# Patient Record
Sex: Female | Born: 1935 | Race: Black or African American | Hispanic: No | Smoking: Never smoker
Health system: Southern US, Community
[De-identification: ages and names within clinical notes are randomized; demographics above are authoritative.]

## PROBLEM LIST (undated history)

## (undated) DIAGNOSIS — F329 Major depressive disorder, single episode, unspecified: Secondary | ICD-10-CM

## (undated) DIAGNOSIS — M109 Gout, unspecified: Secondary | ICD-10-CM

## (undated) DIAGNOSIS — G8929 Other chronic pain: Secondary | ICD-10-CM

## (undated) DIAGNOSIS — I482 Chronic atrial fibrillation, unspecified: Secondary | ICD-10-CM

## (undated) DIAGNOSIS — K219 Gastro-esophageal reflux disease without esophagitis: Secondary | ICD-10-CM

## (undated) DIAGNOSIS — D649 Anemia, unspecified: Secondary | ICD-10-CM

## (undated) DIAGNOSIS — J449 Chronic obstructive pulmonary disease, unspecified: Secondary | ICD-10-CM

## (undated) DIAGNOSIS — I4891 Unspecified atrial fibrillation: Secondary | ICD-10-CM

## (undated) DIAGNOSIS — G9341 Metabolic encephalopathy: Secondary | ICD-10-CM

## (undated) DIAGNOSIS — N390 Urinary tract infection, site not specified: Secondary | ICD-10-CM

## (undated) DIAGNOSIS — N183 Chronic kidney disease, stage 3 unspecified: Secondary | ICD-10-CM

## (undated) DIAGNOSIS — T4145XA Adverse effect of unspecified anesthetic, initial encounter: Secondary | ICD-10-CM

## (undated) DIAGNOSIS — I10 Essential (primary) hypertension: Secondary | ICD-10-CM

## (undated) DIAGNOSIS — M545 Low back pain, unspecified: Secondary | ICD-10-CM

## (undated) DIAGNOSIS — K449 Diaphragmatic hernia without obstruction or gangrene: Secondary | ICD-10-CM

## (undated) DIAGNOSIS — M1990 Unspecified osteoarthritis, unspecified site: Secondary | ICD-10-CM

## (undated) DIAGNOSIS — F419 Anxiety disorder, unspecified: Secondary | ICD-10-CM

## (undated) DIAGNOSIS — G4733 Obstructive sleep apnea (adult) (pediatric): Secondary | ICD-10-CM

## (undated) DIAGNOSIS — W19XXXA Unspecified fall, initial encounter: Secondary | ICD-10-CM

## (undated) DIAGNOSIS — Z9989 Dependence on other enabling machines and devices: Secondary | ICD-10-CM

## (undated) DIAGNOSIS — S82209A Unspecified fracture of shaft of unspecified tibia, initial encounter for closed fracture: Secondary | ICD-10-CM

## (undated) DIAGNOSIS — E119 Type 2 diabetes mellitus without complications: Secondary | ICD-10-CM

## (undated) DIAGNOSIS — J90 Pleural effusion, not elsewhere classified: Secondary | ICD-10-CM

## (undated) DIAGNOSIS — I5032 Chronic diastolic (congestive) heart failure: Secondary | ICD-10-CM

## (undated) DIAGNOSIS — I509 Heart failure, unspecified: Secondary | ICD-10-CM

## (undated) DIAGNOSIS — N19 Unspecified kidney failure: Secondary | ICD-10-CM

## (undated) DIAGNOSIS — F32A Depression, unspecified: Secondary | ICD-10-CM

## (undated) DIAGNOSIS — Z9289 Personal history of other medical treatment: Secondary | ICD-10-CM

## (undated) DIAGNOSIS — J9690 Respiratory failure, unspecified, unspecified whether with hypoxia or hypercapnia: Secondary | ICD-10-CM

## (undated) DIAGNOSIS — T8859XA Other complications of anesthesia, initial encounter: Secondary | ICD-10-CM

## (undated) HISTORY — DX: Essential (primary) hypertension: I10

## (undated) HISTORY — DX: Chronic kidney disease, stage 3 (moderate): N18.3

## (undated) HISTORY — DX: Dependence on other enabling machines and devices: Z99.89

## (undated) HISTORY — PX: JOINT REPLACEMENT: SHX530

## (undated) HISTORY — DX: Unspecified atrial fibrillation: I48.91

## (undated) HISTORY — DX: Diaphragmatic hernia without obstruction or gangrene: K44.9

## (undated) HISTORY — PX: BUNIONECTOMY: SHX129

## (undated) HISTORY — PX: CHOLECYSTECTOMY: SHX55

## (undated) HISTORY — DX: Unspecified kidney failure: N19

## (undated) HISTORY — DX: Chronic kidney disease, stage 3 unspecified: N18.30

## (undated) HISTORY — DX: Obstructive sleep apnea (adult) (pediatric): G47.33

## (undated) HISTORY — DX: Gastro-esophageal reflux disease without esophagitis: K21.9

---

## 1999-11-05 HISTORY — PX: TOTAL KNEE ARTHROPLASTY: SHX125

## 2000-11-04 HISTORY — PX: SHOULDER OPEN ROTATOR CUFF REPAIR: SHX2407

## 2000-11-04 HISTORY — PX: SHOULDER ARTHROSCOPY W/ ROTATOR CUFF REPAIR: SHX2400

## 2002-11-04 HISTORY — PX: HAMMER TOE SURGERY: SHX385

## 2007-10-01 ENCOUNTER — Ambulatory Visit: Payer: Self-pay | Admitting: Cardiology

## 2007-10-09 ENCOUNTER — Ambulatory Visit: Payer: Self-pay | Admitting: Cardiology

## 2007-10-14 ENCOUNTER — Ambulatory Visit: Payer: Self-pay | Admitting: Cardiology

## 2007-10-22 ENCOUNTER — Ambulatory Visit: Payer: Self-pay | Admitting: Cardiology

## 2007-11-10 ENCOUNTER — Ambulatory Visit: Payer: Self-pay | Admitting: Cardiology

## 2007-11-17 ENCOUNTER — Encounter: Payer: Self-pay | Admitting: Pulmonary Disease

## 2007-12-03 ENCOUNTER — Ambulatory Visit: Payer: Self-pay | Admitting: Cardiology

## 2007-12-24 ENCOUNTER — Ambulatory Visit: Payer: Self-pay | Admitting: Cardiology

## 2008-01-21 ENCOUNTER — Ambulatory Visit: Payer: Self-pay | Admitting: Cardiology

## 2008-02-24 ENCOUNTER — Ambulatory Visit: Payer: Self-pay | Admitting: Cardiology

## 2008-04-05 ENCOUNTER — Ambulatory Visit: Payer: Self-pay | Admitting: Cardiology

## 2008-04-12 ENCOUNTER — Ambulatory Visit: Payer: Self-pay | Admitting: Cardiology

## 2008-04-26 ENCOUNTER — Ambulatory Visit: Payer: Self-pay | Admitting: Cardiology

## 2008-06-10 ENCOUNTER — Ambulatory Visit: Payer: Self-pay | Admitting: Cardiology

## 2008-06-28 ENCOUNTER — Ambulatory Visit: Payer: Self-pay | Admitting: Cardiology

## 2008-07-19 ENCOUNTER — Ambulatory Visit: Payer: Self-pay | Admitting: Cardiology

## 2008-07-20 ENCOUNTER — Ambulatory Visit: Payer: Self-pay | Admitting: Cardiology

## 2008-07-27 ENCOUNTER — Ambulatory Visit: Payer: Self-pay | Admitting: Cardiology

## 2008-08-11 ENCOUNTER — Ambulatory Visit: Payer: Self-pay | Admitting: Cardiology

## 2008-08-19 ENCOUNTER — Ambulatory Visit: Payer: Self-pay | Admitting: Pulmonary Disease

## 2008-08-19 DIAGNOSIS — E119 Type 2 diabetes mellitus without complications: Secondary | ICD-10-CM

## 2008-08-19 DIAGNOSIS — Z8679 Personal history of other diseases of the circulatory system: Secondary | ICD-10-CM

## 2008-08-19 DIAGNOSIS — K219 Gastro-esophageal reflux disease without esophagitis: Secondary | ICD-10-CM

## 2008-08-19 DIAGNOSIS — I10 Essential (primary) hypertension: Secondary | ICD-10-CM | POA: Insufficient documentation

## 2008-08-19 DIAGNOSIS — F329 Major depressive disorder, single episode, unspecified: Secondary | ICD-10-CM

## 2008-08-19 DIAGNOSIS — J309 Allergic rhinitis, unspecified: Secondary | ICD-10-CM | POA: Insufficient documentation

## 2008-08-31 DIAGNOSIS — G4700 Insomnia, unspecified: Secondary | ICD-10-CM

## 2008-08-31 DIAGNOSIS — G4733 Obstructive sleep apnea (adult) (pediatric): Secondary | ICD-10-CM | POA: Insufficient documentation

## 2008-09-09 ENCOUNTER — Ambulatory Visit: Payer: Self-pay | Admitting: Cardiology

## 2008-09-20 ENCOUNTER — Ambulatory Visit: Payer: Self-pay | Admitting: Pulmonary Disease

## 2008-09-23 ENCOUNTER — Ambulatory Visit: Payer: Self-pay | Admitting: Cardiology

## 2008-09-28 ENCOUNTER — Ambulatory Visit: Payer: Self-pay | Admitting: Cardiology

## 2008-10-13 ENCOUNTER — Telehealth (INDEPENDENT_AMBULATORY_CARE_PROVIDER_SITE_OTHER): Payer: Self-pay | Admitting: *Deleted

## 2008-10-18 ENCOUNTER — Ambulatory Visit: Payer: Self-pay | Admitting: Cardiology

## 2008-11-09 ENCOUNTER — Telehealth: Payer: Self-pay | Admitting: Pulmonary Disease

## 2008-11-15 ENCOUNTER — Ambulatory Visit: Payer: Self-pay | Admitting: Cardiology

## 2008-11-25 ENCOUNTER — Ambulatory Visit: Payer: Self-pay | Admitting: Cardiology

## 2008-12-14 ENCOUNTER — Ambulatory Visit: Payer: Self-pay | Admitting: Cardiology

## 2008-12-30 ENCOUNTER — Ambulatory Visit: Payer: Self-pay | Admitting: Cardiology

## 2009-01-20 ENCOUNTER — Ambulatory Visit: Payer: Self-pay | Admitting: Cardiology

## 2009-02-17 ENCOUNTER — Encounter: Payer: Self-pay | Admitting: Cardiology

## 2009-02-24 ENCOUNTER — Ambulatory Visit: Payer: Self-pay | Admitting: Cardiology

## 2009-03-20 ENCOUNTER — Encounter: Payer: Self-pay | Admitting: Cardiology

## 2009-05-04 ENCOUNTER — Emergency Department (HOSPITAL_COMMUNITY): Admission: EM | Admit: 2009-05-04 | Discharge: 2009-05-04 | Payer: Self-pay | Admitting: Emergency Medicine

## 2009-06-19 ENCOUNTER — Encounter: Payer: Self-pay | Admitting: *Deleted

## 2009-07-27 ENCOUNTER — Encounter (INDEPENDENT_AMBULATORY_CARE_PROVIDER_SITE_OTHER): Payer: Self-pay | Admitting: Cardiology

## 2011-03-19 NOTE — Assessment & Plan Note (Signed)
[facility]                          [facility] OFFICE NOTE   NAME:Gina Ashley, Gina Ashley                        MRN:          [id number]  DATE:10/22/2007                            DOB:          10-01-1927    REFERRING PHYSICIAN:  Lia Hopping   HISTORY OF PRESENT ILLNESS:  The patient is a 77 year old female  recently admitted with viral gastroenteritis.  She developed atrial  fibrillation, was seen by Dr. Jens Som.  She has a history of paroxysmal  atrial fibrillation and was previously on sotalol.  The patient was  started on Coumadin during this hospitalization.  She was also given  Cardizem but I discontinued this due to her first degree AV block and  sinus bradycardia and did not want to combine this with a beta-blocker.  She is now, however, on amlodipine in the meanwhile for blood pressure  control.  She is doing quite well.  She has no chest pain, shortness of  breath, orthopnea and PND; she has no other complaints.  She is here  also to have her INR checked.   MEDICATIONS:  1. Sotalol 120 mg p.o. b.i.d.  2. Nexium 40 mg p.o. daily.  3. Lasix 40 mg p.o. daily.  4. Glucotrol 10 mg p.o. b.i.d.  5. Glucophage 500 mg p.o. b.i.d.  6. KCL 20 mg p.o. daily.  7. Zestril 20 mg p.o. daily.  8. Actonel 35 mg p.o. daily.  9. Detrol LA 4 mg p.o. daily.  10.Aspirin 81 mg p.o. daily.  11.Vicodin 5 mg p.o. b.i.d.  12.Coumadin as directed.  13.Lortab b.i.d.  14.Amlodipine 5 mg p.o. b.i.d.  15.INR is 2.   PHYSICAL EXAMINATION:  VITAL SIGNS:  Blood pressure is 138/61, heart  rate 65, weight is 220 pounds.  NECK EXAM:  Normal carotid upstroke, no carotid bruit.  LUNGS:  Clear.  HEART:  Regular rate and rhythm, normal S1 and S2.  ABDOMEN:  Soft.  EXTREMITY EXAM:  No cyanosis, clubbing or edema.  NEURO:  The patient alert, oriented, grossly nonfocal.   PROBLEMS:  1. Paroxysmal atrial fibrillation, normal sinus rhythm.  2. Chronic sotalol therapy.  3. Coumadin anticoagulation.  4. History of first degree atrioventricular block and sinus      bradycardia in combination with beta-blocker therapy, this has now      resolved, although there is still a prolonged PR.  5. Normal left ventricular function.  6. CHADS score of 3.   PLAN:  1. The patient can follow up with [place] in 6 months.  2. The patient will have an INR done today and if in therapeutic range      she can be extended to q.monthly.  3. No further ischemia workup is currently needed.     Learta Codding, MD,FACC  Electronically Signed    GED/MedQ  DD: 10/22/2007  DT: 10/23/2007  Job #: [id number]   cc:   Lia Hopping

## 2011-03-19 NOTE — Assessment & Plan Note (Signed)
[facility]                          [facility] OFFICE NOTE   NAME:Gina Ashley, KEYARI                        MRN:          [id number]  DATE:08/11/2008                            DOB:          06-12-1927    REFERRING PHYSICIAN:  Lia Hopping   REFERRING PHYSICIAN:  __________   HISTORY OF PRESENT ILLNESS:  The patient is a 77 year old female with a  history of paroxysmal atrial fibrillation on sotalol therapy.  She has  remained in normal sinus rhythm.  The patient also has severe sleep  apnea, for which she uses CPAP mask.  Unfortunately, the patient has  difficulty with the CPAP mask, and is unable to sleep no more than 2 to  at most 3 hours at night.  She suffered the rest of the night and is  very restless.  She also has extremely dry mouth and feels she cannot  tolerate the mask.  I have asked her what happens when she takes off the  mask, and she states that she done, she becomes extremely restless with  a sensation of thrashing around as well as vivid dreams and has, scared  feelings during the nighttime.  From a cardiovascular perspective,  however, the patient has remained stable.  Her blood pressure  unfortunately remains uncontrolled.   MEDICATIONS:  1. Sotalol 120 mg p.o. b.i.d.  2. Nexium 40 mg p.o. daily.  3. Lasix 40 mg 1 tablet in the morning and half in the evening.  4. Glucotrol 10 mg 1 tablet twice a day.  5. Glucophage XR 5 mg 2 tablets in the morning, 2 in the evening.  6. K-Dur 20 mEq 1 tablet twice a day.  7. Calcium with vitamin D.  8. Vitron-C.  9. Lisinopril 20 mg a day.  10.Amlodipine 5 mg twice a day.  11.Actonel.  12.Hydrocodone and warfarin as directed.   PHYSICAL EXAMINATION:  VITAL SIGNS:  Blood pressure is 174/83, heart  rate 69, weight is 224 pounds.  NECK:  Normal carotid upstroke, no carotid bruits.  LUNGS:  Clear breath sounds bilaterally.  HEART:  Regular rate and rhythm with a normal S1 and S2.  No  murmur,  rubs, or gallops.  ABDOMEN:  Soft, nontender.  No rebound or guarding.  Good bowel sounds.  EXTREMITIES:  No cyanosis, clubbing, or edema.  NEUROLOGIC:  The patient is alert, oriented grossly nonfocal.   PROBLEM LIST:  1. Paroxysmal atrial fibrillation.  Normal sinus rhythm on sotalol      therapy.  2. Chronic Coumadin therapy.  3. History of sinus bradycardia.  4. Normal left ventricular function.  5. CHADS score of 3.  6. Obstructive sleep apnea, poorly tolerated.  7. Hypertension, poorly controlled.   PLAN:  1. We will increase the patient's Lasix to 40 mg p.o. b.i.d.  She also      has some lower extremity edema.  2. Additionally, I increased the patient's lisinopril to 40 mg p.o. q.      daily.  She will also follow up blood pressure monitoring next      week. We will  check BMET together with an RN visit for a blood      pressure check.  3. I talked with Dr. Graciela Husbands about this patient.  I will refer her for      CPAP, but desensitization due to her difficulty that she is      encountering.     Learta Codding, MD,FACC  Electronically Signed    GED/MedQ  DD: 08/11/2008  DT: 08/11/2008  Job #: [id number]   cc:   Lia Hopping

## 2011-05-20 ENCOUNTER — Encounter (INDEPENDENT_AMBULATORY_CARE_PROVIDER_SITE_OTHER): Payer: Self-pay

## 2011-05-21 DIAGNOSIS — R0602 Shortness of breath: Secondary | ICD-10-CM

## 2011-05-21 DIAGNOSIS — I498 Other specified cardiac arrhythmias: Secondary | ICD-10-CM

## 2011-05-22 DIAGNOSIS — R072 Precordial pain: Secondary | ICD-10-CM

## 2011-05-22 DIAGNOSIS — I4891 Unspecified atrial fibrillation: Secondary | ICD-10-CM

## 2011-06-03 ENCOUNTER — Ambulatory Visit (INDEPENDENT_AMBULATORY_CARE_PROVIDER_SITE_OTHER): Payer: Medicare Other | Admitting: Internal Medicine

## 2011-06-03 ENCOUNTER — Encounter (INDEPENDENT_AMBULATORY_CARE_PROVIDER_SITE_OTHER): Payer: Self-pay | Admitting: Internal Medicine

## 2011-06-03 ENCOUNTER — Telehealth (INDEPENDENT_AMBULATORY_CARE_PROVIDER_SITE_OTHER): Payer: Self-pay | Admitting: *Deleted

## 2011-06-03 ENCOUNTER — Encounter (INDEPENDENT_AMBULATORY_CARE_PROVIDER_SITE_OTHER): Payer: Self-pay | Admitting: *Deleted

## 2011-06-03 VITALS — BP 142/58 | HR 64 | Temp 98.0°F | Ht 63.0 in | Wt 208.2 lb

## 2011-06-03 DIAGNOSIS — K635 Polyp of colon: Secondary | ICD-10-CM

## 2011-06-03 DIAGNOSIS — D649 Anemia, unspecified: Secondary | ICD-10-CM

## 2011-06-03 DIAGNOSIS — R195 Other fecal abnormalities: Secondary | ICD-10-CM

## 2011-06-03 DIAGNOSIS — Z800 Family history of malignant neoplasm of digestive organs: Secondary | ICD-10-CM

## 2011-06-03 DIAGNOSIS — D126 Benign neoplasm of colon, unspecified: Secondary | ICD-10-CM

## 2011-06-03 NOTE — Patient Instructions (Signed)
All questions were answered. The risks and benefits such as perforation, bleeding, and infection were reviewed with the patient and is agreeable.

## 2011-06-03 NOTE — Telephone Encounter (Signed)
Pt needs movi prep escribed  TCS sch'd 07/11/11 @ 3:00 (2:00), coumadin 5 days, iron 10 days  Pt given split dose movi prep

## 2011-06-03 NOTE — Progress Notes (Signed)
Subjective:     Patient ID: Gina Ashley, female   DOB: 12-09-1926, 77 y.o.   MRN: [id number]  HPI  Gina Ashley is an 77 yr old female referred to our office by Dr. Ernestine Conrad for heme positive stools.  Appetite is good. No weight loss. No dysphagia. She c/o left lower abdominal pain which she has had for over 20 yrs.  No rectal bleeding or melena. Stools are dark and normal size.  She is presently taking Vitron C 1 tab BID for anemia.  I could not list under medications.  She has a BM one to two a week  Gina Ashley was recently in the hospital at [facility] 05/22/11 for weakness, fatigue with associated bradycardia.  Admission Hemoglobin 10.7, hematocrit 32.8, MCV 93.5 PT/INR 26.3 and 2.4 on coumadin.Lexican Carioloyte study was negative for inducible ischemia. 2D echo showed an EF of 60-65%, normal LV function but with evidence of of significant pulmonary hypertension with an RV systolic pressure estimated at 65-71mm.   08/21/2007 Dr. Linna Darner Colonoscopy for surveillance, personal hx of colonic polyps. No diverticulosis. No colonic or rectal polyps. Retroflexed in the rectum revealed no abnormalities.  She tells me that she had a colonoscopy in 2010 by Dr. Linna Darner but I am unable to locate that report. I have called [facility] and talked with Diane and she says  It does not exist.  Review of Systems see hpi     Objective:   Physical Exam    Alert and oriented. Skin warm and dry. Oral mucosa is moist.  Upper dentures. Natural lower in fair condition. Sclera anicteric, conjunctivae is pink. Thyroid not enlarged. No cervical lymphadenopathy. Lungs clear. Heart regular rate and rhythm.  Abdomen is soft. Bowel sounds are positive. No hepatomegaly. No abdominal masses felt. No tenderness.Stool brown and guaiac positive.  1+ edema to lower extremities with compression hose in place.. Patient is alert and oriented.                                 Assessment:    Heme positive stools, anemia, family hx of colon cancer in a 1st  degree relative.  (brothers x 2).  Colnic neoplasm, polyp, AVM, hemorrhoid needs to be ruled out.      Plan:     Will schedule a colonoscopy with Dr. Karilyn Cota.    The risks and benefits such as perforation, bleeding, and infection were reviewed with the patient and is agreeable.

## 2011-06-04 MED ORDER — PEG-KCL-NACL-NASULF-NA ASC-C 100 G PO SOLR: 1.0000 | Freq: Once | ORAL | Status: DC

## 2011-06-05 ENCOUNTER — Encounter: Payer: Self-pay | Admitting: Cardiovascular Disease

## 2011-06-14 ENCOUNTER — Encounter: Payer: Self-pay | Admitting: Cardiovascular Disease

## 2011-06-14 ENCOUNTER — Ambulatory Visit (INDEPENDENT_AMBULATORY_CARE_PROVIDER_SITE_OTHER): Payer: Medicare Other | Admitting: Cardiovascular Disease

## 2011-06-14 VITALS — BP 146/83 | HR 58 | Resp 18 | Ht 65.0 in | Wt 212.8 lb

## 2011-06-14 DIAGNOSIS — I480 Paroxysmal atrial fibrillation: Secondary | ICD-10-CM | POA: Insufficient documentation

## 2011-06-14 DIAGNOSIS — I10 Essential (primary) hypertension: Secondary | ICD-10-CM | POA: Insufficient documentation

## 2011-06-14 DIAGNOSIS — I272 Pulmonary hypertension, unspecified: Secondary | ICD-10-CM | POA: Insufficient documentation

## 2011-06-14 DIAGNOSIS — I4891 Unspecified atrial fibrillation: Secondary | ICD-10-CM

## 2011-06-14 NOTE — Assessment & Plan Note (Signed)
Her echocardiogram showed evidence of moderate to severe pulmonary hypertension. I suspect that this is likely due to prolonged history of diastolic dysfunction as well as systemic hypertension. She does not seem to be significantly symptomatic. Given her age and minimal symptoms, I will not per pursue further evaluation of this.

## 2011-06-14 NOTE — Patient Instructions (Signed)
Continue all current medications. Your physician wants you to follow up in: 6 months.  You will receive a reminder letter in the mail one-two months in advance.  If you don't receive a letter, please call our office to schedule the follow up appointment   

## 2011-06-14 NOTE — Progress Notes (Signed)
HPI  This is an 77 year old female who is here today for a followup visit. She has prolonged history of atrial fibrillation as well as hypertension. She has been treated with sotalol as well as long-term anticoagulation with warfarin. She has been maintaining mostly in sinus rhythm. She was hospitalized recently at [facility] due to fatigue, dizziness and dyspnea. She was found to have significant bradycardia with heart rate occasionally going down to the 30s. She was taking sotalol 120 mg twice daily. She had gradual decline in her renal function and it was felt that the sotalol dose needed to be adjusted due to her renal failure. Thus, the dose was decreased to 40 mg twice daily. Her bradycardia resolved. She had no recurrent atrial fibrillation. She had an echocardiogram done which showed normal LV systolic function with evidence of moderate to severe pulmonary hypertension without significant valvular abnormalities. She did undergo a pharmacologic nuclear stress test which showed no evidence of ischemia.  Allergies  Allergen Reactions  . Ferrous Sulfate     REACTION: nausea  . Morphine   . Morphine Sulfate     REACTION: unspecified: change in personality  . Penicillins     REACTION: rash  . Percocet (Oxycodone-Acetaminophen)      Current Outpatient Prescriptions on File Prior to Visit  Medication Sig Dispense Refill  . amLODipine (NORVASC) 10 MG tablet Take 10 mg by mouth daily.        . cholestyramine (QUESTRAN) 4 GM/DOSE powder Take by mouth 3 (three) times daily with meals.        . cloNIDine (CATAPRES) 0.2 MG tablet Take 0.2 mg by mouth 2 (two) times daily.        . clotrimazole-betamethasone (LOTRISONE) cream Apply topically 2 (two) times daily.        . Cyanocobalamin (VITAMIN B 12 PO) Take by mouth.        . fish oil-omega-3 fatty acids 1000 MG capsule Take 2,400 mg by mouth daily.        . furosemide (LASIX) 80 MG tablet Take 40 mg by mouth daily.       Marland Kitchen gabapentin  (NEURONTIN) 300 MG capsule Take 300 mg by mouth 2 (two) times daily.       Marland Kitchen glipiZIDE (GLUCOTROL XL) 10 MG 24 hr tablet Take 10 mg by mouth daily.        Marland Kitchen HYDROcodone-acetaminophen (LORTAB) 10-500 MG per tablet Take 1 tablet by mouth every 6 (six) hours as needed.        Marland Kitchen lisinopril (PRINIVIL,ZESTRIL) 40 MG tablet Take 40 mg by mouth daily.        . meclizine (ANTIVERT) 50 MG tablet Take 25 mg by mouth as needed.        Marland Kitchen omeprazole (PRILOSEC) 40 MG capsule Take 40 mg by mouth daily.        . peg 3350 powder (MOVIPREP) 100 G SOLR Take 1 kit (100 g total) by mouth once.  1 kit  0  . potassium chloride (K-DUR) 10 MEQ tablet Take 10 mEq by mouth 2 (two) times daily.        . sotalol (BETAPACE) 120 MG tablet Take 40 mg by mouth 2 (two) times daily.       . traZODone (DESYREL) 50 MG tablet Take 50 mg by mouth at bedtime as needed.       . warfarin (COUMADIN) 5 MG tablet Take 5 mg by mouth daily.  Past Medical History  Diagnosis Date  . Diabetes mellitus     x 15 yrs  . GERD (gastroesophageal reflux disease)   . OSA on CPAP   . Back pain, chronic   . Pain in shoulder   . Pain in ankle   . Hiatal hernia   . Atrial fibrillation     since 1996  . HTN (hypertension)     all her life  . Renal failure      Past Surgical History  Procedure Date  . Bunionectomy     bilateral  . Cholecystectomy   . Rotator cuff repair     2002  . Total knee arthroplasty     bilateral 2001  . Hammer toe surgery     2004     History reviewed. No pertinent family history.   History   Social History  . Marital Status: Widowed    Spouse Name: N/A    Number of Children: N/A  . Years of Education: N/A   Occupational History  . Not on file.   Social History Main Topics  . Smoking status: Never Smoker   . Smokeless tobacco: Not on file  . Alcohol Use: No  . Drug Use: No  . Sexually Active: Not on file   Other Topics Concern  . Not on file   Social History Narrative  . No  narrative on file       PHYSICAL EXAM   BP 146/83  Pulse 58  Resp 18  Ht 5\' 5"  (1.651 m)  Wt 212 lb 12.8 oz (96.525 kg)  BMI 35.41 kg/m2  SpO2 90%  Constitutional: She is oriented to person, place, and time. She appears well-developed and well-nourished. No distress.  HENT: No nasal discharge.  Head: Normocephalic and atraumatic.  Eyes: Pupils are equal, round, and reactive to light. Right eye exhibits no discharge. Left eye exhibits no discharge.  Neck: Normal range of motion. Neck supple. No JVD present. No thyromegaly present.  Cardiovascular: Normal rate, regular rhythm, normal heart sounds and intact distal pulses. Exam reveals no gallop and no friction rub.  No murmur heard.  Pulmonary/Chest: Effort normal and breath sounds normal. No stridor. No respiratory distress. She has no wheezes. She has no rales. She exhibits no tenderness.  Abdominal: Soft. Bowel sounds are normal. She exhibits no distension. There is no tenderness. There is no rebound and no guarding.  Musculoskeletal: Normal range of motion. She exhibits +1 edema and no tenderness.  Neurological: She is alert and oriented to person, place, and time. Coordination normal.  Skin: Skin is warm and dry. No rash noted. She is not diaphoretic. No erythema. No pallor.  Psychiatric: She has a normal mood and affect. Her behavior is normal. Judgment and thought content normal.      ASSESSMENT AND PLAN

## 2011-06-14 NOTE — Assessment & Plan Note (Addendum)
The patient has persistent atrial fibrillation but has maintained mostly in sinus rhythm with sotalol. The bradycardia almost resolved after decreasing the dose to 40 mg twice daily. I reviewed her heart rate and blood pressure readings at home. The lowest heart rate was 47 beats per minute. Most of the time it's ranging from 55-70.  Continue current dose of sotalol as well as long-term anticoagulation with a goal INR between 2 and 3. This is being managed by Dr. Loney Hering.

## 2011-06-14 NOTE — Assessment & Plan Note (Signed)
Her blood pressure is reasonably controlled. Continue current medications. 

## 2011-07-10 MED ORDER — SODIUM CHLORIDE 0.45 % IV SOLN
Freq: Once | INTRAVENOUS | Status: AC
  Administered 2011-07-11: 14:00:00 via INTRAVENOUS

## 2011-07-11 ENCOUNTER — Encounter (HOSPITAL_COMMUNITY)
Admission: RE | Disposition: A | Discharge status: Home / Self Care | Payer: Self-pay | Source: Ambulatory Visit | Attending: Internal Medicine

## 2011-07-11 ENCOUNTER — Ambulatory Visit (HOSPITAL_COMMUNITY)
Admission: RE | Admit: 2011-07-11 | Discharge: 2011-07-11 | Disposition: A | Discharge status: Home / Self Care | Payer: Medicare Other | Source: Ambulatory Visit | Attending: Internal Medicine | Admitting: Internal Medicine

## 2011-07-11 ENCOUNTER — Encounter (HOSPITAL_COMMUNITY): Payer: Self-pay | Admitting: *Deleted

## 2011-07-11 ENCOUNTER — Other Ambulatory Visit (INDEPENDENT_AMBULATORY_CARE_PROVIDER_SITE_OTHER): Payer: Self-pay | Admitting: Internal Medicine

## 2011-07-11 DIAGNOSIS — Z01812 Encounter for preprocedural laboratory examination: Secondary | ICD-10-CM | POA: Insufficient documentation

## 2011-07-11 DIAGNOSIS — D126 Benign neoplasm of colon, unspecified: Secondary | ICD-10-CM | POA: Insufficient documentation

## 2011-07-11 DIAGNOSIS — Z800 Family history of malignant neoplasm of digestive organs: Secondary | ICD-10-CM | POA: Insufficient documentation

## 2011-07-11 DIAGNOSIS — Z79899 Other long term (current) drug therapy: Secondary | ICD-10-CM | POA: Insufficient documentation

## 2011-07-11 DIAGNOSIS — K921 Melena: Secondary | ICD-10-CM

## 2011-07-11 DIAGNOSIS — Z860100 Personal history of colon polyps, unspecified: Secondary | ICD-10-CM | POA: Insufficient documentation

## 2011-07-11 DIAGNOSIS — I4891 Unspecified atrial fibrillation: Secondary | ICD-10-CM | POA: Insufficient documentation

## 2011-07-11 DIAGNOSIS — K5730 Diverticulosis of large intestine without perforation or abscess without bleeding: Secondary | ICD-10-CM | POA: Insufficient documentation

## 2011-07-11 DIAGNOSIS — G4733 Obstructive sleep apnea (adult) (pediatric): Secondary | ICD-10-CM | POA: Insufficient documentation

## 2011-07-11 DIAGNOSIS — E119 Type 2 diabetes mellitus without complications: Secondary | ICD-10-CM | POA: Insufficient documentation

## 2011-07-11 HISTORY — DX: Unspecified fall, initial encounter: W19.XXXA

## 2011-07-11 HISTORY — PX: COLONOSCOPY: SHX5424

## 2011-07-11 SURGERY — COLONOSCOPY
Anesthesia: Moderate Sedation

## 2011-07-11 MED ORDER — MEPERIDINE HCL 50 MG/ML IJ SOLN
INTRAMUSCULAR | Status: DC | PRN
  Administered 2011-07-11: 25 mg via INTRAVENOUS

## 2011-07-11 MED ORDER — MIDAZOLAM HCL 5 MG/5ML IJ SOLN
INTRAMUSCULAR | Status: AC
  Filled 2011-07-11: qty 10

## 2011-07-11 MED ORDER — MEPERIDINE HCL 50 MG/ML IJ SOLN
INTRAMUSCULAR | Status: AC
  Filled 2011-07-11: qty 1

## 2011-07-11 MED ORDER — MIDAZOLAM HCL 5 MG/5ML IJ SOLN
INTRAMUSCULAR | Status: DC | PRN
  Administered 2011-07-11: 2 mg via INTRAVENOUS
  Administered 2011-07-11: 1 mg via INTRAVENOUS

## 2011-07-11 MED ORDER — STERILE WATER FOR IRRIGATION IR SOLN
Status: DC | PRN
  Administered 2011-07-11: 15:00:00

## 2011-07-11 NOTE — Op Note (Signed)
COLONOSCOPY PROCEDURE REPORT  PATIENT:  Gina Ashley  MR#:  [id number] Birthdate:  [birth year], 77 y.o., female Endoscopist:  Dr. Malissa Hippo, MD Referred By:  Dr. Ernestine Conrad, MD Procedure Date: 07/11/2011  Procedure:   Colonoscopy with snare polypectomy.  Indications: Heme positive stools. History of colonic polyps and family history of colon carcinoma.  Informed Consent: Procedure and risks were reviewed with the patient. Her questions were answered. Informed consent was obtained. Medications:  Demerol 25 mg IV Versed 3 mg IV  Description of procedure:  After a digital rectal exam was performed, that colonoscope was advanced from the anus through the rectum and colon to the area of the cecum, ileocecal valve and appendiceal orifice. The cecum was deeply intubated. These structures were well-seen and photographed for the record. From the level of the cecum and ileocecal valve, the scope was slowly and cautiously withdrawn. The mucosal surfaces were carefully surveyed utilizing scope tip to flexion to facilitate fold flattening as needed. The scope was pulled down into the rectum where a thorough exam including retroflexion was performed.  Findings:   Prep excellent. Few small diverticula at sigmoid colon and one at ascending colon. 8 mm polyp snared from proximal transverse colon. 2 polyps snared from mid transverse colon located close to each other measuring approximately 7 and 5 mm. Both of these polyps were submitted in one container.  Therapeutic/Diagnostic Maneuvers Performed:  See above  Complications:  On  Cecal Withdrawal Time:  14 minutes  Impression:  Examination performed to cecum. 8 mm polyp snared from proximal transverse colon 2 small polyps snared from mid transverse colon and submitted in one container. Few small diverticula at sigmoid and one at ascending colon.  Recommendations:  Resume warfarin at usual dose starting on 07/12/2011. Resume other medications  as before. No aspirin for one week. I will be contacting patient with results of biopsy.   Glade Strausser U  07/11/2011 3:20 PM  CC: Dr. Ernestine Conrad, MD

## 2011-07-11 NOTE — H&P (Signed)
Gina Ashley is an 77 y.o. female.   Chief Complaint: Patient is here for colonoscopy HPI: Patient is a 74-year-old African female with history of colonic polyps with last exam was 4 years ago was noted to have heme-positive stools recently and therefore undergoing this exam. She denies rectal bleeding change in her bowel habits or abdominal pain. She has good appetite but she's trying to lose weight. She may have lost 5 pounds this year. History is positive for a colon carcinoma in a brother at age 69 and he lived to be 51.  Past Medical History  Diagnosis Date  . Diabetes mellitus     x 15 yrs  . GERD (gastroesophageal reflux disease)   . Back pain, chronic   . Pain in shoulder   . Pain in ankle   . Hiatal hernia   . HTN (hypertension)     all her life  . CKD (chronic kidney disease), stage III     GFR: 38  . Atrial fibrillation     since 1996  . Sleep apnea   . Fall 3 weeks ago    was [facility]  . OSA on CPAP     Past Surgical History  Procedure Date  . Bunionectomy     bilateral  . Cholecystectomy   . Rotator cuff repair     2002  . Total knee arthroplasty     bilateral 2001  . Hammer toe surgery     2004    Family History  Problem Relation Age of Onset  . Colon cancer Brother    Social History:  reports that she has never smoked. She does not have any smokeless tobacco history on file. She reports that she does not drink alcohol or use illicit drugs.  Allergies:  Allergies  Allergen Reactions  . Morphine   . Morphine Sulfate     REACTION: unspecified: change in personality  . Penicillins     REACTION: rash  . Percocet (Oxycodone-Acetaminophen)     Does not relieve the pain    Medications Prior to Admission  Medication Dose Route Frequency Provider Last Rate Last Dose  . 0.45 % sodium chloride infusion   Intravenous Once Malissa Hippo, MD 20 mL/hr at 07/11/11 1413    . meperidine (DEMEROL) 50 MG/ML injection           . midazolam  (VERSED) 5 MG/5ML injection            Medications Prior to Admission  Medication Sig Dispense Refill  . amLODipine (NORVASC) 10 MG tablet Take 10 mg by mouth daily.        . cloNIDine (CATAPRES) 0.2 MG tablet Take 0.2 mg by mouth 2 (two) times daily.        . furosemide (LASIX) 80 MG tablet Take 40 mg by mouth daily.       Marland Kitchen gabapentin (NEURONTIN) 300 MG capsule Take 300 mg by mouth 2 (two) times daily.       Marland Kitchen glipiZIDE (GLUCOTROL XL) 10 MG 24 hr tablet Take 10 mg by mouth daily.        Marland Kitchen HYDROcodone-acetaminophen (LORTAB) 10-500 MG per tablet Take 1 tablet by mouth every 6 (six) hours as needed.        Marland Kitchen lisinopril (PRINIVIL,ZESTRIL) 40 MG tablet Take 40 mg by mouth daily.        Marland Kitchen omeprazole (PRILOSEC) 40 MG capsule Take 40 mg by mouth daily.        Marland Kitchen  peg 3350 powder (MOVIPREP) 100 G SOLR Take 1 kit (100 g total) by mouth once.  1 kit  0  . potassium chloride (K-DUR) 10 MEQ tablet Take 10 mEq by mouth 2 (two) times daily.        . sotalol (BETAPACE) 120 MG tablet Take 40 mg by mouth 2 (two) times daily.       . traZODone (DESYREL) 50 MG tablet Take 50 mg by mouth at bedtime as needed.       . cholestyramine (QUESTRAN) 4 GM/DOSE powder Take by mouth 3 (three) times daily with meals.        . clotrimazole-betamethasone (LOTRISONE) cream Apply topically 2 (two) times daily.        . Cyanocobalamin (VITAMIN B 12 PO) Take by mouth.        . warfarin (COUMADIN) 5 MG tablet Take 5 mg by mouth daily.          Results for orders placed during the hospital encounter of 07/11/11 (from the past 48 hour(s))  GLUCOSE, CAPILLARY     Status: Abnormal   Collection Time   07/11/11  2:14 PM      Component Value Range Comment   Glucose-Capillary 227 (*) 70 - 99 (mg/dL)    No results found.  Review of Systems  Constitutional: Negative for weight loss.  Gastrointestinal: Negative for abdominal pain, diarrhea, constipation, blood in stool and melena.    Blood pressure 174/92, pulse 84, temperature  98.4 F (36.9 C), temperature source Oral, resp. rate 20, height 5\' 3"  (1.6 m), weight 208 lb (94.348 kg), SpO2 95.00%. Physical Exam  Constitutional: She appears well-nourished.  HENT:  Mouth/Throat: Oropharynx is clear and moist.  Eyes: Conjunctivae are normal. No scleral icterus.  Neck: No thyromegaly present.  Cardiovascular: Normal rate and normal heart sounds.   Murmur: faint systolic ejection murmur best heard at LLSB.       irregular rhythm  Respiratory: Effort normal and breath sounds normal.  GI: Soft. She exhibits no distension and no mass. There is no tenderness.  Musculoskeletal: She exhibits no edema.  Lymphadenopathy:    She has no cervical adenopathy.  Neurological: She is alert.  Skin: Skin is warm and dry.     Assessment/Plan Heme positive stools. History of colonic polyps and family history of colon carcinoma  Gina Ashley U 07/11/2011, 2:45 PM

## 2011-07-17 ENCOUNTER — Encounter (HOSPITAL_COMMUNITY): Payer: Self-pay | Admitting: Internal Medicine

## 2011-07-17 ENCOUNTER — Encounter (INDEPENDENT_AMBULATORY_CARE_PROVIDER_SITE_OTHER): Payer: Self-pay | Admitting: *Deleted

## 2011-08-20 ENCOUNTER — Ambulatory Visit: Payer: Medicare Other | Admitting: Cardiology

## 2011-12-11 DIAGNOSIS — I5033 Acute on chronic diastolic (congestive) heart failure: Secondary | ICD-10-CM

## 2011-12-11 DIAGNOSIS — R0602 Shortness of breath: Secondary | ICD-10-CM

## 2011-12-12 DIAGNOSIS — I4891 Unspecified atrial fibrillation: Secondary | ICD-10-CM

## 2011-12-13 ENCOUNTER — Ambulatory Visit: Payer: Medicare Other | Admitting: Cardiovascular Disease

## 2012-01-03 ENCOUNTER — Ambulatory Visit: Payer: Medicare Other | Admitting: Cardiovascular Disease

## 2012-05-22 DIAGNOSIS — I509 Heart failure, unspecified: Secondary | ICD-10-CM

## 2012-05-22 DIAGNOSIS — I5031 Acute diastolic (congestive) heart failure: Secondary | ICD-10-CM

## 2012-06-14 ENCOUNTER — Emergency Department (HOSPITAL_COMMUNITY): Payer: Medicare Other

## 2012-06-14 ENCOUNTER — Other Ambulatory Visit: Payer: Self-pay

## 2012-06-14 ENCOUNTER — Encounter (HOSPITAL_COMMUNITY): Payer: Self-pay | Admitting: *Deleted

## 2012-06-14 ENCOUNTER — Inpatient Hospital Stay (HOSPITAL_COMMUNITY)
Admission: EM | Admit: 2012-06-14 | Discharge: 2012-06-16 | DRG: 641 | Disposition: A | Discharge status: Home / Self Care | Payer: Medicare Other | Attending: Internal Medicine | Admitting: Internal Medicine

## 2012-06-14 DIAGNOSIS — N289 Disorder of kidney and ureter, unspecified: Secondary | ICD-10-CM

## 2012-06-14 DIAGNOSIS — G4733 Obstructive sleep apnea (adult) (pediatric): Secondary | ICD-10-CM

## 2012-06-14 DIAGNOSIS — R739 Hyperglycemia, unspecified: Secondary | ICD-10-CM

## 2012-06-14 DIAGNOSIS — K219 Gastro-esophageal reflux disease without esophagitis: Secondary | ICD-10-CM | POA: Diagnosis present

## 2012-06-14 DIAGNOSIS — R5383 Other fatigue: Secondary | ICD-10-CM

## 2012-06-14 DIAGNOSIS — E119 Type 2 diabetes mellitus without complications: Secondary | ICD-10-CM | POA: Diagnosis present

## 2012-06-14 DIAGNOSIS — E871 Hypo-osmolality and hyponatremia: Principal | ICD-10-CM

## 2012-06-14 DIAGNOSIS — E860 Dehydration: Secondary | ICD-10-CM

## 2012-06-14 DIAGNOSIS — J9610 Chronic respiratory failure, unspecified whether with hypoxia or hypercapnia: Secondary | ICD-10-CM | POA: Diagnosis present

## 2012-06-14 DIAGNOSIS — Z79899 Other long term (current) drug therapy: Secondary | ICD-10-CM

## 2012-06-14 DIAGNOSIS — I129 Hypertensive chronic kidney disease with stage 1 through stage 4 chronic kidney disease, or unspecified chronic kidney disease: Secondary | ICD-10-CM | POA: Diagnosis present

## 2012-06-14 DIAGNOSIS — J449 Chronic obstructive pulmonary disease, unspecified: Secondary | ICD-10-CM | POA: Diagnosis present

## 2012-06-14 DIAGNOSIS — E861 Hypovolemia: Secondary | ICD-10-CM

## 2012-06-14 DIAGNOSIS — N183 Chronic kidney disease, stage 3 unspecified: Secondary | ICD-10-CM | POA: Diagnosis present

## 2012-06-14 DIAGNOSIS — I10 Essential (primary) hypertension: Secondary | ICD-10-CM

## 2012-06-14 DIAGNOSIS — Z86718 Personal history of other venous thrombosis and embolism: Secondary | ICD-10-CM

## 2012-06-14 DIAGNOSIS — J4489 Other specified chronic obstructive pulmonary disease: Secondary | ICD-10-CM | POA: Diagnosis present

## 2012-06-14 DIAGNOSIS — J9611 Chronic respiratory failure with hypoxia: Secondary | ICD-10-CM

## 2012-06-14 DIAGNOSIS — I4891 Unspecified atrial fibrillation: Secondary | ICD-10-CM

## 2012-06-14 DIAGNOSIS — Z7901 Long term (current) use of anticoagulants: Secondary | ICD-10-CM

## 2012-06-14 DIAGNOSIS — I2789 Other specified pulmonary heart diseases: Secondary | ICD-10-CM | POA: Diagnosis present

## 2012-06-14 DIAGNOSIS — Z6839 Body mass index (BMI) 39.0-39.9, adult: Secondary | ICD-10-CM

## 2012-06-14 HISTORY — DX: Major depressive disorder, single episode, unspecified: F32.9

## 2012-06-14 HISTORY — DX: Depression, unspecified: F32.A

## 2012-06-14 LAB — PROTIME-INR: INR: 2.88 — ABNORMAL HIGH (ref 0.00–1.49)

## 2012-06-14 LAB — BASIC METABOLIC PANEL
GFR calc Af Amer: 26 mL/min — ABNORMAL LOW (ref >90)
GFR calc non Af Amer: 22 mL/min — ABNORMAL LOW (ref >90)
Glucose, Bld: 209 mg/dL — ABNORMAL HIGH (ref 70–99)
Potassium: 5.2 mEq/L — ABNORMAL HIGH (ref 3.5–5.1)
Sodium: 128 mEq/L — ABNORMAL LOW (ref 135–145)

## 2012-06-14 LAB — URINE MICROSCOPIC-ADD ON

## 2012-06-14 LAB — CARDIAC PANEL(CRET KIN+CKTOT+MB+TROPI)
CK, MB: 1.3 ng/mL (ref 0.3–4.0)
Troponin I: <0.3 ng/mL (ref <0.30)

## 2012-06-14 LAB — URINALYSIS, ROUTINE W REFLEX MICROSCOPIC
Ketones, ur: NEGATIVE mg/dL
Nitrite: NEGATIVE
Specific Gravity, Urine: 1.01 (ref 1.005–1.030)
pH: 7 (ref 5.0–8.0)

## 2012-06-14 LAB — CBC WITH DIFFERENTIAL/PLATELET
Hemoglobin: 10.4 g/dL — ABNORMAL LOW (ref 12.0–15.0)
Lymphocytes Relative: 13 % (ref 12–46)
Lymphs Abs: 1 10*3/uL (ref 0.7–4.0)
Monocytes Relative: 6 % (ref 3–12)
Neutro Abs: 6.1 10*3/uL (ref 1.7–7.7)
Neutrophils Relative %: 78 % — ABNORMAL HIGH (ref 43–77)
RBC: 4.02 MIL/uL (ref 3.87–5.11)

## 2012-06-14 LAB — GLUCOSE, CAPILLARY: Glucose-Capillary: 167 mg/dL — ABNORMAL HIGH (ref 70–99)

## 2012-06-14 LAB — SODIUM, URINE, RANDOM: Sodium, Ur: 44 mEq/L

## 2012-06-14 MED ORDER — TRAZODONE HCL 100 MG PO TABS
100.0000 mg | ORAL_TABLET | Freq: Every evening | ORAL | Status: DC | PRN
  Administered 2012-06-14: 100 mg via ORAL
  Filled 2012-06-14 (×2): qty 1

## 2012-06-14 MED ORDER — ONDANSETRON HCL 4 MG/2ML IJ SOLN: 4.0000 mg | Freq: Four times a day (QID) | INTRAMUSCULAR | Status: DC | PRN

## 2012-06-14 MED ORDER — INSULIN ASPART 100 UNIT/ML ~~LOC~~ SOLN: 0.0000 [IU] | SUBCUTANEOUS | Status: DC

## 2012-06-14 MED ORDER — ASPIRIN EC 81 MG PO TBEC
81.0000 mg | DELAYED_RELEASE_TABLET | Freq: Every day | ORAL | Status: DC
  Administered 2012-06-14 – 2012-06-16 (×3): 81 mg via ORAL
  Filled 2012-06-14 (×3): qty 1

## 2012-06-14 MED ORDER — ALUM & MAG HYDROXIDE-SIMETH 200-200-20 MG/5ML PO SUSP: 30.0000 mL | Freq: Four times a day (QID) | ORAL | Status: DC | PRN

## 2012-06-14 MED ORDER — AMLODIPINE BESYLATE 10 MG PO TABS
10.0000 mg | ORAL_TABLET | Freq: Every day | ORAL | Status: DC
  Administered 2012-06-15 – 2012-06-16 (×2): 10 mg via ORAL
  Filled 2012-06-14 (×2): qty 1

## 2012-06-14 MED ORDER — HYDROCODONE-ACETAMINOPHEN 5-325 MG PO TABS
1.0000 | ORAL_TABLET | ORAL | Status: DC | PRN
  Administered 2012-06-14 – 2012-06-16 (×7): 2 via ORAL
  Filled 2012-06-14 (×7): qty 2

## 2012-06-14 MED ORDER — CLOTRIMAZOLE 1 % EX CREA
TOPICAL_CREAM | Freq: Two times a day (BID) | CUTANEOUS | Status: DC
  Administered 2012-06-14 – 2012-06-16 (×4): via TOPICAL
  Filled 2012-06-14: qty 15

## 2012-06-14 MED ORDER — GABAPENTIN 300 MG PO CAPS
300.0000 mg | ORAL_CAPSULE | Freq: Two times a day (BID) | ORAL | Status: DC
  Administered 2012-06-14 – 2012-06-16 (×4): 300 mg via ORAL
  Filled 2012-06-14 (×5): qty 1

## 2012-06-14 MED ORDER — ONDANSETRON HCL 4 MG PO TABS: 4.0000 mg | ORAL_TABLET | Freq: Four times a day (QID) | ORAL | Status: DC | PRN

## 2012-06-14 MED ORDER — BIOTENE DRY MOUTH MT LIQD
15.0000 mL | Freq: Two times a day (BID) | OROMUCOSAL | Status: DC
  Administered 2012-06-14 – 2012-06-16 (×4): 15 mL via OROMUCOSAL

## 2012-06-14 MED ORDER — MORPHINE SULFATE 2 MG/ML IJ SOLN
2.0000 mg | INTRAMUSCULAR | Status: DC | PRN
  Filled 2012-06-14: qty 1

## 2012-06-14 MED ORDER — ACETAMINOPHEN 650 MG RE SUPP: 650.0000 mg | Freq: Four times a day (QID) | RECTAL | Status: DC | PRN

## 2012-06-14 MED ORDER — POLYSACCHARIDE IRON COMPLEX 150 MG PO CAPS
150.0000 mg | ORAL_CAPSULE | Freq: Every day | ORAL | Status: DC
  Administered 2012-06-15 – 2012-06-16 (×2): 150 mg via ORAL
  Filled 2012-06-14 (×2): qty 1

## 2012-06-14 MED ORDER — VITAMIN B-12 1000 MCG PO TABS
1000.0000 ug | ORAL_TABLET | Freq: Two times a day (BID) | ORAL | Status: DC
  Administered 2012-06-14 – 2012-06-16 (×4): 1000 ug via ORAL
  Filled 2012-06-14 (×5): qty 1

## 2012-06-14 MED ORDER — INSULIN ASPART 100 UNIT/ML ~~LOC~~ SOLN
0.0000 [IU] | Freq: Three times a day (TID) | SUBCUTANEOUS | Status: DC
  Administered 2012-06-15: 1 [IU] via SUBCUTANEOUS
  Administered 2012-06-15: 2 [IU] via SUBCUTANEOUS
  Administered 2012-06-15: 1 [IU] via SUBCUTANEOUS
  Administered 2012-06-16 (×2): 2 [IU] via SUBCUTANEOUS

## 2012-06-14 MED ORDER — ISOSORBIDE DINITRATE 20 MG PO TABS
20.0000 mg | ORAL_TABLET | Freq: Three times a day (TID) | ORAL | Status: DC
  Administered 2012-06-14 – 2012-06-16 (×6): 20 mg via ORAL
  Filled 2012-06-14 (×7): qty 1

## 2012-06-14 MED ORDER — WARFARIN - PHARMACIST DOSING INPATIENT
Freq: Every day | Status: DC
  Administered 2012-06-16: 18:00:00

## 2012-06-14 MED ORDER — SODIUM CHLORIDE 0.9 % IV SOLN
INTRAVENOUS | Status: DC
  Administered 2012-06-14 – 2012-06-15 (×2): via INTRAVENOUS

## 2012-06-14 MED ORDER — SENNOSIDES-DOCUSATE SODIUM 8.6-50 MG PO TABS
1.0000 | ORAL_TABLET | Freq: Every evening | ORAL | Status: DC | PRN
  Administered 2012-06-14: 1 via ORAL
  Filled 2012-06-14: qty 1

## 2012-06-14 MED ORDER — VITAMIN D (ERGOCALCIFEROL) 1.25 MG (50000 UNIT) PO CAPS
50000.0000 [IU] | ORAL_CAPSULE | ORAL | Status: DC
  Administered 2012-06-15: 50000 [IU] via ORAL
  Filled 2012-06-14: qty 1

## 2012-06-14 MED ORDER — PANTOPRAZOLE SODIUM 40 MG PO TBEC
40.0000 mg | DELAYED_RELEASE_TABLET | Freq: Every day | ORAL | Status: DC
  Administered 2012-06-15 – 2012-06-16 (×2): 40 mg via ORAL
  Filled 2012-06-14 (×2): qty 1

## 2012-06-14 MED ORDER — POTASSIUM CHLORIDE ER 10 MEQ PO TBCR
10.0000 meq | EXTENDED_RELEASE_TABLET | Freq: Two times a day (BID) | ORAL | Status: DC
  Administered 2012-06-15 – 2012-06-16 (×3): 10 meq via ORAL
  Filled 2012-06-14 (×4): qty 1

## 2012-06-14 MED ORDER — CHOLESTYRAMINE LIGHT 4 G PO PACK
4.0000 g | PACK | Freq: Three times a day (TID) | ORAL | Status: DC
  Administered 2012-06-14 – 2012-06-16 (×6): 4 g via ORAL
  Filled 2012-06-14 (×7): qty 1

## 2012-06-14 MED ORDER — SODIUM CHLORIDE 0.9 % IV SOLN: INTRAVENOUS | Status: DC

## 2012-06-14 MED ORDER — ACETAMINOPHEN 325 MG PO TABS: 650.0000 mg | ORAL_TABLET | Freq: Four times a day (QID) | ORAL | Status: DC | PRN

## 2012-06-14 MED ORDER — WARFARIN SODIUM 4 MG PO TABS
4.0000 mg | ORAL_TABLET | Freq: Once | ORAL | Status: AC
  Administered 2012-06-14: 4 mg via ORAL
  Filled 2012-06-14: qty 1

## 2012-06-14 MED ORDER — HYDRALAZINE HCL 50 MG PO TABS
50.0000 mg | ORAL_TABLET | Freq: Three times a day (TID) | ORAL | Status: DC
  Administered 2012-06-14 – 2012-06-16 (×6): 50 mg via ORAL
  Filled 2012-06-14 (×7): qty 1

## 2012-06-14 MED ORDER — OLOPATADINE HCL 0.1 % OP SOLN
1.0000 [drp] | Freq: Every day | OPHTHALMIC | Status: DC
  Administered 2012-06-15 – 2012-06-16 (×2): 1 [drp] via OPHTHALMIC
  Filled 2012-06-14: qty 5

## 2012-06-14 MED ORDER — SOTALOL HCL 80 MG PO TABS
40.0000 mg | ORAL_TABLET | Freq: Two times a day (BID) | ORAL | Status: DC
  Administered 2012-06-14 – 2012-06-16 (×4): 40 mg via ORAL
  Filled 2012-06-14 (×5): qty 0.5

## 2012-06-14 NOTE — H&P (Signed)
Hospital Admission Note Date: 06/14/2012  Patient name: Gina Ashley Medical record number: [id number] Date of birth: Dec 23, 1926 Age: 78 y.o. Gender: female PCP: Ernestine Conrad, MD  Medical Service:  Attending physician:     1st Contact: Denton Ar, MD  Pager: [phone] 2nd Contact: Elyse Jarvis, MD  Pager: [phone] After 5 pm or weekends: 1st Contact:      Pager: [phone] 2nd Contact:      Pager: [phone]  Chief Complaint: fatigue and shortness of breath  History of Present Illness: Gina Ashley is an 78 yo female with a history of diabetes, Afib on coumadin, Pulmonary HTN, GERD, HTN, CKD stage 3, and OSA on CPAP, who presents with 1 week of weakness, shortness of breath, and chest pain. She states that 2 weeks ago she was admitted to [facility] hospital with "sun stroke" where she was told she had "fluid build up". Over the past week, she has had increased weakness and fatigue and unable to do her normal activities without getting short of breath. She also says she feels "funny" in her chest under her left breast, and she "doesn't feel right". Not able to describe the pain very well. She thinks it might be worse when we stands up. She had an isolated episode of nausea 4 days ago but she denies any vomiting. She says her appetite has been good recently, no recent illnesses. She has noticed that her belly size has been increasing in the past new months. Recently switched from lasix to torsemide by her PCP 2 weeks ago. Daughter states that she was started on a very low sodium diet 2-3 weeks ago.  Patient reports having 3 previous heart catheterizations that were normal, no stents, no interventions.  ROS + for cough (chronic), chronic constipation.  No dysuria, no fever or chills. No pillow orthopnea. No diarrhea.   Meds: Current Outpatient Rx  Name Route Sig Dispense Refill  . AMLODIPINE BESYLATE 10 MG PO TABS Oral Take 10 mg by mouth daily.      . CHOLESTYRAMINE LIGHT 4 G PO PACK Oral Take  4 g by mouth 3 (three) times daily as needed. For bowels    . CLOTRIMAZOLE-BETAMETHASONE 1-0.05 % EX CREA Topical Apply 1 application topically 2 (two) times daily as needed. For rash    . ERGOCALCIFEROL 50000 UNITS PO CAPS Oral Take 50,000 Units by mouth 2 (two) times a week. On Sunday and Thursday    . GABAPENTIN 300 MG PO CAPS Oral Take 300 mg by mouth 2 (two) times daily.     Marland Kitchen HYDRALAZINE HCL 50 MG PO TABS Oral Take 50 mg by mouth 3 (three) times daily.    Marland Kitchen HYDROCODONE-ACETAMINOPHEN 10-500 MG PO TABS Oral Take 1 tablet by mouth every 4 (four) hours as needed. For pain    . POLYSACCHARIDE IRON COMPLEX 150 MG PO CAPS Oral Take 150 mg by mouth daily.    Marland Kitchen IRON-VITAMIN C 65-125 MG PO TABS Oral Take 1 tablet by mouth 2 (two) times daily.    . ISOSORBIDE DINITRATE 20 MG PO TABS Oral Take 20 mg by mouth 3 (three) times daily.    Marland Kitchen LISINOPRIL 40 MG PO TABS Oral Take 10-20 mg by mouth 2 (two) times daily. Take 1 tablet (20mg ) in the morning and one-half tablet (10mg ) in the evening    . MECLIZINE HCL 50 MG PO TABS Oral Take 12.5-25 mg by mouth 3 (three) times daily as needed. For dizziness    . OLOPATADINE HCL 0.2 %  OP SOLN Ophthalmic Apply 1 drop to eye daily.    Marland Kitchen OMEPRAZOLE 40 MG PO CPDR Oral Take 40 mg by mouth daily.      Marland Kitchen POTASSIUM CHLORIDE ER 10 MEQ PO TBCR Oral Take 10 mEq by mouth 2 (two) times daily.      Marland Kitchen SITAGLIPTIN PHOSPHATE 50 MG PO TABS Oral Take 50 mg by mouth daily.    Marland Kitchen SOTALOL HCL 120 MG PO TABS Oral Take 40 mg by mouth 2 (two) times daily.     . TORSEMIDE 20 MG PO TABS Oral Take 20 mg by mouth daily.    . TRAZODONE HCL 50 MG PO TABS Oral Take 100 mg by mouth at bedtime as needed. For sleep    . VITAMIN B-12 1000 MCG PO TABS Oral Take 1,000 mcg by mouth 2 (two) times daily.    . WARFARIN SODIUM 5 MG PO TABS Oral Take 5-10 mg by mouth daily. Take 5mg  (1 tablet) on Sunday, Tuesday, and Thursday. On Monday, Wednesday, Friday, and Saturday, take 7.5mg (1 1/2 tablets).       Allergies: Allergies as of 06/14/2012 - Review Complete 06/14/2012  Allergen Reaction Noted  . Morphine sulfate Other (See Comments) 02/24/2008  . Percocet (oxycodone-acetaminophen) Other (See Comments) 05/20/2011  . Penicillins Rash 02/24/2008   Past Medical History  Diagnosis Date  . Diabetes mellitus     x 15 yrs  . GERD (gastroesophageal reflux disease)   . Back pain, chronic   . Pain in shoulder   . Pain in ankle   . Hiatal hernia   . HTN (hypertension)     all her life  . CKD (chronic kidney disease), stage III     GFR: 38  . Atrial fibrillation     since 1996  . Sleep apnea   . Fall 3 weeks ago    was evaluated at [place]  . OSA on CPAP    Past Surgical History  Procedure Date  . Bunionectomy     bilateral  . Cholecystectomy   . Rotator cuff repair     2002  . Total knee arthroplasty     bilateral 2001  . Hammer toe surgery     20 04  . Colonoscopy 07/11/2011    Procedure: COLONOSCOPY;  Surgeon: Malissa Hippo, MD;  Location: [facility];  Service: Endoscopy;  Laterality: N/A;  3:00   Family History  Problem Relation Age of Onset  . Colon cancer Brother    History   Social History  . Marital Status: Widowed    Spouse Name: N/A    Number of Children: N/A  . Years of Education: N/A   Occupational History  . Not on file.   Social History Main Topics  . Smoking status: Never Smoker   . Smokeless tobacco: Not on file  . Alcohol Use: No  . Drug Use: No  . Sexually Active: Not on file   Other Topics Concern  . Not on file   Social History Narrative  . No narrative on file    Review of Systems: Pertinent items are noted in HPI.  Physical Exam: Blood pressure 153/70, pulse 68, temperature 98.9 F (37.2 C), temperature source Oral, resp. rate 17, SpO2 95.00%. Physical Exam Blood pressure 175/77, pulse 75, temperature 97.8 F (36.6 C), temperature source Oral, resp. rate 18, height 5\' 3"  (1.6 m), weight 219 lb 12.8 oz (99.7 kg), SpO2  95.00%. General:  No acute distress, alert and oriented x  3, well-appearing  HEENT:  PERRL, EOMI, no lymphadenopathy, moist mucous membranes Cardiovascular:  Irregular rhythm, no murmurs, rubs or gallops Respiratory:  Clear to auscultation bilaterally, no wheezes, rales, or rhonchi Abdomen:  Soft, nondistended, nontender, normoactive bowel sounds Extremities:  Warm and well-perfused, no clubbing, cyanosis, trace edema.  Skin: Warm, dry, no rashes Neuro: Not anxious appearing, no depressed mood, normal affect   Lab results: Basic Metabolic Panel:  Basename 06/14/12 1530  NA 128*  K 5.2*  CL 88*  CO2 32  GLUCOSE 209*  BUN 49*  CREATININE 1.96*  CALCIUM 10.0  MG --  PHOS --   CBC:  Basename 06/14/12 1530  WBC 7.8  NEUTROABS 6.1  HGB 10.4*  HCT 32.5*  MCV 80.8  PLT 176     Basename 06/14/12 1743  LABPROT 30.6*  INR 2.88*    Imaging results:  Dg Chest 2 View  06/14/2012  *RADIOLOGY REPORT*  Clinical Data: Chest pain, shortness of breath  CHEST - 2 VIEW  Comparison: 05/21/2012  Findings: Cardiomegaly with pulmonary vascular congestion and possible mild interstitial edema, although improved from the prior study.  No definite pleural effusion.  No pneumothorax.  Degenerative changes of the visualized thoracolumbar spine.  Stable calcification overlying the left neck.  IMPRESSION: Cardiomegaly with pulmonary vascular congestion.  Possible mild interstitial edema, although improved from the prior study.  Original Report Authenticated By: Charline Bills, M.D.    Other results: EKG: atrial fibrillation, there are no previous tracings available for comparison, low voltage.    Assessment & Plan by Problem:  78 year old woman with past medical history significant for atrial fibrillation, hypertension, CKD stage III comes to the ER with the complaints of generalized weakness, lethargy and shortness of breath for about a week. She reports that she was recently admitted to  [facility] for heat stroke and has not been feeling well since her discharge.   #. Generalized weakness and lethargy: Patient reports feeling weak , tired with no energy for last 1 week ." Not feeling myself" . She also reports feeling funny in her chest- clearly denies any chest pain or pressure. This is likely Related to electrolyte disturbance ( hyponatremia) versus deconditioning. Other possibility includes hypothyroidism.  - Admit to telemetry  - Check TSH, cycle CE, lipase - Hydration - Morphine and Zofran PRN - PT/OT consult in AM  #. Hyponatremia: Etiology unclear but this could be related to mild dehydration in the setting of elevated BUN. Patient appears mild hypovolemic to euvolemic on exam. She has sodium of 128 on presentation.  - Check orthostatics - Check plasma osmolarity , urine osmolarity  and urine sodium. - Recheck BMET in AM.  - Hydrate with IV fluids, hold lasix  #. Atrial fibrillation: rate controlled. Her HR is in 80-90's.  Her last 2D echo around August 2012 showed normal LV systolic function with evidence of moderate to severe pulmonary hypertension without significant valvular abnormalities. She did undergo a pharmacologic nuclear stress test which showed no evidence of ischemia - Continue sotalol and Coumadin.  # CKD stage III: Baseline Cr is not known. She presented with Cr of 1.96. - Continue to monitor.  # HTN: BP stable. Continue home meds- hydralazine and amlodipine. Hold Lisinopril for now.  # Type 2 DM: Check AIC  Continue SSI  # OSA: CPAP at night  # DVT: Coumadin per pahrmacy   Signed: Denton Ar 06/14/2012, 7:08 PM

## 2012-06-14 NOTE — ED Notes (Signed)
Pt is here with sob and funny feeling in chest.  Pt reports weakness.  Pt appears pale.

## 2012-06-14 NOTE — ED Provider Notes (Addendum)
History     CSN: [id number]  Arrival date & time 06/14/12  1516   First MD Initiated Contact with Patient 06/14/12 1639      Chief Complaint  Patient presents with  . Chest Pain  . Shortness of Breath    (Consider location/radiation/quality/duration/timing/severity/associated sxs/prior treatment) Patient is a 78 y.o. female presenting with shortness of breath. The history is provided by the patient and a relative.  Shortness of Breath  Associated symptoms include chest pain, cough and shortness of breath. Pertinent negatives include no fever.   she has a history of diabetes, hypertension, congestive heart failure, and atrial fibrillation.  She presents to emergency department complaining of generalized weakness and no energy.  For the past 3 days.  She also has had intermittent, shortness of breath, and a "funny feeling" in her chest.  She does not have any sensation in her chest.  At this time.  She denies nausea, vomiting, fevers, diaphoresis, leg pain or swelling.  She does not have orthopnea.  She denies dysuria, or hematuria.  Recently, she was changed from Lasix to torsemide.  While lying in bed.  Right now.  She is asymptomatic  Past Medical History  Diagnosis Date  . Diabetes mellitus     x 15 yrs  . GERD (gastroesophageal reflux disease)   . Back pain, chronic   . Pain in shoulder   . Pain in ankle   . Hiatal hernia   . HTN (hypertension)     all her life  . CKD (chronic kidney disease), stage III     GFR: 38  . Atrial fibrillation     since 1996  . Sleep apnea   . Fall 3 weeks ago    was evaluated at [facility]  . OSA on CPAP     Past Surgical History  Procedure Date  . Bunionectomy     bilateral  . Cholecystectomy   . Rotator cuff repair     2002  . Total knee arthroplasty     bilateral 2001  . Hammer toe surgery     2004  . Colonoscopy 07/11/2011    Procedure: COLONOSCOPY;  Surgeon: Malissa Hippo, MD;  Location: [facility];  Service: Endoscopy;   Laterality: N/A;  3:00    Family History  Problem Relation Age of Onset  . Colon cancer Brother     History  Substance Use Topics  . Smoking status: Never Smoker   . Smokeless tobacco: Not on file  . Alcohol Use: No    OB History    Grav Para Term Preterm Abortions TAB SAB Ect Mult Living                  Review of Systems  Constitutional: Negative for fever, chills and diaphoresis.  HENT: Negative for congestion.   Respiratory: Positive for cough and shortness of breath. Negative for chest tightness.        Morning cough, without sputum  Cardiovascular: Positive for chest pain. Negative for palpitations and leg swelling.       Chest pain, described as a funny feeling  Gastrointestinal: Negative for nausea, vomiting, abdominal pain and diarrhea.  Genitourinary: Negative for dysuria, frequency and hematuria.  Musculoskeletal: Negative for back pain.  Neurological: Positive for weakness. Negative for headaches.  Psychiatric/Behavioral: Negative for confusion.  All other systems reviewed and are negative.    Allergies  Morphine; Morphine sulfate; Penicillins; and Percocet  Home Medications   Current Outpatient Rx  Name Route Sig Dispense Refill  . AMLODIPINE BESYLATE 10 MG PO TABS Oral Take 10 mg by mouth daily.      . CHOLESTYRAMINE 4 GM/DOSE PO POWD Oral Take by mouth 3 (three) times daily with meals.      Marland Kitchen CLONIDINE HCL 0.2 MG PO TABS Oral Take 0.2 mg by mouth 2 (two) times daily.      Marland Kitchen CLOTRIMAZOLE-BETAMETHASONE 1-0.05 % EX CREA Topical Apply topically 2 (two) times daily.      Marland Kitchen VITAMIN B 12 PO Oral Take by mouth.      . OMEGA-3 FATTY ACIDS 1000 MG PO CAPS Oral Take 2,400 mg by mouth daily.      . FUROSEMIDE 80 MG PO TABS Oral Take 40 mg by mouth daily.     Marland Kitchen GABAPENTIN 300 MG PO CAPS Oral Take 300 mg by mouth 2 (two) times daily.     Marland Kitchen GLIPIZIDE ER 10 MG PO TB24 Oral Take 10 mg by mouth daily.      Marland Kitchen HYDROCODONE-ACETAMINOPHEN 10-500 MG PO TABS Oral Take 1  tablet by mouth every 6 (six) hours as needed.      Marland Kitchen LISINOPRIL 40 MG PO TABS Oral Take 40 mg by mouth daily.      . MECLIZINE HCL 50 MG PO TABS Oral Take 25 mg by mouth as needed.      Marland Kitchen OMEPRAZOLE 40 MG PO CPDR Oral Take 40 mg by mouth daily.      Marland Kitchen PEG-KCL-NACL-NASULF-NA ASC-C 100 G PO SOLR Oral Take 1 kit (100 g total) by mouth once. 1 kit 0  . POTASSIUM CHLORIDE ER 10 MEQ PO TBCR Oral Take 10 mEq by mouth 2 (two) times daily.      Marland Kitchen SOTALOL HCL 120 MG PO TABS Oral Take 40 mg by mouth 2 (two) times daily.     . TRAZODONE HCL 50 MG PO TABS Oral Take 50 mg by mouth at bedtime as needed.       There were no vitals taken for this visit.  Physical Exam  Nursing note and vitals reviewed. Constitutional: She is oriented to person, place, and time. No distress.       Morbidly obese  HENT:  Head: Normocephalic and atraumatic.  Eyes: Conjunctivae are normal.  Neck: Normal range of motion. Neck supple.  Cardiovascular: Normal rate and intact distal pulses.   Murmur heard.      Irregular heartbeat  Pulmonary/Chest: Effort normal and breath sounds normal. No respiratory distress. She has no wheezes. She has no rales.  Abdominal: Soft. She exhibits no distension. There is no tenderness. There is no guarding.  Musculoskeletal: Normal range of motion. She exhibits edema. She exhibits no tenderness.       1+ nonpitting edema in bilateral lower extremities  Neurological: She is alert and oriented to person, place, and time.  Skin: Skin is warm and dry.  Psychiatric: She has a normal mood and affect. Thought content normal.    ED Course  Procedures (including critical care time) weakness, and intermittent, shortness of breath.  An 78 year old, female, with known congestive heart failure, and atrial fibrillation.  Labs Reviewed  BASIC METABOLIC PANEL - Abnormal; Notable for the following:    Sodium 128 (*)     Potassium 5.2 (*)     Chloride 88 (*)     Glucose, Bld 209 (*)     BUN 49 (*)       Creatinine, Ser 1.96 (*)  GFR calc non Af Amer 22 (*)     GFR calc Af Amer 26 (*)     All other components within normal limits  CBC WITH DIFFERENTIAL - Abnormal; Notable for the following:    Hemoglobin 10.4 (*)     HCT 32.5 (*)     MCH 25.9 (*)     Neutrophils Relative 78 (*)     All other components within normal limits   Dg Chest 2 View  06/14/2012  *RADIOLOGY REPORT*  Clinical Data: Chest pain, shortness of breath  CHEST - 2 VIEW  Comparison: 05/21/2012  Findings: Cardiomegaly with pulmonary vascular congestion and possible mild interstitial edema, although improved from the prior study.  No definite pleural effusion.  No pneumothorax.  Degenerative changes of the visualized thoracolumbar spine.  Stable calcification overlying the left neck.  IMPRESSION: Cardiomegaly with pulmonary vascular congestion.  Possible mild interstitial edema, although improved from the prior study.  Original Report Authenticated By: Charline Bills, M.D.     No diagnosis found.  ECG. Atrial fibrillation. Left axis deviation. Normal intervals. Low voltage. Nonspecific T waves   6:07 PM Spoke with OPC md. She will admit  MDM  Weakness, and shortness of breath Hyponatremia Hyperglycemia Dehydration Renal insufficiency        Cheri Guppy, MD 06/14/12 1749  Cheri Guppy, MD 06/14/12 1807

## 2012-06-14 NOTE — Progress Notes (Signed)
ANTICOAGULATION CONSULT NOTE - Initial Consult  Pharmacy Consult for coumadin Indication: atrial fibrillation  Allergies  Allergen Reactions  . Morphine Sulfate Other (See Comments)    REACTION: change in personality  . Percocet (Oxycodone-Acetaminophen) Other (See Comments)    Does not relieve the pain  . Penicillins Rash    Patient Measurements: Height: 5\' 3"  (160 cm) Weight: 219 lb 12.8 oz (99.7 kg) (scale C) IBW/kg (Calculated) : 52.4   Vital Signs: Temp: 98.9 F (37.2 C) (08/11 1811) Temp src: Oral (08/11 1811) BP: 153/70 mmHg (08/11 1811) Pulse Rate: 68  (08/11 1811)  Labs:  Basename 06/14/12 1743 06/14/12 1530  HGB -- 10.4*  HCT -- 32.5*  PLT -- 176  APTT -- --  LABPROT 30.6* --  INR 2.88* --  HEPARINUNFRC -- --  CREATININE -- 1.96*  CKTOTAL -- --  CKMB -- --  TROPONINI -- --    Estimated Creatinine Clearance: 24.5 ml/min (by C-G formula based on Cr of 1.96).   Medical History: Past Medical History  Diagnosis Date  . Diabetes mellitus     x 15 yrs  . GERD (gastroesophageal reflux disease)   . Back pain, chronic   . Pain in shoulder   . Pain in ankle   . Hiatal hernia   . HTN (hypertension)     all her life  . CKD (chronic kidney disease), stage III     GFR: 38  . Atrial fibrillation     since 1996  . Sleep apnea   . Fall 3 weeks ago    was evaluated at [facility]  . OSA on CPAP     Medications:  Prescriptions prior to admission  Medication Sig Dispense Refill  . amLODipine (NORVASC) 10 MG tablet Take 10 mg by mouth daily.        . cholestyramine light (PREVALITE) 4 G packet Take 4 g by mouth 3 (three) times daily as needed. For bowels      . clotrimazole-betamethasone (LOTRISONE) cream Apply 1 application topically 2 (two) times daily as needed. For rash      . ergocalciferol (VITAMIN D2) 50000 UNITS capsule Take 50,000 Units by mouth 2 (two) times a week. On Sunday and Thursday      . gabapentin (NEURONTIN) 300 MG capsule Take 300 mg  by mouth 2 (two) times daily.       . hydrALAZINE (APRESOLINE) 50 MG tablet Take 50 mg by mouth 3 (three) times daily.      Marland Kitchen HYDROcodone-acetaminophen (LORTAB) 10-500 MG per tablet Take 1 tablet by mouth every 4 (four) hours as needed. For pain      . iron polysaccharides (NIFEREX) 150 MG capsule Take 150 mg by mouth daily.      . Iron-Vitamin C (VITRON-C) 65-125 MG TABS Take 1 tablet by mouth 2 (two) times daily.      . isosorbide dinitrate (ISORDIL) 20 MG tablet Take 20 mg by mouth 3 (three) times daily.      Marland Kitchen lisinopril (PRINIVIL,ZESTRIL) 40 MG tablet Take 10-20 mg by mouth 2 (two) times daily. Take 1 tablet (20mg ) in the morning and one-half tablet (10mg ) in the evening      . meclizine (ANTIVERT) 50 MG tablet Take 12.5-25 mg by mouth 3 (three) times daily as needed. For dizziness      . Olopatadine HCl (PATADAY) 0.2 % SOLN Apply 1 drop to eye daily.      Marland Kitchen omeprazole (PRILOSEC) 40 MG capsule Take 40 mg by  mouth daily.        . potassium chloride (K-DUR) 10 MEQ tablet Take 10 mEq by mouth 2 (two) times daily.        . sitaGLIPtin (JANUVIA) 50 MG tablet Take 50 mg by mouth daily.      . sotalol (BETAPACE) 120 MG tablet Take 40 mg by mouth 2 (two) times daily.       Marland Kitchen torsemide (DEMADEX) 20 MG tablet Take 20 mg by mouth daily.      . traZODone (DESYREL) 50 MG tablet Take 100 mg by mouth at bedtime as needed. For sleep      . vitamin B-12 (CYANOCOBALAMIN) 1000 MCG tablet Take 1,000 mcg by mouth 2 (two) times daily.      Marland Kitchen warfarin (COUMADIN) 5 MG tablet Take 5-10 mg by mouth daily. Take 5mg  (1 tablet) on Sunday, Tuesday, and Thursday. On Monday, Wednesday, Friday, and Saturday, take 7.5mg  (1 1/2 tablets).        Assessment: 85 YOF on chronic coumadin for afib, who presented with SOB and general weakness. Pharmacy is consulted to resume coumadin. INR (2.88) therapeutic on admission. Hgb 10.4, plts 176. Noted coumadin home dose: 5mg  on Sunday, Tuesday and Tursday, 7.5mg  on Monday, Wednesday  Friday and Saturday. Last dose on 8/10.   Goal of Therapy:  INR 2-3 Monitor platelets by anticoagulation protocol: Yes   Plan:  - Coumadin 4 mg po x 1 - F/u daily INR  Bayard Hugger, PharmD, BCPS  Clinical Pharmacist  Pager: [phone]  06/14/2012,7:42 PM

## 2012-06-15 DIAGNOSIS — I4891 Unspecified atrial fibrillation: Secondary | ICD-10-CM

## 2012-06-15 DIAGNOSIS — R5383 Other fatigue: Secondary | ICD-10-CM

## 2012-06-15 DIAGNOSIS — I10 Essential (primary) hypertension: Secondary | ICD-10-CM

## 2012-06-15 DIAGNOSIS — G4733 Obstructive sleep apnea (adult) (pediatric): Secondary | ICD-10-CM

## 2012-06-15 DIAGNOSIS — E861 Hypovolemia: Secondary | ICD-10-CM | POA: Diagnosis present

## 2012-06-15 LAB — BLOOD GAS, ARTERIAL
Acid-Base Excess: 6.1 mmol/L — ABNORMAL HIGH (ref 0.0–2.0)
Drawn by: 275531
O2 Content: 2 L/min
O2 Saturation: 90.5 %
Patient temperature: 98.6
pO2, Arterial: 64.6 mmHg — ABNORMAL LOW (ref 80.0–100.0)

## 2012-06-15 LAB — TSH: TSH: 0.326 u[IU]/mL — ABNORMAL LOW (ref 0.350–4.500)

## 2012-06-15 LAB — GLUCOSE, CAPILLARY
Glucose-Capillary: 174 mg/dL — ABNORMAL HIGH (ref 70–99)
Glucose-Capillary: 156 mg/dL — ABNORMAL HIGH (ref 70–99)

## 2012-06-15 LAB — COMPREHENSIVE METABOLIC PANEL
AST: 15 U/L (ref 0–37)
Albumin: 3.6 g/dL (ref 3.5–5.2)
Alkaline Phosphatase: 67 U/L (ref 39–117)
CO2: 33 mEq/L — ABNORMAL HIGH (ref 19–32)
Calcium: 9.5 mg/dL (ref 8.4–10.5)
Glucose, Bld: 127 mg/dL — ABNORMAL HIGH (ref 70–99)
Sodium: 138 mEq/L (ref 135–145)
Total Protein: 6.8 g/dL (ref 6.0–8.3)

## 2012-06-15 LAB — CBC
HCT: 27.8 % — ABNORMAL LOW (ref 36.0–46.0)
MCH: 25.8 pg — ABNORMAL LOW (ref 26.0–34.0)
MCV: 80.6 fL (ref 78.0–100.0)
RBC: 3.45 MIL/uL — ABNORMAL LOW (ref 3.87–5.11)
WBC: 5.1 10*3/uL (ref 4.0–10.5)

## 2012-06-15 LAB — OSMOLALITY: Osmolality: 298 mOsm/kg (ref 275–300)

## 2012-06-15 LAB — PROTIME-INR: INR: 3.21 — ABNORMAL HIGH (ref 0.00–1.49)

## 2012-06-15 LAB — T4, FREE: Free T4: 1.63 ng/dL (ref 0.80–1.80)

## 2012-06-15 MED ORDER — IPRATROPIUM-ALBUTEROL 18-103 MCG/ACT IN AERO
1.0000 | INHALATION_SPRAY | RESPIRATORY_TRACT | Status: DC | PRN
  Filled 2012-06-15: qty 14.7

## 2012-06-15 NOTE — Progress Notes (Signed)
ANTICOAGULATION CONSULT NOTE - Follow Up Consult  Pharmacy Consult for Coumadin Indication: atrial fibrillation  Allergies  Allergen Reactions  . Morphine Sulfate Other (See Comments)    REACTION: change in personality  . Percocet (Oxycodone-Acetaminophen) Other (See Comments)    Does not relieve the pain  . Penicillins Rash   Vital Signs: Temp: 97.3 F (36.3 C) (08/12 0458) Temp src: Oral (08/12 0458) BP: 132/49 mmHg (08/12 0458) Pulse Rate: 57  (08/12 0458)  Labs:  Basename 06/15/12 0625 06/14/12 1948 06/14/12 1743 06/14/12 1530  HGB 8.9* -- -- 10.4*  HCT 27.8* -- -- 32.5*  PLT 124* -- -- 176  APTT -- -- -- --  LABPROT 33.3* -- 30.6* --  INR 3.21* -- 2.88* --  HEPARINUNFRC -- -- -- --  CREATININE 1.72* -- -- 1.96*  CKTOTAL -- 48 -- --  CKMB -- 1.3 -- --  TROPONINI -- <0.30 -- --    Estimated Creatinine Clearance: 27.7 ml/min (by C-G formula based on Cr of 1.72).  Assessment: 85yof continues on coumadin with a supratherapeutic INR despite receiving 1mg  less than her daily home dose last evening. Hgb/Hct/Plts have also dropped. No bleeding noted.  Goal of Therapy:  INR 2-3 Monitor platelets by anticoagulation protocol: Yes   Plan:  1) No coumadin tonight 2) Follow up INR in AM  Fredrik Rigger 06/15/2012,8:32 AM

## 2012-06-15 NOTE — H&P (Signed)
INTERNAL MEDICINE TEACHING SERVICE Attending Admission Note  Date: 06/15/2012  Patient name: Gina Ashley  Medical record number: [id number]  Date of birth: 12/21/1926    I have seen and evaluated Gina Ashley and discussed their care with the Residency Team.  85 yr. Old female with pmhx significant for pulmonary HTN, atrial fib on coumadin therapy, CKD 3, HTN, GERD, OSA with night CPAP, NIDDM type 2, presented with fatigue. She states over the past week she has also had some increased SOB and "wheezing". She was recently admitted to [facility] with what sounds like heat stroke and possibly fluid overload. Her daughter recently switched her to a low sodium diet and she has had her diuretic changed to torsemide from furosemide. She admits to a "funny" sensation under her left breast which she cannot describe very well. She states these sensations have been occuring over past few days. She denies any CP overnight. She reports having a hx of cardiac catherizations without interventions or significant findings. She has ruled out for ACS. She was found to have evidence of hypovolemia on admission that has corrected with IVF. She feels better today. She states she uses home O2 at times "when I need it".   Physical Exam: Blood pressure 130/55, pulse 57, temperature 97.3 F (36.3 C), temperature source Oral, resp. rate 20, height 5\' 3"  (1.6 m), weight 217 lb 9.6 oz (98.703 kg), SpO2 97.00%.  General: Vital signs reviewed and noted. Well-developed, well-nourished, in no acute distress; alert, appropriate and cooperative throughout examination.  Head: Normocephalic, atraumatic.  Eyes: PERRL, EOMI, No signs of anemia or jaundince.  Nose: Mucous membranes moist, not inflammed, nonerythematous.  Throat: Oropharynx nonerythematous, no exudate appreciated.   Neck: No deformities, masses, or tenderness noted.Supple, No carotid Bruits, no JVD.  Lungs:  Normal respiratory effort. Clear to auscultation  BL without crackles or wheezes.  Heart: RRR. S1 and S2 normal without gallop, murmur, or rubs.  Abdomen:  BS normoactive. Soft, Nondistended, non-tender.  No masses or organomegaly.  Extremities: trace edema.  Neurologic: A&O X3, CN II - XII are grossly intact. Motor strength is 5/5 in the all 4 extremities, Sensations intact to light touch, Cerebellar signs negative.  Skin: No visible rashes, scars.    Lab results: Results for orders placed during the hospital encounter of 06/14/12 (from the past 24 hour(s))  BASIC METABOLIC PANEL     Status: Abnormal   Collection Time   06/14/12  3:30 PM      Component Value Range   Sodium 128 (*) 135 - 145 mEq/L   Potassium 5.2 (*) 3.5 - 5.1 mEq/L   Chloride 88 (*) 96 - 112 mEq/L   CO2 32  19 - 32 mEq/L   Glucose, Bld 209 (*) 70 - 99 mg/dL   BUN 49 (*) 6 - 23 mg/dL   Creatinine, Ser 4.09 (*) 0.50 - 1.10 mg/dL   Calcium 81.1  8.4 - 91.4 mg/dL   GFR calc non Af Amer 22 (*) >90 mL/min   GFR calc Af Amer 26 (*) >90 mL/min  CBC WITH DIFFERENTIAL     Status: Abnormal   Collection Time   06/14/12  3:30 PM      Component Value Range   WBC 7.8  4.0 - 10.5 K/uL   RBC 4.02  3.87 - 5.11 MIL/uL   Hemoglobin 10.4 (*) 12.0 - 15.0 g/dL   HCT 78.2 (*) 95.6 - 21.3 %   MCV 80.8  78.0 - 100.0 fL  MCH 25.9 (*) 26.0 - 34.0 pg   MCHC 32.0  30.0 - 36.0 g/dL   RDW 16.1  09.6 - 04.5 %   Platelets 176  150 - 400 K/uL   Neutrophils Relative 78 (*) 43 - 77 %   Neutro Abs 6.1  1.7 - 7.7 K/uL   Lymphocytes Relative 13  12 - 46 %   Lymphs Abs 1.0  0.7 - 4.0 K/uL   Monocytes Relative 6  3 - 12 %   Monocytes Absolute 0.5  0.1 - 1.0 K/uL   Eosinophils Relative 2  0 - 5 %   Eosinophils Absolute 0.2  0.0 - 0.7 K/uL   Basophils Relative 0  0 - 1 %   Basophils Absolute 0.0  0.0 - 0.1 K/uL  PROTIME-INR     Status: Abnormal   Collection Time   06/14/12  5:43 PM      Component Value Range   Prothrombin Time 30.6 (*) 11.6 - 15.2 seconds   INR 2.88 (*) 0.00 - 1.49    URINALYSIS, ROUTINE W REFLEX MICROSCOPIC     Status: Abnormal   Collection Time   06/14/12  6:56 PM      Component Value Range   Color, Urine YELLOW  YELLOW   APPearance CLOUDY (*) CLEAR   Specific Gravity, Urine 1.010  1.005 - 1.030   pH 7.0  5.0 - 8.0   Glucose, UA NEGATIVE  NEGATIVE mg/dL   Hgb urine dipstick NEGATIVE  NEGATIVE   Bilirubin Urine NEGATIVE  NEGATIVE   Ketones, ur NEGATIVE  NEGATIVE mg/dL   Protein, ur NEGATIVE  NEGATIVE mg/dL   Urobilinogen, UA 0.2  0.0 - 1.0 mg/dL   Nitrite NEGATIVE  NEGATIVE   Leukocytes, UA MODERATE (*) NEGATIVE  URINE MICROSCOPIC-ADD ON     Status: Abnormal   Collection Time   06/14/12  6:56 PM      Component Value Range   Squamous Epithelial / LPF MANY (*) RARE   WBC, UA 7-10  <3 WBC/hpf   RBC / HPF 0-2  <3 RBC/hpf   Bacteria, UA RARE  RARE  CARDIAC PANEL(CRET KIN+CKTOT+MB+TROPI)     Status: Normal   Collection Time   06/14/12  7:48 PM      Component Value Range   Total CK 48  7 - 177 U/L   CK, MB 1.3  0.3 - 4.0 ng/mL   Troponin I <0.30  <0.30 ng/mL   Relative Index RELATIVE INDEX IS INVALID  0.0 - 2.5  HEMOGLOBIN A1C     Status: Abnormal   Collection Time   06/14/12  7:48 PM      Component Value Range   Hemoglobin A1C 6.7 (*) <5.7 %   Mean Plasma Glucose 146 (*) <117 mg/dL  LIPASE, BLOOD     Status: Normal   Collection Time   06/14/12  8:18 PM      Component Value Range   Lipase 39  11 - 59 U/L  TSH     Status: Abnormal   Collection Time   06/14/12  8:18 PM      Component Value Range   TSH 0.326 (*) 0.350 - 4.500 uIU/mL  OSMOLALITY     Status: Normal   Collection Time   06/14/12  8:18 PM      Component Value Range   Osmolality 298  275 - 300 mOsm/kg  GLUCOSE, CAPILLARY     Status: Abnormal   Collection Time   06/14/12  9:37  PM      Component Value Range   Glucose-Capillary 167 (*) 70 - 99 mg/dL   Comment 1 Notify RN    OSMOLALITY, URINE     Status: Abnormal   Collection Time   06/14/12 10:59 PM      Component Value Range    Osmolality, Ur 218 (*) 390 - 1090 mOsm/kg  SODIUM, URINE, RANDOM     Status: Normal   Collection Time   06/14/12 10:59 PM      Component Value Range   Sodium, Ur 44    COMPREHENSIVE METABOLIC PANEL     Status: Abnormal   Collection Time   06/15/12  6:25 AM      Component Value Range   Sodium 138  135 - 145 mEq/L   Potassium 4.3  3.5 - 5.1 mEq/L   Chloride 99  96 - 112 mEq/L   CO2 33 (*) 19 - 32 mEq/L   Glucose, Bld 127 (*) 70 - 99 mg/dL   BUN 46 (*) 6 - 23 mg/dL   Creatinine, Ser 4.78 (*) 0.50 - 1.10 mg/dL   Calcium 9.5  8.4 - 29.5 mg/dL   Total Protein 6.8  6.0 - 8.3 g/dL   Albumin 3.6  3.5 - 5.2 g/dL   AST 15  0 - 37 U/L   ALT 15  0 - 35 U/L   Alkaline Phosphatase 67  39 - 117 U/L   Total Bilirubin 0.4  0.3 - 1.2 mg/dL   GFR calc non Af Amer 26 (*) >90 mL/min   GFR calc Af Amer 30 (*) >90 mL/min  CBC     Status: Abnormal   Collection Time   06/15/12  6:25 AM      Component Value Range   WBC 5.1  4.0 - 10.5 K/uL   RBC 3.45 (*) 3.87 - 5.11 MIL/uL   Hemoglobin 8.9 (*) 12.0 - 15.0 g/dL   HCT 62.1 (*) 30.8 - 65.7 %   MCV 80.6  78.0 - 100.0 fL   MCH 25.8 (*) 26.0 - 34.0 pg   MCHC 32.0  30.0 - 36.0 g/dL   RDW 84.6  96.2 - 95.2 %   Platelets 124 (*) 150 - 400 K/uL  PROTIME-INR     Status: Abnormal   Collection Time   06/15/12  6:25 AM      Component Value Range   Prothrombin Time 33.3 (*) 11.6 - 15.2 seconds   INR 3.21 (*) 0.00 - 1.49  GLUCOSE, CAPILLARY     Status: Abnormal   Collection Time   06/15/12  6:35 AM      Component Value Range   Glucose-Capillary 124 (*) 70 - 99 mg/dL   Comment 1 Notify RN    GLUCOSE, CAPILLARY     Status: Abnormal   Collection Time   06/15/12 11:12 AM      Component Value Range   Glucose-Capillary 174 (*) 70 - 99 mg/dL   Comment 1 Notify RN    CARDIAC PANEL(CRET KIN+CKTOT+MB+TROPI)     Status: Normal   Collection Time   06/15/12 11:56 AM      Component Value Range   Total CK 42  7 - 177 U/L   CK, MB 1.2  0.3 - 4.0 ng/mL   Troponin I  <0.30  <0.30 ng/mL   Relative Index RELATIVE INDEX IS INVALID  0.0 - 2.5    Imaging results:  Dg Chest 2 View  06/14/2012  *RADIOLOGY REPORT*  Clinical  Data: Chest pain, shortness of breath  CHEST - 2 VIEW  Comparison: 05/21/2012  Findings: Cardiomegaly with pulmonary vascular congestion and possible mild interstitial edema, although improved from the prior study.  No definite pleural effusion.  No pneumothorax.  Degenerative changes of the visualized thoracolumbar spine.  Stable calcification overlying the left neck.  IMPRESSION: Cardiomegaly with pulmonary vascular congestion.  Possible mild interstitial edema, although improved from the prior study.  Original Report Authenticated By: Charline Bills, M.D.     Assessment and Plan: I agree with the formulated Assessment and Plan with the following changes:  85 yr. Old female with pmhx significant for pulmonary HTN, atrial fib on coumadin therapy, CKD 3, HTN, GERD, OSA with night CPAP, NIDDM type 2, presented with fatigue and CP. 1) Fatigue: This is likely a result of hypovolemia. She has restricted her sodium intake and was recently given torsemide, which replaced furosemide. She feels better today. 2) Atypical CP: Ruled out for ACS. It would he helpful to review records from her recent hospitalization. She reports hx of cardiac caths. 3) O2 dependence: Underlying reason? She may have COPD. Records again may be useful. Ambulate to determine O2 rec. ABG may be helpful. 4) CKD 3: baseline Cr? Records would be helpful. 5) Afib: pharmacy to dose coumadin. Rate controlled. 6) Pulm HTN: apparent hx. Echo shows underestimated pulm pressures. She has some AoV mild stenosis. This may be the reason for her diuretic therapy. Records would be useful. -rest per resident physician note. Jonah Blue, [place] 8/12/20133:05 PM

## 2012-06-15 NOTE — Progress Notes (Signed)
Subjective:  Gina Ashley is complaining of fatigue again this morning. She has been walking around the room some and she felt weak. THe nurse reports that the patient desatted to high 70's while sitting so she put some O2 by [place] on her. DUring this time the patient was asymptomatic, not short of breath, not c/o headache or chest pain.   Patient denies chest pain, just weakness and fatigue. SHe denies a history of COPD but states that she uses oxygen at home when she feels short of breath. She admits she has some dyspnea on exertion and is unable to walk a full flight of stairs and when she feels like that she uses oxygen. The diagnosis that necessitates her home O2 is unclear.  Objective: Vital signs in last 24 hours: Filed Vitals:   06/15/12 0220 06/15/12 0221 06/15/12 0222 06/15/12 0458  BP: 101/42 121/55 131/65 132/49  Pulse: 59 67 80 57  Temp: 98.7 F (37.1 C)   97.3 F (36.3 C)  TempSrc: Oral   Oral  Resp: 20     Height:      Weight:    217 lb 9.6 oz (98.703 kg)  SpO2: 95%   97%   Weight change:   Intake/Output Summary (Last 24 hours) at 06/15/12 1610 Last data filed at 06/15/12 9604  Gross per 24 hour  Intake 866.67 ml  Output   1925 ml  Net -1058.33 ml   Physical Exam  General: No acute distress, alert and oriented x 3, well-appearing  HEENT: PERRL, EOMI, no lymphadenopathy, moist mucous membranes  Cardiovascular: Irregular rhythm, no murmurs, rubs or gallops  Respiratory: Clear to auscultation bilaterally, slightly decreased breath sounds, no wheezes, rales, or rhonchi  Abdomen: Soft, obese, nondistended, nontender, normoactive bowel sounds  Extremities: Warm and well-perfused, no clubbing, cyanosis, trace edema.  Skin: Warm, dry, no rashes  Neuro: Not anxious appearing, no depressed mood, normal affect   Lab Results: Basic Metabolic Panel:  Lab 06/14/12 5409  NA 128*  K 5.2*  CL 88*  CO2 32  GLUCOSE 209*  BUN 49*  CREATININE 1.96*  CALCIUM 10.0  MG --    PHOS --    Lab 06/14/12 2018  LIPASE 39  AMYLASE --   CBC:  Lab 06/15/12 0625 06/14/12 1530  WBC 5.1 7.8  NEUTROABS -- 6.1  HGB 8.9* 10.4*  HCT 27.8* 32.5*  MCV 80.6 80.8  PLT 124* 176   Cardiac Enzymes:  Lab 06/14/12 1948  CKTOTAL 48  CKMB 1.3  CKMBINDEX --  TROPONINI <0.30   CBG:  Lab 06/15/12 0635 06/14/12 2137  GLUCAP 124* 167*   Hemoglobin A1C:  Lab 06/14/12 1948  HGBA1C 6.7*   Thyroid Function Tests:  Lab 06/14/12 2018  TSH 0.326*  T4TOTAL --  FREET4 --  T3FREE --  THYROIDAB --   Coagulation:  Lab 06/15/12 0625 06/14/12 1743  LABPROT 33.3* 30.6*  INR 3.21* 2.88*   Urinalysis:  Lab 06/14/12 1856  COLORURINE YELLOW  LABSPEC 1.010  PHURINE 7.0  GLUCOSEU NEGATIVE  HGBUR NEGATIVE  BILIRUBINUR NEGATIVE  KETONESUR NEGATIVE  PROTEINUR NEGATIVE  UROBILINOGEN 0.2  NITRITE NEGATIVE  LEUKOCYTESUR MODERATE*    Studies/Results: Dg Chest 2 View  06/14/2012  *RADIOLOGY REPORT*  Clinical Data: Chest pain, shortness of breath  CHEST - 2 VIEW  Comparison: 05/21/2012  Findings: Cardiomegaly with pulmonary vascular congestion and possible mild interstitial edema, although improved from the prior study.  No definite pleural effusion.  No pneumothorax.  Degenerative changes of  the visualized thoracolumbar spine.  Stable calcification overlying the left neck.  IMPRESSION: Cardiomegaly with pulmonary vascular congestion.  Possible mild interstitial edema, although improved from the prior study.  Original Report Authenticated By: Charline Bills, M.D.   Medications:  Scheduled Meds:   . amLODipine  10 mg Oral Daily  . antiseptic oral rinse  15 mL Mouth Rinse BID  . aspirin EC  81 mg Oral Daily  . cholestyramine light  4 g Oral TID  . clotrimazole   Topical BID  . gabapentin  300 mg Oral BID  . hydrALAZINE  50 mg Oral TID  . insulin aspart  0-9 Units Subcutaneous TID WC  . iron polysaccharides  150 mg Oral Daily  . isosorbide dinitrate  20 mg Oral TID   . olopatadine  1 drop Both Eyes Daily  . pantoprazole  40 mg Oral Q1200  . potassium chloride  10 mEq Oral BID  . sotalol  40 mg Oral BID  . vitamin B-12  1,000 mcg Oral BID  . Vitamin D (Ergocalciferol)  50,000 Units Oral 2 times weekly  . warfarin  4 mg Oral ONCE-1800  . Warfarin - Pharmacist Dosing Inpatient   Does not apply q1800  . DISCONTD: insulin aspart  0-9 Units Subcutaneous Q4H   Continuous Infusions:   . sodium chloride 100 mL/hr at 06/15/12 0623  . DISCONTD: sodium chloride     PRN Meds:.acetaminophen, acetaminophen, alum & mag hydroxide-simeth, HYDROcodone-acetaminophen, ondansetron (ZOFRAN) IV, ondansetron, senna-docusate, traZODone, DISCONTD:  morphine injection    Assessment/Plan:  78 year old woman with past medical history significant for atrial fibrillation, hypertension, CKD stage III comes to the ER with the complaints of generalized weakness, lethargy and shortness of breath for about a week. She reports that she was recently admitted to [facility] for heat stroke and has not been feeling well since her discharge.   # Generalized weakness and lethargy: Unclear etiology. Patient reports feeling weak , tired with no energy for last 1 week ." Not feeling myself" . She also reports feeling funny in her chest- clearly denies any chest pain or pressure. This is likely related to dehydration and electrolyte disturbances with her home dose of diuretic (also consider deconditioning vs anemia). Cycled cardiac enzymes negative. TSH slightly low.   *suspect patient came in volume contracted, taking higher than required diuretic dose, especially given recent switch to more potent torsemide and low salt diet - PT/OT consult   #Oxygen requirement: Patient uses home O2 PRN, patient not aware of underlying diagnosis -ABG 7.324/63/64/31 -patient retaining CO2 -?underlying COPD, will consider starting combivent -will ambulate with pulse ox to evaluate for home O2  need  #. Hyponatremia: Etiology unclear but this could be related to mild dehydration in the setting of elevated BUN. Patient appears mild hypovolemic to euvolemic on exam. She has sodium of 128 on presentation.  - Na normalized overnight with NS rehydration  #. Atrial fibrillation: rate controlled. Her HR is in 80-90's. Her last 2D echo around August 2012 showed normal LV systolic function with evidence of moderate to severe pulmonary hypertension without significant valvular abnormalities. She did undergo a pharmacologic nuclear stress test which showed no evidence of ischemia  - Continue sotalol and Coumadin.   # CKD stage III: Baseline Cr is not known. She presented with Cr of 1.96. At recent hospitalization, Cr was 2.74. - Continue to monitor.   # HTN: BP stable.  Continue home meds- hydralazine and amlodipine. Hold Lisinopril for now.   #  Type 2 DM: Check AIC  Continue SSI   # OSA: CPAP at night   # DVT: Coumadin per pahrmacy    LOS: 1 day   [place] Ar 06/15/2012, 7:22 AM

## 2012-06-15 NOTE — Progress Notes (Signed)
Pt's Blood gas result as follows: PH 7.324, C02 63.1,P02 64.6, Bicarb 31.9, acid-Base excess 6.1.  Called in By [place] in RT.  Dr. Elenora Gamma made aware.  Amanda Pea, Charity fundraiser.

## 2012-06-15 NOTE — Evaluation (Signed)
Physical Therapy Evaluation Patient Details Name: Makenize Messman MRN: [id number] DOB: 09-04-1927 Today's Date: 06/15/2012 Time: 4098-1191 PT Time Calculation (min): 13 min  PT Assessment / Plan / Recommendation Clinical Impression  Pt admitted for generalized weakness and afib.  Pt reports feeling much better since admisson.  Pt would benefit from acute PT services in order to improve independence with transfers, ambulation, and stairs to prepare for safe d/c to 2nd floor apt with daughter.    PT Assessment  Patient needs continued PT services    Follow Up Recommendations  No PT follow up    Barriers to Discharge        Equipment Recommendations  None recommended by PT    Recommendations for Other Services     Frequency Min 3X/week    Precautions / Restrictions Precautions Precautions: None   Pertinent Vitals/Pain No pain, ambulated on 3L oxygen, 2.5 L [place] in room, denies SOB with activity      Mobility  Bed Mobility Bed Mobility: Supine to Sit;Sit to Supine Supine to Sit: 6: Modified independent (Device/Increase time);HOB elevated;With rails Sit to Supine: 6: Modified independent (Device/Increase time);HOB elevated;With rail Transfers Transfers: Stand to Sit;Sit to Stand Sit to Stand: 4: Min guard;From bed Stand to Sit: 4: Min guard;To bed Details for Transfer Assistance: slow to rise Ambulation/Gait Ambulation/Gait Assistance: 4: Min guard Ambulation Distance (Feet): 160 Feet Assistive device: Straight cane Ambulation/Gait Assistance Details: pt denies SOB, also reports she normally doesn't use oxygen when ambulating, ambulated on 3L (2.5L [place] in room) Gait Pattern: Step-through pattern;Decreased stride length Gait velocity: decreased General Gait Details: steady gait, no LOB or unsteadiness observed    Exercises     PT Diagnosis: Difficulty walking  PT Problem List: Decreased activity tolerance;Decreased mobility PT Treatment Interventions: DME instruction;Gait  training;Stair training;Functional mobility training;Therapeutic activities;Therapeutic exercise;Patient/family education   PT Goals Acute Rehab PT Goals PT Goal Formulation: With patient Time For Goal Achievement: 06/22/12 Potential to Achieve Goals: Good Pt will go Sit to Stand: with modified independence PT Goal: Sit to Stand - Progress: Goal set today Pt will go Stand to Sit: with modified independence PT Goal: Stand to Sit - Progress: Goal set today Pt will Ambulate: >150 feet;with modified independence;with least restrictive assistive device;Other (comment) (no oxygen, SaO2 >92%) PT Goal: Ambulate - Progress: Goal set today Pt will Go Up / Down Stairs: Flight;with supervision;with least restrictive assistive device;with rail(s) PT Goal: Up/Down Stairs - Progress: Goal set today  Visit Information  Last PT Received On: 06/15/12 Assistance Needed: +1    Subjective Data  Subjective: I don't always use the cane.   Prior Functioning  Home Living Lives With: Daughter Type of Home: Apartment Home Access: Stairs to enter Entrance Stairs-Number of Steps: 10 Entrance Stairs-Rails: Right Home Layout: One level Home Adaptive Equipment: Straight cane Prior Function Level of Independence: Independent with assistive device(s) Comments: Pt reports occasional use of SPC.    Cognition  Overall Cognitive Status: Appears within functional limits for tasks assessed/performed Arousal/Alertness: Awake/alert Orientation Level: Appears intact for tasks assessed Behavior During Session: [facility] for tasks performed    Extremity/Trunk Assessment Right Upper Extremity Assessment RUE ROM/Strength/Tone: [facility] for tasks assessed Left Upper Extremity Assessment LUE ROM/Strength/Tone: [facility] for tasks assessed Right Lower Extremity Assessment RLE ROM/Strength/Tone: [facility] for tasks assessed Left Lower Extremity Assessment LLE ROM/Strength/Tone: WFL for tasks assessed   Balance    End of Session PT - End  of Session Equipment Utilized During Treatment: Oxygen Activity Tolerance: Patient tolerated treatment  well Patient left: in bed;with call bell/phone within reach;with family/visitor present  GP Functional Assessment Tool Used: clinical judgement Functional Limitation: Mobility: Walking and moving around Mobility: Walking and Moving Around Current Status ([id number]): At least 1 percent but less than 20 percent impaired, limited or restricted Mobility: Walking and Moving Around Goal Status [id number]): 0 percent impaired, limited or restricted   Cattleya Dobratz,KATHrine E 06/15/2012, 2:08 PM Pager: [phone]

## 2012-06-15 NOTE — Progress Notes (Signed)
Attempted to ambulate pt at this time, pt refused.  Stated "PT just ambulate me about 40 minutes and don't feel like walking again."  Dr. Elenora Gamma notified and instructed to ambulate again in 2 hr. Amanda Pea, Charity fundraiser.

## 2012-06-15 NOTE — Progress Notes (Signed)
Second attempt to ambulate patient, patient eating dinner. Will attempt again later.

## 2012-06-16 DIAGNOSIS — J9610 Chronic respiratory failure, unspecified whether with hypoxia or hypercapnia: Secondary | ICD-10-CM

## 2012-06-16 DIAGNOSIS — E871 Hypo-osmolality and hyponatremia: Principal | ICD-10-CM

## 2012-06-16 DIAGNOSIS — J9611 Chronic respiratory failure with hypoxia: Secondary | ICD-10-CM | POA: Diagnosis present

## 2012-06-16 DIAGNOSIS — J449 Chronic obstructive pulmonary disease, unspecified: Secondary | ICD-10-CM

## 2012-06-16 LAB — GLUCOSE, CAPILLARY: Glucose-Capillary: 154 mg/dL — ABNORMAL HIGH (ref 70–99)

## 2012-06-16 LAB — BASIC METABOLIC PANEL
CO2: 31 mEq/L (ref 19–32)
Chloride: 100 mEq/L (ref 96–112)
Sodium: 138 mEq/L (ref 135–145)

## 2012-06-16 MED ORDER — FUROSEMIDE 20 MG PO TABS: 20.0000 mg | ORAL_TABLET | ORAL | Status: DC

## 2012-06-16 MED ORDER — WARFARIN SODIUM 2.5 MG PO TABS
2.5000 mg | ORAL_TABLET | Freq: Once | ORAL | Status: AC
  Administered 2012-06-16: 2.5 mg via ORAL
  Filled 2012-06-16: qty 1

## 2012-06-16 MED ORDER — IPRATROPIUM-ALBUTEROL 18-103 MCG/ACT IN AERO
2.0000 | INHALATION_SPRAY | RESPIRATORY_TRACT | Status: DC
  Administered 2012-06-16 (×4): 2 via RESPIRATORY_TRACT

## 2012-06-16 MED ORDER — IPRATROPIUM-ALBUTEROL 18-103 MCG/ACT IN AERO: 2.0000 | INHALATION_SPRAY | Freq: Four times a day (QID) | RESPIRATORY_TRACT | Status: DC

## 2012-06-16 MED ORDER — ASPIRIN 81 MG PO TBEC: 81.0000 mg | DELAYED_RELEASE_TABLET | Freq: Every day | ORAL | Status: DC

## 2012-06-16 NOTE — Progress Notes (Signed)
Instructed by Dr. Elenora Gamma EKG not need.  Amanda Pea, Charity fundraiser.

## 2012-06-16 NOTE — Progress Notes (Signed)
06/15/12 2326 RT was called because patient has orders for cpap. Patient wears cpap at home but did not bring with her upon admission. RT initially tired to get patient to wear cpap but patient refused. Spoke with patient and encouraged her to wear cpap and patient agreed. At 0040 patient had her cpap off and said that she did not want to put cpap back on. Patient said that her cpap at home went over her nose only. I offered to call RT again to see if she would like to try different cpap  but patient refused. She insisted that she didn't want to wear it. Patient currently has on 2 L of Oxygen nasal canula.

## 2012-06-16 NOTE — Progress Notes (Signed)
Subjective:  Gina Ashley reports feeling better this morning. Slept well last night and feels ready to go home. Patient denies chest pain, feeling short of breath.  Reports that she has been intermittently short of breath at home for the past 2 years since moving into her new house, which is in [place] old renovated factory. She also states that dust from the ceiling often covers her tables and countertops at home and she feels this may be related.  Objective: Vital signs in last 24 hours: Filed Vitals:   06/16/12 1113 06/16/12 1114 06/16/12 1222 06/16/12 1431  BP: 120/58 120/58  124/52  Pulse:    70  Temp:    98 F (36.7 C)  TempSrc:    Oral  Resp:    18  Height:      Weight:      SpO2:   94% 97%   Weight change: 1 lb 8.7 oz (0.7 kg)  Intake/Output Summary (Last 24 hours) at 06/16/12 1457 Last data filed at 06/16/12 1258  Gross per 24 hour  Intake    980 ml  Output    850 ml  Net    130 ml   Physical Exam  General: No acute distress, alert and oriented x 3, well-appearing  HEENT: PERRL, EOMI, no lymphadenopathy, moist mucous membranes  Cardiovascular: Irregular rhythm, no murmurs, rubs or gallops  Respiratory: Clear to auscultation bilaterally, decreased breath sounds, no wheezes, rales, or rhonchi  Abdomen: Soft, obese, nondistended, nontender, normoactive bowel sounds  Extremities: Warm and well-perfused, no clubbing, cyanosis, trace edema.  Skin: Warm, dry, no rashes  Neuro: Not anxious appearing, no depressed mood, normal affect   Lab Results: Basic Metabolic Panel:  Lab 06/16/12 1610 06/15/12 0625  NA 138 138  K 4.6 4.3  CL 100 99  CO2 31 33*  GLUCOSE 129* 127*  BUN 37* 46*  CREATININE 1.56* 1.72*  CALCIUM 9.6 9.5  MG -- --  PHOS -- --    Lab 06/14/12 2018  LIPASE 39  AMYLASE --   CBC:  Lab 06/15/12 0625 06/14/12 1530  WBC 5.1 7.8  NEUTROABS -- 6.1  HGB 8.9* 10.4*  HCT 27.8* 32.5*  MCV 80.6 80.8  PLT 124* 176   Cardiac Enzymes:  Lab 06/15/12  1156 06/14/12 1948  CKTOTAL 42 48  CKMB 1.2 1.3  CKMBINDEX -- --  TROPONINI <0.30 <0.30   CBG:  Lab 06/16/12 1130 06/16/12 0625 06/15/12 2224 06/15/12 1611 06/15/12 1112 06/15/12 0635  GLUCAP 154* 117* 156* 139* 174* 124*   Hemoglobin A1C:  Lab 06/14/12 1948  HGBA1C 6.7*   Thyroid Function Tests:  Lab 06/15/12 1156 06/14/12 2018  TSH -- 0.326*  T4TOTAL -- --  FREET4 1.63 --  T3FREE -- --  THYROIDAB -- --   Coagulation:  Lab 06/16/12 0545 06/15/12 0625 06/14/12 1743  LABPROT 31.2* 33.3* 30.6*  INR 2.95* 3.21* 2.88*   Urinalysis:  Lab 06/14/12 1856  COLORURINE YELLOW  LABSPEC 1.010  PHURINE 7.0  GLUCOSEU NEGATIVE  HGBUR NEGATIVE  BILIRUBINUR NEGATIVE  KETONESUR NEGATIVE  PROTEINUR NEGATIVE  UROBILINOGEN 0.2  NITRITE NEGATIVE  LEUKOCYTESUR MODERATE*    Studies/Results: Dg Chest 2 View  06/14/2012  *RADIOLOGY REPORT*  Clinical Data: Chest pain, shortness of breath  CHEST - 2 VIEW  Comparison: 05/21/2012  Findings: Cardiomegaly with pulmonary vascular congestion and possible mild interstitial edema, although improved from the prior study.  No definite pleural effusion.  No pneumothorax.  Degenerative changes of the visualized thoracolumbar spine.  Stable calcification overlying the left neck.  IMPRESSION: Cardiomegaly with pulmonary vascular congestion.  Possible mild interstitial edema, although improved from the prior study.  Original Report Authenticated By: Charline Bills, M.D.   Medications:  Scheduled Meds:    . albuterol-ipratropium  2 puff Inhalation Q4H  . amLODipine  10 mg Oral Daily  . antiseptic oral rinse  15 mL Mouth Rinse BID  . aspirin EC  81 mg Oral Daily  . cholestyramine light  4 g Oral TID  . clotrimazole   Topical BID  . gabapentin  300 mg Oral BID  . hydrALAZINE  50 mg Oral TID  . insulin aspart  0-9 Units Subcutaneous TID WC  . iron polysaccharides  150 mg Oral Daily  . isosorbide dinitrate  20 mg Oral TID  . olopatadine  1 drop  Both Eyes Daily  . pantoprazole  40 mg Oral Q1200  . potassium chloride  10 mEq Oral BID  . sotalol  40 mg Oral BID  . vitamin B-12  1,000 mcg Oral BID  . Vitamin D (Ergocalciferol)  50,000 Units Oral 2 times weekly  . warfarin  2.5 mg Oral ONCE-1800  . Warfarin - Pharmacist Dosing Inpatient   Does not apply q1800   Continuous Infusions:  PRN Meds:.acetaminophen, acetaminophen, albuterol-ipratropium, alum & mag hydroxide-simeth, HYDROcodone-acetaminophen, ondansetron (ZOFRAN) IV, ondansetron, senna-docusate, traZODone    Assessment/Plan:  78 year old woman with past medical history significant for atrial fibrillation, hypertension, CKD stage III comes to the ER with the complaints of generalized weakness, lethargy and shortness of breath for about a week. She reports that she was recently admitted [facility] for heat stroke and has not been feeling well since her discharge.   # Generalized weakness and lethargy: Likely related to dehydration and electrolyte disturbances with her home dose of diuretic being too high (also consider deconditioning vs anemia). Cycled cardiac enzymes negative.   *suspect patient came in volume contracted, taking higher than required diuretic dose, especially given recent switch to more potent torsemide and low salt diet - will d/c with small dose of lasix (20mg  every other day) and have her titrate dose with PCP with f/u weights and BMP  #Oxygen requirement: Patient uses home O2 PRN, patient not aware of underlying diagnosis -ABG 7.324/63/64/31 -patient retaining CO2, decreased breath sounds, suspect underlying COPD, will start combivent -pt's O2 sats dropped to 71% on RA after ambulating, will go home with 2L O2 by [place] all the time, not just PRN -can d/c with combivent and PCP follow up  #. Hyponatremia: Resolved. Etiology unclear but this could be related to mild dehydration/low salt diet. She has sodium of 128 on presentation and appeared  hypovolemic - Na normalized  #. Atrial fibrillation: rate controlled. Her HR is 60s-80s. Her last 2D echo August 2012 showed normal LV systolic function with evidence of moderate to severe pulmonary hypertension without significant valvular abnormalities. She did undergo a pharmacologic nuclear stress test which showed no evidence of ischemia  - Continue sotalol and Coumadin -patient has history of 1st degree AV block and has been intermittently bradycardic. No chest pain, no dizziness. -f/u with PCP  # CKD stage III: Baseline Cr is not known. She presented with Cr of 1.96. At recent hospitalization, Cr was 2.74. - Continue to monitor.   # HTN: BP stable.  Continue home meds- hydralazine and amlodipine. Hold Lisinopril for now.   # Type 2 DM:  Continue SSI, home meds  # OSA: CPAP at night   #  DVT: Coumadin per pahrmacy  Dispo -patient is stable, ready for dc to home with daughter to help -f/u appointment with PCP scheduled    LOS: 2 days   Denton Ar 06/16/2012, 2:57 PM

## 2012-06-16 NOTE — Progress Notes (Signed)
ANTICOAGULATION CONSULT NOTE - Follow Up Consult  Pharmacy Consult for Coumadin Indication: atrial fibrillation  Allergies  Allergen Reactions  . Morphine Sulfate Other (See Comments)    REACTION: change in personality  . Percocet (Oxycodone-Acetaminophen) Other (See Comments)    Does not relieve the pain  . Penicillins Rash   Vital Signs: Temp: 98 F (36.7 C) (08/13 0651) Temp src: Oral (08/13 0651) BP: 121/48 mmHg (08/13 0651) Pulse Rate: 57  (08/13 0651)  Labs:  Gina Ashley 06/16/12 0815 06/16/12 0545 06/15/12 1156 06/15/12 0625 06/14/12 1948 06/14/12 1743 06/14/12 1530  HGB -- -- -- 8.9* -- -- 10.4*  HCT -- -- -- 27.8* -- -- 32.5*  PLT -- -- -- 124* -- -- 176  APTT -- -- -- -- -- -- --  LABPROT -- 31.2* -- 33.3* -- 30.6* --  INR -- 2.95* -- 3.21* -- 2.88* --  HEPARINUNFRC -- -- -- -- -- -- --  CREATININE 1.56* -- -- 1.72* -- -- 1.96*  CKTOTAL -- -- 42 -- 48 -- --  CKMB -- -- 1.2 -- 1.3 -- --  TROPONINI -- -- <0.30 -- <0.30 -- --    Estimated Creatinine Clearance: 30.9 ml/min (by C-G formula based on Cr of 1.56).  Assessment: 85yof continues on coumadin with an INR that is back within therapeutic range after dose held 8/12. Can resume at a lower dose today. No CBC. No bleeding noted.  Goal of Therapy:  INR 2-3  Plan:  1) Coumadin 2.5mg  x 1 2) Follow up INR in AM  Fredrik Rigger 06/16/2012,9:47 AM

## 2012-06-16 NOTE — Progress Notes (Signed)
Pt placed on auto cpap on full face mask. Max 15.0 and min 5.0. Pt tolerating well at this time

## 2012-06-16 NOTE — Progress Notes (Signed)
Pt 02 sat 84%-92% on RA sitting and 84% with ambulation.  Dr. Elenora Gamma informed.  Amanda Pea, Charity fundraiser.

## 2012-06-16 NOTE — Discharge Summary (Signed)
Internal Medicine Teaching [facility] Discharge Note  Name: Gina Ashley MRN: [id number] DOB: [birth year] 78 y.o.  Date of Admission: 06/14/2012  4:30 PM Date of Discharge: 06/23/2012 Attending Physician: Dr. Kem Kays  Discharge Diagnosis: Principal Problem:  *Fatigue Active Problems:  Hypovolemia dehydration  COPD (chronic obstructive pulmonary disease)  Chronic hypoxemic respiratory failure   Discharge Medications: Medication List  As of 06/23/2012  1:33 PM   STOP taking these medications         torsemide 20 MG tablet         TAKE these medications         albuterol-ipratropium 18-103 MCG/ACT inhaler   Commonly known as: COMBIVENT   Inhale 2 puffs into the lungs 4 (four) times daily.      amLODipine 10 MG tablet   Commonly known as: NORVASC   Take 10 mg by mouth daily.      aspirin 81 MG EC tablet   Take 1 tablet (81 mg total) by mouth daily.      cholestyramine light 4 G packet   Commonly known as: PREVALITE   Take 4 g by mouth 3 (three) times daily as needed. For bowels      clotrimazole-betamethasone cream   Commonly known as: LOTRISONE   Apply 1 application topically 2 (two) times daily as needed. For rash      ergocalciferol 50000 UNITS capsule   Commonly known as: VITAMIN D2   Take 50,000 Units by mouth 2 (two) times a week. On Sunday and Thursday      furosemide 20 MG tablet   Commonly known as: LASIX   Take 1 tablet (20 mg total) by mouth every other day.      gabapentin 300 MG capsule   Commonly known as: NEURONTIN   Take 300 mg by mouth 2 (two) times daily.      hydrALAZINE 50 MG tablet   Commonly known as: APRESOLINE   Take 50 mg by mouth 3 (three) times daily.      HYDROcodone-acetaminophen 10-500 MG per tablet   Commonly known as: LORTAB   Take 1 tablet by mouth every 4 (four) hours as needed. For pain      iron polysaccharides 150 MG capsule   Commonly known as: NIFEREX   Take 150 mg by mouth daily.      isosorbide dinitrate 20 MG  tablet   Commonly known as: ISORDIL   Take 20 mg by mouth 3 (three) times daily.      lisinopril 40 MG tablet   Commonly known as: PRINIVIL,ZESTRIL   Take 10-20 mg by mouth 2 (two) times daily. Take 1 tablet (20mg ) in the morning and one-half tablet (10mg ) in the evening      meclizine 50 MG tablet   Commonly known as: ANTIVERT   Take 12.5-25 mg by mouth 3 (three) times daily as needed. For dizziness      omeprazole 40 MG capsule   Commonly known as: PRILOSEC   Take 40 mg by mouth daily.      PATADAY 0.2 % Soln   Generic drug: Olopatadine HCl   Apply 1 drop to eye daily.      potassium chloride 10 MEQ tablet   Commonly known as: K-DUR   Take 10 mEq by mouth 2 (two) times daily.      sitaGLIPtin 50 MG tablet   Commonly known as: JANUVIA   Take 50 mg by mouth daily.      sotalol 120 MG tablet  Commonly known as: BETAPACE   Take 40 mg by mouth 2 (two) times daily.      traZODone 50 MG tablet   Commonly known as: DESYREL   Take 100 mg by mouth at bedtime as needed. For sleep      vitamin B-12 1000 MCG tablet   Commonly known as: CYANOCOBALAMIN   Take 1,000 mcg by mouth 2 (two) times daily.      VITRON-C 65-125 MG Tabs   Generic drug: Iron-Vitamin C   Take 1 tablet by mouth 2 (two) times daily.      warfarin 5 MG tablet   Commonly known as: COUMADIN   Take 5-10 mg by mouth daily. Take 5mg  (1 tablet) on Sunday, Tuesday, and Thursday. On Monday, Wednesday, Friday, and Saturday, take 7.5mg  (1 1/2 tablets).            Disposition and follow-up:   GinaZuley Schlotzhauer was discharged from [facility] in Stable condition.  At the hospital follow up visit please address the following issues  - INR check -PFTs for formal COPD diagnosis - CBC to evaluate and work up for anemia - BMP  - titrate Lasix dose as needed, follow weights and Cr - hyperthyroid work up if clinically indicated (TSH was .34 here) -Possible antigen exposure at home ? Worsening lung  disease  Follow-up Appointments: Follow-up Information    Follow up with Ernestine Conrad, MD. (Monday Aug 19 at 11 am for INR check and hospital follow up)    Contact information:   [facility] [address] [place] [address] [phone]           Procedures Performed:  Dg Chest 2 View  06/14/2012  *RADIOLOGY REPORT*  Clinical Data: Chest pain, shortness of breath  CHEST - 2 VIEW  Comparison: 05/21/2012  Findings: Cardiomegaly with pulmonary vascular congestion and possible mild interstitial edema, although improved from the prior study.  No definite pleural effusion.  No pneumothorax.  Degenerative changes of the visualized thoracolumbar spine.  Stable calcification overlying the left neck.  IMPRESSION: Cardiomegaly with pulmonary vascular congestion.  Possible mild interstitial edema, although improved from the prior study.  Original Report Authenticated By: Charline Bills, M.D.     Admission HPI: Gina Ashley is an 78 yo female with a history of diabetes, Afib on coumadin, Pulmonary HTN, GERD, HTN, CKD stage 3, and OSA on CPAP, who presents with 1 week of weakness, shortness of breath, and chest pain. She states that 2 weeks ago she was admitted to [facility] hospital with "sun stroke" where she was told she had "fluid build up". Over the past week, she has had increased weakness and fatigue and unable to do her normal activities without getting short of breath. She also says she feels "funny" in her chest under her left breast, and she "doesn't feel right". Not able to describe the pain very well. She thinks it might be worse when we stands up. She had an isolated episode of nausea 4 days ago but she denies any vomiting. She says her appetite has been good recently, no recent illnesses. She has noticed that her belly size has been increasing in the past new months. Recently switched from lasix to torsemide by her PCP 2 weeks ago. Daughter states that she was  started on a very low sodium diet 2-3 weeks ago.  Patient reports having 3 previous heart catheterizations that were normal, no stents, no interventions.  ROS + for cough (chronic),  chronic constipation.  No dysuria, no fever or chills. No pillow orthopnea. No diarrhea.   Hospital Course by problem list: Principal Problem:  *Fatigue Active Problems:  Hypovolemia dehydration  COPD (chronic obstructive pulmonary disease)  Chronic hypoxemic respiratory failure   Hypovolemia/dehydration: Patient presented with generalized weakness and lethargy. Likely related to dehydration and electrolyte disturbances with her home dose of diuretic being too high (also consider deconditioning vs anemia) as well as her recent extreme low salt diet. Cycled cardiac enzymes were negative. EKG showed sinus rhythm with first degree AV block, PVCs. Patient appeared quite dry. Patient was discharged with instructions to take 20 mg Lasix every other day and followup closely with her PCP for her proper titration of this medication. On the day of discharge, patient was feeling much better and was able to ambulate down the hall with oxygen delivery by nasal cannula without difficulty.  COPD: Patient uses home O2 as needed when she gets short of breath on exertion, though she is not aware of an underlying diagnosis. Patient had severely decreased breath sounds on exam and she often required oxygen to maintain oxygen saturation above 92%. ABG results were 7.324/63/64/31 suggesting some chronic CO2 retention. We suspect this patient has underlying COPD. Patient's oxygen saturation dropped to 70s during ambulation. She was sent home with 2 L of oxygen by nasal cannula at all times. This can be reassessed at her PCP , at which time he recommended formal PFTs. Patient was also sent home with Combivent inhaler.  Hyponatremia: Patient presented with a sodium of 128. This may have contributed to her symptoms of weakness and fatigue.  Etiology unclear but this could be related to mild dehydration/low salt diet.  This resolved, and on the day of discharge her sodium was 138.   Atrial fibrillation: rate controlled. Patient presented in normal sinus rhythm with first degree AV block on EKG. She was intermittently bradycardic during this hospitalization. She was asymptomatic, no chest pain, no dizziness. On the day of discharge, her HR was 60s-80s. Her last 2D echo August 2012 showed normal LV systolic function with evidence of moderate to severe pulmonary hypertension without significant valvular abnormalities. She did undergo a pharmacologic nuclear stress test which showed no evidence of ischemia. She was continued on sotalol and Coumadin and should followup with her PCP for this issue.   Discharge Vitals:  BP 136/71  Pulse 70  Temp 98 F (36.7 C) (Oral)  Resp 18  Ht 5\' 3"  (1.6 m)  Wt 221 lb 5.5 oz (100.4 kg)  BMI 39.21 kg/m2  SpO2 95%  Signed: Denton Ar 06/23/2012, 1:33 PM   Time Spent on Discharge: 30 minutes

## 2012-06-16 NOTE — Progress Notes (Signed)
INTERNAL MEDICINE TEACHING SERVICE Attending Note  Date: 06/16/2012  Patient name: Gina Ashley  Medical record number: [id number]  Date of birth: 09/04/1927    This patient has been seen and discussed with the house staff. Please see their note for complete details. I concur with their findings with the following additions/corrections: Patient ambulated this afternoon with O2 sats of 84% on ambulation. She has chronic hypoxic/hypercarbic respiratory failure likely due to COPD. She feels better today. Her fatigue is improved. Offers no other complaints.  A/P: Patient is a 78 y.o., female with a PMHX significant for pulmonary HTN, atrial fib on coumadin therapy, CKD 3, HTN, GERD, OSA with night CPAP, NIDDM type 2, presented with fatigue and CP. 1) Fatigue: This is likely a result of hypovolemia. She has restricted her sodium intake and was recently given torsemide, which replaced furosemide. She feels better today. I would lower her dose to lasix 20 mg PO every other day and follow up with PCP. 2) Atypical CP: Ruled out for ACS. She has mild AS and likely pulm HTN. This is likely secondary to pulmonary process. No recurrence. No relation with exertion.  3)COPD & Chronic hypoxic/hypercarbic respiratory failure: Reviewed blood gas and O2 sat with ambulation. I would send her home on 2L/min O2 with exertion and continue with night CPAP with O2 at 2L. I would add combivent to her regimen. 4) CKD 3: Likely at baseline. Follow up with PCP. 5) Afib: pharmacy to dose coumadin. Rate controlled.  CM for home O2 setup. She can be discharged home, medically stable. -rest per resident physician note.    Jonah Blue, DO  06/16/2012, 4:33 PM

## 2012-06-16 NOTE — Progress Notes (Signed)
All d/c orders explained and given to pt and her daughter.  Verbalized understanding.  Amanda Pea, Charity fundraiser.

## 2012-06-16 NOTE — Progress Notes (Signed)
Physical Therapy Treatment Patient Details Name: Gina Ashley MRN: [id number] DOB: 05-23-1927 Today's Date: 06/16/2012 Time: 2536-6440 PT Time Calculation (min): 20 min  PT Assessment / Plan / Recommendation Comments on Treatment Session  Pt progressing with PT goals & mobility.  Trialed stair training today & activity without supplemental 02.  02 sats dropped to 71% RA after ambulating.  Pt on 3L 02 before & after session    Follow Up Recommendations  No PT follow up    Barriers to Discharge        Equipment Recommendations  None recommended by PT    Recommendations for Other Services    Frequency Min 3X/week   Plan Discharge plan remains appropriate    Precautions / Restrictions Precautions Precautions: None Restrictions Weight Bearing Restrictions: No       Mobility  Bed Mobility Bed Mobility: Supine to Sit;Sitting - Scoot to Edge of Bed Supine to Sit: 6: Modified independent (Device/Increase time) Sitting - Scoot to Edge of Bed: 6: Modified independent (Device/Increase time) Transfers Transfers: Sit to Stand;Stand to Sit Sit to Stand: 5: Supervision;With upper extremity assist;With armrests;From bed;From chair/3-in-1 Stand to Sit: 5: Supervision;With upper extremity assist;With armrests;To chair/3-in-1 Details for Transfer Assistance: cues for safe hand placement & ensure safe body positioning before sitting.   Ambulation/Gait Ambulation/Gait Assistance: 4: Min guard Ambulation Distance (Feet): 200 Feet Assistive device: Straight cane;Rolling walker Ambulation/Gait Assistance Details: Initiated ambulation with SPC but then pt requesting use of RW for increased stability.  Denies SOB but required 2-3 standing rest breaks throughout.  Trialed ambulation without supplemental 02- sats dropped to 71% RA when returned back to room.   Gait Pattern: Step-through pattern;Decreased stride length (decreased step height. ) Gait velocity: decreased General Gait Details: Pt  requesting use of RW.  She states she has RW at home.  Stairs: Yes Stairs Assistance: 4: Min assist Stairs Assistance Details (indicate cue type and reason): assist for balance & support.  Cues for safe technique.  Appeared to be slightly effortful & increased fatigue noted.   Stair Management Technique: One rail Left;Forwards;Other (comment) (HHA) Number of Stairs:  Firefighter) Wheelchair Mobility Wheelchair Mobility: No      PT Goals Acute Rehab PT Goals Time For Goal Achievement: 06/22/12 Potential to Achieve Goals: Good PT Goal: Sit to Stand - Progress: Progressing toward goal PT Goal: Stand to Sit - Progress: Progressing toward goal PT Goal: Ambulate - Progress: Progressing toward goal PT Goal: Up/Down Stairs - Progress: Progressing toward goal  Visit Information  Last PT Received On: 06/16/12 Assistance Needed: +1    Subjective Data      Cognition  Overall Cognitive Status: Appears within functional limits for tasks assessed/performed Arousal/Alertness: Awake/alert Orientation Level: Appears intact for tasks assessed Behavior During Session: [facility] for tasks performed    Balance     End of Session PT - End of Session Equipment Utilized During Treatment: Gait belt Activity Tolerance: Patient tolerated treatment well Patient left: in chair;with call bell/phone within reach Nurse Communication: Mobility status     Verdell Face, [place] [phone] 06/16/2012

## 2012-06-23 NOTE — Discharge Summary (Signed)
INTERNAL MEDICINE TEACHING SERVICE Attending Note  Date: 06/23/2012  Patient name: Gina Ashley  Medical record number: [id number]  Date of birth: Jul 21, 1927    This patient has been seen and discussed with the house staff. Please see their note for complete details. I concur with their findings and plan. See my note on date of discharge.   Jonah Blue, DO  06/23/2012, 2:24 PM

## 2012-06-28 DIAGNOSIS — I509 Heart failure, unspecified: Secondary | ICD-10-CM

## 2012-07-02 DIAGNOSIS — I509 Heart failure, unspecified: Secondary | ICD-10-CM

## 2012-07-20 DIAGNOSIS — I4891 Unspecified atrial fibrillation: Secondary | ICD-10-CM

## 2012-07-26 DIAGNOSIS — I509 Heart failure, unspecified: Secondary | ICD-10-CM

## 2012-07-27 DIAGNOSIS — I5033 Acute on chronic diastolic (congestive) heart failure: Secondary | ICD-10-CM

## 2012-08-04 DIAGNOSIS — Z9289 Personal history of other medical treatment: Secondary | ICD-10-CM

## 2012-08-04 DIAGNOSIS — W19XXXA Unspecified fall, initial encounter: Secondary | ICD-10-CM

## 2012-08-04 HISTORY — DX: Unspecified fall, initial encounter: W19.XXXA

## 2012-08-04 HISTORY — DX: Personal history of other medical treatment: Z92.89

## 2012-08-04 HISTORY — PX: TIBIA FRACTURE SURGERY: SHX806

## 2012-08-04 HISTORY — PX: FEMUR FRACTURE SURGERY: SHX633

## 2012-08-23 ENCOUNTER — Emergency Department (HOSPITAL_COMMUNITY): Payer: Medicare Other

## 2012-08-23 ENCOUNTER — Inpatient Hospital Stay (HOSPITAL_COMMUNITY): Payer: Medicare Other

## 2012-08-23 ENCOUNTER — Inpatient Hospital Stay (HOSPITAL_COMMUNITY)
Admission: EM | Admit: 2012-08-23 | Discharge: 2012-08-25 | DRG: 069 | Disposition: A | Discharge status: Skilled Nursing Facility | Payer: Medicare Other | Attending: Internal Medicine | Admitting: Internal Medicine

## 2012-08-23 ENCOUNTER — Encounter (HOSPITAL_COMMUNITY): Payer: Self-pay | Admitting: Emergency Medicine

## 2012-08-23 DIAGNOSIS — I4891 Unspecified atrial fibrillation: Secondary | ICD-10-CM | POA: Diagnosis present

## 2012-08-23 DIAGNOSIS — N189 Chronic kidney disease, unspecified: Secondary | ICD-10-CM

## 2012-08-23 DIAGNOSIS — R471 Dysarthria and anarthria: Secondary | ICD-10-CM | POA: Diagnosis present

## 2012-08-23 DIAGNOSIS — E871 Hypo-osmolality and hyponatremia: Secondary | ICD-10-CM

## 2012-08-23 DIAGNOSIS — R5383 Other fatigue: Secondary | ICD-10-CM

## 2012-08-23 DIAGNOSIS — N183 Chronic kidney disease, stage 3 unspecified: Secondary | ICD-10-CM | POA: Diagnosis present

## 2012-08-23 DIAGNOSIS — I272 Pulmonary hypertension, unspecified: Secondary | ICD-10-CM

## 2012-08-23 DIAGNOSIS — E861 Hypovolemia: Secondary | ICD-10-CM

## 2012-08-23 DIAGNOSIS — I480 Paroxysmal atrial fibrillation: Secondary | ICD-10-CM | POA: Diagnosis present

## 2012-08-23 DIAGNOSIS — G459 Transient cerebral ischemic attack, unspecified: Secondary | ICD-10-CM

## 2012-08-23 DIAGNOSIS — J9610 Chronic respiratory failure, unspecified whether with hypoxia or hypercapnia: Secondary | ICD-10-CM | POA: Diagnosis present

## 2012-08-23 DIAGNOSIS — J4489 Other specified chronic obstructive pulmonary disease: Secondary | ICD-10-CM | POA: Diagnosis present

## 2012-08-23 DIAGNOSIS — J309 Allergic rhinitis, unspecified: Secondary | ICD-10-CM

## 2012-08-23 DIAGNOSIS — D638 Anemia in other chronic diseases classified elsewhere: Secondary | ICD-10-CM | POA: Diagnosis present

## 2012-08-23 DIAGNOSIS — F3289 Other specified depressive episodes: Secondary | ICD-10-CM | POA: Diagnosis present

## 2012-08-23 DIAGNOSIS — J9611 Chronic respiratory failure with hypoxia: Secondary | ICD-10-CM

## 2012-08-23 DIAGNOSIS — N039 Chronic nephritic syndrome with unspecified morphologic changes: Secondary | ICD-10-CM | POA: Diagnosis present

## 2012-08-23 DIAGNOSIS — I129 Hypertensive chronic kidney disease with stage 1 through stage 4 chronic kidney disease, or unspecified chronic kidney disease: Secondary | ICD-10-CM | POA: Diagnosis present

## 2012-08-23 DIAGNOSIS — E119 Type 2 diabetes mellitus without complications: Secondary | ICD-10-CM | POA: Diagnosis present

## 2012-08-23 DIAGNOSIS — G4700 Insomnia, unspecified: Secondary | ICD-10-CM

## 2012-08-23 DIAGNOSIS — E875 Hyperkalemia: Secondary | ICD-10-CM | POA: Diagnosis present

## 2012-08-23 DIAGNOSIS — I10 Essential (primary) hypertension: Secondary | ICD-10-CM | POA: Diagnosis present

## 2012-08-23 DIAGNOSIS — J449 Chronic obstructive pulmonary disease, unspecified: Secondary | ICD-10-CM | POA: Diagnosis present

## 2012-08-23 DIAGNOSIS — D631 Anemia in chronic kidney disease: Secondary | ICD-10-CM | POA: Diagnosis present

## 2012-08-23 DIAGNOSIS — Z8679 Personal history of other diseases of the circulatory system: Secondary | ICD-10-CM

## 2012-08-23 DIAGNOSIS — G4733 Obstructive sleep apnea (adult) (pediatric): Secondary | ICD-10-CM | POA: Diagnosis present

## 2012-08-23 DIAGNOSIS — N179 Acute kidney failure, unspecified: Secondary | ICD-10-CM | POA: Diagnosis present

## 2012-08-23 DIAGNOSIS — K219 Gastro-esophageal reflux disease without esophagitis: Secondary | ICD-10-CM | POA: Diagnosis present

## 2012-08-23 DIAGNOSIS — F329 Major depressive disorder, single episode, unspecified: Secondary | ICD-10-CM | POA: Diagnosis present

## 2012-08-23 LAB — COMPREHENSIVE METABOLIC PANEL
ALT: 19 U/L (ref 0–35)
AST: 21 U/L (ref 0–37)
Albumin: 3.8 g/dL (ref 3.5–5.2)
Alkaline Phosphatase: 136 U/L — ABNORMAL HIGH (ref 39–117)
BUN: 62 mg/dL — ABNORMAL HIGH (ref 6–23)
CO2: 33 mEq/L — ABNORMAL HIGH (ref 19–32)
Calcium: 9.5 mg/dL (ref 8.4–10.5)
Chloride: 87 mEq/L — ABNORMAL LOW (ref 96–112)
Creatinine, Ser: 2.3 mg/dL — ABNORMAL HIGH (ref 0.50–1.10)
GFR calc Af Amer: 21 mL/min — ABNORMAL LOW (ref >90)
GFR calc non Af Amer: 19 mL/min — ABNORMAL LOW (ref >90)
Glucose, Bld: 164 mg/dL — ABNORMAL HIGH (ref 70–99)
Potassium: 5.3 mEq/L — ABNORMAL HIGH (ref 3.5–5.1)
Sodium: 127 mEq/L — ABNORMAL LOW (ref 135–145)
Total Bilirubin: 0.4 mg/dL (ref 0.3–1.2)
Total Protein: 7.6 g/dL (ref 6.0–8.3)

## 2012-08-23 LAB — URINE MICROSCOPIC-ADD ON

## 2012-08-23 LAB — RAPID URINE DRUG SCREEN, HOSP PERFORMED
Amphetamines: NOT DETECTED
Barbiturates: NOT DETECTED
Tetrahydrocannabinol: NOT DETECTED

## 2012-08-23 LAB — DIFFERENTIAL
Basophils Absolute: 0 10*3/uL (ref 0.0–0.1)
Basophils Relative: 0 % (ref 0–1)
Eosinophils Absolute: 0.2 10*3/uL (ref 0.0–0.7)
Eosinophils Relative: 3 % (ref 0–5)
Lymphocytes Relative: 12 % (ref 12–46)
Lymphs Abs: 0.9 10*3/uL (ref 0.7–4.0)
Monocytes Absolute: 0.5 10*3/uL (ref 0.1–1.0)
Monocytes Relative: 7 % (ref 3–12)
Neutro Abs: 5.7 10*3/uL (ref 1.7–7.7)
Neutrophils Relative %: 79 % — ABNORMAL HIGH (ref 43–77)

## 2012-08-23 LAB — URINALYSIS, ROUTINE W REFLEX MICROSCOPIC
Bilirubin Urine: NEGATIVE
Glucose, UA: NEGATIVE mg/dL
Hgb urine dipstick: NEGATIVE
Ketones, ur: NEGATIVE mg/dL
Nitrite: NEGATIVE
Protein, ur: NEGATIVE mg/dL
Specific Gravity, Urine: 1.012 (ref 1.005–1.030)
Urobilinogen, UA: 0.2 mg/dL (ref 0.0–1.0)
pH: 5 (ref 5.0–8.0)

## 2012-08-23 LAB — CBC
HCT: 31.7 % — ABNORMAL LOW (ref 36.0–46.0)
Hemoglobin: 9.8 g/dL — ABNORMAL LOW (ref 12.0–15.0)
MCH: 25.8 pg — ABNORMAL LOW (ref 26.0–34.0)
MCHC: 30.9 g/dL (ref 30.0–36.0)
MCV: 83.4 fL (ref 78.0–100.0)
Platelets: 220 10*3/uL (ref 150–400)
RBC: 3.8 MIL/uL — ABNORMAL LOW (ref 3.87–5.11)
RDW: 16.7 % — ABNORMAL HIGH (ref 11.5–15.5)
WBC: 7.2 10*3/uL (ref 4.0–10.5)

## 2012-08-23 LAB — BASIC METABOLIC PANEL
Chloride: 90 mEq/L — ABNORMAL LOW (ref 96–112)
GFR calc Af Amer: 23 mL/min — ABNORMAL LOW (ref >90)
GFR calc non Af Amer: 20 mL/min — ABNORMAL LOW (ref >90)
Potassium: 5.3 mEq/L — ABNORMAL HIGH (ref 3.5–5.1)
Sodium: 130 mEq/L — ABNORMAL LOW (ref 135–145)

## 2012-08-23 LAB — APTT: aPTT: 39 seconds — ABNORMAL HIGH (ref 24–37)

## 2012-08-23 LAB — PROTIME-INR
INR: 1.98 — ABNORMAL HIGH (ref 0.00–1.49)
Prothrombin Time: 21.7 seconds — ABNORMAL HIGH (ref 11.6–15.2)

## 2012-08-23 LAB — GLUCOSE, CAPILLARY: Glucose-Capillary: 170 mg/dL — ABNORMAL HIGH (ref 70–99)

## 2012-08-23 LAB — POCT I-STAT TROPONIN I: Troponin i, poc: 0.02 ng/mL (ref 0.00–0.08)

## 2012-08-23 LAB — TROPONIN I: Troponin I: <0.3 ng/mL (ref <0.30)

## 2012-08-23 MED ORDER — MIRTAZAPINE 30 MG PO TABS
30.0000 mg | ORAL_TABLET | Freq: Every day | ORAL | Status: DC
  Administered 2012-08-24: 30 mg via ORAL
  Filled 2012-08-23 (×3): qty 1

## 2012-08-23 MED ORDER — HYDROCODONE-ACETAMINOPHEN 5-325 MG PO TABS
2.0000 | ORAL_TABLET | Freq: Four times a day (QID) | ORAL | Status: DC | PRN
Start: 2012-08-23 — End: 2012-08-25
  Administered 2012-08-23 – 2012-08-24 (×3): 2 via ORAL
  Filled 2012-08-23 (×3): qty 2

## 2012-08-23 MED ORDER — ISOSORBIDE DINITRATE 20 MG PO TABS
20.0000 mg | ORAL_TABLET | Freq: Three times a day (TID) | ORAL | Status: DC
  Administered 2012-08-23 – 2012-08-25 (×6): 20 mg via ORAL
  Filled 2012-08-23 (×8): qty 1

## 2012-08-23 MED ORDER — ENOXAPARIN SODIUM 30 MG/0.3ML ~~LOC~~ SOLN: 30.0000 mg | SUBCUTANEOUS | Status: DC

## 2012-08-23 MED ORDER — POLYSACCHARIDE IRON COMPLEX 150 MG PO CAPS
150.0000 mg | ORAL_CAPSULE | Freq: Every day | ORAL | Status: DC
  Administered 2012-08-24 – 2012-08-25 (×2): 150 mg via ORAL
  Filled 2012-08-23 (×2): qty 1

## 2012-08-23 MED ORDER — WARFARIN - PHYSICIAN DOSING INPATIENT: Freq: Every day | Status: DC

## 2012-08-23 MED ORDER — DOCUSATE SODIUM 100 MG PO CAPS
100.0000 mg | ORAL_CAPSULE | Freq: Two times a day (BID) | ORAL | Status: DC | PRN
  Filled 2012-08-23: qty 1

## 2012-08-23 MED ORDER — WARFARIN SODIUM 5 MG PO TABS
5.0000 mg | ORAL_TABLET | Freq: Every day | ORAL | Status: DC
  Administered 2012-08-23 – 2012-08-24 (×2): 5 mg via ORAL
  Filled 2012-08-23 (×4): qty 1

## 2012-08-23 MED ORDER — SODIUM CHLORIDE 0.9 % IV SOLN
INTRAVENOUS | Status: AC
  Administered 2012-08-23: 16:00:00 via INTRAVENOUS

## 2012-08-23 MED ORDER — POLYETHYLENE GLYCOL 3350 17 G PO PACK
17.0000 g | PACK | Freq: Every day | ORAL | Status: DC
  Administered 2012-08-24 – 2012-08-25 (×2): 17 g via ORAL
  Filled 2012-08-23 (×2): qty 1

## 2012-08-23 MED ORDER — BIOTENE DRY MOUTH MT LIQD
15.0000 mL | Freq: Two times a day (BID) | OROMUCOSAL | Status: DC
  Administered 2012-08-23 – 2012-08-25 (×4): 15 mL via OROMUCOSAL

## 2012-08-23 MED ORDER — INSULIN GLARGINE 100 UNIT/ML ~~LOC~~ SOLN
5.0000 [IU] | Freq: Every day | SUBCUTANEOUS | Status: DC
  Administered 2012-08-23 – 2012-08-24 (×2): 5 [IU] via SUBCUTANEOUS

## 2012-08-23 MED ORDER — DILTIAZEM HCL 60 MG PO TABS
60.0000 mg | ORAL_TABLET | Freq: Four times a day (QID) | ORAL | Status: DC
  Administered 2012-08-23 – 2012-08-25 (×8): 60 mg via ORAL
  Filled 2012-08-23 (×10): qty 1

## 2012-08-23 MED ORDER — HYDRALAZINE HCL 50 MG PO TABS
50.0000 mg | ORAL_TABLET | Freq: Three times a day (TID) | ORAL | Status: DC
  Administered 2012-08-23 – 2012-08-25 (×6): 50 mg via ORAL
  Filled 2012-08-23 (×8): qty 1

## 2012-08-23 MED ORDER — INFLUENZA VIRUS VACC SPLIT PF IM SUSP
0.5000 mL | INTRAMUSCULAR | Status: AC
  Administered 2012-08-24: 0.5 mL via INTRAMUSCULAR
  Filled 2012-08-23: qty 0.5

## 2012-08-23 MED ORDER — ALLOPURINOL 150 MG HALF TABLET
150.0000 mg | ORAL_TABLET | Freq: Every day | ORAL | Status: DC
  Administered 2012-08-24 – 2012-08-25 (×2): 150 mg via ORAL
  Filled 2012-08-23 (×3): qty 1

## 2012-08-23 MED ORDER — GABAPENTIN 300 MG PO CAPS
300.0000 mg | ORAL_CAPSULE | Freq: Three times a day (TID) | ORAL | Status: DC
  Administered 2012-08-23 – 2012-08-25 (×6): 300 mg via ORAL
  Filled 2012-08-23 (×8): qty 1

## 2012-08-23 MED ORDER — CLONAZEPAM 0.5 MG PO TABS
0.5000 mg | ORAL_TABLET | Freq: Once | ORAL | Status: AC
  Administered 2012-08-23: 0.5 mg via ORAL
  Filled 2012-08-23: qty 1

## 2012-08-23 MED ORDER — INSULIN ASPART 100 UNIT/ML ~~LOC~~ SOLN
5.0000 [IU] | Freq: Three times a day (TID) | SUBCUTANEOUS | Status: DC
  Administered 2012-08-23 – 2012-08-25 (×6): 5 [IU] via SUBCUTANEOUS

## 2012-08-23 MED ORDER — TORSEMIDE 5 MG PO TABS
5.0000 mg | ORAL_TABLET | Freq: Every day | ORAL | Status: DC
  Administered 2012-08-24 – 2012-08-25 (×2): 5 mg via ORAL
  Filled 2012-08-23 (×2): qty 1

## 2012-08-23 MED ORDER — SODIUM CHLORIDE 0.9 % IV SOLN
Freq: Once | INTRAVENOUS | Status: AC
  Administered 2012-08-23: 250 mL via INTRAVENOUS

## 2012-08-23 MED ORDER — PANTOPRAZOLE SODIUM 40 MG PO TBEC
40.0000 mg | DELAYED_RELEASE_TABLET | Freq: Every day | ORAL | Status: DC
  Administered 2012-08-23 – 2012-08-25 (×3): 40 mg via ORAL
  Filled 2012-08-23 (×4): qty 1

## 2012-08-23 MED ORDER — SOTALOL HCL 80 MG PO TABS
80.0000 mg | ORAL_TABLET | Freq: Two times a day (BID) | ORAL | Status: DC
  Administered 2012-08-23 – 2012-08-25 (×4): 80 mg via ORAL
  Filled 2012-08-23 (×5): qty 1

## 2012-08-23 NOTE — ED Notes (Signed)
Per EMS, facility reported pt started having tremors and jerky movements. Pt denies pain but reports "wheezing". Pt reports tremors/jerking for about 3 days.

## 2012-08-23 NOTE — H&P (Signed)
[facility] History and Physical  Gina Ashley ZOX:[id number] DOB: Jun 16, 1927 DOA: 08/23/2012  Referring physician: ED physician PCP: Ernestine Conrad, MD   Chief Complaint: dysarthria  HPI:  PT is 78 yo female with medical problems outlined below who presents to [facility] ED with main concern of progressive tremors in all extremities, intermittent and with no specific aggravating or alleviating factors. Pt reports similar episodes in the past and reports history of TIA. She denies fevers, chills, abdominal or urinary concerns, no specific focal neurologic weakness, no headaches or visual changes.  Assessment and Plan:  Principal Problem:  *Dysarthria - unclear etiology at this time but now completely resolved - will proceed with stroke rule out protocol - pt is refusing MRI but will go ahead and perform vascula carotid doppler and 2 D ECHO - will also proceed with risk stratification, check fasting lipid panel and HgA1C - PT/OT/SLP evaluation  Active Problems:  Acute on chronic renal failure - likely of pre renal etiology and secondary to dehydration and poor oral intake - provide gentle hydration - BMP in AM   DM - will check A1C - continue home medication regimen, insulin   DEPRESSION - stable   HYPERTENSION - stable - will continue to monitor vitals per floor protocol   GERD - continue Protonix   Atrial fibrillation - irregular rate and rhythm on exam, continue diltiazem  - continue coumadin per pharmacy   Anemia due to chronic illness - Hg and Hct appear to be at pt's baseline - CBC in AM   Hyponatremia - likely of pre renal etiology - IVF - BMP in AM   Hyperkalemia - repeat BMP now   COPD (chronic obstructive pulmonary disease) - stable - continue to monitor vitals per floor protocol - oxygen as needed  Code Status: Full Family Communication: Pt at bedside Disposition Plan: PT evaluation    Review of Systems:  Constitutional: Negative for fever,  chills and malaise/fatigue. Negative for diaphoresis.  HENT: Negative for hearing loss, ear pain, nosebleeds, congestion, sore throat, neck pain, tinnitus and ear discharge.   Eyes: Negative for blurred vision, double vision, photophobia, pain, discharge and redness.  Respiratory: Negative for cough, hemoptysis, sputum production, shortness of breath, wheezing and stridor.   Cardiovascular: Negative for chest pain, palpitations, orthopnea, claudication and leg swelling.  Gastrointestinal: Negative for nausea, vomiting and abdominal pain. Negative for heartburn, constipation, blood in stool and melena.  Genitourinary: Negative for dysuria, urgency, frequency, hematuria and flank pain.  Musculoskeletal: Negative for myalgias, back pain, joint pain and falls.  Skin: Negative for itching and rash.  Neurological: Negative for dizziness and weakness. Negative for tingling, tremors, sensory change, speech change, focal weakness, loss of consciousness and headaches.  Endo/Heme/Allergies: Negative for environmental allergies and polydipsia. Does not bruise/bleed easily.  Psychiatric/Behavioral: Negative for suicidal ideas. The patient is not nervous/anxious.      Past Medical History  Diagnosis Date  . Diabetes mellitus     x 15 yrs  . GERD (gastroesophageal reflux disease)   . Back pain, chronic   . Pain in shoulder   . Pain in ankle   . Hiatal hernia   . HTN (hypertension)     all her life  . CKD (chronic kidney disease), stage III     GFR: 38  . Atrial fibrillation     since 1996  . Sleep apnea   . Fall 3 weeks ago    was [facility] at [facility]  . OSA on CPAP   .  Depression     Past Surgical History  Procedure Date  . Bunionectomy     bilateral  . Cholecystectomy   . Rotator cuff repair     2002  . Total knee arthroplasty     bilateral 2001  . Hammer toe surgery     2004  . Colonoscopy 07/11/2011    Procedure: COLONOSCOPY;  Surgeon: Malissa Hippo, MD;  Location: [facility];  Service: Endoscopy;  Laterality: N/A;  3:00    Social History:  reports that she has never smoked. She has never used smokeless tobacco. She reports that she does not drink alcohol or use illicit drugs.  Allergies  Allergen Reactions  . Morphine Sulfate Other (See Comments)    REACTION: change in personality  . Percocet (Oxycodone-Acetaminophen) Other (See Comments)    Does not relieve the pain  . Tuna (Fish Allergy)   . Penicillins Rash    Family History  Problem Relation Age of Onset  . Colon cancer Brother     Prior to Admission medications   Medication Sig Start Date End Date Taking? Authorizing Provider  allopurinol (ZYLOPRIM) 300 MG tablet Take 150 mg by mouth daily.   Yes Historical Provider, MD  diltiazem (CARDIZEM) 60 MG tablet Take 60 mg by mouth 4 (four) times daily.   Yes Historical Provider, MD  docusate sodium (COLACE) 100 MG capsule Take 100 mg by mouth 2 (two) times daily as needed. constipation   Yes Historical Provider, MD  gabapentin (NEURONTIN) 300 MG capsule Take 300 mg by mouth 3 (three) times daily.    Yes Historical Provider, MD  hydrALAZINE (APRESOLINE) 50 MG tablet Take 50 mg by mouth 3 (three) times daily.   Yes Historical Provider, MD  HYDROcodone-acetaminophen (NORCO/VICODIN) 5-325 MG per tablet Take 2 tablets by mouth every 6 (six) hours as needed. pain   Yes Historical Provider, MD  insulin aspart (NOVOLOG) 100 UNIT/ML injection Inject 5 Units into the skin 3 (three) times daily before meals. cbg>150   Yes Historical Provider, MD  insulin glargine (LANTUS) 100 UNIT/ML injection Inject 5 Units into the skin at bedtime.   Yes Historical Provider, MD  iron polysaccharides (NIFEREX) 150 MG capsule Take 150 mg by mouth daily.   Yes Historical Provider, MD  isosorbide dinitrate (ISORDIL) 20 MG tablet Take 20 mg by mouth 3 (three) times daily.   Yes Historical Provider, MD  mirtazapine (REMERON) 30 MG tablet Take 30 mg by mouth at bedtime.   Yes  Historical Provider, MD  pantoprazole (PROTONIX) 40 MG tablet Take 40 mg by mouth daily.   Yes Historical Provider, MD  polyethylene glycol (MIRALAX / GLYCOLAX) packet Take 17 g by mouth daily.   Yes Historical Provider, MD  sotalol (BETAPACE) 80 MG tablet Take 80 mg by mouth 2 (two) times daily.   Yes Historical Provider, MD  torsemide (DEMADEX) 5 MG tablet Take 5 mg by mouth daily.   Yes Historical Provider, MD  warfarin (COUMADIN) 5 MG tablet Take 5 mg by mouth daily.    Yes Historical Provider, MD    Physical Exam: Filed Vitals:   08/23/12 1138 08/23/12 1148  BP: 130/82   Pulse: 96   Temp: 97.8 F (36.6 C)   TempSrc: Oral   Resp: 22   Weight:  111.585 kg (246 lb)    Physical Exam  Constitutional: Appears well-developed and well-nourished. No distress.  HENT: Normocephalic. External right and left ear normal. Oropharynx is clear and moist.  Eyes: Conjunctivae  and EOM are normal. PERRLA, no scleral icterus.  Neck: Normal ROM. Neck supple. No JVD. No tracheal deviation. No thyromegaly.  CVS: IRRR, no murmurs, no gallops, no carotid bruit.  Pulmonary: Effort and breath sounds normal, no stridor, rhonchi, wheezes, rales.  Abdominal: Soft. BS +,  no distension, tenderness, rebound or guarding.  Musculoskeletal: Normal range of motion. No edema and no tenderness.  Lymphadenopathy: No lymphadenopathy noted, cervical, inguinal. Neuro: Alert. Normal reflexes, muscle tone coordination. No cranial nerve deficit. Skin: Skin is warm and dry. No rash noted. Not diaphoretic. No erythema. No pallor.  Psychiatric: Normal mood and affect. Behavior, judgment, thought content normal.   Labs on Admission:  Basic Metabolic Panel:  Lab 08/23/12 1914  NA 127*  K 5.3*  CL 87*  CO2 33*  GLUCOSE 164*  BUN 62*  CREATININE 2.30*  CALCIUM 9.5  MG --  PHOS --   Liver Function Tests:  Lab 08/23/12 1216  AST 21  ALT 19  ALKPHOS 136*  BILITOT 0.4  PROT 7.6  ALBUMIN 3.8   No results found  for this basename: LIPASE:5,AMYLASE:5 in the last 168 hours No results found for this basename: AMMONIA:5 in the last 168 hours CBC:  Lab 08/23/12 1216  WBC 7.2  NEUTROABS 5.7  HGB 9.8*  HCT 31.7*  MCV 83.4  PLT 220   Cardiac Enzymes:  Lab 08/23/12 1217  CKTOTAL --  CKMB --  CKMBINDEX --  TROPONINI <0.30   BNP: No components found with this basename: POCBNP:5 CBG:  Lab 08/23/12 1202  GLUCAP 170*    Radiological Exams on Admission: Ct Head Wo Contrast  08/23/2012  *RADIOLOGY REPORT*  Clinical Data: Tremors and shaking  CT HEAD WITHOUT CONTRAST  Technique:  Contiguous axial images were obtained from the base of the skull through the vertex without contrast.  Comparison: 06/27/2012  Findings: Stable for mild age white matter hypodensity. There is prominence of the sulci and ventricles consistent with brain atrophy.  There is no evidence for acute brain infarct, hemorrhage or mass.  The paranasal sinuses and mastoid air cells are clear.  The skull appears intact.  IMPRESSION:  1.  No acute intracranial abnormalities.   Original Report Authenticated By: Rosealee Albee, M.D.     EKG: Normal sinus rhythm, no ST/T wave changes  Debbora Presto, MD  [facility] Pager [phone]  If 7PM-7AM, please contact night-coverage [email or web address] Password [email or web address] 08/23/2012, 2:23 PM

## 2012-08-23 NOTE — Progress Notes (Signed)
VASCULAR LAB PRELIMINARY  PRELIMINARY  PRELIMINARY  PRELIMINARY  Carotid Dopplers completed.    Preliminary report:  There is no significant right ICA stenosis.  There is left 60-79% ICA stenosis, low end of scale.  Gina Ashley, 08/23/2012, 4:56 PM

## 2012-08-23 NOTE — Progress Notes (Signed)
PHARMACIST - PHYSICIAN COMMUNICATION CONCERNING: Pharmacy Care Issues Regarding Warfarin Labs  RECOMMENDATION (Action Taken): A baseline and daily protime for three days has been ordered to meet the [facility] Patient safety goal and comply with the current [facility] Pharmacy & Therapeutics Committee policy.   The Pharmacy will defer all warfarin dose order changes and follow up of lab results to the prescriber unless an additional order to initiate a "pharmacy Coumadin consult" is placed.  DESCRIPTION:  While hospitalized, to be in compliance with The [employer] Commission National Patient Safety Goals, all patients on warfarin must have a baseline and/or current protime prior to the administration of warfarin. Pharmacy has received your order for warfarin without these required laboratory assessments.   Geoffry Paradise, PharmD, BCPS Pager: [phone] 2:40 PM Pharmacy #: [phone]

## 2012-08-23 NOTE — ED Notes (Addendum)
Patient transported to CT 

## 2012-08-23 NOTE — ED Provider Notes (Signed)
History    85yF with intermittently jerking movements. Has been having for past several days. Says arms and leg just "twitch" seemingly randomly. Not painful, but disconcerting. No acute numbness or tingling. Pt says she has been having intermittent dysarthria as well. Three episodes in past week. Lasts around 15-20 minutes then resolves. No associated symptoms with this. No HA. No confusion per family at bedside. No acute visual changes. Denies hx of CVA. On warfarin for hx of afib.  CSN: [id number]  Arrival date & time 08/23/12  1136   First MD Initiated Contact with Patient 08/23/12 1142      Chief Complaint  Patient presents with  . Tremors    (Consider location/radiation/quality/duration/timing/severity/associated sxs/prior treatment) HPI  Past Medical History  Diagnosis Date  . Diabetes mellitus     x 15 yrs  . GERD (gastroesophageal reflux disease)   . Back pain, chronic   . Pain in shoulder   . Pain in ankle   . Hiatal hernia   . HTN (hypertension)     all her life  . CKD (chronic kidney disease), stage III     GFR: 38  . Atrial fibrillation     since 1996  . Sleep apnea   . Fall 3 weeks ago    was evaluated at [facility]  . OSA on CPAP   . Depression     Past Surgical History  Procedure Date  . Bunionectomy     bilateral  . Cholecystectomy   . Rotator cuff repair     2002  . Total knee arthroplasty     bilateral 2001  . Hammer toe surgery     2004  . Colonoscopy 07/11/2011    Procedure: COLONOSCOPY;  Surgeon: Malissa Hippo, MD;  Location: [facility];  Service: Endoscopy;  Laterality: N/A;  3:00    Family History  Problem Relation Age of Onset  . Colon cancer Brother     History  Substance Use Topics  . Smoking status: Never Smoker   . Smokeless tobacco: Never Used  . Alcohol Use: No    OB History    Grav Para Term Preterm Abortions TAB SAB Ect Mult Living                  Review of Systems   Review of symptoms negative unless  otherwise noted in HPI.   Allergies  Morphine sulfate; Percocet; Tuna; and Penicillins  Home Medications   Current Outpatient Rx  Name Route Sig Dispense Refill  . IPRATROPIUM-ALBUTEROL 18-103 MCG/ACT IN AERO Inhalation Inhale 2 puffs into the lungs 4 (four) times daily. 14.7 g 1  . AMLODIPINE BESYLATE 10 MG PO TABS Oral Take 10 mg by mouth daily.      . ASPIRIN 81 MG PO TBEC Oral Take 1 tablet (81 mg total) by mouth daily. 90 tablet 1  . CHOLESTYRAMINE LIGHT 4 G PO PACK Oral Take 4 g by mouth 3 (three) times daily as needed. For bowels    . CLOTRIMAZOLE-BETAMETHASONE 1-0.05 % EX CREA Topical Apply 1 application topically 2 (two) times daily as needed. For rash    . ERGOCALCIFEROL 50000 UNITS PO CAPS Oral Take 50,000 Units by mouth 2 (two) times a week. On Sunday and Thursday    . FUROSEMIDE 20 MG PO TABS Oral Take 1 tablet (20 mg total) by mouth every other day. 30 tablet 0  . GABAPENTIN 300 MG PO CAPS Oral Take 300 mg by mouth 2 (two)  times daily.     Marland Kitchen HYDRALAZINE HCL 50 MG PO TABS Oral Take 50 mg by mouth 3 (three) times daily.    Marland Kitchen HYDROCODONE-ACETAMINOPHEN 10-500 MG PO TABS Oral Take 1 tablet by mouth every 4 (four) hours as needed. For pain    . POLYSACCHARIDE IRON COMPLEX 150 MG PO CAPS Oral Take 150 mg by mouth daily.    Marland Kitchen IRON-VITAMIN C 65-125 MG PO TABS Oral Take 1 tablet by mouth 2 (two) times daily.    . ISOSORBIDE DINITRATE 20 MG PO TABS Oral Take 20 mg by mouth 3 (three) times daily.    Marland Kitchen LISINOPRIL 40 MG PO TABS Oral Take 10-20 mg by mouth 2 (two) times daily. Take 1 tablet (20mg ) in the morning and one-half tablet (10mg ) in the evening    . MECLIZINE HCL 50 MG PO TABS Oral Take 12.5-25 mg by mouth 3 (three) times daily as needed. For dizziness    . OLOPATADINE HCL 0.2 % OP SOLN Ophthalmic Apply 1 drop to eye daily.    Marland Kitchen OMEPRAZOLE 40 MG PO CPDR Oral Take 40 mg by mouth daily.      Marland Kitchen POTASSIUM CHLORIDE ER 10 MEQ PO TBCR Oral Take 10 mEq by mouth 2 (two) times daily.      Marland Kitchen  SITAGLIPTIN PHOSPHATE 50 MG PO TABS Oral Take 50 mg by mouth daily.    Marland Kitchen SOTALOL HCL 120 MG PO TABS Oral Take 40 mg by mouth 2 (two) times daily.     . TRAZODONE HCL 50 MG PO TABS Oral Take 100 mg by mouth at bedtime as needed. For sleep    . VITAMIN B-12 1000 MCG PO TABS Oral Take 1,000 mcg by mouth 2 (two) times daily.    . WARFARIN SODIUM 5 MG PO TABS Oral Take 5-10 mg by mouth daily. Take 5mg  (1 tablet) on Sunday, Tuesday, and Thursday. On Monday, Wednesday, Friday, and Saturday, take 7.5mg  (1 1/2 tablets).      BP 130/82  Pulse 96  Temp 97.8 F (36.6 C) (Oral)  Resp 22  Physical Exam  Nursing note and vitals reviewed. Constitutional: She appears well-developed and well-nourished. No distress.       Laying in bed. No acute distress. Obese.  HENT:  Head: Normocephalic and atraumatic.  Eyes: Conjunctivae normal are normal. Right eye exhibits no discharge. Left eye exhibits no discharge.  Neck: Neck supple.  Cardiovascular: Regular rhythm and normal heart sounds.  Exam reveals no gallop and no friction rub.   No murmur heard.      Irregularly irregular rhythm. Not tachycardic. No murmurs appreciated.  Pulmonary/Chest: Effort normal and breath sounds normal. No respiratory distress.       Breath sounds decreased bilaterally.  Abdominal: Soft. She exhibits no distension. There is no tenderness.  Musculoskeletal: She exhibits no edema and no tenderness.  Neurological: She is alert.       Intermittent myoclonic jerking most noticeable in the extremities. Also noticed in the face as well. Strength is 5 out of 5 bilateral uppe/lower extremities. Cranial nerves II through XII are intact. Visual fields are intact to confrontation. Speech is clear and content is appropriate. Patient is able to correctly identify objects around the room. Good finger to nose testing bilaterally. No pronator drift.  Skin: Skin is warm and dry.  Psychiatric: She has a normal mood and affect. Her behavior is  normal. Thought content normal.    ED Course  Procedures (including critical care time)  Labs Reviewed  GLUCOSE, CAPILLARY - Abnormal; Notable for the following:    Glucose-Capillary 170 (*)     All other components within normal limits  PROTIME-INR - Abnormal; Notable for the following:    Prothrombin Time 21.7 (*)     INR 1.98 (*)     All other components within normal limits  APTT - Abnormal; Notable for the following:    aPTT 39 (*)     All other components within normal limits  CBC - Abnormal; Notable for the following:    RBC 3.80 (*)     Hemoglobin 9.8 (*)     HCT 31.7 (*)     MCH 25.8 (*)     RDW 16.7 (*)     All other components within normal limits  DIFFERENTIAL - Abnormal; Notable for the following:    Neutrophils Relative 79 (*)     All other components within normal limits  COMPREHENSIVE METABOLIC PANEL - Abnormal; Notable for the following:    Sodium 127 (*)     Potassium 5.3 (*)     Chloride 87 (*)     CO2 33 (*)     Glucose, Bld 164 (*)     BUN 62 (*)     Creatinine, Ser 2.30 (*)     Alkaline Phosphatase 136 (*)     GFR calc non Af Amer 19 (*)     GFR calc Af Amer 21 (*)     All other components within normal limits  URINALYSIS, ROUTINE W REFLEX MICROSCOPIC - Abnormal; Notable for the following:    Leukocytes, UA TRACE (*)     All other components within normal limits  URINE MICROSCOPIC-ADD ON - Abnormal; Notable for the following:    Bacteria, UA FEW (*)     All other components within normal limits  HEMOGLOBIN A1C - Abnormal; Notable for the following:    Hemoglobin A1C 6.8 (*)     Mean Plasma Glucose 148 (*)     All other components within normal limits  URINE RAPID DRUG SCREEN (HOSP PERFORMED) - Abnormal; Notable for the following:    Opiates POSITIVE (*)     All other components within normal limits  BASIC METABOLIC PANEL - Abnormal; Notable for the following:    Sodium 130 (*)     Potassium 5.3 (*)     Chloride 90 (*)     CO2 34 (*)      Glucose, Bld 185 (*)     BUN 60 (*)     Creatinine, Ser 2.18 (*)     GFR calc non Af Amer 20 (*)     GFR calc Af Amer 23 (*)     All other components within normal limits  CBC - Abnormal; Notable for the following:    RBC 3.51 (*)     Hemoglobin 9.2 (*)     HCT 29.5 (*)     RDW 16.8 (*)     All other components within normal limits  BASIC METABOLIC PANEL - Abnormal; Notable for the following:    Sodium 130 (*)     Potassium 5.3 (*)     Chloride 91 (*)     CO2 34 (*)     Glucose, Bld 170 (*)     BUN 61 (*)     Creatinine, Ser 2.18 (*)     GFR calc non Af Amer 20 (*)     GFR calc Af Amer 23 (*)  All other components within normal limits  PROTIME-INR - Abnormal; Notable for the following:    Prothrombin Time 22.1 (*)     INR 2.03 (*)     All other components within normal limits  GLUCOSE, CAPILLARY - Abnormal; Notable for the following:    Glucose-Capillary 162 (*)     All other components within normal limits  GLUCOSE, CAPILLARY - Abnormal; Notable for the following:    Glucose-Capillary 200 (*)     All other components within normal limits  GLUCOSE, CAPILLARY - Abnormal; Notable for the following:    Glucose-Capillary 177 (*)     All other components within normal limits  GLUCOSE, CAPILLARY - Abnormal; Notable for the following:    Glucose-Capillary 163 (*)     All other components within normal limits  BASIC METABOLIC PANEL - Abnormal; Notable for the following:    Sodium 133 (*)     Chloride 92 (*)     CO2 37 (*)     Glucose, Bld 174 (*)     BUN 59 (*)     Creatinine, Ser 2.04 (*)     GFR calc non Af Amer 21 (*)     GFR calc Af Amer 25 (*)     All other components within normal limits  PROTIME-INR - Abnormal; Notable for the following:    Prothrombin Time 23.1 (*)     INR 2.15 (*)     All other components within normal limits  CBC - Abnormal; Notable for the following:    RBC 3.78 (*)     Hemoglobin 9.8 (*)     HCT 32.2 (*)     MCH 25.9 (*)     RDW 16.8  (*)     All other components within normal limits  BASIC METABOLIC PANEL - Abnormal; Notable for the following:    Chloride 93 (*)     CO2 37 (*)     Glucose, Bld 158 (*)     BUN 50 (*)     Creatinine, Ser 1.79 (*)     GFR calc non Af Amer 25 (*)     GFR calc Af Amer 29 (*)     All other components within normal limits  GLUCOSE, CAPILLARY - Abnormal; Notable for the following:    Glucose-Capillary 151 (*)     All other components within normal limits  GLUCOSE, CAPILLARY - Abnormal; Notable for the following:    Glucose-Capillary 208 (*)     All other components within normal limits  GLUCOSE, CAPILLARY - Abnormal; Notable for the following:    Glucose-Capillary 146 (*)     All other components within normal limits  GLUCOSE, CAPILLARY - Abnormal; Notable for the following:    Glucose-Capillary 148 (*)     All other components within normal limits  TROPONIN I  POCT I-STAT TROPONIN I  LIPID PANEL  MRSA PCR SCREENING  LAB REPORT - SCANNED   Dg Chest 1 View  08/27/2012  *RADIOLOGY REPORT*  Clinical Data: Post fall, history of atrial fibrillation  CHEST - 1 VIEW  Comparison: 07/26/2012; 07/25/2012; 07/18/2012  Findings:  Grossly unchanged enlarged cardiac silhouette and mediastinal contours.  Interval development of a moderate-sized right-sided pleural effusion.  Pulmonary vasculature remains indistinct with cephalization of flow.  No definite left-sided pleural effusion. No definite pneumothorax.  Calcification overlying the left lower neck may be within the left lobe of the thyroid.  No definite acute osseous abnormalities.  IMPRESSION: 1.  Interval development  of a moderate-sized right-sided pleural effusion with persistent findings of mild pulmonary edema.  Further evaluation with a PA and lateral chest radiograph may be obtained as clinically indicated. 2.  Possible calcification within the left lobe of the thyroid. Further evaluation with thyroid ultrasound may be performed in the non  emergent setting as clinically indicated   Original Report Authenticated By: Waynard Reeds, M.D.    Dg Femur Right  08/27/2012  *RADIOLOGY REPORT*  Clinical Data: Post fall, now with severe pain and tenderness involving the distal aspect of the femur  RIGHT FEMUR - 2 VIEW  Comparison: None.  Findings:  There is an oblique, minimally displaced, slightly impacted fracture of the distal femoral metaphysis.  There is no definitive extension of the fracture into the right total knee replacement hardware.  No definite evidence of hardware failure or loosening. No definite suprapatellar joint effusion, though evaluation degraded due to technique.  No radiopaque foreign body.  IMPRESSION: Oblique, minimally displaced, slightly impacted fracture of the distal femoral metaphysis without definite extension to involve the femoral component of the right total knee replacement.   Original Report Authenticated By: Waynard Reeds, M.D.    Dg Tibia/fibula Left  08/27/2012  *RADIOLOGY REPORT*  Clinical Data: Post fall, now with leg pain  LEFT TIBIA AND FIBULA - 2 VIEW  Comparison: None.  Findings: There is an oblique, displaced fracture of the proximal tibial metaphysis with apparent extension to involve the tibial component of the left total knee hardware, which appears minimally angulated, apex ventral.  This is the adjacent soft tissue swelling.  No radiopaque foreign body.  Vascular calcifications.  IMPRESSION: Oblique, displaced fracture of the proximal tibial metaphysis with extension to involve the tibial component of the left total knee replacement hardware.   Original Report Authenticated By: Waynard Reeds, M.D.    EKG:  Rhythm: atrial fibrillation Vent. rate 100 BPM PR interval 111 ms QRS duration 92 ms QT/QTc 392/506 ms Axis: normal ST segments: NS ST changes   1. TIA (transient ischemic attack)   2. Hyponatremia   3. Acute kidney injury   4. Atrial fibrillation   5. Acute on chronic renal  failure   6. Allergic rhinitis, cause unspecified   7. DM   8. DEPRESSION   9. HYPERTENSION   10. GERD   11. COPD (chronic obstructive pulmonary disease)   12. Anemia due to chronic illness   13. Dysarthria   14. Hyperkalemia   15. PERSISTENT DISORDER INITIATING/MAINTAINING SLEEP   16. OBSTRUCTIVE SLEEP APNEA   17. ALLERGIC RHINITIS   18. ATRIAL FIBRILLATION, HX OF   19. HTN (hypertension)   20. Pulmonary hypertension   21. Fatigue   22. Hypovolemia dehydration   23. Chronic hypoxemic respiratory failure       MDM  85yF presenting with intermittent myoclonic jerking.  I'm more concerned of intermittent dysarthria she is having. Currently resolved on my exam, but concerning for possible TIA. CT head did not show any acute abnormalities. Pt with multiple risk factors for stroke. Admit for further eval.        Raeford Razor, MD 08/28/12 1051

## 2012-08-23 NOTE — ED Notes (Signed)
UVO:[id number]<YQ> Expected date:08/23/12<BR> Expected time:11:35 AM<BR> Means of arrival:Ambulance<BR> Comments:<BR> Shaking from nursing home

## 2012-08-24 LAB — BASIC METABOLIC PANEL
CO2: 34 mEq/L — ABNORMAL HIGH (ref 19–32)
CO2: 37 mEq/L — ABNORMAL HIGH (ref 19–32)
Chloride: 91 mEq/L — ABNORMAL LOW (ref 96–112)
GFR calc non Af Amer: 21 mL/min — ABNORMAL LOW (ref >90)
Glucose, Bld: 174 mg/dL — ABNORMAL HIGH (ref 70–99)
Potassium: 5.3 mEq/L — ABNORMAL HIGH (ref 3.5–5.1)
Potassium: 4.8 mEq/L (ref 3.5–5.1)
Sodium: 130 mEq/L — ABNORMAL LOW (ref 135–145)
Sodium: 133 mEq/L — ABNORMAL LOW (ref 135–145)

## 2012-08-24 LAB — GLUCOSE, CAPILLARY: Glucose-Capillary: 151 mg/dL — ABNORMAL HIGH (ref 70–99)

## 2012-08-24 LAB — CBC
Platelets: 190 10*3/uL (ref 150–400)
RBC: 3.51 MIL/uL — ABNORMAL LOW (ref 3.87–5.11)
WBC: 6.9 10*3/uL (ref 4.0–10.5)

## 2012-08-24 LAB — PROTIME-INR
INR: 2.03 — ABNORMAL HIGH (ref 0.00–1.49)
Prothrombin Time: 22.1 seconds — ABNORMAL HIGH (ref 11.6–15.2)

## 2012-08-24 LAB — LIPID PANEL
Cholesterol: 96 mg/dL (ref 0–200)
HDL: 43 mg/dL (ref >39)
Total CHOL/HDL Ratio: 2.2 RATIO
Triglycerides: 74 mg/dL (ref <150)

## 2012-08-24 MED ORDER — SODIUM POLYSTYRENE SULFONATE 15 GM/60ML PO SUSP
30.0000 g | Freq: Once | ORAL | Status: AC
  Administered 2012-08-24: 30 g via ORAL
  Filled 2012-08-24: qty 120

## 2012-08-24 NOTE — Progress Notes (Signed)
Clinical Social Work Department BRIEF PSYCHOSOCIAL ASSESSMENT 08/24/2012  Patient:  Gina Ashley, Gina Ashley     Account Number:  [id number]     Admit date:  08/23/2012  Clinical Social Worker:  Orpah Greek  Date/Time:  08/24/2012 03:17 PM  Referred by:  Physician  Date Referred:  08/24/2012 Referred for  Other - See comment   Other Referral:   Admitted from: [facility]   Interview type:  Family Other interview type:    PSYCHOSOCIAL DATA Living Status:  FACILITY Admitted from facility:  [facility] Level of care:  Skilled Nursing Facility Primary support name:  Gina Ashley (daughter) h#: [phone] c#: [phone] Primary support relationship to patient:  CHILD, ADULT Degree of support available:   good    CURRENT CONCERNS Current Concerns  Post-Acute Placement   Other Concerns:    SOCIAL WORK ASSESSMENT / PLAN CSW spoke with patient & daughter, Gina Ashley at bedside re: discharge planning. Patient had been at [facility] for short-term rehab, where she plans to return at discharge.   Assessment/plan status:  Information/Referral to [facility] Other assessment/ plan:   Information/referral to community resources:   CSW completed FL2 and faxed information to [facility], confirmed with Jasmine December that patient is ok to return at discharge.    PATIENT'S/FAMILY'S RESPONSE TO PLAN OF CARE: Patient & daughter states they have been pleased with [facility] and plan to return there at discharge to complete her rehab stay.        Unice Bailey, LCSW [facility] Clinical Social Worker cell #: [phone]

## 2012-08-24 NOTE — Progress Notes (Signed)
   CARE MANAGEMENT NOTE 08/24/2012  Patient:  [facility]   Account Number:  [id number]  Date Initiated:  08/24/2012  Documentation initiated by:  Jiles Crocker  Subjective/Objective Assessment:   ADMITTED WITH RULE OUT STROKE     Action/Plan:   PCP: Ernestine Conrad, MD  PATIENT RESIDES IN A NURSING FACILITY; SOC WORKER REFERRAL PLACED   Anticipated DC Date:  08/31/2012   Anticipated DC Plan:  SKILLED NURSING FACILITY  In-house referral  Clinical Social Worker      DC Planning Services  CM consult          Status of service:  In process, will continue to follow Medicare Important Message given?  NA - LOS <3 / Initial given by admissions (If response is "NO", the following Medicare IM given date fields will be blank)  Per UR Regulation:  Reviewed for med. necessity/level of care/duration of stay  Comments:  08/24/2012- B Maily Debarge RN, BSN, MHA

## 2012-08-24 NOTE — Progress Notes (Signed)
Patient ID: Gina Ashley, female   DOB: [birth year], 78 y.o.   MRN: [id number]  [facility] PROGRESS NOTE  Gina Ashley JXB:[id number] DOB: 1927-05-17 DOA: 08/23/2012 PCP: Ernestine Conrad, MD  Brief narrative: PT is 78 yo female with medical problems outlined below who presents to [facility] ED with main concern of progressive tremors in all extremities, intermittent and with no specific aggravating or alleviating factors. Pt reports similar episodes in the past and reports history of TIA. She denies fevers, chills, abdominal or urinary concerns, no specific focal neurologic weakness, no headaches or visual changes.   Assessment and Plan:  Principal Problem:  *Dysarthria  - unclear etiology at this time but now completely resolved  - preliminary carotid dopplers indicated left ICA stenosis of 60%, no stenosis on the right side - 2D ECHO final results are pending - pt is refusing MRI but will go ahead and perform vascula carotid doppler and 2 D ECHO  - PT/OT/SLP evaluation  Active Problems:  Acute on chronic renal failure  - likely of pre renal etiology and secondary to dehydration and poor oral intake - creatinine is slightly trending down  - provide gentle hydration  - BMP in AM  DM  - A1C pending - continue home medication regimen, insulin  DEPRESSION  - stable  HYPERTENSION  - stable  - will continue to monitor vitals per floor protocol  GERD  - continue Protonix  Atrial fibrillation  - irregular rate and rhythm on exam, continue diltiazem  - continue coumadin per pharmacy  Anemia due to chronic illness  - Hg and Hct appear to be at pt's baseline  - CBC in AM  Hyponatremia  - likely of pre renal etiology  - IVF  - BMP in AM  Hyperkalemia  - give one dose of Kayexalate  - repeat BMP at noon - BMP in AM COPD (chronic obstructive pulmonary disease)  - stable  - continue to monitor vitals per floor protocol  - oxygen as needed    Consultants:  None  Procedures/Studies: Ct Head Wo Contrast 08/23/2012    IMPRESSION:   1.  No acute intracranial abnormalities.  Antibiotics:  None  Code Status: Full Family Communication: Pt at bedside Disposition Plan: PT evaluation   HPI/Subjective: No events overnight.   Objective: Filed Vitals:   08/24/12 0500 08/24/12 0537 08/24/12 1030 08/24/12 1417  BP:  131/86 138/88 128/81  Pulse:  103 106 110  Temp:  97.7 F (36.5 C) 97.3 F (36.3 C) 98 F (36.7 C)  TempSrc:  Oral Oral Oral  Resp:  22 20 20   Height:      Weight: 109.8 kg (242 lb 1 oz)     SpO2:  99% 98% 99%    Intake/Output Summary (Last 24 hours) at 08/24/12 1546 Last data filed at 08/24/12 1300  Gross per 24 hour  Intake    480 ml  Output    800 ml  Net   -320 ml    Exam:   General:  Pt is somnolent this AM, opens eyes slightly when called by name not able to follows commands appropriately, not in acute distress  Cardiovascular: Regular rhythm, tachycardic, S1/S2, no murmurs, no rubs, no gallops  Respiratory: Clear to auscultation bilaterally, decreased breath sounds at bases  Abdomen: Soft, non tender, non distended, bowel sounds present, no guarding  Extremities: No edema, pulses DP and PT palpable bilaterally  Neuro: Grossly nonfocal but rather somnolent, will have to repeat neuro exam once pt more  alert, she is moving all 4 extremities against gravity   Data Reviewed: Basic Metabolic Panel:  Lab 08/24/12 1610 08/23/12 1504 08/23/12 1216  NA 130* 130* 127*  K 5.3* 5.3* 5.3*  CL 91* 90* 87*  CO2 34* 34* 33*  GLUCOSE 170* 185* 164*  BUN 61* 60* 62*  CREATININE 2.18* 2.18* 2.30*  CALCIUM 9.1 9.2 9.5  MG -- -- --  PHOS -- -- --   Liver Function Tests:  Lab 08/23/12 1216  AST 21  ALT 19  ALKPHOS 136*  BILITOT 0.4  PROT 7.6  ALBUMIN 3.8   CBC:  Lab 08/24/12 0415 08/23/12 1216  WBC 6.9 7.2  NEUTROABS -- 5.7  HGB 9.2* 9.8*  HCT 29.5* 31.7*  MCV 84.0 83.4  PLT  190 220   Cardiac Enzymes:  Lab 08/23/12 1217  CKTOTAL --  CKMB --  CKMBINDEX --  TROPONINI <0.30   CBG:  Lab 08/24/12 1156 08/24/12 0743 08/23/12 2113 08/23/12 1707 08/23/12 1202  GLUCAP 163* 177* 200* 162* 170*    Recent Results (from the past 240 hour(s))  MRSA PCR SCREENING     Status: Normal   Collection Time   08/23/12  6:01 PM      Component Value Range Status Comment   MRSA by PCR NEGATIVE  NEGATIVE Final      Scheduled Meds:   . allopurinol  150 mg Oral Daily  . diltiazem  60 mg Oral QID  . gabapentin  300 mg Oral TID  . hydrALAZINE  50 mg Oral TID  . insulin aspart  5 Units Subcutaneous TID AC  . insulin glargine  5 Units Subcutaneous QHS  . iron polysaccharides  150 mg Oral Daily  . isosorbide dinitrate  20 mg Oral TID  . mirtazapine  30 mg Oral QHS  . pantoprazole  40 mg Oral Daily  . polyethylene glycol  17 g Oral Daily  . sodium polystyrene  30 g Oral Once  . sotalol  80 mg Oral BID  . torsemide  5 mg Oral Daily  . warfarin  5 mg Oral q1800   Continuous Infusions:    Debbora Presto, MD  [facility] Pager [phone]  If 7PM-7AM, please contact night-coverage [email or web address] Password [email or web address] 08/24/2012, 3:46 PM   LOS: 1 day

## 2012-08-24 NOTE — Progress Notes (Signed)
INITIAL ADULT NUTRITION ASSESSMENT Date: 08/24/2012   Time: 2:50 PM Reason for Assessment: Nutrition Risk  ASSESSMENT: Female 78 y.o.  Dx: Dysarthria, acute on chronic renal failure  Hx:  Past Medical History  Diagnosis Date  . Diabetes mellitus     x 15 yrs  . GERD (gastroesophageal reflux disease)   . Back pain, chronic   . Pain in shoulder   . Pain in ankle   . Hiatal hernia   . HTN (hypertension)     all her life  . CKD (chronic kidney disease), stage III     GFR: 38  . Atrial fibrillation     since 1996  . Sleep apnea   . Fall 3 weeks ago    was evaluated at [facility]  . OSA on CPAP   . Depression    Past Surgical History  Procedure Date  . Bunionectomy     bilateral  . Cholecystectomy   . Rotator cuff repair     2002  . Total knee arthroplasty     bilateral 2001  . Hammer toe surgery     2004  . Colonoscopy 07/11/2011    Procedure: COLONOSCOPY;  Surgeon: Malissa Hippo, MD;  Location: [facility];  Service: Endoscopy;  Laterality: N/A;  3:00    Related Meds:     . sodium chloride   Intravenous STAT  . allopurinol  150 mg Oral Daily  . antiseptic oral rinse  15 mL Mouth Rinse BID  . diltiazem  60 mg Oral QID  . gabapentin  300 mg Oral TID  . hydrALAZINE  50 mg Oral TID  . influenza  inactive virus vaccine  0.5 mL Intramuscular Tomorrow-1000  . insulin aspart  5 Units Subcutaneous TID AC  . insulin glargine  5 Units Subcutaneous QHS  . iron polysaccharides  150 mg Oral Daily  . isosorbide dinitrate  20 mg Oral TID  . mirtazapine  30 mg Oral QHS  . pantoprazole  40 mg Oral Daily  . polyethylene glycol  17 g Oral Daily  . sodium polystyrene  30 g Oral Once  . sotalol  80 mg Oral BID  . torsemide  5 mg Oral Daily  . warfarin  5 mg Oral q1800  . Warfarin - Physician Dosing Inpatient   Does not apply q1800     Ht: 5\' 3"  (160 cm)  Wt: 242 lb 1 oz (109.8 kg)  Ideal Wt: 52.4 kg  % Ideal Wt: 210  Usual Wt: 217# per daughter 2 months ago Wt  Readings from Last 10 Encounters:  08/24/12 242 lb 1 oz (109.8 kg)  06/16/12 221 lb 5.5 oz (100.4 kg)  07/11/11 208 lb (94.348 kg)  07/11/11 208 lb (94.348 kg)  06/14/11 212 lb 12.8 oz (96.525 kg)  06/03/11 208 lb 3.2 oz (94.439 kg)  04/17/11 215 lb (97.523 kg)  09/20/08 225 lb (102.059 kg)  08/19/08 226 lb 6.1 oz (102.686 kg)   % Usual Wt: 115  Body mass index is 42.88 kg/(m^2).-extreme obesisty  Labs:  CMP     Component Value Date/Time   NA 130* 08/24/2012 0415   K 5.3* 08/24/2012 0415   CL 91* 08/24/2012 0415   CO2 34* 08/24/2012 0415   GLUCOSE 170* 08/24/2012 0415   BUN 61* 08/24/2012 0415   CREATININE 2.18* 08/24/2012 0415   CALCIUM 9.1 08/24/2012 0415   PROT 7.6 08/23/2012 1216   ALBUMIN 3.8 08/23/2012 1216   AST 21 08/23/2012 1216  ALT 19 08/23/2012 1216   ALKPHOS 136* 08/23/2012 1216   BILITOT 0.4 08/23/2012 1216   GFRNONAA 20* 08/24/2012 0415   GFRAA 23* 08/24/2012 0415    Lab Results  Component Value Date   HGBA1C 6.8* 08/23/2012   HGBA1C 6.7* 06/14/2012   Lab Results  Component Value Date   LDLCALC 38 08/24/2012   CREATININE 2.18* 08/24/2012   I/O last 3 completed shifts: In: -  Out: 500 [Urine:500] Total I/O In: 480 [P.O.:480] Out: 300 [Urine:300]   Diet Order: Carb Control  Supplements/Tube Feeding:  IVF:    Estimated Nutritional Needs:   Kcal: 1700-1800 Protein: 80-90 gm Fluid: 1.7L  Food/Nutrition Related Hx: Pt reports decreased appetite and intake for the past 3 months.  Had been at [facility] and [facility] and did not always like the food.  Also, disliked low sodium diet.  Weight is currently up probably secondary to fluid.    NUTRITION DIAGNOSIS: -Inadequate oral intake (NI-2.1).  Status: Ongoing  RELATED TO: decreased appetite  AS EVIDENCE BY: pt report  MONITORING/EVALUATION(Goals): Intake, weight, labs Intake of >75 % meals  EDUCATION NEEDS: -No education needs identified at this  time  INTERVENTION: Encourage po Awaits NHP  Dietitian #:[id number]  DOCUMENTATION CODES Per approved criteria  -Obesity extreme    Jobe, Anastasia Fiedler 08/24/2012, 2:50 PM

## 2012-08-25 LAB — CBC
HCT: 32.2 % — ABNORMAL LOW (ref 36.0–46.0)
Platelets: 187 10*3/uL (ref 150–400)
RDW: 16.8 % — ABNORMAL HIGH (ref 11.5–15.5)
WBC: 6.3 10*3/uL (ref 4.0–10.5)

## 2012-08-25 LAB — BASIC METABOLIC PANEL
BUN: 50 mg/dL — ABNORMAL HIGH (ref 6–23)
Chloride: 93 mEq/L — ABNORMAL LOW (ref 96–112)
GFR calc Af Amer: 29 mL/min — ABNORMAL LOW (ref >90)
Potassium: 4.8 mEq/L (ref 3.5–5.1)
Sodium: 135 mEq/L (ref 135–145)

## 2012-08-25 LAB — GLUCOSE, CAPILLARY
Glucose-Capillary: 146 mg/dL — ABNORMAL HIGH (ref 70–99)
Glucose-Capillary: 148 mg/dL — ABNORMAL HIGH (ref 70–99)

## 2012-08-25 LAB — PROTIME-INR: INR: 2.15 — ABNORMAL HIGH (ref 0.00–1.49)

## 2012-08-25 NOTE — Progress Notes (Signed)
Clinical Social Worker received notification from RN that per MD pt stable to return to [facility] today. Clinical Social Worker facilitated pt discharge needs including contacting facility, faxing pt discharge information via [facility], discussing with pt and pt daughter, [name], providing contact number to RN to call report, and arranging ambulance transportation for pt back to [facility]. No further social work needs identified at this time. Clinical Social Worker signing off.   Jacklynn Lewis, MSW, LCSWA (coverage for [employer]) Clinical Social Work [phone]

## 2012-08-25 NOTE — Discharge Summary (Signed)
Physician Discharge Summary  Gina Ashley ZOX:[id number] DOB: Mar 21, 1927 DOA: 08/23/2012  PCP: Ernestine Conrad, MD  Admit date: 08/23/2012 Discharge date: 08/25/2012  Recommendations for Outpatient Follow-up:  1. Pt will need to follow up with PCP in 2-3 weeks post discharge 2. Please obtain BMP to evaluate electrolytes and kidney function 3. Please also check CBC to evaluate Hg and Hct levels 4. Pt will be discharged back to SNF [facility]   Discharge Diagnoses: Dysarthria secondary to TIA Principal Problem:  *Dysarthria Active Problems:  Acute on chronic renal failure  DM  DEPRESSION  HYPERTENSION  GERD  Atrial fibrillation  Anemia due to chronic illness  Hyponatremia  Hyperkalemia  COPD (chronic obstructive pulmonary disease)  Discharge Condition: Stable  Diet recommendation: Heart healthy diet discussed in details   Brief narrative:  PT is 78 yo female with medical problems outlined below who presents to [facility] ED with main concern of progressive tremors in all extremities, intermittent and with no specific aggravating or alleviating factors. Pt reports similar episodes in the past and reports history of TIA. She denies fevers, chills, abdominal or urinary concerns, no specific focal neurologic weakness, no headaches or visual changes.   Assessment and Plan:  Principal Problem:  *Dysarthria  - unclear etiology at this time but now completely resolved  - preliminary carotid dopplers indicated left ICA stenosis of 60%, no stenosis on the right side, per neurology recommendation no acute intervention indicated  - 2D ECHO final results indicate normal systolic function 55% but unable to adequately assess diastolic function  - pt is refusing MRI  - PT/OT/SLP evaluation recommended continued therapy at SNF Active Problems:  Acute on chronic renal failure  - likely of pre renal etiology and secondary to dehydration and poor oral intake  - creatinine is trending down  DM  -  A1C stable - continue home medication regimen, insulin  DEPRESSION  - stable  HYPERTENSION  - stable  GERD  - continue Protonix  Atrial fibrillation  - irregular rate and rhythm on exam, continue diltiazem  - continue coumadin per pharmacy  Anemia due to chronic illness  - Hg and Hct appear to be at pt's baseline  Hyponatremia  - likely of pre renal etiology and this AM within normal limits Hyperkalemia  - gave one dose of Kayexalate  - within normal limits this AM COPD (chronic obstructive pulmonary disease)  - stable  - continued to monitor vitals per floor protocol  - oxygen as needed provided - pt has maintained oxygen saturations > 95%  Consultants:  None  Procedures/Studies:   Ct Head Wo Contrast  08/23/2012  IMPRESSION:  1. No acute intracranial abnormalities.   Antibiotics:  None  Code Status: Full  Family Communication: Pt at bedside  Disposition Plan: Back to SNF [facility]   Discharge Exam: Filed Vitals:   08/25/12 1035  BP: 154/99  Pulse: 113  Temp: 97.7 F (36.5 C)  Resp: 20   Filed Vitals:   08/24/12 2131 08/25/12 0500 08/25/12 0546 08/25/12 1035  BP: 139/84  133/86 154/99  Pulse: 90  109 113  Temp: 97.4 F (36.3 C)  98.2 F (36.8 C) 97.7 F (36.5 C)  TempSrc: Oral  Oral Oral  Resp: 22  20 20   Height:      Weight:  109.4 kg (241 lb 2.9 oz)    SpO2: 97%  92% 100%    General: Pt is alert, follows commands appropriately, not in acute distress Cardiovascular: Regular rhythm, tachycardic, S1/S2 +,  no murmurs, no rubs, no gallops Respiratory: Clear to auscultation bilaterally, no wheezing, no crackles, no rhonchi Abdominal: Soft, non tender, non distended, bowel sounds +, no guarding Extremities: no edema, no cyanosis, pulses palpable bilaterally DP and PT Neuro: Grossly nonfocal  Discharge Instructions  Discharge Orders    Future Orders Please Complete By Expires   Diet - low sodium heart healthy      Increase activity slowly           Medication List     As of 08/25/2012 11:35 AM    TAKE these medications         allopurinol 300 MG tablet   Commonly known as: ZYLOPRIM   Take 150 mg by mouth daily.      diltiazem 60 MG tablet   Commonly known as: CARDIZEM   Take 60 mg by mouth 4 (four) times daily.      docusate sodium 100 MG capsule   Commonly known as: COLACE   Take 100 mg by mouth 2 (two) times daily as needed. constipation      gabapentin 300 MG capsule   Commonly known as: NEURONTIN   Take 300 mg by mouth 3 (three) times daily.      hydrALAZINE 50 MG tablet   Commonly known as: APRESOLINE   Take 50 mg by mouth 3 (three) times daily.      HYDROcodone-acetaminophen 5-325 MG per tablet   Commonly known as: NORCO/VICODIN   Take 2 tablets by mouth every 6 (six) hours as needed. pain      insulin aspart 100 UNIT/ML injection   Commonly known as: novoLOG   Inject 5 Units into the skin 3 (three) times daily before meals. cbg>150      insulin glargine 100 UNIT/ML injection   Commonly known as: LANTUS   Inject 5 Units into the skin at bedtime.      iron polysaccharides 150 MG capsule   Commonly known as: NIFEREX   Take 150 mg by mouth daily.      isosorbide dinitrate 20 MG tablet   Commonly known as: ISORDIL   Take 20 mg by mouth 3 (three) times daily.      mirtazapine 30 MG tablet   Commonly known as: REMERON   Take 30 mg by mouth at bedtime.      pantoprazole 40 MG tablet   Commonly known as: PROTONIX   Take 40 mg by mouth daily.      polyethylene glycol packet   Commonly known as: MIRALAX / GLYCOLAX   Take 17 g by mouth daily.      sotalol 80 MG tablet   Commonly known as: BETAPACE   Take 80 mg by mouth 2 (two) times daily.      torsemide 5 MG tablet   Commonly known as: DEMADEX   Take 5 mg by mouth daily.      warfarin 5 MG tablet   Commonly known as: COUMADIN   Take 5 mg by mouth daily.           Follow-up Information    Follow up with Ernestine Conrad, MD. In 2 weeks.    Contact information:   [facility] [address] [place] [address] [phone]           The results of significant diagnostics from this hospitalization (including imaging, microbiology, ancillary and laboratory) are listed below for reference.     Microbiology: Recent Results (from the past 240 hour(s))  MRSA  PCR SCREENING     Status: Normal   Collection Time   08/23/12  6:01 PM      Component Value Range Status Comment   MRSA by PCR NEGATIVE  NEGATIVE Final      Labs: Basic Metabolic Panel:  Lab 08/25/12 1610 08/24/12 1612 08/24/12 0415 08/23/12 1504 08/23/12 1216  NA 135 133* 130* 130* 127*  K 4.8 4.8 5.3* 5.3* 5.3*  CL 93* 92* 91* 90* 87*  CO2 37* 37* 34* 34* 33*  GLUCOSE 158* 174* 170* 185* 164*  BUN 50* 59* 61* 60* 62*  CREATININE 1.79* 2.04* 2.18* 2.18* 2.30*  CALCIUM 9.2 9.2 9.1 9.2 9.5  MG -- -- -- -- --  PHOS -- -- -- -- --   Liver Function Tests:  Lab 08/23/12 1216  AST 21  ALT 19  ALKPHOS 136*  BILITOT 0.4  PROT 7.6  ALBUMIN 3.8   CBC:  Lab 08/25/12 0455 08/24/12 0415 08/23/12 1216  WBC 6.3 6.9 7.2  NEUTROABS -- -- 5.7  HGB 9.8* 9.2* 9.8*  HCT 32.2* 29.5* 31.7*  MCV 85.2 84.0 83.4  PLT 187 190 220   Cardiac Enzymes:  Lab 08/23/12 1217  CKTOTAL --  CKMB --  CKMBINDEX --  TROPONINI <0.30   CBG:  Lab 08/25/12 0757 08/24/12 2134 08/24/12 1658 08/24/12 1156 08/24/12 0743  GLUCAP 146* 208* 151* 163* 177*     SIGNED: Time coordinating discharge: Over 30 minutes  MAGICK-Jarrah Babich, MD  [facility] 08/25/2012, 11:35 AM Pager [phone]  If 7PM-7AM, please contact night-coverage [email or web address] Password [email or web address]

## 2012-08-25 NOTE — Evaluation (Signed)
Occupational Therapy Evaluation Patient Details Name: Gina Ashley MRN: [id number] DOB: [birth year] Today's Date: 08/25/2012 Time: 0865-7846 OT Time Calculation (min): 46 min  OT Assessment / Plan / Recommendation Clinical Impression  Pt is 78 yo female with medical problems outlined below who presents to [facility] ED with main concern of progressive tremors in all extremities. She displays decreased strength and will benefit from skilled OT services to improve safety and independence with ADL for return to SNF for rehab.     OT Assessment  Patient needs continued OT Services    Follow Up Recommendations  Skilled nursing facility    Barriers to Discharge      Equipment Recommendations  None recommended by PT;None recommended by OT    Recommendations for Other Services    Frequency  Min 2X/week    Precautions / Restrictions Precautions Precautions: Fall        ADL  Eating/Feeding: Simulated;Supervision/safety;Other (comment) (to drink from cup--not fully awake initially) Where Assessed - Eating/Feeding: Bed level Grooming: Performed;Wash/dry face;Set up Where Assessed - Grooming: Supine, head of bed up Upper Body Bathing: Simulated;Chest;Right arm;Left arm;Abdomen;Moderate assistance;Other (comment) (pt weak in UEs.) Where Assessed - Upper Body Bathing: Unsupported sitting Lower Body Bathing: +2 Total assistance;Other (comment) (can reach tops of legs; has to have UE support in standing) Lower Body Bathing: Patient Percentage: 30% Where Assessed - Lower Body Bathing: Supported sit to stand;Other (comment) (used Corene Cornea to stand from EOB) Upper Body Dressing: Simulated;Moderate assistance Where Assessed - Upper Body Dressing: Unsupported sitting Lower Body Dressing: Simulated;+2 Total assistance Lower Body Dressing: Patient Percentage: 10% Where Assessed - Lower Body Dressing: Supported sit to stand Toilet Transfer: +2 Total assistance;Other (comment) (holding to Stedy bar to  help pull up.) Toilet Transfer: Patient Percentage: 60% Toilet Transfer Method: Sit to stand;Other (comment) (used Corene Cornea from EOB to chair) Toileting - Architect and Hygiene: Simulated;+2 Total assistance Toileting - Architect and Hygiene: Patient Percentage: 0% Where Assessed - Engineer, mining and Hygiene: Sit to stand from 3-in-1 or toilet ADL Comments: Per nursing tech pt's knees buckled over the weekend when trying to stand her from EOB. used Corene Cornea for transfer to chair today. Pt very difficult to arouse initially even with verbal, tactile stimuli including cold washcloth. Pt finally able to wake up and participate. Used bedpan as pt needign to urgently urinate and then up to chair with Stedy.     OT Diagnosis: Generalized weakness  OT Problem List: Decreased strength;Decreased activity tolerance;Decreased knowledge of use of DME or AE OT Treatment Interventions: Self-care/ADL training;Therapeutic activities;DME and/or AE instruction;Patient/family education   OT Goals Acute Rehab OT Goals OT Goal Formulation: With patient Time For Goal Achievement: 09/08/12 Potential to Achieve Goals: Good ADL Goals Pt Will Perform Grooming: with supervision;Unsupported;Sitting, edge of bed;Sitting, chair ADL Goal: Grooming - Progress: Goal set today Pt Will Perform Upper Body Bathing: with supervision;Sitting, chair;Sitting, edge of bed;Unsupported ADL Goal: Upper Body Bathing - Progress: Goal set today Pt Will Perform Lower Body Bathing: with 2+ total assist;Other (comment);Sit to stand from chair;Sit to stand from bed (pt 60%) ADL Goal: Lower Body Bathing - Progress: Goal set today Pt Will Transfer to Toilet: with 2+ total assist;with DME;Stand pivot transfer;3-in-1;Other (comment) (pt 50%) ADL Goal: Toilet Transfer - Progress: Goal set today Additional ADL Goal #1: pt will transfer from supine to EOB with supervision in prep for ADL. ADL Goal:  Additional Goal #1 - Progress: Goal set today  Visit Information  Last OT  Received On: 08/25/12 Assistance Needed: +2 PT/OT Co-Evaluation/Treatment: Yes    Subjective Data  Subjective: pt grunted a few times as therapy trying to wake her up. difficult to arouse. pt did state she needed to urinate once awake Patient Stated Goal: need to use bedpan and agreeable to therapy   Prior Functioning     Home Living Lives With: Other (Comment) (receiving therapy at [facility]) Available Help at Discharge: Skilled Nursing Facility Prior Function Level of Independence: Needs assistance (was at SNF, uncertain level of function) Needs Assistance: Bathing;Dressing;Toileting;Gait;Transfers;Meal Prep;Light Housekeeping Meal Prep: Total Light Housekeeping: Total Comments: Difficult to determine how much assist pt needed with ADL while at SNF for rehab but pt does state she did receive PT/OT and was working on sit to stand and walking but not able to state how far she could walk with assist.          Vision/Perception     Cognition  Overall Cognitive Status: No family/caregiver present to determine baseline cognitive functioning Arousal/Alertness: Lethargic Orientation Level: Disoriented to;Place;Time;Situation Behavior During Session: Flat affect Cognition - Other Comments: Pt sound asleep on arrival. Difficult to arouse with verbal and tacticle stimuli including cool washcloth. Pt started to wake up and requested bedpan. Did better as session progressed.    Extremity/Trunk Assessment Right Upper Extremity Assessment RUE ROM/Strength/Tone: Deficits RUE ROM/Strength/Tone Deficits: note edema in hands R >L. Encouraged digit and wrist ROM for pt to perform. Pt only able to achieve about 90 degrees AROM at shoulder due to weakness. AAROM WFL. elbow 3 to 3+/5 and light grasp.  Left Upper Extremity Assessment LUE ROM/Strength/Tone: Deficits LUE ROM/Strength/Tone Deficits: Shoulder flexion  limited to 30 degrees AROm but AAROM WFL. elbow grossly 3 to 3+/5 and note edema in wrist and hand. Light grasp. Right Lower Extremity Assessment RLE ROM/Strength/Tone: Deficits RLE ROM/Strength/Tone Deficits: strength at bes 3+ with jerking. potential to buckle Left Lower Extremity Assessment LLE ROM/Strength/Tone: Deficits LLE ROM/Strength/Tone Deficits: similar to RLE, jerking of musckles Trunk Assessment Trunk Assessment: Normal     Mobility Bed Mobility Bed Mobility: Rolling Right;Rolling Left;Right Sidelying to Sit Rolling Right: 5: Supervision;With rail Right Sidelying to Sit: 4: Min assist;HOB flat;With rails Details for Bed Mobility Assistance: pt was able to move her LLE to facilitate rolling. Transfers Transfers: Sit to Stand Sit to Stand: 1: +2 Total assist;From bed (from STEDy) Sit to Stand: Patient Percentage: 60% Stand to Sit: To chair/3-in-1;1: +2 Total assist Stand to Sit: Patient Percentage: 60% Transfer via Lift Equipment: Stedy Details for Transfer Assistance: Pt. noted with jerking of legs and arms while utilizing stedy to clean pt's bottom. feel potential for pt's legs tp buckle.     Shoulder Instructions     Exercise     Balance Static Sitting Balance Static Sitting - Balance Support: Bilateral upper extremity supported;Feet supported Static Sitting - Level of Assistance: 5: Stand by assistance Static Sitting - Comment/# of Minutes: x5 minutes.   End of Session OT - End of Session Activity Tolerance: Other (comment) (initially limited by arousal level.) Patient left: in chair;with call bell/phone within reach;with chair alarm set Nurse Communication: Mobility status  GO     Lennox Laity [phone] 08/25/2012, 11:55 AM

## 2012-08-25 NOTE — Evaluation (Signed)
Physical Therapy Evaluation Patient Details Name: Gina Ashley MRN: [id number] DOB: Sep 18, 1927 Today's Date: 08/25/2012 Time: 4098-1191 PT Time Calculation (min): 46 min  PT Assessment / Plan / Recommendation Clinical Impression  Pt. was admitted form SNF rehab with jerking tremors of extremeties. Pt has been in rehab  for indetrminate time. Pt. was very lethargic but gradually aroused and was able to participate. Pt. will benefit from PT while on acute. Recommend return to SNF.    PT Assessment  Patient needs continued PT services    Follow Up Recommendations  Post acute inpatient    Does the patient have the potential to tolerate intense rehabilitation   No, Recommend SNF  Barriers to Discharge        Equipment Recommendations  None recommended by PT    Recommendations for Other Services     Frequency Min 3X/week    Precautions / Restrictions Precautions Precautions: Fall   Pertinent Vitals/Pain sats 94 % 2 l HR 111      Mobility  Bed Mobility Bed Mobility: Rolling Right;Rolling Left;Right Sidelying to Sit Rolling Right: 5: Supervision;With rail Right Sidelying to Sit: 4: Min assist;HOB flat;With rails Details for Bed Mobility Assistance: pt was able to move her LLE to facilitate rolling. Transfers Transfers: Sit to Stand;Stand to Sit Sit to Stand: 1: +2 Total assist;From bed (from STEDy) Sit to Stand: Patient Percentage: 60% Stand to Sit: To chair/3-in-1;1: +2 Total assist Stand to Sit: Patient Percentage: 60% Transfer via Lift Equipment: Stedy Details for Transfer Assistance: Pt. noted with jerking of legs and arms while utilizing stedy to clean pt's bottom. feel potential for pt's legs tp buckle.    Shoulder Instructions     Exercises     PT Diagnosis: Generalized weakness  PT Problem List: Decreased strength;Decreased activity tolerance;Decreased mobility;Decreased safety awareness;Decreased knowledge of precautions PT Treatment Interventions: Gait  training;Functional mobility training;Therapeutic activities;Therapeutic exercise;Patient/family education   PT Goals Acute Rehab PT Goals PT Goal Formulation: With patient Time For Goal Achievement: 09/08/12 Potential to Achieve Goals: Good Pt will go Supine/Side to Sit: with supervision PT Goal: Supine/Side to Sit - Progress: Goal set today Pt will go Sit to Supine/Side: with supervision PT Goal: Sit to Supine/Side - Progress: Goal set today Pt will go Sit to Stand: with min assist PT Goal: Sit to Stand - Progress: Goal set today Pt will go Stand to Sit: with min assist PT Goal: Stand to Sit - Progress: Goal set today Pt will Transfer Bed to Chair/Chair to Bed: with min assist PT Transfer Goal: Bed to Chair/Chair to Bed - Progress: Goal set today Pt will Ambulate: 16 - 50 feet;with +2 total assist;with rolling walker PT Goal: Ambulate - Progress: Goal set today  Visit Information  Last PT Received On: 08/25/12 Assistance Needed: +2    Subjective Data  Subjective: I am hungry Patient Stated Goal: Pt agreed to return to rehab   Prior Functioning  Home Living Lives With: Other (Comment) (receiving therapy at [facility]) Available Help at Discharge: Skilled Nursing Facility Prior Function Level of Independence: Needs assistance (was at SNF, uncertain level of function) Needs Assistance: Bathing;Dressing;Toileting;Gait;Transfers;Meal Prep;Light Housekeeping Meal Prep: Total Light Housekeeping: Total Comments: Difficult to determine how much assist pt needed with ADL while at SNF for rehab but pt does state she did receive PT/OT and was working on sit to stand and walking but not able to state how far she could walk with assist.     Cognition  Overall Cognitive Status: No  family/caregiver present to determine baseline cognitive functioning Arousal/Alertness: Lethargic Orientation Level: Disoriented to;Place;Time;Situation Behavior During Session: Flat affect Cognition -  Other Comments: Pt sound asleep on arrival. Difficult to arouse with verbal and tacticle stimuli including cool washcloth. Pt started to wake up and requested bedpan. Did better as session progressed.    Extremity/Trunk Assessment Right Upper Extremity Assessment RUE ROM/Strength/Tone: Deficits RUE ROM/Strength/Tone Deficits: note edema in hands R >L. Encouraged digit and wrist ROM for pt to perform. Pt only able to achieve about 90 degrees AROM at shoulder due to weakness. AAROM WFL. elbow 3 to 3+/5 and light grasp.  Left Upper Extremity Assessment LUE ROM/Strength/Tone: Deficits LUE ROM/Strength/Tone Deficits: Shoulder flexion limited to 30 degrees AROm but AAROM WFL. elbow grossly 3 to 3+/5 and note edema in wrist and hand. Light grasp. Right Lower Extremity Assessment RLE ROM/Strength/Tone: Deficits RLE ROM/Strength/Tone Deficits: strength at bes 3+ with jerking. potential to buckle Left Lower Extremity Assessment LLE ROM/Strength/Tone: Deficits LLE ROM/Strength/Tone Deficits: similar to RLE, jerking of musckles Trunk Assessment Trunk Assessment: Normal   Balance Static Sitting Balance Static Sitting - Balance Support: Bilateral upper extremity supported;Feet supported Static Sitting - Level of Assistance: 5: Stand by assistance Static Sitting - Comment/# of Minutes: x5 minutes.  End of Session PT - End of Session Activity Tolerance: Patient tolerated treatment well Patient left: in chair;with call bell/phone within reach;with chair alarm set Nurse Communication: Mobility status;Need for lift equipment (stedy or maxisky)  GP     Rada Hay 08/25/2012, 11:54 AM  [phone]

## 2012-08-27 ENCOUNTER — Inpatient Hospital Stay (HOSPITAL_COMMUNITY)
Admission: EM | Admit: 2012-08-27 | Discharge: 2012-09-15 | DRG: 480 | Disposition: A | Discharge status: Skilled Nursing Facility | Payer: Medicare Other | Attending: Internal Medicine | Admitting: Internal Medicine

## 2012-08-27 ENCOUNTER — Emergency Department (HOSPITAL_COMMUNITY): Payer: Medicare Other

## 2012-08-27 ENCOUNTER — Encounter (HOSPITAL_COMMUNITY): Payer: Self-pay | Admitting: *Deleted

## 2012-08-27 DIAGNOSIS — A498 Other bacterial infections of unspecified site: Secondary | ICD-10-CM | POA: Diagnosis present

## 2012-08-27 DIAGNOSIS — J9611 Chronic respiratory failure with hypoxia: Secondary | ICD-10-CM

## 2012-08-27 DIAGNOSIS — S8292XA Unspecified fracture of left lower leg, initial encounter for closed fracture: Secondary | ICD-10-CM

## 2012-08-27 DIAGNOSIS — Z66 Do not resuscitate: Secondary | ICD-10-CM | POA: Diagnosis present

## 2012-08-27 DIAGNOSIS — W010XXA Fall on same level from slipping, tripping and stumbling without subsequent striking against object, initial encounter: Secondary | ICD-10-CM | POA: Diagnosis present

## 2012-08-27 DIAGNOSIS — N189 Chronic kidney disease, unspecified: Secondary | ICD-10-CM

## 2012-08-27 DIAGNOSIS — I129 Hypertensive chronic kidney disease with stage 1 through stage 4 chronic kidney disease, or unspecified chronic kidney disease: Secondary | ICD-10-CM | POA: Diagnosis present

## 2012-08-27 DIAGNOSIS — G4733 Obstructive sleep apnea (adult) (pediatric): Secondary | ICD-10-CM | POA: Diagnosis present

## 2012-08-27 DIAGNOSIS — E662 Morbid (severe) obesity with alveolar hypoventilation: Secondary | ICD-10-CM | POA: Diagnosis present

## 2012-08-27 DIAGNOSIS — K449 Diaphragmatic hernia without obstruction or gangrene: Secondary | ICD-10-CM | POA: Diagnosis present

## 2012-08-27 DIAGNOSIS — E873 Alkalosis: Secondary | ICD-10-CM | POA: Diagnosis not present

## 2012-08-27 DIAGNOSIS — S72401A Unspecified fracture of lower end of right femur, initial encounter for closed fracture: Secondary | ICD-10-CM

## 2012-08-27 DIAGNOSIS — F329 Major depressive disorder, single episode, unspecified: Secondary | ICD-10-CM | POA: Diagnosis present

## 2012-08-27 DIAGNOSIS — Z8679 Personal history of other diseases of the circulatory system: Secondary | ICD-10-CM

## 2012-08-27 DIAGNOSIS — J9620 Acute and chronic respiratory failure, unspecified whether with hypoxia or hypercapnia: Secondary | ICD-10-CM | POA: Diagnosis present

## 2012-08-27 DIAGNOSIS — N184 Chronic kidney disease, stage 4 (severe): Secondary | ICD-10-CM | POA: Diagnosis present

## 2012-08-27 DIAGNOSIS — E119 Type 2 diabetes mellitus without complications: Secondary | ICD-10-CM | POA: Diagnosis present

## 2012-08-27 DIAGNOSIS — R5383 Other fatigue: Secondary | ICD-10-CM

## 2012-08-27 DIAGNOSIS — N390 Urinary tract infection, site not specified: Secondary | ICD-10-CM | POA: Diagnosis not present

## 2012-08-27 DIAGNOSIS — E876 Hypokalemia: Secondary | ICD-10-CM | POA: Diagnosis not present

## 2012-08-27 DIAGNOSIS — Z7901 Long term (current) use of anticoagulants: Secondary | ICD-10-CM

## 2012-08-27 DIAGNOSIS — N179 Acute kidney failure, unspecified: Secondary | ICD-10-CM | POA: Diagnosis not present

## 2012-08-27 DIAGNOSIS — Z96659 Presence of unspecified artificial knee joint: Secondary | ICD-10-CM

## 2012-08-27 DIAGNOSIS — D638 Anemia in other chronic diseases classified elsewhere: Secondary | ICD-10-CM

## 2012-08-27 DIAGNOSIS — Y9210 Unspecified residential institution as the place of occurrence of the external cause: Secondary | ICD-10-CM | POA: Diagnosis present

## 2012-08-27 DIAGNOSIS — J4489 Other specified chronic obstructive pulmonary disease: Secondary | ICD-10-CM | POA: Diagnosis present

## 2012-08-27 DIAGNOSIS — Z6841 Body Mass Index (BMI) 40.0 and over, adult: Secondary | ICD-10-CM

## 2012-08-27 DIAGNOSIS — E46 Unspecified protein-calorie malnutrition: Secondary | ICD-10-CM | POA: Diagnosis present

## 2012-08-27 DIAGNOSIS — B961 Klebsiella pneumoniae [K. pneumoniae] as the cause of diseases classified elsewhere: Secondary | ICD-10-CM | POA: Diagnosis present

## 2012-08-27 DIAGNOSIS — E861 Hypovolemia: Secondary | ICD-10-CM

## 2012-08-27 DIAGNOSIS — R52 Pain, unspecified: Secondary | ICD-10-CM | POA: Diagnosis present

## 2012-08-27 DIAGNOSIS — S82102A Unspecified fracture of upper end of left tibia, initial encounter for closed fracture: Secondary | ICD-10-CM

## 2012-08-27 DIAGNOSIS — R471 Dysarthria and anarthria: Secondary | ICD-10-CM

## 2012-08-27 DIAGNOSIS — D631 Anemia in chronic kidney disease: Secondary | ICD-10-CM | POA: Diagnosis present

## 2012-08-27 DIAGNOSIS — K219 Gastro-esophageal reflux disease without esophagitis: Secondary | ICD-10-CM | POA: Diagnosis present

## 2012-08-27 DIAGNOSIS — J309 Allergic rhinitis, unspecified: Secondary | ICD-10-CM

## 2012-08-27 DIAGNOSIS — R791 Abnormal coagulation profile: Secondary | ICD-10-CM | POA: Diagnosis present

## 2012-08-27 DIAGNOSIS — R5381 Other malaise: Secondary | ICD-10-CM | POA: Diagnosis present

## 2012-08-27 DIAGNOSIS — I4891 Unspecified atrial fibrillation: Secondary | ICD-10-CM

## 2012-08-27 DIAGNOSIS — S72409A Unspecified fracture of lower end of unspecified femur, initial encounter for closed fracture: Secondary | ICD-10-CM

## 2012-08-27 DIAGNOSIS — IMO0002 Reserved for concepts with insufficient information to code with codable children: Secondary | ICD-10-CM

## 2012-08-27 DIAGNOSIS — I10 Essential (primary) hypertension: Secondary | ICD-10-CM | POA: Diagnosis present

## 2012-08-27 DIAGNOSIS — S8291XA Unspecified fracture of right lower leg, initial encounter for closed fracture: Secondary | ICD-10-CM | POA: Diagnosis present

## 2012-08-27 DIAGNOSIS — S72453A Displaced supracondylar fracture without intracondylar extension of lower end of unspecified femur, initial encounter for closed fracture: Principal | ICD-10-CM | POA: Diagnosis present

## 2012-08-27 DIAGNOSIS — G4700 Insomnia, unspecified: Secondary | ICD-10-CM

## 2012-08-27 DIAGNOSIS — G9349 Other encephalopathy: Secondary | ICD-10-CM | POA: Diagnosis present

## 2012-08-27 DIAGNOSIS — J90 Pleural effusion, not elsewhere classified: Secondary | ICD-10-CM | POA: Diagnosis present

## 2012-08-27 DIAGNOSIS — R0689 Other abnormalities of breathing: Secondary | ICD-10-CM | POA: Diagnosis present

## 2012-08-27 DIAGNOSIS — I480 Paroxysmal atrial fibrillation: Secondary | ICD-10-CM | POA: Diagnosis present

## 2012-08-27 DIAGNOSIS — N039 Chronic nephritic syndrome with unspecified morphologic changes: Secondary | ICD-10-CM | POA: Diagnosis present

## 2012-08-27 DIAGNOSIS — J449 Chronic obstructive pulmonary disease, unspecified: Secondary | ICD-10-CM

## 2012-08-27 DIAGNOSIS — E871 Hypo-osmolality and hyponatremia: Secondary | ICD-10-CM | POA: Diagnosis present

## 2012-08-27 DIAGNOSIS — I272 Pulmonary hypertension, unspecified: Secondary | ICD-10-CM

## 2012-08-27 DIAGNOSIS — I5032 Chronic diastolic (congestive) heart failure: Secondary | ICD-10-CM | POA: Diagnosis present

## 2012-08-27 DIAGNOSIS — J9600 Acute respiratory failure, unspecified whether with hypoxia or hypercapnia: Secondary | ICD-10-CM | POA: Diagnosis present

## 2012-08-27 DIAGNOSIS — T45515A Adverse effect of anticoagulants, initial encounter: Secondary | ICD-10-CM | POA: Diagnosis present

## 2012-08-27 DIAGNOSIS — S82109A Unspecified fracture of upper end of unspecified tibia, initial encounter for closed fracture: Secondary | ICD-10-CM | POA: Diagnosis present

## 2012-08-27 DIAGNOSIS — E875 Hyperkalemia: Secondary | ICD-10-CM | POA: Diagnosis present

## 2012-08-27 DIAGNOSIS — F3289 Other specified depressive episodes: Secondary | ICD-10-CM | POA: Diagnosis present

## 2012-08-27 DIAGNOSIS — J9819 Other pulmonary collapse: Secondary | ICD-10-CM | POA: Diagnosis not present

## 2012-08-27 HISTORY — DX: Chronic diastolic (congestive) heart failure: I50.32

## 2012-08-27 LAB — URINALYSIS, MICROSCOPIC ONLY
Bilirubin Urine: NEGATIVE
Leukocytes, UA: NEGATIVE
Nitrite: NEGATIVE
Specific Gravity, Urine: 1.015 (ref 1.005–1.030)
Urobilinogen, UA: 0.2 mg/dL (ref 0.0–1.0)
pH: 5 (ref 5.0–8.0)

## 2012-08-27 LAB — BASIC METABOLIC PANEL
BUN: 46 mg/dL — ABNORMAL HIGH (ref 6–23)
CO2: 37 mEq/L — ABNORMAL HIGH (ref 19–32)
Calcium: 9 mg/dL (ref 8.4–10.5)
Creatinine, Ser: 1.72 mg/dL — ABNORMAL HIGH (ref 0.50–1.10)
GFR calc non Af Amer: 26 mL/min — ABNORMAL LOW (ref >90)
Glucose, Bld: 214 mg/dL — ABNORMAL HIGH (ref 70–99)

## 2012-08-27 LAB — GLUCOSE, CAPILLARY: Glucose-Capillary: 193 mg/dL — ABNORMAL HIGH (ref 70–99)

## 2012-08-27 LAB — CBC WITH DIFFERENTIAL/PLATELET
Eosinophils Absolute: 0.2 10*3/uL (ref 0.0–0.7)
Eosinophils Relative: 3 % (ref 0–5)
HCT: 25.5 % — ABNORMAL LOW (ref 36.0–46.0)
Lymphocytes Relative: 10 % — ABNORMAL LOW (ref 12–46)
Lymphs Abs: 0.8 10*3/uL (ref 0.7–4.0)
MCH: 26.3 pg (ref 26.0–34.0)
MCV: 85 fL (ref 78.0–100.0)
Monocytes Absolute: 0.6 10*3/uL (ref 0.1–1.0)
Monocytes Relative: 8 % (ref 3–12)
Platelets: 154 10*3/uL (ref 150–400)
RBC: 3 MIL/uL — ABNORMAL LOW (ref 3.87–5.11)
WBC: 7.9 10*3/uL (ref 4.0–10.5)

## 2012-08-27 LAB — MAGNESIUM: Magnesium: 2.3 mg/dL (ref 1.5–2.5)

## 2012-08-27 LAB — PHOSPHORUS: Phosphorus: 4.7 mg/dL — ABNORMAL HIGH (ref 2.3–4.6)

## 2012-08-27 MED ORDER — SODIUM CHLORIDE 0.9 % IJ SOLN: 3.0000 mL | INTRAMUSCULAR | Status: DC | PRN

## 2012-08-27 MED ORDER — ONDANSETRON HCL 4 MG/2ML IJ SOLN
4.0000 mg | Freq: Four times a day (QID) | INTRAMUSCULAR | Status: DC | PRN
  Administered 2012-09-03 – 2012-09-08 (×4): 4 mg via INTRAVENOUS
  Filled 2012-08-27 (×4): qty 2

## 2012-08-27 MED ORDER — SODIUM CHLORIDE 0.9 % IJ SOLN: 3.0000 mL | Freq: Two times a day (BID) | INTRAMUSCULAR | Status: DC

## 2012-08-27 MED ORDER — ALBUTEROL SULFATE (5 MG/ML) 0.5% IN NEBU: 2.5000 mg | INHALATION_SOLUTION | RESPIRATORY_TRACT | Status: DC | PRN

## 2012-08-27 MED ORDER — ONDANSETRON HCL 4 MG PO TABS
4.0000 mg | ORAL_TABLET | Freq: Four times a day (QID) | ORAL | Status: DC | PRN
  Administered 2012-09-04: 4 mg via ORAL
  Filled 2012-08-27: qty 1

## 2012-08-27 MED ORDER — ONDANSETRON HCL 4 MG/2ML IJ SOLN
4.0000 mg | Freq: Once | INTRAMUSCULAR | Status: AC
  Administered 2012-08-27: 4 mg via INTRAVENOUS
  Filled 2012-08-27: qty 2

## 2012-08-27 MED ORDER — DOCUSATE SODIUM 100 MG PO CAPS
100.0000 mg | ORAL_CAPSULE | Freq: Two times a day (BID) | ORAL | Status: DC | PRN
  Administered 2012-08-31 – 2012-09-01 (×2): 100 mg via ORAL
  Filled 2012-08-27: qty 1

## 2012-08-27 MED ORDER — NALOXONE HCL 0.4 MG/ML IJ SOLN
0.4000 mg | Freq: Once | INTRAMUSCULAR | Status: AC
  Administered 2012-08-27: 0.4 mg via INTRAVENOUS
  Filled 2012-08-27: qty 1

## 2012-08-27 MED ORDER — SOTALOL HCL 80 MG PO TABS
80.0000 mg | ORAL_TABLET | Freq: Two times a day (BID) | ORAL | Status: DC
  Administered 2012-08-27 – 2012-09-03 (×13): 80 mg via ORAL
  Filled 2012-08-27 (×17): qty 1

## 2012-08-27 MED ORDER — HYDROMORPHONE HCL PF 1 MG/ML IJ SOLN
0.5000 mg | Freq: Once | INTRAMUSCULAR | Status: AC
  Administered 2012-08-27: 0.5 mg via INTRAVENOUS
  Filled 2012-08-27: qty 1

## 2012-08-27 MED ORDER — HYDROCODONE-ACETAMINOPHEN 5-325 MG PO TABS
2.0000 | ORAL_TABLET | Freq: Four times a day (QID) | ORAL | Status: DC | PRN
  Administered 2012-08-27 – 2012-08-28 (×2): 2 via ORAL
  Filled 2012-08-27 (×2): qty 2

## 2012-08-27 MED ORDER — HYDROMORPHONE HCL PF 1 MG/ML IJ SOLN: 0.5000 mg | INTRAMUSCULAR | Status: DC | PRN

## 2012-08-27 MED ORDER — DILTIAZEM HCL 60 MG PO TABS
60.0000 mg | ORAL_TABLET | Freq: Four times a day (QID) | ORAL | Status: DC
  Administered 2012-08-27 – 2012-08-28 (×4): 60 mg via ORAL
  Filled 2012-08-27 (×5): qty 1

## 2012-08-27 MED ORDER — SODIUM CHLORIDE 0.9 % IV SOLN: 250.0000 mL | INTRAVENOUS | Status: DC | PRN

## 2012-08-27 MED ORDER — GABAPENTIN 300 MG PO CAPS
300.0000 mg | ORAL_CAPSULE | Freq: Three times a day (TID) | ORAL | Status: DC
  Administered 2012-08-28 – 2012-08-29 (×4): 300 mg via ORAL
  Filled 2012-08-27 (×7): qty 1

## 2012-08-27 MED ORDER — INSULIN ASPART 100 UNIT/ML ~~LOC~~ SOLN
0.0000 [IU] | Freq: Three times a day (TID) | SUBCUTANEOUS | Status: DC
  Administered 2012-08-28: 2 [IU] via SUBCUTANEOUS
  Administered 2012-08-28 (×2): 3 [IU] via SUBCUTANEOUS
  Administered 2012-08-29 (×2): 5 [IU] via SUBCUTANEOUS
  Administered 2012-08-29: 3 [IU] via SUBCUTANEOUS
  Administered 2012-08-30 (×2): 5 [IU] via SUBCUTANEOUS
  Administered 2012-08-30 – 2012-08-31 (×2): 3 [IU] via SUBCUTANEOUS
  Administered 2012-08-31 – 2012-09-02 (×7): 5 [IU] via SUBCUTANEOUS
  Administered 2012-09-02: 2 [IU] via SUBCUTANEOUS
  Administered 2012-09-03: 5 [IU] via SUBCUTANEOUS
  Administered 2012-09-03: 8 [IU] via SUBCUTANEOUS
  Administered 2012-09-03: 3 [IU] via SUBCUTANEOUS
  Administered 2012-09-04: 5 [IU] via SUBCUTANEOUS
  Administered 2012-09-04: 3 [IU] via SUBCUTANEOUS

## 2012-08-27 MED ORDER — PANTOPRAZOLE SODIUM 40 MG IV SOLR
40.0000 mg | Freq: Every day | INTRAVENOUS | Status: DC
  Administered 2012-08-27 – 2012-08-29 (×3): 40 mg via INTRAVENOUS
  Filled 2012-08-27 (×4): qty 40

## 2012-08-27 MED ORDER — INSULIN GLARGINE 100 UNIT/ML ~~LOC~~ SOLN
5.0000 [IU] | Freq: Every day | SUBCUTANEOUS | Status: DC
  Administered 2012-08-27 – 2012-08-29 (×3): 5 [IU] via SUBCUTANEOUS

## 2012-08-27 MED ORDER — ISOSORBIDE DINITRATE 20 MG PO TABS
20.0000 mg | ORAL_TABLET | Freq: Three times a day (TID) | ORAL | Status: DC
  Administered 2012-08-28 – 2012-09-08 (×32): 20 mg via ORAL
  Filled 2012-08-27 (×39): qty 1

## 2012-08-27 MED ORDER — TORSEMIDE 5 MG PO TABS
5.0000 mg | ORAL_TABLET | Freq: Every day | ORAL | Status: DC
  Filled 2012-08-27: qty 1

## 2012-08-27 NOTE — Progress Notes (Signed)
[facility] ED CM discussed case with ED SW EPIC imaging, labs reviewed Noted 1. Interval development of a moderate-sized right-sided pleural effusion with persistent findings of mild pulmonary edema. Further evaluation with a PA and lateral chest radiograph may be obtained as clinically indicated. 2. Possible calcification within the left lobe of the thyroid. Further evaluation with thyroid ultrasound may be performed in the non emergent setting as clinically indicated--Oblique, minimally displaced, slightly impacted fracture of the distal femoral metaphysis without definite extension to involve the femoral component of the right total knee replacement--Oblique, displaced fracture of the proximal tibial metaphysis with extension to involve the tibial component of the left total knee replacement hardware

## 2012-08-27 NOTE — ED Notes (Signed)
YNW:[id number]<OZ> Expected date:08/27/12<BR> Expected time: 2:48 PM<BR> Means of arrival:Ambulance<BR> Comments:<BR> Elderly, R femur fx and L tib fx/FALL

## 2012-08-27 NOTE — ED Provider Notes (Signed)
History     CSN: [id number]  Arrival date & time 08/27/12  1455   First MD Initiated Contact with Patient 08/27/12 1515      Chief Complaint  Patient presents with  . Fall  . Leg Injury    (Consider location/radiation/quality/duration/timing/severity/associated sxs/prior treatment) HPI.... level V caveat for urgent need for intervention.   Patient is in [facility] for her severe deconditioning.  She apparently tried to stand up last night near her bed and fell.  She now has pain in her distal right femur and proximal left tibia.  No head or neck trauma. Status post bilateral total knee replacements by Dr. Despina Hick  Past Medical History  Diagnosis Date  . Diabetes mellitus     x 15 yrs  . GERD (gastroesophageal reflux disease)   . Back pain, chronic   . Pain in shoulder   . Pain in ankle   . Hiatal hernia   . HTN (hypertension)     all her life  . CKD (chronic kidney disease), stage III     GFR: 38  . Atrial fibrillation     since 1996  . Sleep apnea   . Fall 3 weeks ago    was evaluated at [facility]  . OSA on CPAP   . Depression   . PONV (postoperative nausea and vomiting)     difficult to wake up    Past Surgical History  Procedure Date  . Bunionectomy     bilateral  . Cholecystectomy   . Rotator cuff repair     2002  . Total knee arthroplasty     bilateral 2001  . Hammer toe surgery     2004  . Colonoscopy 07/11/2011    Procedure: COLONOSCOPY;  Surgeon: Malissa Hippo, MD;  Location: [facility];  Service: Endoscopy;  Laterality: N/A;  3:00    Family History  Problem Relation Age of Onset  . Colon cancer Brother     History  Substance Use Topics  . Smoking status: Never Smoker   . Smokeless tobacco: Never Used  . Alcohol Use: No    OB History    Grav Para Term Preterm Abortions TAB SAB Ect Mult Living                  Review of Systems  All other systems reviewed and are negative.    Allergies  Lasix; Morphine sulfate; Percocet;  Remeron; Tuna; and Penicillins  Home Medications   Current Outpatient Rx  Name Route Sig Dispense Refill  . ALLOPURINOL 300 MG PO TABS Oral Take 150 mg by mouth daily.    Marland Kitchen DILTIAZEM HCL 60 MG PO TABS Oral Take 60 mg by mouth 4 (four) times daily.    Marland Kitchen DOCUSATE SODIUM 100 MG PO CAPS Oral Take 100 mg by mouth 2 (two) times daily as needed. constipation    . GABAPENTIN 300 MG PO CAPS Oral Take 300 mg by mouth 3 (three) times daily.     Marland Kitchen HYDRALAZINE HCL 50 MG PO TABS Oral Take 50 mg by mouth 3 (three) times daily.    Marland Kitchen HYDROCODONE-ACETAMINOPHEN 5-325 MG PO TABS Oral Take 2 tablets by mouth every 6 (six) hours as needed. pain    . INSULIN ASPART 100 UNIT/ML [place] SOLN Subcutaneous Inject 5 Units into the skin 3 (three) times daily before meals. cbg>150    . INSULIN GLARGINE 100 UNIT/ML [place] SOLN Subcutaneous Inject 5 Units into the skin at bedtime.    Marland Kitchen  POLYSACCHARIDE IRON COMPLEX 150 MG PO CAPS Oral Take 150 mg by mouth daily.    . ISOSORBIDE DINITRATE 20 MG PO TABS Oral Take 20 mg by mouth 3 (three) times daily.    Marland Kitchen PANTOPRAZOLE SODIUM 40 MG PO TBEC Oral Take 40 mg by mouth daily.    Marland Kitchen POLYETHYLENE GLYCOL 3350 PO PACK Oral Take 17 g by mouth daily.    Marland Kitchen SOTALOL HCL 80 MG PO TABS Oral Take 80 mg by mouth 2 (two) times daily.    . TORSEMIDE 5 MG PO TABS Oral Take 5 mg by mouth daily.    . WARFARIN SODIUM 2.5 MG PO TABS Oral Take 2.5 mg by mouth daily. Take with 3 mg    . WARFARIN SODIUM 3 MG PO TABS Oral Take 3 mg by mouth daily. Take with 2.5 mg      BP 149/96  Pulse 118  Temp 98 F (36.7 C) (Oral)  Resp 16  SpO2 95%  Physical Exam  Nursing note and vitals reviewed. Constitutional: She is oriented to person, place, and time.       Obese,  speaks slowly  HENT:  Head: Normocephalic and atraumatic.  Eyes: Conjunctivae normal and EOM are normal. Pupils are equal, round, and reactive to light.  Neck: Normal range of motion. Neck supple.  Cardiovascular: Normal rate, regular rhythm and  normal heart sounds.   Pulmonary/Chest: Effort normal and breath sounds normal.  Abdominal: Soft. Bowel sounds are normal.  Musculoskeletal:       Bilateral lower extremities:  Tender distal right femur and proximal left tibia.   Pain with range of motion  Neurological: She is alert and oriented to person, place, and time.  Skin: Skin is warm and dry.  Psychiatric: She has a normal mood and affect.    ED Course  Procedures (including critical care time)  Labs Reviewed  CBC WITH DIFFERENTIAL - Abnormal; Notable for the following:    RBC 3.00 (*)     Hemoglobin 7.9 (*)     HCT 25.5 (*)     RDW 16.6 (*)     Neutrophils Relative 79 (*)     Lymphocytes Relative 10 (*)     All other components within normal limits  BASIC METABOLIC PANEL - Abnormal; Notable for the following:    Sodium 133 (*)     Chloride 93 (*)     CO2 37 (*)     Glucose, Bld 214 (*)     BUN 46 (*)     Creatinine, Ser 1.72 (*)     GFR calc non Af Amer 26 (*)     GFR calc Af Amer 30 (*)     All other components within normal limits  PROTIME-INR - Abnormal; Notable for the following:    Prothrombin Time 29.0 (*)     INR 2.92 (*)     All other components within normal limits  URINALYSIS, MICROSCOPIC ONLY  URINE CULTURE   Dg Chest 1 View  08/27/2012  *RADIOLOGY REPORT*  Clinical Data: Post fall, history of atrial fibrillation  CHEST - 1 VIEW  Comparison: 07/26/2012; 07/25/2012; 07/18/2012  Findings:  Grossly unchanged enlarged cardiac silhouette and mediastinal contours.  Interval development of a moderate-sized right-sided pleural effusion.  Pulmonary vasculature remains indistinct with cephalization of flow.  No definite left-sided pleural effusion. No definite pneumothorax.  Calcification overlying the left lower neck may be within the left lobe of the thyroid.  No definite acute osseous abnormalities.  IMPRESSION: 1.  Interval development of a moderate-sized right-sided pleural effusion with persistent findings of  mild pulmonary edema.  Further evaluation with a PA and lateral chest radiograph may be obtained as clinically indicated. 2.  Possible calcification within the left lobe of the thyroid. Further evaluation with thyroid ultrasound may be performed in the non emergent setting as clinically indicated   Original Report Authenticated By: Waynard Reeds, M.D.    Dg Femur Right  08/27/2012  *RADIOLOGY REPORT*  Clinical Data: Post fall, now with severe pain and tenderness involving the distal aspect of the femur  RIGHT FEMUR - 2 VIEW  Comparison: None.  Findings:  There is an oblique, minimally displaced, slightly impacted fracture of the distal femoral metaphysis.  There is no definitive extension of the fracture into the right total knee replacement hardware.  No definite evidence of hardware failure or loosening. No definite suprapatellar joint effusion, though evaluation degraded due to technique.  No radiopaque foreign body.  IMPRESSION: Oblique, minimally displaced, slightly impacted fracture of the distal femoral metaphysis without definite extension to involve the femoral component of the right total knee replacement.   Original Report Authenticated By: Waynard Reeds, M.D.    Dg Tibia/fibula Left  08/27/2012  *RADIOLOGY REPORT*  Clinical Data: Post fall, now with leg pain  LEFT TIBIA AND FIBULA - 2 VIEW  Comparison: None.  Findings: There is an oblique, displaced fracture of the proximal tibial metaphysis with apparent extension to involve the tibial component of the left total knee hardware, which appears minimally angulated, apex ventral.  This is the adjacent soft tissue swelling.  No radiopaque foreign body.  Vascular calcifications.  IMPRESSION: Oblique, displaced fracture of the proximal tibial metaphysis with extension to involve the tibial component of the left total knee replacement hardware.   Original Report Authenticated By: Waynard Reeds, M.D.      No diagnosis found.    MDM    Patient is status post bilateral total knee replacements.  Screening x-rays show fracture of the distal right femur proximal left tibia. Discussed with Dr. Victorino Dike from [facility] orthopedics. Admit to Dr. Cena Benton hospitalist        Donnetta Hutching, MD 08/27/12 [phone]

## 2012-08-27 NOTE — H&P (Addendum)
[facility] History and Physical  Gina Ashley XBJ:[id number] DOB: 1926/12/17 DOA: 08/27/2012  Referring physician: Dr. Adriana Simas PCP: Ernestine Conrad, MD  Specialists: Orthopaedic: Dr. Victorino Dike  Chief Complaint: Fall with subsequent pain in lower extremities  HPI: Gina Ashley is a 78 y.o. female  Pt reportedly from [facility] and had an unwitnessed fall around 400 am.  Reportedly she had recently been at [facility] due to recent deconditioning.  After the fall patient subsequently developed lower extremity discomfort and was subsequently taken to [facility] ED for further evaluation.  History is obtained from daughters in the room and ED records as patient was given dilaudid for BL LE discomfort and subsequently "is sleepy" per family.  In the room initially ortho technician was at bedside placing braces and patient had discomfort with manipulation of lower extremities while placing gauze.  Prior to arrival to ED patient was given Fentanyl 250 mcg intranasally, while in the ED received dilaudid 0.5 mg IV x 1 and zofran.  ED x rays were obtained of lower extremities which showed:   Oblique,displaced fracture of the proximal tibial metaphysis with  extension to involve the tibial component of the left total knee  replacement hardware.  Oblique, minimally displaced, slightly impacted fracture of the  distal femoral metaphysis without definite extension to involve the  femoral component of the right total knee replacement.  ED physician subsequently consulted Orthopaedic surgeon Dr. Victorino Dike.  Chest x ray 1 view showed: Interval development of a moderate-sized right-sided pleural  effusion with persistent findings of mild pulmonary edema   Review of Systems: Unable to obtain due to somnolence from recent Dilaudid administration (as daughters report patient got sleepy after dilaudid and she was more conversant). Blood pressures recorded as 149/96 at 1516 with Sinus tachycardia in 110 in monitor on  my exam.  Past Medical History  Diagnosis Date  . Diabetes mellitus     x 15 yrs  . GERD (gastroesophageal reflux disease)   . Back pain, chronic   . Pain in shoulder   . Pain in ankle   . Hiatal hernia   . HTN (hypertension)     all her life  . CKD (chronic kidney disease), stage III     GFR: 38  . Atrial fibrillation     since 1996  . Sleep apnea   . Fall 3 weeks ago    was evaluated at [facility]  . OSA on CPAP   . Depression   . PONV (postoperative nausea and vomiting)     difficult to wake up   Past Surgical History  Procedure Date  . Bunionectomy     bilateral  . Cholecystectomy   . Rotator cuff repair     2002  . Total knee arthroplasty     bilateral 2001  . Hammer toe surgery     2004  . Colonoscopy 07/11/2011    Procedure: COLONOSCOPY;  Surgeon: Malissa Hippo, MD;  [facility];  Service: Endoscopy;  Laterality: N/A;  3:00   Social History:  reports that she has never smoked. She has never used smokeless tobacco. She reports that she does not drink alcohol or use illicit drugs. [place] [facility] Can patient participate in ADLs? Not at this juncture  Allergies  Allergen Reactions  . Lasix (Furosemide) Other (See Comments)    Kidney failure   . Morphine Sulfate Other (See Comments)    REACTION: change in personality  . Percocet (Oxycodone-Acetaminophen) Other (See Comments)  Does not relieve the pain  . Remeron (Mirtazapine) Other (See Comments)    Altered mental status, lethargy  . Tuna (Fish Allergy)   . Penicillins Rash    Family History  Problem Relation Age of Onset  . Colon cancer Brother   Diabetes, HTN  Prior to Admission medications   Medication Sig Start Date End Date Taking? Authorizing Provider  allopurinol (ZYLOPRIM) 300 MG tablet Take 150 mg by mouth daily.   Yes Historical Provider, MD  diltiazem (CARDIZEM) 60 MG tablet Take 60 mg by mouth 4 (four) times daily.   Yes Historical Provider, MD  docusate sodium (COLACE) 100  MG capsule Take 100 mg by mouth 2 (two) times daily as needed. constipation   Yes Historical Provider, MD  gabapentin (NEURONTIN) 300 MG capsule Take 300 mg by mouth 3 (three) times daily.    Yes Historical Provider, MD  hydrALAZINE (APRESOLINE) 50 MG tablet Take 50 mg by mouth 3 (three) times daily.   Yes Historical Provider, MD  HYDROcodone-acetaminophen (NORCO/VICODIN) 5-325 MG per tablet Take 2 tablets by mouth every 6 (six) hours as needed. pain   Yes Historical Provider, MD  insulin aspart (NOVOLOG) 100 UNIT/ML injection Inject 5 Units into the skin 3 (three) times daily before meals. cbg>150   Yes Historical Provider, MD  insulin glargine (LANTUS) 100 UNIT/ML injection Inject 5 Units into the skin at bedtime.   Yes Historical Provider, MD  iron polysaccharides (NIFEREX) 150 MG capsule Take 150 mg by mouth daily.   Yes Historical Provider, MD  isosorbide dinitrate (ISORDIL) 20 MG tablet Take 20 mg by mouth 3 (three) times daily.   Yes Historical Provider, MD  pantoprazole (PROTONIX) 40 MG tablet Take 40 mg by mouth daily.   Yes Historical Provider, MD  polyethylene glycol (MIRALAX / GLYCOLAX) packet Take 17 g by mouth daily.   Yes Historical Provider, MD  sotalol (BETAPACE) 80 MG tablet Take 80 mg by mouth 2 (two) times daily.   Yes Historical Provider, MD  torsemide (DEMADEX) 5 MG tablet Take 5 mg by mouth daily.   Yes Historical Provider, MD  warfarin (COUMADIN) 2.5 MG tablet Take 2.5 mg by mouth daily. Take with 3 mg   Yes Historical Provider, MD  warfarin (COUMADIN) 3 MG tablet Take 3 mg by mouth daily. Take with 2.5 mg   Yes Historical Provider, MD   Physical Exam: Filed Vitals:   08/27/12 1516  BP: 149/96  Pulse: 118  Temp: 98 F (36.7 C)  TempSrc: Oral  Resp: 16  SpO2: 95%     General:  Pt in NAD, somnolent but arrousable Oriented x 1 to person (not place, time, or president)  Eyes: EOMI, non icteric  ENT: normal exterior appearance, no masses on visual  inspection  Neck: supple, no goiter on visual inspection  Cardiovascular: N S1 and S2, no murmurs, LE edema, + distal pulses  Respiratory: Decreased breath sounds at lung bases, no wheezes  Abdomen: soft, NT, ND  Skin:  Warm and dry  Musculoskeletal: No cyanosis or clubbing  Psychiatric: unable to properly assess due to somnolence  Neurologic: somnolent but arrousable, unable to properly assess due to somnolence, moves extremities  Labs on Admission:  Basic Metabolic Panel:  Lab 08/27/12 1610 08/25/12 0455 08/24/12 1612 08/24/12 0415 08/23/12 1504  NA 133* 135 133* 130* 130*  K 5.1 4.8 4.8 5.3* 5.3*  CL 93* 93* 92* 91* 90*  CO2 37* 37* 37* 34* 34*  GLUCOSE 214* 158* 174* 170*  185*  BUN 46* 50* 59* 61* 60*  CREATININE 1.72* 1.79* 2.04* 2.18* 2.18*  CALCIUM 9.0 9.2 9.2 9.1 9.2  MG -- -- -- -- --  PHOS -- -- -- -- --   Liver Function Tests:  Lab 08/23/12 1216  AST 21  ALT 19  ALKPHOS 136*  BILITOT 0.4  PROT 7.6  ALBUMIN 3.8   No results found for this basename: LIPASE:5,AMYLASE:5 in the last 168 hours No results found for this basename: AMMONIA:5 in the last 168 hours CBC:  Lab 08/27/12 1650 08/25/12 0455 08/24/12 0415 08/23/12 1216  WBC 7.9 6.3 6.9 7.2  NEUTROABS 6.2 -- -- 5.7  HGB 7.9* 9.8* 9.2* 9.8*  HCT 25.5* 32.2* 29.5* 31.7*  MCV 85.0 85.2 84.0 83.4  PLT 154 187 190 220   Cardiac Enzymes:  Lab 08/23/12 1217  CKTOTAL --  CKMB --  CKMBINDEX --  TROPONINI <0.30    BNP (last 3 results) No results found for this basename: PROBNP:3 in the last 8760 hours CBG:  Lab 08/25/12 1134 08/25/12 0757 08/24/12 2134 08/24/12 1658 08/24/12 1156  GLUCAP 148* 146* 208* 151* 163*    Radiological Exams on Admission: Dg Chest 1 View  08/27/2012  *RADIOLOGY REPORT*  Clinical Data: Post fall, history of atrial fibrillation  CHEST - 1 VIEW  Comparison: 07/26/2012; 07/25/2012; 07/18/2012  Findings:  Grossly unchanged enlarged cardiac silhouette and mediastinal  contours.  Interval development of a moderate-sized right-sided pleural effusion.  Pulmonary vasculature remains indistinct with cephalization of flow.  No definite left-sided pleural effusion. No definite pneumothorax.  Calcification overlying the left lower neck may be within the left lobe of the thyroid.  No definite acute osseous abnormalities.  IMPRESSION: 1.  Interval development of a moderate-sized right-sided pleural effusion with persistent findings of mild pulmonary edema.  Further evaluation with a PA and lateral chest radiograph may be obtained as clinically indicated. 2.  Possible calcification within the left lobe of the thyroid. Further evaluation with thyroid ultrasound may be performed in the non emergent setting as clinically indicated   Original Report Authenticated By: Waynard Reeds, M.D.    Dg Femur Right  08/27/2012  *RADIOLOGY REPORT*  Clinical Data: Post fall, now with severe pain and tenderness involving the distal aspect of the femur  RIGHT FEMUR - 2 VIEW  Comparison: None.  Findings:  There is an oblique, minimally displaced, slightly impacted fracture of the distal femoral metaphysis.  There is no definitive extension of the fracture into the right total knee replacement hardware.  No definite evidence of hardware failure or loosening. No definite suprapatellar joint effusion, though evaluation degraded due to technique.  No radiopaque foreign body.  IMPRESSION: Oblique, minimally displaced, slightly impacted fracture of the distal femoral metaphysis without definite extension to involve the femoral component of the right total knee replacement.   Original Report Authenticated By: Waynard Reeds, M.D.    Dg Tibia/fibula Left  08/27/2012  *RADIOLOGY REPORT*  Clinical Data: Post fall, now with leg pain  LEFT TIBIA AND FIBULA - 2 VIEW  Comparison: None.  Findings: There is an oblique, displaced fracture of the proximal tibial metaphysis with apparent extension to involve the  tibial component of the left total knee hardware, which appears minimally angulated, apex ventral.  This is the adjacent soft tissue swelling.  No radiopaque foreign body.  Vascular calcifications.  IMPRESSION: Oblique, displaced fracture of the proximal tibial metaphysis with extension to involve the tibial component of the left total  knee replacement hardware.   Original Report Authenticated By: Waynard Reeds, M.D.     EKG: Independently reviewed. Narrow complex tachycardia at rate of 102, No ST elevation or depressions  Assessment/Plan Active Problems:  Multiple fractures of both lower extremities Atrial fibrillation Hyponatremia COPD HTN GERD OSA DM   1. Multiple fractures of both lower extremities -Orthopaedic surgeons consulted by ED physician.  On board and will manage - Given patient's age comorbidities and heart history (although no MI reportedly within the last 5 years) patient cleared medically as intermediate-high risk. Family understands that for procedure patient will need to be full code and agree to place her as full code.  Also they understand that patient is at higher risk of complications related to surgical procedure given her age and multiple comorbidities and they would like to proceed and further discuss surgical treatment options and other possible complications with surgeon. - Inpatient status given current scenario and treatment options - npo except sips with meds - neurovascular checks  2. Atrial fibrillation - While on monitor patient had what appeared to be p waves on monitor patient has history of PAF per discussion with daughters. - Although EKG did not show any p waves.  At this juncture will hold Coumadin therapy.  Would appreciate from surgical standpoint if orthopaedic surgeon would indicate his preference on when to start coumadin therapy again. (up todate medication information suggests 12-24 hours after procedure, but nonetheless will defer to surgeon  based on his expertise and preference.) - Continue home regimen Cardizem and sotalol - telemetry monitoring - heart rate has ranged from 102-118 on monitor.  3. Hyponatremia - Based on x ray findings and physical exam, suspect that this is 2ary to hypervolemic hyponatremia from diastolic HF (daughter reports history of diastolic heart failure although in our records echo on 08/24/12 was reported as not being technically sufficient to allow evaluation of LV diastolic function and prior echo reports diastolic filling pattern consistent with pseudonomalization. - Will avoid at this juncture normal saline and saline lock patient at this point given edema and suspected history of CHF - Continue torsemide administration at this juncture - monitor creatinine levels  4. COPD - no wheezes on exam - X ray reports: Interval development of a moderate-sized right-sided pleural effusion with persistent findings of mild pulmonary edema. As such will hold off on IVF hydration and saline lock.   -Place on albuterol prn  - supplemental O2 to keep SO2 between 92-96 %  5. HTN - At this point will hold hydralazine - Continue diltiazem - hold Isordil tonight and start tomorrow if blood pressures elevated. Holding parameters placed pending blood pressures  6. DM - SSI while patient in house - continue lantus - NPO at this juncture given fractures. - Once patient able to eat would recommend diabetic diet - CBG's  7. GERD - protonix IV at this juncture.  8. OSA - Consult respiratory therapy for help with placing settings for CPAP per home settings - Cpap at night.  Addendum:  CKD - last values 2.0 on last admission and recently patient's creatinine 1.7.  Thus seems to be at baseline - Continue to monitor creatinine - Stage 3 based on GFR although patient without proteinuria in U/A   Code Status: Full at this juncture per discussion with daughters Family Communication: Spoke with both daughters  present Disposition Plan: Pending hospital course and disposition from surgeon's standpoint Time spent: > 60 minutes  Gina Ashley [facility] Pager [phone]  If 7PM-7AM, please contact night-coverage [email or web address] Password [email or web address] 08/27/2012, 7:08 PM

## 2012-08-27 NOTE — ED Notes (Signed)
Attempted to wake pt up to administer Cardizem ,however pt is very lethargic and only responds to sternal rub with opening her eyes. Dr Adriana Simas notified and order for Narcan received.

## 2012-08-27 NOTE — ED Notes (Signed)
Pt here from [facility], per daughter pt fell at 0400 this am, it was un witnessed fall, pt sts her pain is tolerable if she doesn't move. Per daughter pt was at the [facility] due to mobility issues. Pt was given 250 mcg Fentanyl intranasaly en route.

## 2012-08-27 NOTE — ED Notes (Addendum)
Per EMS pt in from [facility]. Pt fell at 0400am today. Had xrays completed at facility and has transverse fx of left tibia and oblique fx of right distal femur. EMS gave of fentanyl given Intranasally.

## 2012-08-27 NOTE — Consult Note (Signed)
Reason for Consult:  bilat knee pain Referring Physician: Dr. Gildardo Pounds Knobloch is an 78 y.o. female.  HPI: 78 y/o female with PMH significant for diabetes, afib (on coumadin) and renal failure.  She fell early this morning at [facility].  She c/o pain in both knees that is severe and sharp.  Pain is worse with movement and better with rest.  She has recently been admitted at [facility] for renal failure and was then transferred to [facility].  Only recently discharged to [facility].  She is accompanied by her daughter.   Past Medical History  Diagnosis Date  . Diabetes mellitus     x 15 yrs  . GERD (gastroesophageal reflux disease)   . Back pain, chronic   . Pain in shoulder   . Pain in ankle   . Hiatal hernia   . HTN (hypertension)     all her life  . CKD (chronic kidney disease), stage III     GFR: 38  . Atrial fibrillation     since 1996  . Sleep apnea   . Fall 3 weeks ago    was evaluated at [facility]  . OSA on CPAP   . Depression   . PONV (postoperative nausea and vomiting)     difficult to wake up    Past Surgical History  Procedure Date  . Bunionectomy     bilateral  . Cholecystectomy   . Rotator cuff repair     2002  . Total knee arthroplasty     bilateral 2001  . Hammer toe surgery     2004  . Colonoscopy 07/11/2011    Procedure: COLONOSCOPY;  Surgeon: Malissa Hippo, MD;  Location[facility];  Service: Endoscopy;  Laterality: N/A;  3:00    Family History  Problem Relation Age of Onset  . Colon cancer Brother     Social History:  reports that she has never smoked. She has never used smokeless tobacco. She reports that she does not drink alcohol or use illicit drugs.  Allergies:  Allergies  Allergen Reactions  . Lasix (Furosemide) Other (See Comments)    Kidney failure   . Morphine Sulfate Other (See Comments)    REACTION: change in personality  . Percocet (Oxycodone-Acetaminophen) Other (See Comments)    Does not relieve the pain  .  Remeron (Mirtazapine) Other (See Comments)    Altered mental status, lethargy  . Tuna (Fish Allergy)   . Penicillins Rash    Medications: I have reviewed the patient's current medications.  Results for orders placed during the hospital encounter of 08/27/12 (from the past 48 hour(s))  URINALYSIS, MICROSCOPIC ONLY     Status: Normal   Collection Time   08/27/12  4:04 PM      Component Value Range Comment   Color, Urine YELLOW  YELLOW    APPearance CLEAR  CLEAR    Specific Gravity, Urine 1.015  1.005 - 1.030    pH 5.0  5.0 - 8.0    Glucose, UA NEGATIVE  NEGATIVE mg/dL    Hgb urine dipstick NEGATIVE  NEGATIVE    Bilirubin Urine NEGATIVE  NEGATIVE    Ketones, ur NEGATIVE  NEGATIVE mg/dL    Protein, ur NEGATIVE  NEGATIVE mg/dL    Urobilinogen, UA 0.2  0.0 - 1.0 mg/dL    Nitrite NEGATIVE  NEGATIVE    Leukocytes, UA NEGATIVE  NEGATIVE    Squamous Epithelial / LPF RARE  RARE    Urine-Other  FEW YEAST     CBC WITH DIFFERENTIAL     Status: Abnormal   Collection Time   08/27/12  4:50 PM      Component Value Range Comment   WBC 7.9  4.0 - 10.5 K/uL    RBC 3.00 (*) 3.87 - 5.11 MIL/uL    Hemoglobin 7.9 (*) 12.0 - 15.0 g/dL    HCT 40.9 (*) 81.1 - 46.0 %    MCV 85.0  78.0 - 100.0 fL    MCH 26.3  26.0 - 34.0 pg    MCHC 31.0  30.0 - 36.0 g/dL    RDW 91.4 (*) 78.2 - 15.5 %    Platelets 154  150 - 400 K/uL    Neutrophils Relative 79 (*) 43 - 77 %    Neutro Abs 6.2  1.7 - 7.7 K/uL    Lymphocytes Relative 10 (*) 12 - 46 %    Lymphs Abs 0.8  0.7 - 4.0 K/uL    Monocytes Relative 8  3 - 12 %    Monocytes Absolute 0.6  0.1 - 1.0 K/uL    Eosinophils Relative 3  0 - 5 %    Eosinophils Absolute 0.2  0.0 - 0.7 K/uL    Basophils Relative 0  0 - 1 %    Basophils Absolute 0.0  0.0 - 0.1 K/uL   BASIC METABOLIC PANEL     Status: Abnormal   Collection Time   08/27/12  4:50 PM      Component Value Range Comment   Sodium 133 (*) 135 - 145 mEq/L    Potassium 5.1  3.5 - 5.1 mEq/L    Chloride 93 (*) 96  - 112 mEq/L    CO2 37 (*) 19 - 32 mEq/L    Glucose, Bld 214 (*) 70 - 99 mg/dL    BUN 46 (*) 6 - 23 mg/dL    Creatinine, Ser 9.56 (*) 0.50 - 1.10 mg/dL    Calcium 9.0  8.4 - 21.3 mg/dL    GFR calc non Af Amer 26 (*) >90 mL/min    GFR calc Af Amer 30 (*) >90 mL/min   PROTIME-INR     Status: Abnormal   Collection Time   08/27/12  4:50 PM      Component Value Range Comment   Prothrombin Time 29.0 (*) 11.6 - 15.2 seconds    INR 2.92 (*) 0.00 - 1.49     Dg Chest 1 View  08/27/2012  *RADIOLOGY REPORT*  Clinical Data: Post fall, history of atrial fibrillation  CHEST - 1 VIEW  Comparison: 07/26/2012; 07/25/2012; 07/18/2012  Findings:  Grossly unchanged enlarged cardiac silhouette and mediastinal contours.  Interval development of a moderate-sized right-sided pleural effusion.  Pulmonary vasculature remains indistinct with cephalization of flow.  No definite left-sided pleural effusion. No definite pneumothorax.  Calcification overlying the left lower neck may be within the left lobe of the thyroid.  No definite acute osseous abnormalities.  IMPRESSION: 1.  Interval development of a moderate-sized right-sided pleural effusion with persistent findings of mild pulmonary edema.  Further evaluation with a PA and lateral chest radiograph may be obtained as clinically indicated. 2.  Possible calcification within the left lobe of the thyroid. Further evaluation with thyroid ultrasound may be performed in the non emergent setting as clinically indicated   Original Report Authenticated By: Waynard Reeds, M.D.    Dg Femur Right  08/27/2012  *RADIOLOGY REPORT*  Clinical Data: Post fall, now with severe pain  and tenderness involving the distal aspect of the femur  RIGHT FEMUR - 2 VIEW  Comparison: None.  Findings:  There is an oblique, minimally displaced, slightly impacted fracture of the distal femoral metaphysis.  There is no definitive extension of the fracture into the right total knee replacement hardware.   No definite evidence of hardware failure or loosening. No definite suprapatellar joint effusion, though evaluation degraded due to technique.  No radiopaque foreign body.  IMPRESSION: Oblique, minimally displaced, slightly impacted fracture of the distal femoral metaphysis without definite extension to involve the femoral component of the right total knee replacement.   Original Report Authenticated By: Waynard Reeds, M.D.    Dg Tibia/fibula Left  08/27/2012  *RADIOLOGY REPORT*  Clinical Data: Post fall, now with leg pain  LEFT TIBIA AND FIBULA - 2 VIEW  Comparison: None.  Findings: There is an oblique, displaced fracture of the proximal tibial metaphysis with apparent extension to involve the tibial component of the left total knee hardware, which appears minimally angulated, apex ventral.  This is the adjacent soft tissue swelling.  No radiopaque foreign body.  Vascular calcifications.  IMPRESSION: Oblique, displaced fracture of the proximal tibial metaphysis with extension to involve the tibial component of the left total knee replacement hardware.   Original Report Authenticated By: Waynard Reeds, M.D.     ROS  No recent f/c/n/v/wt loss. Blood pressure 126/75, pulse 117, temperature 97.5 F (36.4 C), temperature source Axillary, resp. rate 11, SpO2 95.00%. Physical Exam wn wd woman in nad.  A and O x 4.  Mood and affect normal.  EOMI.  Respirations unlabored.  B LEs with healthy skin.  No lymphadenopathy.  Active pf and df of ankles.  Swelling at both knees.  2+ dp and pt pulses.  Feels LT at both ankles.  Assessment/Plan: R periprosthetic supracondylar femur fracture and left periprosthetic proximal tibia fracture.  These are severe injuries in what appears to be osteoporotic bone in a patient with multiple medical comorbidities.  They represent a high risk of complications regardless of the treatment approach.  For tonight we'll immobilize the LEs in knee immobilizers and bulky Jones  dressings.  I'll talk with Dr. Despina Hick or Dr. Charlann Boxer about the patient to arrange further care.  I recommend holding her coumadin in anticipation of surgical treatment likely next week.  Continue NWB on B LEs.  Foot pumps for DVT prophylaxis.  The patient's daughter understands the plan and agrees.  Toni Arthurs 08/27/2012, 7:58 PM

## 2012-08-27 NOTE — Progress Notes (Signed)
Clinical Social Work Department BRIEF PSYCHOSOCIAL ASSESSMENT 08/27/2012  Patient:  [facility]     Account Number:  [id number]     Admit date:  08/27/2012  Clinical Social Worker:  Doree Albee  Date/Time:  08/27/2012 05:05 PM  Referred by:  RN  Date Referred:  08/27/2012 Referred for  SNF Placement   Other Referral:   Interview type:  Family Other interview type:   patient unable to engage, as pt sleeping heavily from pain medication    PSYCHOSOCIAL DATA Living Status:  FACILITY Admitted from facility:  [facility] Level of care:  Skilled Nursing Facility Primary support name:  Quentin Angst Primary support relationship to patient:  CHILD, ADULT Degree of support available:   strong    CURRENT CONCERNS Current Concerns  Post-Acute Placement   Other Concerns:    SOCIAL WORK ASSESSMENT / PLAN CSW met with pt and patient daughter at bedside to complete pt psychosocial assessment and assist with pt dc plans when medically stable.    Pt currently sleeping and unable to arouse due to pain medication. Pt daughter at bedside confirmed that patient was a resident at [facility] for short term rehab. Pt daughter shared that pt would plan to return to [facility] when medically stable.    CSW will complete  fl2 and will place in pt shadow chart.   Assessment/plan status:  Psychosocial Support/Ongoing Assessment of Needs Other assessment/ plan:   and discharge planning   Information/referral to community resources:   none identified at this time    PATIENT'S/FAMILY'S RESPONSE TO PLAN OF CARE: Pt daughter thanked csw for concern and support. CSW will follow up with patient at later time to follow up with pt further csw needs.      Catha Gosselin, Theresia Majors  [phone] 08/27/2012 20:09

## 2012-08-27 NOTE — ED Notes (Signed)
Narcan given pt is more alert now.

## 2012-08-28 ENCOUNTER — Inpatient Hospital Stay (HOSPITAL_COMMUNITY): Payer: Medicare Other

## 2012-08-28 DIAGNOSIS — J90 Pleural effusion, not elsewhere classified: Secondary | ICD-10-CM

## 2012-08-28 DIAGNOSIS — R0689 Other abnormalities of breathing: Secondary | ICD-10-CM | POA: Diagnosis present

## 2012-08-28 DIAGNOSIS — J9600 Acute respiratory failure, unspecified whether with hypoxia or hypercapnia: Secondary | ICD-10-CM | POA: Diagnosis present

## 2012-08-28 LAB — BLOOD GAS, ARTERIAL
Acid-Base Excess: 7.1 mmol/L — ABNORMAL HIGH (ref 0.0–2.0)
Acid-Base Excess: 9.7 mmol/L — ABNORMAL HIGH (ref 0.0–2.0)
Bicarbonate: 34.8 mEq/L — ABNORMAL HIGH (ref 20.0–24.0)
Bicarbonate: 35.4 mEq/L — ABNORMAL HIGH (ref 20.0–24.0)
Drawn by: 129801
Expiratory PAP: 5
Inspiratory PAP: 20
O2 Saturation: 95.3 %
O2 Saturation: 99.8 %
pCO2 arterial: 60.5 mmHg (ref 35.0–45.0)
pO2, Arterial: 77.1 mmHg — ABNORMAL LOW (ref 80.0–100.0)
pO2, Arterial: 141 mmHg — ABNORMAL HIGH (ref 80.0–100.0)

## 2012-08-28 LAB — BASIC METABOLIC PANEL
BUN: 51 mg/dL — ABNORMAL HIGH (ref 6–23)
Calcium: 9.1 mg/dL (ref 8.4–10.5)
Calcium: 9.1 mg/dL (ref 8.4–10.5)
GFR calc Af Amer: 25 mL/min — ABNORMAL LOW (ref >90)
GFR calc Af Amer: 23 mL/min — ABNORMAL LOW (ref >90)
GFR calc non Af Amer: 22 mL/min — ABNORMAL LOW (ref >90)
GFR calc non Af Amer: 19 mL/min — ABNORMAL LOW (ref >90)
Glucose, Bld: 178 mg/dL — ABNORMAL HIGH (ref 70–99)
Glucose, Bld: 209 mg/dL — ABNORMAL HIGH (ref 70–99)
Potassium: 5.5 mEq/L — ABNORMAL HIGH (ref 3.5–5.1)
Sodium: 132 mEq/L — ABNORMAL LOW (ref 135–145)
Sodium: 135 mEq/L (ref 135–145)

## 2012-08-28 LAB — CBC
MCH: 26 pg (ref 26.0–34.0)
MCHC: 30.2 g/dL (ref 30.0–36.0)
Platelets: 121 10*3/uL — ABNORMAL LOW (ref 150–400)
RBC: 2.88 MIL/uL — ABNORMAL LOW (ref 3.87–5.11)

## 2012-08-28 LAB — TSH: TSH: 1.492 u[IU]/mL (ref 0.350–4.500)

## 2012-08-28 LAB — PROTIME-INR: INR: 3.23 — ABNORMAL HIGH (ref 0.00–1.49)

## 2012-08-28 LAB — URINE CULTURE: Culture: NO GROWTH

## 2012-08-28 LAB — HEMOGLOBIN A1C: Mean Plasma Glucose: 146 mg/dL — ABNORMAL HIGH (ref <117)

## 2012-08-28 LAB — GLUCOSE, CAPILLARY: Glucose-Capillary: 182 mg/dL — ABNORMAL HIGH (ref 70–99)

## 2012-08-28 LAB — PREPARE RBC (CROSSMATCH)

## 2012-08-28 LAB — ABO/RH: ABO/RH(D): A POS

## 2012-08-28 MED ORDER — FUROSEMIDE 10 MG/ML IJ SOLN
60.0000 mg | Freq: Two times a day (BID) | INTRAMUSCULAR | Status: DC
  Filled 2012-08-28 (×3): qty 6

## 2012-08-28 MED ORDER — FENTANYL CITRATE 0.05 MG/ML IJ SOLN
INTRAMUSCULAR | Status: AC
  Filled 2012-08-28: qty 2

## 2012-08-28 MED ORDER — HYDROCODONE-ACETAMINOPHEN 5-325 MG PO TABS
1.0000 | ORAL_TABLET | Freq: Four times a day (QID) | ORAL | Status: DC | PRN
  Administered 2012-08-28 – 2012-08-29 (×3): 1 via ORAL
  Filled 2012-08-28 (×3): qty 1

## 2012-08-28 MED ORDER — LEVALBUTEROL HCL 0.63 MG/3ML IN NEBU
0.6300 mg | INHALATION_SOLUTION | Freq: Four times a day (QID) | RESPIRATORY_TRACT | Status: DC | PRN
  Administered 2012-09-07: 0.63 mg via RESPIRATORY_TRACT
  Filled 2012-08-28 (×2): qty 3

## 2012-08-28 MED ORDER — CHLORHEXIDINE GLUCONATE 0.12 % MT SOLN
15.0000 mL | Freq: Two times a day (BID) | OROMUCOSAL | Status: DC
  Administered 2012-08-28 – 2012-09-14 (×33): 15 mL via OROMUCOSAL
  Filled 2012-08-28 (×38): qty 15

## 2012-08-28 MED ORDER — SODIUM POLYSTYRENE SULFONATE 15 GM/60ML PO SUSP
30.0000 g | Freq: Once | ORAL | Status: AC
  Administered 2012-08-28: 30 g via ORAL
  Filled 2012-08-28: qty 120

## 2012-08-28 MED ORDER — ENSURE COMPLETE PO LIQD: 237.0000 mL | Freq: Two times a day (BID) | ORAL | Status: DC | PRN

## 2012-08-28 MED ORDER — SODIUM CHLORIDE 0.9 % IJ SOLN
10.0000 mL | Freq: Two times a day (BID) | INTRAMUSCULAR | Status: DC
  Administered 2012-08-28 – 2012-08-29 (×3): 10 mL
  Administered 2012-08-30: 20 mL
  Administered 2012-08-30 – 2012-09-05 (×12): 10 mL
  Administered 2012-09-05: 20 mL
  Administered 2012-09-06 – 2012-09-07 (×3): 10 mL
  Administered 2012-09-08: 20 mL
  Administered 2012-09-08 – 2012-09-15 (×6): 10 mL

## 2012-08-28 MED ORDER — LEVALBUTEROL HCL 0.63 MG/3ML IN NEBU
0.6300 mg | INHALATION_SOLUTION | Freq: Four times a day (QID) | RESPIRATORY_TRACT | Status: DC
  Administered 2012-08-28 – 2012-08-30 (×10): 0.63 mg via RESPIRATORY_TRACT
  Filled 2012-08-28 (×14): qty 3

## 2012-08-28 MED ORDER — FUROSEMIDE 10 MG/ML IJ SOLN
40.0000 mg | Freq: Once | INTRAMUSCULAR | Status: AC
  Filled 2012-08-28: qty 4

## 2012-08-28 MED ORDER — TORSEMIDE 20 MG/2ML IV SOLN
30.0000 mg | Freq: Two times a day (BID) | INTRAVENOUS | Status: DC
  Filled 2012-08-28: qty 3

## 2012-08-28 MED ORDER — BIOTENE DRY MOUTH MT LIQD
15.0000 mL | Freq: Two times a day (BID) | OROMUCOSAL | Status: DC
Start: 2012-08-28 — End: 2012-09-10
  Administered 2012-08-28 – 2012-09-09 (×25): 15 mL via OROMUCOSAL

## 2012-08-28 MED ORDER — SODIUM CHLORIDE 0.9 % IJ SOLN
10.0000 mL | INTRAMUSCULAR | Status: DC | PRN
  Administered 2012-09-11 – 2012-09-15 (×2): 10 mL

## 2012-08-28 MED ORDER — PRO-STAT SUGAR FREE PO LIQD
30.0000 mL | Freq: Three times a day (TID) | ORAL | Status: DC
  Administered 2012-08-29 – 2012-09-12 (×31): 30 mL via ORAL
  Filled 2012-08-28 (×47): qty 30

## 2012-08-28 MED ORDER — TORSEMIDE 20 MG PO TABS
30.0000 mg | ORAL_TABLET | Freq: Two times a day (BID) | ORAL | Status: DC
  Administered 2012-08-28: 30 mg via ORAL
  Filled 2012-08-28 (×3): qty 1

## 2012-08-28 MED ORDER — FENTANYL CITRATE 0.05 MG/ML IJ SOLN
75.0000 ug | INTRAMUSCULAR | Status: DC | PRN
  Administered 2012-08-28: 25 ug via INTRAVENOUS
  Administered 2012-08-28 – 2012-08-29 (×3): 75 ug via INTRAVENOUS
  Filled 2012-08-28 (×3): qty 2

## 2012-08-28 MED ORDER — FENTANYL CITRATE 0.05 MG/ML IJ SOLN
25.0000 ug | INTRAMUSCULAR | Status: DC | PRN
  Administered 2012-08-28: 25 ug via INTRAVENOUS

## 2012-08-28 MED ORDER — FENTANYL CITRATE 0.05 MG/ML IJ SOLN
50.0000 ug | INTRAMUSCULAR | Status: DC | PRN
  Administered 2012-08-28 (×3): 50 ug via INTRAVENOUS
  Filled 2012-08-28 (×2): qty 2

## 2012-08-28 NOTE — Progress Notes (Addendum)
Placed pt on nasal cpap for rest.  Home settings not known.  Pt given pain meds and unable to interview at this time.  Attempted autotitration mode with minimum pressure of 5cm h2o -20cm per protocol with 4l o2 ble[place], but pt did not tolerate well, sats low 80s.  Pt remains on autotitration mode, however minimum epap gradually increased to 15cm h2o with 6l o2 bleedin to get adequate sats per md order.  Pt tolerating well at this time.  HR 103, sats 93%. RN aware.

## 2012-08-28 NOTE — Progress Notes (Signed)
Pt has order for lasix between blood transfusions. Family did not want pt to have lasix, family reported that pt has a hx of bad kidney reaction to lasix. Family said pt physician said pt can only have Torsemide. NP triad floor coverage notified,new order received.

## 2012-08-28 NOTE — Progress Notes (Signed)
INITIAL ADULT NUTRITION ASSESSMENT Date: 08/28/2012   Time: 12:02 PM Reason for Assessment: Nutrition risk  ASSESSMENT: Female 78 y.o.  Dx: Multiple fractures of both lower extremities   Hx:  Past Medical History  Diagnosis Date  . Diabetes mellitus     x 15 yrs  . GERD (gastroesophageal reflux disease)   . Back pain, chronic   . Pain in shoulder   . Pain in ankle   . Hiatal hernia   . HTN (hypertension)     all her life  . CKD (chronic kidney disease), stage III     GFR: 38  . Atrial fibrillation     since 1996  . Sleep apnea   . Fall 3 weeks ago    was evaluated at [facility]  . OSA on CPAP   . Depression   . PONV (postoperative nausea and vomiting)     difficult to wake up   Past Surgical History  Procedure Date  . Bunionectomy     bilateral  . Cholecystectomy   . Rotator cuff repair     2002  . Total knee arthroplasty     bilateral 2001  . Hammer toe surgery     2004  . Colonoscopy 07/11/2011    Procedure: COLONOSCOPY;  Surgeon: Malissa Hippo, MD;  Location: [facility]ITE;  Service: Endoscopy;  Laterality: N/A;  3:00    Related Meds:     . diltiazem  60 mg Oral QID  . fentaNYL      . furosemide  40 mg Intravenous Once  . furosemide  60 mg Intravenous Q12H  . gabapentin  300 mg Oral TID  .  HYDROmorphone (DILAUDID) injection  0.5 mg Intravenous Once  . insulin aspart  0-15 Units Subcutaneous TID WC  . insulin glargine  5 Units Subcutaneous QHS  . isosorbide dinitrate  20 mg Oral TID  . levalbuterol  0.63 mg Nebulization Q6H  . naloxone (NARCAN) injection  0.4 mg Intravenous Once  . ondansetron (ZOFRAN) IV  4 mg Intravenous Once  . pantoprazole (PROTONIX) IV  40 mg Intravenous QHS  . sodium chloride  3 mL Intravenous Q12H  . sodium chloride  3 mL Intravenous Q12H  . sodium polystyrene  30 g Oral Once  . sotalol  80 mg Oral BID  . DISCONTD: torsemide  5 mg Oral Daily     Ht: 5\' 3"  (160 cm)  Wt: 253 lb 1.4 oz (114.8 kg)  Ideal Wt: 52.4 kg   % Ideal Wt: 219  Usual Wt:  Wt Readings from Last 10 Encounters:  08/28/12 253 lb 1.4 oz (114.8 kg)  08/25/12 241 lb 2.9 oz (109.4 kg)  06/16/12 221 lb 5.5 oz (100.4 kg)  07/11/11 208 lb (94.348 kg)  07/11/11 208 lb (94.348 kg)  06/14/11 212 lb 12.8 oz (96.525 kg)  06/03/11 208 lb 3.2 oz (94.439 kg)  04/17/11 215 lb (97.523 kg)  09/20/08 225 lb (102.059 kg)  08/19/08 226 lb 6.1 oz (102.686 kg)   % Usual Wt: 122  Body mass index is 44.83 kg/(m^2).- extreme obesity  Labs:  CMP     Component Value Date/Time   NA 132* 08/28/2012 0545   K 5.5* 08/28/2012 0545   CL 92* 08/28/2012 0545   CO2 35* 08/28/2012 0545   GLUCOSE 178* 08/28/2012 0545   BUN 50* 08/28/2012 0545   CREATININE 2.02* 08/28/2012 0545   CALCIUM 9.1 08/28/2012 0545   PROT 7.6 08/23/2012 1216   ALBUMIN 3.8  08/23/2012 1216   AST 21 08/23/2012 1216   ALT 19 08/23/2012 1216   ALKPHOS 136* 08/23/2012 1216   BILITOT 0.4 08/23/2012 1216   GFRNONAA 22* 08/28/2012 0545   GFRAA 25* 08/28/2012 0545    I/O last 3 completed shifts: In: 160 [I.V.:160] Out: 400 [Urine:400] Total I/O In: 10 [I.V.:10] Out: 125 [Urine:125]   Diet Order: Carb Control  Supplements/Tube Feeding:  none IVF:    Estimated Nutritional Needs:   Kcal: 1700-1800 kcal Protein: 100-110 gm protein Fluid: 1.7-1.8L  Food/Nutrition Related Hx: Pt with recent 27# weight loss secondary to fluid.  Discharged from [facility] to [facility] 2 days ago.  Readmitted s/p leg fx secondary to fall.  Now on bi-pap and unable to remove secondary to respiratory status.  Decreased intake for the past 3 months.  Was at [facility] and did not like the food there or at [facility].  Pt daughter reported she eats well when she like the food and with good intake last admit here.  Dislikes low sodium diets.  Needs now increased with fractures.  NUTRITION DIAGNOSIS: -Inadequate oral intake (NI-2.1).  Status: Ongoing  RELATED TO: decreased appetite, inability  to eat  AS EVIDENCE BY: bipap, report  MONITORING/EVALUATION(Goals): Pt status, intake, labs, weight Goal:  Improved respiratory status to allow pt to eat.  EDUCATION NEEDS: -No education needs identified at this time  INTERVENTION: Ensure Complete prn when pt not able to eat meals Prostat 30 ml tid (100 kcal, 15 gm protein each)  Dietitian #:[phone]  DOCUMENTATION CODES Per approved criteria  -Obesity Unspecified    Jeoffrey Massed 08/28/2012, 12:02 PM

## 2012-08-28 NOTE — Progress Notes (Signed)
Peripherally Inserted Central Catheter/Midline Placement  The IV Nurse has discussed with the patient and/or persons authorized to consent for the patient, the purpose of this procedure and the potential benefits and risks involved with this procedure.  The benefits include less needle sticks, lab draws from the catheter and patient may be discharged home with the catheter.  Risks include, but not limited to, infection, bleeding, blood clot (thrombus formation), and puncture of an artery; nerve damage and irregular heat beat.  Alternatives to this procedure were also discussed.  PICC/Midline Placement Documentation    Explained to daughter Gelene Mink .    Franne Grip Renee 08/28/2012, 2:31 PM

## 2012-08-28 NOTE — Progress Notes (Addendum)
PCP: Ernestine Conrad, MD  Brief HPI: Pt reportedly from [facility] place and had an unwitnessed fall around 4:00 am on day of admission. After the fall patient subsequently developed lower extremity discomfort and was subsequently taken to [facility] ED for further evaluation. History was obtained from daughters in the room and ED records as patient was given dilaudid for BL LE discomfort and subsequently was "sleepy" per family. In the room initially ortho technician was at bedside placing braces and patient had discomfort with manipulation of lower extremities while placing gauze.  Past medical history:  Past Medical History  Diagnosis Date  . Diabetes mellitus     x 15 yrs  . GERD (gastroesophageal reflux disease)   . Back pain, chronic   . Pain in shoulder   . Pain in ankle   . Hiatal hernia   . HTN (hypertension)     all her life  . CKD (chronic kidney disease), stage III     GFR: 38  . Atrial fibrillation     since 1996  . Sleep apnea   . Fall 3 weeks ago    was evaluated at [facility]  . OSA on CPAP   . Depression   . PONV (postoperative nausea and vomiting)     difficult to wake up    Consultants: Ortho (Dr. Victorino Dike)  Procedures: None yet  Subjective: Patient somnolent but arousable. Denies any pain or discomfort. Denies shortness of breath or chest pain.  Objective: Vital signs in last 24 hours: Temp:  [97.5 F (36.4 C)-98.1 F (36.7 C)] 98.1 F (36.7 C) (10/25 0429) Pulse Rate:  [112-118] 112  (10/25 0429) Resp:  [11-18] 18  (10/25 0429) BP: (119-149)/(72-96) 119/72 mmHg (10/25 0429) SpO2:  [92 %-95 %] 94 % (10/25 0429) Weight:  [113 kg (249 lb 1.9 oz)] 113 kg (249 lb 1.9 oz) (10/24 2215) Weight change:  Last BM Date: 08/24/12  Intake/Output from previous day: 10/24 0701 - 10/25 0700 In: 160 [I.V.:160] Out: 400 [Urine:400] Intake/Output this shift:    General appearance: cooperative, appears stated age, no distress, moderately obese and somnolent Head:  Normocephalic, without obvious abnormality, atraumatic Eyes: conjunctivae/corneas clear. PERRL, EOM's intact.  Resp: decreased air entry at the bases. No definite crackles or wheezing Cardio: regular rate and rhythm, S1, S2 normal, no murmur, click, rub or gallop GI: soft, non-tender; bowel sounds normal; no masses,  no organomegaly Extremities: covered with braces. Edema is noted b/l LE. no erythema Pulses: good radial pulses, but poor pedal pulses Neurologic: Somnolent but arousable. Was able to tell me she was in a hospital. Knew the month. No focal deficits. Some tremors noted.  Lab Results:  Basename 08/28/12 0545 08/27/12 1650  WBC 8.6 7.9  HGB 7.5* 7.9*  HCT 24.8* 25.5*  PLT 121* 154   BMET  Basename 08/28/12 0545 08/27/12 1650  NA 132* 133*  K 5.5* 5.1  CL 92* 93*  CO2 35* 37*  GLUCOSE 178* 214*  BUN 50* 46*  CREATININE 2.02* 1.72*  CALCIUM 9.1 9.0  ALT -- --    Studies/Results: Dg Chest 1 View  08/27/2012  *RADIOLOGY REPORT*  Clinical Data: Post fall, history of atrial fibrillation  CHEST - 1 VIEW  Comparison: 07/26/2012; 07/25/2012; 07/18/2012  Findings:  Grossly unchanged enlarged cardiac silhouette and mediastinal contours.  Interval development of a moderate-sized right-sided pleural effusion.  Pulmonary vasculature remains indistinct with cephalization of flow.  No definite left-sided pleural effusion. No definite pneumothorax.  Calcification overlying the  left lower neck may be within the left lobe of the thyroid.  No definite acute osseous abnormalities.  IMPRESSION: 1.  Interval development of a moderate-sized right-sided pleural effusion with persistent findings of mild pulmonary edema.  Further evaluation with a PA and lateral chest radiograph may be obtained as clinically indicated. 2.  Possible calcification within the left lobe of the thyroid. Further evaluation with thyroid ultrasound may be performed in the non emergent setting as clinically indicated    Original Report Authenticated By: Waynard Reeds, M.D.    Dg Femur Right  08/27/2012  *RADIOLOGY REPORT*  Clinical Data: Post fall, now with severe pain and tenderness involving the distal aspect of the femur  RIGHT FEMUR - 2 VIEW  Comparison: None.  Findings:  There is an oblique, minimally displaced, slightly impacted fracture of the distal femoral metaphysis.  There is no definitive extension of the fracture into the right total knee replacement hardware.  No definite evidence of hardware failure or loosening. No definite suprapatellar joint effusion, though evaluation degraded due to technique.  No radiopaque foreign body.  IMPRESSION: Oblique, minimally displaced, slightly impacted fracture of the distal femoral metaphysis without definite extension to involve the femoral component of the right total knee replacement.   Original Report Authenticated By: Waynard Reeds, M.D.    Dg Tibia/fibula Left  08/27/2012  *RADIOLOGY REPORT*  Clinical Data: Post fall, now with leg pain  LEFT TIBIA AND FIBULA - 2 VIEW  Comparison: None.  Findings: There is an oblique, displaced fracture of the proximal tibial metaphysis with apparent extension to involve the tibial component of the left total knee hardware, which appears minimally angulated, apex ventral.  This is the adjacent soft tissue swelling.  No radiopaque foreign body.  Vascular calcifications.  IMPRESSION: Oblique, displaced fracture of the proximal tibial metaphysis with extension to involve the tibial component of the left total knee replacement hardware.   Original Report Authenticated By: Waynard Reeds, M.D.     Medications:  Scheduled:   . diltiazem  60 mg Oral QID  . gabapentin  300 mg Oral TID  .  HYDROmorphone (DILAUDID) injection  0.5 mg Intravenous Once  . insulin aspart  0-15 Units Subcutaneous TID WC  . insulin glargine  5 Units Subcutaneous QHS  . isosorbide dinitrate  20 mg Oral TID  . naloxone (NARCAN) injection  0.4 mg  Intravenous Once  . ondansetron (ZOFRAN) IV  4 mg Intravenous Once  . pantoprazole (PROTONIX) IV  40 mg Intravenous QHS  . sodium chloride  3 mL Intravenous Q12H  . sodium chloride  3 mL Intravenous Q12H  . sodium polystyrene  30 g Oral Once  . sotalol  80 mg Oral BID  . torsemide  5 mg Oral Daily   Continuous:  ZOX:WRUEAV chloride, docusate sodium, HYDROcodone-acetaminophen, HYDROmorphone (DILAUDID) injection, levalbuterol, ondansetron (ZOFRAN) IV, ondansetron, sodium chloride, DISCONTD: albuterol  Assessment/Plan:  Principal Problem:  *Multiple fractures of both lower extremities Active Problems:  DM  OBSTRUCTIVE SLEEP APNEA  GERD  Atrial fibrillation  HTN (hypertension)  COPD (chronic obstructive pulmonary disease)  Hyponatremia  CKD (chronic kidney disease)   Multiple fractures of both lower extremities Seen by Ortho. They will discuss and if surgery is planned, it will be sometime next week. Pain control. Bedrest. Foley.  Somnolence Possibly from OSA and narcotics. She was not compliant with CPAP. Will get ABG to rule out CO2 narcosis due to tremors. Be cautious with narcotics.  Pleural Effusion/Fluid  Overload Had normal systolic EF on recent ECHO. Has normal WBC. Infectious process is less likely. Will start IV diuretics. There was reported 'allergy' to Lasix in Oct as causing 'kidney failure'. This is not an allergy. Will monitor renal function closely while on diuretics.  History of Atrial fibrillation  Rate is slightly high this morning. Continue Diltiazem and monitor closely. May need to adjust medications. Holding warfarin in anticipation of surgery. Due to recent TIA will like her back on anti-coagulation as soon as possible.  Hyponatremia  Based on x ray findings and physical exam, suspect that this is secondary to hypervolemic hyponatremia from diastolic HF. Should improve with diuresis.  Anemia Likely secondary to chronic disease. May need to be transfused as  anemia could be making her CHF worse.  History of COPD and OSA Stable. CPAP QHS. Check ABG to rule out CO2 narcosis.  History of HTN  Continue to monitor BP. Holding hydralazine. Continue other meds.   DM 2 SSI while patient in house. Continue lantus. Since surgery is not anticipated till next week, will initiate diet.   GERD  Protonix IV  CKD Stage 3 with Hyperkalemia Baseline creatinine between 1.7-1.9. Give kayexalate for elevated K. Repeat BMet later today.   Code Status Full Code  Family Communication No family at bedside  Disposition Plan Will likely need to go back to SNF when ready.   DVT Prophylaxis Therapeutic INR   LOS: 1 day   [facility]  [facility] Pager [phone] 08/28/2012, 8:04 AM   ABG: 7.26/79.8/77.1/95  The above presentation is likely due to combination of COPD/Narcotics/OSA. She will benefit from Bipap. She will be transferred to stepdown unit for closer monitoring and bipap.  Jaedynn Bohlken 9:11 AM

## 2012-08-28 NOTE — Progress Notes (Signed)
Updated [facility] and they indicate patient is ok to return there at d/c- her daughter is a Engineer, civil (consulting) there. Patient moved to ICU unit- will give report to CSW covering that unit and CSW will continue to follow for d/c planning and further needs as identified. Reece Levy, MSW, Theresia Majors [phone]

## 2012-08-28 NOTE — Progress Notes (Signed)
Pt awake, removes CPAP, states "I don't want it anymore".  Replaced O2 at 3L/min [place], Sats maintaining at 93-94%.  Continuous pulse ox in place.  Will monitor.

## 2012-08-28 NOTE — Progress Notes (Signed)
Dr. Rito Ehrlich notified of ABG CO2 60.5. MD ok with removing Bipap for eating and taking PO medications.

## 2012-08-28 NOTE — Progress Notes (Signed)
Chart reviewed and documented in [place].  Next review due [id number]

## 2012-08-28 NOTE — Progress Notes (Signed)
Subjective: Pt resting comfortably.  No c/o.  Objective: Vital signs in last 24 hours: Temp:  [97.5 F (36.4 C)-98.1 F (36.7 C)] 98.1 F (36.7 C) (10/25 0429) Pulse Rate:  [112-118] 112  (10/25 0429) Resp:  [11-18] 18  (10/25 0429) BP: (119-149)/(72-96) 119/72 mmHg (10/25 0429) SpO2:  [92 %-95 %] 94 % (10/25 0429) Weight:  [113 kg (249 lb 1.9 oz)] 113 kg (249 lb 1.9 oz) (10/24 2215)  Intake/Output from previous day: 10/24 0701 - 10/25 0700 In: 160 [I.V.:160] Out: 400 [Urine:400] Intake/Output this shift:     Basename 08/28/12 0545 08/27/12 1650  HGB 7.5* 7.9*    Basename 08/28/12 0545 08/27/12 1650  WBC 8.6 7.9  RBC 2.88* 3.00*  HCT 24.8* 25.5*  PLT 121* 154    Basename 08/28/12 0545 08/27/12 1650  NA 132* 133*  K 5.5* 5.1  CL 92* 93*  CO2 35* 37*  BUN 50* 46*  CREATININE 2.02* 1.72*  GLUCOSE 178* 214*  CALCIUM 9.1 9.0    Basename 08/28/12 0545 08/27/12 1650  LABPT -- --  INR 3.23* 2.92*    wn wd woman in nad.  B LEs splinted.  Skin heatlhy and intact.  2+ dp and pt pulses.  Active PF and D F at toes and ankles  Assessment/Plan: R distal femur periprosthetic fx and L proximal tibia periprosthetic fracture - I'll talk with Dr. Charlann Boxer this morning about treatment plans.  O/w continue diet and DVT prophylaxis.   Gina Ashley 08/28/2012, 7:38 AM

## 2012-08-29 DIAGNOSIS — J9600 Acute respiratory failure, unspecified whether with hypoxia or hypercapnia: Secondary | ICD-10-CM

## 2012-08-29 LAB — COMPREHENSIVE METABOLIC PANEL
ALT: 9 U/L (ref 0–35)
AST: 13 U/L (ref 0–37)
Albumin: 3.1 g/dL — ABNORMAL LOW (ref 3.5–5.2)
Alkaline Phosphatase: 91 U/L (ref 39–117)
Calcium: 8.5 mg/dL (ref 8.4–10.5)
GFR calc Af Amer: 26 mL/min — ABNORMAL LOW (ref >90)
Potassium: 4.4 mEq/L (ref 3.5–5.1)
Sodium: 136 mEq/L (ref 135–145)
Total Protein: 6 g/dL (ref 6.0–8.3)

## 2012-08-29 LAB — PROTIME-INR: Prothrombin Time: 30.8 seconds — ABNORMAL HIGH (ref 11.6–15.2)

## 2012-08-29 LAB — TYPE AND SCREEN
ABO/RH(D): A POS
Antibody Screen: NEGATIVE
Unit division: 0
Unit division: 0

## 2012-08-29 LAB — CBC
Hemoglobin: 8.4 g/dL — ABNORMAL LOW (ref 12.0–15.0)
MCH: 26.8 pg (ref 26.0–34.0)
MCHC: 31.1 g/dL (ref 30.0–36.0)
Platelets: 100 10*3/uL — ABNORMAL LOW (ref 150–400)

## 2012-08-29 LAB — GLUCOSE, CAPILLARY

## 2012-08-29 MED ORDER — DILTIAZEM HCL 30 MG PO TABS
30.0000 mg | ORAL_TABLET | Freq: Once | ORAL | Status: AC
  Administered 2012-08-29: 30 mg via ORAL
  Filled 2012-08-29: qty 1

## 2012-08-29 MED ORDER — HYDROCODONE-ACETAMINOPHEN 5-325 MG PO TABS
1.0000 | ORAL_TABLET | ORAL | Status: DC | PRN
  Administered 2012-08-29 (×5): 1 via ORAL
  Administered 2012-08-30 (×2): 2 via ORAL
  Administered 2012-08-30: 1 via ORAL
  Administered 2012-08-30: 2 via ORAL
  Administered 2012-08-30: 1 via ORAL
  Administered 2012-08-31: 2 via ORAL
  Filled 2012-08-29 (×2): qty 1
  Filled 2012-08-29: qty 2
  Filled 2012-08-29 (×2): qty 1
  Filled 2012-08-29 (×2): qty 2
  Filled 2012-08-29: qty 1
  Filled 2012-08-29: qty 2
  Filled 2012-08-29 (×2): qty 1

## 2012-08-29 MED ORDER — GABAPENTIN 300 MG PO CAPS
300.0000 mg | ORAL_CAPSULE | Freq: Two times a day (BID) | ORAL | Status: DC
Start: 2012-08-29 — End: 2012-09-01
  Administered 2012-08-29 – 2012-08-31 (×5): 300 mg via ORAL
  Filled 2012-08-29 (×7): qty 1

## 2012-08-29 MED ORDER — HYDROCODONE-ACETAMINOPHEN 5-325 MG PO TABS: 2.0000 | ORAL_TABLET | ORAL | Status: DC | PRN

## 2012-08-29 MED ORDER — FENTANYL CITRATE 0.05 MG/ML IJ SOLN
25.0000 ug | INTRAMUSCULAR | Status: DC | PRN
  Administered 2012-08-29: 25 ug via INTRAVENOUS
  Administered 2012-08-30: 75 ug via INTRAVENOUS
  Filled 2012-08-29 (×2): qty 2

## 2012-08-29 MED ORDER — METOPROLOL TARTRATE 1 MG/ML IV SOLN
2.5000 mg | Freq: Two times a day (BID) | INTRAVENOUS | Status: DC | PRN
  Administered 2012-08-29 – 2012-09-09 (×3): 2.5 mg via INTRAVENOUS
  Filled 2012-08-29 (×3): qty 5

## 2012-08-29 MED ORDER — DILTIAZEM HCL 90 MG PO TABS
90.0000 mg | ORAL_TABLET | Freq: Four times a day (QID) | ORAL | Status: DC
  Administered 2012-08-29 – 2012-09-08 (×37): 90 mg via ORAL
  Filled 2012-08-29 (×48): qty 1

## 2012-08-29 MED ORDER — BUMETANIDE 0.25 MG/ML IJ SOLN
1.0000 mg | Freq: Two times a day (BID) | INTRAMUSCULAR | Status: DC
  Administered 2012-08-29 – 2012-08-30 (×3): 1 mg via INTRAVENOUS
  Filled 2012-08-29 (×4): qty 4

## 2012-08-29 MED ORDER — FLUTICASONE PROPIONATE HFA 110 MCG/ACT IN AERO
1.0000 | INHALATION_SPRAY | Freq: Two times a day (BID) | RESPIRATORY_TRACT | Status: DC
  Administered 2012-08-29 – 2012-09-15 (×28): 1 via RESPIRATORY_TRACT
  Filled 2012-08-29: qty 12

## 2012-08-29 NOTE — Progress Notes (Signed)
Hospitalist progress note. Chief complaint. Tachycardia. History of present illness. This 78 year old female in hospital with fractures of the lower remedy his. He was noted to have pleural effusions/fluid overload with IV diuresis initiated. Has a history of atrial fib and the patient has been not tachycardic in the 100-120 range for the past 24 hours. Coumadin on hold pending surgery. The patient was noted by staff to have a sustained heart rate in the 140s. A 12-lead EKG was obtained at the bedside and this suggested supraventricular tachycardia. I reviewed the EKG with Dr. Joneen Roach and felt this likely represented tachycardia without any indication of T wave. This does not appear to be atrial fib currently. EKG is available in the wall chart for review. The patient is currently maintained on BiPAP so review of symptoms is difficult. She denies any central chest pain. Vital signs. Temperature 97.9, pulse 146, respirations 20, blood pressure 118/81. O2 sats 100%. General appearance. This is a frail-appearing obese elderly female who is alert and follows commands. Cardiac. Regular and tachycardic. Apical rate now about 118. She does have some mild peripheral edema. Lungs. Decreased in all fields probably due to her habitus. There is no evidence of distress and O2 sats are stable. Abdomen. Soft with positive bowel sounds. No pain. Impression/plan Problem #1. Tachycardia. An EKG regular with a rate of 146. No evidence of T wave. Per my discussion with Dr. Joneen Roach we will increase the Cardizem to 90 mg 4 times daily. I also left a when necessary Metroprolol order 2.5 mg IV every 12 hours as needed for sustained heart rate greater than 120. I will follow up later as needed.

## 2012-08-29 NOTE — Progress Notes (Signed)
Pt placed on BIPAP, pt desaturated after RN administered 75 mcg fentanyl IV for pain. Pt responsive but lethargic. Pt on 40% FIO2, sat 96%. Will continue to monitor.

## 2012-08-29 NOTE — Progress Notes (Signed)
PCP: Ernestine Conrad, MD  Brief HPI: Pt reportedly from [facility] place and had an unwitnessed fall around 4:00 am on day of admission. After the fall patient subsequently developed lower extremity discomfort and was subsequently taken to [facility] ED for further evaluation. History was obtained from daughters in the room and ED records as patient was given dilaudid for BL LE discomfort and subsequently was "sleepy" per family. In the room initially ortho technician was at bedside placing braces and patient had discomfort with manipulation of lower extremities while placing gauze.  Past medical history:  Past Medical History  Diagnosis Date  . Diabetes mellitus     x 15 yrs  . GERD (gastroesophageal reflux disease)   . Back pain, chronic   . Pain in shoulder   . Pain in ankle   . Hiatal hernia   . HTN (hypertension)     all her life  . CKD (chronic kidney disease), stage III     GFR: 38  . Atrial fibrillation     since 1996  . Sleep apnea   . Fall 3 weeks ago    was evaluated at [facility]  . OSA on CPAP   . Depression   . PONV (postoperative nausea and vomiting)     difficult to wake up    Consultants: Ortho (Dr. Victorino Dike)  Procedures: None yet  Subjective: Patient more awake today. Complains of pain in the lower extremities. No shortness of breath. No Chest pain.   Objective: Vital signs in last 24 hours: Temp:  [97.2 F (36.2 C)-99.2 F (37.3 C)] 97.9 F (36.6 C) (10/26 0800) Pulse Rate:  [78-146] 78  (10/26 0800) Resp:  [12-21] 16  (10/26 0800) BP: (105-132)/(57-91) 122/71 mmHg (10/26 0800) SpO2:  [93 %-100 %] 97 % (10/26 0823) FiO2 (%):  [30 %-100 %] 30 % (10/26 0411) Weight:  [113 kg (249 lb 1.9 oz)-114.8 kg (253 lb 1.4 oz)] 113 kg (249 lb 1.9 oz) (10/26 0400) Weight change: 1.8 kg (3 lb 15.5 oz) Last BM Date: 08/24/12  Intake/Output from previous day: 10/25 0701 - 10/26 0700 In: 905 [P.O.:240; I.V.:30; Blood:625; IV Piggyback:10] Out: 850 [Urine:850] Intake/Output  this shift: Total I/O In: 20 [Other:20] Out: 125 [Urine:125]  General appearance: cooperative, appears stated age, no distress, moderately obese and somnolent Resp: decreased air entry at the bases. No definite crackles or wheezing Cardio: regular rate and rhythm, S1, S2 normal, no murmur, click, rub or gallop GI: soft, non-tender; bowel sounds normal; no masses,  no organomegaly Extremities: covered with braces. Edema is noted b/l LE. no erythema Pulses: good radial pulses, but poor pedal pulses Neurologic: More alert today. No focal deficits. Some tremors noted.  Lab Results:  [facility] 08/29/12 0440 08/28/12 0545  WBC 8.2 8.6  HGB 8.4* 7.5*  HCT 27.0* 24.8*  PLT 100* 121*   BMET  Basename 08/29/12 0440 08/28/12 1135  NA 136 135  K 4.4 5.6*  CL 94* 94*  CO2 37* 36*  GLUCOSE 178* 209*  BUN 49* 51*  CREATININE 1.94* 2.21*  CALCIUM 8.5 9.1  ALT 9 --    Studies/Results: Dg Chest 1 View  08/27/2012  *RADIOLOGY REPORT*  Clinical Data: Post fall, history of atrial fibrillation  CHEST - 1 VIEW  Comparison: 07/26/2012; 07/25/2012; 07/18/2012  Findings:  Grossly unchanged enlarged cardiac silhouette and mediastinal contours.  Interval development of a moderate-sized right-sided pleural effusion.  Pulmonary vasculature remains indistinct with cephalization of flow.  No definite left-sided pleural effusion.  No definite pneumothorax.  Calcification overlying the left lower neck may be within the left lobe of the thyroid.  No definite acute osseous abnormalities.  IMPRESSION: 1.  Interval development of a moderate-sized right-sided pleural effusion with persistent findings of mild pulmonary edema.  Further evaluation with a PA and lateral chest radiograph may be obtained as clinically indicated. 2.  Possible calcification within the left lobe of the thyroid. Further evaluation with thyroid ultrasound may be performed in the non emergent setting as clinically indicated   Original Report  Authenticated By: Waynard Reeds, M.D.    Dg Femur Right  08/27/2012  *RADIOLOGY REPORT*  Clinical Data: Post fall, now with severe pain and tenderness involving the distal aspect of the femur  RIGHT FEMUR - 2 VIEW  Comparison: None.  Findings:  There is an oblique, minimally displaced, slightly impacted fracture of the distal femoral metaphysis.  There is no definitive extension of the fracture into the right total knee replacement hardware.  No definite evidence of hardware failure or loosening. No definite suprapatellar joint effusion, though evaluation degraded due to technique.  No radiopaque foreign body.  IMPRESSION: Oblique, minimally displaced, slightly impacted fracture of the distal femoral metaphysis without definite extension to involve the femoral component of the right total knee replacement.   Original Report Authenticated By: Waynard Reeds, M.D.    Dg Tibia/fibula Left  08/27/2012  *RADIOLOGY REPORT*  Clinical Data: Post fall, now with leg pain  LEFT TIBIA AND FIBULA - 2 VIEW  Comparison: None.  Findings: There is an oblique, displaced fracture of the proximal tibial metaphysis with apparent extension to involve the tibial component of the left total knee hardware, which appears minimally angulated, apex ventral.  This is the adjacent soft tissue swelling.  No radiopaque foreign body.  Vascular calcifications.  IMPRESSION: Oblique, displaced fracture of the proximal tibial metaphysis with extension to involve the tibial component of the left total knee replacement hardware.   Original Report Authenticated By: Waynard Reeds, M.D.    Dg Chest Port 1 View  08/28/2012  *RADIOLOGY REPORT*  Clinical Data: Confirm the line placement.  Status post PICC placement.  PORTABLE CHEST - 1 VIEW  Comparison: 08/27/2012  Findings: The patient is slightly rotated to the left.  Right upper extremity PICC projects over the distal superior vena cava.  Its tip is approximately 3 cm inferior to the  carina.  Cardiomegaly, pulmonary vascular congestion, and pulmonary edema persist.  There are bilateral pleural effusions, right greater than left.  No evidence of pneumothorax.  Coarse, chunky calcification in the left neck is unchanged compared to cervical spine radiographs of 2002.  Question if this could be a calcification within the left thyroid lobe.  IMPRESSION:  1. The distal tip of the right upper extremity PICC projects over the distal superior vena cava, in satisfactory position. 2.  Congestive heart failure pattern with bilateral pleural effusions, right greater than left.   Original Report Authenticated By: Britta Mccreedy, M.D.     Medications:  Scheduled:    . antiseptic oral rinse  15 mL Mouth Rinse q12n4p  . chlorhexidine  15 mL Mouth Rinse BID  . diltiazem  30 mg Oral Once  . diltiazem  90 mg Oral QID  . feeding supplement  30 mL Oral TID WC  . fentaNYL      . furosemide  40 mg Intravenous Once  . gabapentin  300 mg Oral TID  . insulin aspart  0-15  Units Subcutaneous TID WC  . insulin glargine  5 Units Subcutaneous QHS  . isosorbide dinitrate  20 mg Oral TID  . levalbuterol  0.63 mg Nebulization Q6H  . pantoprazole (PROTONIX) IV  40 mg Intravenous QHS  . sodium chloride  10-40 mL Intracatheter Q12H  . sodium chloride  3 mL Intravenous Q12H  . sodium chloride  3 mL Intravenous Q12H  . sodium polystyrene  30 g Oral Once  . sodium polystyrene  30 g Oral Once  . sotalol  80 mg Oral BID  . torsemide  30 mg Oral BID  . DISCONTD: diltiazem  60 mg Oral QID  . DISCONTD: furosemide  60 mg Intravenous Q12H  . DISCONTD: torsemide  30 mg Intravenous Q12H   Continuous:  QMV:HQIONGEX sodium, feeding supplement, fentaNYL, HYDROcodone-acetaminophen, levalbuterol, metoprolol, ondansetron (ZOFRAN) IV, ondansetron, sodium chloride, sodium chloride, DISCONTD: sodium chloride, DISCONTD: fentaNYL, DISCONTD: fentaNYL, DISCONTD:  HYDROmorphone (DILAUDID)  injection  Assessment/Plan:  Principal Problem:  *Multiple fractures of both lower extremities Active Problems:  DM  OBSTRUCTIVE SLEEP APNEA  GERD  Atrial fibrillation  HTN (hypertension)  COPD (chronic obstructive pulmonary disease)  Hyponatremia  CKD (chronic kidney disease)  Pleural effusion  Hypercapnemia  Acute respiratory failure   Multiple fractures of both lower extremities Seen by Ortho. They will discuss and if surgery is planned, it will be sometime next week. Pain control is an issue. Patient apparently becomes somnolent with Fentanyl. Try to avoid as much as possible. She had the same issue with Dilaudid and she is allergic to Morphine. Will increase dose of oral agents. Continue Bedrest. Foley. May require prolonged ventilatory support if patient undergoes surgery.   Acute respiratory failure Possibly from OSA, hypercapnea and narcotics. Has improved with Bipap. ABG showed improvement as well. Will leave of Bipap today if possible. CPAP qhs.  Pleural Effusion/Fluid Overload Had normal systolic EF on recent ECHO. Has normal WBC. Infectious process is less likely. Family did not want patient to get Lasix. No IV torsemide available. Will try IV Bumex. Will monitor renal function closely while on diuretics. Will repeat CXR in 1-2 days.  History of Atrial fibrillation  Was tachycardic overnight but did not appear to be afib. Cardizem dose was increased. Metoprolol as needed. Holding warfarin in anticipation of surgery. Due to recent TIA will like her back on anti-coagulation as soon as possible.  Hyponatremia  Based on x ray findings and physical exam, suspect that this is secondary to hypervolemic hyponatremia from diastolic HF. Has improved.   Anemia Likely secondary to chronic disease. Was transfused. Monitor.  History of COPD and OSA Stable. See above. Start inhaled steroids. May need to involve pulmonology if surgery is planned.  History of HTN  Continue to  monitor BP. Holding hydralazine. Continue other meds.   DM 2 SSI while patient in house. Continue lantus. Since surgery is not anticipated till next week, will initiate diet.   GERD  Protonix IV  CKD Stage 3 with Hyperkalemia Baseline creatinine between 1.7-1.9. Potassium improved. Creatinine is better as well. Continue to monitor.   Code Status Full Code  Family Communication No family at bedside  Disposition Plan Will likely need to go back to SNF when ready.   DVT Prophylaxis Therapeutic INR. Repeat INR today.   LOS: 2 days   [facility]  [facility] Pager [phone] 08/29/2012, 8:34 AM

## 2012-08-29 NOTE — Progress Notes (Addendum)
Pt took off her BIPAP mask saying " If it's time for me to die I am ready". Pt placed on 100% non rebreather mask. Will continue to monitor. O2 sats 100%.

## 2012-08-30 ENCOUNTER — Inpatient Hospital Stay (HOSPITAL_COMMUNITY): Payer: Medicare Other

## 2012-08-30 LAB — PHOSPHORUS: Phosphorus: 5.2 mg/dL — ABNORMAL HIGH (ref 2.3–4.6)

## 2012-08-30 LAB — CBC
Platelets: 116 10*3/uL — ABNORMAL LOW (ref 150–400)
RBC: 3.04 MIL/uL — ABNORMAL LOW (ref 3.87–5.11)
WBC: 8.9 10*3/uL (ref 4.0–10.5)

## 2012-08-30 LAB — PROTIME-INR
INR: 2.35 — ABNORMAL HIGH (ref 0.00–1.49)
Prothrombin Time: 24.7 seconds — ABNORMAL HIGH (ref 11.6–15.2)

## 2012-08-30 LAB — GLUCOSE, CAPILLARY
Glucose-Capillary: 249 mg/dL — ABNORMAL HIGH (ref 70–99)
Glucose-Capillary: 187 mg/dL — ABNORMAL HIGH (ref 70–99)
Glucose-Capillary: 232 mg/dL — ABNORMAL HIGH (ref 70–99)

## 2012-08-30 LAB — BASIC METABOLIC PANEL
CO2: 37 mEq/L — ABNORMAL HIGH (ref 19–32)
Chloride: 93 mEq/L — ABNORMAL LOW (ref 96–112)
Potassium: 4.4 mEq/L (ref 3.5–5.1)
Sodium: 136 mEq/L (ref 135–145)

## 2012-08-30 MED ORDER — PANTOPRAZOLE SODIUM 40 MG PO TBEC
40.0000 mg | DELAYED_RELEASE_TABLET | Freq: Every day | ORAL | Status: DC
  Administered 2012-08-30 – 2012-09-08 (×10): 40 mg via ORAL
  Filled 2012-08-30 (×12): qty 1

## 2012-08-30 MED ORDER — METOLAZONE 5 MG PO TABS
5.0000 mg | ORAL_TABLET | Freq: Every day | ORAL | Status: DC
  Administered 2012-08-30 – 2012-09-03 (×5): 5 mg via ORAL
  Filled 2012-08-30 (×5): qty 1

## 2012-08-30 MED ORDER — FUROSEMIDE 10 MG/ML IJ SOLN
120.0000 mg | Freq: Three times a day (TID) | INTRAVENOUS | Status: DC
  Administered 2012-08-30 – 2012-08-31 (×2): 120 mg via INTRAVENOUS
  Filled 2012-08-30 (×3): qty 12

## 2012-08-30 MED ORDER — INSULIN GLARGINE 100 UNIT/ML ~~LOC~~ SOLN
8.0000 [IU] | Freq: Every day | SUBCUTANEOUS | Status: DC
  Administered 2012-08-30 – 2012-08-31 (×2): 8 [IU] via SUBCUTANEOUS

## 2012-08-30 NOTE — Progress Notes (Signed)
The patient is receiving Protonix by the intravenous route. Based on criteria approved by the Pharmacy and Therapeutics Committee and the Medical Executive Committee, the medication is being converted to the equivalent oral dose form.   These criteria include:  -No Active GI bleeding  -Able to tolerate diet of full liquids (or better) or tube feeding  -Able to tolerate other medications by the oral or enteral route   If you have any questions about this conversion, please contact the Pharmacy Department ([phone] Thank you.   Loralee Pacas, PharmD, BCPS 08/30/2012 10:47 AM

## 2012-08-30 NOTE — Progress Notes (Signed)
Patient ID: Gina Ashley, female   DOB: 12-27-1926, 78 y.o.   MRN: [id number]  [name] presents with a very challenging problem involving both of her extremities.  Complicated due to the procedures necessary to correct but also related to her poor conditioning.  Right distal femur periprosthetic femur fracture Left proximal tibial periprosthetic fracture.  INR 2.38  Obviously pain in both extremities Obese female with significant adipose tissue involving her extremities  She will need ORIF of her right distal femur I'm thinking that she will need to have her left lower extremity placed in external fixator to allow ease of hygiene during this significant recovery period with potential significant co-morbid implications  Once medical stable and INR below 2, hopefully by Tuesday this week, I will be able to get to the OR to fix above problems.  Will follow up Monday to check labs and general condition of the patient.

## 2012-08-30 NOTE — Progress Notes (Signed)
PCP: Ernestine Conrad, MD  Brief HPI: Pt reportedly from [facility] place and had an unwitnessed fall around 4:00 am on day of admission. After the fall patient subsequently developed lower extremity discomfort and was subsequently taken to [facility] ED for further evaluation. History was obtained from daughters in the room and ED records as patient was given dilaudid for BL LE discomfort and subsequently was "sleepy" per family. In the room initially ortho technician was at bedside placing braces and patient had discomfort with manipulation of lower extremities while placing gauze.  Past medical history:  Past Medical History  Diagnosis Date  . Diabetes mellitus     x 15 yrs  . GERD (gastroesophageal reflux disease)   . Back pain, chronic   . Pain in shoulder   . Pain in ankle   . Hiatal hernia   . HTN (hypertension)     all her life  . CKD (chronic kidney disease), stage III     GFR: 38  . Atrial fibrillation     since 1996  . Sleep apnea   . Fall 3 weeks ago    was evaluated at [facility]  . OSA on CPAP   . Depression   . PONV (postoperative nausea and vomiting)     difficult to wake up    Consultants: Ortho (Dr. Victorino Dike)  Procedures: None yet  Subjective: Patient complaining of pain in the legs. Otherwise feels fine. Currently on Bipap. Denies shortness of breath. No Chest pain.   Objective: Vital signs in last 24 hours: Temp:  [97.6 F (36.4 C)-99.4 F (37.4 C)] 97.6 F (36.4 C) (10/27 0400) Pulse Rate:  [56-127] 82  (10/27 0600) Resp:  [12-17] 12  (10/27 0600) BP: (89-128)/(48-71) 109/55 mmHg (10/27 0600) SpO2:  [94 %-100 %] 98 % (10/27 0600) FiO2 (%):  [40 %] 40 % (10/27 0600) Weight change:  Last BM Date: 08/24/12  Intake/Output from previous day: 10/26 0701 - 10/27 0700 In: 965 [P.O.:515; I.V.:10] Out: 665 [Urine:665] Intake/Output this shift:    General appearance: cooperative, appears stated age, no distress, moderately obese Resp: decreased air entry at the  bases. No definite crackles or wheezing Cardio: regular rate and rhythm, S1, S2 normal, no murmur, click, rub or gallop GI: soft, non-tender; bowel sounds normal; no masses,  no organomegaly Extremities: covered with braces. Edema is noted b/l LE. no erythema Pulses: good radial pulses, but poor pedal pulses Neurologic: More alert today. No focal deficits. Some tremors noted.  Lab Results:  Basename 08/30/12 0510 08/29/12 0440  WBC 8.9 8.2  HGB 8.2* 8.4*  HCT 26.5* 27.0*  PLT 116* 100*   BMET  Basename 08/30/12 0510 08/29/12 0440  NA 136 136  K 4.4 4.4  CL 93* 94*  CO2 37* 37*  GLUCOSE 217* 178*  BUN 61* 49*  CREATININE 2.43* 1.94*  CALCIUM 8.5 8.5  ALT -- 9    Studies/Results: Dg Chest Port 1 View  08/28/2012  *RADIOLOGY REPORT*  Clinical Data: Confirm the line placement.  Status post PICC placement.  PORTABLE CHEST - 1 VIEW  Comparison: 08/27/2012  Findings: The patient is slightly rotated to the left.  Right upper extremity PICC projects over the distal superior vena cava.  Its tip is approximately 3 cm inferior to the carina.  Cardiomegaly, pulmonary vascular congestion, and pulmonary edema persist.  There are bilateral pleural effusions, right greater than left.  No evidence of pneumothorax.  Coarse, chunky calcification in the left neck is unchanged compared  to cervical spine radiographs of 2002.  Question if this could be a calcification within the left thyroid lobe.  IMPRESSION:  1. The distal tip of the right upper extremity PICC projects over the distal superior vena cava, in satisfactory position. 2.  Congestive heart failure pattern with bilateral pleural effusions, right greater than left.   Original Report Authenticated By: Britta Mccreedy, M.D.     Medications:  Scheduled:    . antiseptic oral rinse  15 mL Mouth Rinse q12n4p  . bumetanide  1 mg Intravenous Q12H  . chlorhexidine  15 mL Mouth Rinse BID  . diltiazem  90 mg Oral QID  . feeding supplement  30 mL Oral  TID WC  . fluticasone  1 puff Inhalation BID  . gabapentin  300 mg Oral BID  . insulin aspart  0-15 Units Subcutaneous TID WC  . insulin glargine  5 Units Subcutaneous QHS  . isosorbide dinitrate  20 mg Oral TID  . levalbuterol  0.63 mg Nebulization Q6H  . pantoprazole (PROTONIX) IV  40 mg Intravenous QHS  . sodium chloride  10-40 mL Intracatheter Q12H  . sotalol  80 mg Oral BID  . DISCONTD: gabapentin  300 mg Oral TID  . DISCONTD: sodium chloride  3 mL Intravenous Q12H  . DISCONTD: sodium chloride  3 mL Intravenous Q12H  . DISCONTD: torsemide  30 mg Oral BID   Continuous:  ZOX:[id number] sodium, feeding supplement, fentaNYL, HYDROcodone-acetaminophen, levalbuterol, metoprolol, ondansetron (ZOFRAN) IV, ondansetron, sodium chloride, DISCONTD: fentaNYL, DISCONTD: HYDROcodone-acetaminophen, DISCONTD: HYDROcodone-acetaminophen, DISCONTD: sodium chloride  Assessment/Plan:  Principal Problem:  *Multiple fractures of both lower extremities Active Problems:  DM  OBSTRUCTIVE SLEEP APNEA  GERD  Atrial fibrillation  HTN (hypertension)  COPD (chronic obstructive pulmonary disease)  Hyponatremia  CKD (chronic kidney disease)  Pleural effusion  Hypercapnemia  Acute respiratory failure   Multiple fractures of both lower extremities Seen by Ortho. They will discuss and if surgery is planned, it will be sometime this week. Pain control is an issue. Patient apparently becomes somnolent with Fentanyl. Try to avoid as much as possible. She had the same issue with Dilaudid and she is allergic to Morphine. Have increased dose of oral agents. Continue Bedrest. Foley. May require prolonged ventilatory support if patient undergoes surgery.   Acute respiratory failure Possibly from OSA, hypercapnea and narcotics. Has improved with Bipap. ABG showed improvement as well. Bipap QHS and PRn during daytime.   Pleural Effusion/Fluid Overload Had normal systolic EF on recent ECHO. Has normal WBC. Infectious  process is less likely. Family did not want patient to get Lasix. No IV torsemide available. Currently on IV Bumex. Will monitor renal function closely while on diuretics. Will repeat CXR today.  Acute on CKD Stage 3 Creatinine getting worse with inadequate urine output. Will involve Nephrology. Get Renal US. Repeat CXR. Baseline creatinine between 1.7-1.9. Potassium improved.    History of COPD and OSA Stable. See above. Start inhaled steroids. Will involve pulmonology once surgical plan is outlined.  History of HTN  Continue to monitor BP. BP running low at times but she remains asymptomatic. Monitor for now without changes. Holding hydralazine. Continue other meds. Could hold Imdur as well if BP continues to be low.  History of Atrial fibrillation  Was tachycardic 1025 but did not appear to be afib. HR now stable. Cardizem dose was increased. Metoprolol as needed. Holding warfarin in anticipation of surgery. Due to recent TIA will like her back on anti-coagulation as soon as possible.  Hyponatremia  Based on x ray findings and physical exam, suspect that this is secondary to hypervolemic hyponatremia from diastolic HF. Has improved.   Anemia Likely secondary to chronic disease. Was transfused. Monitor. May need to transfuse prior to surgery.  DM 2 SSI while patient in house. Increase dose of lantus.   GERD  Protonix IV  Code Status Full Code  Family Communication Discussed with daughter at bedside  Disposition Plan Will likely need to go back to SNF when ready.   DVT Prophylaxis Therapeutic INR.    LOS: 3 days   [facility]  [facility] Pager [phone] 08/30/2012, 7:27 AM

## 2012-08-30 NOTE — Consult Note (Signed)
Gina Ashley 08/30/2012 Gina Ashley D Requesting Physician:  Dr. Rito Ehrlich  Reason for Consult:  Acute on CRF HPI: The patient is a 78 y.o. year-old with long hx of DM2, longer hx of HTN, CKD IV with baseline creat 1.5-1.9 who was admitted after a fall with bilat LE fractures. On the R there is a distal femur periprosthetic fx, and on the L there is a proximal tibial periprosthetic fracture. Ortho has seen and is considering pt for surgery. She has other complications including acute on CRF, marked volume overload with CHF by xray, and atrial fibrillation.   Patient has no specific complaints. Daughter is in the room.   ROS  no cp, sob, cough  no n/v/d, no abd pain  no rash or itching  +leg swelling  no sore throat  no fevers  no focal weakness  has seen a kidney doctor once or twice only in the past in [place] (Dr. Fausto Ashley)  Past Medical History:  Past Medical History  Diagnosis Date  . Diabetes mellitus     x 15 yrs  . GERD (gastroesophageal reflux disease)   . Back pain, chronic   . Pain in shoulder   . Pain in ankle   . Hiatal hernia   . HTN (hypertension)     all her life  . CKD (chronic kidney disease), stage III     GFR: 38  . Atrial fibrillation     since 1996  . Sleep apnea   . Fall 3 weeks ago    was [facility] at [facility]  . OSA on CPAP   . Depression   . PONV (postoperative nausea and vomiting)     difficult to wake up    Past Surgical History:  Past Surgical History  Procedure Date  . Bunionectomy     bilateral  . Cholecystectomy   . Rotator cuff repair     2002  . Total knee arthroplasty     bilateral 2001  . Hammer toe surgery     2004  . Colonoscopy 07/11/2011    Procedure: COLONOSCOPY;  Surgeon: Malissa Hippo, MD;  Location: [facility];  Service: Endoscopy;  Laterality: N/A;  3:00    Family History:  Family History  Problem Relation Age of Onset  . Colon cancer Brother    Social History:  reports that she has never smoked. She  has never used smokeless tobacco. She reports that she does not drink alcohol or use illicit drugs.  Allergies:  Allergies  Allergen Reactions  . Morphine Sulfate Other (See Comments)    REACTION: change in personality  . Percocet (Oxycodone-Acetaminophen) Other (See Comments)    Does not relieve the pain  . Remeron (Mirtazapine) Other (See Comments)    Altered mental status, lethargy  . Tuna (Fish Allergy)   . Penicillins Rash    Home medications: Prior to Admission medications   Medication Sig Start Date End Date Taking? Authorizing Provider  allopurinol (ZYLOPRIM) 300 MG tablet Take 150 mg by mouth daily.   Yes Historical Provider, MD  diltiazem (CARDIZEM) 60 MG tablet Take 60 mg by mouth 4 (four) times daily.   Yes Historical Provider, MD  docusate sodium (COLACE) 100 MG capsule Take 100 mg by mouth 2 (two) times daily as needed. constipation   Yes Historical Provider, MD  gabapentin (NEURONTIN) 300 MG capsule Take 300 mg by mouth 3 (three) times daily.    Yes Historical Provider, MD  hydrALAZINE (APRESOLINE) 50 MG tablet Take 50  mg by mouth 3 (three) times daily.   Yes Historical Provider, MD  HYDROcodone-acetaminophen (NORCO/VICODIN) 5-325 MG per tablet Take 2 tablets by mouth every 6 (six) hours as needed. pain   Yes Historical Provider, MD  insulin aspart (NOVOLOG) 100 UNIT/ML injection Inject 5 Units into the skin 3 (three) times daily before meals. cbg>150   Yes Historical Provider, MD  insulin glargine (LANTUS) 100 UNIT/ML injection Inject 5 Units into the skin at bedtime.   Yes Historical Provider, MD  iron polysaccharides (NIFEREX) 150 MG capsule Take 150 mg by mouth daily.   Yes Historical Provider, MD  isosorbide dinitrate (ISORDIL) 20 MG tablet Take 20 mg by mouth 3 (three) times daily.   Yes Historical Provider, MD  pantoprazole (PROTONIX) 40 MG tablet Take 40 mg by mouth daily.   Yes Historical Provider, MD  polyethylene glycol (MIRALAX / GLYCOLAX) packet Take 17 g by  mouth daily.   Yes Historical Provider, MD  sotalol (BETAPACE) 80 MG tablet Take 80 mg by mouth 2 (two) times daily.   Yes Historical Provider, MD  torsemide (DEMADEX) 5 MG tablet Take 5 mg by mouth daily.   Yes Historical Provider, MD  warfarin (COUMADIN) 2.5 MG tablet Take 2.5 mg by mouth daily. Take with 3 mg   Yes Historical Provider, MD  warfarin (COUMADIN) 3 MG tablet Take 3 mg by mouth daily. Take with 2.5 mg   Yes Historical Provider, MD    Inpatient medications:    . antiseptic oral rinse  15 mL Mouth Rinse q12n4p  . bumetanide  1 mg Intravenous Q12H  . chlorhexidine  15 mL Mouth Rinse BID  . diltiazem  90 mg Oral QID  . feeding supplement  30 mL Oral TID WC  . fluticasone  1 puff Inhalation BID  . gabapentin  300 mg Oral BID  . insulin aspart  0-15 Units Subcutaneous TID WC  . insulin glargine  8 Units Subcutaneous QHS  . isosorbide dinitrate  20 mg Oral TID  . levalbuterol  0.63 mg Nebulization Q6H  . pantoprazole  40 mg Oral Daily  . sodium chloride  10-40 mL Intracatheter Q12H  . sotalol  80 mg Oral BID  . DISCONTD: insulin glargine  5 Units Subcutaneous QHS  . DISCONTD: pantoprazole (PROTONIX) IV  40 mg Intravenous QHS    Labs: Basic Metabolic Panel:  Lab 08/30/12 9147 08/29/12 0440 08/28/12 1135 08/28/12 0545 08/27/12 2110 08/27/12 1650 08/25/12 0455 08/24/12 1612  NA 136 136 135 132* -- 133* 135 133*  K 4.4 4.4 5.6* 5.5* -- 5.1 4.8 4.8  CL 93* 94* 94* 92* -- 93* 93* 92*  CO2 37* 37* 36* 35* -- 37* 37* 37*  GLUCOSE 217* 178* 209* 178* -- 214* 158* 174*  BUN 61* 49* 51* 50* -- 46* 50* 59*  CREATININE 2.43* 1.94* 2.21* 2.02* -- 1.72* 1.79* 2.04*  ALB -- -- -- -- -- -- -- --  CALCIUM 8.5 8.5 9.1 9.1 -- 9.0 9.2 9.2  PHOS 5.2* -- -- -- 4.7* -- -- --   Liver Function Tests:  Lab 08/29/12 0440  AST 13  ALT 9  ALKPHOS 91  BILITOT 0.7  PROT 6.0  ALBUMIN 3.1*   No results found for this basename: LIPASE:3,AMYLASE:3 in the last 168 hours No results found for  this basename: AMMONIA:3 in the last 168 hours CBC:  Lab 08/30/12 0510 08/29/12 0440 08/28/12 0545 08/27/12 1650  WBC 8.9 8.2 8.6 7.9  NEUTROABS -- -- -- 6.2  HGB 8.2* 8.4* 7.5* 7.9*  HCT 26.5* 27.0* 24.8* 25.5*  MCV 87.2 86.0 86.1 85.0  PLT 116* 100* 121* 154   PT/INR: @labrcntip (inr:5) Cardiac Enzymes: No results found for this basename: CKTOTAL:5,CKMB:5,CKMBINDEX:5,TROPONINI:5 in the last 168 hours CBG:  Lab 08/30/12 1548 08/30/12 1118 08/30/12 0747 08/29/12 2132 08/29/12 1122  GLUCAP 210* 258* 198* 187* 241*    Iron Studies: No results found for this basename: IRON:30,TIBC:30,TRANSFERRIN:30,FERRITIN:30 in the last 168 hours  Xrays/Other Studies: US Renal Port  08/30/2012  *RADIOLOGY REPORT*  Clinical Data: Acute renal failure  RENAL/URINARY TRACT ULTRASOUND COMPLETE  Comparison: CT 05/22/2012  Findings:  Right Kidney = 10.3 cm.  No hydronephrosis or mass.  A small simple cyst in upper pole.  Left kidney = Not well visualized. Bladder:  Foley catheter placed in the  IMPRESSION:  Normal right kidney.  Left kidney is poorly assessed.   Original Report Authenticated By: Genevive Bi, M.D.    Dg Chest Port 1 View  08/30/2012  *RADIOLOGY REPORT*  Clinical Data: Fall pleural effusions  PORTABLE CHEST - 1 VIEW  Comparison: Chest radiograph 08/28/2012  Findings: Stable enlarged heart silhouette.  There is a right PICC line in place.  There is generalized central venous congestion and low lung volumes with small pleural effusions. Effusions are improved.  No pneumothorax.  IMPRESSION:  1.  Low lung volumes and pleural effusions with central venous congestion suggest congestive heart failure.  2.  Effusions are slightly improved.   Original Report Authenticated By: Genevive Bi, M.D.     Physical Exam:  Blood pressure 117/74, pulse 115, temperature 98.3 F (36.8 C), temperature source Oral, resp. rate 11, height 5\' 3"  (1.6 m), weight 113 kg (249 lb 1.9 oz), SpO2 96.00%.  Gen:  elderly obese AAF, lying flat, no distress. Somnolent, arouses easily Skin: no rash, cyanosis HEENT:  EOMI, sclera anicteric, throat clear Neck: + JVD, no bruits or LAN Chest: bibasilar faint rales Heart: irregular, no rub or gallop, no murmur Abdomen: soft, obese, nontender, liver down 5 cm Ext: 2+ pitting edema bilat thighs and feet, bilat LE braces in place not removed, mild UE edema and facial edema also Neuro: groggy, Ox3, nonfocal motor exam, +asterixis  UA- neg ECHO- EF 60% CXR 10/27- bilat effusion, vasc congestion c/w CHF [place]- R kidney 10cm, no hydro. L kidney poorly assessed.   Impression: 1. Acute on CRF- creat 2.4. Suspect hemodynamic cause from decompensated HF. Check urine lytes. No dye, ACEI or NSAID's recently. UA normal.  2. Volume excess- CHF/edema/anasarca, not diuresing on current regimen. Agree that lasix prob didn't cause kidney failure. Explained to family, they are agreeable to using lasix.  Will change to high-dose IV lasix.  3. CKD IV- baseline creat 1.5-1.9 in Aug 2013 (eGFR 22-30) 4. Atrial fib- need to keep BP over 100 for renal perfusion. Hold diltiazem order written if bp less than 105  5. Hx of diast HF 6. S/P fall with bilat LE fractures; supposed to go to OR this week 7. HTN- many many yrs 8. DM2- 15 years 9. OSA- cpap at night 10. Obesity  Rec- Change to IV lasix at high doses and diurese. Add zaroxlyn. Avoid nephrotoxins. Keep BP over 100. Pt is not a candidate for dialysis due to advanced age and comorbidities.  I explained this to patient and daughter. Recommend continue supportive care, attempt to diurese.    Vinson Moselle  MD [employer] [phone] pgr    [phone] cell 08/30/2012, 7:27 PM

## 2012-08-31 ENCOUNTER — Encounter (HOSPITAL_COMMUNITY): Payer: Self-pay | Admitting: Cardiology

## 2012-08-31 DIAGNOSIS — I5032 Chronic diastolic (congestive) heart failure: Secondary | ICD-10-CM

## 2012-08-31 DIAGNOSIS — D638 Anemia in other chronic diseases classified elsewhere: Secondary | ICD-10-CM

## 2012-08-31 DIAGNOSIS — G4733 Obstructive sleep apnea (adult) (pediatric): Secondary | ICD-10-CM

## 2012-08-31 DIAGNOSIS — I10 Essential (primary) hypertension: Secondary | ICD-10-CM

## 2012-08-31 DIAGNOSIS — J9620 Acute and chronic respiratory failure, unspecified whether with hypoxia or hypercapnia: Secondary | ICD-10-CM

## 2012-08-31 HISTORY — DX: Chronic diastolic (congestive) heart failure: I50.32

## 2012-08-31 LAB — CBC
HCT: 27.7 % — ABNORMAL LOW (ref 36.0–46.0)
MCV: 87.4 fL (ref 78.0–100.0)
Platelets: 141 10*3/uL — ABNORMAL LOW (ref 150–400)
RBC: 3.17 MIL/uL — ABNORMAL LOW (ref 3.87–5.11)
WBC: 9.4 10*3/uL (ref 4.0–10.5)

## 2012-08-31 LAB — RENAL FUNCTION PANEL
CO2: 34 mEq/L — ABNORMAL HIGH (ref 19–32)
Calcium: 8.6 mg/dL (ref 8.4–10.5)
Chloride: 93 mEq/L — ABNORMAL LOW (ref 96–112)
GFR calc Af Amer: 18 mL/min — ABNORMAL LOW (ref >90)
Glucose, Bld: 240 mg/dL — ABNORMAL HIGH (ref 70–99)
Sodium: 135 mEq/L (ref 135–145)

## 2012-08-31 LAB — BLOOD GAS, ARTERIAL
Acid-Base Excess: 7 mmol/L — ABNORMAL HIGH (ref 0.0–2.0)
Bicarbonate: 34.2 mEq/L — ABNORMAL HIGH (ref 20.0–24.0)
TCO2: 32.8 mmol/L (ref 0–100)
pCO2 arterial: 69.6 mmHg (ref 35.0–45.0)
pH, Arterial: 7.312 — ABNORMAL LOW (ref 7.350–7.450)
pO2, Arterial: 89.7 mmHg (ref 80.0–100.0)

## 2012-08-31 LAB — GLUCOSE, CAPILLARY: Glucose-Capillary: 199 mg/dL — ABNORMAL HIGH (ref 70–99)

## 2012-08-31 LAB — PROTIME-INR: INR: 1.88 — ABNORMAL HIGH (ref 0.00–1.49)

## 2012-08-31 LAB — PROCALCITONIN: Procalcitonin: <0.1 ng/mL

## 2012-08-31 MED ORDER — FUROSEMIDE 10 MG/ML IJ SOLN
160.0000 mg | Freq: Three times a day (TID) | INTRAVENOUS | Status: DC
  Administered 2012-08-31 – 2012-09-01 (×3): 160 mg via INTRAVENOUS
  Filled 2012-08-31 (×4): qty 16

## 2012-08-31 MED ORDER — LEVALBUTEROL HCL 0.63 MG/3ML IN NEBU
0.6300 mg | INHALATION_SOLUTION | Freq: Two times a day (BID) | RESPIRATORY_TRACT | Status: DC
  Administered 2012-08-31 – 2012-09-13 (×27): 0.63 mg via RESPIRATORY_TRACT
  Filled 2012-08-31 (×30): qty 3

## 2012-08-31 MED ORDER — HYDROCODONE-ACETAMINOPHEN 5-325 MG PO TABS
1.0000 | ORAL_TABLET | ORAL | Status: DC | PRN
  Administered 2012-08-31 – 2012-09-02 (×11): 1 via ORAL
  Filled 2012-08-31 (×11): qty 1

## 2012-08-31 MED ORDER — POLYETHYLENE GLYCOL 3350 17 G PO PACK
17.0000 g | PACK | Freq: Every day | ORAL | Status: DC
  Administered 2012-08-31 – 2012-09-01 (×2): 17 g via ORAL
  Filled 2012-08-31 (×2): qty 1

## 2012-08-31 NOTE — Progress Notes (Signed)
PCP: Ernestine Conrad, MD  Brief HPI: Pt reportedly from [facility] place and had an unwitnessed fall around 4:00 am on day of admission. After the fall patient subsequently developed lower extremity discomfort and was subsequently taken to [facility] ED for further evaluation. History was obtained from daughters in the room and ED records as patient was given dilaudid for BL LE discomfort and subsequently was "sleepy" per family. In the room initially ortho technician was at bedside placing braces and patient had discomfort with manipulation of lower extremities while placing gauze.  Past medical history:  Past Medical History  Diagnosis Date  . Diabetes mellitus     x 15 yrs  . GERD (gastroesophageal reflux disease)   . Back pain, chronic   . Pain in shoulder   . Pain in ankle   . Hiatal hernia   . HTN (hypertension)     all her life  . CKD (chronic kidney disease), stage III     GFR: 38  . Atrial fibrillation     since 1996  . Sleep apnea   . Fall 3 weeks ago    was evaluated at [facility]  . OSA on CPAP   . Depression   . PONV (postoperative nausea and vomiting)     difficult to wake up    Consultants: Ortho (Dr. Charlann Boxer), Pulm, [facility] (Cards)  Procedures: None yet  Subjective: Patient somnolent and mildly confused this morning. Continues to have pain in Legs. Did not wear CPAP/Bipap at night.    Objective: Vital signs in last 24 hours: Temp:  [97.4 F (36.3 C)-98.4 F (36.9 C)] 97.4 F (36.3 C) (10/28 0800) Pulse Rate:  [48-115] 59  (10/28 0600) Resp:  [8-20] 14  (10/28 0600) BP: (75-127)/(53-78) 109/56 mmHg (10/28 0600) SpO2:  [95 %-99 %] 96 % (10/28 0600) Weight:  [116.2 kg (256 lb 2.8 oz)] 116.2 kg (256 lb 2.8 oz) (10/28 0500) Weight change:  Last BM Date: 08/24/12 (per chart and pt)  Intake/Output from previous day: 10/27 0701 - 10/28 0700 In: 460 [I.V.:20; IV Piggyback:100] Out: 710 [Urine:710] Intake/Output this shift: Total I/O In: -  Out: 10  [Urine:10]  General appearance: Somnolent today. Arousable but confused. appears stated age, no distress, moderately obese Resp: decreased air entry at the bases. No definite crackles or wheezing Cardio: regular rate and rhythm, S1, S2 normal, no murmur, click, rub or gallop GI: soft, non-tender; bowel sounds normal; no masses,  no organomegaly Extremities: covered with braces. Edema is noted b/l LE. no erythema Pulses: good radial pulses, but poor pedal pulses Neurologic: No focal deficits. Some tremors noted. Somnolent.  Lab Results:  [facility] 08/31/12 0415 08/30/12 0510  WBC 9.4 8.9  HGB 8.5* 8.2*  HCT 27.7* 26.5*  PLT 141* 116*   BMET  Basename 08/31/12 0415 08/30/12 0510 08/29/12 0440  NA 135 136 --  K 4.6 4.4 --  CL 93* 93* --  CO2 34* 37* --  GLUCOSE 240* 217* --  BUN 74* 61* --  CREATININE 2.69* 2.43* --  CALCIUM 8.6 8.5 --  ALT -- -- 9    Studies/Results: US Renal Port  08/30/2012  *RADIOLOGY REPORT*  Clinical Data: Acute renal failure  RENAL/URINARY TRACT ULTRASOUND COMPLETE  Comparison: CT 05/22/2012  Findings:  Right Kidney = 10.3 cm.  No hydronephrosis or mass.  A small simple cyst in upper pole.  Left kidney = Not well visualized. Bladder:  Foley catheter placed in the  IMPRESSION:  Normal right kidney.  Left kidney is poorly assessed.   Original Report Authenticated By: Genevive Bi, M.D.    Dg Chest Port 1 View  08/30/2012  *RADIOLOGY REPORT*  Clinical Data: Fall pleural effusions  PORTABLE CHEST - 1 VIEW  Comparison: Chest radiograph 08/28/2012  Findings: Stable enlarged heart silhouette.  There is a right PICC line in place.  There is generalized central venous congestion and low lung volumes with small pleural effusions. Effusions are improved.  No pneumothorax.  IMPRESSION:  1.  Low lung volumes and pleural effusions with central venous congestion suggest congestive heart failure.  2.  Effusions are slightly improved.   Original Report Authenticated By:  Genevive Bi, M.D.     Medications:  Scheduled:    . antiseptic oral rinse  15 mL Mouth Rinse q12n4p  . chlorhexidine  15 mL Mouth Rinse BID  . diltiazem  90 mg Oral QID  . feeding supplement  30 mL Oral TID WC  . fluticasone  1 puff Inhalation BID  . furosemide  120 mg Intravenous Q8H  . gabapentin  300 mg Oral BID  . insulin aspart  0-15 Units Subcutaneous TID WC  . insulin glargine  8 Units Subcutaneous QHS  . isosorbide dinitrate  20 mg Oral TID  . levalbuterol  0.63 mg Nebulization BID  . metolazone  5 mg Oral Daily  . pantoprazole  40 mg Oral Daily  . sodium chloride  10-40 mL Intracatheter Q12H  . sotalol  80 mg Oral BID  . DISCONTD: bumetanide  1 mg Intravenous Q12H  . DISCONTD: levalbuterol  0.63 mg Nebulization Q6H  . DISCONTD: pantoprazole (PROTONIX) IV  40 mg Intravenous QHS   Continuous:  ZOX:[id number] sodium, feeding supplement, fentaNYL, HYDROcodone-acetaminophen, levalbuterol, metoprolol, ondansetron (ZOFRAN) IV, ondansetron, sodium chloride  Assessment/Plan:  Principal Problem:  *Multiple fractures of both lower extremities Active Problems:  DM  OBSTRUCTIVE SLEEP APNEA  GERD  Atrial fibrillation  HTN (hypertension)  COPD (chronic obstructive pulmonary disease)  Hyponatremia  CKD (chronic kidney disease)  Pleural effusion  Hypercapnemia  Acute respiratory failure   Multiple fractures of both lower extremities Seen by Ortho. They will discuss and if surgery is planned, it will be sometime this week. Pain control is an issue. Patient apparently becomes somnolent with Fentanyl. Try to avoid as much as possible. She had the same issue with Dilaudid and she is allergic to Morphine. On increased dose of oral agents. Continue Bedrest. Foley. May require prolonged ventilatory support if patient undergoes surgery. Will involve cardiology as well.   Acute respiratory failure Possibly from OSA, hypercapnea and narcotics. Has improved with Bipap. ABG showed  improvement as well. Bipap QHS and PRn during daytime.   Pleural Effusion/Fluid Overload Had normal systolic EF on recent ECHO. Has normal WBC. Infectious process is less likely. On high dose Lasix.   Acute on CKD Stage 3 Creatinine getting worse with inadequate urine output. Appreciate Nephrology input. Renal US report reviewed. Repeat CXR continues to show fluid overload. Baseline creatinine between 1.7-1.9. Potassium improved.    History of COPD and OSA Stable. See above. Started inhaled steroids. Will involve pulmonology. Did not wear her CPAP last night. Will need Bipap today during daytime. Have discussed with RT.  History of HTN  Continue to monitor BP. BP running low at times but she remains asymptomatic. Monitor for now without changes. Holding hydralazine. Continue other meds. Could hold Imdur as well if BP continues to be low. Cardiology to weigh in as well.  History of Atrial  fibrillation  Was tachycardic 1025 but did not appear to be afib. HR now stable. Cardizem dose was increased. Metoprolol as needed. Holding warfarin in anticipation of surgery. Due to recent TIA will like her back on anti-coagulation as soon as possible. Cardiology called.  Hyponatremia  Based on x ray findings and physical exam, suspect that this is secondary to hypervolemic hyponatremia from diastolic HF. Has improved.   Anemia Likely secondary to chronic disease. Was transfused. Monitor. May need more transfusions prior to surgery.  DM 2 SSI while patient in house. Increased dose of lantus 10/27. Will be cautious due to renal failure with worsening creatinine.  GERD  Protonix IV  Code Status Full Code  Family Communication Discussed with daughter at bedside  Disposition Plan Will likely need to go back to SNF when ready.   DVT Prophylaxis Had Therapeutic INR. INR now 1.8. If surgery is planned for 10/29 will resume anti-coagulation as soon as possible post operatively. If surgery is delayed  she will need Enoxaparin at DVT proph dose.   LOS: 4 days   [facility]  [facility] Pager [phone] 08/31/2012, 8:19 AM

## 2012-08-31 NOTE — Progress Notes (Signed)
Paged Dr Delano Metz, during return call informed him that pt has had only 40 cc out of urine since 2200. And no urine in the past two hours. Informed that a bladder scan was completed and foley was flushed with no results. MD wants RN to administer the next dose of lasix as scheduled. Will continue to monitor. Pt is arousable and able to follow simple commands.

## 2012-08-31 NOTE — Consult Note (Addendum)
Reason for Consult:surgical clearance   Referring Physician: [facility]   Gina Ashley is an 78 y.o. female.   Cardiologist: Dr. Royann Shivers  Chief Complaint:  Leg pain after a fall, admitted 08/27/12  HPI: Gina Ashley is a 78 y.o. Female,  from [facility], with an unwitnessed fall around 400 am on 08/27/12. Reportedly she had recently been at [facility] place due to recent deconditioning. After the fall patient subsequently developed lower extremity discomfort and was subsequently taken to [facility] ED for further evaluation. Pt does not know what happened.  History was obtained from daughters in the room and ED records as patient was given dilaudid for BL LE discomfort and subsequently was sleepy per family. In the room initially ortho technician was at bedside placing braces and patient had discomfort with manipulation of lower extremities while placing gauze.  Ortho consult was obtained with Dr. Victorino Dike, found R periprosthetic supracondylar femur fracture and left periprosthetic proximal tibia fracture. Dr. Victorino Dike felt "These are severe injuries in what appears to be osteoporotic bone in a patient with multiple medical comorbidities. They represent a high risk of complications regardless of the treatment approach."   Current plan is for surgical correction in the AM.  Additionally Nephrology consult was obtained for acute on chronic RF.  She was marked vol. Overload by CXR with CHF.  She was also in A. Fib, and anticoagulation with coumadin.    CCM consulted today secondary to to acute hypercarbic resp. Failure, superimposed on underlying OSA and CRF.  We have been consulted for pre-operative management.   Cardiac history includes diastolic HF with acute episode In April of this year. She has hx of both rt. And lt. Heart failure.  She has paroxysmal atrial fibrillation on sotalol and Coumadin, right heart failure probably related to obesity, severe systemic hypertension, diabetes and dyslipidemia.  She has also been diagnosed with COPD and was on continuous oxygen therapy at home -- was noted to be hypoxic & hypercapneic on ABG. This diagnosis came, despite her being a non-smoker of > 60 years. On her August visit with Dr. Royann Shivers she was in sinus rhythm with generalized low QRS voltage due to obesity. 1st first-degree AV block with PR interval of 240 ms the QT interval was normal at about 440 ms.   Rt. Heart cath was recommended if she had further heart failure.  Historically she has a narrow window between hypovolemia and pulmonary edema.   Dr. Royann Shivers seemed to agree with Dr. Ulyses Jarred (PCCM) assessment of obesity hypoventilation syndrome.  Current Cr. 2.69 was 2.21 on admit.  H/H 8.4/27 today and INR of 1.88  She is on 160 mg lasix every 8 hours.  Past Medical History  Diagnosis Date  . Diabetes mellitus     x 15 yrs  . GERD (gastroesophageal reflux disease)   . Back pain, chronic   . Pain in shoulder   . Pain in ankle   . Hiatal hernia   . HTN (hypertension)     all her life  . CKD (chronic kidney disease), stage III     GFR: 38  . Atrial fibrillation     since 1996  . Sleep apnea   . Fall 3 weeks ago    was evaluated at [facility]  . OSA on CPAP   . Depression   . PONV (postoperative nausea and vomiting)     difficult to wake up  . Chronic diastolic heart failure 08/31/2012    Past Surgical History  Procedure Date  . Bunionectomy     bilateral  . Cholecystectomy   . Rotator cuff repair     2002  . Total knee arthroplasty     bilateral 2001  . Hammer toe surgery     2004  . Colonoscopy 07/11/2011    Procedure: COLONOSCOPY;  Surgeon: Malissa Hippo, MD;  Location: [facility]ITE;  Service: Endoscopy;  Laterality: N/A;  3:00    Family History  Problem Relation Age of Onset  . Colon cancer Brother    Social History:  reports that she has never smoked. She has never used smokeless tobacco. She reports that she does not drink alcohol or use illicit  drugs.  Allergies:  Allergies  Allergen Reactions  . Morphine Sulfate Other (See Comments)    REACTION: change in personality  . Percocet (Oxycodone-Acetaminophen) Other (See Comments)    Does not relieve the pain  . Remeron (Mirtazapine) Other (See Comments)    Altered mental status, lethargy  . Tuna (Fish Allergy)   . Penicillins Rash    Medications Prior to Admission  Medication Sig Dispense Refill  . allopurinol (ZYLOPRIM) 300 MG tablet Take 150 mg by mouth daily.      Marland Kitchen diltiazem (CARDIZEM) 60 MG tablet Take 60 mg by mouth 4 (four) times daily.      Marland Kitchen docusate sodium (COLACE) 100 MG capsule Take 100 mg by mouth 2 (two) times daily as needed. constipation      . gabapentin (NEURONTIN) 300 MG capsule Take 300 mg by mouth 3 (three) times daily.       . hydrALAZINE (APRESOLINE) 50 MG tablet Take 50 mg by mouth 3 (three) times daily.      Marland Kitchen HYDROcodone-acetaminophen (NORCO/VICODIN) 5-325 MG per tablet Take 2 tablets by mouth every 6 (six) hours as needed. pain      . insulin aspart (NOVOLOG) 100 UNIT/ML injection Inject 5 Units into the skin 3 (three) times daily before meals. cbg>150      . insulin glargine (LANTUS) 100 UNIT/ML injection Inject 5 Units into the skin at bedtime.      . iron polysaccharides (NIFEREX) 150 MG capsule Take 150 mg by mouth daily.      . isosorbide dinitrate (ISORDIL) 20 MG tablet Take 20 mg by mouth 3 (three) times daily.      . pantoprazole (PROTONIX) 40 MG tablet Take 40 mg by mouth daily.      . polyethylene glycol (MIRALAX / GLYCOLAX) packet Take 17 g by mouth daily.      . sotalol (BETAPACE) 80 MG tablet Take 80 mg by mouth 2 (two) times daily.      Marland Kitchen torsemide (DEMADEX) 5 MG tablet Take 5 mg by mouth daily.      Marland Kitchen warfarin (COUMADIN) 2.5 MG tablet Take 2.5 mg by mouth daily. Take with 3 mg      . warfarin (COUMADIN) 3 MG tablet Take 3 mg by mouth daily. Take with 2.5 mg        Results for orders placed during the hospital encounter of 08/27/12  (from the past 48 hour(s))  GLUCOSE, CAPILLARY     Status: Abnormal   Collection Time   08/29/12  4:57 PM      Component Value Range Comment   Glucose-Capillary 249 (*) 70 - 99 mg/dL    Comment 1 Documented in Chart      Comment 2 Notify RN     GLUCOSE, CAPILLARY     Status:  Abnormal   Collection Time   08/29/12  9:32 PM      Component Value Range Comment   Glucose-Capillary 187 (*) 70 - 99 mg/dL   PROTIME-INR     Status: Abnormal   Collection Time   08/30/12  5:10 AM      Component Value Range Comment   Prothrombin Time 24.7 (*) 11.6 - 15.2 seconds    INR 2.35 (*) 0.00 - 1.49   CBC     Status: Abnormal   Collection Time   08/30/12  5:10 AM      Component Value Range Comment   WBC 8.9  4.0 - 10.5 K/uL    RBC 3.04 (*) 3.87 - 5.11 MIL/uL    Hemoglobin 8.2 (*) 12.0 - 15.0 g/dL    HCT 16.1 (*) 09.6 - 46.0 %    MCV 87.2  78.0 - 100.0 fL    MCH 27.0  26.0 - 34.0 pg    MCHC 30.9  30.0 - 36.0 g/dL    RDW 04.5 (*) 40.9 - 15.5 %    Platelets 116 (*) 150 - 400 K/uL CONSISTENT WITH PREVIOUS RESULT  BASIC METABOLIC PANEL     Status: Abnormal   Collection Time   08/30/12  5:10 AM      Component Value Range Comment   Sodium 136  135 - 145 mEq/L    Potassium 4.4  3.5 - 5.1 mEq/L    Chloride 93 (*) 96 - 112 mEq/L    CO2 37 (*) 19 - 32 mEq/L    Glucose, Bld 217 (*) 70 - 99 mg/dL    BUN 61 (*) 6 - 23 mg/dL    Creatinine, Ser 8.11 (*) 0.50 - 1.10 mg/dL    Calcium 8.5  8.4 - 91.4 mg/dL    GFR calc non Af Amer 17 (*) >90 mL/min    GFR calc Af Amer 20 (*) >90 mL/min   PHOSPHORUS     Status: Abnormal   Collection Time   08/30/12  5:10 AM      Component Value Range Comment   Phosphorus 5.2 (*) 2.3 - 4.6 mg/dL   GLUCOSE, CAPILLARY     Status: Abnormal   Collection Time   08/30/12  7:47 AM      Component Value Range Comment   Glucose-Capillary 198 (*) 70 - 99 mg/dL    Comment 1 Documented in Chart      Comment 2 Notify RN     GLUCOSE, CAPILLARY     Status: Abnormal   Collection Time    08/30/12 11:18 AM      Component Value Range Comment   Glucose-Capillary 258 (*) 70 - 99 mg/dL   GLUCOSE, CAPILLARY     Status: Abnormal   Collection Time   08/30/12  3:48 PM      Component Value Range Comment   Glucose-Capillary 210 (*) 70 - 99 mg/dL    Comment 1 Documented in Chart      Comment 2 Notify RN     GLUCOSE, CAPILLARY     Status: Abnormal   Collection Time   08/30/12  9:40 PM      Component Value Range Comment   Glucose-Capillary 232 (*) 70 - 99 mg/dL   SODIUM, URINE, RANDOM     Status: Normal   Collection Time   08/30/12  9:42 PM      Component Value Range Comment   Sodium, Ur 9     CREATININE, URINE, RANDOM  Status: Normal   Collection Time   08/30/12  9:42 PM      Component Value Range Comment   Creatinine, Urine 162.3     PROTIME-INR     Status: Abnormal   Collection Time   08/31/12  4:15 AM      Component Value Range Comment   Prothrombin Time 20.9 (*) 11.6 - 15.2 seconds    INR 1.88 (*) 0.00 - 1.49   CBC     Status: Abnormal   Collection Time   08/31/12  4:15 AM      Component Value Range Comment   WBC 9.4  4.0 - 10.5 K/uL    RBC 3.17 (*) 3.87 - 5.11 MIL/uL    Hemoglobin 8.5 (*) 12.0 - 15.0 g/dL    HCT 16.1 (*) 09.6 - 46.0 %    MCV 87.4  78.0 - 100.0 fL    MCH 26.8  26.0 - 34.0 pg    MCHC 30.7  30.0 - 36.0 g/dL    RDW 04.5 (*) 40.9 - 15.5 %    Platelets 141 (*) 150 - 400 K/uL   RENAL FUNCTION PANEL     Status: Abnormal   Collection Time   08/31/12  4:15 AM      Component Value Range Comment   Sodium 135  135 - 145 mEq/L    Potassium 4.6  3.5 - 5.1 mEq/L    Chloride 93 (*) 96 - 112 mEq/L    CO2 34 (*) 19 - 32 mEq/L    Glucose, Bld 240 (*) 70 - 99 mg/dL    BUN 74 (*) 6 - 23 mg/dL    Creatinine, Ser 8.11 (*) 0.50 - 1.10 mg/dL    Calcium 8.6  8.4 - 91.4 mg/dL    Phosphorus 5.8 (*) 2.3 - 4.6 mg/dL    Albumin 2.9 (*) 3.5 - 5.2 g/dL    GFR calc non Af Amer 15 (*) >90 mL/min    GFR calc Af Amer 18 (*) >90 mL/min   PROCALCITONIN     Status:  Normal   Collection Time   08/31/12  4:15 AM      Component Value Range Comment   Procalcitonin <0.10     GLUCOSE, CAPILLARY     Status: Abnormal   Collection Time   08/31/12  7:41 AM      Component Value Range Comment   Glucose-Capillary 209 (*) 70 - 99 mg/dL    Comment 1 Documented in Chart      Comment 2 Notify RN     BLOOD GAS, ARTERIAL     Status: Abnormal   Collection Time   08/31/12  9:38 AM      Component Value Range Comment   O2 Content 2.0      Delivery systems NASAL CANNULA      pH, Arterial 7.312 (*) 7.350 - 7.450    pCO2 arterial 69.6 (*) 35.0 - 45.0 mmHg    pO2, Arterial 89.7  80.0 - 100.0 mmHg    Bicarbonate 34.2 (*) 20.0 - 24.0 mEq/L    TCO2 32.8  0 - 100 mmol/L    Acid-Base Excess 7.0 (*) 0.0 - 2.0 mmol/L    O2 Saturation 97.5      Patient temperature 98.6      Collection site LEFT RADIAL      Drawn by [id number]      Sample type ARTERIAL DRAW      Allens test (pass/fail) PASS  PASS  US Renal Port  08/30/2012  *RADIOLOGY REPORT*  Clinical Data: Acute renal failure  RENAL/URINARY TRACT ULTRASOUND COMPLETE  Comparison: CT 05/22/2012  Findings:  Right Kidney = 10.3 cm.  No hydronephrosis or mass.  A small simple cyst in upper pole.  Left kidney = Not well visualized. Bladder:  Foley catheter placed in the  IMPRESSION:  Normal right kidney.  Left kidney is poorly assessed.   Original Report Authenticated By: Genevive Bi, M.D.    Dg Chest Port 1 View  08/30/2012  *RADIOLOGY REPORT*  Clinical Data: Fall pleural effusions  PORTABLE CHEST - 1 VIEW  Comparison: Chest radiograph 08/28/2012  Findings: Stable enlarged heart silhouette.  There is a right PICC line in place.  There is generalized central venous congestion and low lung volumes with small pleural effusions. Effusions are improved.  No pneumothorax.  IMPRESSION:  1.  Low lung volumes and pleural effusions with central venous congestion suggest congestive heart failure.  2.  Effusions are slightly improved.    Original Report Authenticated By: Genevive Bi, M.D.    2D Echo:  08/24/12  - Left ventricle: The cavity size was normal. Systolic function was normal. The estimated ejection fraction was in the range of 55% to 60%. Wall motion was normal; there were no regional wall motion abnormalities. The study is not technically sufficient to allow evaluation of LV diastolic function. - Mitral valve: Moderately to severely calcified annulus. Mild regurgitation. - Left atrium: The atrium was mildly dilated. - Right atrium: The atrium was moderately dilated. - Pulmonary arteries: Systolic pressure was moderately increased. PA peak pressure: 56mm Hg (S).  TELE: Afib CVR, 70s  ROS:  General:no colds or Fevers  Skin:No rashes or ulcers HEENT:No blurred vision or double vision CV:No chest pain PUL:no specific shortness of breath GI:no diarrhea constipation or melena GU:No hematuria MS:Was having difficulty walking and not ambulating by herself before these fractures Neuro:Denies syncope Endo:History of diabetes  Blood pressure 126/58, pulse 71, temperature 98.1 F (36.7 C), temperature source Axillary, resp. rate 12, height 5\' 3"  (1.6 m), weight 116.2 kg (256 lb 2.8 oz), SpO2 99.00%. PE: General:Drowsy but awakes, oriented to person place and time, Pleasant affect Skin:Warm and dry, brisk capillary refill HEENT:Normocephalic sclera clear Neck:Supple no JVD no bruits Heart:S1-S2 irregularly irregular Lungs:Anteriorly sounding fairly clear no wheezes noted ZOX:WRUEA soft nontender positive bowel sounds VWU:JWJXBJYN edema of her feet she has braces on both legs Neuro:Drowsy but able to explain what she remembers though this is very little   Assessment/Plan Principal Problem:  *Multiple fractures of both lower extremities Active Problems:  DM  OBSTRUCTIVE SLEEP APNEA  GERD  PAF (paroxysmal atrial fibrillation), was in SR 06/2012   HTN (hypertension)  COPD (chronic obstructive pulmonary  disease)  Hyponatremia  CKD (chronic kidney disease)  Pleural effusion  Hypercapnemia  Acute respiratory failure  Chronic diastolic heart failure  PLAN:Agree with high risk of any treatment plan.   Receiving IV lasix. MD to see for further eval. INGOLD,LAURA R 08/31/2012, 3:39 PM  I have seen & examined the patient along with Ms. Ingold.  I agree with her extensive history & physical.  The patient is a very complicated patient with long-standing HTN, pAFib (on Sotalol & warfarin), along with RHF thought to be related to obesity hypoventilation (OHS) along with diastolic dysfunction.  She has suffered a fall with bilateral LE fractures.    She is currently in Afib with CVR on Sotalol & CCB (CCB may also be helping her underlying  Pulmonary HTN).   Agree with holding Warfarin, to restart when safe post-op.  Continue with Sotalol & CCB for now -- if rate increases, may need to increase sotalol dose   She had acute hypercapneic respiratory failure this AM in part due to Narcotic related sedation exacerbating her underlying hypercapnia.  CXR & Nephrology c/s suggested volume overload (her reported "dry wgt" per Dr. Erin Hearing last note on 06/26/12 was 221lb - currently 256lb).    I agree with diuresis with Nephrology guidance -- suspect that this may improve her renal function.    She is on a relatively high dose of ACE-I @ home -- held due to renal insufficiency, would consider using Hydralazine / nitrate for afterload reduction.   Her RFs based upon the Revised Cardiac Risk Index for Non-cardiac surgery -- + DM on insulin, active HF (mostly R sided due to OHS), + Renal insufficiency with Cr > 2.0; Negative for H/o Cerebrovascular disease or active angina as sign of CAD (no report of invasive or non-invasive evaluation that I can find), Orthopedic (non-intraperitoneal, non-vascular surgery) is considered to be low CV risk.  3+ RFs = High Risk (>11%) estimated risk for adverse outcome (Fatal  or Non-fatal MI, PE, VF/VT or 3rd Deg HB cardaic arrest) from Non-cardiac Surgery  Sotalol as a BB class of medication does mitigate this some, but with her underlying obesity & Obesity hypoventilation syndrome, not much.  She is going to be a High Risk patient for a Non-High Risk Surgery -- she will need close monitoring & CCM input post-operatively.  Would prefer to improve her Ventilation status as well as volume status with diuresis pre-operatively to reduce risk.-- agree with Nephrology recommendations re: diureisis & renal protection (unfortunately, has not diuresed much).  May benefit from RHC intraoperatively for closer monitoring.  Would consider Epidural vs spinal anesthesia if possible to avoid GA.  I do not think that she would benefit from additional cardiac evaluation prior to proceeding with her planned Orthopedic operation.  We will be happy to follow along.  Marykay Lex, M.D., M.S. [facility] [address] [place], [place]  [address]  [phone] Pager # [phone] 08/31/2012 5:05 PM

## 2012-08-31 NOTE — Progress Notes (Signed)
Pt had CPAP for about 2hrs, pt took CPAP off and refused to have it back on.

## 2012-08-31 NOTE — Progress Notes (Signed)
Patient ID: Gina Ashley, female   DOB: September 22, 1927, 78 y.o.   MRN: [id number] S:pt wearing bipap, still in pain O:BP 124/78  Pulse 64  Temp 97.4 F (36.3 C) (Oral)  Resp 12  Ht 5\' 3"  (1.6 m)  Wt 116.2 kg (256 lb 2.8 oz)  BMI 45.38 kg/m2  SpO2 95%  Intake/Output Summary (Last 24 hours) at 08/31/12 1120 Last data filed at 08/31/12 1000  Gross per 24 hour  Intake    560 ml  Output    705 ml  Net   -145 ml   Intake/Output: I/O last 3 completed shifts: In: 700 [I.V.:20; Other:580; IV Piggyback:100] Out: 975 [Urine:975]  Intake/Output this shift:  Total I/O In: 180 [Other:180] Out: 170 [Urine:170] Weight change:  NWG:NFAOZ, AAF wearing BiPap, lethargic but arousable/verbal CVS:IRR IRR, no rub Resp:decreased BS HYQ:MVHQIONGE/XBMWU, nontender Ext:+3 edema/anasarca   Lab 08/31/12 0415 08/30/12 0510 08/29/12 0440 08/28/12 1135 08/28/12 0545 08/27/12 2110 08/27/12 1650 08/25/12 0455  NA 135 136 136 135 132* -- 133* 135  K 4.6 4.4 4.4 5.6* 5.5* -- 5.1 4.8  CL 93* 93* 94* 94* 92* -- 93* 93*  CO2 34* 37* 37* 36* 35* -- 37* 37*  GLUCOSE 240* 217* 178* 209* 178* -- 214* 158*  BUN 74* 61* 49* 51* 50* -- 46* 50*  CREATININE 2.69* 2.43* 1.94* 2.21* 2.02* -- 1.72* 1.79*  ALBUMIN 2.9* -- 3.1* -- -- -- -- --  CALCIUM 8.6 8.5 8.5 9.1 9.1 -- 9.0 9.2  PHOS 5.8* 5.2* -- -- -- 4.7* -- --  AST -- -- 13 -- -- -- -- --  ALT -- -- 9 -- -- -- -- --   Liver Function Tests:  Lab 08/31/12 0415 08/29/12 0440  AST -- 13  ALT -- 9  ALKPHOS -- 91  BILITOT -- 0.7  PROT -- 6.0  ALBUMIN 2.9* 3.1*   No results found for this basename: LIPASE:3,AMYLASE:3 in the last 168 hours No results found for this basename: AMMONIA:3 in the last 168 hours CBC:  Lab 08/31/12 0415 08/30/12 0510 08/29/12 0440 08/28/12 0545 08/27/12 1650  WBC 9.4 8.9 8.2 -- --  NEUTROABS -- -- -- -- 6.2  HGB 8.5* 8.2* 8.4* -- --  HCT 27.7* 26.5* 27.0* -- --  MCV 87.4 87.2 86.0 86.1 85.0  PLT 141* 116* 100* -- --   Cardiac  Enzymes: No results found for this basename: CKTOTAL:5,CKMB:5,CKMBINDEX:5,TROPONINI:5 in the last 168 hours CBG:  Lab 08/31/12 0741 08/30/12 2140 08/30/12 1548 08/30/12 1118 08/30/12 0747  GLUCAP 209* 232* 210* 258* 198*    Iron Studies: No results found for this basename: IRON,TIBC,TRANSFERRIN,FERRITIN in the last 72 hours Studies/Results: US Renal Port  08/30/2012  *RADIOLOGY REPORT*  Clinical Data: Acute renal failure  RENAL/URINARY TRACT ULTRASOUND COMPLETE  Comparison: CT 05/22/2012  Findings:  Right Kidney = 10.3 cm.  No hydronephrosis or mass.  A small simple cyst in upper pole.  Left kidney = Not well visualized. Bladder:  Foley catheter placed in the  IMPRESSION:  Normal right kidney.  Left kidney is poorly assessed.   Original Report Authenticated By: Genevive Bi, M.D.    Dg Chest Port 1 View  08/30/2012  *RADIOLOGY REPORT*  Clinical Data: Fall pleural effusions  PORTABLE CHEST - 1 VIEW  Comparison: Chest radiograph 08/28/2012  Findings: Stable enlarged heart silhouette.  There is a right PICC line in place.  There is generalized central venous congestion and low lung volumes with small pleural effusions. Effusions are improved.  No pneumothorax.  IMPRESSION:  1.  Low lung volumes and pleural effusions with central venous congestion suggest congestive heart failure.  2.  Effusions are slightly improved.   Original Report Authenticated By: Genevive Bi, M.D.       . antiseptic oral rinse  15 mL Mouth Rinse q12n4p  . chlorhexidine  15 mL Mouth Rinse BID  . diltiazem  90 mg Oral QID  . feeding supplement  30 mL Oral TID WC  . fluticasone  1 puff Inhalation BID  . furosemide  120 mg Intravenous Q8H  . gabapentin  300 mg Oral BID  . insulin aspart  0-15 Units Subcutaneous TID WC  . insulin glargine  8 Units Subcutaneous QHS  . isosorbide dinitrate  20 mg Oral TID  . levalbuterol  0.63 mg Nebulization BID  . metolazone  5 mg Oral Daily  . pantoprazole  40 mg Oral Daily  .  sodium chloride  10-40 mL Intracatheter Q12H  . sotalol  80 mg Oral BID  . DISCONTD: bumetanide  1 mg Intravenous Q12H  . DISCONTD: levalbuterol  0.63 mg Nebulization Q6H    BMET    Component Value Date/Time   NA 135 08/31/2012 0415   K 4.6 08/31/2012 0415   CL 93* 08/31/2012 0415   CO2 34* 08/31/2012 0415   GLUCOSE 240* 08/31/2012 0415   BUN 74* 08/31/2012 0415   CREATININE 2.69* 08/31/2012 0415   CALCIUM 8.6 08/31/2012 0415   GFRNONAA 15* 08/31/2012 0415   GFRAA 18* 08/31/2012 0415   CBC    Component Value Date/Time   WBC 9.4 08/31/2012 0415   RBC 3.17* 08/31/2012 0415   HGB 8.5* 08/31/2012 0415   HCT 27.7* 08/31/2012 0415   PLT 141* 08/31/2012 0415   MCV 87.4 08/31/2012 0415   MCH 26.8 08/31/2012 0415   MCHC 30.7 08/31/2012 0415   RDW 16.8* 08/31/2012 0415   LYMPHSABS 0.8 08/27/2012 1650   MONOABS 0.6 08/27/2012 1650   EOSABS 0.2 08/27/2012 1650   BASOSABS 0.0 08/27/2012 1650     Assessment/Plan:  1. AKI/CKD- pt was oliguric now improving with change to IV lasix and zaroxolyn.  Pt has advanced CKD stage IV at baseline, now with decompensated RHF, morbid obesity and bilateral leg fractures.  Pt is a poor candidate for HD given advanced age, morbid obesity, multiple irreversible chronic medical problems, none of which would improve with RRT, nor her overal QOL.  I discussed this with the patient and her daughter.  All are in agreement not to attempt RRT.  Will try to maximize volume status as well as pulm status and hopefully stabilize renal function prior to proceeding with surgery.  Will discuss with Ortho.  Need to avoid NSAIDs/COX-II Inhibitors/IV contrast material 2. Hypercapnic respiratory failure/Obesity-hypoventilation syndrome- on BiPap per PCCM 3. CHF- mainly right sided.  Will cont to increase lasix given significant anasarca. 4. ABLA/ACDz- follow H/H and transfuse prn.  Consider EPO 5. A fib- rate-controlled 6. Hip fractures- as above.  Would hold off on  surgery at this time given decompensated CHF/hypercapnea and AKI/CKD 7. HTN- stable 8. Hyponatremia- due to CHF improving with diuresis 9. DM- poorly controlled, defer to primary svc 10. Dispo- poor QOL with multiple comorbidities.  Will likely return to SNF once stable vs LTCF if she improves.  Chaynce Schafer A

## 2012-08-31 NOTE — Progress Notes (Signed)
CARE MANAGEMENT NOTE 08/31/2012  Patient:  [facility]   Account Number:  [id number]  Date Initiated:  08/28/2012  Documentation initiated by:  DAVIS,RHONDA  Subjective/Objective Assessment:   pt fell at snf fx rt upper tibia inloving the tkr hardward and the left tib-fib cxr show lupl. effusions.     Action/Plan:   snf   Anticipated DC Date:  08/31/2012   Anticipated DC Plan:  SKILLED NURSING FACILITY  In-house referral  NA      DC Planning Services  NA      [facility] Choice  NA   Choice offered to / List presented to:  NA   DME arranged  NA      DME agency  NA     HH arranged  NA      HH agency  NA   Status of service:  In process, will continue to follow Medicare Important Message given?  NA - LOS <3 / Initial given by admissions (If response is "NO", the following Medicare IM given date fields will be blank) Date Medicare IM given:   Date Additional Medicare IM given:    Discharge Disposition:    Per UR Regulation:  Reviewed for med. necessity/level of care/duration of stay  If discussed at Long Length of Stay Meetings, dates discussed:    Comments:  [id number] Earlene Plater, RN, BSN, CCM: CHART REVIEWED AND UPDATED. Patient is for O.R. today to correct fractures of the lower legs bilaterally NO DISCHARGE NEEDS PRESENT AT THIS TIME. CASE MANAGEMENT [phone]  [id number]/Rhonda Davis,RN,BSn,CCM

## 2012-08-31 NOTE — Progress Notes (Signed)
Patient ID: Gina Ashley, female   DOB: 1927-10-14, 78 y.o.   MRN: [id number]  Comfortable in bed with daughter at bedside  Bilateral knee immobilizers in place, RLE externally rotated  INR1.88  Reviewed with patient and her daughter plans for surgical correction of bilateral lower extremities NPO after MN tonight Consent order will be placed If any medical contraindications arise please notify us and we will postpone further

## 2012-08-31 NOTE — Consult Note (Signed)
Name: Gina Ashley MRN: [id number] DOB: [birth year]    LOS: 4  Referring Provider:  Rito Ehrlich Reason for Referral:  AMS  PULMONARY / CRITICAL CARE MEDICINE  HPI:   This is a 78 year old female w/ PMH CKD stage IV, DM, AF, sleep apnea and HTN. Admitted on 10/24 s/p an unwitnessed fall resulting in Right distal periprosthetic fracture, and left proximal tibial periprosthetic fracture. She was admitted by the medical service due to her multiple medical co-morbids. Seen by nephrology for acute on chronic renal failure, Scr 2.4 (baseline 1.5-1.9) Felt to be grossly volume overloaded (recently at [facility] for 2 weeks due to renal failure). Seen by Ortho who requested medical stabilization and reversal of anticoagulation prior to surgical intervention. Her course had been complicated by: decreased LOC w/ acute encephalopathy, felt to be aggravated by Narcotics superimposed on OSA. PCCM asked to see on 10/28 to make recommendations on pulmonary needs for OSA, and presumed hypoventilation.   Past Medical History  Diagnosis Date  . Diabetes mellitus     x 15 yrs  . GERD (gastroesophageal reflux disease)   . Back pain, chronic   . Pain in shoulder   . Pain in ankle   . Hiatal hernia   . HTN (hypertension)     all her life  . CKD (chronic kidney disease), stage III     GFR: 38  . Atrial fibrillation     since 1996  . Sleep apnea   . Fall 3 weeks ago    was evaluated at [facility]  . OSA on CPAP   . Depression   . PONV (postoperative nausea and vomiting)     difficult to wake up   Past Surgical History  Procedure Date  . Bunionectomy     bilateral  . Cholecystectomy   . Rotator cuff repair     2002  . Total knee arthroplasty     bilateral 2001  . Hammer toe surgery     2004  . Colonoscopy 07/11/2011    Procedure: COLONOSCOPY;  Surgeon: Malissa Hippo, MD;  Location: [facility]ITE;  Service: Endoscopy;  Laterality: N/A;  3:00   Prior to Admission medications   Medication Sig Start  Date End Date Taking? Authorizing Provider  allopurinol (ZYLOPRIM) 300 MG tablet Take 150 mg by mouth daily.   Yes Historical Provider, MD  diltiazem (CARDIZEM) 60 MG tablet Take 60 mg by mouth 4 (four) times daily.   Yes Historical Provider, MD  docusate sodium (COLACE) 100 MG capsule Take 100 mg by mouth 2 (two) times daily as needed. constipation   Yes Historical Provider, MD  gabapentin (NEURONTIN) 300 MG capsule Take 300 mg by mouth 3 (three) times daily.    Yes Historical Provider, MD  hydrALAZINE (APRESOLINE) 50 MG tablet Take 50 mg by mouth 3 (three) times daily.   Yes Historical Provider, MD  HYDROcodone-acetaminophen (NORCO/VICODIN) 5-325 MG per tablet Take 2 tablets by mouth every 6 (six) hours as needed. pain   Yes Historical Provider, MD  insulin aspart (NOVOLOG) 100 UNIT/ML injection Inject 5 Units into the skin 3 (three) times daily before meals. cbg>150   Yes Historical Provider, MD  insulin glargine (LANTUS) 100 UNIT/ML injection Inject 5 Units into the skin at bedtime.   Yes Historical Provider, MD  iron polysaccharides (NIFEREX) 150 MG capsule Take 150 mg by mouth daily.   Yes Historical Provider, MD  isosorbide dinitrate (ISORDIL) 20 MG tablet Take 20 mg by mouth 3 (three)  times daily.   Yes Historical Provider, MD  pantoprazole (PROTONIX) 40 MG tablet Take 40 mg by mouth daily.   Yes Historical Provider, MD  polyethylene glycol (MIRALAX / GLYCOLAX) packet Take 17 g by mouth daily.   Yes Historical Provider, MD  sotalol (BETAPACE) 80 MG tablet Take 80 mg by mouth 2 (two) times daily.   Yes Historical Provider, MD  torsemide (DEMADEX) 5 MG tablet Take 5 mg by mouth daily.   Yes Historical Provider, MD  warfarin (COUMADIN) 2.5 MG tablet Take 2.5 mg by mouth daily. Take with 3 mg   Yes Historical Provider, MD  warfarin (COUMADIN) 3 MG tablet Take 3 mg by mouth daily. Take with 2.5 mg   Yes Historical Provider, MD   Allergies Allergies  Allergen Reactions  . Morphine Sulfate  Other (See Comments)    REACTION: change in personality  . Percocet (Oxycodone-Acetaminophen) Other (See Comments)    Does not relieve the pain  . Remeron (Mirtazapine) Other (See Comments)    Altered mental status, lethargy  . Tuna (Fish Allergy)   . Penicillins Rash    Family History Family History  Problem Relation Age of Onset  . Colon cancer Brother    Social History  reports that she has never smoked. She has never used smokeless tobacco. She reports that she does not drink alcohol or use illicit drugs.  Review Of Systems:   Unable due to AMS  Brief patient description:  This is a 78 year old female w/ multiple co-morbids including: CRF stage IV, AF on coumadin, OSA (wears CPAP at HS). Recently transferred to SNF after ~ 2 week stay at [facility] for acute on chronic renal failure. Had unwitnessed fall on 10/24 resulting in BLE fractures. Admitted by medical service rx has included pain management, IV lasix gtt for volume overload and ortho consult. PCCM asked to see on 10/28 for AMS and concern about hypercarbia.   Events Since Admission: Wakes up, but confused.   Current Status: gaurded  Vital Signs: Temp:  [97.4 F (36.3 C)-98.4 F (36.9 C)] 97.4 F (36.3 C) (10/28 0800) Pulse Rate:  [48-115] 59  (10/28 0600) Resp:  [8-20] 14  (10/28 0600) BP: (75-127)/(53-78) 109/56 mmHg (10/28 0600) SpO2:  [95 %-99 %] 96 % (10/28 0600) Weight:  [116.2 kg (256 lb 2.8 oz)] 116.2 kg (256 lb 2.8 oz) (10/28 0500)  Physical Examination: General:  Chronically ill appearing 78 year old female. Slow to awaken, sp slurred. Follows commands. Remains confused.  Neuro:  Awake, oriented X 1. Denies pain.  HEENT:  + JVD, MM dry Cardiovascular:  Regular irregular  Lungs:  Decreased t/o Abdomen:  No OM, + bowel sounds  Musculoskeletal:  LE pain Skin:  + anasarca   Principal Problem:  *Multiple fractures of both lower extremities Active Problems:  DM  OBSTRUCTIVE SLEEP APNEA  GERD   Atrial fibrillation  HTN (hypertension)  COPD (chronic obstructive pulmonary disease)  Hyponatremia  CKD (chronic kidney disease)  Pleural effusion  Hypercapnemia  Acute respiratory failure   ASSESSMENT AND PLAN  PULMONARY  Lab 08/28/12 1615 08/28/12 0844  PHART 7.385 7.262*  PCO2ART 60.5* 79.8*  PO2ART 141.0* 77.1*  HCO3 35.4* 34.8*  O2SAT 99.8 95.3   Ventilator Settings:   CXR:  Bilateral airspace disease, R>L. C/W edema and low volume.  ETT:    A:   Acute Hypercarbic Respiratory Failure: in setting of narcotic effect superimposed on underlying OSA and CRF Bilateral atelectasis Pulmonary edema/volume excess Think that  the biggest issue is balancing narcotics in this pt, closely followed by optimizing her fluid balance. She appears grossly volume overloaded.  P:   Check ABG, if hypercarbic will transition to BIPAP instead of CPAP Try to find the smallest, most effective dose for analgesia Continue diuresis F/u cxr Check PCT  CARDIOVASCULAR No results found for this basename: TROPONINI:5,LATICACIDVEN:5, O2SATVEN:5,PROBNP:5 in the last 168 hours ECG:  afib Lines: ECHO 10/21: estimated ejection fraction was in the range of 55% to 60%. Wall motion was normal; there were no regional wall motion abnormalities. The study is not technically sufficient to allow evaluation of LV diastolic function. A:  AFIB HTN Pulmonary edema  Both fib and HTN controlled. Think that edema more a component of renal failure, and perhaps element of diastolic dysfunction.  P:  Cont tele monitoring Cont lasix gtt F/u CXR BIPAP should also help w/ edema   RENAL  Lab 08/31/12 0415 08/30/12 0510 08/29/12 0440 08/28/12 1135 08/28/12 0545 08/27/12 2110  NA 135 136 136 135 132* --  K 4.6 4.4 -- -- -- --  CL 93* 93* 94* 94* 92* --  CO2 34* 37* 37* 36* 35* --  BUN 74* 61* 49* 51* 50* --  CREATININE 2.69* 2.43* 1.94* 2.21* 2.02* --  CALCIUM 8.6 8.5 8.5 9.1 9.1 --  MG -- -- -- -- -- 2.3    PHOS 5.8* 5.2* -- -- -- 4.7*   Intake/Output      10/27 0701 - 10/28 0700 10/28 0701 - 10/29 0700   P.O.     I.V. (mL/kg) 20 (0.2)    Other 340    IV Piggyback 100    Total Intake(mL/kg) 460 (4)    Urine (mL/kg/hr) 710 (0.3) 10   Total Output 710 10   Net -250 -10        Stool Occurrence      Foley:  10/24 Renal US 10/27: right kidney WNL, left not well assessed.   A:   Acute on Chronic renal failure.  Now followed by nephrology and on lasix gtt. Renal fxn a little worse. She is diffusely volume overloaded. Felt not to be a candidate for HD due to advanced age.  P:   Avoid nephrotoxins.  Renal adjust meds Cont lasix gtt (still want negative balance) F/u chemistry q 12  GASTROINTESTINAL  Lab 08/31/12 0415 08/29/12 0440  AST -- 13  ALT -- 9  ALKPHOS -- 91  BILITOT -- 0.7  PROT -- 6.0  ALBUMIN 2.9* 3.1*    A:   Obesity Malnutrition  Has been acutely on chronically ill for several weeks now. Do not think that her current MS allows [place] to push PO intake  P:   Advance diet when LOC improved.  Recommenced Ensure Complete to be added   HEMATOLOGIC  Lab 08/31/12 0415 08/30/12 0510 08/29/12 1010 08/29/12 0440 08/28/12 0545 08/27/12 1650  HGB 8.5* 8.2* -- 8.4* 7.5* 7.9*  HCT 27.7* 26.5* -- 27.0* 24.8* 25.5*  PLT 141* 116* -- 100* 121* 154  INR 1.88* 2.35* 3.17* -- 3.23* 2.92*  APTT -- -- -- -- -- --   A:   Anemia of Chronic disease.  Coumadin induced coagulopathy  Got 1 unit PRBC on 10/25. No evidence of active bleeding.  P:  Trend CBC Trigger to transfuse < 7 PPI Hold coumadin  Add [place] heparin for DVT prophylaxis when INR < 1.5   INFECTIOUS  Lab 08/31/12 0415 08/30/12 0510 08/29/12 0440 08/28/12 0545 08/27/12 1650  WBC 9.4 8.9 8.2 8.6 7.9  PROCALCITON -- -- -- -- --   Cultures: Antibiotics:   A:  No current evidence of infection.  Do note on initial eval she has residual pill still on her lips and tongue. Raising suspicion for risk of aspiration.   P:   Supportive care Check PCT.  F/U CXR  ENDOCRINE  Lab 08/31/12 0741 08/30/12 2140 08/30/12 1548 08/30/12 1118 08/30/12 0747  GLUCAP 209* 232* 210* 258* 198*   A:   DM w/ hyperglycemia P:   Cont SSI  NEUROLOGIC  A:  Acute Encephalopathy Most likely this is a mix of hypercarbia and Narcotics.  P:   D/c fentanyl Check ABG BIPAP as mentioned above   BEST PRACTICE / DISPOSITION Level of Care:  SDU Primary Service:  triad Consultants:  PCCM Code Status:  Full Diet:   DVT Px:  Was on coumadin, Will start [place] heparin when INR < 1.5 GI Px:  PPI Skin Integrity:  intact Social / Family:  Updated.   Gina Ashley,Gina Ashley,  08/31/2012, 8:56 AM  Attending:  I have seen and examined the patient with nurse practitioner/resident and agree with the note above.   Notes reviewed.    I think that Gina Ashley likely has chronic hypercapnic respiratory failure from obesity hyperventilation now exacerbated by opiates.  Her daughter states that she was taken off of BIPAP (changed to CPAP) about four days prior to discharge from [facility] to the SNF.   Minimizing narcotics and using BIPAP while asleep key intervention now.  Do not think this is a primary pulmonary problem, likely obesity hypoventilation.  Gina Ashley PCCM Pager: [phone] Cell: [phone] If no response, call [phone]

## 2012-09-01 ENCOUNTER — Inpatient Hospital Stay (HOSPITAL_COMMUNITY): Payer: Medicare Other

## 2012-09-01 DIAGNOSIS — I5032 Chronic diastolic (congestive) heart failure: Secondary | ICD-10-CM

## 2012-09-01 DIAGNOSIS — N179 Acute kidney failure, unspecified: Secondary | ICD-10-CM

## 2012-09-01 DIAGNOSIS — N189 Chronic kidney disease, unspecified: Secondary | ICD-10-CM

## 2012-09-01 LAB — BLOOD GAS, ARTERIAL
Bicarbonate: 34.9 mEq/L — ABNORMAL HIGH (ref 20.0–24.0)
Delivery systems: POSITIVE
Expiratory PAP: 7
FIO2: 0.4 %
Patient temperature: 98.3
TCO2: 33.6 mmol/L (ref 0–100)
pCO2 arterial: 70 mmHg (ref 35.0–45.0)
pH, Arterial: 7.318 — ABNORMAL LOW (ref 7.350–7.450)

## 2012-09-01 LAB — GLUCOSE, CAPILLARY
Glucose-Capillary: 163 mg/dL — ABNORMAL HIGH (ref 70–99)
Glucose-Capillary: 233 mg/dL — ABNORMAL HIGH (ref 70–99)

## 2012-09-01 LAB — RENAL FUNCTION PANEL
Albumin: 2.7 g/dL — ABNORMAL LOW (ref 3.5–5.2)
BUN: 83 mg/dL — ABNORMAL HIGH (ref 6–23)
Creatinine, Ser: 2.77 mg/dL — ABNORMAL HIGH (ref 0.50–1.10)
Glucose, Bld: 242 mg/dL — ABNORMAL HIGH (ref 70–99)
Phosphorus: 5.6 mg/dL — ABNORMAL HIGH (ref 2.3–4.6)

## 2012-09-01 LAB — PROTIME-INR: Prothrombin Time: 21 seconds — ABNORMAL HIGH (ref 11.6–15.2)

## 2012-09-01 LAB — PROCALCITONIN: Procalcitonin: 0.12 ng/mL

## 2012-09-01 MED ORDER — TORSEMIDE 100 MG PO TABS
100.0000 mg | ORAL_TABLET | Freq: Two times a day (BID) | ORAL | Status: DC
  Administered 2012-09-01 – 2012-09-03 (×5): 100 mg via ORAL
  Filled 2012-09-01 (×6): qty 1

## 2012-09-01 MED ORDER — FLEET ENEMA 7-19 GM/118ML RE ENEM
1.0000 | ENEMA | Freq: Every day | RECTAL | Status: DC | PRN
  Administered 2012-09-01: 1 via RECTAL
  Filled 2012-09-01: qty 1

## 2012-09-01 MED ORDER — SOTALOL HCL 80 MG PO TABS
40.0000 mg | ORAL_TABLET | Freq: Once | ORAL | Status: AC
  Administered 2012-09-01: 40 mg via ORAL
  Filled 2012-09-01: qty 0.5

## 2012-09-01 MED ORDER — INSULIN GLARGINE 100 UNIT/ML ~~LOC~~ SOLN
10.0000 [IU] | Freq: Every day | SUBCUTANEOUS | Status: DC
  Administered 2012-09-01 – 2012-09-03 (×3): 10 [IU] via SUBCUTANEOUS

## 2012-09-01 MED ORDER — POLYETHYLENE GLYCOL 3350 17 G PO PACK
17.0000 g | PACK | Freq: Two times a day (BID) | ORAL | Status: DC
  Administered 2012-09-01 – 2012-09-06 (×9): 17 g via ORAL
  Filled 2012-09-01 (×15): qty 1

## 2012-09-01 MED ORDER — TORSEMIDE 50 MG/5ML IV SOLN
100.0000 mg | Freq: Two times a day (BID) | INTRAVENOUS | Status: DC
  Filled 2012-09-01 (×2): qty 10

## 2012-09-01 MED ORDER — DOCUSATE SODIUM 100 MG PO CAPS
100.0000 mg | ORAL_CAPSULE | Freq: Two times a day (BID) | ORAL | Status: DC
  Administered 2012-09-01 – 2012-09-07 (×11): 100 mg via ORAL
  Filled 2012-09-01 (×15): qty 1

## 2012-09-01 NOTE — Progress Notes (Signed)
Inpatient Diabetes Program Recommendations  AACE/ADA: New Consensus Statement on Inpatient Glycemic Control (2013)  Target Ranges:  Prepandial:   less than 140 mg/dL      Peak postprandial:   less than 180 mg/dL (1-2 hours)      Critically ill patients:  140 - 180 mg/dL   Reason for Visit: Hyperglycemia  Results for Gina Ashley, Gina Ashley (MRN [id number]) as of 09/01/2012 14:09  Ref. Range 08/30/2012 15:48 08/30/2012 21:40 08/31/2012 07:41 08/31/2012 11:54 08/31/2012 17:26 08/31/2012 22:48 09/01/2012 08:22 09/01/2012 12:08  Glucose-Capillary Latest Range: 70-99 mg/dL 409 (H) 811 (H) 914 (H) 199 (H) 247 (H) 231 (H) 210 (H) 241 (H)    CBGs continue to run high.  Lantus increased to 10 units.  CHO-mod diet restarting and pt is eating.  Recommend adding Novolog 2 units tidwc if pt eats >50% meal.  Will continue to follow.

## 2012-09-01 NOTE — Clinical Social Work Note (Signed)
CSW met with daughter, Eunice Blase to check in and offer support. Daughter states Pt asking about how to complete a will. CSW clarified with daughter that Pt meant a financial will. CSW explained process to complete while Pt here in hospital. Daughter aware that CSW cannot notarize financial will or other financial paperwork, but that family can bring in own notary.   Daughter shared that she has three sisters, two close by and one in [place] state. All plan to visit soon. Daughter reports to have good support, no other needs currently. CSW offered support of chaplain as well, family not interested currently.  CSW will continue to support family, daughter aware how to get in touch with CSW as well.   Doreen Salvage, LCSW ICU/Stepdown Clinical Social Worker [facility] Cell [phone] Hours 8am-1200pm M-F

## 2012-09-01 NOTE — Progress Notes (Signed)
Name: Gina Ashley MRN: [id number] DOB: 09/07/1927    LOS: 5  Referring Provider:  Rito Ehrlich Reason for Referral:  AMS  PULMONARY / CRITICAL CARE MEDICINE   Brief patient description:  This is a 78 year old female w/ multiple co-morbids including: CRF stage IV, AF on coumadin, OSA (wears CPAP at HS). Recently transferred to SNF after ~ 2 week stay at [facility] for acute on chronic renal failure. Had unwitnessed fall on 10/24 resulting in BLE fractures. Admitted by medical service rx has included pain management, IV lasix gtt for volume overload and ortho consult. PCCM asked to see on 10/28 for AMS and concern about hypercarbia.   Events Since Admission: More awake.   Current Status: gaurded  Vital Signs: Temp:  [97.9 F (36.6 C)-99 F (37.2 C)] 98.3 F (36.8 C) (10/29 0400) Pulse Rate:  [52-96] 96  (10/29 0700) Resp:  [11-21] 18  (10/29 0700) BP: (96-135)/(51-83) 128/78 mmHg (10/29 0700) SpO2:  [76 %-100 %] 100 % (10/29 0700) FiO2 (%):  [30 %-40 %] 40 % (10/29 0600) Weight:  [117.6 kg (259 lb 4.2 oz)] 117.6 kg (259 lb 4.2 oz) (10/29 0500)  Physical Examination: General:  Chronically ill appearing 78 year old female. Slow to awaken, sp slurred. Follows commands.  Neuro:  Awake, oriented X 3. Denies pain.  HEENT:  + JVD, MM dry Cardiovascular:  Regular irregular  Lungs:  Decreased t/o Abdomen:  No OM, + bowel sounds  Musculoskeletal:  LE pain Skin:  + anasarca   Principal Problem:  *Multiple fractures of both lower extremities Active Problems:  DM  OBSTRUCTIVE SLEEP APNEA  GERD  PAF (paroxysmal atrial fibrillation), was in SR 06/2012   HTN (hypertension)  COPD (chronic obstructive pulmonary disease)  Hyponatremia  CKD (chronic kidney disease)  Pleural effusion  Hypercapnemia  Acute respiratory failure  Chronic diastolic heart failure   ASSESSMENT AND PLAN  PULMONARY  Lab 09/01/12 0524 08/31/12 0938 08/28/12 1615 08/28/12 0844  PHART 7.318* 7.312* 7.385 7.262*    PCO2ART 70.0* 69.6* 60.5* 79.8*  PO2ART 86.5 89.7 141.0* 77.1*  HCO3 34.9* 34.2* 35.4* 34.8*  O2SAT 97.5 97.5 99.8 95.3   Ventilator Settings: Vent Mode:  [-]  FiO2 (%):  [30 %-40 %] 40 % CXR:  Bilateral airspace disease, R>L. C/W edema and low volume. Decreased aeration c/w exam on 10/29 ETT:    A:   Acute Hypercarbic Respiratory Failure: in setting of narcotic effect superimposed on underlying OSA and CRF Bilateral atelectasis Pulmonary edema/volume excess +/- element of effusion  Renal fxn worse, CXR w/ decreased aeration. PT does not want to be placed on vent.  P:   Cont BIPAP PRN Try to find the smallest, most effective dose for analgesia Continue diuresis, will add toresemide F/u cxr Check PCT  CARDIOVASCULAR No results found for this basename: TROPONINI:5,LATICACIDVEN:5, O2SATVEN:5,PROBNP:5 in the last 168 hours ECG:  afib Lines: ECHO 10/21: estimated ejection fraction was in the range of 55% to 60%. Wall motion was normal; there were no regional wall motion abnormalities. The study is not technically sufficient to allow evaluation of LV diastolic function. A:  AFIB HTN Pulmonary edema  Both fib and HTN controlled. Think that edema more a component of renal failure, and perhaps element of diastolic dysfunction.  P:  Cont tele monitoring Cont diuresis  F/u CXR BIPAP should also help w/ edema   RENAL  Lab 09/01/12 0342 08/31/12 0415 08/30/12 0510 08/29/12 0440 08/28/12 1135 08/27/12 2110  NA 131* 135 136 136 135 --  K 4.2 4.6 -- -- -- --  CL 88* 93* 93* 94* 94* --  CO2 35* 34* 37* 37* 36* --  BUN 83* 74* 61* 49* 51* --  CREATININE 2.77* 2.69* 2.43* 1.94* 2.21* --  CALCIUM 8.7 8.6 8.5 8.5 9.1 --  MG -- -- -- -- -- 2.3  PHOS 5.6* 5.8* 5.2* -- -- 4.7*   Intake/Output      10/28 0701 - 10/29 0700 10/29 0701 - 10/30 0700   I.V. (mL/kg)     Other 540    IV Piggyback 198    Total Intake(mL/kg) 738 (6.3)    Urine (mL/kg/hr) 1350 (0.5)    Total Output 1350     Net -612          Foley:  10/24 Renal US 10/27: right kidney WNL, left not well assessed.   A:   Acute on Chronic renal failure.  Followed by nephrology and on lasix gtt. Renal fxn a little worse. She is diffusely volume overloaded. Felt not to be a candidate for HD due to advanced age. She is lasix resistant, now on 160 mg q 8. Per family had not responded to this in the past.  P:   Avoid nephrotoxins.  Renal adjust meds Change lasix to torsemide (discussed w/ renal) F/u chemistry q 12  GASTROINTESTINAL  Lab 09/01/12 0342 08/31/12 0415 08/29/12 0440  AST -- -- 13  ALT -- -- 9  ALKPHOS -- -- 91  BILITOT -- -- 0.7  PROT -- -- 6.0  ALBUMIN 2.7* 2.9* 3.1*    A:   Obesity Malnutrition  Has been acutely on chronically ill for several weeks now.  P:   Advance diet when LOC improved.  Recommenced Ensure Complete to be added   HEMATOLOGIC  Lab 09/01/12 0348 08/31/12 0415 08/30/12 0510 08/29/12 1010 08/29/12 0440 08/28/12 0545 08/27/12 1650  HGB -- 8.5* 8.2* -- 8.4* 7.5* 7.9*  HCT -- 27.7* 26.5* -- 27.0* 24.8* 25.5*  PLT -- 141* 116* -- 100* 121* 154  INR 1.89* 1.88* 2.35* 3.17* -- 3.23* --  APTT -- -- -- -- -- -- --   A:   Anemia of Chronic disease.  Coumadin induced coagulopathy  Got 1 unit PRBC on 10/25. No evidence of active bleeding.  P:  Trend CBC Trigger to transfuse < 7 PPI Hold coumadin  Add [place] heparin for DVT prophylaxis when INR < 1.5 No SCDs as will contribute to pain given fracture location  INFECTIOUS  Lab 09/01/12 0348 08/31/12 0415 08/30/12 0510 08/29/12 0440 08/28/12 0545 08/27/12 1650  WBC -- 9.4 8.9 8.2 8.6 7.9  PROCALCITON 0.12 <0.10 -- -- -- --   Cultures: Antibiotics:   A:  No current evidence of infection.  P:   Supportive care Trend WBC  ENDOCRINE  Lab 09/01/12 0822 08/31/12 2248 08/31/12 1726 08/31/12 1154 08/31/12 0741  GLUCAP 210* 231* 247* 199* 209*   A:   DM w/ hyperglycemia P:   Cont SSI Needs adjustment in basal  insulin  NEUROLOGIC  A:  Acute Encephalopathy Most likely this is a mix of hypercarbia and Narcotics.  P:   Supportive care Minimal narcotics  D/c gabapentin   Goals of care Spoke at length with the pt and two of her daughters. We have discussed current clinical status. Her decline over the past few days and concern about what we will do should she continue to worsen. She is not a HD candidate according to two separate nephrology opinions. This leaves  little to add should she arrest from progressive renal failure. They (the pt and family) want full medical care, however should she arrest in spite of this she does not want CPR, does not want the life support machine. Therefore based on this discussion we have fill out DNR/DNI status. We will continue aggressive medical care, including IV diuresis. Have spoken w/ Nephrology and shared the pt's experience in the past and prior lasix resistance. We will try switching to torsemide.   BEST PRACTICE / DISPOSITION Level of Care:  SDU Primary Service:  triad Consultants:  PCCM Code Status:  Full-->DNR Diet:   DVT Px:  Was on coumadin, Will start [place] heparin when INR < 1.5 GI Px:  PPI Skin Integrity:  intact Social / Family:  Updated.   BABCOCK,PETE,  09/01/2012, 8:37 AM  Attending:  I have seen and examined the patient with nurse practitioner/resident and agree with the note above.   We are not headed in the right direction here.  I agree that she is not a candidate for dialysis and agree completely with her decision to forego mechanical ventilation (outside of BIPAP).  Hopefully we will see an effect from the torsemide.  Yolonda Kida PCCM Pager: [phone] Cell: [phone] If no response, call [phone]

## 2012-09-01 NOTE — Progress Notes (Signed)
Patient ID: Gina Ashley, female   DOB: [birth year], 78 y.o.   MRN: [id number] S:More awake/alert today and off of biPap.  C/o legs feeling "tight" O:BP 113/71  Pulse 106  Temp 97.8 F (36.6 C) (Oral)  Resp 12  Ht 5\' 3"  (1.6 m)  Wt 117.6 kg (259 lb 4.2 oz)  BMI 45.93 kg/m2  SpO2 98%  Intake/Output Summary (Last 24 hours) at 09/01/12 1148 Last data filed at 09/01/12 1000  Gross per 24 hour  Intake    638 ml  Output   1780 ml  Net  -1142 ml   Intake/Output: I/O last 3 completed shifts: In: 998 [I.V.:20; Other:680; IV Piggyback:298] Out: 1500 [Urine:1500]  Intake/Output this shift:  Total I/O In: 60 [Other:60] Out: 600 [Urine:600] Weight change: 1.4 kg (3 lb 1.4 oz) Gen:WD obese WUJ:WJXBJ Resp:bibasilar rales/decreased BS YNW:GNFAO Ext:3+ anasarca   Lab 09/01/12 0342 08/31/12 0415 08/30/12 0510 08/29/12 0440 08/28/12 1135 08/28/12 0545 08/27/12 2110 08/27/12 1650  NA 131* 135 136 136 135 132* -- 133*  K 4.2 4.6 4.4 4.4 5.6* 5.5* -- 5.1  CL 88* 93* 93* 94* 94* 92* -- 93*  CO2 35* 34* 37* 37* 36* 35* -- 37*  GLUCOSE 242* 240* 217* 178* 209* 178* -- 214*  BUN 83* 74* 61* 49* 51* 50* -- 46*  CREATININE 2.77* 2.69* 2.43* 1.94* 2.21* 2.02* -- 1.72*  ALBUMIN 2.7* 2.9* -- 3.1* -- -- -- --  CALCIUM 8.7 8.6 8.5 8.5 9.1 9.1 -- 9.0  PHOS 5.6* 5.8* 5.2* -- -- -- 4.7* --  AST -- -- -- 13 -- -- -- --  ALT -- -- -- 9 -- -- -- --   Liver Function Tests:  Lab 09/01/12 0342 08/31/12 0415 08/29/12 0440  AST -- -- 13  ALT -- -- 9  ALKPHOS -- -- 91  BILITOT -- -- 0.7  PROT -- -- 6.0  ALBUMIN 2.7* 2.9* 3.1*   No results found for this basename: LIPASE:3,AMYLASE:3 in the last 168 hours No results found for this basename: AMMONIA:3 in the last 168 hours CBC:  Lab 08/31/12 0415 08/30/12 0510 08/29/12 0440 08/28/12 0545 08/27/12 1650  WBC 9.4 8.9 8.2 -- --  NEUTROABS -- -- -- -- 6.2  HGB 8.5* 8.2* 8.4* -- --  HCT 27.7* 26.5* 27.0* -- --  MCV 87.4 87.2 86.0 86.1 85.0  PLT 141*  116* 100* -- --   Cardiac Enzymes: No results found for this basename: CKTOTAL:5,CKMB:5,CKMBINDEX:5,TROPONINI:5 in the last 168 hours CBG:  Lab 09/01/12 0822 08/31/12 2248 08/31/12 1726 08/31/12 1154 08/31/12 0741  GLUCAP 210* 231* 247* 199* 209*    Iron Studies: No results found for this basename: IRON,TIBC,TRANSFERRIN,FERRITIN in the last 72 hours Studies/Results: Dg Chest Port 1 View  09/01/2012  *RADIOLOGY REPORT*  Clinical Data: Pleural effusions.  PORTABLE CHEST - 1 VIEW  Comparison: Plain film chest 08/28/2012 and 08/30/2012.  Findings: Right worse than left pleural effusions and airspace disease do not appear changed compared to the 08/28/2012 study. There is cardiomegaly.  Right PICC remains in place.  IMPRESSION: No marked change in right worse than left effusions and airspace disease compared to the 08/28/2012 study.   Original Report Authenticated By: Bernadene Bell. D'ALESSIO, M.D.       . antiseptic oral rinse  15 mL Mouth Rinse q12n4p  . chlorhexidine  15 mL Mouth Rinse BID  . diltiazem  90 mg Oral QID  . feeding supplement  30 mL Oral TID WC  . fluticasone  1 puff Inhalation BID  . insulin aspart  0-15 Units Subcutaneous TID WC  . insulin glargine  10 Units Subcutaneous QHS  . isosorbide dinitrate  20 mg Oral TID  . levalbuterol  0.63 mg Nebulization BID  . metolazone  5 mg Oral Daily  . pantoprazole  40 mg Oral Daily  . polyethylene glycol  17 g Oral Daily  . sodium chloride  10-40 mL Intracatheter Q12H  . sotalol  80 mg Oral BID  . torsemide  100 mg Oral BID  . DISCONTD: furosemide  160 mg Intravenous Q8H  . DISCONTD: gabapentin  300 mg Oral BID  . DISCONTD: insulin glargine  8 Units Subcutaneous QHS  . DISCONTD: torsemide  100 mg Intravenous Q12H    BMET    Component Value Date/Time   NA 131* 09/01/2012 0342   K 4.2 09/01/2012 0342   CL 88* 09/01/2012 0342   CO2 35* 09/01/2012 0342   GLUCOSE 242* 09/01/2012 0342   BUN 83* 09/01/2012 0342   CREATININE 2.77*  09/01/2012 0342   CALCIUM 8.7 09/01/2012 0342   GFRNONAA 15* 09/01/2012 0342   GFRAA 17* 09/01/2012 0342   CBC    Component Value Date/Time   WBC 9.4 08/31/2012 0415   RBC 3.17* 08/31/2012 0415   HGB 8.5* 08/31/2012 0415   HCT 27.7* 08/31/2012 0415   PLT 141* 08/31/2012 0415   MCV 87.4 08/31/2012 0415   MCH 26.8 08/31/2012 0415   MCHC 30.7 08/31/2012 0415   RDW 16.8* 08/31/2012 0415   LYMPHSABS 0.8 08/27/2012 1650   MONOABS 0.6 08/27/2012 1650   EOSABS 0.2 08/27/2012 1650   BASOSABS 0.0 08/27/2012 1650     Assessment/Plan: 1. AKI/CKD- pt with improved UOP with higher dose of IV lasix and zaroxolyn but agree that she may have a better response with Torsemide.  I discussed dosing with Anders Simmonds, NP and will start with 100mg  IV q12 and follow UOP. Pt has advanced CKD stage IV at baseline, now with decompensated RHF, morbid obesity and bilateral leg fractures. Pt is a poor candidate for HD given advanced age, morbid obesity, multiple irreversible chronic medical problems, none of which would improve with RRT, nor her overal QOL. I discussed this with the patient and her daughter. All are in agreement not to attempt RRT. Will try to maximize volume status as well as pulm status and hopefully stabilize renal function prior to proceeding with surgery. Will discuss with Ortho. Need to avoid NSAIDs/COX-II Inhibitors/IV contrast material 2. Hypercapnic respiratory failure/Obesity-hypoventilation syndrome- improved.  Cont with O2 via [place] per PCCM 3. CHF- mainly right sided. Diuresed 1L yesterday.  Agree with changing to IV torsemide 100mg  q12 given significant anasarca. 4. ABLA/ACDz- follow H/H and transfuse prn. Consider EPO 5. A fib- rate-controlled 6. Hip fractures- as above. I discussed case with Dr. Charlann Boxer on 08/31/12 and we agreed to hold off on surgery at this time given decompensated CHF/hypercapnea and AKI/CKD.  Hopefully her overall condition will improve with volume removal.  Pain is  adequately controlled for now 7. HTN- stable 8. Hyponatremia- due to CHF cont with diuresis 9. DM- poorly controlled, defer to primary svc 10. Dispo- poor QOL with multiple comorbidities. Will likely return to SNF once stable vs LTCF if she improves.  Agree with discussion and suggestions of Anders Simmonds, NP regarding limitations of medical therapy.  Palliative care may be required if her clinical course was to worsen.  Kathryn Cosby A

## 2012-09-01 NOTE — Progress Notes (Signed)
Patient ID: Gina Ashley, female   DOB: 12-Apr-1927, 78 y.o.   MRN: [id number]  Spoke to Nephrologist Dr. Arrie Aran re patients' condition yesterday.  Determined that she was at this point not fit for surgery tonight.  Still needs to diurese patient due to right heart failure.  Comfortable at time of evaluation with multiple family members around  Orthopaedic plan at this point will be to monitor medical condition, once optimized will plan on fixing both lower extremities.  At this pont I am planing on Monday the 4th as I will be out of town Thursday evening through weekend.  Will follow for now

## 2012-09-01 NOTE — Progress Notes (Signed)
CRITICAL VALUE ALERT  Critical value received:  pCO2  Date of notification:  09/01/12  Time of notification:  0540  Critical value read back:yes  Nurse who received alert:  Kinnie Feil, RN  MD notified (1st page):  Bretta Bang, NP  Time of first page:  0600  MD notified (2nd page):  Time of second page: [phone]  Responding MD:  Nash Dimmer  Time MD responded:  [phone] no new orders

## 2012-09-01 NOTE — Progress Notes (Signed)
PCP: Ernestine Conrad, MD  Brief HPI: Pt reportedly from [facility] place and had an unwitnessed fall around 4:00 am on day of admission. After the fall patient subsequently developed lower extremity discomfort and was subsequently taken to [facility] ED for further evaluation. History was obtained from daughters in the room and ED records as patient was given dilaudid for BL LE discomfort and subsequently was "sleepy" per family. In the room initially ortho technician was at bedside placing braces and patient had discomfort with manipulation of lower extremities while placing gauze.  Past medical history:  Past Medical History  Diagnosis Date  . Diabetes mellitus     x 15 yrs  . GERD (gastroesophageal reflux disease)   . Back pain, chronic   . Pain in shoulder   . Pain in ankle   . Hiatal hernia   . HTN (hypertension)     all her life  . CKD (chronic kidney disease), stage III     GFR: 38  . Atrial fibrillation     since 1996  . Sleep apnea   . Fall 3 weeks ago    was evaluated at [facility]  . OSA on CPAP   . Depression   . PONV (postoperative nausea and vomiting)     difficult to wake up  . Chronic diastolic heart failure 08/31/2012    Consultants: Ortho (Dr. Charlann Boxer), Pulm, [facility] Heart (Cards), Nephrology  Procedures: Plan is for ORIF of fractures sometime this week  Subjective: Patient more alert today compared to yesterday. Wore the bipap all night per daughter. Continues to have pain.    Objective: Vital signs in last 24 hours: Temp:  [97.4 F (36.3 C)-99 F (37.2 C)] 98.3 F (36.8 C) (10/29 0400) Pulse Rate:  [52-92] 77  (10/29 0500) Resp:  [11-21] 21  (10/29 0500) BP: (96-148)/(51-83) 117/53 mmHg (10/29 0500) SpO2:  [76 %-100 %] 99 % (10/29 0500) FiO2 (%):  [30 %-40 %] 40 % (10/29 0500) Weight:  [117.6 kg (259 lb 4.2 oz)] 117.6 kg (259 lb 4.2 oz) (10/29 0500) Weight change: 1.4 kg (3 lb 1.4 oz) Last BM Date:  (PTA per patient and chart)  Intake/Output from  previous day: 10/28 0701 - 10/29 0700 In: 632 [IV Piggyback:132] Out: 1350 [Urine:1350] Intake/Output this shift:    General appearance: Alert today. Appears stated age, no distress, moderately obese Resp: decreased air entry at the bases. Few crackles right base. No wheezing Cardio: regular rate and rhythm, S1, S2 normal, no murmur, click, rub or gallop GI: soft, non-tender; bowel sounds normal; no masses,  no organomegaly Extremities: covered with braces. Edema is noted b/l LE. no erythema Pulses: good radial pulses, but poor pedal pulses Neurologic: No focal deficits. Some tremors noted.   Lab Results:  [facility] 08/31/12 0415 08/30/12 0510  WBC 9.4 8.9  HGB 8.5* 8.2*  HCT 27.7* 26.5*  PLT 141* 116*   BMET  Basename 09/01/12 0342 08/31/12 0415  NA 131* 135  K 4.2 4.6  CL 88* 93*  CO2 35* 34*  GLUCOSE 242* 240*  BUN 83* 74*  CREATININE 2.77* 2.69*  CALCIUM 8.7 8.6  ALT -- --    Studies/Results: US Renal Port  08/30/2012  *RADIOLOGY REPORT*  Clinical Data: Acute renal failure  RENAL/URINARY TRACT ULTRASOUND COMPLETE  Comparison: CT 05/22/2012  Findings:  Right Kidney = 10.3 cm.  No hydronephrosis or mass.  A small simple cyst in upper pole.  Left kidney = Not well visualized. Bladder:  Foley  catheter placed in the  IMPRESSION:  Normal right kidney.  Left kidney is poorly assessed.   Original Report Authenticated By: Genevive Bi, M.D.    Dg Chest Port 1 View  09/01/2012  *RADIOLOGY REPORT*  Clinical Data: Pleural effusions.  PORTABLE CHEST - 1 VIEW  Comparison: Plain film chest 08/28/2012 and 08/30/2012.  Findings: Right worse than left pleural effusions and airspace disease do not appear changed compared to the 08/28/2012 study. There is cardiomegaly.  Right PICC remains in place.  IMPRESSION: No marked change in right worse than left effusions and airspace disease compared to the 08/28/2012 study.   Original Report Authenticated By: Bernadene Bell. Maricela Curet, M.D.    Dg  Chest Port 1 View  08/30/2012  *RADIOLOGY REPORT*  Clinical Data: Fall pleural effusions  PORTABLE CHEST - 1 VIEW  Comparison: Chest radiograph 08/28/2012  Findings: Stable enlarged heart silhouette.  There is a right PICC line in place.  There is generalized central venous congestion and low lung volumes with small pleural effusions. Effusions are improved.  No pneumothorax.  IMPRESSION:  1.  Low lung volumes and pleural effusions with central venous congestion suggest congestive heart failure.  2.  Effusions are slightly improved.   Original Report Authenticated By: Genevive Bi, M.D.     Medications:  Scheduled:    . antiseptic oral rinse  15 mL Mouth Rinse q12n4p  . chlorhexidine  15 mL Mouth Rinse BID  . diltiazem  90 mg Oral QID  . feeding supplement  30 mL Oral TID WC  . fluticasone  1 puff Inhalation BID  . furosemide  160 mg Intravenous Q8H  . gabapentin  300 mg Oral BID  . insulin aspart  0-15 Units Subcutaneous TID WC  . insulin glargine  8 Units Subcutaneous QHS  . isosorbide dinitrate  20 mg Oral TID  . levalbuterol  0.63 mg Nebulization BID  . metolazone  5 mg Oral Daily  . pantoprazole  40 mg Oral Daily  . polyethylene glycol  17 g Oral Daily  . sodium chloride  10-40 mL Intracatheter Q12H  . sotalol  80 mg Oral BID  . DISCONTD: furosemide  120 mg Intravenous Q8H   Continuous:  ZOX:WRUEAVWU sodium, feeding supplement, HYDROcodone-acetaminophen, levalbuterol, metoprolol, ondansetron (ZOFRAN) IV, ondansetron, sodium chloride, DISCONTD: fentaNYL, DISCONTD: HYDROcodone-acetaminophen  Assessment/Plan:  Principal Problem:  *Multiple fractures of both lower extremities Active Problems:  DM  OBSTRUCTIVE SLEEP APNEA  GERD  PAF (paroxysmal atrial fibrillation), was in SR 06/2012   HTN (hypertension)  COPD (chronic obstructive pulmonary disease)  Hyponatremia  CKD (chronic kidney disease)  Pleural effusion  Hypercapnemia  Acute respiratory failure  Chronic  diastolic heart failure   Multiple fractures of both lower extremities Seen by Ortho. They will discuss and surgery is planned for sometime this week. Pain control is an issue. Patient apparently becomes somnolent with Fentanyl. Try to avoid as much as possible. She had the same issue with Dilaudid and she is allergic to Morphine. On increased dose of oral agents. Continue Bedrest. Foley. May require prolonged ventilatory support if patient undergoes surgery unless spinal or epidural is used. Patient has been seen by Pulmonology, Cardiology and Nephrology. It appears Nephrology was to discuss with Ortho yesterday regarding her fluid status. Unclear what transpired. It is clear that she remains at high risk for cardiac complications in the perioperative period. Patient and daughter aware.  Acute respiratory failure Possibly from OSA, hypercapnea and narcotics. Has improved with Bipap. Bipap QHS and PRN during  daytime.   Pleural Effusion/Fluid Overload Had normal systolic EF on recent ECHO. Has normal WBC. Infectious process is less likely. On high dose Lasix. More UO noted in last 24 hrs.   Acute on CKD Stage 3 On high dose diuretics. Creatinine getting worse. UO picked up yesterday. Appreciate Nephrology input and management. Renal US report reviewed. Repeat CXR continues to show fluid overload. Baseline creatinine between 1.7-1.9. Potassium improved.    History of COPD and OSA with obesity hypoventilation Stable. See above. Started inhaled steroids. Appreciate Pulmonology input. Continue Bipap PRN and QHS.  History of HTN  Continue to monitor BP. BP running low at times but she remains asymptomatic. Monitor for now without changes. Holding hydralazine. Continue other meds. Could hold Imdur as well if BP continues to be low. Cardiology to weigh in as well.  History of Atrial fibrillation  Was tachycardic 1025 but did not appear to be afib. HR now stable. Cardizem dose was increased. Metoprolol  as needed. Holding warfarin in anticipation of surgery. Due to recent TIA will like her back on anti-coagulation as soon as possible. Cardiology following.  Hyponatremia  Based on x ray findings and physical exam, suspect that this is secondary to hypervolemic hyponatremia from diastolic HF.   Anemia Likely secondary to chronic disease. Was transfused. Monitor. May need more transfusions prior to surgery.  DM 2 SSI while patient in house. Will increased dose of lantus today. Will be cautious due to renal failure with worsening creatinine.  GERD  Protonix IV  Code Status Full Code  DVT Prophylaxis Had Therapeutic INR. INR now 1.8. If surgery is planned for 10/29 will resume anti-coagulation as soon as possible post operatively. If surgery is delayed she will need Enoxaparin at DVT proph dose.  Family Communication Discussed with daughter at bedside  Disposition Plan Will likely need to go back to SNF when ready.     LOS: 5 days   [facility]  [facility] Pager [phone] 09/01/2012, 7:35 AM

## 2012-09-01 NOTE — Progress Notes (Signed)
CARE MANAGEMENT NOTE 09/01/2012  Patient:  Gina Ashley   Account Number:  [id number]  Date Initiated:  08/28/2012  Documentation initiated by:  DAVIS,RHONDA  Subjective/Objective Assessment:   pt fell at snf fx rt upper tibia inloving the tkr hardward and the left tib-fib cxr show lupl. effusions.     Action/Plan:   snf   Anticipated DC Date:  09/04/2012   Anticipated DC Plan:  SKILLED NURSING FACILITY  In-house referral  NA      DC Planning Services  NA      [facility] Choice  NA   Choice offered to / List presented to:  NA   DME arranged  NA      DME agency  NA     HH arranged  NA      HH agency  NA   Status of service:  In process, will continue to follow Medicare Important Message given?  NA - LOS <3 / Initial given by admissions (If response is "NO", the following Medicare IM given date fields will be blank) Date Medicare IM given:   Date Additional Medicare IM given:    Discharge Disposition:    Per UR Regulation:  Reviewed for med. necessity/level of care/duration of stay  If discussed at Long Length of Stay Meetings, dates discussed:    Comments:  [id number]/Rhonda Earlene Plater, RN, BSN, CCM: CHART REVIEWED AND UPDATED. Patient is for O.R. today to correct fractures of the lower legs bilaterally/or cancelled due to fld overload and resp status-o2 level dropped into the 70's, o2 increased to Fi02 of 50% NO DISCHARGE NEEDS PRESENT AT THIS TIME. CASE MANAGEMENT [phone]  [id number]/Rhonda Davis,RN,BSn,CCM

## 2012-09-02 DIAGNOSIS — E876 Hypokalemia: Secondary | ICD-10-CM | POA: Diagnosis not present

## 2012-09-02 LAB — PROCALCITONIN: Procalcitonin: <0.1 ng/mL

## 2012-09-02 LAB — RENAL FUNCTION PANEL
Albumin: 2.7 g/dL — ABNORMAL LOW (ref 3.5–5.2)
BUN: 84 mg/dL — ABNORMAL HIGH (ref 6–23)
Creatinine, Ser: 2.39 mg/dL — ABNORMAL HIGH (ref 0.50–1.10)
Glucose, Bld: 146 mg/dL — ABNORMAL HIGH (ref 70–99)
Phosphorus: 4.3 mg/dL (ref 2.3–4.6)

## 2012-09-02 LAB — GLUCOSE, CAPILLARY
Glucose-Capillary: 133 mg/dL — ABNORMAL HIGH (ref 70–99)
Glucose-Capillary: 205 mg/dL — ABNORMAL HIGH (ref 70–99)

## 2012-09-02 LAB — CBC
HCT: 26.3 % — ABNORMAL LOW (ref 36.0–46.0)
Hemoglobin: 8.4 g/dL — ABNORMAL LOW (ref 12.0–15.0)
MCV: 84.6 fL (ref 78.0–100.0)
RDW: 16.3 % — ABNORMAL HIGH (ref 11.5–15.5)
WBC: 7.6 10*3/uL (ref 4.0–10.5)

## 2012-09-02 LAB — PROTIME-INR: Prothrombin Time: 19.2 seconds — ABNORMAL HIGH (ref 11.6–15.2)

## 2012-09-02 MED ORDER — BISACODYL 10 MG RE SUPP
10.0000 mg | Freq: Every day | RECTAL | Status: DC | PRN
  Administered 2012-09-05 – 2012-09-11 (×2): 10 mg via RECTAL
  Filled 2012-09-02 (×2): qty 1

## 2012-09-02 MED ORDER — POTASSIUM CHLORIDE CRYS ER 20 MEQ PO TBCR
30.0000 meq | EXTENDED_RELEASE_TABLET | Freq: Once | ORAL | Status: AC
  Administered 2012-09-02: 30 meq via ORAL
  Filled 2012-09-02 (×2): qty 1

## 2012-09-02 MED ORDER — DIPHENHYDRAMINE-ZINC ACETATE 2-0.1 % EX CREA
TOPICAL_CREAM | Freq: Three times a day (TID) | CUTANEOUS | Status: DC | PRN
  Filled 2012-09-02: qty 28

## 2012-09-02 MED ORDER — SOTALOL HCL 80 MG PO TABS
40.0000 mg | ORAL_TABLET | Freq: Once | ORAL | Status: DC
  Filled 2012-09-02: qty 0.5

## 2012-09-02 MED ORDER — POTASSIUM CHLORIDE CRYS ER 20 MEQ PO TBCR
40.0000 meq | EXTENDED_RELEASE_TABLET | ORAL | Status: AC
  Administered 2012-09-02: 40 meq via ORAL
  Filled 2012-09-02: qty 2

## 2012-09-02 MED ORDER — HEPARIN SODIUM (PORCINE) 5000 UNIT/ML IJ SOLN
5000.0000 [IU] | Freq: Three times a day (TID) | INTRAMUSCULAR | Status: DC
  Administered 2012-09-02 – 2012-09-03 (×2): 5000 [IU] via SUBCUTANEOUS
  Filled 2012-09-02 (×5): qty 1

## 2012-09-02 MED ORDER — MAGNESIUM CITRATE PO SOLN
1.0000 | Freq: Once | ORAL | Status: AC
  Administered 2012-09-02: 1 via ORAL
  Filled 2012-09-02: qty 296

## 2012-09-02 MED ORDER — DIPHENHYDRAMINE HCL 50 MG PO CAPS
50.0000 mg | ORAL_CAPSULE | Freq: Once | ORAL | Status: AC
  Administered 2012-09-02: 50 mg via ORAL
  Filled 2012-09-02 (×2): qty 1

## 2012-09-02 MED ORDER — HYDROCODONE-ACETAMINOPHEN 5-325 MG PO TABS
2.0000 | ORAL_TABLET | Freq: Four times a day (QID) | ORAL | Status: DC | PRN
  Administered 2012-09-02 – 2012-09-03 (×2): 2 via ORAL
  Filled 2012-09-02 (×2): qty 2

## 2012-09-02 NOTE — Progress Notes (Signed)
Placed patient on BiPAP for the night, 14/7 and 40%. Patient stated she would try to keep it on throughout the night. RT encouraged her to wear it as long as she could. RT will monitor.

## 2012-09-02 NOTE — Progress Notes (Signed)
CRITICAL VALUE ALERT  Critical value received:  CO2  Date of notification:  09/02/12  Time of notification:  0446  Critical value read back:yes  Nurse who received alert:  Kinnie Feil, RN  MD notified (1st page):  Craige Cotta, NP  Time of first page:  [phone]  MD notified (2nd page):  Time of second page:  Responding MD:  Craige Cotta, NP  Time MD responded:  [phone]; no new orders given for CO2, new order written for potassium replacement.

## 2012-09-02 NOTE — Progress Notes (Signed)
eLink Physician-Brief Progress Note Patient Name: Gina Ashley DOB: [birth year] MRN: [id number]  Date of Service  09/02/2012   HPI/Events of Note   Heparin ordered for DVT proph  eICU Interventions  Heparin sq ordered   Intervention Category Intermediate Interventions: Best-practice therapies (e.g. DVT, beta blocker, etc.)  Shan Levans 09/02/2012, 4:24 PM

## 2012-09-02 NOTE — Clinical Social Work Note (Signed)
CSW continues to follow for SNF and support. Pt coping well today, appreciative of support and "excellent care". Great support from large extended family. Daughter, Rushie Goltz coming from [place] later today.   CSW will keep checking on Pt and family.   Doreen Salvage, LCSW ICU/Stepdown Clinical Social Worker [facility] Cell [phone] Hours 8am-1200pm M-F

## 2012-09-02 NOTE — Progress Notes (Signed)
Patient ID: Gina Ashley, female   DOB: Oct 19, 1927, 78 y.o.   MRN: [id number] S:more awake/alert and feels better O:BP 131/83  Pulse 101  Temp 97.8 F (36.6 C) (Axillary)  Resp 11  Ht 5\' 3"  (1.6 m)  Wt 118.4 kg (261 lb 0.4 oz)  BMI 46.24 kg/m2  SpO2 96%  Intake/Output Summary (Last 24 hours) at 09/02/12 1145 Last data filed at 09/02/12 1030  Gross per 24 hour  Intake    220 ml  Output   4345 ml  Net  -4125 ml   Intake/Output: I/O last 3 completed shifts: In: 652 [Other:520; IV Piggyback:132] Out: 4115 [Urine:4115]  Intake/Output this shift:  Total I/O In: -  Out: 1600 [Urine:1600] Weight change: 0.8 kg (1 lb 12.2 oz) Gen:WD obese AAF lying flat in NAD with nasal O2 CVS:no rub, tachy Resp:decreased BS at bases WUJ:WJXBJ Ext:2+edema b/l   Lab 09/02/12 0401 09/01/12 0342 08/31/12 0415 08/30/12 0510 08/29/12 0440 08/28/12 1135 08/28/12 0545 08/27/12 2110  NA 132* 131* 135 136 136 135 132* --  K 3.2* 4.2 4.6 4.4 4.4 5.6* 5.5* --  CL 87* 88* 93* 93* 94* 94* 92* --  CO2 41* 35* 34* 37* 37* 36* 35* --  GLUCOSE 146* 242* 240* 217* 178* 209* 178* --  BUN 84* 83* 74* 61* 49* 51* 50* --  CREATININE 2.39* 2.77* 2.69* 2.43* 1.94* 2.21* 2.02* --  ALBUMIN 2.7* 2.7* 2.9* -- 3.1* -- -- --  CALCIUM 8.8 8.7 8.6 8.5 8.5 9.1 9.1 --  PHOS 4.3 5.6* 5.8* 5.2* -- -- -- 4.7*  AST -- -- -- -- 13 -- -- --  ALT -- -- -- -- 9 -- -- --   Liver Function Tests:  Lab 09/02/12 0401 09/01/12 0342 08/31/12 0415 08/29/12 0440  AST -- -- -- 13  ALT -- -- -- 9  ALKPHOS -- -- -- 91  BILITOT -- -- -- 0.7  PROT -- -- -- 6.0  ALBUMIN 2.7* 2.7* 2.9* --   No results found for this basename: LIPASE:3,AMYLASE:3 in the last 168 hours No results found for this basename: AMMONIA:3 in the last 168 hours CBC:  Lab 09/02/12 0401 08/31/12 0415 08/30/12 0510 08/29/12 0440 08/28/12 0545 08/27/12 1650  WBC 7.6 9.4 8.9 -- -- --  NEUTROABS -- -- -- -- -- 6.2  HGB 8.4* 8.5* 8.2* -- -- --  HCT 26.3* 27.7* 26.5*  -- -- --  MCV 84.6 87.4 87.2 86.0 86.1 --  PLT 134* 141* 116* -- -- --   Cardiac Enzymes: No results found for this basename: CKTOTAL:5,CKMB:5,CKMBINDEX:5,TROPONINI:5 in the last 168 hours CBG:  Lab 09/02/12 0812 09/01/12 2301 09/01/12 1643 09/01/12 1208 09/01/12 0822  GLUCAP 133* 165* 233* 241* 210*    Iron Studies: No results found for this basename: IRON,TIBC,TRANSFERRIN,FERRITIN in the last 72 hours Studies/Results: Dg Chest Port 1 View  09/01/2012  *RADIOLOGY REPORT*  Clinical Data: Pleural effusions.  PORTABLE CHEST - 1 VIEW  Comparison: Plain film chest 08/28/2012 and 08/30/2012.  Findings: Right worse than left pleural effusions and airspace disease do not appear changed compared to the 08/28/2012 study. There is cardiomegaly.  Right PICC remains in place.  IMPRESSION: No marked change in right worse than left effusions and airspace disease compared to the 08/28/2012 study.   Original Report Authenticated By: Bernadene Bell. D'ALESSIO, M.D.       . antiseptic oral rinse  15 mL Mouth Rinse q12n4p  . chlorhexidine  15 mL Mouth Rinse BID  . diltiazem  90 mg Oral QID  . docusate sodium  100 mg Oral BID  . feeding supplement  30 mL Oral TID WC  . fluticasone  1 puff Inhalation BID  . insulin aspart  0-15 Units Subcutaneous TID WC  . insulin glargine  10 Units Subcutaneous QHS  . isosorbide dinitrate  20 mg Oral TID  . levalbuterol  0.63 mg Nebulization BID  . magnesium citrate  1 Bottle Oral Once  . metolazone  5 mg Oral Daily  . pantoprazole  40 mg Oral Daily  . polyethylene glycol  17 g Oral BID  . potassium chloride  30 mEq Oral Once  . potassium chloride  40 mEq Oral Q2H  . sodium chloride  10-40 mL Intracatheter Q12H  . sotalol  40 mg Oral Once  . sotalol  80 mg Oral BID  . torsemide  100 mg Oral BID  . DISCONTD: polyethylene glycol  17 g Oral Daily    BMET    Component Value Date/Time   NA 132* 09/02/2012 0401   K 3.2* 09/02/2012 0401   CL 87* 09/02/2012 0401   CO2  41* 09/02/2012 0401   GLUCOSE 146* 09/02/2012 0401   BUN 84* 09/02/2012 0401   CREATININE 2.39* 09/02/2012 0401   CALCIUM 8.8 09/02/2012 0401   GFRNONAA 18* 09/02/2012 0401   GFRAA 20* 09/02/2012 0401   CBC    Component Value Date/Time   WBC 7.6 09/02/2012 0401   RBC 3.11* 09/02/2012 0401   HGB 8.4* 09/02/2012 0401   HCT 26.3* 09/02/2012 0401   PLT 134* 09/02/2012 0401   MCV 84.6 09/02/2012 0401   MCH 27.0 09/02/2012 0401   MCHC 31.9 09/02/2012 0401   RDW 16.3* 09/02/2012 0401   LYMPHSABS 0.8 08/27/2012 1650   MONOABS 0.6 08/27/2012 1650   EOSABS 0.2 08/27/2012 1650   BASOSABS 0.0 08/27/2012 1650   Assessment/Plan:  1. AKI/CKD- markedly improved volume status since switching to torsemide 100mg  po bid.  -5 liters over the last 36 hours.  Continue with 100 bid for now but will likely lower dose to 100 qam and 50qpm tomorrow if she continues to has such a brisk response. Pt has advanced CKD stage IV at baseline, now with decompensated RHF, morbid obesity and bilateral leg fractures. Pt is a poor candidate for HD given advanced age, morbid obesity, multiple irreversible chronic medical problems, none of which would improve with RRT, nor her overal QOL. I discussed this with the patient and her daughter. All are in agreement not to attempt RRT. Will try to maximize volume status as well as pulm status and hopefully stabilize renal function prior to proceeding with surgery. Will discuss with Ortho. Need to avoid NSAIDs/COX-II Inhibitors/IV contrast material 2. Hypercapnic respiratory failure/Obesity-hypoventilation syndrome- improved. Cont with O2 via [place] per PCCM 3. Hypokalemia- replete po 4. CHF- mainly right sided. Markedly improved with torsemide.  See above 5. ABLA/ACDz- follow H/H and transfuse prn. Consider EPO 6. A fib- rate-controlled 7. Hip fractures- as above. I discussed case with Dr. Charlann Boxer on 08/31/12 and we agreed to hold off on surgery at this time given decompensated  CHF/hypercapnea and AKI/CKD. Hopefully her overall condition will improve with volume removal. Pain is adequately controlled for now 8. HTN- stable 9. Hyponatremia- due to CHF cont with diuresis 10. DM- poorly controlled, defer to primary svc 11. Dispo- poor QOL with multiple comorbidities. Will likely return to SNF once stable vs LTCF if she improves. Agree with discussion and suggestions of Anders Simmonds,  NP regarding limitations of medical therapy. Palliative care may be required if her clinical course was to worsen. 12.   Deetya Drouillard A

## 2012-09-02 NOTE — Progress Notes (Addendum)
Pt. Seen and examined. Agree with the NP/PA-C note as written.  Seems to be diuresing well. A-fib is rate controlled on current regimen. Would continue diuresis.  Noted that surgery will be delayed. Will continue to follow.  She is complaining of her neuropathic pain returning. Now that her mental status has resolved, could we consider restarting her gabapentin?  Chrystie Nose, MD, [facility] Attending Cardiologist [facility]

## 2012-09-02 NOTE — Progress Notes (Signed)
[facility]  Subjective: Constipated  Objective: Vital signs in last 24 hours: Temp:  [97.6 F (36.4 C)-98.4 F (36.9 C)] 97.8 F (36.6 C) (10/30 0833) Pulse Rate:  [53-101] 87  (10/30 1200) Resp:  [11-18] 14  (10/30 1200) BP: (98-139)/(48-85) 126/83 mmHg (10/30 1200) SpO2:  [94 %-100 %] 97 % (10/30 1200) FiO2 (%):  [40 %] 40 % (10/30 0500) Weight:  [118.4 kg (261 lb 0.4 oz)] 118.4 kg (261 lb 0.4 oz) (10/30 0000) Last BM Date:  (PTA per patient and chart)  Intake/Output from previous day: 10/29 0701 - 10/30 0700 In: 300  Out: 3345 [Urine:3345] Intake/Output this shift: Total I/O In: -  Out: 1600 [Urine:1600]  Medications Current Facility-Administered Medications  Medication Dose Route Frequency Provider Last Rate Last Dose  . antiseptic oral rinse (BIOTENE) solution 15 mL  15 mL Mouth Rinse q12n4p Osvaldo Shipper, MD   15 mL at 09/02/12 1200  . bisacodyl (DULCOLAX) suppository 10 mg  10 mg Rectal Daily PRN Lupita Leash, MD      . chlorhexidine (PERIDEX) 0.12 % solution 15 mL  15 mL Mouth Rinse BID Osvaldo Shipper, MD   15 mL at 09/02/12 0900  . diltiazem (CARDIZEM) tablet 90 mg  90 mg Oral QID Maree Krabbe, MD   90 mg at 09/02/12 1032  . docusate sodium (COLACE) capsule 100 mg  100 mg Oral BID Osvaldo Shipper, MD   100 mg at 09/02/12 1032  . feeding supplement (ENSURE COMPLETE) liquid 237 mL  237 mL Oral BID PRN Jeoffrey Massed, RD      . feeding supplement (PRO-STAT SUGAR FREE 64) liquid 30 mL  30 mL Oral TID WC Jeoffrey Massed, RD   30 mL at 09/02/12 0900  . fluticasone (FLOVENT HFA) 110 MCG/ACT inhaler 1 puff  1 puff Inhalation BID Osvaldo Shipper, MD   1 puff at 09/02/12 1610  . HYDROcodone-acetaminophen (NORCO/VICODIN) 5-325 MG per tablet 1 tablet  1 tablet Oral Q4H PRN Simonne Martinet, NP   1 tablet at 09/02/12 1032  . insulin aspart (novoLOG) injection 0-15 Units  0-15 Units Subcutaneous TID WC Penny Pia, MD   2 Units at 09/02/12  0900  . insulin glargine (LANTUS) injection 10 Units  10 Units Subcutaneous QHS Osvaldo Shipper, MD   10 Units at 09/01/12 2200  . isosorbide dinitrate (ISORDIL) tablet 20 mg  20 mg Oral TID Penny Pia, MD   20 mg at 09/02/12 1031  . levalbuterol (XOPENEX) nebulizer solution 0.63 mg  0.63 mg Nebulization Q6H PRN Penny Pia, MD      . levalbuterol Pauline Aus) nebulizer solution 0.63 mg  0.63 mg Nebulization BID Osvaldo Shipper, MD   0.63 mg at 09/02/12 0830  . magnesium citrate solution 1 Bottle  1 Bottle Oral Once Altha Harm, MD   1 Bottle at 09/02/12 1109  . metolazone (ZAROXOLYN) tablet 5 mg  5 mg Oral Daily Maree Krabbe, MD   5 mg at 09/02/12 1031  . metoprolol (LOPRESSOR) injection 2.5 mg  2.5 mg Intravenous Q12H PRN Rolan Lipa, NP   2.5 mg at 08/29/12 0135  . ondansetron (ZOFRAN) tablet 4 mg  4 mg Oral Q6H PRN Penny Pia, MD       Or  . ondansetron (ZOFRAN) injection 4 mg  4 mg Intravenous Q6H PRN Penny Pia, MD      . pantoprazole (PROTONIX) EC tablet 40 mg  40 mg Oral Daily Denny Peon  Kandy Garrison, PHARMD   40 mg at 09/02/12 1032  . polyethylene glycol (MIRALAX / GLYCOLAX) packet 17 g  17 g Oral BID Osvaldo Shipper, MD   17 g at 09/02/12 1031  . potassium chloride (K-DUR,KLOR-CON) CR tablet 30 mEq  30 mEq Oral Once Caroline More, NP   30 mEq at 09/02/12 1151  . potassium chloride SA (K-DUR,KLOR-CON) CR tablet 40 mEq  40 mEq Oral Q2H Altha Harm, MD      . sodium chloride 0.9 % injection 10-40 mL  10-40 mL Intracatheter Q12H Osvaldo Shipper, MD   10 mL at 09/02/12 1111  . sodium chloride 0.9 % injection 10-40 mL  10-40 mL Intracatheter PRN Osvaldo Shipper, MD      . sodium phosphate (FLEET) 7-19 GM/118ML enema 1 enema  1 enema Rectal Daily PRN Osvaldo Shipper, MD   1 enema at 09/01/12 1823  . sotalol (BETAPACE) tablet 40 mg  40 mg Oral Once Caroline More, NP   40 mg at 09/01/12 2202  . sotalol (BETAPACE) tablet 80 mg  80 mg Oral BID Penny Pia, MD   80 mg at  09/02/12 1032  . torsemide (DEMADEX) tablet 100 mg  100 mg Oral BID Simonne Martinet, NP   100 mg at 09/02/12 1031  . DISCONTD: docusate sodium (COLACE) capsule 100 mg  100 mg Oral BID PRN Penny Pia, MD   100 mg at 09/01/12 1202  . DISCONTD: polyethylene glycol (MIRALAX / GLYCOLAX) packet 17 g  17 g Oral Daily Nada Boozer, NP   17 g at 09/01/12 0948    PE: General appearance: alert, cooperative and no distress Lungs: Decreased BS Heart: irregularly irregular rhythm Abdomen: +BS, nontender, distended. Extremities: 2+ pedal edema. Pulses: 2+ and symmetric Neurologic: Grossly normal  Lab Results:   Basename 09/02/12 0401 08/31/12 0415  WBC 7.6 9.4  HGB 8.4* 8.5*  HCT 26.3* 27.7*  PLT 134* 141*   BMET  Basename 09/02/12 0401 09/01/12 0342 08/31/12 0415  NA 132* 131* 135  K 3.2* 4.2 4.6  CL 87* 88* 93*  CO2 41* 35* 34*  GLUCOSE 146* 242* 240*  BUN 84* 83* 74*  CREATININE 2.39* 2.77* 2.69*  CALCIUM 8.8 8.7 8.6   PT/INR  Basename 09/02/12 0401 09/01/12 0348 08/31/12 0415  LABPROT 19.2* 21.0* 20.9*  INR 1.68* 1.89* 1.88*   09/01/12: PORTABLE CHEST - 1 VIEW  Comparison: Plain film chest 08/28/2012 and 08/30/2012.  Findings: Right worse than left pleural effusions and airspace  disease do not appear changed compared to the 08/28/2012 study.  There is cardiomegaly. Right PICC remains in place.  IMPRESSION:  No marked change in right worse than left effusions and airspace  disease compared to the 08/28/2012 study.    Assessment/Plan  Principal Problem:  *Multiple fractures of both lower extremities Active Problems:  DM  OBSTRUCTIVE SLEEP APNEA  GERD  PAF (paroxysmal atrial fibrillation), was in SR 06/2012   HTN (hypertension)  COPD (chronic obstructive pulmonary disease)  Hyponatremia  CKD (chronic kidney disease)  Pleural effusion  Hypercapnemia  Acute respiratory failure  Chronic diastolic heart failure  Hypokalemia  Pulmonary HTN, Moderate, PA pres   Plan:  Aflutter currently-well controlled. Diltiazem PO 90mg  QID. Sotalol.   BP controlled.  Net fluids: -3.8 L in the last 48 Hrs however, her wt has been increasing. On metolazone and torsemide.    Surgery delayed until ~ Nov. 4.  Normal EF.    LOS: 6 days  Gina Ashley 09/02/2012 12:44 PM

## 2012-09-02 NOTE — Progress Notes (Addendum)
[facility] PROGRESS NOTE  Lynnsie Linders YNW:[id number] DOB: 1927/08/26 DOA: 08/27/2012 PCP: Ernestine Conrad, MD  Assessment/Plan: Principal Problem:  *Multiple fractures of both lower extremities: Pt presently immobilized but not medically stable for surgery yet. Active Problems: Acute on Chronic respiratory failure: This is felt to be multifactorial but owing in large part to pulmonary edema. However pt had successful diuresis over the last 24 hours with 3L U/O. Pt is now only requiring 2 L/min of oxygen.  Will continue diuresis. Would not repeat any more ABG's as this will likely not alter course of treatment.    DM: Blood sugars better today. Will continue to monitor blood sugars and keep on current regimen of Lantus and SSI.   Hyponatremia: Agree that this is likely secondary to excessive fluid. Sodium slightly improved to day. Will continue to monitor.   Acute on CKD (chronic kidney disease): Improved with diuresis. Baseline Cr. 1.7 in august.    OBSTRUCTIVE SLEEP APNEA; For now wil continue with BiPaP instead of CPAP   Pleural effusion: Clinically improved today. Will hold on any further CXR's unless pt has a clinical decline  Constipation: Pt has been using Miralax without results. Will try magnesium citrate today  Hypokalemia: Replete orally  Code Status: DNR Family Communication: Updated daughter Eunice Blase who was at the bedside.  Disposition Plan: Likely SNf at the time of discharge.  MATTHEWS,MICHELLE A.  [facility] Pager [phone] 8PM-8AM, please contact night-coverage at [email or web address], password [facility] 09/02/2012, 9:56 AM  LOS: 6 days   Brief narrative: Pt reportedly from [place] [facility] and had an unwitnessed fall around 4:00 am on day of admission. After the fall patient subsequently developed lower extremity discomfort and was subsequently taken to [facility] ED for further evaluation. History was obtained from daughters in the room and ED records as patient was  given dilaudid for BL LE discomfort and subsequently was "sleepy" per family. In the room initially ortho technician was at bedside placing braces and patient had discomfort with manipulation of lower extremities while placing gauze. Since admission, her major issues that has led to a delay in surgical repair of fractures are respiratory failure and renal failure with fluid overload.   Consultants: Dr. Army Melia- Critical Care Dr. Colodonato-Nephrology Dr. Charlann Boxer- Orthopedic Surgery  Procedures:  None  Antibiotics:  None  HPI/Subjective: Pt states that she feels much better today. Her daughter who is at the bedside endorses that pt looks better and is breathing better today. Pt states that she feels constipated.  Objective: Filed Vitals:   09/02/12 0700 09/02/12 0800 09/02/12 0833 09/02/12 0900  BP: 107/63 109/55  139/85  Pulse: 60 63  71  Temp:   97.8 F (36.6 C)   TempSrc:   Axillary   Resp: 13 16  11   Height:      Weight:      SpO2: 98% 98% 94% 98%   Weight change: 0.8 kg (1 lb 12.2 oz)  Intake/Output Summary (Last 24 hours) at 09/02/12 0956 Last data filed at 09/02/12 0900  Gross per 24 hour  Intake    260 ml  Output   3195 ml  Net  -2935 ml    General: Alert, awake, oriented x3, in no acute distress.  HEENT: Leslie/AT PEERL, EOMI Neck: Trachea midline,  no masses, no thyromegal,y no JVD, no carotid bruit OROPHARYNX:  Moist, No exudate/ erythema/lesions.  Heart: Regular rate and rhythm, without murmurs, rubs, gallops, PMI non-displaced, no heaves or thrills on palpation.  Lungs: Mild bibasilar crackles noted.  No increased vocal fremitus resonant to percussion  Abdomen: Soft, nontender, nondistended, positive bowel sounds, no masses no hepatosplenomegaly noted..  Neuro: No focal neurological deficits noted . Musculoskeletal: No warm swelling or erythema around joints, no spinal tenderness noted.   Data Reviewed: Basic Metabolic Panel:  Lab 09/02/12 1610 09/01/12 0342  08/31/12 0415 08/30/12 0510 08/29/12 0440 08/27/12 2110  NA 132* 131* 135 136 136 --  K 3.2* 4.2 4.6 4.4 4.4 --  CL 87* 88* 93* 93* 94* --  CO2 41* 35* 34* 37* 37* --  GLUCOSE 146* 242* 240* 217* 178* --  BUN 84* 83* 74* 61* 49* --  CREATININE 2.39* 2.77* 2.69* 2.43* 1.94* --  CALCIUM 8.8 8.7 8.6 8.5 8.5 --  MG -- -- -- -- -- 2.3  PHOS 4.3 5.6* 5.8* 5.2* -- 4.7*   Liver Function Tests:  Lab 09/02/12 0401 09/01/12 0342 08/31/12 0415 08/29/12 0440  AST -- -- -- 13  ALT -- -- -- 9  ALKPHOS -- -- -- 91  BILITOT -- -- -- 0.7  PROT -- -- -- 6.0  ALBUMIN 2.7* 2.7* 2.9* 3.1*   No results found for this basename: LIPASE:5,AMYLASE:5 in the last 168 hours No results found for this basename: AMMONIA:5 in the last 168 hours CBC:  Lab 09/02/12 0401 08/31/12 0415 08/30/12 0510 08/29/12 0440 08/28/12 0545 08/27/12 1650  WBC 7.6 9.4 8.9 8.2 8.6 --  NEUTROABS -- -- -- -- -- 6.2  HGB 8.4* 8.5* 8.2* 8.4* 7.5* --  HCT 26.3* 27.7* 26.5* 27.0* 24.8* --  MCV 84.6 87.4 87.2 86.0 86.1 --  PLT 134* 141* 116* 100* 121* --   Cardiac Enzymes: No results found for this basename: CKTOTAL:5,CKMB:5,CKMBINDEX:5,TROPONINI:5 in the last 168 hours BNP (last 3 results) No results found for this basename: PROBNP:3 in the last 8760 hours CBG:  Lab 09/02/12 0812 09/01/12 2301 09/01/12 1643 09/01/12 1208 09/01/12 0822  GLUCAP 133* 165* 233* 241* 210*    Recent Results (from the past 240 hour(s))  MRSA PCR SCREENING     Status: Normal   Collection Time   08/23/12  6:01 PM      Component Value Range Status Comment   MRSA by PCR NEGATIVE  NEGATIVE Final   URINE CULTURE     Status: Normal   Collection Time   08/27/12  4:04 PM      Component Value Range Status Comment   Specimen Description URINE, CATHETERIZED   Final    Special Requests NONE   Final    Culture  Setup Time 08/28/2012 01:23   Final    Colony Count NO GROWTH   Final    Culture NO GROWTH   Final    Report Status 08/28/2012 FINAL   Final        Studies: Dg Chest 1 View  08/27/2012  *RADIOLOGY REPORT*  Clinical Data: Post fall, history of atrial fibrillation  CHEST - 1 VIEW  Comparison: 07/26/2012; 07/25/2012; 07/18/2012  Findings:  Grossly unchanged enlarged cardiac silhouette and mediastinal contours.  Interval development of a moderate-sized right-sided pleural effusion.  Pulmonary vasculature remains indistinct with cephalization of flow.  No definite left-sided pleural effusion. No definite pneumothorax.  Calcification overlying the left lower neck may be within the left lobe of the thyroid.  No definite acute osseous abnormalities.  IMPRESSION: 1.  Interval development of a moderate-sized right-sided pleural effusion with persistent findings of mild pulmonary edema.  Further evaluation with a PA and lateral chest radiograph may be obtained  as clinically indicated. 2.  Possible calcification within the left lobe of the thyroid. Further evaluation with thyroid ultrasound may be performed in the non emergent setting as clinically indicated   Original Report Authenticated By: Waynard Reeds, M.D.    Dg Femur Right  08/27/2012  *RADIOLOGY REPORT*  Clinical Data: Post fall, now with severe pain and tenderness involving the distal aspect of the femur  RIGHT FEMUR - 2 VIEW  Comparison: None.  Findings:  There is an oblique, minimally displaced, slightly impacted fracture of the distal femoral metaphysis.  There is no definitive extension of the fracture into the right total knee replacement hardware.  No definite evidence of hardware failure or loosening. No definite suprapatellar joint effusion, though evaluation degraded due to technique.  No radiopaque foreign body.  IMPRESSION: Oblique, minimally displaced, slightly impacted fracture of the distal femoral metaphysis without definite extension to involve the femoral component of the right total knee replacement.   Original Report Authenticated By: Waynard Reeds, M.D.    Dg Tibia/fibula  Left  08/27/2012  *RADIOLOGY REPORT*  Clinical Data: Post fall, now with leg pain  LEFT TIBIA AND FIBULA - 2 VIEW  Comparison: None.  Findings: There is an oblique, displaced fracture of the proximal tibial metaphysis with apparent extension to involve the tibial component of the left total knee hardware, which appears minimally angulated, apex ventral.  This is the adjacent soft tissue swelling.  No radiopaque foreign body.  Vascular calcifications.  IMPRESSION: Oblique, displaced fracture of the proximal tibial metaphysis with extension to involve the tibial component of the left total knee replacement hardware.   Original Report Authenticated By: Waynard Reeds, M.D.    Ct Head Wo Contrast  08/23/2012  *RADIOLOGY REPORT*  Clinical Data: Tremors and shaking  CT HEAD WITHOUT CONTRAST  Technique:  Contiguous axial images were obtained from the base of the skull through the vertex without contrast.  Comparison: 06/27/2012  Findings: Stable for mild age white matter hypodensity. There is prominence of the sulci and ventricles consistent with brain atrophy.  There is no evidence for acute brain infarct, hemorrhage or mass.  The paranasal sinuses and mastoid air cells are clear.  The skull appears intact.  IMPRESSION:  1.  No acute intracranial abnormalities.   Original Report Authenticated By: Rosealee Albee, M.D.    US Renal Port  08/30/2012  *RADIOLOGY REPORT*  Clinical Data: Acute renal failure  RENAL/URINARY TRACT ULTRASOUND COMPLETE  Comparison: CT 05/22/2012  Findings:  Right Kidney = 10.3 cm.  No hydronephrosis or mass.  A small simple cyst in upper pole.  Left kidney = Not well visualized. Bladder:  Foley catheter placed in the  IMPRESSION:  Normal right kidney.  Left kidney is poorly assessed.   Original Report Authenticated By: Genevive Bi, M.D.    Dg Chest Port 1 View  09/01/2012  *RADIOLOGY REPORT*  Clinical Data: Pleural effusions.  PORTABLE CHEST - 1 VIEW  Comparison: Plain film chest  08/28/2012 and 08/30/2012.  Findings: Right worse than left pleural effusions and airspace disease do not appear changed compared to the 08/28/2012 study. There is cardiomegaly.  Right PICC remains in place.  IMPRESSION: No marked change in right worse than left effusions and airspace disease compared to the 08/28/2012 study.   Original Report Authenticated By: Bernadene Bell. Maricela Curet, M.D.    Dg Chest Port 1 View  08/30/2012  *RADIOLOGY REPORT*  Clinical Data: Fall pleural effusions  PORTABLE CHEST - 1  VIEW  Comparison: Chest radiograph 08/28/2012  Findings: Stable enlarged heart silhouette.  There is a right PICC line in place.  There is generalized central venous congestion and low lung volumes with small pleural effusions. Effusions are improved.  No pneumothorax.  IMPRESSION:  1.  Low lung volumes and pleural effusions with central venous congestion suggest congestive heart failure.  2.  Effusions are slightly improved.   Original Report Authenticated By: Genevive Bi, M.D.    Dg Chest Port 1 View  08/28/2012  *RADIOLOGY REPORT*  Clinical Data: Confirm the line placement.  Status post PICC placement.  PORTABLE CHEST - 1 VIEW  Comparison: 08/27/2012  Findings: The patient is slightly rotated to the left.  Right upper extremity PICC projects over the distal superior vena cava.  Its tip is approximately 3 cm inferior to the carina.  Cardiomegaly, pulmonary vascular congestion, and pulmonary edema persist.  There are bilateral pleural effusions, right greater than left.  No evidence of pneumothorax.  Coarse, chunky calcification in the left neck is unchanged compared to cervical spine radiographs of 2002.  Question if this could be a calcification within the left thyroid lobe.  IMPRESSION:  1. The distal tip of the right upper extremity PICC projects over the distal superior vena cava, in satisfactory position. 2.  Congestive heart failure pattern with bilateral pleural effusions, right greater than left.    Original Report Authenticated By: Britta Mccreedy, M.D.     Scheduled Meds:   . antiseptic oral rinse  15 mL Mouth Rinse q12n4p  . chlorhexidine  15 mL Mouth Rinse BID  . diltiazem  90 mg Oral QID  . docusate sodium  100 mg Oral BID  . feeding supplement  30 mL Oral TID WC  . fluticasone  1 puff Inhalation BID  . insulin aspart  0-15 Units Subcutaneous TID WC  . insulin glargine  10 Units Subcutaneous QHS  . isosorbide dinitrate  20 mg Oral TID  . levalbuterol  0.63 mg Nebulization BID  . metolazone  5 mg Oral Daily  . pantoprazole  40 mg Oral Daily  . polyethylene glycol  17 g Oral BID  . potassium chloride  30 mEq Oral Once  . sodium chloride  10-40 mL Intracatheter Q12H  . sotalol  40 mg Oral Once  . sotalol  80 mg Oral BID  . torsemide  100 mg Oral BID  . DISCONTD: polyethylene glycol  17 g Oral Daily   Continuous Infusions:   Principal Problem:  *Multiple fractures of both lower extremities Active Problems:  DM  OBSTRUCTIVE SLEEP APNEA  GERD  PAF (paroxysmal atrial fibrillation), was in SR 06/2012   HTN (hypertension)  COPD (chronic obstructive pulmonary disease)  Hyponatremia  CKD (chronic kidney disease)  Pleural effusion  Hypercapnemia  Acute respiratory failure  Chronic diastolic heart failure

## 2012-09-02 NOTE — Progress Notes (Addendum)
Name: Gina Ashley MRN: [id number] DOB: [birth year]    LOS: 6  Referring Provider:  Rito Ehrlich Reason for Referral:  AMS  PULMONARY / CRITICAL CARE MEDICINE   Brief patient description:  This is a 78 year old female w/ multiple co-morbids including: CRF stage IV, AF on coumadin, OSA (wears CPAP at HS). Recently transferred to SNF after ~ 2 week stay at [facility] for acute on chronic renal failure. Had unwitnessed fall on 10/24 resulting in BLE fractures. Admitted by medical service rx has included pain management, IV lasix gtt for volume overload and ortho consult. PCCM asked to see on 10/28 for AMS and concern about hypercarbia.   Events Since Admission: More awake. No complaints.   Current Status: gaurded  Vital Signs: Temp:  [97.6 F (36.4 C)-98.6 F (37 C)] 97.8 F (36.6 C) (10/30 0833) Pulse Rate:  [53-106] 71  (10/30 0900) Resp:  [10-18] 11  (10/30 0900) BP: (98-139)/(48-85) 139/85 mmHg (10/30 0900) SpO2:  [94 %-100 %] 98 % (10/30 0900) FiO2 (%):  [40 %] 40 % (10/30 0500) Weight:  [118.4 kg (261 lb 0.4 oz)] 118.4 kg (261 lb 0.4 oz) (10/30 0000) 2 liters n/c Physical Examination: General:  Chronically ill appearing 78 year old female. More awake  Neuro:  Awake, oriented X 3. Denies pain.  HEENT:  + JVD, MM dry Cardiovascular:  Regular irregular  Lungs:  Decreased t/o Abdomen:  No OM, + bowel sounds  Musculoskeletal:  LE pain Skin:  + anasarca   Principal Problem:  *Multiple fractures of both lower extremities Active Problems:  DM  OBSTRUCTIVE SLEEP APNEA  GERD  PAF (paroxysmal atrial fibrillation), was in SR 06/2012   HTN (hypertension)  COPD (chronic obstructive pulmonary disease)  Hyponatremia  CKD (chronic kidney disease)  Pleural effusion  Hypercapnemia  Acute respiratory failure  Chronic diastolic heart failure   ASSESSMENT AND PLAN  PULMONARY  Lab 09/01/12 0524 08/31/12 0938 08/28/12 1615 08/28/12 0844  PHART 7.318* 7.312* 7.385 7.262*  PCO2ART  70.0* 69.6* 60.5* 79.8*  PO2ART 86.5 89.7 141.0* 77.1*  HCO3 34.9* 34.2* 35.4* 34.8*  O2SAT 97.5 97.5 99.8 95.3   Ventilator Settings: Vent Mode:  [-]  FiO2 (%):  [40 %] 40 % CXR:  Bilateral airspace disease, R>L. C/W edema and low volume. Decreased aeration c/w exam on 10/29 ETT:    A:   Acute Hypercarbic Respiratory Failure: in setting of narcotic effect superimposed on underlying OSA and CRF Bilateral atelectasis Pulmonary edema/volume excess +/- element of effusion  Renal fxn seems to have hit plateau.  P:   Cont BIPAP PRN Try to find the smallest, most effective dose for analgesia Cont current diuresis  F/u cxr   CARDIOVASCULAR No results found for this basename: TROPONINI:5,LATICACIDVEN:5, O2SATVEN:5,PROBNP:5 in the last 168 hours ECG:  afib Lines: ECHO 10/21: estimated ejection fraction was in the range of 55% to 60%. Wall motion was normal; there were no regional wall motion abnormalities. The study is not technically sufficient to allow evaluation of LV diastolic function. A:  AFIB HTN Pulmonary edema  Both fib and HTN controlled. Think that edema more a component of renal failure, and perhaps element of diastolic dysfunction.  P:  Cont tele monitoring Cont diuresis  BIPAP should also help w/ edema   RENAL  Lab 09/02/12 0401 09/01/12 0342 08/31/12 0415 08/30/12 0510 08/29/12 0440 08/27/12 2110  NA 132* 131* 135 136 136 --  K 3.2* 4.2 -- -- -- --  CL 87* 88* 93* 93* 94* --  CO2 41* 35* 34* 37* 37* --  BUN 84* 83* 74* 61* 49* --  CREATININE 2.39* 2.77* 2.69* 2.43* 1.94* --  CALCIUM 8.8 8.7 8.6 8.5 8.5 --  MG -- -- -- -- -- 2.3  PHOS 4.3 5.6* 5.8* 5.2* -- 4.7*   Intake/Output      10/29 0701 - 10/30 0700 10/30 0701 - 10/31 0700   Other 300    IV Piggyback     Total Intake(mL/kg) 300 (2.5)    Urine (mL/kg/hr) 3345 (1.2) 350   Total Output 3345 350   Net -3045 -350        Stool Occurrence 1 x     Foley:  10/24 Renal US 10/27: right kidney WNL, left  not well assessed.   A:   Acute on Chronic renal failure.  Hypokalemia  Followed by nephrology, she is lasix resistant. Changed to demadex w/ what appears to be a favorable response.  P:   Replaced K Avoid nephrotoxins.  Renal adjust meds F/u chemistry q 12  GASTROINTESTINAL  Lab 09/02/12 0401 09/01/12 0342 08/31/12 0415 08/29/12 0440  AST -- -- -- 13  ALT -- -- -- 9  ALKPHOS -- -- -- 91  BILITOT -- -- -- 0.7  PROT -- -- -- 6.0  ALBUMIN 2.7* 2.7* 2.9* 3.1*    A:   Obesity Malnutrition  Has been acutely on chronically ill for several weeks now.  P:   Advance diet as tolerated Recommenced Ensure Complete to be added   HEMATOLOGIC  Lab 09/02/12 0401 09/01/12 0348 08/31/12 0415 08/30/12 0510 08/29/12 1010 08/29/12 0440 08/28/12 0545  HGB 8.4* -- 8.5* 8.2* -- 8.4* 7.5*  HCT 26.3* -- 27.7* 26.5* -- 27.0* 24.8*  PLT 134* -- 141* 116* -- 100* 121*  INR 1.68* 1.89* 1.88* 2.35* 3.17* -- --  APTT -- -- -- -- -- -- --   A:   Anemia of Chronic disease.  Coumadin induced coagulopathy  Got 1 unit PRBC on 10/25. No evidence of active bleeding.  P:  Trend CBC Trigger to transfuse < 7 PPI Hold coumadin  Add Joshua heparin for DVT prophylaxis when INR < 1.5 No SCDs as will contribute to pain given fracture location  INFECTIOUS  Lab 09/02/12 0401 09/01/12 0348 08/31/12 0415 08/30/12 0510 08/29/12 0440 08/28/12 0545  WBC 7.6 -- 9.4 8.9 8.2 8.6  PROCALCITON <0.10 0.12 <0.10 -- -- --   Cultures: Antibiotics:   A:  No current evidence of infection.  P:   Supportive care Trend WBC  ENDOCRINE  Lab 09/02/12 0812 09/01/12 2301 09/01/12 1643 09/01/12 1208 09/01/12 0822  GLUCAP 133* 165* 233* 241* 210*   A:   DM w/ hyperglycemia P:   Cont SSI Needs adjustment in basal insulin  NEUROLOGIC  A:  Acute Encephalopathy (resolved) Most likely this is a mix of hypercarbia and Narcotics.  This is much better.  P:   Supportive care Minimal narcotics  D/c gabapentin   Goals  of care Spoke at length with the pt and two of her daughters. See note on 10/29, full DNR Not candidate for HD  BEST PRACTICE / DISPOSITION Level of Care:  SDU Primary Service:  [facility]:  PCCM Code Status:  Full-->DNR Diet:   DVT Px:  Was on coumadin, Will start [place] heparin when INR < 1.5 GI Px:  PPI Skin Integrity:  intact Social / Family:  Updated.   BABCOCK,PETE,  09/02/2012, 9:59 AM     Attending:  I have  seen and examined the patient with nurse practitioner/resident and agree with the note above.   Improving Kidney function; improved mentation;  Avoid lasix, daughters say it has not worked for months, torsemide only loop that works consistently.  Add dulcolax; avoid fleet's with CKD.  Yolonda Kida PCCM Pager: [phone] Cell: [phone] If no response, call [phone]

## 2012-09-03 DIAGNOSIS — I4891 Unspecified atrial fibrillation: Secondary | ICD-10-CM

## 2012-09-03 LAB — RENAL FUNCTION PANEL
BUN: 89 mg/dL — ABNORMAL HIGH (ref 6–23)
CO2: 45 mEq/L (ref 19–32)
Calcium: 9.3 mg/dL (ref 8.4–10.5)
Creatinine, Ser: 1.91 mg/dL — ABNORMAL HIGH (ref 0.50–1.10)
Glucose, Bld: 179 mg/dL — ABNORMAL HIGH (ref 70–99)
Phosphorus: 3 mg/dL (ref 2.3–4.6)
Sodium: 135 mEq/L (ref 135–145)

## 2012-09-03 LAB — GLUCOSE, CAPILLARY
Glucose-Capillary: 243 mg/dL — ABNORMAL HIGH (ref 70–99)
Glucose-Capillary: 225 mg/dL — ABNORMAL HIGH (ref 70–99)

## 2012-09-03 LAB — HEPARIN LEVEL (UNFRACTIONATED): Heparin Unfractionated: 0.41 IU/mL (ref 0.30–0.70)

## 2012-09-03 MED ORDER — LORAZEPAM 0.5 MG PO TABS
0.5000 mg | ORAL_TABLET | Freq: Once | ORAL | Status: AC
  Administered 2012-09-04: 0.5 mg via ORAL
  Filled 2012-09-03: qty 1

## 2012-09-03 MED ORDER — TORSEMIDE 100 MG PO TABS
100.0000 mg | ORAL_TABLET | Freq: Every day | ORAL | Status: DC
  Filled 2012-09-03: qty 1

## 2012-09-03 MED ORDER — TRAMADOL 5 MG/ML ORAL SUSPENSION
25.0000 mg | Freq: Four times a day (QID) | ORAL | Status: DC | PRN
  Filled 2012-09-03: qty 5

## 2012-09-03 MED ORDER — TRAMADOL HCL 50 MG PO TABS
25.0000 mg | ORAL_TABLET | Freq: Four times a day (QID) | ORAL | Status: DC | PRN
  Administered 2012-09-03 – 2012-09-08 (×11): 25 mg via ORAL
  Filled 2012-09-03 (×11): qty 1

## 2012-09-03 MED ORDER — HEPARIN (PORCINE) IN NACL 100-0.45 UNIT/ML-% IJ SOLN
1300.0000 [IU]/h | INTRAMUSCULAR | Status: DC
  Administered 2012-09-03 – 2012-09-04 (×2): 1100 [IU]/h via INTRAVENOUS
  Administered 2012-09-05: 1300 [IU]/h via INTRAVENOUS
  Filled 2012-09-03 (×5): qty 250

## 2012-09-03 MED ORDER — TORSEMIDE 20 MG PO TABS
50.0000 mg | ORAL_TABLET | Freq: Every evening | ORAL | Status: DC
  Administered 2012-09-03: 50 mg via ORAL
  Filled 2012-09-03 (×2): qty 1

## 2012-09-03 MED ORDER — HYDROCODONE-ACETAMINOPHEN 5-325 MG PO TABS
2.0000 | ORAL_TABLET | ORAL | Status: DC | PRN
  Administered 2012-09-03 – 2012-09-08 (×20): 2 via ORAL
  Filled 2012-09-03 (×5): qty 2
  Filled 2012-09-03: qty 1
  Filled 2012-09-03: qty 2
  Filled 2012-09-03: qty 1
  Filled 2012-09-03 (×15): qty 2

## 2012-09-03 NOTE — Progress Notes (Signed)
[facility] PROGRESS NOTE  Gina Ashley WUJ:[id number] DOB: [birth year] DOA: 08/27/2012 PCP: Ernestine Conrad, MD  Assessment/Plan: Principal Problem:  *Multiple fractures of both lower extremities: Pt presently immobilized but not medically stable for surgery yet.  Active Problems: Acute on Chronic respiratory failure: This is felt to be multifactorial but owing in large part to pulmonary edema. However pt had successful diuresis over the last 24 hours with 3L U/O. Pt is now only requiring 2 L/min of oxygen.  Diuretics adjusted today. Patient stable from a respiratory standpoint.   DM: Blood sugars better today. Will continue to monitor blood sugars and keep on current regimen of Lantus and SSI.   Hyponatremia: Improved now within normal range there   Acute on CKD (chronic kidney disease): Patient almost back at baseline Cr. 1.7.    OBSTRUCTIVE SLEEP APNEA; For now wil continue with BiPaP instead of CPAP   Pleural effusion: Clinically improved today. Will hold on any further CXR's unless pt has a clinical decline  Constipation: Pt has been using Miralax without results. Will try magnesium citrate today  Hypokalemia: Improved  Code Status: DNR Family Communication: Updated daughter Eunice Blase who was at the bedside.  Disposition Plan: Likely SNf at the time of discharge.  Sweden Lesure A.  [facility] Pager [phone] 8PM-8AM, please contact night-coverage at [email or web address], password [facility] 09/03/2012, 6:43 PM  LOS: 7 days   Brief narrative: Pt reportedly from [facility] and had an unwitnessed fall around 4:00 am on day of admission. After the fall patient subsequently developed lower extremity discomfort and was subsequently taken to [facility] ED for further evaluation. History was obtained from daughters in the room and ED records as patient was given dilaudid for BL LE discomfort and subsequently was "sleepy" per family. In the room initially ortho technician was at  bedside placing braces and patient had discomfort with manipulation of lower extremities while placing gauze. Since admission, her major issues that has led to a delay in surgical repair of fractures are respiratory failure and renal failure with fluid overload.   Consultants: Dr. Army Melia- Critical Care Dr. Colodonato-Nephrology Dr. Charlann Boxer- Orthopedic Surgery  Procedures:  None  Antibiotics:  None  HPI/Subjective: Pt states that she feels much better today. She does however have complaints of excruciating pain in her bilateral lower extremities. I spoke with Dr. Bluford Kaufmann in his office and he will not be available to perform surgery until Monday.  Objective: Filed Vitals:   09/03/12 1100 09/03/12 1200 09/03/12 1600 09/03/12 1710  BP:  145/87  122/88  Pulse: 72 71    Temp:  98.4 F (36.9 C) 98.1 F (36.7 C)   TempSrc:  Oral Oral   Resp: 17 15    Height:      Weight:      SpO2: 95% 96%     Weight change: -5.5 kg (-12 lb 2 oz)  Intake/Output Summary (Last 24 hours) at 09/03/12 1843 Last data filed at 09/03/12 1500  Gross per 24 hour  Intake     43 ml  Output   3010 ml  Net  -2967 ml    General: Alert, awake, oriented x3, in no acute distress.  HEENT: [place]/AT PEERL, EOMI Neck: Trachea midline,  no masses, no thyromegal,y no JVD, no carotid bruit OROPHARYNX:  Moist, No exudate/ erythema/lesions.  Heart: Regular rate and rhythm, without murmurs, rubs, gallops, PMI non-displaced, no heaves or thrills on palpation.  Lungs: Mild bibasilar crackles noted. No increased vocal fremitus resonant to percussion  Abdomen: Soft,  nontender, nondistended, positive bowel sounds, no masses no hepatosplenomegaly noted..  Neuro: No focal neurological deficits noted . Musculoskeletal: No warm swelling or erythema around joints, no spinal tenderness noted.   Data Reviewed: Basic Metabolic Panel:  Lab 09/03/12 4098 09/02/12 0401 09/01/12 0342 08/31/12 0415 08/30/12 0510 08/27/12 2110  NA 135 132*  131* 135 136 --  K 3.5 3.2* 4.2 4.6 4.4 --  CL 86* 87* 88* 93* 93* --  CO2 45* 41* 35* 34* 37* --  GLUCOSE 179* 146* 242* 240* 217* --  BUN 89* 84* 83* 74* 61* --  CREATININE 1.91* 2.39* 2.77* 2.69* 2.43* --  CALCIUM 9.3 8.8 8.7 8.6 8.5 --  MG -- -- -- -- -- 2.3  PHOS 3.0 4.3 5.6* 5.8* 5.2* --   Liver Function Tests:  Lab 09/03/12 0455 09/02/12 0401 09/01/12 0342 08/31/12 0415 08/29/12 0440  AST -- -- -- -- 13  ALT -- -- -- -- 9  ALKPHOS -- -- -- -- 91  BILITOT -- -- -- -- 0.7  PROT -- -- -- -- 6.0  ALBUMIN 2.7* 2.7* 2.7* 2.9* 3.1*   No results found for this basename: LIPASE:5,AMYLASE:5 in the last 168 hours No results found for this basename: AMMONIA:5 in the last 168 hours CBC:  Lab 09/02/12 0401 08/31/12 0415 08/30/12 0510 08/29/12 0440 08/28/12 0545  WBC 7.6 9.4 8.9 8.2 8.6  NEUTROABS -- -- -- -- --  HGB 8.4* 8.5* 8.2* 8.4* 7.5*  HCT 26.3* 27.7* 26.5* 27.0* 24.8*  MCV 84.6 87.4 87.2 86.0 86.1  PLT 134* 141* 116* 100* 121*   Cardiac Enzymes: No results found for this basename: CKTOTAL:5,CKMB:5,CKMBINDEX:5,TROPONINI:5 in the last 168 hours BNP (last 3 results) No results found for this basename: PROBNP:3 in the last 8760 hours CBG:  Lab 09/03/12 1147 09/03/12 0743 09/02/12 2251 09/02/12 1744 09/02/12 1242  GLUCAP 254* 167* 257* 241* 205*    Recent Results (from the past 240 hour(s))  URINE CULTURE     Status: Normal   Collection Time   08/27/12  4:04 PM      Component Value Range Status Comment   Specimen Description URINE, CATHETERIZED   Final    Special Requests NONE   Final    Culture  Setup Time 08/28/2012 01:23   Final    Colony Count NO GROWTH   Final    Culture NO GROWTH   Final    Report Status 08/28/2012 FINAL   Final      Studies: Dg Chest 1 View  08/27/2012  *RADIOLOGY REPORT*  Clinical Data: Post fall, history of atrial fibrillation  CHEST - 1 VIEW  Comparison: 07/26/2012; 07/25/2012; 07/18/2012  Findings:  Grossly unchanged enlarged cardiac  silhouette and mediastinal contours.  Interval development of a moderate-sized right-sided pleural effusion.  Pulmonary vasculature remains indistinct with cephalization of flow.  No definite left-sided pleural effusion. No definite pneumothorax.  Calcification overlying the left lower neck may be within the left lobe of the thyroid.  No definite acute osseous abnormalities.  IMPRESSION: 1.  Interval development of a moderate-sized right-sided pleural effusion with persistent findings of mild pulmonary edema.  Further evaluation with a PA and lateral chest radiograph may be obtained as clinically indicated. 2.  Possible calcification within the left lobe of the thyroid. Further evaluation with thyroid ultrasound may be performed in the non emergent setting as clinically indicated   Original Report Authenticated By: Waynard Reeds, M.D.    Dg Femur Right  08/27/2012  *RADIOLOGY  REPORT*  Clinical Data: Post fall, now with severe pain and tenderness involving the distal aspect of the femur  RIGHT FEMUR - 2 VIEW  Comparison: None.  Findings:  There is an oblique, minimally displaced, slightly impacted fracture of the distal femoral metaphysis.  There is no definitive extension of the fracture into the right total knee replacement hardware.  No definite evidence of hardware failure or loosening. No definite suprapatellar joint effusion, though evaluation degraded due to technique.  No radiopaque foreign body.  IMPRESSION: Oblique, minimally displaced, slightly impacted fracture of the distal femoral metaphysis without definite extension to involve the femoral component of the right total knee replacement.   Original Report Authenticated By: Waynard Reeds, M.D.    Dg Tibia/fibula Left  08/27/2012  *RADIOLOGY REPORT*  Clinical Data: Post fall, now with leg pain  LEFT TIBIA AND FIBULA - 2 VIEW  Comparison: None.  Findings: There is an oblique, displaced fracture of the proximal tibial metaphysis with apparent  extension to involve the tibial component of the left total knee hardware, which appears minimally angulated, apex ventral.  This is the adjacent soft tissue swelling.  No radiopaque foreign body.  Vascular calcifications.  IMPRESSION: Oblique, displaced fracture of the proximal tibial metaphysis with extension to involve the tibial component of the left total knee replacement hardware.   Original Report Authenticated By: Waynard Reeds, M.D.    Ct Head Wo Contrast  08/23/2012  *RADIOLOGY REPORT*  Clinical Data: Tremors and shaking  CT HEAD WITHOUT CONTRAST  Technique:  Contiguous axial images were obtained from the base of the skull through the vertex without contrast.  Comparison: 06/27/2012  Findings: Stable for mild age white matter hypodensity. There is prominence of the sulci and ventricles consistent with brain atrophy.  There is no evidence for acute brain infarct, hemorrhage or mass.  The paranasal sinuses and mastoid air cells are clear.  The skull appears intact.  IMPRESSION:  1.  No acute intracranial abnormalities.   Original Report Authenticated By: Rosealee Albee, M.D.    US Renal Port  08/30/2012  *RADIOLOGY REPORT*  Clinical Data: Acute renal failure  RENAL/URINARY TRACT ULTRASOUND COMPLETE  Comparison: CT 05/22/2012  Findings:  Right Kidney = 10.3 cm.  No hydronephrosis or mass.  A small simple cyst in upper pole.  Left kidney = Not well visualized. Bladder:  Foley catheter placed in the  IMPRESSION:  Normal right kidney.  Left kidney is poorly assessed.   Original Report Authenticated By: Genevive Bi, M.D.    Dg Chest Port 1 View  09/01/2012  *RADIOLOGY REPORT*  Clinical Data: Pleural effusions.  PORTABLE CHEST - 1 VIEW  Comparison: Plain film chest 08/28/2012 and 08/30/2012.  Findings: Right worse than left pleural effusions and airspace disease do not appear changed compared to the 08/28/2012 study. There is cardiomegaly.  Right PICC remains in place.  IMPRESSION: No marked  change in right worse than left effusions and airspace disease compared to the 08/28/2012 study.   Original Report Authenticated By: Bernadene Bell. Maricela Curet, M.D.    Dg Chest Port 1 View  08/30/2012  *RADIOLOGY REPORT*  Clinical Data: Fall pleural effusions  PORTABLE CHEST - 1 VIEW  Comparison: Chest radiograph 08/28/2012  Findings: Stable enlarged heart silhouette.  There is a right PICC line in place.  There is generalized central venous congestion and low lung volumes with small pleural effusions. Effusions are improved.  No pneumothorax.  IMPRESSION:  1.  Low lung volumes and  pleural effusions with central venous congestion suggest congestive heart failure.  2.  Effusions are slightly improved.   Original Report Authenticated By: Genevive Bi, M.D.    Dg Chest Port 1 View  08/28/2012  *RADIOLOGY REPORT*  Clinical Data: Confirm the line placement.  Status post PICC placement.  PORTABLE CHEST - 1 VIEW  Comparison: 08/27/2012  Findings: The patient is slightly rotated to the left.  Right upper extremity PICC projects over the distal superior vena cava.  Its tip is approximately 3 cm inferior to the carina.  Cardiomegaly, pulmonary vascular congestion, and pulmonary edema persist.  There are bilateral pleural effusions, right greater than left.  No evidence of pneumothorax.  Coarse, chunky calcification in the left neck is unchanged compared to cervical spine radiographs of 2002.  Question if this could be a calcification within the left thyroid lobe.  IMPRESSION:  1. The distal tip of the right upper extremity PICC projects over the distal superior vena cava, in satisfactory position. 2.  Congestive heart failure pattern with bilateral pleural effusions, right greater than left.   Original Report Authenticated By: Britta Mccreedy, M.D.     Scheduled Meds:    . antiseptic oral rinse  15 mL Mouth Rinse q12n4p  . chlorhexidine  15 mL Mouth Rinse BID  . diltiazem  90 mg Oral QID  . docusate sodium  100 mg  Oral BID  . feeding supplement  30 mL Oral TID WC  . fluticasone  1 puff Inhalation BID  . insulin aspart  0-15 Units Subcutaneous TID WC  . insulin glargine  10 Units Subcutaneous QHS  . isosorbide dinitrate  20 mg Oral TID  . levalbuterol  0.63 mg Nebulization BID  . pantoprazole  40 mg Oral Daily  . polyethylene glycol  17 g Oral BID  . sodium chloride  10-40 mL Intracatheter Q12H  . sotalol  80 mg Oral BID  . torsemide  100 mg Oral Daily  . torsemide  50 mg Oral QPM  . DISCONTD: heparin subcutaneous  5,000 Units Subcutaneous Q8H  . DISCONTD: metolazone  5 mg Oral Daily  . DISCONTD: sotalol  40 mg Oral Once  . DISCONTD: torsemide  100 mg Oral BID   Continuous Infusions:    . heparin 1,100 Units/hr (09/03/12 1214)    Principal Problem:  *Multiple fractures of both lower extremities Active Problems:  DM  OBSTRUCTIVE SLEEP APNEA  GERD  PAF (paroxysmal atrial fibrillation), was in SR 06/2012   HTN (hypertension)  COPD (chronic obstructive pulmonary disease)  Hyponatremia  CKD (chronic kidney disease)  Pleural effusion  Hypercapnemia  Acute respiratory failure  Chronic diastolic heart failure  Hypokalemia

## 2012-09-03 NOTE — Progress Notes (Signed)
Patient ID: Gina Ashley, female   DOB: 12-27-1926, 78 y.o.   MRN: [id number] S:c/o being in excruciating pain today, but is awake/alert and resting in bed O:BP 129/70  Pulse 72  Temp 98.2 F (36.8 C) (Oral)  Resp 17  Ht 5\' 3"  (1.6 m)  Wt 112.9 kg (248 lb 14.4 oz)  BMI 44.09 kg/m2  SpO2 95%  Intake/Output Summary (Last 24 hours) at 09/03/12 1143 Last data filed at 09/03/12 1000  Gross per 24 hour  Intake     10 ml  Output   4110 ml  Net  -4100 ml   Intake/Output: I/O last 3 completed shifts: In: 70 [I.V.:10; Other:60] Out: 6305 [Urine:6305]  Intake/Output this shift:  Total I/O In: -  Out: 550 [Urine:550] Weight change: -5.5 kg (-12 lb 2 oz) Gen:WD obese AAF in NAD in bed CVS:no rub Resp:decreased BS at bases WUJ:[id number] Ext:2+edema   Lab 09/03/12 0455 09/02/12 0401 09/01/12 0342 08/31/12 0415 08/30/12 0510 08/29/12 0440 08/28/12 1135 08/27/12 2110  NA 135 132* 131* 135 136 136 135 --  K 3.5 3.2* 4.2 4.6 4.4 4.4 5.6* --  CL 86* 87* 88* 93* 93* 94* 94* --  CO2 45* 41* 35* 34* 37* 37* 36* --  GLUCOSE 179* 146* 242* 240* 217* 178* 209* --  BUN 89* 84* 83* 74* 61* 49* 51* --  CREATININE 1.91* 2.39* 2.77* 2.69* 2.43* 1.94* 2.21* --  ALBUMIN 2.7* 2.7* 2.7* 2.9* -- 3.1* -- --  CALCIUM 9.3 8.8 8.7 8.6 8.5 8.5 9.1 --  PHOS 3.0 4.3 5.6* 5.8* 5.2* -- -- 4.7*  AST -- -- -- -- -- 13 -- --  ALT -- -- -- -- -- 9 -- --   Liver Function Tests:  Lab 09/03/12 0455 09/02/12 0401 09/01/12 0342 08/29/12 0440  AST -- -- -- 13  ALT -- -- -- 9  ALKPHOS -- -- -- 91  BILITOT -- -- -- 0.7  PROT -- -- -- 6.0  ALBUMIN 2.7* 2.7* 2.7* --   No results found for this basename: LIPASE:3,AMYLASE:3 in the last 168 hours No results found for this basename: AMMONIA:3 in the last 168 hours CBC:  Lab 09/02/12 0401 08/31/12 0415 08/30/12 0510 08/29/12 0440 08/28/12 0545 08/27/12 1650  WBC 7.6 9.4 8.9 -- -- --  NEUTROABS -- -- -- -- -- 6.2  HGB 8.4* 8.5* 8.2* -- -- --  HCT 26.3* 27.7* 26.5* --  -- --  MCV 84.6 87.4 87.2 86.0 86.1 --  PLT 134* 141* 116* -- -- --   Cardiac Enzymes: No results found for this basename: CKTOTAL:5,CKMB:5,CKMBINDEX:5,TROPONINI:5 in the last 168 hours CBG:  Lab 09/03/12 0743 09/02/12 2251 09/02/12 1744 09/02/12 1242 09/02/12 0812  GLUCAP 167* 257* 241* 205* 133*    Iron Studies: No results found for this basename: IRON,TIBC,TRANSFERRIN,FERRITIN in the last 72 hours Studies/Results: No results found.    Marland Kitchen antiseptic oral rinse  15 mL Mouth Rinse q12n4p  . chlorhexidine  15 mL Mouth Rinse BID  . diltiazem  90 mg Oral QID  . diphenhydrAMINE  50 mg Oral Once  . docusate sodium  100 mg Oral BID  . feeding supplement  30 mL Oral TID WC  . fluticasone  1 puff Inhalation BID  . insulin aspart  0-15 Units Subcutaneous TID WC  . insulin glargine  10 Units Subcutaneous QHS  . isosorbide dinitrate  20 mg Oral TID  . levalbuterol  0.63 mg Nebulization BID  . pantoprazole  40 mg Oral Daily  .  polyethylene glycol  17 g Oral BID  . potassium chloride  30 mEq Oral Once  . potassium chloride  40 mEq Oral Q2H  . sodium chloride  10-40 mL Intracatheter Q12H  . sotalol  80 mg Oral BID  . torsemide  100 mg Oral Daily  . torsemide  50 mg Oral QPM  . DISCONTD: heparin subcutaneous  5,000 Units Subcutaneous Q8H  . DISCONTD: metolazone  5 mg Oral Daily  . DISCONTD: sotalol  40 mg Oral Once  . DISCONTD: torsemide  100 mg Oral BID    BMET    Component Value Date/Time   NA 135 09/03/2012 0455   K 3.5 09/03/2012 0455   CL 86* 09/03/2012 0455   CO2 45* 09/03/2012 0455   GLUCOSE 179* 09/03/2012 0455   BUN 89* 09/03/2012 0455   CREATININE 1.91* 09/03/2012 0455   CALCIUM 9.3 09/03/2012 0455   GFRNONAA 23* 09/03/2012 0455   GFRAA 27* 09/03/2012 0455   CBC    Component Value Date/Time   WBC 7.6 09/02/2012 0401   RBC 3.11* 09/02/2012 0401   HGB 8.4* 09/02/2012 0401   HCT 26.3* 09/02/2012 0401   PLT 134* 09/02/2012 0401   MCV 84.6 09/02/2012 0401   MCH  27.0 09/02/2012 0401   MCHC 31.9 09/02/2012 0401   RDW 16.3* 09/02/2012 0401   LYMPHSABS 0.8 08/27/2012 1650   MONOABS 0.6 08/27/2012 1650   EOSABS 0.2 08/27/2012 1650   BASOSABS 0.0 08/27/2012 1650   Assessment/Plan:  1. AKI/CKD- markedly improved volume status since switching to torsemide 100mg  po bid. -4 liters over the last 24 hours. Will decrease dose due to increased BUN.  Change dose to 100 qam and 50mg  in pm. Pt has advanced CKD stage IV at baseline, now with decompensated RHF, morbid obesity and bilateral leg fractures. Pt is a poor candidate for HD given advanced age, morbid obesity, multiple irreversible chronic medical problems, none of which would improve with RRT, nor her overal QOL. I discussed this with the patient and her daughter. All are in agreement not to attempt RRT. Will try to maximize volume status as well as pulm status and hopefully stabilize renal function prior to proceeding with surgery. Will discuss with Ortho. Need to avoid NSAIDs/COX-II Inhibitors/IV contrast material 2. Leg fractures- as above. Poor pain control at the present and she is very sensitive to narcotics.  Would recommend tramadol 25mg  bid for now and consider Palliative care or anesthesia consult to help with balancing her resp depression due to narcotics and adequate pain control.  I discussed case with Dr. Charlann Boxer on 08/31/12 and we agreed to hold off on surgery at this time given decompensated CHF/hypercapnea and AKI/CKD. Hopefully her overall condition will improve with volume removal.  3. Hypercapnic respiratory failure/Obesity-hypoventilation syndrome- improved. Cont with O2 via [place] per PCCM 4. Hypokalemia- replete po 5. CHF- mainly right sided. Markedly improved with torsemide. See above 6. ABLA/ACDz- follow H/H and transfuse prn. Consider EPO 7. A fib- rate-controlled 8. HTN- stable 9. Hyponatremia- due to CHF cont with diuresis 10. DM- poorly controlled, defer to primary svc 11. Dispo- poor QOL  with multiple comorbidities. Will likely return to SNF once stable vs LTCF if she improves. Agree with discussion and suggestions of Anders Simmonds, NP regarding limitations of medical therapy. Palliative care may be required if her clinical course was to worsen. Gina Ashley A

## 2012-09-03 NOTE — Progress Notes (Addendum)
ANTICOAGULATION CONSULT NOTE - Initial Consult  Pharmacy Consult for IV Heparin  Indication: A.Fib  Allergies  Allergen Reactions  . Morphine Sulfate Other (See Comments)    REACTION: change in personality  . Percocet (Oxycodone-Acetaminophen) Other (See Comments)    Does not relieve the pain  . Remeron (Mirtazapine) Other (See Comments)    Altered mental status, lethargy  . Tuna (Fish Allergy)   . Penicillins Rash    Patient Measurements: Height: 5\' 3"  (160 cm) Weight: 248 lb 14.4 oz (112.9 kg) IBW/kg (Calculated) : 52.4  TBW = 112.9 kg Heparin dosing weight = 80 kg  Vital Signs: Temp: 98.2 F (36.8 C) (10/31 0800) Temp src: Oral (10/31 0800) BP: 115/56 mmHg (10/31 0400) Pulse Rate: 56  (10/31 0500)  Labs:  Basename 09/03/12 0455 09/02/12 0401 09/01/12 0348 09/01/12 0342  HGB -- 8.4* -- --  HCT -- 26.3* -- --  PLT -- 134* -- --  APTT -- -- -- --  LABPROT 16.9* 19.2* 21.0* --  INR 1.41 1.68* 1.89* --  HEPARINUNFRC -- -- -- --  CREATININE 1.91* 2.39* -- 2.77*  CKTOTAL -- -- -- --  CKMB -- -- -- --  TROPONINI -- -- -- --    Estimated Creatinine Clearance: 27 ml/min (by C-G formula based on Cr of 1.91).   Medical History: Past Medical History  Diagnosis Date  . Diabetes mellitus     x 15 yrs  . GERD (gastroesophageal reflux disease)   . Back pain, chronic   . Pain in shoulder   . Pain in ankle   . Hiatal hernia   . HTN (hypertension)     all her life  . CKD (chronic kidney disease), stage III     GFR: 38  . Atrial fibrillation     since 1996  . Sleep apnea   . Fall 3 weeks ago    was [facility] at [facility]  . OSA on CPAP   . Depression   . PONV (postoperative nausea and vomiting)     difficult to wake up  . Chronic diastolic heart failure 08/31/2012    Medications:  Scheduled:    . antiseptic oral rinse  15 mL Mouth Rinse q12n4p  . chlorhexidine  15 mL Mouth Rinse BID  . diltiazem  90 mg Oral QID  . diphenhydrAMINE  50 mg Oral Once  .  docusate sodium  100 mg Oral BID  . feeding supplement  30 mL Oral TID WC  . fluticasone  1 puff Inhalation BID  . heparin subcutaneous  5,000 Units Subcutaneous Q8H  . insulin aspart  0-15 Units Subcutaneous TID WC  . insulin glargine  10 Units Subcutaneous QHS  . isosorbide dinitrate  20 mg Oral TID  . levalbuterol  0.63 mg Nebulization BID  . magnesium citrate  1 Bottle Oral Once  . metolazone  5 mg Oral Daily  . pantoprazole  40 mg Oral Daily  . polyethylene glycol  17 g Oral BID  . potassium chloride  30 mEq Oral Once  . potassium chloride  40 mEq Oral Q2H  . sodium chloride  10-40 mL Intracatheter Q12H  . sotalol  80 mg Oral BID  . torsemide  100 mg Oral BID  . DISCONTD: sotalol  40 mg Oral Once   Infusions:   PRN: bisacodyl, diphenhydrAMINE-zinc acetate, feeding supplement, HYDROcodone-acetaminophen, levalbuterol, metoprolol, ondansetron (ZOFRAN) IV, ondansetron, sodium chloride, sodium phosphate, DISCONTD: HYDROcodone-acetaminophen, DISCONTD: HYDROcodone-acetaminophen  Assessment:  78 YOF with a history of  A.fib on chronic coumadin.  Admitted on 10/24 with b/l LE fractures s/p fall. Surgical intervention tentatively planned for 11/4.  Plan is to hold coumadin and bridge with IV heparin prior to surgery  Chronic renal insufficiency noted  H/H 8.4/26.3, stable.  No current bleeding/issues  Will avoid bolus given age 78 years old, discussed with MD.  Patient also received Heparin 5000 units this morning at 0524  Patient is obese, will dose heparin based on Heparin Dosing Weight of 80 kg   Goal of Therapy:  Heparin level 0.3-0.7 units/ml Monitor platelets by anticoagulation protocol: Yes   Plan:  1.) Heparin 1100 units/hr (= 11 mlhr) at 1100.  Discontinue SQ heparin.  2.) Heparin level 8 hours after gtt starts around 1830 3.) Daily Heparin Level and CBC 4.) Will Discontinue Daily INR levels as INR is now < 1.5,  May consider ordering INR the morning of surgery    Mccall Lomax, Loma Messing PharmD Pager #: [phone] 10:12 AM 09/03/2012

## 2012-09-03 NOTE — Progress Notes (Signed)
Brief patient description:  This is a 78 year old female w/ multiple co-morbids including: CRF stage IV, AF on coumadin, OSA (wears CPAP at HS). Recently transferred to SNF after ~ 2 week stay at [facility] for acute on chronic renal failure. Had unwitnessed fall on 10/24 resulting in BLE fractures. Admitted by medical service rx has included pain management, IV lasix gtt for volume overload and ortho consult. Subjective: Still with significant pain, but alert and oriented  Objective: Vital signs in last 24 hours: Temp:  [98.2 F (36.8 C)-99.3 F (37.4 C)] 98.2 F (36.8 C) (10/31 0800) Pulse Rate:  [52-89] 72  (10/31 1100) Resp:  [8-21] 17  (10/31 1100) BP: (99-129)/(52-76) 129/70 mmHg (10/31 1025) SpO2:  [95 %-100 %] 95 % (10/31 1100) Weight:  [112.9 kg (248 lb 14.4 oz)] 112.9 kg (248 lb 14.4 oz) (10/31 0500) Weight change: -5.5 kg (-12 lb 2 oz) Last BM Date: 09/02/12 Intake/Output from previous day: -5150 10/30 0701 - 10/31 0700 In: 10 [I.V.:10] Out: 5160 [Urine:5160] Intake/Output this shift: Total I/O In: 11 [I.V.:11] Out: 1950 [Urine:1950]  PE: General:complains of leg pain Heart:S1S2 irreg irreg Lungs:decreased in the bases, no wheezes ZOX:WRUEA, soft, non tender, + BS Ext:+ edema of feet 2-3+ but improved Neuro:alert, oriented   Lab Results:  [facility] 09/02/12 0401  WBC 7.6  HGB 8.4*  HCT 26.3*  PLT 134*   BMET  Basename 09/03/12 0455 09/02/12 0401  NA 135 132*  K 3.5 3.2*  CL 86* 87*  CO2 45* 41*  GLUCOSE 179* 146*  BUN 89* 84*  CREATININE 1.91* 2.39*  CALCIUM 9.3 8.8   No results found for this basename: TROPONINI:2,CK,MB:2 in the last 72 hours  Lab Results  Component Value Date   CHOL 96 08/24/2012   HDL 43 08/24/2012   LDLCALC 38 08/24/2012   TRIG 74 08/24/2012   CHOLHDL 2.2 08/24/2012   Lab Results  Component Value Date   HGBA1C 6.7* 08/27/2012     Lab Results  Component Value Date   TSH 1.492 08/27/2012    Hepatic Function  Panel  Basename 09/03/12 0455  PROT --  ALBUMIN 2.7*  AST --  ALT --  ALKPHOS --  BILITOT --  BILIDIR --  IBILI --   No results found for this basename: CHOL in the last 72 hours No results found for this basename: PROTIME in the last 72 hours    EKG: Orders placed during the hospital encounter of 08/27/12  . EKG 12-LEAD  . EKG 12-LEAD  . EKG    Studies/Results: No results found.  Medications: I have reviewed the patient's current medications.    Marland Kitchen antiseptic oral rinse  15 mL Mouth Rinse q12n4p  . chlorhexidine  15 mL Mouth Rinse BID  . diltiazem  90 mg Oral QID  . diphenhydrAMINE  50 mg Oral Once  . docusate sodium  100 mg Oral BID  . feeding supplement  30 mL Oral TID WC  . fluticasone  1 puff Inhalation BID  . insulin aspart  0-15 Units Subcutaneous TID WC  . insulin glargine  10 Units Subcutaneous QHS  . isosorbide dinitrate  20 mg Oral TID  . levalbuterol  0.63 mg Nebulization BID  . pantoprazole  40 mg Oral Daily  . polyethylene glycol  17 g Oral BID  . potassium chloride  40 mEq Oral Q2H  . sodium chloride  10-40 mL Intracatheter Q12H  . sotalol  80 mg Oral BID  . torsemide  100 mg Oral Daily  . torsemide  50 mg Oral QPM  . DISCONTD: heparin subcutaneous  5,000 Units Subcutaneous Q8H  . DISCONTD: metolazone  5 mg Oral Daily  . DISCONTD: sotalol  40 mg Oral Once  . DISCONTD: torsemide  100 mg Oral BID   Assessment/Plan: Principal Problem:  *Multiple fractures of both lower extremities Active Problems:  DM  OBSTRUCTIVE SLEEP APNEA  GERD  PAF (paroxysmal atrial fibrillation), was in SR 06/2012   HTN (hypertension)  COPD (chronic obstructive pulmonary disease)  Hyponatremia  CKD (chronic kidney disease)  Pleural effusion  Hypercapnemia  Acute respiratory failure  Chronic diastolic heart failure  Hypokalemia  PLAN: surgery has been put on hold-plan for Nov 4. Renal failure is improving, Renal following, she is not HD candidate. Afib with rate  control-on sotalol & dilt 90 mg QID--at times HR to 56 CHF improved once on Torsemide  Wt down from 118.4 to 112.9 DM followed by CCM   LOS: 7 days   INGOLD,LAURA R 09/03/2012, 1:12 PM

## 2012-09-03 NOTE — Progress Notes (Signed)
Chaplain visited with patient and her family (3 daughters, granddaughters & great granddaughter.  Pt talked about her faith as sustaining and giving her strength as she is experiencing a lot of pain.  Chaplain provided emotional and spiritual support. Will follow up as needed.  Chaplain Rutherford Nail

## 2012-09-03 NOTE — Progress Notes (Signed)
PHARMACY BRIEF NOTE - DRUG LEVEL RESULT  Gina Ashley is receiving anticoagulation due to a history of atrial fibrillation.  An IV Heparin infusion was started today while the Coumadin is on hold for anticipated orthopedic surgery.  The current infusion rate is 1,100 units/hr.  The heparin level drawn at 18:56 today is reported as 0.41 units/ml.  This level is within the therapeutic range (0.3-0.7 units/ml).  Plan: - Continue heparin infusion at the current rate. - Follow the daily heparin level and CBC.  Polo Riley R.Ph. 09/03/2012 8:15 PM

## 2012-09-03 NOTE — Progress Notes (Signed)
Name: Gina Ashley MRN: [id number] DOB: [birth year]    LOS: 7  Referring Provider:  Rito Ehrlich Reason for Referral:  AMS  PULMONARY / CRITICAL CARE MEDICINE   Brief patient description:  This is a 78 year old female w/ multiple co-morbids including: CRF stage IV, AF on coumadin, OSA (wears CPAP at HS). Recently transferred to SNF after ~ 2 week stay at [facility] for acute on chronic renal failure. Had unwitnessed fall on 10/24 resulting in BLE fractures. Admitted by medical service rx has included pain management, IV lasix gtt for volume overload and ortho consult. PCCM asked to see on 10/28 for AMS and concern about hypercarbia.   Events Since Admission: More awake. No complaints.   Current Status: Guarded, but improved.  Vital Signs: Temp:  [98.2 F (36.8 C)-99.3 F (37.4 C)] 98.2 F (36.8 C) (10/31 0800) Pulse Rate:  [52-101] 56  (10/31 0500) Resp:  [8-15] 14  (10/31 0500) BP: (99-131)/(52-83) 115/56 mmHg (10/31 0400) SpO2:  [95 %-100 %] 96 % (10/31 0801) Weight:  [112.9 kg (248 lb 14.4 oz)] 112.9 kg (248 lb 14.4 oz) (10/31 0500) 2 liters n/c Physical Examination: General:  Chronically ill appearing 78 year old female. More awake.  Neuro:  Awake, oriented X 3. Denies pain.  HEENT:  + JVD, MM dry Cardiovascular:  Regular irregular  Lungs:  Decreased t/o Abdomen:  No OM, + bowel sounds  Musculoskeletal:  LE pain Skin:  + anasarca   Principal Problem:  *Multiple fractures of both lower extremities Active Problems:  DM  OBSTRUCTIVE SLEEP APNEA  GERD  PAF (paroxysmal atrial fibrillation), was in SR 06/2012   HTN (hypertension)  COPD (chronic obstructive pulmonary disease)  Hyponatremia  CKD (chronic kidney disease)  Pleural effusion  Hypercapnemia  Acute respiratory failure  Chronic diastolic heart failure  Hypokalemia   ASSESSMENT AND PLAN  PULMONARY  Lab 09/01/12 0524 08/31/12 0938 08/28/12 1615 08/28/12 0844  PHART 7.318* 7.312* 7.385 7.262*  PCO2ART 70.0*  69.6* 60.5* 79.8*  PO2ART 86.5 89.7 141.0* 77.1*  HCO3 34.9* 34.2* 35.4* 34.8*  O2SAT 97.5 97.5 99.8 95.3   Ventilator Settings:   CXR:  Bilateral airspace disease, R>L. C/W edema and low volume. Decreased aeration c/w exam on 10/29 ETT:    A:   Acute Hypercarbic Respiratory Failure: in setting of narcotic effect superimposed on underlying OSA and CRF Bilateral atelectasis Pulmonary edema/volume excess +/- element of effusion  Renal fxn seems to be improving  P:   Cont BIPAP PRN Try to find the smallest, most effective dose for analgesia Cont current diuresis  F/u cxr   CARDIOVASCULAR No results found for this basename: TROPONINI:5,LATICACIDVEN:5, O2SATVEN:5,PROBNP:5 in the last 168 hours ECG:  afib Lines: ECHO 10/21: estimated ejection fraction was in the range of 55% to 60%. Wall motion was normal; there were no regional wall motion abnormalities. The study is not technically sufficient to allow evaluation of LV diastolic function. A:  AFIB HTN Pulmonary edema  Both fib and HTN controlled. Think that edema more a component of renal failure, and perhaps element of diastolic dysfunction.  P:  Cont tele monitoring Cont diuresis  BIPAP should also help w/ edema  Holding coumadin -->IV heparin   RENAL  Lab 09/03/12 0455 09/02/12 0401 09/01/12 0342 08/31/12 0415 08/30/12 0510 08/27/12 2110  NA 135 132* 131* 135 136 --  K 3.5 3.2* -- -- -- --  CL 86* 87* 88* 93* 93* --  CO2 45* 41* 35* 34* 37* --  BUN 89*  84* 83* 74* 61* --  CREATININE 1.91* 2.39* 2.77* 2.69* 2.43* --  CALCIUM 9.3 8.8 8.7 8.6 8.5 --  MG -- -- -- -- -- 2.3  PHOS 3.0 4.3 5.6* 5.8* 5.2* --   Intake/Output      10/30 0701 - 10/31 0700 10/31 0701 - 11/01 0700   I.V. (mL/kg) 10 (0.1)    Other     Total Intake(mL/kg) 10 (0.1)    Urine (mL/kg/hr) 5160 (1.9)    Total Output 5160    Net -5150         Stool Occurrence 1 x     Foley:  10/24 Renal US 10/27: right kidney WNL, left not well assessed.   A:    Acute on Chronic renal failure.  Hypokalemia  Followed by nephrology, she is lasix resistant. Changed to demadex w/ what appears to be a favorable response.  P:   Replaced K Avoid nephrotoxins.  Renal adjust meds F/u chemistry q 12  GASTROINTESTINAL  Lab 09/03/12 0455 09/02/12 0401 09/01/12 0342 08/31/12 0415 08/29/12 0440  AST -- -- -- -- 13  ALT -- -- -- -- 9  ALKPHOS -- -- -- -- 91  BILITOT -- -- -- -- 0.7  PROT -- -- -- -- 6.0  ALBUMIN 2.7* 2.7* 2.7* 2.9* 3.1*    A:   Obesity Malnutrition  Has been acutely on chronically ill for several weeks now.  P:   Advance diet as tolerated Recommenced Ensure Complete to be added   HEMATOLOGIC  Lab 09/03/12 0455 09/02/12 0401 09/01/12 0348 08/31/12 0415 08/30/12 0510 08/29/12 0440 08/28/12 0545  HGB -- 8.4* -- 8.5* 8.2* 8.4* 7.5*  HCT -- 26.3* -- 27.7* 26.5* 27.0* 24.8*  PLT -- 134* -- 141* 116* 100* 121*  INR 1.41 1.68* 1.89* 1.88* 2.35* -- --  APTT -- -- -- -- -- -- --   A:   Anemia of Chronic disease.  Coumadin induced coagulopathy  Got 1 unit PRBC on 10/25. No evidence of active bleeding. Will add IV heparin for her AF P:  Trend CBC Trigger to transfuse < 7 PPI Hold coumadin, add IV heparin   INFECTIOUS  Lab 09/02/12 0401 09/01/12 0348 08/31/12 0415 08/30/12 0510 08/29/12 0440 08/28/12 0545  WBC 7.6 -- 9.4 8.9 8.2 8.6  PROCALCITON <0.10 0.12 <0.10 -- -- --   Cultures: Antibiotics:   A:  No current evidence of infection.  P:   Supportive care Trend WBC  ENDOCRINE  Lab 09/03/12 0743 09/02/12 2251 09/02/12 1744 09/02/12 1242 09/02/12 0812  GLUCAP 167* 257* 241* 205* 133*   A:   DM w/ hyperglycemia P:   Cont SSI Needs adjustment in basal insulin  NEUROLOGIC  A:  Acute Encephalopathy (resolved) Most likely this is a mix of hypercarbia and Narcotics.  This is much better.  P:   Supportive care Minimal narcotics  D/c gabapentin   Goals of care Spoke at length with the pt and two of her  daughters. See note on 10/29, full DNR Not candidate for HD  BEST PRACTICE / DISPOSITION Level of Care:  SDU Primary Service:  triad Consultants:  PCCM Code Status:  Full-->DNR Diet:   DVT Px:  Was on coumadin, Will start heparin when INR < 1.5 GI Px:  PPI Skin Integrity:  intact Social / Family:  Updated.   BABCOCK,PETE,  09/03/2012, 9:27 AM     Attending:  I have seen and examined the patient with nurse practitioner/resident and agree with the note  above.   Renal function continues to improve.  Edema improved.  Doing great with qHS BIPAP.  Plan at this point is to continue with surgery; she and her daughters understand that there is a risk of prolonged mechanical ventilation but they are willing to proceed.  I have decreased the torsemide per Renal recs and d/c'd metolazone.  Yolonda Kida PCCM Pager: [phone] Cell: [phone] If no response, call [phone]

## 2012-09-04 DIAGNOSIS — R52 Pain, unspecified: Secondary | ICD-10-CM

## 2012-09-04 DIAGNOSIS — E873 Alkalosis: Secondary | ICD-10-CM | POA: Diagnosis not present

## 2012-09-04 LAB — URINE MICROSCOPIC-ADD ON

## 2012-09-04 LAB — URINALYSIS, ROUTINE W REFLEX MICROSCOPIC
Bilirubin Urine: NEGATIVE
Glucose, UA: NEGATIVE mg/dL
Ketones, ur: NEGATIVE mg/dL
Nitrite: NEGATIVE
Protein, ur: NEGATIVE mg/dL
Specific Gravity, Urine: 1.01 (ref 1.005–1.030)
Urobilinogen, UA: 1 mg/dL (ref 0.0–1.0)
pH: 7.5 (ref 5.0–8.0)

## 2012-09-04 LAB — GLUCOSE, CAPILLARY
Glucose-Capillary: 204 mg/dL — ABNORMAL HIGH (ref 70–99)
Glucose-Capillary: 202 mg/dL — ABNORMAL HIGH (ref 70–99)

## 2012-09-04 LAB — RENAL FUNCTION PANEL
Albumin: 2.9 g/dL — ABNORMAL LOW (ref 3.5–5.2)
BUN: 86 mg/dL — ABNORMAL HIGH (ref 6–23)
Creatinine, Ser: 1.86 mg/dL — ABNORMAL HIGH (ref 0.50–1.10)
GFR calc non Af Amer: 24 mL/min — ABNORMAL LOW (ref >90)
Phosphorus: 3.6 mg/dL (ref 2.3–4.6)
Potassium: 3.4 mEq/L — ABNORMAL LOW (ref 3.5–5.1)

## 2012-09-04 LAB — CBC
MCH: 26.7 pg (ref 26.0–34.0)
MCV: 87.6 fL (ref 78.0–100.0)
Platelets: 171 10*3/uL (ref 150–400)
RDW: 16.7 % — ABNORMAL HIGH (ref 11.5–15.5)
WBC: 6.8 10*3/uL (ref 4.0–10.5)

## 2012-09-04 MED ORDER — DARBEPOETIN ALFA-POLYSORBATE 100 MCG/0.5ML IJ SOLN
100.0000 ug | INTRAMUSCULAR | Status: DC
  Administered 2012-09-04 – 2012-09-11 (×2): 100 ug via SUBCUTANEOUS
  Filled 2012-09-04 (×3): qty 0.5

## 2012-09-04 MED ORDER — INSULIN GLARGINE 100 UNIT/ML ~~LOC~~ SOLN
15.0000 [IU] | Freq: Every day | SUBCUTANEOUS | Status: DC
  Administered 2012-09-04 – 2012-09-07 (×4): 15 [IU] via SUBCUTANEOUS

## 2012-09-04 MED ORDER — INSULIN ASPART 100 UNIT/ML ~~LOC~~ SOLN
0.0000 [IU] | Freq: Three times a day (TID) | SUBCUTANEOUS | Status: DC
  Administered 2012-09-04 – 2012-09-05 (×2): 5 [IU] via SUBCUTANEOUS
  Administered 2012-09-05 (×2): 3 [IU] via SUBCUTANEOUS
  Administered 2012-09-06: 8 [IU] via SUBCUTANEOUS
  Administered 2012-09-06 – 2012-09-07 (×3): 3 [IU] via SUBCUTANEOUS
  Administered 2012-09-08 (×3): 2 [IU] via SUBCUTANEOUS

## 2012-09-04 MED ORDER — POTASSIUM CHLORIDE CRYS ER 20 MEQ PO TBCR
40.0000 meq | EXTENDED_RELEASE_TABLET | Freq: Every day | ORAL | Status: AC
  Administered 2012-09-05 – 2012-09-06 (×2): 40 meq via ORAL
  Filled 2012-09-04 (×3): qty 2

## 2012-09-04 MED ORDER — TORSEMIDE 10 MG PO TABS
50.0000 mg | ORAL_TABLET | Freq: Every day | ORAL | Status: DC
  Filled 2012-09-04: qty 1

## 2012-09-04 MED ORDER — OXYCODONE HCL 5 MG PO TABS
5.0000 mg | ORAL_TABLET | ORAL | Status: DC | PRN
  Administered 2012-09-04 – 2012-09-06 (×5): 5 mg via ORAL
  Filled 2012-09-04 (×5): qty 1

## 2012-09-04 MED ORDER — SOTALOL HCL 80 MG PO TABS
80.0000 mg | ORAL_TABLET | Freq: Two times a day (BID) | ORAL | Status: DC
  Administered 2012-09-04 – 2012-09-05 (×3): 80 mg via ORAL
  Filled 2012-09-04 (×6): qty 1

## 2012-09-04 MED ORDER — INSULIN ASPART 100 UNIT/ML ~~LOC~~ SOLN
0.0000 [IU] | Freq: Every day | SUBCUTANEOUS | Status: DC
  Administered 2012-09-06 – 2012-09-07 (×2): 2 [IU] via SUBCUTANEOUS

## 2012-09-04 MED ORDER — POTASSIUM CHLORIDE CRYS ER 20 MEQ PO TBCR
40.0000 meq | EXTENDED_RELEASE_TABLET | ORAL | Status: AC
  Administered 2012-09-04 (×2): 40 meq via ORAL
  Filled 2012-09-04: qty 2

## 2012-09-04 MED ORDER — ACETAZOLAMIDE ER 500 MG PO CP12
500.0000 mg | ORAL_CAPSULE | Freq: Two times a day (BID) | ORAL | Status: DC
  Administered 2012-09-04 – 2012-09-06 (×4): 500 mg via ORAL
  Filled 2012-09-04 (×6): qty 1

## 2012-09-04 NOTE — Progress Notes (Signed)
Spoke with pt daughter, Rushie Goltz. Faith confirmed that pt DOES NOT have allergy to oxycodone.  Langley Gauss, RN

## 2012-09-04 NOTE — Progress Notes (Signed)
Placed patient on BiPAP 14/7 and 40%. Patient tolerated for about 5 minutes and then took the mask off herself. RT attempted to adjust mask and BiPAP pressure to make it more tolerable for her but she couldn't tolerate it. RN is aware, RT asked to see if she could possibly get an order for xanax or ativan to help patient relax and possibly try the BiPAP again.

## 2012-09-04 NOTE — Progress Notes (Addendum)
Pt's underlying rhythm is a-fib; having some pauses; NP on call notified. Informed of HR, BP, rhythm. Will continue to monitor and will pass on to day shift RN to alert Cardiology.

## 2012-09-04 NOTE — Progress Notes (Signed)
Patient ID: Gina Ashley, female   DOB: 12/27/1926, 78 y.o.   MRN: [id number]  Patient seen and current medical condition reviewed in chart Family in room, excellent support group  Still in pain and wishing despite expressed risks to proceed with surgery, does not want to live in current state of fractures  Right leg remains externally rotated through the fracture Obese  Plan for ORIF right distal femur periprosthetic femur fracture and left proximal periprosthetic tibia fracture Monday the 4th NPO after midnight Sunday the 3rd Patient and her family aware of risks but again wish to proceed due to her current state

## 2012-09-04 NOTE — Progress Notes (Signed)
Name: Gina Ashley MRN: [id number] DOB: 12-13-1926    LOS: 8  Referring Provider:  Rito Ehrlich Reason for Referral:  AMS  PULMONARY / CRITICAL CARE MEDICINE   Brief patient description:  This is a 78 year old female w/ multiple co-morbids including: CRF stage IV, AF on coumadin, OSA (wears CPAP at HS). Recently transferred to SNF after ~ 2 week stay at [facility] for acute on chronic renal failure. Had unwitnessed fall on 10/24 resulting in BLE fractures. Admitted by medical service rx has included pain management, IV lasix gtt for volume overload and ortho consult. PCCM asked to see on 10/28 for AMS and concern about hypercarbia.   Events Since Admission: More awake. No complaints. Awaiting Surgery Nov 4th ORIF  Current Status: Guarded, but improved.  Vital Signs: Temp:  [97.3 F (36.3 C)-98.4 F (36.9 C)] 97.6 F (36.4 C) (11/01 0800) Pulse Rate:  [52-89] 65  (11/01 0800) Resp:  [11-21] 21  (11/01 0800) BP: (116-145)/(51-88) 145/76 mmHg (11/01 0800) SpO2:  [93 %-100 %] 100 % (11/01 0800) Weight:  [110 kg (242 lb 8.1 oz)] 110 kg (242 lb 8.1 oz) (11/01 0500) 2 liters n/c Physical Examination: General:  Chronically ill appearing 78 year old female. More awake.  Neuro:  Awake, oriented X 3. Reports intermittent pain but No pain now. HEENT:  + JVD, MM dry Cardiovascular:  Regular irregular  Lungs:  Decreased bases.  Clear upper lobes Abdomen:  + bowel sounds, soft nontender Musculoskeletal:  Lower extremity pain bilatterally pain Skin:  Slight edema lower extremities  Principal Problem:  *Multiple fractures of both lower extremities Active Problems:  DM  OBSTRUCTIVE SLEEP APNEA  GERD  PAF (paroxysmal atrial fibrillation), was in SR 06/2012   HTN (hypertension)  COPD (chronic obstructive pulmonary disease)  Hyponatremia  CKD (chronic kidney disease)  Pleural effusion  Hypercapnemia  Acute respiratory failure  Chronic diastolic heart failure  Hypokalemia   ASSESSMENT AND  PLAN  PULMONARY  Lab 09/01/12 0524 08/31/12 0938 08/28/12 1615  PHART 7.318* 7.312* 7.385  PCO2ART 70.0* 69.6* 60.5*  PO2ART 86.5 89.7 141.0*  HCO3 34.9* 34.2* 35.4*  O2SAT 97.5 97.5 99.8   Ventilator Settings:   CXR:  Bilateral airspace disease, R>L. C/W edema and low volume. Decreased aeration c/w exam on 10/29 ETT:    A:   Acute Hypercarbic Respiratory Failure: in setting of narcotic effect superimposed on underlying OSA and CRF; needs long term BIPAP, not CPAP Bilateral atelectasis Pulmonary edema/volume excess +/- element of effusion  Renal fxn continues to improve P:   Cont BIPAP qHS Minimize narcotics Hold Torsemide for today and start it at 50 mg daily tomorrow   CARDIOVASCULAR No results found for this basename: TROPONINI:5,LATICACIDVEN:5, O2SATVEN:5,PROBNP:5 in the last 168 hours ECG:  aflutter Lines: ECHO 10/21: estimated ejection fraction was in the range of 55% to 60%. Wall motion was normal; there were no regional wall motion abnormalities. The study is not technically sufficient to allow evaluation of LV diastolic function. A:  AFLUTTER HTN Pulmonary edema  Both fib and HTN controlled. Think that edema more a component of renal failure, and perhaps element of diastolic dysfunction.  P:  Cont tele monitoring Hold diuresis today Holding coumadin -->IV heparin   RENAL  Lab 09/04/12 0454 09/03/12 0455 09/02/12 0401 09/01/12 0342 08/31/12 0415  NA 137 135 132* 131* 135  K 3.4* 3.5 -- -- --  CL 84* 86* 87* 88* 93*  CO2 >45* 45* 41* 35* 34*  BUN 86* 89* 84* 83*  74*  CREATININE 1.86* 1.91* 2.39* 2.77* 2.69*  CALCIUM 9.7 9.3 8.8 8.7 8.6  MG -- -- -- -- --  PHOS 3.6 3.0 4.3 5.6* 5.8*   Intake/Output      10/31 0701 - 11/01 0700 11/01 0701 - 11/02 0700   I.V. (mL/kg) 339 (3.1)    Total Intake(mL/kg) 339 (3.1)    Urine (mL/kg/hr) 5100 (1.9)    Total Output 5100    Net -4761          Foley:  10/24 Renal US 10/27: right kidney WNL, left not well  assessed.   A:   Acute on Chronic renal failure.  Hypokalemia  Followed by nephrology, she is lasix resistant. Changed to demadex w/ what appears to be a favorable response.  P:   Replaced K Avoid nephrotoxins.  Renal adjust meds Hold diuretics today, restart 11/2 Scheduled KCL with diuretics  GASTROINTESTINAL  Lab 09/04/12 0454 09/03/12 0455 09/02/12 0401 09/01/12 0342 08/31/12 0415 08/29/12 0440  AST -- -- -- -- -- 13  ALT -- -- -- -- -- 9  ALKPHOS -- -- -- -- -- 91  BILITOT -- -- -- -- -- 0.7  PROT -- -- -- -- -- 6.0  ALBUMIN 2.9* 2.7* 2.7* 2.7* 2.9* --    A:   Obesity Malnutrition  Has been acutely on chronically ill for several weeks now.   P:   Pt eating a small amount of Carb controlled diet. Recommenced Ensure Complete to be added   HEMATOLOGIC  Lab 09/04/12 0454 09/03/12 0455 09/02/12 0401 09/01/12 0348 08/31/12 0415 08/30/12 0510 08/29/12 0440  HGB 8.6* -- 8.4* -- 8.5* 8.2* 8.4*  HCT 28.2* -- 26.3* -- 27.7* 26.5* 27.0*  PLT 171 -- 134* -- 141* 116* 100*  INR -- 1.41 1.68* 1.89* 1.88* 2.35* --  APTT -- -- -- -- -- -- --   A:   Anemia of Chronic disease.  Coumadin induced coagulopathy  Got 1 unit PRBC on 10/25. No evidence of active bleeding. Will add IV heparin for her AF P:  Trend CBC Trigger to transfuse < 7 PPI Hold coumadin, add IV heparin   INFECTIOUS  Lab 09/04/12 0454 09/02/12 0401 09/01/12 0348 08/31/12 0415 08/30/12 0510 08/29/12 0440  WBC 6.8 7.6 -- 9.4 8.9 8.2  PROCALCITON -- <0.10 0.12 <0.10 -- --   Cultures: Antibiotics:   A:  No current evidence of infection.  P:   Supportive care Trend WBC  ENDOCRINE  Lab 09/04/12 0758 09/03/12 2226 09/03/12 2033 09/03/12 1658 09/03/12 1147  GLUCAP 198* 257* 225* 243* 254*   A:   DM w/ hyperglycemia P:   Cont SSI Needs adjustment in basal insulin  NEUROLOGIC  A:  Acute Encephalopathy (resolved) Most likely this is a mix of hypercarbia and Narcotics.  This is much better.  P:     Supportive care Minimal narcotics  reintroduce gabapentin or lyrica  Goals of care Spoke at length with the pt and two of her daughters. See note on 10/29, full DNR Not candidate for HD  BEST PRACTICE / DISPOSITION Level of Care:  SDU Primary Service:  triad Consultants:  PCCM Code Status:  Full-->DNR Diet:   DVT Px:  Was on coumadin, Will start heparin when INR < 1.5 GI Px:  PPI Skin Integrity:  intact Social / Family:  Updated.   We will see as needed over the weekend, call if question.  Gina Ashley PCCM Pager: [phone] Cell: [phone] If no response, call [phone]

## 2012-09-04 NOTE — Progress Notes (Addendum)
ANTICOAGULATION CONSULT NOTE - follow up Consult  Pharmacy Consult for IV Heparin  Indication: A.Fib  Allergies  Allergen Reactions  . Morphine Sulfate Other (See Comments)    REACTION: change in personality  . Percocet (Oxycodone-Acetaminophen) Other (See Comments)    Does not relieve the pain  . Remeron (Mirtazapine) Other (See Comments)    Altered mental status, lethargy  . Tuna (Fish Allergy)   . Penicillins Rash    Patient Measurements: Height: 5\' 3"  (160 cm) Weight: 242 lb 8.1 oz (110 kg) IBW/kg (Calculated) : 52.4  TBW = 112.9 kg Heparin dosing weight = 80 kg  Vital Signs: Temp: 97.3 F (36.3 C) (11/01 0400) Temp src: Oral (11/01 0400) BP: 116/59 mmHg (11/01 0400) Pulse Rate: 52  (11/01 0400)  Labs:  Basename 09/04/12 0525 09/04/12 0454 09/03/12 1856 09/03/12 0455 09/02/12 0401  HGB -- 8.6* -- -- 8.4*  HCT -- 28.2* -- -- 26.3*  PLT -- 171 -- -- 134*  APTT -- -- -- -- --  LABPROT -- -- -- 16.9* 19.2*  INR -- -- -- 1.41 1.68*  HEPARINUNFRC 0.43 -- 0.41 -- --  CREATININE -- 1.86* -- 1.91* 2.39*  CKTOTAL -- -- -- -- --  CKMB -- -- -- -- --  TROPONINI -- -- -- -- --    Estimated Creatinine Clearance: 27.3 ml/min (by C-G formula based on Cr of 1.86).   Medical History: Past Medical History  Diagnosis Date  . Diabetes mellitus     x 15 yrs  . GERD (gastroesophageal reflux disease)   . Back pain, chronic   . Pain in shoulder   . Pain in ankle   . Hiatal hernia   . HTN (hypertension)     all her life  . CKD (chronic kidney disease), stage III     GFR: 38  . Atrial fibrillation     since 1996  . Sleep apnea   . Fall 3 weeks ago    was evaluated at [facility]  . OSA on CPAP   . Depression   . PONV (postoperative nausea and vomiting)     difficult to wake up  . Chronic diastolic heart failure 08/31/2012    Medications:  Scheduled:     . antiseptic oral rinse  15 mL Mouth Rinse q12n4p  . chlorhexidine  15 mL Mouth Rinse BID  . diltiazem  90  mg Oral QID  . docusate sodium  100 mg Oral BID  . feeding supplement  30 mL Oral TID WC  . fluticasone  1 puff Inhalation BID  . insulin aspart  0-15 Units Subcutaneous TID WC  . insulin glargine  10 Units Subcutaneous QHS  . isosorbide dinitrate  20 mg Oral TID  . levalbuterol  0.63 mg Nebulization BID  . LORazepam  0.5 mg Oral Once  . pantoprazole  40 mg Oral Daily  . polyethylene glycol  17 g Oral BID  . sodium chloride  10-40 mL Intracatheter Q12H  . sotalol  80 mg Oral BID  . torsemide  100 mg Oral Daily  . torsemide  50 mg Oral QPM  . DISCONTD: heparin subcutaneous  5,000 Units Subcutaneous Q8H  . DISCONTD: metolazone  5 mg Oral Daily  . DISCONTD: sotalol  80 mg Oral BID  . DISCONTD: torsemide  100 mg Oral BID   Infusions:     . heparin 1,100 Units/hr (09/03/12 1214)   PRN: bisacodyl, diphenhydrAMINE-zinc acetate, feeding supplement, HYDROcodone-acetaminophen, levalbuterol, metoprolol, ondansetron (ZOFRAN) IV, ondansetron,  sodium chloride, sodium phosphate, traMADol, DISCONTD: HYDROcodone-acetaminophen, DISCONTD: traMADol  Assessment:  31 YOF with a history of A.fib on chronic coumadin.  Admitted on 10/24 with b/l LE fractures s/p fall. Surgical intervention tentatively planned for 11/4.  Plan is to hold coumadin and bridge with IV heparin prior to surgery  Chronic renal insufficiency noted, Scr improving  H/H low but stable.  No bleeding reported  HL 0.43 on 1100 units/hour - within goal range   Goal of Therapy:  Heparin level 0.3-0.7 units/ml Monitor platelets by anticoagulation protocol: Yes   Plan:  No Change to heparin rate Daily HL and CBC  Gwen Her PharmD  [phone] 09/04/2012 7:55 AM

## 2012-09-04 NOTE — Progress Notes (Signed)
[facility]  DAILY PROGRESS NOTE   Subjective:  Considerable pain today. Plan for OR on Monday.  Objective:  Temp:  [97.3 F (36.3 C)-98.2 F (36.8 C)] 97.6 F (36.4 C) (11/01 0800) Pulse Rate:  [52-74] 74  (11/01 1200) Resp:  [8-21] 8  (11/01 1200) BP: (116-145)/(51-88) 133/63 mmHg (11/01 1200) SpO2:  [93 %-100 %] 95 % (11/01 1200) Weight:  [110 kg (242 lb 8.1 oz)] 110 kg (242 lb 8.1 oz) (11/01 0500) Weight change: -2.9 kg (-6 lb 6.3 oz)  Intake/Output from previous day: 10/31 0701 - 11/01 0700 In: 339 [I.V.:339] Out: 5100 [Urine:5100]  Intake/Output from this shift: Total I/O In: 84 [I.V.:84] Out: 820 [Urine:820]  Medications: Current Facility-Administered Medications  Medication Dose Route Frequency Provider Last Rate Last Dose  . acetaZOLAMIDE (DIAMOX) 12 hr capsule 500 mg  500 mg Oral Q12H Irena Cords, MD   500 mg at 09/04/12 1000  . antiseptic oral rinse (BIOTENE) solution 15 mL  15 mL Mouth Rinse q12n4p Osvaldo Shipper, MD   15 mL at 09/04/12 1200  . bisacodyl (DULCOLAX) suppository 10 mg  10 mg Rectal Daily PRN Lupita Leash, MD      . chlorhexidine (PERIDEX) 0.12 % solution 15 mL  15 mL Mouth Rinse BID Osvaldo Shipper, MD   15 mL at 09/04/12 0845  . darbepoetin (ARANESP) injection 100 mcg  100 mcg Subcutaneous Q Fri-1800 Irena Cords, MD      . diltiazem (CARDIZEM) tablet 90 mg  90 mg Oral QID Rolan Lipa, NP   90 mg at 09/04/12 1306  . diphenhydrAMINE-zinc acetate (BENADRYL) 2-0.1 % cream   Topical TID PRN Altha Harm, MD      . docusate sodium (COLACE) capsule 100 mg  100 mg Oral BID Osvaldo Shipper, MD   100 mg at 09/04/12 0956  . feeding supplement (ENSURE COMPLETE) liquid 237 mL  237 mL Oral BID PRN Jeoffrey Massed, RD      . feeding supplement (PRO-STAT SUGAR FREE 64) liquid 30 mL  30 mL Oral TID WC Jeoffrey Massed, RD   30 mL at 09/04/12 1307  . fluticasone (FLOVENT HFA) 110 MCG/ACT inhaler 1  puff  1 puff Inhalation BID Osvaldo Shipper, MD   1 puff at 09/04/12 0754  . heparin ADULT infusion 100 units/mL (25000 units/250 mL)  1,100 Units/hr Intravenous Continuous Loma Messing [place], PHARMD 11 mL/hr at 09/04/12 1324 1,100 Units/hr at 09/04/12 1324  . HYDROcodone-acetaminophen (NORCO/VICODIN) 5-325 MG per tablet 2 tablet  2 tablet Oral Q4H PRN Simonne Martinet, NP   2 tablet at 09/04/12 1306  . insulin aspart (novoLOG) injection 0-15 Units  0-15 Units Subcutaneous TID WC Penny Pia, MD   5 Units at 09/04/12 1307  . insulin glargine (LANTUS) injection 10 Units  10 Units Subcutaneous QHS Osvaldo Shipper, MD   10 Units at 09/03/12 2234  . isosorbide dinitrate (ISORDIL) tablet 20 mg  20 mg Oral TID Penny Pia, MD   20 mg at 09/04/12 0956  . levalbuterol (XOPENEX) nebulizer solution 0.63 mg  0.63 mg Nebulization Q6H PRN Penny Pia, MD      . levalbuterol Pauline Aus) nebulizer solution 0.63 mg  0.63 mg Nebulization BID Osvaldo Shipper, MD   0.63 mg at 09/04/12 0754  . LORazepam (ATIVAN) tablet 0.5 mg  0.5 mg Oral Once Rolan Lipa, NP   0.5 mg at 09/04/12 0001  . metoprolol (LOPRESSOR) injection 2.5 mg  2.5 mg Intravenous Q12H PRN Rolan Lipa, NP   2.5 mg at 08/29/12 0135  . ondansetron (ZOFRAN) tablet 4 mg  4 mg Oral Q6H PRN Penny Pia, MD   4 mg at 09/04/12 1034   Or  . ondansetron (ZOFRAN) injection 4 mg  4 mg Intravenous Q6H PRN Penny Pia, MD   4 mg at 09/03/12 1938  . pantoprazole (PROTONIX) EC tablet 40 mg  40 mg Oral Daily Rollene Fare, PHARMD   40 mg at 09/04/12 0956  . polyethylene glycol (MIRALAX / GLYCOLAX) packet 17 g  17 g Oral BID Osvaldo Shipper, MD   17 g at 09/04/12 0956  . potassium chloride SA (K-DUR,KLOR-CON) CR tablet 40 mEq  40 mEq Oral Q3H Altha Harm, MD   40 mEq at 09/04/12 1306  . potassium chloride SA (K-DUR,KLOR-CON) CR tablet 40 mEq  40 mEq Oral Daily Lupita Leash, MD      . sodium chloride 0.9 % injection 10-40 mL   10-40 mL Intracatheter Q12H Osvaldo Shipper, MD   10 mL at 09/04/12 0957  . sodium chloride 0.9 % injection 10-40 mL  10-40 mL Intracatheter PRN Osvaldo Shipper, MD      . sodium phosphate (FLEET) 7-19 GM/118ML enema 1 enema  1 enema Rectal Daily PRN Osvaldo Shipper, MD   1 enema at 09/01/12 1823  . sotalol (BETAPACE) tablet 80 mg  80 mg Oral BID Rolan Lipa, NP   80 mg at 09/04/12 0955  . torsemide (DEMADEX) tablet 50 mg  50 mg Oral Daily Richard C Raynor, Student-NP      . traMADol (ULTRAM) tablet 25 mg  25 mg Oral Q6H PRN Altha Harm, MD   25 mg at 09/04/12 1108  . DISCONTD: sotalol (BETAPACE) tablet 80 mg  80 mg Oral BID Penny Pia, MD   80 mg at 09/03/12 2221  . DISCONTD: torsemide (DEMADEX) tablet 100 mg  100 mg Oral Daily Lupita Leash, MD      . DISCONTD: torsemide (DEMADEX) tablet 50 mg  50 mg Oral QPM Lupita Leash, MD   50 mg at 09/03/12 1708    Physical Exam: General appearance: alert and mild distress Neck: no adenopathy, no carotid bruit, no JVD, supple, symmetrical, trachea midline and thyroid not enlarged, symmetric, no tenderness/mass/nodules Lungs: clear to auscultation bilaterally Heart: irregularly irregular rhythm Abdomen: soft, non-tender; bowel sounds normal; no masses,  no organomegaly and morbidly obese Extremities: edema 2-3+ Pulses: 1+ Skin: pale, warm Neurologic: Grossly normal  Lab Results: Results for orders placed during the hospital encounter of 08/27/12 (from the past 48 hour(s))  GLUCOSE, CAPILLARY     Status: Abnormal   Collection Time   09/02/12  5:44 PM      Component Value Range Comment   Glucose-Capillary 241 (*) 70 - 99 mg/dL   GLUCOSE, CAPILLARY     Status: Abnormal   Collection Time   09/02/12 10:51 PM      Component Value Range Comment   Glucose-Capillary 257 (*) 70 - 99 mg/dL   PROTIME-INR     Status: Abnormal   Collection Time   09/03/12  4:55 AM      Component Value Range Comment   Prothrombin Time 16.9 (*)  11.6 - 15.2 seconds    INR 1.41  0.00 - 1.49   RENAL FUNCTION PANEL     Status: Abnormal   Collection Time   09/03/12  4:55 AM  Component Value Range Comment   Sodium 135  135 - 145 mEq/L    Potassium 3.5  3.5 - 5.1 mEq/L    Chloride 86 (*) 96 - 112 mEq/L    CO2 45 (*) 19 - 32 mEq/L    Glucose, Bld 179 (*) 70 - 99 mg/dL    BUN 89 (*) 6 - 23 mg/dL    Creatinine, Ser 1.61 (*) 0.50 - 1.10 mg/dL    Calcium 9.3  8.4 - 09.6 mg/dL    Phosphorus 3.0  2.3 - 4.6 mg/dL    Albumin 2.7 (*) 3.5 - 5.2 g/dL    GFR calc non Af Amer 23 (*) >90 mL/min    GFR calc Af Amer 27 (*) >90 mL/min   GLUCOSE, CAPILLARY     Status: Abnormal   Collection Time   09/03/12  7:43 AM      Component Value Range Comment   Glucose-Capillary 167 (*) 70 - 99 mg/dL    Comment 1 Documented in Chart      Comment 2 Notify RN     GLUCOSE, CAPILLARY     Status: Abnormal   Collection Time   09/03/12 11:47 AM      Component Value Range Comment   Glucose-Capillary 254 (*) 70 - 99 mg/dL    Comment 1 Documented in Chart      Comment 2 Notify RN     GLUCOSE, CAPILLARY     Status: Abnormal   Collection Time   09/03/12  4:58 PM      Component Value Range Comment   Glucose-Capillary 243 (*) 70 - 99 mg/dL   HEPARIN LEVEL (UNFRACTIONATED)     Status: Normal   Collection Time   09/03/12  6:56 PM      Component Value Range Comment   Heparin Unfractionated 0.41  0.30 - 0.70 IU/mL   GLUCOSE, CAPILLARY     Status: Abnormal   Collection Time   09/03/12  8:33 PM      Component Value Range Comment   Glucose-Capillary 225 (*) 70 - 99 mg/dL   GLUCOSE, CAPILLARY     Status: Abnormal   Collection Time   09/03/12 10:26 PM      Component Value Range Comment   Glucose-Capillary 257 (*) 70 - 99 mg/dL   RENAL FUNCTION PANEL     Status: Abnormal   Collection Time   09/04/12  4:54 AM      Component Value Range Comment   Sodium 137  135 - 145 mEq/L    Potassium 3.4 (*) 3.5 - 5.1 mEq/L    Chloride 84 (*) 96 - 112 mEq/L    CO2 >45  (*) 19 - 32 mEq/L    Glucose, Bld 229 (*) 70 - 99 mg/dL    BUN 86 (*) 6 - 23 mg/dL    Creatinine, Ser 0.45 (*) 0.50 - 1.10 mg/dL    Calcium 9.7  8.4 - 40.9 mg/dL    Phosphorus 3.6  2.3 - 4.6 mg/dL    Albumin 2.9 (*) 3.5 - 5.2 g/dL    GFR calc non Af Amer 24 (*) >90 mL/min    GFR calc Af Amer 28 (*) >90 mL/min   CBC     Status: Abnormal   Collection Time   09/04/12  4:54 AM      Component Value Range Comment   WBC 6.8  4.0 - 10.5 K/uL    RBC 3.22 (*) 3.87 - 5.11 MIL/uL    Hemoglobin  8.6 (*) 12.0 - 15.0 g/dL    HCT 40.9 (*) 81.1 - 46.0 %    MCV 87.6  78.0 - 100.0 fL    MCH 26.7  26.0 - 34.0 pg    MCHC 30.5  30.0 - 36.0 g/dL    RDW 91.4 (*) 78.2 - 15.5 %    Platelets 171  150 - 400 K/uL   HEPARIN LEVEL (UNFRACTIONATED)     Status: Normal   Collection Time   09/04/12  5:25 AM      Component Value Range Comment   Heparin Unfractionated 0.43  0.30 - 0.70 IU/mL   GLUCOSE, CAPILLARY     Status: Abnormal   Collection Time   09/04/12  7:58 AM      Component Value Range Comment   Glucose-Capillary 198 (*) 70 - 99 mg/dL    Comment 1 Documented in Chart      Comment 2 Notify RN     GLUCOSE, CAPILLARY     Status: Abnormal   Collection Time   09/04/12 11:46 AM      Component Value Range Comment   Glucose-Capillary 204 (*) 70 - 99 mg/dL    Comment 1 Documented in Chart      Comment 2 Notify RN       Imaging: No results found.  Assessment:  1. Principal Problem: 2.  *Multiple fractures of both lower extremities 3. Active Problems: 4.  DM 5.  OBSTRUCTIVE SLEEP APNEA 6.  GERD 7.  PAF (paroxysmal atrial fibrillation), was in SR 06/2012  8.  HTN (hypertension) 9.  COPD (chronic obstructive pulmonary disease) 10.  Hyponatremia 11.  CKD (chronic kidney disease) 12.  Pleural effusion 13.  Hypercapnemia 14.  Acute respiratory failure 15.  Chronic diastolic heart failure 16.  Hypokalemia 17.  Alkalosis, metabolic 18.   Plan:  1. A-fib is generally well rate-controlled. Improved  pain control which should result in less endogenous catecholamines, will help with this. Consider PCA.  We will be available as needed over the weekend and resume following post-op or as needed for cardiac issues if they arise before next week.  Time Spent Directly with Patient:  15 minutes  Length of Stay:  LOS: 8 days   Chrystie Nose, MD, [facility] Attending Cardiologist [facility]  HILTY,Kenneth C 09/04/2012, 2:07 PM

## 2012-09-04 NOTE — Progress Notes (Signed)
CRITICAL VALUE ALERT  Critical value received:  CO2 >45  Date of notification:  09/04/12  Time of notification:  0535  Critical value read back:yes  Nurse who received alert:  Julien Girt, RN   MD notified (1st page): Benedetto Coons, NP  Time of first page:  [phone]  MD notified (2nd page):  Time of second page:  Responding MD:  Benedetto Coons, NP  Time MD responded:  [phone]

## 2012-09-04 NOTE — Progress Notes (Signed)
At 2015 today ,Pt c/o of pressure in her bladder, pt said it has been ongoing. Foley cath checked for patency, bladder scan showed greater than of urine. Foley cath taken out and replaced with a new 14Fr catheter; of cloudy foul smelling urine returned. Pt expressed a  feeling of relief. [facility] floor coverage notified, new orders received. Will continue to monitor.

## 2012-09-04 NOTE — Clinical Social Work Note (Signed)
CSW following for support and return to SNF. CSW reviewed chart and spoke with RN for update. No new needs today for CSW. CSW to follow.   Doreen Salvage, LCSW ICU/Stepdown Clinical Social Worker [facility] Cell [phone] Hours 8am-1200pm M-F

## 2012-09-04 NOTE — Progress Notes (Signed)
[facility] PROGRESS NOTE  Gina Ashley YNW:[id number] DOB: 01-21-1927 DOA: 08/27/2012 PCP: Ernestine Conrad, MD  Assessment/Plan:   Multiple fractures of both lower extremities: Pt presently immobilized and is medically stable for surgery. Pt still having significant amounts of pain. I will add oxycodone 5 mg to current regimen of pain medications and observe closely for sedating effects.  Acute on Chronic respiratory failure: Patient's respiratory status has improved considerably with diuresis. The patient has been fully alert and has not exhibited any excessive drowsiness in the last 48 to 72 hours. The patient appears to be very close to her baseline of respiratory function.    DM: Blood sugars are somewhat elevated today. Will incease Lantus and change SSI to add HS coverage.   Hyponatremia: Improved now within normal range there   Acute on CKD (chronic kidney disease): Patient almost back at baseline Cr. 1.7. However she now demonstrates a contraction alkalosis with a serum bicarbonate greater than 45. I'vediscuss with Dr. cold and not in Diamox has been started.     OBSTRUCTIVE SLEEP APNEA; For now wil continue with BiPaP instead of CPAP   Pleural effusion: Clinically improved today. Will hold on any further CXR's unless pt has a clinical decline  Constipation: Pt has had several BM's since receiving miralax.  Hypokalemia: Improved. Will continue to replete as needed  Code Status: DNR Family Communication: Updated daughter and granddaughter who were at the bedside.  Disposition Plan: Likely SNf at the time of discharge.  Gina A.  [facility] Pager [phone] 8PM-8AM, please contact night-coverage at [email or web address], password [email or web address] 09/04/2012, 4:28 PM  LOS: 8 days   Brief narrative: Pt reportedly from [place] [facility] and had an unwitnessed fall around 4:00 am on day of admission. After the fall patient subsequently developed lower extremity discomfort  and was subsequently taken to [facility] ED for further evaluation. History was obtained from daughters in the room and ED records as patient was given dilaudid for BL LE discomfort and subsequently was "sleepy" per family. In the room initially ortho technician was at bedside placing braces and patient had discomfort with manipulation of lower extremities while placing gauze. Since admission, her major issues that has led to a delay in surgical repair of fractures are respiratory failure and renal failure with fluid overload.   Consultants: Dr. Army Melia- Critical Care Dr. Colodonato-Nephrology Dr. Charlann Boxer- Orthopedic Surgery  Procedures:  None  Antibiotics:  None  HPI/Subjective: Pt states that her greatest complaint is the pain on her BLE's. Objective: Filed Vitals:   09/04/12 0754 09/04/12 0800 09/04/12 1200 09/04/12 1400  BP:  145/76 133/63   Pulse:  65 74 79  Temp:  97.6 F (36.4 C) 98.3 F (36.8 C)   TempSrc:  Axillary Oral   Resp:  21 8 17   Height:      Weight:      SpO2: 97% 100% 95% 99%   Weight change: -2.9 kg (-6 lb 6.3 oz)  Intake/Output Summary (Last 24 hours) at 09/04/12 1628 Last data filed at 09/04/12 1500  Gross per 24 hour  Intake    463 ml  Output   5050 ml  Net  -4587 ml    General: Alert, awake, oriented x3, in no acute distress.  HEENT: [place]/AT PEERL, EOMI  Heart: Regular rate and rhythm, without murmurs, rubs, gallops, PMI non-displaced, no heaves or thrills on palpation.  Lungs: Mild bibasilar crackles noted. No increased vocal fremitus resonant to percussion  Abdomen: Soft, nontender, nondistended, positive bowel sounds, no  masses no hepatosplenomegaly noted. Musculoskeletal: No warm swelling or erythema around joints, no spinal tenderness noted.   Data Reviewed: Basic Metabolic Panel:  Lab 09/04/12 4098 09/03/12 0455 09/02/12 0401 09/01/12 0342 08/31/12 0415  NA 137 135 132* 131* 135  K 3.4* 3.5 3.2* 4.2 4.6  CL 84* 86* 87* 88* 93*  CO2 >45* 45* 41*  35* 34*  GLUCOSE 229* 179* 146* 242* 240*  BUN 86* 89* 84* 83* 74*  CREATININE 1.86* 1.91* 2.39* 2.77* 2.69*  CALCIUM 9.7 9.3 8.8 8.7 8.6  MG -- -- -- -- --  PHOS 3.6 3.0 4.3 5.6* 5.8*   Liver Function Tests:  Lab 09/04/12 0454 09/03/12 0455 09/02/12 0401 09/01/12 0342 08/31/12 0415 08/29/12 0440  AST -- -- -- -- -- 13  ALT -- -- -- -- -- 9  ALKPHOS -- -- -- -- -- 91  BILITOT -- -- -- -- -- 0.7  PROT -- -- -- -- -- 6.0  ALBUMIN 2.9* 2.7* 2.7* 2.7* 2.9* --   No results found for this basename: LIPASE:5,AMYLASE:5 in the last 168 hours No results found for this basename: AMMONIA:5 in the last 168 hours CBC:  Lab 09/04/12 0454 09/02/12 0401 08/31/12 0415 08/30/12 0510 08/29/12 0440  WBC 6.8 7.6 9.4 8.9 8.2  NEUTROABS -- -- -- -- --  HGB 8.6* 8.4* 8.5* 8.2* 8.4*  HCT 28.2* 26.3* 27.7* 26.5* 27.0*  MCV 87.6 84.6 87.4 87.2 86.0  PLT 171 134* 141* 116* 100*   Cardiac Enzymes: No results found for this basename: CKTOTAL:5,CKMB:5,CKMBINDEX:5,TROPONINI:5 in the last 168 hours BNP (last 3 results) No results found for this basename: PROBNP:3 in the last 8760 hours CBG:  Lab 09/04/12 1146 09/04/12 0758 09/03/12 2226 09/03/12 2033 09/03/12 1658  GLUCAP 204* 198* 257* 225* 243*    Recent Results (from the past 240 hour(s))  URINE CULTURE     Status: Normal   Collection Time   08/27/12  4:04 PM      Component Value Range Status Comment   Specimen Description URINE, CATHETERIZED   Final    Special Requests NONE   Final    Culture  Setup Time 08/28/2012 01:23   Final    Colony Count NO GROWTH   Final    Culture NO GROWTH   Final    Report Status 08/28/2012 FINAL   Final      Studies: Dg Chest 1 View  08/27/2012  *RADIOLOGY REPORT*  Clinical Data: Post fall, history of atrial fibrillation  CHEST - 1 VIEW  Comparison: 07/26/2012; 07/25/2012; 07/18/2012  Findings:  Grossly unchanged enlarged cardiac silhouette and mediastinal contours.  Interval development of a moderate-sized  right-sided pleural effusion.  Pulmonary vasculature remains indistinct with cephalization of flow.  No definite left-sided pleural effusion. No definite pneumothorax.  Calcification overlying the left lower neck may be within the left lobe of the thyroid.  No definite acute osseous abnormalities.  IMPRESSION: 1.  Interval development of a moderate-sized right-sided pleural effusion with persistent findings of mild pulmonary edema.  Further evaluation with a PA and lateral chest radiograph may be obtained as clinically indicated. 2.  Possible calcification within the left lobe of the thyroid. Further evaluation with thyroid ultrasound may be performed in the non emergent setting as clinically indicated   Original Report Authenticated By: Waynard Reeds, M.D.    Dg Femur Right  08/27/2012  *RADIOLOGY REPORT*  Clinical Data: Post fall, now with severe pain and tenderness involving the distal aspect of the  femur  RIGHT FEMUR - 2 VIEW  Comparison: None.  Findings:  There is an oblique, minimally displaced, slightly impacted fracture of the distal femoral metaphysis.  There is no definitive extension of the fracture into the right total knee replacement hardware.  No definite evidence of hardware failure or loosening. No definite suprapatellar joint effusion, though evaluation degraded due to technique.  No radiopaque foreign body.  IMPRESSION: Oblique, minimally displaced, slightly impacted fracture of the distal femoral metaphysis without definite extension to involve the femoral component of the right total knee replacement.   Original Report Authenticated By: Waynard Reeds, M.D.    Dg Tibia/fibula Left  08/27/2012  *RADIOLOGY REPORT*  Clinical Data: Post fall, now with leg pain  LEFT TIBIA AND FIBULA - 2 VIEW  Comparison: None.  Findings: There is an oblique, displaced fracture of the proximal tibial metaphysis with apparent extension to involve the tibial component of the left total knee hardware, which  appears minimally angulated, apex ventral.  This is the adjacent soft tissue swelling.  No radiopaque foreign body.  Vascular calcifications.  IMPRESSION: Oblique, displaced fracture of the proximal tibial metaphysis with extension to involve the tibial component of the left total knee replacement hardware.   Original Report Authenticated By: Waynard Reeds, M.D.    Ct Head Wo Contrast  08/23/2012  *RADIOLOGY REPORT*  Clinical Data: Tremors and shaking  CT HEAD WITHOUT CONTRAST  Technique:  Contiguous axial images were obtained from the base of the skull through the vertex without contrast.  Comparison: 06/27/2012  Findings: Stable for mild age white matter hypodensity. There is prominence of the sulci and ventricles consistent with brain atrophy.  There is no evidence for acute brain infarct, hemorrhage or mass.  The paranasal sinuses and mastoid air cells are clear.  The skull appears intact.  IMPRESSION:  1.  No acute intracranial abnormalities.   Original Report Authenticated By: Rosealee Albee, M.D.    US Renal Port  08/30/2012  *RADIOLOGY REPORT*  Clinical Data: Acute renal failure  RENAL/URINARY TRACT ULTRASOUND COMPLETE  Comparison: CT 05/22/2012  Findings:  Right Kidney = 10.3 cm.  No hydronephrosis or mass.  A small simple cyst in upper pole.  Left kidney = Not well visualized. Bladder:  Foley catheter placed in the  IMPRESSION:  Normal right kidney.  Left kidney is poorly assessed.   Original Report Authenticated By: Genevive Bi, M.D.    Dg Chest Port 1 View  09/01/2012  *RADIOLOGY REPORT*  Clinical Data: Pleural effusions.  PORTABLE CHEST - 1 VIEW  Comparison: Plain film chest 08/28/2012 and 08/30/2012.  Findings: Right worse than left pleural effusions and airspace disease do not appear changed compared to the 08/28/2012 study. There is cardiomegaly.  Right PICC remains in place.  IMPRESSION: No marked change in right worse than left effusions and airspace disease compared to the  08/28/2012 study.   Original Report Authenticated By: Bernadene Bell. Maricela Curet, M.D.    Dg Chest Port 1 View  08/30/2012  *RADIOLOGY REPORT*  Clinical Data: Fall pleural effusions  PORTABLE CHEST - 1 VIEW  Comparison: Chest radiograph 08/28/2012  Findings: Stable enlarged heart silhouette.  There is a right PICC line in place.  There is generalized central venous congestion and low lung volumes with small pleural effusions. Effusions are improved.  No pneumothorax.  IMPRESSION:  1.  Low lung volumes and pleural effusions with central venous congestion suggest congestive heart failure.  2.  Effusions are slightly improved.  Original Report Authenticated By: Genevive Bi, M.D.    Dg Chest Port 1 View  08/28/2012  *RADIOLOGY REPORT*  Clinical Data: Confirm the line placement.  Status post PICC placement.  PORTABLE CHEST - 1 VIEW  Comparison: 08/27/2012  Findings: The patient is slightly rotated to the left.  Right upper extremity PICC projects over the distal superior vena cava.  Its tip is approximately 3 cm inferior to the carina.  Cardiomegaly, pulmonary vascular congestion, and pulmonary edema persist.  There are bilateral pleural effusions, right greater than left.  No evidence of pneumothorax.  Coarse, chunky calcification in the left neck is unchanged compared to cervical spine radiographs of 2002.  Question if this could be a calcification within the left thyroid lobe.  IMPRESSION:  1. The distal tip of the right upper extremity PICC projects over the distal superior vena cava, in satisfactory position. 2.  Congestive heart failure pattern with bilateral pleural effusions, right greater than left.   Original Report Authenticated By: Britta Mccreedy, M.D.     Scheduled Meds:    . acetaZOLAMIDE  500 mg Oral Q12H  . antiseptic oral rinse  15 mL Mouth Rinse q12n4p  . chlorhexidine  15 mL Mouth Rinse BID  . darbepoetin (ARANESP) injection - NON-DIALYSIS  100 mcg Subcutaneous Q Fri-1800  . diltiazem  90  mg Oral QID  . docusate sodium  100 mg Oral BID  . feeding supplement  30 mL Oral TID WC  . fluticasone  1 puff Inhalation BID  . insulin aspart  0-15 Units Subcutaneous TID WC  . insulin glargine  10 Units Subcutaneous QHS  . isosorbide dinitrate  20 mg Oral TID  . levalbuterol  0.63 mg Nebulization BID  . LORazepam  0.5 mg Oral Once  . pantoprazole  40 mg Oral Daily  . polyethylene glycol  17 g Oral BID  . potassium chloride  40 mEq Oral Q3H  . potassium chloride  40 mEq Oral Daily  . sodium chloride  10-40 mL Intracatheter Q12H  . sotalol  80 mg Oral BID  . torsemide  50 mg Oral Daily  . DISCONTD: sotalol  80 mg Oral BID  . DISCONTD: torsemide  100 mg Oral Daily  . DISCONTD: torsemide  50 mg Oral QPM   Continuous Infusions:    . heparin 1,100 Units/hr (09/04/12 1324)    Principal Problem:  *Multiple fractures of both lower extremities Active Problems:  DM  OBSTRUCTIVE SLEEP APNEA  GERD  PAF (paroxysmal atrial fibrillation), was in SR 06/2012   HTN (hypertension)  COPD (chronic obstructive pulmonary disease)  Hyponatremia  CKD (chronic kidney disease)  Pleural effusion  Hypercapnemia  Acute respiratory failure  Chronic diastolic heart failure  Hypokalemia  Alkalosis, metabolic

## 2012-09-04 NOTE — Progress Notes (Signed)
Patient ID: Gina Ashley, female   DOB: 08-27-1927, 78 y.o.   MRN: [id number] S:better O:BP 145/76  Pulse 65  Temp 97.6 F (36.4 C) (Axillary)  Resp 21  Ht 5\' 3"  (1.6 m)  Wt 110 kg (242 lb 8.1 oz)  BMI 42.96 kg/m2  SpO2 100%  Intake/Output Summary (Last 24 hours) at 09/04/12 1303 Last data filed at 09/04/12 1100  Gross per 24 hour  Intake    412 ml  Output   3970 ml  Net  -3558 ml   Intake/Output: I/O last 3 completed shifts: In: 349 [I.V.:349] Out: 6160 [Urine:6160]  Intake/Output this shift:  Total I/O In: 84 [I.V.:84] Out: 820 [Urine:820] Weight change: -2.9 kg (-6 lb 6.3 oz) Gen:WD WN obese AAF in NAD CVS:IRR Resp:decreased BS UVO:ZDGUY Ext:2+edema   Lab 09/04/12 0454 09/03/12 0455 09/02/12 0401 09/01/12 0342 08/31/12 0415 08/30/12 0510 08/29/12 0440  NA 137 135 132* 131* 135 136 136  K 3.4* 3.5 3.2* 4.2 4.6 4.4 4.4  CL 84* 86* 87* 88* 93* 93* 94*  CO2 >45* 45* 41* 35* 34* 37* 37*  GLUCOSE 229* 179* 146* 242* 240* 217* 178*  BUN 86* 89* 84* 83* 74* 61* 49*  CREATININE 1.86* 1.91* 2.39* 2.77* 2.69* 2.43* 1.94*  ALBUMIN 2.9* 2.7* 2.7* 2.7* 2.9* -- 3.1*  CALCIUM 9.7 9.3 8.8 8.7 8.6 8.5 8.5  PHOS 3.6 3.0 4.3 5.6* 5.8* 5.2* --  AST -- -- -- -- -- -- 13  ALT -- -- -- -- -- -- 9   Liver Function Tests:  Lab 09/04/12 0454 09/03/12 0455 09/02/12 0401 08/29/12 0440  AST -- -- -- 13  ALT -- -- -- 9  ALKPHOS -- -- -- 91  BILITOT -- -- -- 0.7  PROT -- -- -- 6.0  ALBUMIN 2.9* 2.7* 2.7* --   No results found for this basename: LIPASE:3,AMYLASE:3 in the last 168 hours No results found for this basename: AMMONIA:3 in the last 168 hours CBC:  Lab 09/04/12 0454 09/02/12 0401 08/31/12 0415 08/30/12 0510 08/29/12 0440  WBC 6.8 7.6 9.4 -- --  NEUTROABS -- -- -- -- --  HGB 8.6* 8.4* 8.5* -- --  HCT 28.2* 26.3* 27.7* -- --  MCV 87.6 84.6 87.4 87.2 86.0  PLT 171 134* 141* -- --   Cardiac Enzymes: No results found for this basename:  CKTOTAL:5,CKMB:5,CKMBINDEX:5,TROPONINI:5 in the last 168 hours CBG:  Lab 09/04/12 1146 09/04/12 0758 09/03/12 2226 09/03/12 2033 09/03/12 1658  GLUCAP 204* 198* 257* 225* 243*    Iron Studies: No results found for this basename: IRON,TIBC,TRANSFERRIN,FERRITIN in the last 72 hours Studies/Results: No results found.    Marland Kitchen acetaZOLAMIDE  500 mg Oral Q12H  . antiseptic oral rinse  15 mL Mouth Rinse q12n4p  . chlorhexidine  15 mL Mouth Rinse BID  . diltiazem  90 mg Oral QID  . docusate sodium  100 mg Oral BID  . feeding supplement  30 mL Oral TID WC  . fluticasone  1 puff Inhalation BID  . insulin aspart  0-15 Units Subcutaneous TID WC  . insulin glargine  10 Units Subcutaneous QHS  . isosorbide dinitrate  20 mg Oral TID  . levalbuterol  0.63 mg Nebulization BID  . LORazepam  0.5 mg Oral Once  . pantoprazole  40 mg Oral Daily  . polyethylene glycol  17 g Oral BID  . potassium chloride  40 mEq Oral Q3H  . potassium chloride  40 mEq Oral Daily  . sodium chloride  10-40 mL Intracatheter Q12H  . sotalol  80 mg Oral BID  . torsemide  50 mg Oral Daily  . DISCONTD: sotalol  80 mg Oral BID  . DISCONTD: torsemide  100 mg Oral Daily  . DISCONTD: torsemide  50 mg Oral QPM    BMET    Component Value Date/Time   NA 137 09/04/2012 0454   K 3.4* 09/04/2012 0454   CL 84* 09/04/2012 0454   CO2 >45* 09/04/2012 0454   GLUCOSE 229* 09/04/2012 0454   BUN 86* 09/04/2012 0454   CREATININE 1.86* 09/04/2012 0454   CALCIUM 9.7 09/04/2012 0454   GFRNONAA 24* 09/04/2012 0454   GFRAA 28* 09/04/2012 0454   CBC    Component Value Date/Time   WBC 6.8 09/04/2012 0454   RBC 3.22* 09/04/2012 0454   HGB 8.6* 09/04/2012 0454   HCT 28.2* 09/04/2012 0454   PLT 171 09/04/2012 0454   MCV 87.6 09/04/2012 0454   MCH 26.7 09/04/2012 0454   MCHC 30.5 09/04/2012 0454   RDW 16.7* 09/04/2012 0454   LYMPHSABS 0.8 08/27/2012 1650   MONOABS 0.6 08/27/2012 1650   EOSABS 0.2 08/27/2012 1650   BASOSABS 0.0 08/27/2012 1650      Assessment/Plan:  1. AKI/CKD- markedly improved volume status since switching to torsemide 100mg  po bid. And even with lowering pm dose.  Now with contraction alkalosis and agree with holding torsemide and resuming at 50qam tomorrow. Pt is a poor candidate for HD given advanced age, morbid obesity, multiple irreversible chronic medical problems, none of which would improve with RRT, nor her overal QOL. I discussed this with the patient and her daughter. All are in agreement not to attempt RRT. Will try to maximize volume status as well as pulm status and hopefully stabilize renal function prior to proceeding with surgery. Will discuss with Ortho. Need to avoid NSAIDs/COX-II Inhibitors/IV contrast material 2. Metabolic alkalosis- start Diamox 500mg  bid and follow. 3. Leg fractures- as above. Poor pain control at the present and she is very sensitive to narcotics. Would recommend tramadol 25mg  bid for now and consider Palliative care or anesthesia consult to help with balancing her resp depression due to narcotics and adequate pain control. I discussed case with Dr. Charlann Boxer on 08/31/12 and we agreed to hold off on surgery at this time given decompensated CHF/hypercapnea and AKI/CKD. Hopefully her overall condition will improve with volume removal.  4. Hypercapnic respiratory failure/Obesity-hypoventilation syndrome- improved. Cont with O2 via [place] per PCCM 5. Hypokalemia- replete po 6. CHF- mainly right sided. Markedly improved with torsemide. See above 7. ABLA/ACDz- follow H/H and transfuse prn. Will start ESA 8. A fib- rate-controlled 9. HTN- stable 10. Hyponatremia- due to CHF cont with diuresis 11. DM- poorly controlled, defer to primary svc 12. Dispo- poor QOL with multiple comorbidities. Will likely return to SNF once stable vs LTCF if she improves. Agree with discussion and suggestions of Gina Simmonds, Gina Ashley regarding limitations of medical therapy. Palliative care may be required if her clinical  course was to worsen. 13.   Donnavan Covault A

## 2012-09-04 NOTE — Progress Notes (Signed)
Nutrition Follow-up  Intervention:  1. Will change diet to carb mod, dysphagia 3.  2. Will continue current supplements.  3. RD to follow for nutrition plan of care.   Assessment:   Discussed patient in rounds. Per RN, patient eating well. Noted patient with edema of feet and multiple fractures of lower extremities. Patient reported her appetite is lower than normal. She reported she is consuming about 50% of her meals and drinking the Ensure supplement. She requested change of diet consistency for ease of chewing.   Diet Order:  Previously carb Modified, Now NPO as of this morning.   Meds: Scheduled Meds:   . acetaZOLAMIDE  500 mg Oral Q12H  . antiseptic oral rinse  15 mL Mouth Rinse q12n4p  . chlorhexidine  15 mL Mouth Rinse BID  . darbepoetin (ARANESP) injection - NON-DIALYSIS  100 mcg Subcutaneous Q Fri-1800  . diltiazem  90 mg Oral QID  . docusate sodium  100 mg Oral BID  . feeding supplement  30 mL Oral TID WC  . fluticasone  1 puff Inhalation BID  . insulin aspart  0-15 Units Subcutaneous TID WC  . insulin glargine  10 Units Subcutaneous QHS  . isosorbide dinitrate  20 mg Oral TID  . levalbuterol  0.63 mg Nebulization BID  . LORazepam  0.5 mg Oral Once  . pantoprazole  40 mg Oral Daily  . polyethylene glycol  17 g Oral BID  . potassium chloride  40 mEq Oral Q3H  . potassium chloride  40 mEq Oral Daily  . sodium chloride  10-40 mL Intracatheter Q12H  . sotalol  80 mg Oral BID  . torsemide  50 mg Oral Daily  . DISCONTD: sotalol  80 mg Oral BID  . DISCONTD: torsemide  100 mg Oral Daily  . DISCONTD: torsemide  50 mg Oral QPM   Continuous Infusions:   . heparin 1,100 Units/hr (09/04/12 1324)   PRN Meds:.bisacodyl, diphenhydrAMINE-zinc acetate, feeding supplement, HYDROcodone-acetaminophen, levalbuterol, metoprolol, ondansetron (ZOFRAN) IV, ondansetron, sodium chloride, sodium phosphate, traMADol   CMP     Component Value Date/Time   NA 137 09/04/2012 0454   K 3.4*  09/04/2012 0454   CL 84* 09/04/2012 0454   CO2 >45* 09/04/2012 0454   GLUCOSE 229* 09/04/2012 0454   BUN 86* 09/04/2012 0454   CREATININE 1.86* 09/04/2012 0454   CALCIUM 9.7 09/04/2012 0454   PROT 6.0 08/29/2012 0440   ALBUMIN 2.9* 09/04/2012 0454   AST 13 08/29/2012 0440   ALT 9 08/29/2012 0440   ALKPHOS 91 08/29/2012 0440   BILITOT 0.7 08/29/2012 0440   GFRNONAA 24* 09/04/2012 0454   GFRAA 28* 09/04/2012 0454    CBG (last 3)   Basename 09/04/12 1146 09/04/12 0758 09/03/12 2226  GLUCAP 204* 198* 257*     Intake/Output Summary (Last 24 hours) at 09/04/12 1353 Last data filed at 09/04/12 1100  Gross per 24 hour  Intake    412 ml  Output   3970 ml  Net  -3558 ml    Weight Status:   Wt Readings from Last 10 Encounters:  09/04/12 242 lb 8.1 oz (110 kg)  08/25/12 241 lb 2.9 oz (109.4 kg)  06/16/12 221 lb 5.5 oz (100.4 kg)  07/11/11 208 lb (94.348 kg)  07/11/11 208 lb (94.348 kg)  06/14/11 212 lb 12.8 oz (96.525 kg)  06/03/11 208 lb 3.2 oz (94.439 kg)  04/17/11 215 lb (97.523 kg)  09/20/08 225 lb (102.059 kg)  08/19/08 226 lb 6.1 oz (102.686  kg)  *Pt with weight fluctuations likely due to changes in fluid status.    Re-estimated needs remain the same: 1700-1800 kcal and 100-110 grams protein.   Nutrition Dx:  Inadequate Oral Intake, ongoing.   Goal:  Improved respiratory status to allow pt to eat.  PO intake > 50% at meals.   Monitor:  PO intake, weights, labs, pt status   Adron Bene [phone]

## 2012-09-05 DIAGNOSIS — N390 Urinary tract infection, site not specified: Secondary | ICD-10-CM | POA: Diagnosis not present

## 2012-09-05 LAB — RENAL FUNCTION PANEL
Albumin: 3 g/dL — ABNORMAL LOW (ref 3.5–5.2)
BUN: 83 mg/dL — ABNORMAL HIGH (ref 6–23)
Calcium: 9.9 mg/dL (ref 8.4–10.5)
Creatinine, Ser: 1.82 mg/dL — ABNORMAL HIGH (ref 0.50–1.10)
Glucose, Bld: 168 mg/dL — ABNORMAL HIGH (ref 70–99)
Phosphorus: 4.1 mg/dL (ref 2.3–4.6)

## 2012-09-05 LAB — CBC
HCT: 28.4 % — ABNORMAL LOW (ref 36.0–46.0)
Hemoglobin: 8.6 g/dL — ABNORMAL LOW (ref 12.0–15.0)
MCHC: 30.3 g/dL (ref 30.0–36.0)
MCV: 87.9 fL (ref 78.0–100.0)
RDW: 16.5 % — ABNORMAL HIGH (ref 11.5–15.5)

## 2012-09-05 LAB — HEMOGLOBIN A1C: Hgb A1c MFr Bld: 7 % — ABNORMAL HIGH (ref <5.7)

## 2012-09-05 LAB — GLUCOSE, CAPILLARY
Glucose-Capillary: 153 mg/dL — ABNORMAL HIGH (ref 70–99)
Glucose-Capillary: 197 mg/dL — ABNORMAL HIGH (ref 70–99)
Glucose-Capillary: 178 mg/dL — ABNORMAL HIGH (ref 70–99)

## 2012-09-05 LAB — HEPARIN LEVEL (UNFRACTIONATED): Heparin Unfractionated: 0.22 IU/mL — ABNORMAL LOW (ref 0.30–0.70)

## 2012-09-05 MED ORDER — CIPROFLOXACIN IN D5W 400 MG/200ML IV SOLN
400.0000 mg | Freq: Two times a day (BID) | INTRAVENOUS | Status: DC
  Administered 2012-09-05 – 2012-09-06 (×2): 400 mg via INTRAVENOUS
  Filled 2012-09-05 (×3): qty 200

## 2012-09-05 MED ORDER — LIDOCAINE 5 % EX PTCH
2.0000 | MEDICATED_PATCH | CUTANEOUS | Status: DC
  Administered 2012-09-05 – 2012-09-15 (×11): 2 via TRANSDERMAL
  Filled 2012-09-05 (×11): qty 2

## 2012-09-05 MED ORDER — HEPARIN (PORCINE) IN NACL 100-0.45 UNIT/ML-% IJ SOLN
1450.0000 [IU]/h | INTRAMUSCULAR | Status: DC
  Administered 2012-09-06 – 2012-09-07 (×2): 1450 [IU]/h via INTRAVENOUS
  Filled 2012-09-05 (×4): qty 250

## 2012-09-05 NOTE — Progress Notes (Signed)
ANTICOAGULATION CONSULT NOTE - follow up Consult  Pharmacy Consult for IV Heparin  Indication: A.Fib  Allergies  Allergen Reactions  . Morphine Sulfate Other (See Comments)    REACTION: change in personality  . Remeron (Mirtazapine) Other (See Comments)    Altered mental status, lethargy  . Tuna (Fish Allergy)   . Penicillins Rash    Patient Measurements: Height: 5\' 3"  (160 cm) Weight: 242 lb 8.1 oz (110 kg) IBW/kg (Calculated) : 52.4  TBW = 112.9 kg Heparin dosing weight = 80 kg  Vital Signs: Temp: 97.4 F (36.3 C) (11/02 0800) Temp src: Axillary (11/02 0800) BP: 111/63 mmHg (11/02 0800) Pulse Rate: 61  (11/02 0800)  Labs:  Gina Ashley 09/05/12 0457 09/04/12 0525 09/04/12 0454 09/03/12 1856 09/03/12 0455  HGB 8.6* -- 8.6* -- --  HCT 28.4* -- 28.2* -- --  PLT 221 -- 171 -- --  APTT -- -- -- -- --  LABPROT -- -- -- -- 16.9*  INR -- -- -- -- 1.41  HEPARINUNFRC 0.22* 0.43 -- 0.41 --  CREATININE 1.82* -- 1.86* -- 1.91*  CKTOTAL -- -- -- -- --  CKMB -- -- -- -- --  TROPONINI -- -- -- -- --    Estimated Creatinine Clearance: 27.9 ml/min (by C-G formula based on Cr of 1.82).   Medical History: Past Medical History  Diagnosis Date  . Diabetes mellitus     x 15 yrs  . GERD (gastroesophageal reflux disease)   . Back pain, chronic   . Pain in shoulder   . Pain in ankle   . Hiatal hernia   . HTN (hypertension)     all her life  . CKD (chronic kidney disease), stage III     GFR: 38  . Atrial fibrillation     since 1996  . Sleep apnea   . Fall 3 weeks ago    was [facility] at [facility]  . OSA on CPAP   . Depression   . PONV (postoperative nausea and vomiting)     difficult to wake up  . Chronic diastolic heart failure 08/31/2012    Medications:  Scheduled:     . acetaZOLAMIDE  500 mg Oral Q12H  . antiseptic oral rinse  15 mL Mouth Rinse q12n4p  . chlorhexidine  15 mL Mouth Rinse BID  . darbepoetin (ARANESP) injection - NON-DIALYSIS  100 mcg  Subcutaneous Q Fri-1800  . diltiazem  90 mg Oral QID  . docusate sodium  100 mg Oral BID  . feeding supplement  30 mL Oral TID WC  . fluticasone  1 puff Inhalation BID  . insulin aspart  0-15 Units Subcutaneous TID WC  . insulin aspart  0-5 Units Subcutaneous QHS  . insulin glargine  15 Units Subcutaneous QHS  . isosorbide dinitrate  20 mg Oral TID  . levalbuterol  0.63 mg Nebulization BID  . pantoprazole  40 mg Oral Daily  . polyethylene glycol  17 g Oral BID  . potassium chloride  40 mEq Oral Q3H  . potassium chloride  40 mEq Oral Daily  . sodium chloride  10-40 mL Intracatheter Q12H  . sotalol  80 mg Oral BID  . torsemide  50 mg Oral Daily  . DISCONTD: insulin aspart  0-15 Units Subcutaneous TID WC  . DISCONTD: insulin glargine  10 Units Subcutaneous QHS  . DISCONTD: torsemide  100 mg Oral Daily   Infusions:     . heparin 1,100 Units/hr (09/04/12 1324)   PRN: bisacodyl, diphenhydrAMINE-zinc  acetate, feeding supplement, HYDROcodone-acetaminophen, levalbuterol, metoprolol, ondansetron (ZOFRAN) IV, ondansetron, oxyCODONE, sodium chloride, sodium phosphate, traMADol  Assessment:  77 YOF with a history of A.fib on chronic coumadin.  Admitted on 10/24 with b/l LE fractures s/p fall. Surgical intervention tentatively planned for 11/4.  Plan is to hold coumadin and bridge with IV heparin prior to surgery  Chronic renal insufficiency noted, Scr improving  H/H low but stable.  No bleeding reported  HL 0.22 on 1100 units/hour - below goal range, will increase dose and recheck heparin level    Goal of Therapy:  Heparin level 0.3-0.7 units/ml Monitor platelets by anticoagulation protocol: Yes   Plan:  Increase heparin to 1300 units/hr (= 22ml/hr) Repeat Heparin level in 8 hours Daily HL and CBC  Gina Ashley, Loma Messing PharmD Pager #: [phone] 8:40 AM 09/05/2012

## 2012-09-05 NOTE — Progress Notes (Signed)
[facility] PROGRESS NOTE  Gina Ashley GMW:[id number] DOB: [birth year] DOA: 08/27/2012 PCP: Ernestine Conrad, MD  Assessment/Plan:  Acute on Chronic respiratory failure: Patient's respiratory status has improved considerably with diuresis. The patient has been fully alert and has not exhibited any excessive drowsiness in the last 48 to 72 hours. The patient appears to be very close to her baseline of respiratory function.   Acute on CKD (chronic kidney disease): Patient almost back at baseline Cr. 1.7. However she now demonstrates a contraction alkalosis with a serum bicarbonate greater than 45. I'vediscuss with Dr. cold and not in Diamox has been started.     Multiple fractures of both lower extremities:  Pt presently immobilized and is medically stable for surgery. Pt still having significant amounts of pain. I will add oxycodone 5 mg to current regimen of pain medications and observe closely for sedating effects. We'll add Lidoderm patches for added pain control. Surgery scheduled for Monday, 09/07/2012.  UTI:  Patient has a urinalysis consistent with urinary tract infection. Urine cultures have been sent and are pending at this time. We'll start the patient empirically on ciprofloxacin.     DM:  Blood sugars are somewhat elevated today. Will incease Lantus and change SSI to add HS coverage.   Hyponatremia:  Improved now within normal range there    OBSTRUCTIVE SLEEP APNEA; For now wil continue with BiPaP instead of CPAP   Pleural effusion: Clinically improved today. Will hold on any further CXR's unless pt has a clinical decline  Constipation: Pt has had several BM's since receiving miralax.  Hypokalemia: Improved. Will continue to replete as needed  Code Status: DNR Family Communication: Updated daughter and granddaughter who were at the bedside.  Disposition Plan: Likely SNf at the time of discharge.  MATTHEWS,MICHELLE A.  [facility] Pager [phone]  8PM-8AM, please contact night-coverage at [email or web address], password [facility] 09/05/2012, 5:58 PM  LOS: 9 days   Brief narrative: Pt reportedly from [facility] and had an unwitnessed fall around 4:00 am on day of admission. After the fall patient subsequently developed lower extremity discomfort and was subsequently taken to [facility] ED for further evaluation. History was obtained from daughters in the room and ED records as patient was given dilaudid for BL LE discomfort and subsequently was "sleepy" per family. In the room initially ortho technician was at bedside placing braces and patient had discomfort with manipulation of lower extremities while placing gauze. Since admission, her major issues that has led to a delay in surgical repair of fractures are respiratory failure and renal failure with fluid overload.   Consultants: Dr. Army Melia- Critical Care Dr. Colodonato-Nephrology Dr. Charlann Boxer- Orthopedic Surgery  Procedures:  None  Antibiotics:  None  HPI/Subjective: Pt still complaining of pain in bilateral lower extremities which are moderately controlled by pain medications here. Objective: Filed Vitals:   09/05/12 1000 09/05/12 1200 09/05/12 1400 09/05/12 1600  BP: 134/66 104/53 123/57 125/51  Pulse: 98 82 70 79  Temp:  98.1 F (36.7 C)    TempSrc:  Oral    Resp: 12 10 10 14   Height:      Weight:      SpO2: 92% 94% 98% 99%   Weight change:   Intake/Output Summary (Last 24 hours) at 09/05/12 1758 Last data filed at 09/05/12 1700  Gross per 24 hour  Intake    921 ml  Output   3420 ml  Net  -2499 ml    General: Alert, awake, oriented x3, in no acute distress.  HEENT: [place]/AT PEERL, EOMI  Heart: Regular rate and rhythm, without murmurs, rubs, gallops, PMI non-displaced, no heaves or thrills on palpation.  Lungs: Mild bibasilar crackles noted. No increased vocal fremitus resonant to percussion  Abdomen: Soft, nontender, nondistended, positive bowel sounds, no masses no  hepatosplenomegaly noted. Musculoskeletal: No warm swelling or erythema around joints, no spinal tenderness noted.   Data Reviewed: Basic Metabolic Panel:  Lab 09/05/12 1478 09/04/12 0454 09/03/12 0455 09/02/12 0401 09/01/12 0342  NA 136 137 135 132* 131*  K 3.9 3.4* 3.5 3.2* 4.2  CL 83* 84* 86* 87* 88*  CO2 >45* >45* 45* 41* 35*  GLUCOSE 168* 229* 179* 146* 242*  BUN 83* 86* 89* 84* 83*  CREATININE 1.82* 1.86* 1.91* 2.39* 2.77*  CALCIUM 9.9 9.7 9.3 8.8 8.7  MG -- -- -- -- --  PHOS 4.1 3.6 3.0 4.3 5.6*   Liver Function Tests:  Lab 09/05/12 0457 09/04/12 0454 09/03/12 0455 09/02/12 0401 09/01/12 0342  AST -- -- -- -- --  ALT -- -- -- -- --  ALKPHOS -- -- -- -- --  BILITOT -- -- -- -- --  PROT -- -- -- -- --  ALBUMIN 3.0* 2.9* 2.7* 2.7* 2.7*   No results found for this basename: LIPASE:5,AMYLASE:5 in the last 168 hours No results found for this basename: AMMONIA:5 in the last 168 hours CBC:  Lab 09/05/12 0457 09/04/12 0454 09/02/12 0401 08/31/12 0415 08/30/12 0510  WBC 8.6 6.8 7.6 9.4 8.9  NEUTROABS -- -- -- -- --  HGB 8.6* 8.6* 8.4* 8.5* 8.2*  HCT 28.4* 28.2* 26.3* 27.7* 26.5*  MCV 87.9 87.6 84.6 87.4 87.2  PLT 221 171 134* 141* 116*   Cardiac Enzymes: No results found for this basename: CKTOTAL:5,CKMB:5,CKMBINDEX:5,TROPONINI:5 in the last 168 hours BNP (last 3 results) No results found for this basename: PROBNP:3 in the last 8760 hours CBG:  Lab 09/05/12 1600 09/05/12 1220 09/05/12 0743 09/04/12 2207 09/04/12 1621  GLUCAP 197* 207* 153* 182* 202*    Recent Results (from the past 240 hour(s))  URINE CULTURE     Status: Normal   Collection Time   08/27/12  4:04 PM      Component Value Range Status Comment   Specimen Description URINE, CATHETERIZED   Final    Special Requests NONE   Final    Culture  Setup Time 08/28/2012 01:23   Final    Colony Count NO GROWTH   Final    Culture NO GROWTH   Final    Report Status 08/28/2012 FINAL   Final      Studies: Dg  Chest 1 View  08/27/2012  *RADIOLOGY REPORT*  Clinical Data: Post fall, history of atrial fibrillation  CHEST - 1 VIEW  Comparison: 07/26/2012; 07/25/2012; 07/18/2012  Findings:  Grossly unchanged enlarged cardiac silhouette and mediastinal contours.  Interval development of a moderate-sized right-sided pleural effusion.  Pulmonary vasculature remains indistinct with cephalization of flow.  No definite left-sided pleural effusion. No definite pneumothorax.  Calcification overlying the left lower neck may be within the left lobe of the thyroid.  No definite acute osseous abnormalities.  IMPRESSION: 1.  Interval development of a moderate-sized right-sided pleural effusion with persistent findings of mild pulmonary edema.  Further evaluation with a PA and lateral chest radiograph may be obtained as clinically indicated. 2.  Possible calcification within the left lobe of the thyroid. Further evaluation with thyroid ultrasound may be performed in the non emergent setting as clinically indicated   Original  Report Authenticated By: Waynard Reeds, M.D.    Dg Femur Right  08/27/2012  *RADIOLOGY REPORT*  Clinical Data: Post fall, now with severe pain and tenderness involving the distal aspect of the femur  RIGHT FEMUR - 2 VIEW  Comparison: None.  Findings:  There is an oblique, minimally displaced, slightly impacted fracture of the distal femoral metaphysis.  There is no definitive extension of the fracture into the right total knee replacement hardware.  No definite evidence of hardware failure or loosening. No definite suprapatellar joint effusion, though evaluation degraded due to technique.  No radiopaque foreign body.  IMPRESSION: Oblique, minimally displaced, slightly impacted fracture of the distal femoral metaphysis without definite extension to involve the femoral component of the right total knee replacement.   Original Report Authenticated By: Waynard Reeds, M.D.    Dg Tibia/fibula Left  08/27/2012   *RADIOLOGY REPORT*  Clinical Data: Post fall, now with leg pain  LEFT TIBIA AND FIBULA - 2 VIEW  Comparison: None.  Findings: There is an oblique, displaced fracture of the proximal tibial metaphysis with apparent extension to involve the tibial component of the left total knee hardware, which appears minimally angulated, apex ventral.  This is the adjacent soft tissue swelling.  No radiopaque foreign body.  Vascular calcifications.  IMPRESSION: Oblique, displaced fracture of the proximal tibial metaphysis with extension to involve the tibial component of the left total knee replacement hardware.   Original Report Authenticated By: Waynard Reeds, M.D.    Ct Head Wo Contrast  08/23/2012  *RADIOLOGY REPORT*  Clinical Data: Tremors and shaking  CT HEAD WITHOUT CONTRAST  Technique:  Contiguous axial images were obtained from the base of the skull through the vertex without contrast.  Comparison: 06/27/2012  Findings: Stable for mild age white matter hypodensity. There is prominence of the sulci and ventricles consistent with brain atrophy.  There is no evidence for acute brain infarct, hemorrhage or mass.  The paranasal sinuses and mastoid air cells are clear.  The skull appears intact.  IMPRESSION:  1.  No acute intracranial abnormalities.   Original Report Authenticated By: Rosealee Albee, M.D.    US Renal Port  08/30/2012  *RADIOLOGY REPORT*  Clinical Data: Acute renal failure  RENAL/URINARY TRACT ULTRASOUND COMPLETE  Comparison: CT 05/22/2012  Findings:  Right Kidney = 10.3 cm.  No hydronephrosis or mass.  A small simple cyst in upper pole.  Left kidney = Not well visualized. Bladder:  Foley catheter placed in the  IMPRESSION:  Normal right kidney.  Left kidney is poorly assessed.   Original Report Authenticated By: Genevive Bi, M.D.    Dg Chest Port 1 View  09/01/2012  *RADIOLOGY REPORT*  Clinical Data: Pleural effusions.  PORTABLE CHEST - 1 VIEW  Comparison: Plain film chest 08/28/2012 and  08/30/2012.  Findings: Right worse than left pleural effusions and airspace disease do not appear changed compared to the 08/28/2012 study. There is cardiomegaly.  Right PICC remains in place.  IMPRESSION: No marked change in right worse than left effusions and airspace disease compared to the 08/28/2012 study.   Original Report Authenticated By: Bernadene Bell. Maricela Curet, M.D.    Dg Chest Port 1 View  08/30/2012  *RADIOLOGY REPORT*  Clinical Data: Fall pleural effusions  PORTABLE CHEST - 1 VIEW  Comparison: Chest radiograph 08/28/2012  Findings: Stable enlarged heart silhouette.  There is a right PICC line in place.  There is generalized central venous congestion and low lung volumes with  small pleural effusions. Effusions are improved.  No pneumothorax.  IMPRESSION:  1.  Low lung volumes and pleural effusions with central venous congestion suggest congestive heart failure.  2.  Effusions are slightly improved.   Original Report Authenticated By: Genevive Bi, M.D.    Dg Chest Port 1 View  08/28/2012  *RADIOLOGY REPORT*  Clinical Data: Confirm the line placement.  Status post PICC placement.  PORTABLE CHEST - 1 VIEW  Comparison: 08/27/2012  Findings: The patient is slightly rotated to the left.  Right upper extremity PICC projects over the distal superior vena cava.  Its tip is approximately 3 cm inferior to the carina.  Cardiomegaly, pulmonary vascular congestion, and pulmonary edema persist.  There are bilateral pleural effusions, right greater than left.  No evidence of pneumothorax.  Coarse, chunky calcification in the left neck is unchanged compared to cervical spine radiographs of 2002.  Question if this could be a calcification within the left thyroid lobe.  IMPRESSION:  1. The distal tip of the right upper extremity PICC projects over the distal superior vena cava, in satisfactory position. 2.  Congestive heart failure pattern with bilateral pleural effusions, right greater than left.   Original Report  Authenticated By: Britta Mccreedy, M.D.     Scheduled Meds:    . acetaZOLAMIDE  500 mg Oral Q12H  . antiseptic oral rinse  15 mL Mouth Rinse q12n4p  . chlorhexidine  15 mL Mouth Rinse BID  . ciprofloxacin  400 mg Intravenous Q12H  . darbepoetin (ARANESP) injection - NON-DIALYSIS  100 mcg Subcutaneous Q Fri-1800  . diltiazem  90 mg Oral QID  . docusate sodium  100 mg Oral BID  . feeding supplement  30 mL Oral TID WC  . fluticasone  1 puff Inhalation BID  . insulin aspart  0-15 Units Subcutaneous TID WC  . insulin aspart  0-5 Units Subcutaneous QHS  . insulin glargine  15 Units Subcutaneous QHS  . isosorbide dinitrate  20 mg Oral TID  . levalbuterol  0.63 mg Nebulization BID  . lidocaine  2 patch Transdermal Q24H  . pantoprazole  40 mg Oral Daily  . polyethylene glycol  17 g Oral BID  . potassium chloride  40 mEq Oral Q3H  . potassium chloride  40 mEq Oral Daily  . sodium chloride  10-40 mL Intracatheter Q12H  . sotalol  80 mg Oral BID  . DISCONTD: torsemide  50 mg Oral Daily   Continuous Infusions:    . heparin 1,300 Units/hr (09/05/12 1230)    Principal Problem:  *Multiple fractures of both lower extremities Active Problems:  DM  OBSTRUCTIVE SLEEP APNEA  GERD  PAF (paroxysmal atrial fibrillation), was in SR 06/2012   HTN (hypertension)  COPD (chronic obstructive pulmonary disease)  Hyponatremia  CKD (chronic kidney disease)  Pleural effusion  Hypercapnemia  Acute respiratory failure  Chronic diastolic heart failure  Hypokalemia  Alkalosis, metabolic  Pain  UTI (lower urinary tract infection)

## 2012-09-05 NOTE — Progress Notes (Signed)
Patient ID: Gina Ashley, female   DOB: July 20, 1927, 78 y.o.   MRN: [id number] S:currently resting and pain free O:BP 111/63  Pulse 61  Temp 97.4 F (36.3 C) (Axillary)  Resp 10  Ht 5\' 3"  (1.6 m)  Wt 110 kg (242 lb 8.1 oz)  BMI 42.96 kg/m2  SpO2 96%  Intake/Output Summary (Last 24 hours) at 09/05/12 0835 Last data filed at 09/05/12 0800  Gross per 24 hour  Intake    633 ml  Output   4620 ml  Net  -3987 ml   Intake/Output: I/O last 3 completed shifts: In: 765 [I.V.:745; IV Piggyback:20] Out: 7695 [Urine:7695]  Intake/Output this shift:  Total I/O In: 141 [P.O.:120; I.V.:21] Out: 75 [Urine:75] Weight change:  Gen:WD obese AAF inNAD CVS:IRR Resp:diminished BS at bases JXB:JYNWGN Ext:2+edema   Lab 09/05/12 0457 09/04/12 0454 09/03/12 0455 09/02/12 0401 09/01/12 0342 08/31/12 0415 08/30/12 0510  NA 136 137 135 132* 131* 135 136  K 3.9 3.4* 3.5 3.2* 4.2 4.6 4.4  CL 83* 84* 86* 87* 88* 93* 93*  CO2 >45* >45* 45* 41* 35* 34* 37*  GLUCOSE 168* 229* 179* 146* 242* 240* 217*  BUN 83* 86* 89* 84* 83* 74* 61*  CREATININE 1.82* 1.86* 1.91* 2.39* 2.77* 2.69* 2.43*  ALBUMIN 3.0* 2.9* 2.7* 2.7* 2.7* 2.9* --  CALCIUM 9.9 9.7 9.3 8.8 8.7 8.6 8.5  PHOS 4.1 3.6 3.0 4.3 5.6* 5.8* 5.2*  AST -- -- -- -- -- -- --  ALT -- -- -- -- -- -- --   Liver Function Tests:  Lab 09/05/12 0457 09/04/12 0454 09/03/12 0455  AST -- -- --  ALT -- -- --  ALKPHOS -- -- --  BILITOT -- -- --  PROT -- -- --  ALBUMIN 3.0* 2.9* 2.7*   No results found for this basename: LIPASE:3,AMYLASE:3 in the last 168 hours No results found for this basename: AMMONIA:3 in the last 168 hours CBC:  Lab 09/05/12 0457 09/04/12 0454 09/02/12 0401 08/31/12 0415 08/30/12 0510  WBC 8.6 6.8 7.6 -- --  NEUTROABS -- -- -- -- --  HGB 8.6* 8.6* 8.4* -- --  HCT 28.4* 28.2* 26.3* -- --  MCV 87.9 87.6 84.6 87.4 87.2  PLT 221 171 134* -- --   Cardiac Enzymes: No results found for this basename:  CKTOTAL:5,CKMB:5,CKMBINDEX:5,TROPONINI:5 in the last 168 hours CBG:  Lab 09/05/12 0743 09/04/12 2207 09/04/12 1621 09/04/12 1146 09/04/12 0758  GLUCAP 153* 182* 202* 204* 198*    Iron Studies: No results found for this basename: IRON,TIBC,TRANSFERRIN,FERRITIN in the last 72 hours Studies/Results: No results found.    Marland Kitchen acetaZOLAMIDE  500 mg Oral Q12H  . antiseptic oral rinse  15 mL Mouth Rinse q12n4p  . chlorhexidine  15 mL Mouth Rinse BID  . darbepoetin (ARANESP) injection - NON-DIALYSIS  100 mcg Subcutaneous Q Fri-1800  . diltiazem  90 mg Oral QID  . docusate sodium  100 mg Oral BID  . feeding supplement  30 mL Oral TID WC  . fluticasone  1 puff Inhalation BID  . insulin aspart  0-15 Units Subcutaneous TID WC  . insulin aspart  0-5 Units Subcutaneous QHS  . insulin glargine  15 Units Subcutaneous QHS  . isosorbide dinitrate  20 mg Oral TID  . levalbuterol  0.63 mg Nebulization BID  . pantoprazole  40 mg Oral Daily  . polyethylene glycol  17 g Oral BID  . potassium chloride  40 mEq Oral Q3H  . potassium chloride  40  mEq Oral Daily  . sodium chloride  10-40 mL Intracatheter Q12H  . sotalol  80 mg Oral BID  . torsemide  50 mg Oral Daily  . DISCONTD: insulin aspart  0-15 Units Subcutaneous TID WC  . DISCONTD: insulin glargine  10 Units Subcutaneous QHS  . DISCONTD: torsemide  100 mg Oral Daily    BMET    Component Value Date/Time   NA 136 09/05/2012 0457   K 3.9 09/05/2012 0457   CL 83* 09/05/2012 0457   CO2 >45* 09/05/2012 0457   GLUCOSE 168* 09/05/2012 0457   BUN 83* 09/05/2012 0457   CREATININE 1.82* 09/05/2012 0457   CALCIUM 9.9 09/05/2012 0457   GFRNONAA 25* 09/05/2012 0457   GFRAA 28* 09/05/2012 0457   CBC    Component Value Date/Time   WBC 8.6 09/05/2012 0457   RBC 3.23* 09/05/2012 0457   HGB 8.6* 09/05/2012 0457   HCT 28.4* 09/05/2012 0457   PLT 221 09/05/2012 0457   MCV 87.9 09/05/2012 0457   MCH 26.6 09/05/2012 0457   MCHC 30.3 09/05/2012 0457   RDW 16.5* 09/05/2012  0457   LYMPHSABS 0.8 08/27/2012 1650   MONOABS 0.6 08/27/2012 1650   EOSABS 0.2 08/27/2012 1650   BASOSABS 0.0 08/27/2012 1650    Assessment/Plan:  1. AKI/CKD- markedly improved volume status since switching to torsemide 100mg  po bid.  Now with contraction alkalosis and agree with holding torsemide.  Still put out 5L of urine without torsemide.  Will hold torsemide today and follow. Pt is a poor candidate for HD given advanced age, morbid obesity, multiple irreversible chronic medical problems, none of which would improve with RRT, nor her overal QOL. I discussed this with the patient and her daughter. All are in agreement not to attempt RRT. Will try to maximize volume status as well as pulm status and hopefully stabilize renal function prior to proceeding with surgery. Will discuss with Ortho. Need to avoid NSAIDs/COX-II Inhibitors/IV contrast material 2. Metabolic alkalosis- start Diamox 500mg  bid and follow. 3. Leg fractures- as above. Poor pain control at the present and she is very sensitive to narcotics. Would recommend tramadol 25mg  bid for now and consider Palliative care or anesthesia consult to help with balancing her resp depression due to narcotics and adequate pain control. I discussed case with Dr. Charlann Boxer on 08/31/12 and we agreed to hold off on surgery at this time given decompensated CHF/hypercapnea and AKI/CKD. Hopefully her overall condition will improve with volume removal.  4. Hypercapnic respiratory failure/Obesity-hypoventilation syndrome- improved. Cont with O2 via [place] per PCCM 5. Hypokalemia- replete po 6. CHF- mainly right sided. Markedly improved with torsemide. See above 7. ABLA/ACDz- follow H/H and transfuse prn. Will start ESA 8. A fib- rate-controlled 9. HTN- stable 10. Hyponatremia- due to CHF cont with diuresis 11. DM- poorly controlled, defer to primary svc 12. Dispo- poor QOL with multiple comorbidities. Will likely return to SNF once stable vs LTCF if she improves.  Agree with discussion and suggestions of Anders Simmonds, NP regarding limitations of medical therapy. Palliative care may be required if her clinical course was to worsen. 13. Will cont to hold torsemide today given significant diuresis without pm dose yesterday.    Dyllin Gulley A

## 2012-09-06 LAB — GLUCOSE, CAPILLARY

## 2012-09-06 LAB — CBC
MCH: 26.2 pg (ref 26.0–34.0)
MCV: 88.8 fL (ref 78.0–100.0)
Platelets: 232 10*3/uL (ref 150–400)
RBC: 3.4 MIL/uL — ABNORMAL LOW (ref 3.87–5.11)

## 2012-09-06 LAB — BASIC METABOLIC PANEL
CO2: >45 mEq/L (ref 19–32)
Calcium: 9.9 mg/dL (ref 8.4–10.5)
Creatinine, Ser: 1.71 mg/dL — ABNORMAL HIGH (ref 0.50–1.10)
Glucose, Bld: 114 mg/dL — ABNORMAL HIGH (ref 70–99)

## 2012-09-06 LAB — ALBUMIN: Albumin: 3 g/dL — ABNORMAL LOW (ref 3.5–5.2)

## 2012-09-06 LAB — PHOSPHORUS: Phosphorus: 3.8 mg/dL (ref 2.3–4.6)

## 2012-09-06 MED ORDER — SOTALOL HCL 80 MG PO TABS
80.0000 mg | ORAL_TABLET | Freq: Two times a day (BID) | ORAL | Status: DC
  Administered 2012-09-06 – 2012-09-08 (×5): 80 mg via ORAL
  Filled 2012-09-06 (×8): qty 1

## 2012-09-06 MED ORDER — MAGNESIUM CITRATE PO SOLN
1.0000 | Freq: Once | ORAL | Status: AC
  Administered 2012-09-06: 1 via ORAL
  Filled 2012-09-06: qty 296

## 2012-09-06 MED ORDER — ACETAZOLAMIDE ER 500 MG PO CP12
500.0000 mg | ORAL_CAPSULE | Freq: Two times a day (BID) | ORAL | Status: DC
  Administered 2012-09-06 – 2012-09-08 (×5): 500 mg via ORAL
  Filled 2012-09-06 (×8): qty 1

## 2012-09-06 NOTE — Progress Notes (Signed)
Patient ID: Gina Ashley, female   DOB: [birth year], 78 y.o.   MRN: [id number] S:a little groggy this am due to vicodin.  Still c/o pain O:BP 118/54  Pulse 57  Temp 98 F (36.7 C) (Oral)  Resp 13  Ht 5\' 3"  (1.6 m)  Wt 110 kg (242 lb 8.1 oz)  BMI 42.96 kg/m2  SpO2 100%  Intake/Output Summary (Last 24 hours) at 09/06/12 0900 Last data filed at 09/06/12 0800  Gross per 24 hour  Intake 1062.5 ml  Output   2575 ml  Net -1512.5 ml   Intake/Output: I/O last 3 completed shifts: In: 1413 [P.O.:120; I.V.:1093; IV Piggyback:200] Out: 4145 [Urine:4145]  Intake/Output this shift:  Total I/O In: 44.5 [I.V.:44.5] Out: 200 [Urine:200] Weight change:  Gen:WD obese AAF in mild discomfort CVS:no rub Resp:CTA WGN:FAOZHY Ext:2+ pedal edema   Lab 09/06/12 0525 09/06/12 0500 09/05/12 0457 09/04/12 0454 09/03/12 0455 09/02/12 0401 09/01/12 0342 08/31/12 0415  NA -- 136 136 137 135 132* 131* 135  K -- 3.4* 3.9 3.4* 3.5 3.2* 4.2 4.6  CL -- 84* 83* 84* 86* 87* 88* 93*  CO2 -- >45* >45* >45* 45* 41* 35* 34*  GLUCOSE -- 114* 168* 229* 179* 146* 242* 240*  BUN -- 79* 83* 86* 89* 84* 83* 74*  CREATININE -- 1.71* 1.82* 1.86* 1.91* 2.39* 2.77* 2.69*  ALBUMIN 3.0* -- 3.0* 2.9* 2.7* 2.7* 2.7* 2.9*  CALCIUM -- 9.9 9.9 9.7 9.3 8.8 8.7 8.6  PHOS 3.8 -- 4.1 3.6 3.0 4.3 5.6* 5.8*  AST -- -- -- -- -- -- -- --  ALT -- -- -- -- -- -- -- --   Liver Function Tests:  Lab 09/06/12 0525 09/05/12 0457 09/04/12 0454  AST -- -- --  ALT -- -- --  ALKPHOS -- -- --  BILITOT -- -- --  PROT -- -- --  ALBUMIN 3.0* 3.0* 2.9*   No results found for this basename: LIPASE:3,AMYLASE:3 in the last 168 hours No results found for this basename: AMMONIA:3 in the last 168 hours CBC:  Lab 09/06/12 0500 09/05/12 0457 09/04/12 0454 09/02/12 0401 08/31/12 0415  WBC 9.0 8.6 6.8 -- --  NEUTROABS -- -- -- -- --  HGB 8.9* 8.6* 8.6* -- --  HCT 30.2* 28.4* 28.2* -- --  MCV 88.8 87.9 87.6 84.6 87.4  PLT 232 221 171 -- --    Cardiac Enzymes: No results found for this basename: CKTOTAL:5,CKMB:5,CKMBINDEX:5,TROPONINI:5 in the last 168 hours CBG:  Lab 09/06/12 0728 09/05/12 2153 09/05/12 1600 09/05/12 1220 09/05/12 0743  GLUCAP 101* 178* 197* 207* 153*    Iron Studies: No results found for this basename: IRON,TIBC,TRANSFERRIN,FERRITIN in the last 72 hours Studies/Results: No results found.    Marland Kitchen acetaZOLAMIDE  500 mg Oral Q12H  . antiseptic oral rinse  15 mL Mouth Rinse q12n4p  . chlorhexidine  15 mL Mouth Rinse BID  . ciprofloxacin  400 mg Intravenous Q12H  . darbepoetin (ARANESP) injection - NON-DIALYSIS  100 mcg Subcutaneous Q Fri-1800  . diltiazem  90 mg Oral QID  . docusate sodium  100 mg Oral BID  . feeding supplement  30 mL Oral TID WC  . fluticasone  1 puff Inhalation BID  . insulin aspart  0-15 Units Subcutaneous TID WC  . insulin aspart  0-5 Units Subcutaneous QHS  . insulin glargine  15 Units Subcutaneous QHS  . isosorbide dinitrate  20 mg Oral TID  . levalbuterol  0.63 mg Nebulization BID  . lidocaine  2  patch Transdermal Q24H  . pantoprazole  40 mg Oral Daily  . polyethylene glycol  17 g Oral BID  . potassium chloride  40 mEq Oral Daily  . sodium chloride  10-40 mL Intracatheter Q12H  . sotalol  80 mg Oral BID  . [DISCONTINUED] acetaZOLAMIDE  500 mg Oral Q12H  . [DISCONTINUED] sotalol  80 mg Oral BID    BMET    Component Value Date/Time   NA 136 09/06/2012 0500   K 3.4* 09/06/2012 0500   CL 84* 09/06/2012 0500   CO2 >45* 09/06/2012 0500   GLUCOSE 114* 09/06/2012 0500   BUN 79* 09/06/2012 0500   CREATININE 1.71* 09/06/2012 0500   CALCIUM 9.9 09/06/2012 0500   GFRNONAA 26* 09/06/2012 0500   GFRAA 31* 09/06/2012 0500   CBC    Component Value Date/Time   WBC 9.0 09/06/2012 0500   RBC 3.40* 09/06/2012 0500   HGB 8.9* 09/06/2012 0500   HCT 30.2* 09/06/2012 0500   PLT 232 09/06/2012 0500   MCV 88.8 09/06/2012 0500   MCH 26.2 09/06/2012 0500   MCHC 29.5* 09/06/2012 0500   RDW 16.9*  09/06/2012 0500   LYMPHSABS 0.8 08/27/2012 1650   MONOABS 0.6 08/27/2012 1650   EOSABS 0.2 08/27/2012 1650   BASOSABS 0.0 08/27/2012 1650    Assessment/Plan:  1. AKI/CKD- markedly improved volume status. Now with contraction alkalosis and good diuresis without torsemide. Still put out 2.5L of urine without torsemide. Will hold torsemide again today and follow. Pt is a poor candidate for HD given advanced age, morbid obesity, multiple irreversible chronic medical problems, none of which would improve with RRT, nor her overal QOL. I discussed this with the patient and her daughter. All are in agreement not to attempt RRT. Will try to maximize volume status as well as pulm status and hopefully stabilize renal function prior to proceeding with surgery. Will discuss with Ortho. Need to avoid NSAIDs/COX-II Inhibitors/IV contrast material 2. Metabolic alkalosis- on Diamox 500mg  bid and hold torsemide.  Cont to follow. 3. Leg fractures- as above. Poor pain control at the present and she is very sensitive to narcotics. Would recommend tramadol 25mg  bid for now and consider Palliative care or anesthesia consult to help with balancing her resp depression due to narcotics and adequate pain control. I discussed case with Dr. Charlann Boxer on 08/31/12 and we agreed to hold off on surgery at this time given decompensated CHF/hypercapnea and AKI/CKD. Hopefully her overall condition will improve with volume removal. Markedly improved and ready for surgery tomorrow for repair. 4. Hypercapnic respiratory failure/Obesity-hypoventilation syndrome- improved. Cont with O2 via [place] per PCCM 5. Hypokalemia- replete po 6. CHF- mainly right sided. Markedly improved with torsemide. See above 7. ABLA/ACDz- follow H/H and transfuse prn. Will start ESA 8. A fib- rate-controlled 9. HTN- stable 10. Hyponatremia- due to CHF cont with diuresis 11. DM- poorly controlled, defer to primary svc 12. Dispo- poor QOL with multiple comorbidities. Will  likely return to SNF once stable vs LTCF if she improves. Agree with discussion and suggestions of Anders Simmonds, NP regarding limitations of medical therapy. Palliative care may be required if her clinical course was to worsen. 13. Will cont to hold torsemide today given significant diuresis without any dose yesterday.  14.   Chrsitopher Wik A

## 2012-09-06 NOTE — Progress Notes (Signed)
[facility] PROGRESS NOTE  Gina Ashley ZOX:[id number] DOB: 1927-01-29 DOA: 08/27/2012 PCP: Ernestine Conrad, MD  Assessment/Plan:  Acute on Chronic respiratory failure: Patient had some grogginess this morning likely due to narcotic effect in a setting of OSA. I will discontinue the oxycodone and continue Vicoden for pain. I have also negotiated with patient that she will wear her  BiPaP whenever sleeping whether day or night. Spoke with nurse about weaning oxygen back down to 2 liters.  Acute on CKD (chronic kidney disease): Patient back at baseline Cr. 1.7. However she now demonstrates a contraction alkalosis with a serum bicarbonate greater than 45.  Diamox has been started.     Multiple fractures of both lower extremities:  Pt presently immobilized and is medically stable for surgery. Pt still having significant amounts of pain. I will add oxycodone 5 mg to current regimen of pain medications and observe closely for sedating effects. ContinueLidoderm patches for added pain control. Surgery scheduled for tomorrow, 09/07/2012.  UTI:  Patient has a urinalysis consistent with urinary tract infection. Urine cultures however show no growth and Cipro has been discontinued.   DM:  Blood sugars much better controlled. Continue Lantus, SSI and HS coverage.   Hyponatremia:  Improved now within normal range there    OBSTRUCTIVE SLEEP APNEA; For now wil continue with BiPaP instead of CPAP   Pleural effusion: Pt had a transient increase in Oxygen requirement this morning. Will get CXR if pt fails to wean down to 2 L/min of Oxygen.   Constipation: Pt had last BM 2 days ago per daughter. Will give another dose of Magnesium citrate.  Hypokalemia: Improved. Will continue to replete as needed  Code Status: DNR Family Communication: Updated daughter at the bedside.  Disposition Plan: Likely SNf at the time of discharge.  Gerald Kuehl A.  [facility] Pager [phone] 8PM-8AM,  please contact night-coverage at [email or web address], password [email or web address] 09/06/2012, 9:33 AM  LOS: 10 days   Brief narrative: Pt reportedly from [facility] and had an unwitnessed fall around 4:00 am on day of admission. After the fall patient subsequently developed lower extremity discomfort and was subsequently taken to [facility] ED for further evaluation. History was obtained from daughters in the room and ED records as patient was given dilaudid for BL LE discomfort and subsequently was "sleepy" per family. In the room initially ortho technician was at bedside placing braces and patient had discomfort with manipulation of lower extremities while placing gauze. Since admission, her major issues that has led to a delay in surgical repair of fractures are respiratory failure and renal failure with fluid overload.   Consultants: Dr. Army Melia- Critical Care Dr. Colodonato-Nephrology Dr. Charlann Boxer- Orthopedic Surgery  Procedures:  None  Antibiotics:  None  HPI/Subjective: Pt still complaining of pain in bilateral lower extremities which are moderately controlled by pain medications here. Objective: Filed Vitals:   09/06/12 0200 09/06/12 0400 09/06/12 0600 09/06/12 0800  BP: 114/63 156/99 135/80 118/54  Pulse: 87 122 81 57  Temp:  97.7 F (36.5 C)  98 F (36.7 C)  TempSrc:  Axillary  Oral  Resp: 10 18 9 13   Height:      Weight:      SpO2: 97% 100% 100% 100%   Weight change:   Intake/Output Summary (Last 24 hours) at 09/06/12 0933 Last data filed at 09/06/12 0800  Gross per 24 hour  Intake 1062.5 ml  Output   2575 ml  Net -1512.5 ml    General: Alert, awake, oriented x3,  in no acute distress. Somewhat groggy this morning. HEENT: /AT PEERL, EOMI  Heart: Regular rate and rhythm, without murmurs, rubs, gallops, PMI non-displaced, no heaves or thrills on palpation.  Lungs: Mild bibasilar crackles noted. No increased vocal fremitus resonant to percussion  Abdomen: Soft, nontender, nondistended,  positive bowel sounds, no masses no hepatosplenomegaly noted. Musculoskeletal: No warm swelling or erythema around joints, no spinal tenderness noted.   Data Reviewed: Basic Metabolic Panel:  Lab 09/06/12 1610 09/06/12 0500 09/05/12 0457 09/04/12 0454 09/03/12 0455 09/02/12 0401  NA -- 136 136 137 135 132*  K -- 3.4* 3.9 3.4* 3.5 3.2*  CL -- 84* 83* 84* 86* 87*  CO2 -- >45* >45* >45* 45* 41*  GLUCOSE -- 114* 168* 229* 179* 146*  BUN -- 79* 83* 86* 89* 84*  CREATININE -- 1.71* 1.82* 1.86* 1.91* 2.39*  CALCIUM -- 9.9 9.9 9.7 9.3 8.8  MG -- -- -- -- -- --  PHOS 3.8 -- 4.1 3.6 3.0 4.3   Liver Function Tests:  Lab 09/06/12 0525 09/05/12 0457 09/04/12 0454 09/03/12 0455 09/02/12 0401  AST -- -- -- -- --  ALT -- -- -- -- --  ALKPHOS -- -- -- -- --  BILITOT -- -- -- -- --  PROT -- -- -- -- --  ALBUMIN 3.0* 3.0* 2.9* 2.7* 2.7*   No results found for this basename: LIPASE:5,AMYLASE:5 in the last 168 hours No results found for this basename: AMMONIA:5 in the last 168 hours CBC:  Lab 09/06/12 0500 09/05/12 0457 09/04/12 0454 09/02/12 0401 08/31/12 0415  WBC 9.0 8.6 6.8 7.6 9.4  NEUTROABS -- -- -- -- --  HGB 8.9* 8.6* 8.6* 8.4* 8.5*  HCT 30.2* 28.4* 28.2* 26.3* 27.7*  MCV 88.8 87.9 87.6 84.6 87.4  PLT 232 221 171 134* 141*   Cardiac Enzymes: No results found for this basename: CKTOTAL:5,CKMB:5,CKMBINDEX:5,TROPONINI:5 in the last 168 hours BNP (last 3 results) No results found for this basename: PROBNP:3 in the last 8760 hours CBG:  Lab 09/06/12 0728 09/05/12 2153 09/05/12 1600 09/05/12 1220 09/05/12 0743  GLUCAP 101* 178* 197* 207* 153*    Recent Results (from the past 240 hour(s))  URINE CULTURE     Status: Normal   Collection Time   08/27/12  4:04 PM      Component Value Range Status Comment   Specimen Description URINE, CATHETERIZED   Final    Special Requests NONE   Final    Culture  Setup Time 08/28/2012 01:23   Final    Colony Count NO GROWTH   Final    Culture NO  GROWTH   Final    Report Status 08/28/2012 FINAL   Final      Studies: Dg Chest 1 View  08/27/2012  *RADIOLOGY REPORT*  Clinical Data: Post fall, history of atrial fibrillation  CHEST - 1 VIEW  Comparison: 07/26/2012; 07/25/2012; 07/18/2012  Findings:  Grossly unchanged enlarged cardiac silhouette and mediastinal contours.  Interval development of a moderate-sized right-sided pleural effusion.  Pulmonary vasculature remains indistinct with cephalization of flow.  No definite left-sided pleural effusion. No definite pneumothorax.  Calcification overlying the left lower neck may be within the left lobe of the thyroid.  No definite acute osseous abnormalities.  IMPRESSION: 1.  Interval development of a moderate-sized right-sided pleural effusion with persistent findings of mild pulmonary edema.  Further evaluation with a PA and lateral chest radiograph may be obtained as clinically indicated. 2.  Possible calcification within the left lobe of the  thyroid. Further evaluation with thyroid ultrasound may be performed in the non emergent setting as clinically indicated   Original Report Authenticated By: Waynard Reeds, M.D.    Dg Femur Right  08/27/2012  *RADIOLOGY REPORT*  Clinical Data: Post fall, now with severe pain and tenderness involving the distal aspect of the femur  RIGHT FEMUR - 2 VIEW  Comparison: None.  Findings:  There is an oblique, minimally displaced, slightly impacted fracture of the distal femoral metaphysis.  There is no definitive extension of the fracture into the right total knee replacement hardware.  No definite evidence of hardware failure or loosening. No definite suprapatellar joint effusion, though evaluation degraded due to technique.  No radiopaque foreign body.  IMPRESSION: Oblique, minimally displaced, slightly impacted fracture of the distal femoral metaphysis without definite extension to involve the femoral component of the right total knee replacement.   Original Report  Authenticated By: Waynard Reeds, M.D.    Dg Tibia/fibula Left  08/27/2012  *RADIOLOGY REPORT*  Clinical Data: Post fall, now with leg pain  LEFT TIBIA AND FIBULA - 2 VIEW  Comparison: None.  Findings: There is an oblique, displaced fracture of the proximal tibial metaphysis with apparent extension to involve the tibial component of the left total knee hardware, which appears minimally angulated, apex ventral.  This is the adjacent soft tissue swelling.  No radiopaque foreign body.  Vascular calcifications.  IMPRESSION: Oblique, displaced fracture of the proximal tibial metaphysis with extension to involve the tibial component of the left total knee replacement hardware.   Original Report Authenticated By: Waynard Reeds, M.D.    Ct Head Wo Contrast  08/23/2012  *RADIOLOGY REPORT*  Clinical Data: Tremors and shaking  CT HEAD WITHOUT CONTRAST  Technique:  Contiguous axial images were obtained from the base of the skull through the vertex without contrast.  Comparison: 06/27/2012  Findings: Stable for mild age white matter hypodensity. There is prominence of the sulci and ventricles consistent with brain atrophy.  There is no evidence for acute brain infarct, hemorrhage or mass.  The paranasal sinuses and mastoid air cells are clear.  The skull appears intact.  IMPRESSION:  1.  No acute intracranial abnormalities.   Original Report Authenticated By: Rosealee Albee, M.D.    US Renal Port  08/30/2012  *RADIOLOGY REPORT*  Clinical Data: Acute renal failure  RENAL/URINARY TRACT ULTRASOUND COMPLETE  Comparison: CT 05/22/2012  Findings:  Right Kidney = 10.3 cm.  No hydronephrosis or mass.  A small simple cyst in upper pole.  Left kidney = Not well visualized. Bladder:  Foley catheter placed in the  IMPRESSION:  Normal right kidney.  Left kidney is poorly assessed.   Original Report Authenticated By: Genevive Bi, M.D.    Dg Chest Port 1 View  09/01/2012  *RADIOLOGY REPORT*  Clinical Data: Pleural  effusions.  PORTABLE CHEST - 1 VIEW  Comparison: Plain film chest 08/28/2012 and 08/30/2012.  Findings: Right worse than left pleural effusions and airspace disease do not appear changed compared to the 08/28/2012 study. There is cardiomegaly.  Right PICC remains in place.  IMPRESSION: No marked change in right worse than left effusions and airspace disease compared to the 08/28/2012 study.   Original Report Authenticated By: Bernadene Bell. Maricela Curet, M.D.    Dg Chest Port 1 View  08/30/2012  *RADIOLOGY REPORT*  Clinical Data: Fall pleural effusions  PORTABLE CHEST - 1 VIEW  Comparison: Chest radiograph 08/28/2012  Findings: Stable enlarged heart silhouette.  There is a right PICC line in place.  There is generalized central venous congestion and low lung volumes with small pleural effusions. Effusions are improved.  No pneumothorax.  IMPRESSION:  1.  Low lung volumes and pleural effusions with central venous congestion suggest congestive heart failure.  2.  Effusions are slightly improved.   Original Report Authenticated By: Genevive Bi, M.D.    Dg Chest Port 1 View  08/28/2012  *RADIOLOGY REPORT*  Clinical Data: Confirm the line placement.  Status post PICC placement.  PORTABLE CHEST - 1 VIEW  Comparison: 08/27/2012  Findings: The patient is slightly rotated to the left.  Right upper extremity PICC projects over the distal superior vena cava.  Its tip is approximately 3 cm inferior to the carina.  Cardiomegaly, pulmonary vascular congestion, and pulmonary edema persist.  There are bilateral pleural effusions, right greater than left.  No evidence of pneumothorax.  Coarse, chunky calcification in the left neck is unchanged compared to cervical spine radiographs of 2002.  Question if this could be a calcification within the left thyroid lobe.  IMPRESSION:  1. The distal tip of the right upper extremity PICC projects over the distal superior vena cava, in satisfactory position. 2.  Congestive heart failure  pattern with bilateral pleural effusions, right greater than left.   Original Report Authenticated By: Britta Mccreedy, M.D.     Scheduled Meds:    . acetaZOLAMIDE  500 mg Oral Q12H  . antiseptic oral rinse  15 mL Mouth Rinse q12n4p  . chlorhexidine  15 mL Mouth Rinse BID  . darbepoetin (ARANESP) injection - NON-DIALYSIS  100 mcg Subcutaneous Q Fri-1800  . diltiazem  90 mg Oral QID  . docusate sodium  100 mg Oral BID  . feeding supplement  30 mL Oral TID WC  . fluticasone  1 puff Inhalation BID  . insulin aspart  0-15 Units Subcutaneous TID WC  . insulin aspart  0-5 Units Subcutaneous QHS  . insulin glargine  15 Units Subcutaneous QHS  . isosorbide dinitrate  20 mg Oral TID  . levalbuterol  0.63 mg Nebulization BID  . lidocaine  2 patch Transdermal Q24H  . pantoprazole  40 mg Oral Daily  . polyethylene glycol  17 g Oral BID  . potassium chloride  40 mEq Oral Daily  . sodium chloride  10-40 mL Intracatheter Q12H  . sotalol  80 mg Oral BID  . [DISCONTINUED] acetaZOLAMIDE  500 mg Oral Q12H  . [DISCONTINUED] ciprofloxacin  400 mg Intravenous Q12H  . [DISCONTINUED] sotalol  80 mg Oral BID   Continuous Infusions:    . heparin 1,450 Units/hr (09/06/12 0609)  . [DISCONTINUED] heparin 1,300 Units/hr (09/05/12 1230)    Principal Problem:  *Multiple fractures of both lower extremities Active Problems:  DM  OBSTRUCTIVE SLEEP APNEA  GERD  PAF (paroxysmal atrial fibrillation), was in SR 06/2012   HTN (hypertension)  COPD (chronic obstructive pulmonary disease)  Hyponatremia  CKD (chronic kidney disease)  Pleural effusion  Hypercapnemia  Acute respiratory failure  Chronic diastolic heart failure  Hypokalemia  Alkalosis, metabolic  Pain  UTI (lower urinary tract infection)

## 2012-09-06 NOTE — Progress Notes (Signed)
ANTICOAGULATION CONSULT NOTE - follow up Consult  Pharmacy Consult for IV Heparin  Indication: A.Fib  Allergies  Allergen Reactions  . Morphine Sulfate Other (See Comments)    REACTION: change in personality  . Remeron (Mirtazapine) Other (See Comments)    Altered mental status, lethargy  . Tuna (Fish Allergy)   . Penicillins Rash    Patient Measurements: Height: 5\' 3"  (160 cm) Weight: 242 lb 8.1 oz (110 kg) IBW/kg (Calculated) : 52.4  TBW = 112.9 kg Heparin dosing weight = 80 kg  Vital Signs: Temp: 98 F (36.7 C) (11/03 0800) Temp src: Oral (11/03 0800) BP: 135/80 mmHg (11/03 0600) Pulse Rate: 81  (11/03 0600)  Labs:  Basename 09/06/12 0525 09/06/12 0500 09/05/12 1800 09/05/12 0457 09/04/12 0454  HGB -- 8.9* -- 8.6* --  HCT -- 30.2* -- 28.4* 28.2*  PLT -- 232 -- 221 171  APTT -- -- -- -- --  LABPROT -- -- -- -- --  INR -- -- -- -- --  HEPARINUNFRC 0.34 -- 0.29* 0.22* --  CREATININE -- 1.71* -- 1.82* 1.86*  CKTOTAL -- -- -- -- --  CKMB -- -- -- -- --  TROPONINI -- -- -- -- --    Estimated Creatinine Clearance: 29.7 ml/min (by C-G formula based on Cr of 1.71).   Medical History: Past Medical History  Diagnosis Date  . Diabetes mellitus     x 15 yrs  . GERD (gastroesophageal reflux disease)   . Back pain, chronic   . Pain in shoulder   . Pain in ankle   . Hiatal hernia   . HTN (hypertension)     all her life  . CKD (chronic kidney disease), stage III     GFR: 38  . Atrial fibrillation     since 1996  . Sleep apnea   . Fall 3 weeks ago    was [facility] at [facility]  . OSA on CPAP   . Depression   . PONV (postoperative nausea and vomiting)     difficult to wake up  . Chronic diastolic heart failure 08/31/2012    Medications:  Scheduled:     . acetaZOLAMIDE  500 mg Oral Q12H  . antiseptic oral rinse  15 mL Mouth Rinse q12n4p  . chlorhexidine  15 mL Mouth Rinse BID  . ciprofloxacin  400 mg Intravenous Q12H  . darbepoetin (ARANESP)  injection - NON-DIALYSIS  100 mcg Subcutaneous Q Fri-1800  . diltiazem  90 mg Oral QID  . docusate sodium  100 mg Oral BID  . feeding supplement  30 mL Oral TID WC  . fluticasone  1 puff Inhalation BID  . insulin aspart  0-15 Units Subcutaneous TID WC  . insulin aspart  0-5 Units Subcutaneous QHS  . insulin glargine  15 Units Subcutaneous QHS  . isosorbide dinitrate  20 mg Oral TID  . levalbuterol  0.63 mg Nebulization BID  . lidocaine  2 patch Transdermal Q24H  . pantoprazole  40 mg Oral Daily  . polyethylene glycol  17 g Oral BID  . potassium chloride  40 mEq Oral Daily  . sodium chloride  10-40 mL Intracatheter Q12H  . sotalol  80 mg Oral BID  . [DISCONTINUED] acetaZOLAMIDE  500 mg Oral Q12H  . [DISCONTINUED] sotalol  80 mg Oral BID  . [DISCONTINUED] torsemide  50 mg Oral Daily   Infusions:     . heparin 1,450 Units/hr (09/06/12 0609)  . [DISCONTINUED] heparin 1,300 Units/hr (09/05/12 1230)  PRN: bisacodyl, diphenhydrAMINE-zinc acetate, feeding supplement, HYDROcodone-acetaminophen, levalbuterol, metoprolol, ondansetron (ZOFRAN) IV, ondansetron, oxyCODONE, sodium chloride, sodium phosphate, traMADol  Assessment:  Gina Ashley with a history of A.fib on chronic coumadin.  Admitted on 10/24 with b/l LE fractures s/p fall. Surgical intervention tentatively planned for 11/4.  Plan is to hold coumadin and bridge with IV heparin prior to surgery  Chronic renal insufficiency noted, Scr improving  H/H low but stable.  No bleeding reported  HL 0.34 on 1450 units/hour - within therapeutic range   Goal of Therapy:  Heparin level 0.3-0.7 units/ml Monitor platelets by anticoagulation protocol: Yes   Plan:  Continue heparin to 1450 units/hr (14.49ml/hr) F/U AM heparin level F/U Peri-op plan for anticoagulation tomorrow Daily HL and CBC  Gwen Her PharmD  [phone] 09/06/2012 8:43 AM

## 2012-09-07 ENCOUNTER — Encounter (HOSPITAL_COMMUNITY)
Admission: EM | Disposition: A | Discharge status: Skilled Nursing Facility | Payer: Self-pay | Source: Home / Self Care | Attending: Internal Medicine

## 2012-09-07 LAB — GLUCOSE, CAPILLARY
Glucose-Capillary: 236 mg/dL — ABNORMAL HIGH (ref 70–99)
Glucose-Capillary: 197 mg/dL — ABNORMAL HIGH (ref 70–99)

## 2012-09-07 LAB — RENAL FUNCTION PANEL
BUN: 73 mg/dL — ABNORMAL HIGH (ref 6–23)
CO2: >45 mEq/L (ref 19–32)
Calcium: 9.6 mg/dL (ref 8.4–10.5)
Creatinine, Ser: 1.58 mg/dL — ABNORMAL HIGH (ref 0.50–1.10)
Glucose, Bld: 232 mg/dL — ABNORMAL HIGH (ref 70–99)

## 2012-09-07 LAB — CBC
Hemoglobin: 7.9 g/dL — ABNORMAL LOW (ref 12.0–15.0)
MCH: 26.6 pg (ref 26.0–34.0)
MCV: 88.2 fL (ref 78.0–100.0)
RBC: 2.97 MIL/uL — ABNORMAL LOW (ref 3.87–5.11)

## 2012-09-07 LAB — PREPARE RBC (CROSSMATCH)

## 2012-09-07 SURGERY — OPEN REDUCTION INTERNAL FIXATION (ORIF) PERIPROSTHETIC FRACTURE
Anesthesia: Choice | Laterality: Right

## 2012-09-07 MED ORDER — SODIUM CHLORIDE 0.9 % IV SOLN
INTRAVENOUS | Status: DC
  Administered 2012-09-07: 20 mL/h via INTRAVENOUS
  Administered 2012-09-08 – 2012-09-10 (×4): via INTRAVENOUS
  Administered 2012-09-13: 20 mL/h via INTRAVENOUS

## 2012-09-07 MED ORDER — HYDROCODONE-ACETAMINOPHEN 5-325 MG PO TABS
1.0000 | ORAL_TABLET | Freq: Once | ORAL | Status: AC
  Administered 2012-09-07: 1 via ORAL
  Filled 2012-09-07: qty 1

## 2012-09-07 MED ORDER — HEPARIN (PORCINE) IN NACL 100-0.45 UNIT/ML-% IJ SOLN
1450.0000 [IU]/h | INTRAMUSCULAR | Status: DC
  Administered 2012-09-07: 1450 [IU]/h via INTRAVENOUS
  Filled 2012-09-07 (×8): qty 250

## 2012-09-07 NOTE — Progress Notes (Signed)
ANTICOAGULATION CONSULT NOTE - follow up  Pharmacy Consult for IV Heparin  Indication: A.Fib  Allergies  Allergen Reactions  . Morphine Sulfate Other (See Comments)    REACTION: change in personality  . Remeron (Mirtazapine) Other (See Comments)    Altered mental status, lethargy  . Tuna (Fish Allergy)   . Penicillins Rash    Patient Measurements: Height: 5\' 3"  (160 cm) Weight: 242 lb 8.1 oz (110 kg) IBW/kg (Calculated) : 52.4  TBW = 112.9 kg Heparin dosing weight = 80 kg  Vital Signs: Temp: 98.1 F (36.7 C) (11/04 0003) Temp src: Oral (11/04 0003) BP: 128/58 mmHg (11/04 0400) Pulse Rate: 71  (11/04 0400)  Labs:  Basename 09/07/12 0540 09/06/12 0525 09/06/12 0500 09/05/12 1800 09/05/12 0457  HGB 7.9* -- 8.9* -- --  HCT 26.2* -- 30.2* -- 28.4*  PLT 231 -- 232 -- 221  APTT -- -- -- -- --  LABPROT 14.8 -- -- -- --  INR 1.18 -- -- -- --  HEPARINUNFRC 0.46 0.34 -- 0.29* --  CREATININE -- -- 1.71* -- 1.82*  CKTOTAL -- -- -- -- --  CKMB -- -- -- -- --  TROPONINI -- -- -- -- --    Estimated Creatinine Clearance: 29.7 ml/min (by C-G formula based on Cr of 1.71).   Medical History: Past Medical History  Diagnosis Date  . Diabetes mellitus     x 15 yrs  . GERD (gastroesophageal reflux disease)   . Back pain, chronic   . Pain in shoulder   . Pain in ankle   . Hiatal hernia   . HTN (hypertension)     all her life  . CKD (chronic kidney disease), stage III     GFR: 38  . Atrial fibrillation     since 1996  . Sleep apnea   . Fall 3 weeks ago    was evaluated at [facility]  . OSA on CPAP   . Depression   . PONV (postoperative nausea and vomiting)     difficult to wake up  . Chronic diastolic heart failure 08/31/2012    Medications:  Scheduled:     . acetaZOLAMIDE  500 mg Oral Q12H  . antiseptic oral rinse  15 mL Mouth Rinse q12n4p  . chlorhexidine  15 mL Mouth Rinse BID  . darbepoetin (ARANESP) injection - NON-DIALYSIS  100 mcg Subcutaneous Q Fri-1800   . diltiazem  90 mg Oral QID  . docusate sodium  100 mg Oral BID  . feeding supplement  30 mL Oral TID WC  . fluticasone  1 puff Inhalation BID  . insulin aspart  0-15 Units Subcutaneous TID WC  . insulin aspart  0-5 Units Subcutaneous QHS  . insulin glargine  15 Units Subcutaneous QHS  . isosorbide dinitrate  20 mg Oral TID  . levalbuterol  0.63 mg Nebulization BID  . lidocaine  2 patch Transdermal Q24H  . [COMPLETED] magnesium citrate  1 Bottle Oral Once  . pantoprazole  40 mg Oral Daily  . polyethylene glycol  17 g Oral BID  . [COMPLETED] potassium chloride  40 mEq Oral Daily  . sodium chloride  10-40 mL Intracatheter Q12H  . sotalol  80 mg Oral BID  . [DISCONTINUED] ciprofloxacin  400 mg Intravenous Q12H   Infusions:     . heparin 1,450 Units/hr (09/07/12 0200)   PRN: bisacodyl, diphenhydrAMINE-zinc acetate, feeding supplement, HYDROcodone-acetaminophen, levalbuterol, metoprolol, ondansetron (ZOFRAN) IV, ondansetron, sodium chloride, traMADol, [DISCONTINUED] oxyCODONE, [DISCONTINUED] sodium phosphate  Assessment:  88 YOF with a history of A.fib on chronic Coumadin.  Admitted on 10/24 with b/l LE fractures s/p fall. Surgical intervention tentatively planned for 11/4.  Plan is to hold coumadin and bridge with IV heparin prior to surgery  Chronic renal insufficiency noted, Scr improving  H/H low but stable.  No bleeding reported  HL 0.46 on 1450 units/hour - within therapeutic range   Goal of Therapy:  Heparin level 0.3-0.7 units/ml Monitor platelets by anticoagulation protocol: Yes   Plan:   Continue heparin to 1450 units/hr (14.20ml/hr)  F/U Peri-op plan for anticoagulation  Daily HL and CBC  Charolotte Eke, PharmD, pager [phone]. 09/07/2012,6:38 AM.

## 2012-09-07 NOTE — Progress Notes (Signed)
[facility] PROGRESS NOTE  Gina Ashley ZHY:[id number] DOB: [birth year] DOA: 08/27/2012 PCP: Ernestine Conrad, MD  Assessment/Plan:  Acute on Chronic respiratory failure:  Patient presented with acute hypercarbic respiratory failure which is felt to be secondary to narcotic effect superimposed on underlying obstructive sleep apnea and chronic renal failure. She has been on BiPAP and tolerating it well. As a result of her diuresis she's had some metabolic alkalosis and is currently on Diamox. The patient is to continue on BiPAP each bedtime and whenever sleeping. We try as much as possible to minimize her narcotic use although her pain is very poorly controlled.  Acute on CKD (chronic kidney disease): Patient back at baseline Cr. However she now demonstrates a contraction alkalosis with a serum bicarbonate greater than 45.  Diamox has been started. I will defer to nephrology for further recommendations.    Multiple fractures of both lower extremities:  Pt presently immobilized and is medically stable for surgery. I've spoken with Dr. Charlann Boxer and the patient is scheduled for surgery this afternoon.  UTI:  Patient has a urinalysis consistent with urinary tract infection. Urine cultures however show no growth and Cipro has been discontinued.   DM:  Blood sugars much better controlled. Continue Lantus, SSI and HS coverage.   Hyponatremia:  Improved now within normal range there    OBSTRUCTIVE SLEEP APNEA; For now wil continue with BiPaP instead of CPAP   Pleural effusion: Pt had a transient increase in Oxygen requirement this morning. Will get CXR if pt fails to wean down to 2 L/min of Oxygen.   Constipation: Pt has had multiple bowel movements since magnesium citrate given.  Hypokalemia: Improved. Will continue to replete as needed  Code Status: DNR Family Communication: Updated daughter at the bedside.  Disposition Plan: Likely SNf at the time of discharge.  Jaidev Sanger  A.  [facility] Pager [phone] 8PM-8AM, please contact night-coverage at [email or web address], password [email or web address] 09/07/2012, 6:18 PM  LOS: 11 days   Brief narrative: Pt reportedly from [facility] and had an unwitnessed fall around 4:00 am on day of admission. After the fall patient subsequently developed lower extremity discomfort and was subsequently taken to [facility] ED for further evaluation. History was obtained from daughters in the room and ED records as patient was given dilaudid for BL LE discomfort and subsequently was "sleepy" per family. In the room initially ortho technician was at bedside placing braces and patient had discomfort with manipulation of lower extremities while placing gauze. Since admission, her major issues that has led to a delay in surgical repair of fractures are respiratory failure and renal failure with fluid overload.   Consultants: Dr. Army Melia- Critical Care Dr. Colodonato-Nephrology Dr. Charlann Boxer- Orthopedic Surgery  Procedures:  None  Antibiotics:  None  HPI/Subjective: Pt states that she is just wanting to get her surgery over with.  Objective: Filed Vitals:   09/07/12 1357 09/07/12 1400 09/07/12 1600 09/07/12 1700  BP: 128/69 124/62 125/70   Pulse:  81 76 89  Temp:   98 F (36.7 C)   TempSrc:   Oral   Resp:  14 11 7   Height:      Weight:      SpO2:  100% 99% 100%   Weight change:   Intake/Output Summary (Last 24 hours) at 09/07/12 1818 Last data filed at 09/07/12 1700  Gross per 24 hour  Intake  777.5 ml  Output   2225 ml  Net -1447.5 ml    General: Alert, awake, oriented x3, in  no acute distress. Somewhat groggy this morning. HEENT: [place]/AT PEERL, EOMI  Heart: Regular rate and rhythm, without murmurs, rubs, gallops, PMI non-displaced, no heaves or thrills on palpation.  Lungs: Mild bibasilar crackles noted. No increased vocal fremitus resonant to percussion  Abdomen: Soft, nontender, nondistended, positive bowel sounds, no masses no  hepatosplenomegaly noted. Musculoskeletal: No warm swelling or erythema around joints, no spinal tenderness noted.   Data Reviewed: Basic Metabolic Panel:  Lab 09/07/12 0102 09/06/12 0525 09/06/12 0500 09/05/12 0457 09/04/12 0454 09/03/12 0455  NA 135 -- 136 136 137 135  K 3.6 -- 3.4* 3.9 3.4* 3.5  CL 86* -- 84* 83* 84* 86*  CO2 >45* -- >45* >45* >45* 45*  GLUCOSE 232* -- 114* 168* 229* 179*  BUN 73* -- 79* 83* 86* 89*  CREATININE 1.58* -- 1.71* 1.82* 1.86* 1.91*  CALCIUM 9.6 -- 9.9 9.9 9.7 9.3  MG -- -- -- -- -- --  PHOS 3.4 3.8 -- 4.1 3.6 3.0   Liver Function Tests:  Lab 09/07/12 0540 09/06/12 0525 09/05/12 0457 09/04/12 0454 09/03/12 0455  AST -- -- -- -- --  ALT -- -- -- -- --  ALKPHOS -- -- -- -- --  BILITOT -- -- -- -- --  PROT -- -- -- -- --  ALBUMIN 2.9* 3.0* 3.0* 2.9* 2.7*   No results found for this basename: LIPASE:5,AMYLASE:5 in the last 168 hours No results found for this basename: AMMONIA:5 in the last 168 hours CBC:  Lab 09/07/12 0540 09/06/12 0500 09/05/12 0457 09/04/12 0454 09/02/12 0401  WBC 8.0 9.0 8.6 6.8 7.6  NEUTROABS -- -- -- -- --  HGB 7.9* 8.9* 8.6* 8.6* 8.4*  HCT 26.2* 30.2* 28.4* 28.2* 26.3*  MCV 88.2 88.8 87.9 87.6 84.6  PLT 231 232 221 171 134*   Cardiac Enzymes: No results found for this basename: CKTOTAL:5,CKMB:5,CKMBINDEX:5,TROPONINI:5 in the last 168 hours BNP (last 3 results) No results found for this basename: PROBNP:3 in the last 8760 hours CBG:  Lab 09/07/12 1637 09/07/12 1134 09/07/12 0753 09/07/12 0347 09/06/12 2108  GLUCAP 99 158* 197* 236* 222*    Recent Results (from the past 240 hour(s))  URINE CULTURE     Status: Normal (Preliminary result)   Collection Time   09/04/12 10:55 PM      Component Value Range Status Comment   Specimen Description URINE, CATHETERIZED   Final    Special Requests NONE   Final    Culture  Setup Time 09/05/2012 07:07   Final    Colony Count >=100,000 COLONIES/ML   Final    Culture     Final     Value: ESCHERICHIA COLI     GRAM NEGATIVE RODS   Report Status PENDING   Incomplete      Studies: Dg Chest 1 View  08/27/2012  *RADIOLOGY REPORT*  Clinical Data: Post fall, history of atrial fibrillation  CHEST - 1 VIEW  Comparison: 07/26/2012; 07/25/2012; 07/18/2012  Findings:  Grossly unchanged enlarged cardiac silhouette and mediastinal contours.  Interval development of a moderate-sized right-sided pleural effusion.  Pulmonary vasculature remains indistinct with cephalization of flow.  No definite left-sided pleural effusion. No definite pneumothorax.  Calcification overlying the left lower neck may be within the left lobe of the thyroid.  No definite acute osseous abnormalities.  IMPRESSION: 1.  Interval development of a moderate-sized right-sided pleural effusion with persistent findings of mild pulmonary edema.  Further evaluation with a PA and lateral chest radiograph may be obtained as clinically  indicated. 2.  Possible calcification within the left lobe of the thyroid. Further evaluation with thyroid ultrasound may be performed in the non emergent setting as clinically indicated   Original Report Authenticated By: Waynard Reeds, M.D.    Dg Femur Right  08/27/2012  *RADIOLOGY REPORT*  Clinical Data: Post fall, now with severe pain and tenderness involving the distal aspect of the femur  RIGHT FEMUR - 2 VIEW  Comparison: None.  Findings:  There is an oblique, minimally displaced, slightly impacted fracture of the distal femoral metaphysis.  There is no definitive extension of the fracture into the right total knee replacement hardware.  No definite evidence of hardware failure or loosening. No definite suprapatellar joint effusion, though evaluation degraded due to technique.  No radiopaque foreign body.  IMPRESSION: Oblique, minimally displaced, slightly impacted fracture of the distal femoral metaphysis without definite extension to involve the femoral component of the right total knee  replacement.   Original Report Authenticated By: Waynard Reeds, M.D.    Dg Tibia/fibula Left  08/27/2012  *RADIOLOGY REPORT*  Clinical Data: Post fall, now with leg pain  LEFT TIBIA AND FIBULA - 2 VIEW  Comparison: None.  Findings: There is an oblique, displaced fracture of the proximal tibial metaphysis with apparent extension to involve the tibial component of the left total knee hardware, which appears minimally angulated, apex ventral.  This is the adjacent soft tissue swelling.  No radiopaque foreign body.  Vascular calcifications.  IMPRESSION: Oblique, displaced fracture of the proximal tibial metaphysis with extension to involve the tibial component of the left total knee replacement hardware.   Original Report Authenticated By: Waynard Reeds, M.D.    Ct Head Wo Contrast  08/23/2012  *RADIOLOGY REPORT*  Clinical Data: Tremors and shaking  CT HEAD WITHOUT CONTRAST  Technique:  Contiguous axial images were obtained from the base of the skull through the vertex without contrast.  Comparison: 06/27/2012  Findings: Stable for mild age white matter hypodensity. There is prominence of the sulci and ventricles consistent with brain atrophy.  There is no evidence for acute brain infarct, hemorrhage or mass.  The paranasal sinuses and mastoid air cells are clear.  The skull appears intact.  IMPRESSION:  1.  No acute intracranial abnormalities.   Original Report Authenticated By: Rosealee Albee, M.D.    US Renal Port  08/30/2012  *RADIOLOGY REPORT*  Clinical Data: Acute renal failure  RENAL/URINARY TRACT ULTRASOUND COMPLETE  Comparison: CT 05/22/2012  Findings:  Right Kidney = 10.3 cm.  No hydronephrosis or mass.  A small simple cyst in upper pole.  Left kidney = Not well visualized. Bladder:  Foley catheter placed in the  IMPRESSION:  Normal right kidney.  Left kidney is poorly assessed.   Original Report Authenticated By: Genevive Bi, M.D.    Dg Chest Port 1 View  09/01/2012  *RADIOLOGY  REPORT*  Clinical Data: Pleural effusions.  PORTABLE CHEST - 1 VIEW  Comparison: Plain film chest 08/28/2012 and 08/30/2012.  Findings: Right worse than left pleural effusions and airspace disease do not appear changed compared to the 08/28/2012 study. There is cardiomegaly.  Right PICC remains in place.  IMPRESSION: No marked change in right worse than left effusions and airspace disease compared to the 08/28/2012 study.   Original Report Authenticated By: Bernadene Bell. Maricela Curet, M.D.    Dg Chest Port 1 View  08/30/2012  *RADIOLOGY REPORT*  Clinical Data: Fall pleural effusions  PORTABLE CHEST - 1 VIEW  Comparison: Chest radiograph 08/28/2012  Findings: Stable enlarged heart silhouette.  There is a right PICC line in place.  There is generalized central venous congestion and low lung volumes with small pleural effusions. Effusions are improved.  No pneumothorax.  IMPRESSION:  1.  Low lung volumes and pleural effusions with central venous congestion suggest congestive heart failure.  2.  Effusions are slightly improved.   Original Report Authenticated By: Genevive Bi, M.D.    Dg Chest Port 1 View  08/28/2012  *RADIOLOGY REPORT*  Clinical Data: Confirm the line placement.  Status post PICC placement.  PORTABLE CHEST - 1 VIEW  Comparison: 08/27/2012  Findings: The patient is slightly rotated to the left.  Right upper extremity PICC projects over the distal superior vena cava.  Its tip is approximately 3 cm inferior to the carina.  Cardiomegaly, pulmonary vascular congestion, and pulmonary edema persist.  There are bilateral pleural effusions, right greater than left.  No evidence of pneumothorax.  Coarse, chunky calcification in the left neck is unchanged compared to cervical spine radiographs of 2002.  Question if this could be a calcification within the left thyroid lobe.  IMPRESSION:  1. The distal tip of the right upper extremity PICC projects over the distal superior vena cava, in satisfactory position.  2.  Congestive heart failure pattern with bilateral pleural effusions, right greater than left.   Original Report Authenticated By: Britta Mccreedy, M.D.     Scheduled Meds:    . acetaZOLAMIDE  500 mg Oral Q12H  . antiseptic oral rinse  15 mL Mouth Rinse q12n4p  . chlorhexidine  15 mL Mouth Rinse BID  . darbepoetin (ARANESP) injection - NON-DIALYSIS  100 mcg Subcutaneous Q Fri-1800  . diltiazem  90 mg Oral QID  . docusate sodium  100 mg Oral BID  . feeding supplement  30 mL Oral TID WC  . fluticasone  1 puff Inhalation BID  . [COMPLETED] HYDROcodone-acetaminophen  1 tablet Oral Once  . insulin aspart  0-15 Units Subcutaneous TID WC  . insulin aspart  0-5 Units Subcutaneous QHS  . insulin glargine  15 Units Subcutaneous QHS  . isosorbide dinitrate  20 mg Oral TID  . levalbuterol  0.63 mg Nebulization BID  . lidocaine  2 patch Transdermal Q24H  . pantoprazole  40 mg Oral Daily  . polyethylene glycol  17 g Oral BID  . sodium chloride  10-40 mL Intracatheter Q12H  . sotalol  80 mg Oral BID   Continuous Infusions:    . sodium chloride 20 mL/hr (09/07/12 1000)  . [DISCONTINUED] heparin 1,450 Units/hr (09/07/12 0200)    Principal Problem:  *Multiple fractures of both lower extremities Active Problems:  DM  OBSTRUCTIVE SLEEP APNEA  GERD  PAF (paroxysmal atrial fibrillation), was in SR 06/2012   HTN (hypertension)  COPD (chronic obstructive pulmonary disease)  Hyponatremia  CKD (chronic kidney disease)  Pleural effusion  Hypercapnemia  Acute respiratory failure  Chronic diastolic heart failure  Hypokalemia  Alkalosis, metabolic  Pain  UTI (lower urinary tract infection)

## 2012-09-07 NOTE — Anesthesia Preprocedure Evaluation (Addendum)
Anesthesia Evaluation  Patient identified by MRN, date of birth, ID band Patient awake    Reviewed: Allergy & Precautions, H&P , NPO status , Patient's Chart, lab work & pertinent test results  History of Anesthesia Complications (+) PONV  Airway Mallampati: II TM Distance: >3 FB Neck ROM: Full    Dental  (+) Edentulous Upper and Dental Advisory Given   Pulmonary neg pulmonary ROS, sleep apnea and Continuous Positive Airway Pressure Ventilation , COPD Bilateral pleural effusions. Pt has been on biPap today. ABG pco2 70  CXR report reviewed.   Dr. Kavin Leech note reviewed.   + decreased breath sounds      Cardiovascular hypertension, Pt. on medications +CHF negative cardio ROS  + dysrhythmias Atrial Fibrillation Rhythm:Irregular Rate:Normal     Neuro/Psych PSYCHIATRIC DISORDERS Depression  Neuromuscular disease    GI/Hepatic negative GI ROS, Neg liver ROS, hiatal hernia, GERD-  ,  Endo/Other  diabetes, Insulin DependentMorbid obesity  Renal/GU ARF and CRFRenal disease   CRF stage 4, not a HD candidate.  Now on diamox.  negative genitourinary   Musculoskeletal negative musculoskeletal ROS (+)   Abdominal (+) + obese,   Peds negative pediatric ROS (+)  Hematology negative hematology ROS (+)   Anesthesia Other Findings   Reproductive/Obstetrics negative OB ROS                        Anesthesia Physical Anesthesia Plan  ASA: IV  Anesthesia Plan: General   Post-op Pain Management:    Induction: Intravenous  Airway Management Planned: Oral ETT  Additional Equipment:   Intra-op Plan:   Post-operative Plan: Post-operative intubation/ventilation  Informed Consent: I have reviewed the patients History and Physical, chart, labs and discussed the procedure including the risks, benefits and alternatives for the proposed anesthesia with the patient or authorized representative who has  indicated his/her understanding and acceptance.   Dental advisory given  Plan Discussed with: CRNA  Anesthesia Plan Comments: (Plan is to keep intubated postoperatively. She requires BiPAP, retains CO2 and will have increased narcotic requirements.)        Anesthesia Quick Evaluation

## 2012-09-07 NOTE — Progress Notes (Signed)
Pharmacy: Heparin bridge for [facility]  Surgery cancelled for today, resume Heparin.  Will infuse Heparin at 1450 units/hr, no bolus, and level in am.  Goal range 0.3-0.7 units/hr.  Otho Bellows PharmD Pager [phone] 09/07/2012 10:27 PM

## 2012-09-07 NOTE — Progress Notes (Addendum)
Name: Gina Ashley MRN: [id number] DOB: 01-Dec-1926    LOS: 11  Referring Provider:  Rito Ehrlich Reason for Referral:  AMS  PULMONARY / CRITICAL CARE MEDICINE  Brief patient description:  This is a 78 year old female w/ multiple co-morbids including: CRF stage IV, AF on coumadin, OSA (wears CPAP at HS). Recently transferred to SNF after ~ 2 week stay at [facility] for acute on chronic renal failure. Had unwitnessed fall on 10/24 resulting in BLE fractures. Admitted by medical service rx has included pain management, IV lasix gtt for volume overload and ortho consult. PCCM asked to see on 10/28 for AMS and concern about hypercarbia.   Subjective: Lethargic after pain control this am Wore BiPAP 4 hours last night Going to OR this pm   Current Status: Stable  Vital Signs: Temp:  [98.1 F (36.7 C)-98.3 F (36.8 C)] 98.2 F (36.8 C) (11/04 0800) Pulse Rate:  [45-122] 59  (11/04 0600) Resp:  [9-15] 9  (11/04 0600) BP: (96-139)/(47-75) 96/47 mmHg (11/04 0600) SpO2:  [95 %-100 %] 97 % (11/04 0600) FiO2 (%):  [2 %-40 %] 40 % (11/04 0300) Weight:  [105.2 kg (231 lb 14.8 oz)] 105.2 kg (231 lb 14.8 oz) (11/04 0500) 2 liters n/c Physical Examination: General:  Chronically ill appearing 78 year old female. More awake.  Neuro:  Awake, oriented X 3. Reports intermittent pain but No pain now. HEENT:  + JVD, MM dry Cardiovascular:  Regular irregular  Lungs:  Decreased bases.  Clear upper lobes Abdomen:  + bowel sounds, soft nontender Musculoskeletal:  Lower extremity pain bilatterally pain Skin:  Slight edema lower extremities  Principal Problem:  *Multiple fractures of both lower extremities Active Problems:  DM  OBSTRUCTIVE SLEEP APNEA  GERD  PAF (paroxysmal atrial fibrillation), was in SR 06/2012   HTN (hypertension)  COPD (chronic obstructive pulmonary disease)  Hyponatremia  CKD (chronic kidney disease)  Pleural effusion  Hypercapnemia  Acute respiratory failure  Chronic diastolic  heart failure  Hypokalemia  Alkalosis, metabolic  Pain  UTI (lower urinary tract infection)   ASSESSMENT AND PLAN  PULMONARY  Lab 09/01/12 0524 08/31/12 0938  PHART 7.318* 7.312*  PCO2ART 70.0* 69.6*  PO2ART 86.5 89.7  HCO3 34.9* 34.2*  O2SAT 97.5 97.5   Ventilator Settings: Vent Mode:  [-]  FiO2 (%):  [2 %-40 %] 40 % CXR:  None 11/4 ETT:    A:   Acute Hypercarbic Respiratory Failure: in setting of narcotic effect superimposed on underlying OSA and CRF; needs long term BIPAP, not CPAP Bilateral atelectasis Pulmonary edema/volume excess +/- element of effusion P:   Cont BIPAP qHS  Minimize narcotics Agree with short term diamox (2 -3 days) to address contraction alkalosis >> will make chronically elevated pCO2 easier to manage flovent + xopenex CXR 11/5 to help guide restart diuiresis   CARDIOVASCULAR No results found for this basename: TROPONINI:5,LATICACIDVEN:5, O2SATVEN:5,PROBNP:5 in the last 168 hours ECG:  aflutter Lines: ECHO 10/21: estimated ejection fraction was in the range of 55% to 60%. Wall motion was normal; there were no regional wall motion abnormalities. The study is not technically sufficient to allow evaluation of LV diastolic function. A:  AFib/Flutter HTN Pulmonary edema  Probable secondary PAH Both fib and HTN controlled.  P:  Cont tele monitoring Scheduled diltiazem sotalol  Diuresis on hold, follow wt and I/O Holding coumadin -->IV heparin in prep for ortho sgy  RENAL  Lab 09/07/12 0540 09/06/12 0525 09/06/12 0500 09/05/12 0457 09/04/12 0454 09/03/12 0455  NA  135 -- 136 136 137 135  K 3.6 -- 3.4* -- -- --  CL 86* -- 84* 83* 84* 86*  CO2 >45* -- >45* >45* >45* 45*  BUN 73* -- 79* 83* 86* 89*  CREATININE 1.58* -- 1.71* 1.82* 1.86* 1.91*  CALCIUM 9.6 -- 9.9 9.9 9.7 9.3  MG -- -- -- -- -- --  PHOS 3.4 3.8 -- 4.1 3.6 3.0   Intake/Output      11/03 0701 - 11/04 0700 11/04 0701 - 11/05 0700   P.O. 240    I.V. (mL/kg) 673.5 (6.4)     IV Piggyback     Total Intake(mL/kg) 913.5 (8.7)    Urine (mL/kg/hr) 3975 (1.6)    Stool 1    Total Output 3976    Net -3062.5         Stool Occurrence 2 x     Foley:  10/24 Renal US 10/27: right kidney WNL, left not well assessed.   A:   Acute on Chronic renal failure.  Hypokalemia  Followed by nephrology, she is lasix resistant. Changed to demadex w/ what appears to be a favorable response.  P:   Replaced K Avoid nephrotoxins.  Renal adjust meds Diuretics held last 3 days, ? Timing restart >> renal following  GASTROINTESTINAL  Lab 09/07/12 0540 09/06/12 0525 09/05/12 0457 09/04/12 0454 09/03/12 0455  AST -- -- -- -- --  ALT -- -- -- -- --  ALKPHOS -- -- -- -- --  BILITOT -- -- -- -- --  PROT -- -- -- -- --  ALBUMIN 2.9* 3.0* 3.0* 2.9* 2.7*    A:   Obesity Malnutrition  Has been acutely on chronically ill for several weeks now.   P:   Pt eating a small amount of Carb controlled diet. Ensure Complete added   HEMATOLOGIC  Lab 09/07/12 0540 09/06/12 0500 09/05/12 0457 09/04/12 0454 09/03/12 0455 09/02/12 0401 09/01/12 0348  HGB 7.9* 8.9* 8.6* 8.6* -- 8.4* --  HCT 26.2* 30.2* 28.4* 28.2* -- 26.3* --  PLT 231 232 221 171 -- 134* --  INR 1.18 -- -- -- 1.41 1.68* 1.89*  APTT -- -- -- -- -- -- --   A:   Anemia of Chronic disease.  Coumadin induced coagulopathy  Got 1 unit PRBC on 10/25. No evidence of active bleeding.  P:  Trend CBC Trigger to transfuse < 7 PPI   INFECTIOUS  Lab 09/07/12 0540 09/06/12 0500 09/05/12 0457 09/04/12 0454 09/02/12 0401 09/01/12 0348  WBC 8.0 9.0 8.6 6.8 7.6 --  PROCALCITON -- -- -- -- <0.10 0.12   Cultures: Antibiotics:   A:  No current evidence of infection.  P:   Supportive care Trend WBC  ENDOCRINE  Lab 09/07/12 0753 09/07/12 0347 09/06/12 2108 09/06/12 1607 09/06/12 1134  GLUCAP 197* 236* 222* 262* 172*   A:   DM w/ hyperglycemia P:   Cont SSI lantus 15u   NEUROLOGIC  A:  Acute Encephalopathy (resolved).   P:   Supportive care Minimal narcotics  ? reintroduce gabapentin or lyrica  Goals of care Dr Henrene Pastor spoke at length with the pt and two of her daughters. See note on 10/29, full DNR Not candidate for HD  BEST PRACTICE / DISPOSITION Level of Care:  SDU Primary Service:  triad Consultants:  PCCM Code Status:  Full-->DNR Diet:   DVT Px:  Was on coumadin, currently heparin gtt GI Px:  PPI Skin Integrity:  intact Social / Family:  Updated.  Levy Pupa, MD, PhD 09/07/2012, 9:10 AM [facility] [phone] or if no answer [phone]

## 2012-09-07 NOTE — Progress Notes (Signed)
Subjective: Resting quietly, to go to OR today for repair of leg fractures  Objective Vital signs in last 24 hours: Filed Vitals:   09/07/12 0600 09/07/12 0800 09/07/12 0926 09/07/12 1021  BP: 96/47 117/60  103/55  Pulse: 59 61    Temp:  98.2 F (36.8 C)    TempSrc:  Oral    Resp: 9 8    Height:      Weight:      SpO2: 97% 98% 100%    Weight change:   Intake/Output Summary (Last 24 hours) at 09/07/12 1201 Last data filed at 09/07/12 0900  Gross per 24 hour  Intake  804.5 ml  Output   2661 ml  Net -1856.5 ml   Labs: Basic Metabolic Panel:  Lab 09/07/12 1191 09/06/12 0525 09/06/12 0500 09/05/12 0457 09/04/12 0454 09/03/12 0455 09/02/12 0401 09/01/12 0342  NA 135 -- 136 136 137 135 132* 131*  K 3.6 -- 3.4* 3.9 3.4* 3.5 3.2* 4.2  CL 86* -- 84* 83* 84* 86* 87* 88*  CO2 >45* -- >45* >45* >45* 45* 41* 35*  GLUCOSE 232* -- 114* 168* 229* 179* 146* 242*  BUN 73* -- 79* 83* 86* 89* 84* 83*  CREATININE 1.58* -- 1.71* 1.82* 1.86* 1.91* 2.39* 2.77*  ALB -- -- -- -- -- -- -- --  CALCIUM 9.6 -- 9.9 9.9 9.7 9.3 8.8 8.7  PHOS 3.4 3.8 -- 4.1 3.6 3.0 4.3 5.6*   Liver Function Tests:  Lab 09/07/12 0540 09/06/12 0525 09/05/12 0457  AST -- -- --  ALT -- -- --  ALKPHOS -- -- --  BILITOT -- -- --  PROT -- -- --  ALBUMIN 2.9* 3.0* 3.0*   No results found for this basename: LIPASE:3,AMYLASE:3 in the last 168 hours No results found for this basename: AMMONIA:3 in the last 168 hours CBC:  Lab 09/07/12 0540 09/06/12 0500 09/05/12 0457 09/04/12 0454  WBC 8.0 9.0 8.6 6.8  NEUTROABS -- -- -- --  HGB 7.9* 8.9* 8.6* 8.6*  HCT 26.2* 30.2* 28.4* 28.2*  MCV 88.2 88.8 87.9 87.6  PLT 231 232 221 171   PT/INR: @labrcntip (inr:5) Cardiac Enzymes: No results found for this basename: CKTOTAL:5,CKMB:5,CKMBINDEX:5,TROPONINI:5 in the last 168 hours CBG:  Lab 09/07/12 0753 09/07/12 0347 09/06/12 2108 09/06/12 1607 09/06/12 1134  GLUCAP 197* 236* 222* 262* 172*    Iron Studies: No results  found for this basename: IRON:30,TIBC:30,TRANSFERRIN:30,FERRITIN:30 in the last 168 hours  Physical Exam:  Blood pressure 103/55, pulse 61, temperature 98.2 F (36.8 C), temperature source Oral, resp. rate 8, height 5\' 3"  (1.6 m), weight 105.2 kg (231 lb 14.8 oz), SpO2 100.00%.  Gen:WD obese AAF in NAD CVS:no rub  Resp:CTA  YNW:GNFAOZ  Ext: 1+ pedal edema  Assessment/Plan 1. AKI on CRF due to decomp HF- Baseline creat 1.7. Resolved. Creat down to 1.58, vol excess resolved. Pt is not a candidate for HD given advanced age, morbid obesity, multiple irreversible chronic medical issues. We have discussed this with the patient and her daughter. All are in agreement not to attempt RRT. No further suggestions, will sign off. Please call as needed. 2. Volume excess/ R-sided HF due to OHS- resolved. Diuretics on hold due to #3, would resume when indicated. Was taking torsemide at time of admission and that worked very well here as well, recommend continue to use torsemide as needed for volume/edema control.  3. Metabolic alkalosis- acute on chronic due to diuresis. Getting Diamox started 11/1 for this problem. Would stop  Diamox when serum CO2 less than 40 (baseline high on admit at 35).  4. Leg fractures- to OR today 5. Hypercapnic respiratory failure/Obesity-hypoventilation syndrome- improved. Cont with O2 via [place] per PCCM  Gina Moselle  MD [facility] [phone] pgr    [phone] cell 09/07/2012, 12:01 PM

## 2012-09-07 NOTE — Progress Notes (Signed)
Dr Raylene Everts Paged, person on call Maryln Manuel returned page. MD paged to confirm if patient will be having surgery tomorrow and when to turn off pt's heparin infusion. Maryln Manuel said according to Dr Nilsa Nutting schedule from OR pt is not yet on the list for surgery tomorrow, he advised to keep Pt NPO and to continue with the Heparin infusion until Dr Charlann Boxer rounds on Pt.

## 2012-09-07 NOTE — Progress Notes (Signed)
Patient ID: Gina Ashley, female   DOB: 1927-09-17, 78 y.o.   MRN: [id number]  Patient ready for OR today  Medical concerns related and reviewed, will mention to anesthesiology so they can review before tonight  NPO Consent on chart  To OR tonight for ORIF R distal femur and ex-fix of left proximal tibia fx

## 2012-09-07 NOTE — Clinical Social Work Note (Signed)
CSW continues to follow for SNF. CSW sent SNF update today as Pt plans for surgery later today.  CSW to check on Pt in the am.   Doreen Salvage, LCSW ICU/Stepdown Clinical Social Worker [facility] Cell [phone] Hours 8am-1200pm M-F

## 2012-09-08 ENCOUNTER — Encounter (HOSPITAL_COMMUNITY)
Admission: EM | Disposition: A | Discharge status: Skilled Nursing Facility | Payer: Self-pay | Source: Home / Self Care | Attending: Internal Medicine

## 2012-09-08 ENCOUNTER — Inpatient Hospital Stay (HOSPITAL_COMMUNITY): Payer: Medicare Other

## 2012-09-08 ENCOUNTER — Inpatient Hospital Stay (HOSPITAL_COMMUNITY): Payer: Medicare Other | Admitting: Anesthesiology

## 2012-09-08 ENCOUNTER — Encounter (HOSPITAL_COMMUNITY): Payer: Self-pay | Admitting: Anesthesiology

## 2012-09-08 HISTORY — PX: ORIF PERIPROSTHETIC FRACTURE: SHX5034

## 2012-09-08 HISTORY — PX: EXTERNAL FIXATION LEG: SHX1549

## 2012-09-08 LAB — GLUCOSE, CAPILLARY
Glucose-Capillary: 149 mg/dL — ABNORMAL HIGH (ref 70–99)
Glucose-Capillary: 111 mg/dL — ABNORMAL HIGH (ref 70–99)

## 2012-09-08 LAB — CBC
HCT: 27 % — ABNORMAL LOW (ref 36.0–46.0)
Hemoglobin: 8 g/dL — ABNORMAL LOW (ref 12.0–15.0)
MCH: 26.6 pg (ref 26.0–34.0)
MCV: 89.7 fL (ref 78.0–100.0)
Platelets: 242 10*3/uL (ref 150–400)
RBC: 3.01 MIL/uL — ABNORMAL LOW (ref 3.87–5.11)

## 2012-09-08 LAB — URINE CULTURE: Colony Count: >=100000

## 2012-09-08 LAB — RENAL FUNCTION PANEL
Albumin: 2.8 g/dL — ABNORMAL LOW (ref 3.5–5.2)
BUN: 68 mg/dL — ABNORMAL HIGH (ref 6–23)
Chloride: 88 mEq/L — ABNORMAL LOW (ref 96–112)
Creatinine, Ser: 1.53 mg/dL — ABNORMAL HIGH (ref 0.50–1.10)
Glucose, Bld: 191 mg/dL — ABNORMAL HIGH (ref 70–99)

## 2012-09-08 LAB — PREPARE RBC (CROSSMATCH)

## 2012-09-08 LAB — BLOOD GAS, ARTERIAL
Acid-Base Excess: 16 mmol/L — ABNORMAL HIGH (ref 0.0–2.0)
Bicarbonate: 33.4 mEq/L — ABNORMAL HIGH (ref 20.0–24.0)
Bicarbonate: 45.1 mEq/L — ABNORMAL HIGH (ref 20.0–24.0)
Drawn by: 244901
Expiratory PAP: 7
FIO2: 1 %
Inspiratory PAP: 14
O2 Saturation: 99.3 %
Patient temperature: 37
TCO2: 30 mmol/L (ref 0–100)
TCO2: 40.7 mmol/L (ref 0–100)
pCO2 arterial: 50.3 mmHg — ABNORMAL HIGH (ref 35.0–45.0)
pCO2 arterial: 72.5 mmHg (ref 35.0–45.0)
pH, Arterial: 7.391 (ref 7.350–7.450)
pH, Arterial: 7.408 (ref 7.350–7.450)
pH, Arterial: 7.438 (ref 7.350–7.450)
pO2, Arterial: 99.3 mmHg (ref 80.0–100.0)

## 2012-09-08 LAB — HEPARIN LEVEL (UNFRACTIONATED): Heparin Unfractionated: 0.47 IU/mL (ref 0.30–0.70)

## 2012-09-08 SURGERY — OPEN REDUCTION INTERNAL FIXATION (ORIF) PERIPROSTHETIC FRACTURE
Anesthesia: General | Site: Leg Upper | Laterality: Right | Wound class: Clean

## 2012-09-08 MED ORDER — CISATRACURIUM BESYLATE (PF) 10 MG/5ML IV SOLN
INTRAVENOUS | Status: DC | PRN
  Administered 2012-09-08: 6 mg via INTRAVENOUS
  Administered 2012-09-08: 14 mg via INTRAVENOUS

## 2012-09-08 MED ORDER — INSULIN ASPART 100 UNIT/ML ~~LOC~~ SOLN
2.0000 [IU] | SUBCUTANEOUS | Status: DC
  Administered 2012-09-09 (×2): 2 [IU] via SUBCUTANEOUS
  Administered 2012-09-09 (×2): 4 [IU] via SUBCUTANEOUS
  Administered 2012-09-09 (×2): 2 [IU] via SUBCUTANEOUS
  Administered 2012-09-10 (×4): 4 [IU] via SUBCUTANEOUS
  Administered 2012-09-10: 2 [IU] via SUBCUTANEOUS
  Administered 2012-09-10 – 2012-09-11 (×5): 4 [IU] via SUBCUTANEOUS
  Administered 2012-09-11: 2 [IU] via SUBCUTANEOUS
  Administered 2012-09-11: 4 [IU] via SUBCUTANEOUS
  Administered 2012-09-12: 12:00:00 via SUBCUTANEOUS
  Administered 2012-09-12: 4 [IU] via SUBCUTANEOUS
  Administered 2012-09-12 (×2): 2 [IU] via SUBCUTANEOUS

## 2012-09-08 MED ORDER — SUCCINYLCHOLINE CHLORIDE 20 MG/ML IJ SOLN
INTRAMUSCULAR | Status: DC | PRN
  Administered 2012-09-08: 100 mg via INTRAVENOUS

## 2012-09-08 MED ORDER — CEFAZOLIN SODIUM-DEXTROSE 2-3 GM-% IV SOLR
2.0000 g | Freq: Four times a day (QID) | INTRAVENOUS | Status: AC
  Administered 2012-09-09 (×2): 2 g via INTRAVENOUS
  Filled 2012-09-08 (×2): qty 50

## 2012-09-08 MED ORDER — FENTANYL CITRATE 0.05 MG/ML IJ SOLN
50.0000 ug | INTRAMUSCULAR | Status: DC | PRN
  Administered 2012-09-09 (×2): 100 ug via INTRAVENOUS
  Filled 2012-09-08 (×2): qty 2

## 2012-09-08 MED ORDER — MIDAZOLAM HCL 5 MG/ML IJ SOLN
INTRAMUSCULAR | Status: AC
  Administered 2012-09-08: 2 mg
  Filled 2012-09-08: qty 1

## 2012-09-08 MED ORDER — POTASSIUM CHLORIDE 20 MEQ/15ML (10%) PO LIQD
40.0000 meq | Freq: Once | ORAL | Status: AC
  Administered 2012-09-08: 40 meq via ORAL
  Filled 2012-09-08: qty 30

## 2012-09-08 MED ORDER — CEFAZOLIN SODIUM-DEXTROSE 2-3 GM-% IV SOLR
INTRAVENOUS | Status: AC
  Filled 2012-09-08: qty 50

## 2012-09-08 MED ORDER — PROMETHAZINE HCL 25 MG/ML IJ SOLN: 6.2500 mg | INTRAMUSCULAR | Status: DC | PRN

## 2012-09-08 MED ORDER — SODIUM CHLORIDE 0.9 % IV SOLN
INTRAVENOUS | Status: DC | PRN
  Administered 2012-09-08 (×2): via INTRAVENOUS

## 2012-09-08 MED ORDER — HYDROMORPHONE HCL 2 MG PO TABS
1.0000 mg | ORAL_TABLET | ORAL | Status: DC | PRN
  Administered 2012-09-08: 11:00:00 via ORAL
  Filled 2012-09-08: qty 1

## 2012-09-08 MED ORDER — CEFAZOLIN SODIUM-DEXTROSE 2-3 GM-% IV SOLR
2.0000 g | Freq: Once | INTRAVENOUS | Status: AC
  Administered 2012-09-08: 2 g via INTRAVENOUS
  Filled 2012-09-08: qty 50

## 2012-09-08 MED ORDER — DOPAMINE-DEXTROSE 3.2-5 MG/ML-% IV SOLN
INTRAVENOUS | Status: AC
  Filled 2012-09-08: qty 250

## 2012-09-08 MED ORDER — 0.9 % SODIUM CHLORIDE (POUR BTL) OPTIME
TOPICAL | Status: DC | PRN
  Administered 2012-09-08: 1000 mL

## 2012-09-08 MED ORDER — MIDAZOLAM HCL 5 MG/ML IJ SOLN
1.0000 mg | INTRAMUSCULAR | Status: DC | PRN
  Administered 2012-09-09 (×2): 2 mg via INTRAVENOUS
  Filled 2012-09-08 (×4): qty 1

## 2012-09-08 MED ORDER — FENTANYL CITRATE 0.05 MG/ML IJ SOLN
INTRAMUSCULAR | Status: DC | PRN
  Administered 2012-09-08: 25 ug via INTRAVENOUS
  Administered 2012-09-08: 50 ug via INTRAVENOUS
  Administered 2012-09-08: 25 ug via INTRAVENOUS
  Administered 2012-09-08 (×7): 50 ug via INTRAVENOUS

## 2012-09-08 MED ORDER — FENTANYL CITRATE 0.05 MG/ML IJ SOLN
INTRAMUSCULAR | Status: AC
  Filled 2012-09-08: qty 2

## 2012-09-08 MED ORDER — LIDOCAINE HCL (CARDIAC) 20 MG/ML IV SOLN
INTRAVENOUS | Status: DC | PRN
  Administered 2012-09-08: 50 mg via INTRAVENOUS

## 2012-09-08 MED ORDER — HYDROMORPHONE HCL PF 1 MG/ML IJ SOLN
0.2500 mg | INTRAMUSCULAR | Status: DC | PRN
  Administered 2012-09-08 (×2): 0.5 mg via INTRAVENOUS
  Filled 2012-09-08: qty 1

## 2012-09-08 SURGICAL SUPPLY — 103 items
150mm carbon fiber rod ×3 IMPLANT
250mm carbon fiber rod ×6 IMPLANT
5mm bone screw X 180mm ×6 IMPLANT
BAG ZIPLOCK 12X15 (MISCELLANEOUS) ×6 IMPLANT
BANDAGE ELASTIC 4 VELCRO ST LF (GAUZE/BANDAGES/DRESSINGS) IMPLANT
BANDAGE ELASTIC 6 VELCRO ST LF (GAUZE/BANDAGES/DRESSINGS) IMPLANT
BANDAGE ESMARK 6X9 LF (GAUZE/BANDAGES/DRESSINGS) IMPLANT
BANDAGE GAUZE ELAST BULKY 4 IN (GAUZE/BANDAGES/DRESSINGS) ×3 IMPLANT
BIT DRILL 3.2 CALIBRATED (BIT) ×2
BIT DRILL 3.2MM CALIBRATED (BIT) ×2 IMPLANT
BIT DRILL 3.2X240 A/O LONG (BIT) ×3 IMPLANT
BIT DRILL 3.8 CALIBRATED (BIT) ×2
BIT DRILL 3.8MM CALIBRATED (BIT) ×2 IMPLANT
BNDG COHESIVE 6X5 TAN STRL LF (GAUZE/BANDAGES/DRESSINGS) ×3 IMPLANT
BNDG ELASTIC 6X15 VLCR STRL LF (GAUZE/BANDAGES/DRESSINGS) ×6 IMPLANT
BNDG ESMARK 6X9 LF (GAUZE/BANDAGES/DRESSINGS)
CLAMP PIN-ROD (Clamp) ×12 IMPLANT
CLAMP ROD-ROD (Clamp) ×6 IMPLANT
CLEANER TIP ELECTROSURG 2X2 (MISCELLANEOUS) ×3 IMPLANT
CLOTH BEACON ORANGE TIMEOUT ST (SAFETY) ×6 IMPLANT
COVER SURGICAL LIGHT HANDLE (MISCELLANEOUS) ×3 IMPLANT
CUFF TOURN SGL QUICK 34 (TOURNIQUET CUFF)
CUFF TRNQT CYL 34X4X40X1 (TOURNIQUET CUFF) IMPLANT
DRAPE C-ARM 42X72 X-RAY (DRAPES) ×6 IMPLANT
DRAPE C-ARMOR (DRAPES) ×3 IMPLANT
DRAPE EXTREMITY BILATERAL (DRAPE) ×3 IMPLANT
DRAPE INCISE IOBAN 66X45 STRL (DRAPES) ×3 IMPLANT
DRAPE ORTHO SPLIT 77X108 STRL (DRAPES) ×2
DRAPE POUCH INSTRU U-SHP 10X18 (DRAPES) ×3 IMPLANT
DRAPE SURG 17X11 SM STRL (DRAPES) ×3 IMPLANT
DRAPE SURG ORHT 6 SPLT 77X108 (DRAPES) ×4 IMPLANT
DRAPE U-SHAPE 47X51 STRL (DRAPES) ×12 IMPLANT
DRILL BIT 3.2MM CALIBRATED (BIT) ×1
DRILL BIT 3.8MM CALIBRATED (BIT) ×1
DRSG ADAPTIC 3X8 NADH LF (GAUZE/BANDAGES/DRESSINGS) IMPLANT
DRSG EMULSION OIL 3X16 NADH (GAUZE/BANDAGES/DRESSINGS) IMPLANT
DRSG PAD ABDOMINAL 8X10 ST (GAUZE/BANDAGES/DRESSINGS) ×6 IMPLANT
DURAPREP 26ML APPLICATOR (WOUND CARE) ×6 IMPLANT
ELECT BLADE TIP CTD 4 INCH (ELECTRODE) ×3 IMPLANT
ELECT REM PT RETURN 9FT ADLT (ELECTROSURGICAL) ×3
ELECTRODE REM PT RTRN 9FT ADLT (ELECTROSURGICAL) ×2 IMPLANT
EVACUATOR 1/8 PVC DRAIN (DRAIN) IMPLANT
EVACUATOR SILICONE 100CC (DRAIN) IMPLANT
FACESHIELD LNG OPTICON STERILE (SAFETY) ×12 IMPLANT
GAUZE XEROFORM 5X9 LF (GAUZE/BANDAGES/DRESSINGS) ×6 IMPLANT
GLOVE BIO SURGEON STRL SZ8 (GLOVE) ×3 IMPLANT
GLOVE BIOGEL PI IND STRL 6 (GLOVE) ×2 IMPLANT
GLOVE BIOGEL PI IND STRL 7.5 (GLOVE) ×2 IMPLANT
GLOVE BIOGEL PI IND STRL 8 (GLOVE) ×4 IMPLANT
GLOVE BIOGEL PI INDICATOR 6 (GLOVE) ×1
GLOVE BIOGEL PI INDICATOR 7.5 (GLOVE) ×1
GLOVE BIOGEL PI INDICATOR 8 (GLOVE) ×2
GLOVE ECLIPSE 8.0 STRL XLNG CF (GLOVE) ×3 IMPLANT
GLOVE ORTHO TXT STRL SZ7.5 (GLOVE) ×6 IMPLANT
GLOVE SURG ORTHO 8.0 STRL STRW (GLOVE) ×3 IMPLANT
GLOVE SURG SS PI 6.5 STRL IVOR (GLOVE) ×3 IMPLANT
GOWN BRE IMP PREV XXLGXLNG (GOWN DISPOSABLE) ×6 IMPLANT
GOWN STRL NON-REIN LRG LVL3 (GOWN DISPOSABLE) ×6 IMPLANT
GUIDEPIN 3.2  ENDO CALB STRL (PIN) ×1
GUIDEPIN 3.2 ENDO CALB STRL (PIN) ×2 IMPLANT
IMMOBILIZER KNEE 20 (SOFTGOODS)
IMMOBILIZER KNEE 20 THIGH 36 (SOFTGOODS) IMPLANT
KIT BASIN OR (CUSTOM PROCEDURE TRAY) ×6 IMPLANT
MANIFOLD NEPTUNE II (INSTRUMENTS) ×3 IMPLANT
NEEDLE HYPO 22GX1.5 SAFETY (NEEDLE) IMPLANT
NS IRRIG 1000ML POUR BTL (IV SOLUTION) ×3 IMPLANT
PACK LOWER EXTREMITY WL (CUSTOM PROCEDURE TRAY) ×3 IMPLANT
PACK TOTAL JOINT (CUSTOM PROCEDURE TRAY) ×3 IMPLANT
PADDING CAST COTTON 6X4 STRL (CAST SUPPLIES) IMPLANT
PASSER SUT SWANSON 36MM LOOP (INSTRUMENTS) IMPLANT
PLATE FEMORAL POLY HOLD RT 9 (Plate) ×3 IMPLANT
POSITIONER SURGICAL ARM (MISCELLANEOUS) ×3 IMPLANT
SCREW BONE SELF DRILL/TAP5X150 (Screw) ×6 IMPLANT
SCREW LOCK DIST FEM 5.5X25 (Screw) ×3 IMPLANT
SCREW LOCK DIST FEM 5.5X80 (Screw) ×3 IMPLANT
SCREW LOCK PLY DIST FEM 4.5X40 (Screw) ×6 IMPLANT
SCREW LOCK PLY PROX TIB 8X25 (Screw) ×3 IMPLANT
SCREW NLOCK CORT 4.5X40 (Screw) ×3 IMPLANT
SCREW NLOCK CORT 4.5X44 (Screw) ×6 IMPLANT
SCREW NLOCK DIST FEM 5.5X90 (Screw) ×3 IMPLANT
SOL PREP POV-IOD 16OZ 10% (MISCELLANEOUS) IMPLANT
SOL PREP PROV IODINE SCRUB 4OZ (MISCELLANEOUS) IMPLANT
SPONGE GAUZE 4X4 12PLY (GAUZE/BANDAGES/DRESSINGS) ×9 IMPLANT
SPONGE LAP 18X18 X RAY DECT (DISPOSABLE) ×3 IMPLANT
SPONGE LAP 4X18 X RAY DECT (DISPOSABLE) ×3 IMPLANT
STAPLER SKIN PROX WIDE 3.9 (STAPLE) IMPLANT
STAPLER VISISTAT 35W (STAPLE) ×6 IMPLANT
STOCKINETTE 8 INCH (MISCELLANEOUS) ×6 IMPLANT
STRIP CLOSURE SKIN 1/2X4 (GAUZE/BANDAGES/DRESSINGS) IMPLANT
SUCTION FRAZIER TIP 10 FR DISP (SUCTIONS) IMPLANT
SUT ETHIBOND NAB CT1 #1 30IN (SUTURE) ×3 IMPLANT
SUT ETHILON 2 0 PS N (SUTURE) ×3 IMPLANT
SUT ETHILON 3 0 PS 1 (SUTURE) IMPLANT
SUT MNCRL AB 4-0 PS2 18 (SUTURE) ×3 IMPLANT
SUT VIC AB 1 CT1 36 (SUTURE) ×6 IMPLANT
SUT VIC AB 2-0 CT1 27 (SUTURE) ×2
SUT VIC AB 2-0 CT1 TAPERPNT 27 (SUTURE) ×4 IMPLANT
SYR CONTROL 10ML LL (SYRINGE) IMPLANT
TAPE CLOTH SURG 6X10 WHT LF (GAUZE/BANDAGES/DRESSINGS) ×6 IMPLANT
TOWEL OR 17X26 10 PK STRL BLUE (TOWEL DISPOSABLE) ×12 IMPLANT
TRAY FOLEY CATH 14FRSI W/METER (CATHETERS) IMPLANT
WATER STERILE IRR 1500ML POUR (IV SOLUTION) ×3 IMPLANT
YANKAUER SUCT BULB TIP 10FT TU (MISCELLANEOUS) IMPLANT

## 2012-09-08 NOTE — Progress Notes (Signed)
Consulted Dr. Rennis Chris, on call, for Dr. Charlann Boxer regarding resuming Heparin infusion. Heparin restarted per Pharmacy protocol.

## 2012-09-08 NOTE — Progress Notes (Signed)
ANTICOAGULATION CONSULT NOTE - Follow Up Consult  Pharmacy Consult for Heparin Indication: atrial fibrillation  Allergies  Allergen Reactions  . Morphine Sulfate Other (See Comments)    REACTION: change in personality  . Remeron (Mirtazapine) Other (See Comments)    Altered mental status, lethargy  . Tuna (Fish Allergy)   . Penicillins Rash    Patient Measurements: Height: 5\' 3"  (160 cm) Weight: 231 lb 14.8 oz (105.2 kg) IBW/kg (Calculated) : 52.4  Heparin Dosing Weight:   Vital Signs: Temp: 96 F (35.6 C) (11/05 0427) Temp src: Oral (11/04 2000) BP: 114/54 mmHg (11/05 0400) Pulse Rate: 57  (11/05 0400)  Labs:  Basename 09/08/12 0348 09/08/12 0345 09/07/12 0540 09/06/12 0525 09/06/12 0500  HGB 8.0* -- 7.9* -- --  HCT 27.0* -- 26.2* -- 30.2*  PLT 242 -- 231 -- 232  APTT -- -- -- -- --  LABPROT -- -- 14.8 -- --  INR -- -- 1.18 -- --  HEPARINUNFRC 0.14* -- 0.46 0.34 --  CREATININE -- 1.53* 1.58* -- 1.71*  CKTOTAL -- -- -- -- --  CKMB -- -- -- -- --  TROPONINI -- -- -- -- --    Estimated Creatinine Clearance: 32.3 ml/min (by C-G formula based on Cr of 1.53).   Medications:  Infusions:    . sodium chloride 20 mL/hr (09/07/12 1000)  . heparin 1,450 Units/hr (09/07/12 2305)  . [DISCONTINUED] heparin 1,450 Units/hr (09/07/12 0200)    Assessment: Patient with low heparin level but level drawn only about 5hours after drip restarted.  Goal of Therapy:  Heparin level 0.3-0.7 units/ml Monitor platelets by anticoagulation protocol: Yes   Plan:  Continue heparin at current rate, recheck at 1000  Darlina Guys, Jacquenette Shone Crowford 09/08/2012,5:57 AM

## 2012-09-08 NOTE — Progress Notes (Signed)
Patient ID: Gina Ashley, female   DOB: 1927/09/13, 78 y.o.   MRN: [id number]  Surgery cancelled yesterday due to antibody discovered during type and screening.  Couldn't get blood ready in time.  These issues have been resolved and blood now available in the bank  Ready for surgery today  NPO since midnight Reviewed with family Probably will keep her intubated post-operatively due to elevated CO2 and BIPAP dependence

## 2012-09-08 NOTE — Progress Notes (Signed)
ANTICOAGULATION CONSULT NOTE - Follow Up Consult  Pharmacy Consult for Heparin Indication: atrial fibrillation  Allergies  Allergen Reactions  . Morphine Sulfate Other (See Comments)    REACTION: change in personality  . Remeron (Mirtazapine) Other (See Comments)    Altered mental status, lethargy  . Tuna (Fish Allergy)   . Penicillins Rash    Patient Measurements: Height: 5\' 3"  (160 cm) Weight: 229 lb 11.5 oz (104.2 kg) IBW/kg (Calculated) : 52.4   Vital Signs: Temp: 98.1 F (36.7 C) (11/05 0800) Temp src: Oral (11/05 0800) BP: 139/71 mmHg (11/05 1000) Pulse Rate: 87  (11/05 1000)  Labs:  Basename 09/08/12 1000 09/08/12 0348 09/08/12 0345 09/07/12 0540 09/06/12 0500  HGB -- 8.0* -- 7.9* --  HCT -- 27.0* -- 26.2* 30.2*  PLT -- 242 -- 231 232  APTT -- -- -- -- --  LABPROT -- -- -- 14.8 --  INR -- -- -- 1.18 --  HEPARINUNFRC 0.47 0.14* -- 0.46 --  CREATININE -- -- 1.53* 1.58* 1.71*  CKTOTAL -- -- -- -- --  CKMB -- -- -- -- --  TROPONINI -- -- -- -- --    Estimated Creatinine Clearance: 32.2 ml/min (by C-G formula based on Cr of 1.53).   Medications:  Infusions:     . sodium chloride 20 mL/hr (09/07/12 1000)  . heparin 1,450 Units/hr (09/07/12 2305)    Assessment:  12 YOF with a history of A.fib on chronic Coumadin. Admitted on 10/24 with b/l LE fractures s/p fall. Surgical intervention tentatively planned for 11/4. Plan is to hold coumadin and bridge with IV heparin prior to surgery  HL in therapeutic range now on 1450 units/hr.  Plan is for transfusion then to the OR this afternoon. Nursing has an order to stop heparin once blood is started.  Goal of Therapy:  Heparin level 0.3-0.7 units/ml Monitor platelets by anticoagulation protocol: Yes   Plan:   Cont heparin at 1450 units/hr until stopped for surgery.  Pharmacy will f/u for resumption of anticoagulation post-op.  Charolotte Eke, PharmD, pager [phone]. 09/08/2012,12:20 PM.

## 2012-09-08 NOTE — Progress Notes (Addendum)
eLink Physician-Brief Progress Note Patient Name: Gina Ashley DOB: February 03, 1927 MRN: [id number]  Date of Service  09/08/2012   HPI/Events of Note  Patient returned from OR on vent   eICU Interventions  pccm now assumes primary service Change sdu to ICU status Will send vent and sedation and lab orders       - change dilaudid to fentnayl prn Change current in[place] to ICU hyperglycemia   Intervention Category Major Interventions: Other:  Gina Ashley 09/08/2012, 11:41 PM

## 2012-09-08 NOTE — Progress Notes (Signed)
Name: Gina Ashley MRN: [id number] DOB: [birth year]    LOS: 12  Referring Provider:  Rito Ehrlich Reason for Referral:  AMS  PULMONARY / CRITICAL CARE MEDICINE  Brief patient description:  This is a 78 year old female w/ multiple co-morbids including: CRF stage IV, AF on coumadin, OSA (wears CPAP at HS). Recently transferred to SNF after ~ 2 week stay at [facility] for acute on chronic renal failure. Had unwitnessed fall on 10/24 resulting in BLE fractures. Admitted by medical service rx has included pain management, IV lasix gtt for volume overload and ortho consult. PCCM asked to see on 10/28 for AMS and concern about hypercarbia.   Subjective: Lethargic after pain control this am Wore BiPAP 4 hours last night No sgy yesterday due to no available blood products, hopefully going to OR this pm   Current Status: Stable  Vital Signs: Temp:  [96 F (35.6 C)-98.1 F (36.7 C)] 98.1 F (36.7 C) (11/05 0800) Pulse Rate:  [57-104] 75  (11/05 0800) Resp:  [7-14] 14  (11/05 0800) BP: (93-149)/(45-86) 149/77 mmHg (11/05 0800) SpO2:  [98 %-100 %] 100 % (11/05 0800) Weight:  [104.2 kg (229 lb 11.5 oz)] 104.2 kg (229 lb 11.5 oz) (11/05 0800) 2 liters n/c Physical Examination: General:  Chronically ill appearing 78 year old female. More awake.  Neuro: sleepy, non-verbal, no focal def HEENT:  + JVD, MM dry Cardiovascular:  Regular irregular  Lungs:  Decreased bases.  Clear upper lobes Abdomen:  + bowel sounds, soft nontender Musculoskeletal:  Lower extremity pain bilatterally pain Skin:  Slight edema lower extremities  Principal Problem:  *Multiple fractures of both lower extremities Active Problems:  DM  OBSTRUCTIVE SLEEP APNEA  GERD  PAF (paroxysmal atrial fibrillation), was in SR 06/2012   HTN (hypertension)  COPD (chronic obstructive pulmonary disease)  Hyponatremia  CKD (chronic kidney disease)  Pleural effusion  Hypercapnemia  Acute respiratory failure  Chronic diastolic heart  failure  Hypokalemia  Alkalosis, metabolic  Pain  UTI (lower urinary tract infection)   ASSESSMENT AND PLAN  PULMONARY  Lab 09/08/12 0345  PHART 7.408  PCO2ART 72.5*  PO2ART 99.3  HCO3 45.1*  O2SAT 99.7   Ventilator Settings:   CXR:  bilateral edema R>L  ETT:    A:   Acute Hypercarbic Respiratory Failure: in setting of narcotic effect superimposed on underlying OSA and CRF:  needs long term BIPAP, not CPAP: this has improved significantly.  Bilateral atelectasis Pulmonary edema/volume excess +/- element of effusion CO2 holding in 70s, Much of this  likely being driven by contraction alk. She is at risk for concomitant resp acidosis given body habitus, renal fxn and narcotics.   P:   Cont BIPAP qHS  Minimize narcotics Agree with short term diamox (2 -3 days) to address contraction alkalosis >> will make chronically elevated pCO2 easier to manage flovent + xopenex  CARDIOVASCULAR No results found for this basename: TROPONINI:5,LATICACIDVEN:5, O2SATVEN:5,PROBNP:5 in the last 168 hours ECG:  aflutter Lines: ECHO 10/21: estimated ejection fraction was in the range of 55% to 60%. Wall motion was normal; there were no regional wall motion abnormalities. The study is not technically sufficient to allow evaluation of LV diastolic function. A:  AFib/Flutter HTN Pulmonary edema  Probable secondary PAH Both fib and HTN controlled.  P:  Cont tele monitoring Scheduled diltiazem sotalol  Holding coumadin -->IV heparin in prep for ortho sgy  RENAL  Lab 09/08/12 0345 09/07/12 0540 09/06/12 0525 09/06/12 0500 09/05/12 0457 09/04/12 0454  NA 136  135 -- 136 136 137  K 3.3* 3.6 -- -- -- --  CL 88* 86* -- 84* 83* 84*  CO2 44* >45* -- >45* >45* >45*  BUN 68* 73* -- 79* 83* 86*  CREATININE 1.53* 1.58* -- 1.71* 1.82* 1.86*  CALCIUM 9.7 9.6 -- 9.9 9.9 9.7  MG -- -- -- -- -- --  PHOS 3.2 3.4 3.8 -- 4.1 3.6   Intake/Output      11/04 0701 - 11/05 0700 11/05 0701 - 11/06 0700    P.O.     I.V. (mL/kg) 619.5 (5.9) 34.5 (0.3)   Total Intake(mL/kg) 619.5 (5.9) 34.5 (0.3)   Urine (mL/kg/hr) 2300 (0.9) 100   Stool 0 0   Total Output 2300 100   Net -1680.5 -65.5        Stool Occurrence 2 x 1 x    Foley:  10/24 Renal US 10/27: right kidney WNL, left not well assessed.   A:   Acute on Chronic renal failure.  Hypokalemia  Followed by nephrology, she is lasix resistant. Changed to demadex w/ what appears to be a favorable response. Now on scheduled Diamox and maintaining negative balance w/ continued improved creatinine  P:   Replaced K Avoid nephrotoxins.  Renal adjust meds Cont diamox, Use Torsemide for additional diuretic needs (will hold again 11/5).   GASTROINTESTINAL  Lab 09/08/12 0345 09/07/12 0540 09/06/12 0525 09/05/12 0457 09/04/12 0454  AST -- -- -- -- --  ALT -- -- -- -- --  ALKPHOS -- -- -- -- --  BILITOT -- -- -- -- --  PROT -- -- -- -- --  ALBUMIN 2.8* 2.9* 3.0* 3.0* 2.9*    A:   Obesity Malnutrition  Has been acutely on chronically ill for several weeks now.   P:   Pt eating a small amount of Carb controlled diet. Ensure Complete added   HEMATOLOGIC  Lab 09/08/12 0348 09/07/12 0540 09/06/12 0500 09/05/12 0457 09/04/12 0454 09/03/12 0455 09/02/12 0401  HGB 8.0* 7.9* 8.9* 8.6* 8.6* -- --  HCT 27.0* 26.2* 30.2* 28.4* 28.2* -- --  PLT 242 231 232 221 171 -- --  INR -- 1.18 -- -- -- 1.41 1.68*  APTT -- -- -- -- -- -- --   A:   Anemia of Chronic disease.  Coumadin induced coagulopathy  Got 1 unit PRBC on 10/25. No evidence of active bleeding.  P:  Trend CBC Trigger to transfuse < 7 PPI  For transfusion preop in anticipation of peri-op blood loss.  Stop heparin when we know surg is a go.   INFECTIOUS  Lab 09/08/12 0348 09/07/12 0540 09/06/12 0500 09/05/12 0457 09/04/12 0454 09/02/12 0401  WBC 7.5 8.0 9.0 8.6 6.8 --  PROCALCITON -- -- -- -- -- <0.10   Cultures: Antibiotics:  A:  No current evidence of infection.  P:     Supportive care Trend WBC  ENDOCRINE  Lab 09/08/12 0809 09/07/12 2217 09/07/12 1637 09/07/12 1134 09/07/12 0753  GLUCAP 149* 209* 99 158* 197*   A:   DM w/ hyperglycemia P:   Cont SSI lantus 15u   NEUROLOGIC  A:  Acute Encephalopathy (recurrent), initially this was due to hypercarbia. Now due to narcotics. Family states has had this problem w. Hydrocodone in past. Interestingly did ok w/ dilaudid.  P:   Supportive care Minimal narcotics, will try LOW dose dilaudid Repeat ABG Add cont CO2 monitoring as we adjust narcotics  Goals of care Dr Henrene Pastor spoke at length with the  pt and two of her daughters. See note on 10/29, full DNR Not candidate for HD  Summary She is a little more confused than baseline. I think that her contraction alk is driving her current CO2. She will be getting blood today so will let this address the issue of volume depletion. Will adjust narcotics to see if we can assist w/ confusion. Hope she can have her surgery today 11/5.  BEST PRACTICE / DISPOSITION Level of Care:  SDU Primary Service:  triad Consultants:  PCCM Code Status:  Full-->DNR Diet:   DVT Px:  Was on coumadin, currently heparin gtt GI Px:  PPI Skin Integrity:  intact Social / Family:  Updated.   Levy Pupa, MD, PhD 09/08/2012, 11:51 AM [facility] [phone] or if no answer [phone]

## 2012-09-08 NOTE — Transfer of Care (Signed)
Immediate Anesthesia Transfer of Care Note  Patient: Gina Ashley  Procedure(s) Performed: Procedure(s) (LRB) with comments: OPEN REDUCTION INTERNAL FIXATION (ORIF) PERIPROSTHETIC FRACTURE (Right) - ORIF periprosthetic right proximal femur fracture Spanning external fixator left distal tibia EXTERNAL FIXATION LEG (Left)  Patient Location: ICU  Anesthesia Type:General  Level of Consciousness: Patient remains intubated per anesthesia plan  Airway & Oxygen Therapy: Patient remains intubated per anesthesia plan and Patient placed on Ventilator (see vital sign flow sheet for setting)  Post-op Assessment: Post -op Vital signs reviewed and stable and patient sedated by ICU nurses.  Post vital signs: Reviewed and stable  Complications: No apparent anesthesia complications

## 2012-09-08 NOTE — Progress Notes (Signed)
PA, Matt, for Dr. Charlann Boxer stated that she would not be going to the OR this evening or on Tuesday due to PRBC not being available to her.

## 2012-09-08 NOTE — Brief Op Note (Signed)
08/27/2012 - 09/08/2012  10:09 PM  PATIENT:  Gina Ashley  78 y.o. female  PRE-OPERATIVE DIAGNOSIS:  1. Right periprosthetic distal femur fracture.  2. Left periprosthetic proximal tibia fracture  POST-OPERATIVE DIAGNOSIS: 1. Right periprosthetic distal femur fracture.  2. Left periprosthetic proximal tibia fracture  PROCEDURE:  Procedure(s) (LRB) with comments: 1.  OPEN REDUCTION INTERNAL FIXATION (ORIF) PERIPROSTHETIC FRACTURE (Right) - ORIF periprosthetic right proximal femur fracture 2. Closed reduction, Spanning external fixator left proximal tibia EXTERNAL FIXATION LEG (Left)  SURGEON:  Surgeon(s) and Role:    * Loanne Drilling, MD - Assisting    * Shelda Pal, MD - Primary  PHYSICIAN ASSISTANT: Lanney Gins, PA-C   ANESTHESIA:   general  EBL:  Total I/O In: 750 [I.V.:750] Out: 875 [Urine:625; Blood:250]  BLOOD ADMINISTERED:2 units PRBC given pre and peri-operatively  DRAINS: none   LOCAL MEDICATIONS USED:  NONE  SPECIMEN:  No Specimen  DISPOSITION OF SPECIMEN:  N/A  COUNTS:  YES  TOURNIQUET:  * No tourniquets in log *  DICTATION: .Other Dictation: Dictation Number [id number]  PLAN OF CARE: Admit to inpatient   PATIENT DISPOSITION:  ICU - intubated and hemodynamically stable.   Delay start of Pharmacological VTE agent (>24hrs) due to surgical blood loss or risk of bleeding: no

## 2012-09-08 NOTE — Progress Notes (Signed)
[facility] PROGRESS NOTE  Gina Ashley RUE:[id number] DOB: [birth year] DOA: 08/27/2012 PCP: Ernestine Conrad, MD  Assessment/Plan:  Acute on Chronic respiratory failure:  Patient presented with acute hypercarbic respiratory failure which is felt to be secondary to narcotic effect superimposed on underlying obstructive sleep apnea and chronic renal failure. She has been on BiPAP and tolerating it well. As a result of her diuresis she's had some metabolic alkalosis and is currently on Diamox. The patient is to continue on BiPAP each bedtime and whenever sleeping. The patient has had some increased somnolence with her current medication regimen. I've discussed it with critical care and the Vicryl be discontinued and the patient placed on oral Dilaudid.   Acute on CKD (chronic kidney disease): Patient back at baseline Cr. However she now demonstrates a contraction alkalosis with some small improvement in her serum bicarbonate while on Diamox.I will defer to nephrology for further recommendations.    Multiple fractures of both lower extremities:  Pt presently immobilized and is medically stable for surgery. Patient was scheduled for surgery yesterday however she required blood and did antibodies the surgery was delayed in order to obtain the blood. Closely the patient will proceed with surgery  Today.  UTI:  Patient has a urinalysis consistent with urinary tract infection. Urine cultures however show no growth and Cipro has been discontinued.   DM:  Blood sugars much better controlled. Continue Lantus, SSI and HS coverage.   Hyponatremia:  Improved now within normal range there    OBSTRUCTIVE SLEEP APNEA; For now wil continue with BiPaP instead of CPAP   Pleural effusion: Pt had a transient increase in Oxygen requirement this morning. Will get CXR if pt fails to wean down to 2 L/min of Oxygen.   Constipation: Pt has had multiple bowel movements since magnesium citrate  given.  Hypokalemia: Improved. Will continue to replete as needed  Code Status: DNR Family Communication: Updated daughter at the bedside.  Disposition Plan: Likely SNf at the time of discharge.  Sylina Henion A.  [facility] Pager [phone] 8PM-8AM, please contact night-coverage at [email or web address], password [facility] 09/08/2012, 6:59 PM  LOS: 12 days   Brief narrative: Pt reportedly from [facility] and had an unwitnessed fall around 4:00 am on day of admission. After the fall patient subsequently developed lower extremity discomfort and was subsequently taken to [facility] ED for further evaluation. History was obtained from daughters in the room and ED records as patient was given dilaudid for BL LE discomfort and subsequently was "sleepy" per family. In the room initially ortho technician was at bedside placing braces and patient had discomfort with manipulation of lower extremities while placing gauze. Since admission, her major issues that has led to a delay in surgical repair of fractures are respiratory failure and renal failure with fluid overload.   Consultants: Dr. Army Melia- Critical Care Dr. Colodonato-Nephrology Dr. Charlann Boxer- Orthopedic Surgery  Procedures:  None  Antibiotics:  None  HPI/Subjective: Pt states that she is just wanting to get her surgery over with.  Objective: Filed Vitals:   09/08/12 1645 09/08/12 1658 09/08/12 1742 09/08/12 1800  BP: 113/56 117/53 114/49 134/65  Pulse: 60 67 73 51  Temp: 98.1 F (36.7 C) 98.1 F (36.7 C) 98 F (36.7 C)   TempSrc: Oral Oral Oral   Resp: 16 10 14 16   Height:      Weight:      SpO2: 96% 96% 98% 97%   Weight change:   Intake/Output Summary (Last 24 hours) at 09/08/12 1859  Last data filed at 09/08/12 1800  Gross per 24 hour  Intake   1666 ml  Output   2315 ml  Net   -649 ml    General: Alert, awake, oriented x3, in no acute distress. Somewhat groggy this morning. HEENT: [place]/AT PEERL, EOMI  Heart: Regular rate  and rhythm, without murmurs, rubs, gallops, PMI non-displaced, no heaves or thrills on palpation.  Lungs: Mild bibasilar crackles noted. No increased vocal fremitus resonant to percussion  Abdomen: Soft, nontender, nondistended, positive bowel sounds, no masses no hepatosplenomegaly noted. Musculoskeletal: No warm swelling or erythema around joints, no spinal tenderness noted.   Data Reviewed: Basic Metabolic Panel:  Lab 09/08/12 1610 09/07/12 0540 09/06/12 0525 09/06/12 0500 09/05/12 0457 09/04/12 0454  NA 136 135 -- 136 136 137  K 3.3* 3.6 -- 3.4* 3.9 3.4*  CL 88* 86* -- 84* 83* 84*  CO2 44* >45* -- >45* >45* >45*  GLUCOSE 191* 232* -- 114* 168* 229*  BUN 68* 73* -- 79* 83* 86*  CREATININE 1.53* 1.58* -- 1.71* 1.82* 1.86*  CALCIUM 9.7 9.6 -- 9.9 9.9 9.7  MG -- -- -- -- -- --  PHOS 3.2 3.4 3.8 -- 4.1 3.6   Liver Function Tests:  Lab 09/08/12 0345 09/07/12 0540 09/06/12 0525 09/05/12 0457 09/04/12 0454  AST -- -- -- -- --  ALT -- -- -- -- --  ALKPHOS -- -- -- -- --  BILITOT -- -- -- -- --  PROT -- -- -- -- --  ALBUMIN 2.8* 2.9* 3.0* 3.0* 2.9*   No results found for this basename: LIPASE:5,AMYLASE:5 in the last 168 hours No results found for this basename: AMMONIA:5 in the last 168 hours CBC:  Lab 09/08/12 0348 09/07/12 0540 09/06/12 0500 09/05/12 0457 09/04/12 0454  WBC 7.5 8.0 9.0 8.6 6.8  NEUTROABS -- -- -- -- --  HGB 8.0* 7.9* 8.9* 8.6* 8.6*  HCT 27.0* 26.2* 30.2* 28.4* 28.2*  MCV 89.7 88.2 88.8 87.9 87.6  PLT 242 231 232 221 171   Cardiac Enzymes: No results found for this basename: CKTOTAL:5,CKMB:5,CKMBINDEX:5,TROPONINI:5 in the last 168 hours BNP (last 3 results) No results found for this basename: PROBNP:3 in the last 8760 hours CBG:  Lab 09/08/12 1544 09/08/12 1234 09/08/12 0809 09/07/12 2217 09/07/12 1637  GLUCAP 144* 156* 149* 209* 99    Recent Results (from the past 240 hour(s))  URINE CULTURE     Status: Normal (Preliminary result)   Collection Time    09/04/12 10:55 PM      Component Value Range Status Comment   Specimen Description URINE, CATHETERIZED   Final    Special Requests NONE   Final    Culture  Setup Time 09/05/2012 07:07   Final    Colony Count >=100,000 COLONIES/ML   Final    Culture     Final    Value: ESCHERICHIA COLI     GRAM NEGATIVE RODS   Report Status PENDING   Incomplete      Studies: Dg Chest 1 View  08/27/2012  *RADIOLOGY REPORT*  Clinical Data: Post fall, history of atrial fibrillation  CHEST - 1 VIEW  Comparison: 07/26/2012; 07/25/2012; 07/18/2012  Findings:  Grossly unchanged enlarged cardiac silhouette and mediastinal contours.  Interval development of a moderate-sized right-sided pleural effusion.  Pulmonary vasculature remains indistinct with cephalization of flow.  No definite left-sided pleural effusion. No definite pneumothorax.  Calcification overlying the left lower neck may be within the left lobe of the thyroid.  No  definite acute osseous abnormalities.  IMPRESSION: 1.  Interval development of a moderate-sized right-sided pleural effusion with persistent findings of mild pulmonary edema.  Further evaluation with a PA and lateral chest radiograph may be obtained as clinically indicated. 2.  Possible calcification within the left lobe of the thyroid. Further evaluation with thyroid ultrasound may be performed in the non emergent setting as clinically indicated   Original Report Authenticated By: Waynard Reeds, M.D.    Dg Femur Right  08/27/2012  *RADIOLOGY REPORT*  Clinical Data: Post fall, now with severe pain and tenderness involving the distal aspect of the femur  RIGHT FEMUR - 2 VIEW  Comparison: None.  Findings:  There is an oblique, minimally displaced, slightly impacted fracture of the distal femoral metaphysis.  There is no definitive extension of the fracture into the right total knee replacement hardware.  No definite evidence of hardware failure or loosening. No definite suprapatellar joint  effusion, though evaluation degraded due to technique.  No radiopaque foreign body.  IMPRESSION: Oblique, minimally displaced, slightly impacted fracture of the distal femoral metaphysis without definite extension to involve the femoral component of the right total knee replacement.   Original Report Authenticated By: Waynard Reeds, M.D.    Dg Tibia/fibula Left  08/27/2012  *RADIOLOGY REPORT*  Clinical Data: Post fall, now with leg pain  LEFT TIBIA AND FIBULA - 2 VIEW  Comparison: None.  Findings: There is an oblique, displaced fracture of the proximal tibial metaphysis with apparent extension to involve the tibial component of the left total knee hardware, which appears minimally angulated, apex ventral.  This is the adjacent soft tissue swelling.  No radiopaque foreign body.  Vascular calcifications.  IMPRESSION: Oblique, displaced fracture of the proximal tibial metaphysis with extension to involve the tibial component of the left total knee replacement hardware.   Original Report Authenticated By: Waynard Reeds, M.D.    Ct Head Wo Contrast  08/23/2012  *RADIOLOGY REPORT*  Clinical Data: Tremors and shaking  CT HEAD WITHOUT CONTRAST  Technique:  Contiguous axial images were obtained from the base of the skull through the vertex without contrast.  Comparison: 06/27/2012  Findings: Stable for mild age white matter hypodensity. There is prominence of the sulci and ventricles consistent with brain atrophy.  There is no evidence for acute brain infarct, hemorrhage or mass.  The paranasal sinuses and mastoid air cells are clear.  The skull appears intact.  IMPRESSION:  1.  No acute intracranial abnormalities.   Original Report Authenticated By: Rosealee Albee, M.D.    US Renal Port  08/30/2012  *RADIOLOGY REPORT*  Clinical Data: Acute renal failure  RENAL/URINARY TRACT ULTRASOUND COMPLETE  Comparison: CT 05/22/2012  Findings:  Right Kidney = 10.3 cm.  No hydronephrosis or mass.  A small simple cyst in  upper pole.  Left kidney = Not well visualized. Bladder:  Foley catheter placed in the  IMPRESSION:  Normal right kidney.  Left kidney is poorly assessed.   Original Report Authenticated By: Genevive Bi, M.D.    Dg Chest Port 1 View  09/01/2012  *RADIOLOGY REPORT*  Clinical Data: Pleural effusions.  PORTABLE CHEST - 1 VIEW  Comparison: Plain film chest 08/28/2012 and 08/30/2012.  Findings: Right worse than left pleural effusions and airspace disease do not appear changed compared to the 08/28/2012 study. There is cardiomegaly.  Right PICC remains in place.  IMPRESSION: No marked change in right worse than left effusions and airspace disease compared to the  08/28/2012 study.   Original Report Authenticated By: Bernadene Bell. Maricela Curet, M.D.    Dg Chest Port 1 View  08/30/2012  *RADIOLOGY REPORT*  Clinical Data: Fall pleural effusions  PORTABLE CHEST - 1 VIEW  Comparison: Chest radiograph 08/28/2012  Findings: Stable enlarged heart silhouette.  There is a right PICC line in place.  There is generalized central venous congestion and low lung volumes with small pleural effusions. Effusions are improved.  No pneumothorax.  IMPRESSION:  1.  Low lung volumes and pleural effusions with central venous congestion suggest congestive heart failure.  2.  Effusions are slightly improved.   Original Report Authenticated By: Genevive Bi, M.D.    Dg Chest Port 1 View  08/28/2012  *RADIOLOGY REPORT*  Clinical Data: Confirm the line placement.  Status post PICC placement.  PORTABLE CHEST - 1 VIEW  Comparison: 08/27/2012  Findings: The patient is slightly rotated to the left.  Right upper extremity PICC projects over the distal superior vena cava.  Its tip is approximately 3 cm inferior to the carina.  Cardiomegaly, pulmonary vascular congestion, and pulmonary edema persist.  There are bilateral pleural effusions, right greater than left.  No evidence of pneumothorax.  Coarse, chunky calcification in the left neck is  unchanged compared to cervical spine radiographs of 2002.  Question if this could be a calcification within the left thyroid lobe.  IMPRESSION:  1. The distal tip of the right upper extremity PICC projects over the distal superior vena cava, in satisfactory position. 2.  Congestive heart failure pattern with bilateral pleural effusions, right greater than left.   Original Report Authenticated By: Britta Mccreedy, M.D.     Scheduled Meds:    . acetaZOLAMIDE  500 mg Oral Q12H  . antiseptic oral rinse  15 mL Mouth Rinse q12n4p  . chlorhexidine  15 mL Mouth Rinse BID  . darbepoetin (ARANESP) injection - NON-DIALYSIS  100 mcg Subcutaneous Q Fri-1800  . diltiazem  90 mg Oral QID  . docusate sodium  100 mg Oral BID  . feeding supplement  30 mL Oral TID WC  . fluticasone  1 puff Inhalation BID  . insulin aspart  0-15 Units Subcutaneous TID WC  . insulin aspart  0-5 Units Subcutaneous QHS  . insulin glargine  15 Units Subcutaneous QHS  . isosorbide dinitrate  20 mg Oral TID  . levalbuterol  0.63 mg Nebulization BID  . lidocaine  2 patch Transdermal Q24H  . pantoprazole  40 mg Oral Daily  . polyethylene glycol  17 g Oral BID  . [COMPLETED] potassium chloride  40 mEq Oral Once  . sodium chloride  10-40 mL Intracatheter Q12H  . sotalol  80 mg Oral BID   Continuous Infusions:    . sodium chloride 20 mL/hr at 09/08/12 1226  . heparin 1,450 Units/hr (09/07/12 2305)    Principal Problem:  *Multiple fractures of both lower extremities Active Problems:  DM  OBSTRUCTIVE SLEEP APNEA  GERD  PAF (paroxysmal atrial fibrillation), was in SR 06/2012   HTN (hypertension)  COPD (chronic obstructive pulmonary disease)  Hyponatremia  CKD (chronic kidney disease)  Pleural effusion  Hypercapnemia  Acute respiratory failure  Chronic diastolic heart failure  Hypokalemia  Alkalosis, metabolic  Pain  UTI (lower urinary tract infection)

## 2012-09-09 ENCOUNTER — Inpatient Hospital Stay (HOSPITAL_COMMUNITY): Payer: Medicare Other

## 2012-09-09 DIAGNOSIS — N390 Urinary tract infection, site not specified: Secondary | ICD-10-CM

## 2012-09-09 LAB — BASIC METABOLIC PANEL
BUN: 51 mg/dL — ABNORMAL HIGH (ref 6–23)
Chloride: 93 mEq/L — ABNORMAL LOW (ref 96–112)
GFR calc Af Amer: 41 mL/min — ABNORMAL LOW (ref >90)
GFR calc non Af Amer: 36 mL/min — ABNORMAL LOW (ref >90)
Potassium: 2.9 mEq/L — ABNORMAL LOW (ref 3.5–5.1)
Sodium: 141 mEq/L (ref 135–145)

## 2012-09-09 LAB — RENAL FUNCTION PANEL
Albumin: 3.2 g/dL — ABNORMAL LOW (ref 3.5–5.2)
Chloride: 90 mEq/L — ABNORMAL LOW (ref 96–112)
GFR calc non Af Amer: 34 mL/min — ABNORMAL LOW (ref >90)
Phosphorus: 2.8 mg/dL (ref 2.3–4.6)
Potassium: 2.9 mEq/L — ABNORMAL LOW (ref 3.5–5.1)

## 2012-09-09 LAB — GLUCOSE, CAPILLARY
Glucose-Capillary: 131 mg/dL — ABNORMAL HIGH (ref 70–99)
Glucose-Capillary: 140 mg/dL — ABNORMAL HIGH (ref 70–99)
Glucose-Capillary: 140 mg/dL — ABNORMAL HIGH (ref 70–99)
Glucose-Capillary: 149 mg/dL — ABNORMAL HIGH (ref 70–99)
Glucose-Capillary: 155 mg/dL — ABNORMAL HIGH (ref 70–99)

## 2012-09-09 LAB — TROPONIN I: Troponin I: <0.3 ng/mL (ref <0.30)

## 2012-09-09 LAB — CBC
HCT: 34.2 % — ABNORMAL LOW (ref 36.0–46.0)
Hemoglobin: 10.8 g/dL — ABNORMAL LOW (ref 12.0–15.0)
MCH: 27.5 pg (ref 26.0–34.0)
Platelets: 288 10*3/uL (ref 150–400)
RBC: 3.78 MIL/uL — ABNORMAL LOW (ref 3.87–5.11)
RBC: 3.92 MIL/uL (ref 3.87–5.11)
RDW: 16.9 % — ABNORMAL HIGH (ref 11.5–15.5)
WBC: 11.3 10*3/uL — ABNORMAL HIGH (ref 4.0–10.5)
WBC: 11.8 10*3/uL — ABNORMAL HIGH (ref 4.0–10.5)

## 2012-09-09 LAB — LACTIC ACID, PLASMA: Lactic Acid, Venous: 1.1 mmol/L (ref 0.5–2.2)

## 2012-09-09 LAB — MAGNESIUM: Magnesium: 2.4 mg/dL (ref 1.5–2.5)

## 2012-09-09 LAB — CK TOTAL AND CKMB (NOT AT ARMC): Relative Index: INVALID (ref 0.0–2.5)

## 2012-09-09 LAB — HEPATIC FUNCTION PANEL
Albumin: 3.3 g/dL — ABNORMAL LOW (ref 3.5–5.2)
Total Bilirubin: 1.3 mg/dL — ABNORMAL HIGH (ref 0.3–1.2)
Total Protein: 7.1 g/dL (ref 6.0–8.3)

## 2012-09-09 LAB — HEPARIN LEVEL (UNFRACTIONATED)
Heparin Unfractionated: 0.21 IU/mL — ABNORMAL LOW (ref 0.30–0.70)
Heparin Unfractionated: 0.32 IU/mL (ref 0.30–0.70)

## 2012-09-09 MED ORDER — POTASSIUM CHLORIDE CRYS ER 20 MEQ PO TBCR: 40.0000 meq | EXTENDED_RELEASE_TABLET | Freq: Once | ORAL | Status: DC

## 2012-09-09 MED ORDER — POTASSIUM CHLORIDE 20 MEQ/15ML (10%) PO LIQD
40.0000 meq | ORAL | Status: AC
  Administered 2012-09-09 (×2): 40 meq
  Filled 2012-09-09: qty 30

## 2012-09-09 MED ORDER — DILTIAZEM HCL 100 MG IV SOLR
5.0000 mg/h | INTRAVENOUS | Status: DC
  Administered 2012-09-09: 10 mg/h via INTRAVENOUS
  Administered 2012-09-09 – 2012-09-10 (×2): 15 mg/h via INTRAVENOUS
  Filled 2012-09-09: qty 100

## 2012-09-09 MED ORDER — POTASSIUM CHLORIDE 10 MEQ/50ML IV SOLN
INTRAVENOUS | Status: AC
  Administered 2012-09-09: 10 meq via INTRAVENOUS
  Filled 2012-09-09: qty 50

## 2012-09-09 MED ORDER — MIDAZOLAM HCL 5 MG/ML IJ SOLN
1.0000 mg | INTRAMUSCULAR | Status: DC | PRN
  Administered 2012-09-09: 4 mg via INTRAVENOUS
  Administered 2012-09-09: 3 mg via INTRAVENOUS
  Administered 2012-09-10: 2 mg via INTRAVENOUS
  Filled 2012-09-09 (×2): qty 1

## 2012-09-09 MED ORDER — ACETAZOLAMIDE SODIUM 500 MG IJ SOLR
500.0000 mg | Freq: Every day | INTRAMUSCULAR | Status: AC
  Administered 2012-09-09 – 2012-09-10 (×2): 500 mg via INTRAVENOUS
  Filled 2012-09-09 (×2): qty 500

## 2012-09-09 MED ORDER — DILTIAZEM HCL 25 MG/5ML IV SOLN: 10.0000 mg | Freq: Once | INTRAVENOUS | Status: DC

## 2012-09-09 MED ORDER — FENTANYL CITRATE 0.05 MG/ML IJ SOLN
50.0000 ug | INTRAMUSCULAR | Status: DC | PRN
  Administered 2012-09-09 (×2): 100 ug via INTRAVENOUS
  Filled 2012-09-09 (×2): qty 2

## 2012-09-09 MED ORDER — HYDROMORPHONE HCL PF 1 MG/ML IJ SOLN
1.0000 mg | INTRAMUSCULAR | Status: DC | PRN
  Administered 2012-09-09 – 2012-09-10 (×4): 1 mg via INTRAVENOUS
  Filled 2012-09-09 (×5): qty 1

## 2012-09-09 MED ORDER — POTASSIUM CHLORIDE 10 MEQ/50ML IV SOLN
10.0000 meq | INTRAVENOUS | Status: AC
  Administered 2012-09-09 (×6): 10 meq via INTRAVENOUS
  Filled 2012-09-09: qty 250

## 2012-09-09 MED ORDER — HYDROMORPHONE HCL PF 1 MG/ML IJ SOLN
0.5000 mg | INTRAMUSCULAR | Status: DC | PRN
  Administered 2012-09-09: 0.5 mg via INTRAVENOUS
  Filled 2012-09-09: qty 1

## 2012-09-09 MED ORDER — METOPROLOL TARTRATE 1 MG/ML IV SOLN
2.5000 mg | Freq: Four times a day (QID) | INTRAVENOUS | Status: DC
  Administered 2012-09-09 – 2012-09-11 (×10): 2.5 mg via INTRAVENOUS
  Administered 2012-09-12: 12:00:00 via INTRAVENOUS
  Administered 2012-09-12 (×2): 2.5 mg via INTRAVENOUS
  Filled 2012-09-09 (×16): qty 5

## 2012-09-09 MED ORDER — CEFTRIAXONE SODIUM 1 G IJ SOLR
1.0000 g | INTRAMUSCULAR | Status: DC
  Administered 2012-09-09 – 2012-09-15 (×7): 1 g via INTRAVENOUS
  Filled 2012-09-09 (×7): qty 10

## 2012-09-09 MED ORDER — PANTOPRAZOLE SODIUM 40 MG IV SOLR
40.0000 mg | Freq: Every day | INTRAVENOUS | Status: DC
  Administered 2012-09-09 – 2012-09-12 (×4): 40 mg via INTRAVENOUS
  Filled 2012-09-09 (×5): qty 40

## 2012-09-09 MED ORDER — HYDROMORPHONE HCL PF 1 MG/ML IJ SOLN
1.0000 mg | Freq: Once | INTRAMUSCULAR | Status: AC
  Administered 2012-09-09: 1 mg via INTRAVENOUS
  Filled 2012-09-09: qty 1

## 2012-09-09 MED ORDER — ACETAZOLAMIDE SODIUM 500 MG IJ SOLR: 500.0000 mg | Freq: Once | INTRAMUSCULAR | Status: DC

## 2012-09-09 MED ORDER — DILTIAZEM LOAD VIA INFUSION
10.0000 mg | Freq: Once | INTRAVENOUS | Status: AC
  Administered 2012-09-09: 10 mg via INTRAVENOUS
  Filled 2012-09-09: qty 10

## 2012-09-09 NOTE — Progress Notes (Signed)
This is a 78 year old female w/ multiple co-morbids including: CRF stage IV,PAF on coumadin, OSA (wears CPAP at HS). Recently transferred to SNF after ~ 2 week stay at [facility] for acute on chronic renal failure. Had unwitnessed fall on 10/24 resulting in BLE fractures. Admitted by medical service rx has included pain management, IV lasix gtt for volume overload and ortho consult. PCCM asked to see on 10/28 for AMS and concern about hypercarbia.   Now in A fib with rate 122  To 92 on IV heparin and and Dilt 15  POST OP DAY 1 OPEN REDUCTION INTERNAL FIXATION (ORIF) PERIPROSTHETIC FRACTURE (Right) - ORIF periprosthetic right proximal femur fracture  2. Closed reduction, Spanning external fixator left proximal tibia  EXTERNAL FIXATION LEG (Left)   Subjective: Intubated on Vent and sedated  Objective: Vital signs in last 24 hours: Temp:  [98.7 F (37.1 C)-102.1 F (38.9 C)] 99.7 F (37.6 C) (11/06 1600) Pulse Rate:  [35-143] 92  (11/06 1738) Resp:  [14-27] 15  (11/06 1738) BP: (112-172)/(56-136) 112/56 mmHg (11/06 1738) SpO2:  [96 %-100 %] 98 % (11/06 1738) Arterial Line BP: (127-174)/(58-91) 143/73 mmHg (11/06 0600) FiO2 (%):  [30 %-100 %] 30 % (11/06 1738) Weight:  [104 kg (229 lb 4.5 oz)] 104 kg (229 lb 4.5 oz) (11/06 0442) Weight change:  Last BM Date: 09/09/12 Intake/Output from previous day: -1760 11/05 0701 - 11/06 0700 In: 2709.3 [P.O.:300; I.V.:1485.3; Blood:674; IV Piggyback:250] Out: 3690 [Urine:3440; Blood:250] Intake/Output this shift: Total I/O In: 610 [I.V.:410; IV Piggyback:200] Out: 1290 [Urine:1290]  PE: General:wakes when name called No JVD Heart:S1S2 irreg irreg 1/6 sem Lungs:clear ant. Abd:+ BS soft Ext: 1 - 2+ edema    Lab Results:  Basename 09/09/12 0500 09/09/12 0034  WBC 11.3* 11.8*  HGB 10.4* 10.8*  HCT 33.0* 34.2*  PLT 288 270   BMET  Basename 09/09/12 1300 09/09/12 0500  NA 141 137  K 2.9* 2.9*  CL 93* 90*  CO2 36* 37*  GLUCOSE 132*  155*  BUN 51* 56*  CREATININE 1.34* 1.39*  CALCIUM 9.8 9.9    Basename 09/09/12 0034  TROPONINI <0.30    Lab Results  Component Value Date   CHOL 96 08/24/2012   HDL 43 08/24/2012   LDLCALC 38 08/24/2012   TRIG 74 08/24/2012   CHOLHDL 2.2 08/24/2012   Lab Results  Component Value Date   HGBA1C 7.0* 09/04/2012     Lab Results  Component Value Date   TSH 1.492 08/27/2012    Hepatic Function Panel  Basename 09/09/12 0500 09/09/12 0034  PROT -- 7.1  ALBUMIN 3.2* --  AST -- 22  ALT -- 8  ALKPHOS -- 134*  BILITOT -- 1.3*  BILIDIR -- 0.5*  IBILI -- 0.8    Studies/Results: Dg Knee 1-2 Views Left  09/08/2012  *RADIOLOGY REPORT*  Clinical Data: External fixation of proximal left tibia fracture. Prior left knee arthroplasty.  OPERATIVE LEFT KNEE - 1-2 VIEW  Comparison: Left tibia-fibula x-rays 08/27/2012.  Findings: 2 spot images from the C-arm fluoroscopic device, AP and lateral views of the left knee and proximal tib-fib, were submitted for interpretation postoperatively.  These demonstrate an external fixator device which has been placed distal to the tibial fracture.  IMPRESSION: External fixator device placed in the proximal left tibia, distal to the tibial fracture.   Original Report Authenticated By: Hulan Saas, M.D.    Dg Knee 1-2 Views Right  09/08/2012  *RADIOLOGY REPORT*  Clinical Data: ORIF distal  right femur fracture.  Prior right knee arthroplasty.  OPERATIVE RIGHT KNEE - 1-2 VIEW  Comparison: Right femur x-rays 08/27/2012.  Findings: 4 spot images from the C-arm fluoroscopic device, AP and lateral views of the distal right femur and knee, were submitted for interpretation post-operatively.  These demonstrate ORIF of the comminuted distal right femur fracture with plate and screw fixation.  Alignment appears near anatomic.  Prior right knee arthroplasty with anatomic alignment.  IMPRESSION: ORIF of the comminuted distal right femur fracture with plate and screw  fixation.   Original Report Authenticated By: Hulan Saas, M.D.    Dg Chest Port 1 View  09/09/2012  *RADIOLOGY REPORT*  Clinical Data: Evaluate endotracheal tube  PORTABLE CHEST - 1 VIEW  Comparison: 09/08/2012  Findings: Endotracheal tube tip 3.2 cm proximal to the carina. Cardiomegaly.  Aortic atherosclerosis.  Bilateral infrahilar/lung base opacities and layering pleural effusions.  No pneumothorax. No interval osseous change.  IMPRESSION: Endotracheal tube tip 3.2 cm proximal to the carina.  Cardiomegaly and bilateral airspace opacities are similar to prior, favor edema.  Bilateral pleural effusions with associated airspace opacities are similar to prior, favor atelectasis.   Original Report Authenticated By: Jearld Lesch, M.D.    Dg Chest Port 1 View  09/09/2012  *RADIOLOGY REPORT*  Clinical Data: Intubation.  Postop ORIF right distal femur fracture.  PORTABLE CHEST - 1 VIEW 09/08/2012 20 3:50 5 hours:  Comparison: Portable chest x-ray earlier same date 0446 hours.  Findings: Intubation with the endotracheal tube tip in satisfactory position approximately 4 cm above the carina.  Cardiac silhouette enlarged but stable.  Diffuse interstitial and airspace pulmonary edema improved since earlier in the day, though mild edema persists.  Small bilateral pleural effusions, unchanged.  No confluent airspace consolidation.  IMPRESSION:  1.  Endotracheal tube tip in satisfactory position approximately 4 cm above the carina. 2.  Improved CHF since earlier in the day, though mild interstitial pulmonary edema persists. 3.  Stable bilateral pleural effusions.   Original Report Authenticated By: Hulan Saas, M.D.    Dg Chest Port 1 View  09/08/2012  *RADIOLOGY REPORT*  Clinical Data: Infiltrates,effusions  PORTABLE CHEST - 1 VIEW  Comparison: 09/01/2012  Findings: The patient is rotated to the left on today's exam, resulting in reduced diagnostic sensitivity and specificity. Cardiomegaly is present with  continued vascular indistinctness, interstitial opacity, and patchy airspace opacities.  Underlying pleural effusions may contribute to the basilar confluence of opacity on this semi erect examination.  Low lung volumes are present, causing crowding of the pulmonary vasculature.   Right PICC line tip:  SVC.  IMPRESSION:  1.  Stable appearance of the chest.   Original Report Authenticated By: Gaylyn Rong, M.D.    Dg C-arm 1-60 Min-no Report  09/08/2012  CLINICAL DATA: surgery   C-ARM 1-60 MINUTES  Fluoroscopy was utilized by the requesting physician.  No radiographic  interpretation.     Dg C-arm 61-120 Min-no Report  09/08/2012  CLINICAL DATA: periprosthetic fracture right hip   C-ARM 61-120 MINUTES  Fluoroscopy was utilized by the requesting physician.  No radiographic  interpretation.      Medications: I have reviewed the patient's current medications.    Marland Kitchen acetaZOLAMIDE  500 mg Intravenous Daily  . antiseptic oral rinse  15 mL Mouth Rinse q12n4p  . [COMPLETED] ceFAZolin  2 g Intravenous Once  . [COMPLETED]  ceFAZolin (ANCEF) IV  2 g Intravenous Q6H  . cefTRIAXone (ROCEPHIN)  IV  1 g Intravenous Q24H  .  chlorhexidine  15 mL Mouth Rinse BID  . darbepoetin (ARANESP) injection - NON-DIALYSIS  100 mcg Subcutaneous Q Fri-1800  . [COMPLETED] diltiazem  10 mg Intravenous Once  . feeding supplement  30 mL Oral TID WC  . fluticasone  1 puff Inhalation BID  . insulin aspart  2-6 Units Subcutaneous Q4H  . levalbuterol  0.63 mg Nebulization BID  . lidocaine  2 patch Transdermal Q24H  . metoprolol  2.5 mg Intravenous Q6H  . [COMPLETED] midazolam      . pantoprazole (PROTONIX) IV  40 mg Intravenous Daily  . [COMPLETED] potassium chloride  10 mEq Intravenous Q1 Hr x 6  . potassium chloride  40 mEq Per Tube Q4H  . sodium chloride  10-40 mL Intracatheter Q12H  . [DISCONTINUED] acetaZOLAMIDE  500 mg Oral Q12H  . [DISCONTINUED] acetaZOLAMIDE  500 mg Intravenous Once  . [DISCONTINUED] diltiazem   10 mg Intravenous Once  . [DISCONTINUED] diltiazem  90 mg Oral QID  . [DISCONTINUED] docusate sodium  100 mg Oral BID  . [DISCONTINUED] insulin aspart  0-15 Units Subcutaneous TID WC  . [DISCONTINUED] insulin aspart  0-5 Units Subcutaneous QHS  . [DISCONTINUED] insulin glargine  15 Units Subcutaneous QHS  . [DISCONTINUED] isosorbide dinitrate  20 mg Oral TID  . [DISCONTINUED] pantoprazole  40 mg Oral Daily  . [DISCONTINUED] polyethylene glycol  17 g Oral BID  . [DISCONTINUED] potassium chloride  40 mEq Oral Once  . [DISCONTINUED] potassium chloride  40 mEq Oral Once  . [DISCONTINUED] sotalol  80 mg Oral BID   Assessment/Plan: Principal Problem:  *Multiple fractures of both lower extremities Active Problems:  DM  OBSTRUCTIVE SLEEP APNEA  GERD  PAF (paroxysmal atrial fibrillation), was in SR 06/2012   HTN (hypertension)  COPD (chronic obstructive pulmonary disease)  Hyponatremia  CKD (chronic kidney disease)  Pleural effusion  Hypercapnemia  Acute respiratory failure  Chronic diastolic heart failure  Hypokalemia  Alkalosis, metabolic  Pain  UTI (lower urinary tract infection)  PLAN: Hypokalemic has rec'd 6 runs of K+ Dilt at 15, IV lopressor 2.5 mg every 6 hours.    HR from 78 to 122 during this exam. BP 112/ 56. If BP stable we could increase Lopressor to every 4 hours if needed for increased HR.  LOS: 13 days   INGOLD,LAURA R 09/09/2012, 5:49 PM   Patient seen and examined. Agree with assessment and plan. AF rate now 90 - 110. Will increase frequency of IV lopressor to 2.5 mg every 4 hours. K being repleted; will recheck K and Mg in am with possible extubation tomorrow.   Lennette Bihari, MD, [facility] 09/09/2012 6:30 PM

## 2012-09-09 NOTE — Progress Notes (Signed)
eLink Physician-Brief Progress Note Patient Name: Gina Ashley DOB: 1927/04/01 MRN: [id number]  Date of Service  09/09/2012   HPI/Events of Note  Continued pain med need. Current schduled not enough. Patient apparently on scheduled opioids at home  eICU Interventions  Increase versed dose range Increase fentanyl frequency range monitor   Intervention Category Intermediate Interventions: Other:  Afsheen Antony 09/09/2012, 3:54 AM

## 2012-09-09 NOTE — Progress Notes (Signed)
eLink Physician-Brief Progress Note Patient Name: Gina Ashley DOB: [birth year] MRN: [id number]  Date of Service  09/09/2012   HPI/Events of Note   Lab 09/09/12 0034 09/08/12 0345 09/07/12 0540 09/06/12 0525 09/06/12 0500 09/05/12 0457 09/04/12 0454  NA 137 136 135 -- 136 136 --  K 2.7* 3.3* -- -- -- -- --  CL 89* 88* 86* -- 84* 83* --  CO2 39* 44* >45* -- >45* >45* --  GLUCOSE 147* 191* 232* -- 114* 168* --  BUN 58* 68* 73* -- 79* 83* --  CREATININE 1.37* 1.53* 1.58* -- 1.71* 1.82* --  CALCIUM 9.7 9.7 9.6 -- 9.9 9.9 --  MG 2.6* -- -- -- -- -- --  PHOS -- 3.2 3.4 3.8 -- 4.1 3.6     eICU Interventions  Hypokalemia  Plan x 6 runs kcl   Intervention Category Major Interventions: Electrolyte abnormality - evaluation and management  Gina Ashley 09/09/2012, 1:48 AM

## 2012-09-09 NOTE — Progress Notes (Signed)
   Subjective: 1 Day Post-Op Procedure(s) (LRB): OPEN REDUCTION INTERNAL FIXATION (ORIF) PERIPROSTHETIC FRACTURE (Right) EXTERNAL FIXATION LEG (Left)   Patient is intubated at this time and unable to respond.  Objective:   VITALS:   Filed Vitals:   09/09/12 0900  BP: 146/84  Pulse: 143  Temp: 101 F (38.3 C)   Resp: 19    Incision: dressing C/D/I No cellulitis present Compartment soft  LABS  Basename 09/09/12 0500 09/09/12 0034 09/08/12 0348  HGB 10.4* 10.8* 8.0*  HCT 33.0* 34.2* 27.0*  WBC 11.3* 11.8* 7.5  PLT 288 270 242     Basename 09/09/12 0500 09/09/12 0034 09/08/12 0345  NA 137 137 136  K 2.9* 2.7* 3.3*  BUN 56* 58* 68*  CREATININE 1.39* 1.37* 1.53*  GLUCOSE 155* 147* 191*     Assessment/Plan: 1 Day Post-Op Procedure(s) (LRB): OPEN REDUCTION INTERNAL FIXATION (ORIF) PERIPROSTHETIC FRACTURE (Right) EXTERNAL FIXATION LEG (Left)  Medical management per medicine Eventually when she can get up, NWB bilateral LE Dressings looked good. Change / reinforce as needed.   Anastasio Auerbach Justyna Timoney   PAC  09/09/2012, 10:40 AM

## 2012-09-09 NOTE — Op Note (Signed)
[name], [name] NO.:  [id number]  MEDICAL RECORD NO.:  [id number]  LOCATION:  1227                         FACILITY:  [facility]  PHYSICIAN:  Madlyn Frankel. Charlann Boxer, M.D.  DATE OF BIRTH:  04-03-1927  DATE OF PROCEDURE:  09/08/2012 DATE OF DISCHARGE:                              OPERATIVE REPORT   PREOPERATIVE DIAGNOSES: 1. Right periprosthetic distal femur fracture. 2. Left periprosthetic proximal tibia fracture.  POSTOPERATIVE DIAGNOSES: 1. Right periprosthetic distal femur fracture. 2. Left periprosthetic proximal tibia fracture.  PROCEDURES: 1. Open reduction and internal fixation of right distal femur fracture     utilizing a 9-hole Biomet Polyax screws and plate. 2. Closed reduction and application of a spanning external fixator to     the left lower extremity for treatment and management of the left     proximal tibia fracture.  SURGEON:  Madlyn Frankel. Charlann Boxer, MD  ASSISTANTS:  Ollen Gross, M.D. and Lanney Gins, [place].  Please note that Mr. Carmon Sails was present for the entirety of the case, utilized from perioperative positioning, preoperative prepping and draping, management of the operative extremity, particularly on the right side, as well as primary wound closure.  Dr. Lequita Halt was present and available for assistance and management of the left lower extremity.  The plan reviewed with family and discussed was that of my role.  Dr. Lequita Halt was helpful and nice enough to participate and assist during this case, facilitated based on her age and medical comorbidities.  ANESTHESIA:  General.  SPECIMENS:  None.  COMPLICATIONS:  None apparent.  ESTIMATED BLOOD LOSS:  About 250 mL.  FLUIDS:  Use of pre and perioperative blood.  I believe that she received 2 units of packed red blood cells.  DRAINS:  None.  INDICATIONS FOR PROCEDURE:  [name] is an 78 year old female with multiple long-standing chronic medical comorbidities.  She unfortunately had  a ground level fall, sustained injuries to bilateral lower extremities as noted above.  She was admitted to the hospital and required intensive care unit management preoperatively in order to stabilize multiple organs including her lungs, kidneys, and heart. After an extensive discussion with both medical staff and Orthopedics, risks were discussed, particularly related to her comorbidities as they relate to complete lack of function and bedrest.  The family and the patient wished at this point to proceed with planned procedures, understanding risks and consented for procedure.  Preoperatively, it was discussed that she would need to be intubated and that she would remain intubated postoperatively, and hopefully wean from her ventilator over a 1-2-day period of time despite being in a, preoperatively stated, do not resuscitate the patient.  PROCEDURE IN DETAIL:  The patient was brought to the operative theater. Once adequate anesthesia, preoperative antibiotics, Ancef 2 g were administered, the patient was positioned supine on the flat Jackson fracture table.  The both lower extremities were then free draped out with 10/15 drapes, draping out each perineum.  We then pre-scrubbed each lower extremity with Betadine solution from the groin area to the mid leg.  Both lower extremity was then prepped and draped in a sterile fashion simultaneously with bilateral drapes.  A time-out was performed, identifying both  injuries, both extremities, both planned procedures as they were being carried out simultaneously.  On the right lower extremity, a lateral-based incision was made based off the distal femur.  The iliotibial band was then split and the vastus lateralis elevated anteriorly.  The fracture site was identified.  Under fluoroscopic guidance, I determined that using 9-hole plate may have been a little bit longer, I felt that this would help to span this area adequately.  The plate  was positioned laterally and then using the clamp, I was able to reduce the shaft of the femur to the plate utilizing a Cobb elevator to better reduce the fracture site distally.  Once I had confirmed anatomic reduction, I placed a single 8.0-mm screw in the distal segment of the plate and then once I had the plate oriented on the lateral aspect of the femur proximally, I placed a bicortical nonlocking screw which helped to further reduce the shaft of the bone to the plate.  At this point, it was a near anatomic reduced fracture.  At this point, I placed 3 more screws into the distal cloverleaf segment of the Polyax plate utilizing fluoroscopy to help lengthening the screws.  I then placed 3 other screws into the proximal plate.  It was a combination of locking and nonlocking screws.  Final radiographs obtained of the right side using fluoroscopy in AP and lateral planes.  I was happy with the overall reduction.  We irrigated this wound throughout the case and again at this point.  From the right wound standpoint, the iliotibial band was reapproximated using #1 Vicryl.  The remainder of wound was closed with 2-0 Vicryl and staples.  At the conclusion of both cases, the right wound was cleaned, dried, and dressed sterilely with Xeroform and gauze and ABD and tape.  The procedure for left lower extremity was carried out with closed reduction of the proximal tibia.  This was carried out predominantly in the AP view, noting there was some flexion of fracture at the proximal segment.  It did not appear to be severely displaced enough to warrant open procedure and then based on the very proximal nature, it did not appear to be obviously amenable to plate fixation.  This is particularly related to the tibial tray hardware.  Once the fracture was reduced, two 5.2-mm pins were placed into the femur and two into the tibia.  Two pin-to-bar clamps were then used on each of these 2 parallel  pins and a bar placed between them.  A third bar was used to connect the proximal part of the distal bar.  With the fracture in a reduced position, all clamps were tightened down to a snug position.  At this point, the fracture site was noted to be nonmobile.  Once this was done, I did make some relief incisions on the skin on the proximal pins to prevent excessive skin pressure and reapproximating portion of the wound with 2-0 Vicryl.  These wounds were cleaned, dried, dressed sterilely as was the frame.  The Xeroform gauze was placed around the pin sites.  Gauze was then placed between them, wrapped with Kerlix and an Ace wrap.  Following both of these procedures, the patient then remained hemodynamically stable and was transferred to the intensive care unit, intubated as previously planned and noted and discussed.  At this point, she will remain nonweightbearing for up to 6-8 weeks until we get some fracture union before we can allow her to  transfer most likely  on the right side.  We will need to follow her wounds on the right, on the left, external fixator very closely to make certain that these remain stable without any complications.  We will be following her in the hospital.  We will then subsequently follow her in an outpatient setting until we were confident of fracture union.  Questions were encouraged.  Procedure was reviewed with the family postoperatively.     Madlyn Frankel Charlann Boxer, M.D.     MDO/MEDQ  D:  09/08/2012  T:  09/09/2012  Job:  [id number]

## 2012-09-09 NOTE — Progress Notes (Signed)
Pharmacy Brief Note  A: Heparin level 0.32 on current heparin infusion of 1450units/hr. No bleeding reported/documented.  P: Cont same and f/u heparin level in the AM.  Charolotte Eke, PharmD, pager [phone]. 09/09/2012,5:00 PM.

## 2012-09-09 NOTE — Anesthesia Postprocedure Evaluation (Signed)
  Anesthesia Post-op Note  Patient: Gina Ashley  Procedure(s) Performed: Procedure(s) (LRB): OPEN REDUCTION INTERNAL FIXATION (ORIF) PERIPROSTHETIC FRACTURE (Right) EXTERNAL FIXATION LEG (Left)  Patient Location: PACU  Anesthesia Type: General  Level of Consciousness: sedated on ventilator  Airway and Oxygen Therapy: intubated and ventilated per anesthesia plan   Post-op Pain: no obvious  Post-op Assessment: Post-op Vital signs reviewed, Patient's Cardiovascular Status Stable, Respiratory Function Stable  Post-op Vital Signs: stable  Complications: No apparent anesthesia complications. To ICU intubated per anesthesia plan due to extreme CO2 retention. PCCM to resume care. Good Urine Output.

## 2012-09-09 NOTE — Progress Notes (Addendum)
Name: Gina Ashley MRN: [id number] DOB: [birth year]    LOS: 13  Referring Provider:  Rito Ehrlich Reason for Referral:  AMS  PULMONARY / CRITICAL CARE MEDICINE  Brief patient description:  This is a 78 year old female w/ multiple co-morbids including: CRF stage IV, AF on coumadin, OSA (wears CPAP at HS). Recently transferred to SNF after ~ 2 week stay at [facility] for acute on chronic renal failure. Had unwitnessed fall on 10/24 resulting in BLE fractures. Admitted by medical service rx has included pain management, IV lasix gtt for volume overload and ortho consult. PCCM asked to see on 10/28 for AMS and concern about hypercarbia.   Subjective: Intubated and sedated  Events since admission: 11/5>>>OR for ORIF of BLE fractures, remained intubated  Current Status: Intubated  Vital Signs: Temp:  [97.8 F (36.6 C)-101 F (38.3 C)] 101 F (38.3 C) (11/06 0800) Pulse Rate:  [35-143] 143  (11/06 0900) Resp:  [10-27] 19  (11/06 0900) BP: (112-172)/(49-136) 146/84 mmHg (11/06 0800) SpO2:  [94 %-100 %] 100 % (11/06 0900) Arterial Line BP: (127-174)/(58-91) 143/73 mmHg (11/06 0600) FiO2 (%):  [30 %-100 %] 30 % (11/06 0900) Weight:  [104 kg (229 lb 4.5 oz)] 104 kg (229 lb 4.5 oz) (11/06 0442)  Physical Examination: General:  Chronically ill appearing 78 year old female. Intubated Neuro: Sedated, but arousable HEENT: + JVD, MM dry,  ETT in place Cardiovascular:  Irregular rate, rapid rythym, pulses palpable Lungs:  Decreased bases.  Clear upper lobes Abdomen:  + bowel sounds, soft nontender Musculoskeletal:  Lower extremity pain bilatterally pain Skin:  Dressings to bilateral lower ext  Principal Problem:  *Multiple fractures of both lower extremities Active Problems:  DM  OBSTRUCTIVE SLEEP APNEA  GERD  PAF (paroxysmal atrial fibrillation), was in SR 06/2012   HTN (hypertension)  COPD (chronic obstructive pulmonary disease)  Hyponatremia  CKD (chronic kidney disease)  Pleural  effusion  Hypercapnemia  Acute respiratory failure  Chronic diastolic heart failure  Hypokalemia  Alkalosis, metabolic  Pain  UTI (lower urinary tract infection)   ASSESSMENT AND PLAN  PULMONARY  Lab 09/08/12 2334 09/08/12 0939 09/08/12 0345  PHART 7.438 7.391 7.408  PCO2ART 50.3* 72.6* 72.5*  PO2ART 390.0* 99.0 99.3  HCO3 33.4* 43.1* 45.1*  O2SAT 100.0 99.3 99.7   Ventilator Settings: Vent Mode:  [-] PRVC FiO2 (%):  [30 %-100 %] 30 % Set Rate:  [14 bmp] 14 bmp Vt Set:  [420 mL] 420 mL PEEP:  [5 cmH20] 5 cmH20 Plateau Pressure:  [18 cmH20-19 cmH20] 19 cmH20 CXR:  bilateral edema R>L  ETT:  11/05  A:   Acute Hypercarbic Respiratory Failure: in setting of narcotic effect superimposed on underlying OSA and CRF:  needs long term BIPAP,  Bilateral atelectasis Pulmonary edema/volume excess +/- element of effusion  Now on vent after surgery. Not ready to wean due to pain needs and RVR  P:   Wean as tolerated once heart rate controlled - SBT this afternoon Narcotics with caution Flovent + Xopenex Restart HS BiPAP once extubated Respiratory hygiene  CARDIOVASCULAR  Lab 09/09/12 0035 09/09/12 0034  TROPONINI -- <0.30  LATICACIDVEN 1.1 --  PROBNP -- --   ECG:  aflutter Lines: PICC 10/25 ECHO 10/21: estimated ejection fraction was in the range of 55% to 60%. Wall motion was normal; there were no regional wall motion abnormalities. The study is not technically sufficient to allow evaluation of LV diastolic function. A:  AFib/Flutter with RVR - oral meds were held for  over 12 hours prior to surgery HTN Pulmonary edema  Probable secondary PAH  P:  Diltiazem bolus Diltiazem drip Metoprolol Q6H Heparin IV Will restart oral medications once extubated  RENAL  Lab 09/09/12 0500 09/09/12 0034 09/08/12 0345 09/07/12 0540 09/06/12 0525 09/06/12 0500 09/05/12 0457  NA 137 137 136 135 -- 136 --  K 2.9* 2.7* -- -- -- -- --  CL 90* 89* 88* 86* -- 84* --  CO2 37* 39* 44*  >45* -- >45* --  BUN 56* 58* 68* 73* -- 79* --  CREATININE 1.39* 1.37* 1.53* 1.58* -- 1.71* --  CALCIUM 9.9 9.7 9.7 9.6 -- 9.9 --  MG -- 2.6* -- -- -- -- --  PHOS 2.8 -- 3.2 3.4 3.8 -- 4.1   Intake/Output      11/05 0701 - 11/06 0700 11/06 0701 - 11/07 0700   P.O. 300    I.V. (mL/kg) 1470.8 (14.1)    Blood 674    IV Piggyback 250 50   Total Intake(mL/kg) 2694.8 (25.9) 50 (0.5)   Urine (mL/kg/hr) 3440 (1.4)    Blood 250    Total Output 3690    Net -995.2 +50        Stool Occurrence 2 x     Foley:  10/24 Renal US 10/27: right kidney WNL, left not well assessed.   A:   Acute on Chronic renal failure. - Responded well to diamox and Cr has returned to baseline level of 1.3 Hypokalemia   P:   Replaced K, recheck BMP at noon Avoid nephrotoxic medications Renal adjust meds Diamox switched to IV  GASTROINTESTINAL  Lab 09/09/12 0500 09/09/12 0034 09/08/12 0345 09/07/12 0540 09/06/12 0525  AST -- 22 -- -- --  ALT -- 8 -- -- --  ALKPHOS -- 134* -- -- --  BILITOT -- 1.3* -- -- --  PROT -- 7.1 -- -- --  ALBUMIN 3.2* 3.3* 2.8* 2.9* 3.0*    A:   Obesity Malnutrition  Has been acutely on chronically ill for several weeks now.  P:   Medications switched to IV NPO for now, will start enteral feedings tomorrow if unable to wean today  HEMATOLOGIC  Lab 09/09/12 0500 09/09/12 0034 09/08/12 0348 09/07/12 0540 09/06/12 0500 09/03/12 0455  HGB 10.4* 10.8* 8.0* 7.9* 8.9* --  HCT 33.0* 34.2* 27.0* 26.2* 30.2* --  PLT 288 270 242 231 232 --  INR -- -- -- 1.18 -- 1.41  APTT -- -- -- -- -- --   A:   Anemia of Chronic disease.  Coumadin induced coagulopathy - currently on Heparin drip Got 1 unit PRBC on 10/25. No evidence of active bleeding.  P:  Trend CBC, and coags Trigger to transfuse < 7 PPI  .   INFECTIOUS  Lab 09/09/12 0500 09/09/12 0034 09/08/12 0348 09/07/12 0540 09/06/12 0500  WBC 11.3* 11.8* 7.5 8.0 9.0  PROCALCITON -- -- -- -- --   Cultures: Urine 11/1>>>E  coli, Klebsiella Blood x2 11/06>>>  Antibiotics: Ancef 11/05>>>11/06 Rocephin 11/06>>>  A: UTI - sensitive to Rocephin  Post operative infection risk - receiving Ancef postoperativley P:   Send blood cultures now ABX as above after cultures Trend WBC   ENDOCRINE  Lab 09/09/12 0746 09/09/12 0417 09/09/12 0024 09/08/12 2055 09/08/12 1544  GLUCAP 155*155* 151* 140*140* 111* 144*   A:   DM w/ hyperglycemia P:   Cont CBG monitoring and SSI   NEUROLOGIC  A:  Acute Encephalopathy (recurrent), initially this was due to  hypercarbia. Now due to narcotics. Family states has had this problem w. Hydrocodone in past. Interestingly did ok w/ dilaudid.  Postoperative pain Chronic Pain  P:   Supportive care Versed PRN Minimal narcotics, but needs adequate pain control.  Will use LOW dose dilaudid  Summary  Developed Afib/AFlutter postop most likely because all her cardiac medications were held for most of the day yesterday. Started scheduled metoprolol, bolused Cardizem, and started infusion. Remains intubated postoperatively.  UTI also noted, starting rocephin. Need to control heart rate and pain prior to attempting extubation.  On Ancef empirically postop, this will be stopped.   BEST PRACTICE / DISPOSITION Level of Care:  ICU Primary Service:  triad Consultants:  PCCM Code Status:  Full-->DNR Diet: NPO   DVT Px:  Heparin gtt GI Px:  PPI Skin Integrity:  intact Social / Family:  Updated.   30 minutes CC time  Levy Pupa, MD, PhD 09/09/2012, 9:24 AM [facility] [phone] or if no answer [phone]

## 2012-09-09 NOTE — Progress Notes (Signed)
ANTICOAGULATION CONSULT NOTE - Follow Up Consult  Pharmacy Consult for Heparin Indication: atrial fibrillation  Allergies  Allergen Reactions  . Morphine Sulfate Other (See Comments)    REACTION: change in personality  . Remeron (Mirtazapine) Other (See Comments)    Altered mental status, lethargy  . Tuna (Fish Allergy)   . Penicillins Rash    Tolerates Ancef.    Patient Measurements: Height: 5\' 3"  (160 cm) Weight: 229 lb 4.5 oz (104 kg) IBW/kg (Calculated) : 52.4   Vital Signs: Temp: 102.1 F (38.9 C) (11/06 1200) Temp src: Axillary (11/06 1200) BP: 158/118 mmHg (11/06 1200) Pulse Rate: 123  (11/06 1200)  Labs:  Basename 09/09/12 1300 09/09/12 0630 09/09/12 0500 09/09/12 0034 09/08/12 1000 09/08/12 0348 09/07/12 0540  HGB -- -- 10.4* 10.8* -- -- --  HCT -- -- 33.0* 34.2* -- 27.0* --  PLT -- -- 288 270 -- 242 --  APTT -- -- -- -- -- -- --  LABPROT -- -- -- -- -- -- 14.8  INR -- -- -- -- -- -- 1.18  HEPARINUNFRC -- 0.21* -- -- 0.47 0.14* --  CREATININE 1.34* -- 1.39* 1.37* -- -- --  CKTOTAL -- -- -- 62 -- -- --  CKMB -- -- -- 1.9 -- -- --  TROPONINI -- -- -- <0.30 -- -- --    Estimated Creatinine Clearance: 36.7 ml/min (by C-G formula based on Cr of 1.34).   Medications:  Infusions:     . sodium chloride Stopped (09/09/12 0156)  . diltiazem (CARDIZEM) infusion 15 mg/hr (09/09/12 1242)  . heparin 1,450 Units/hr (09/07/12 2305)    Assessment:  80 YOF with a history of A.fib on chronic Coumadin. Admitted on 10/24 with b/l LE fractures s/p fall. Coumadin placed on hold and pt being bridged with IV heparin; held for surgery 11/5.  Now, POD#1 ORIF.   Heparin was restarted postop but time of restart is unknown.  First shift RN today said that heparin had been running since she started her shift this AM and night shift RN did not chart restart.  Heparin was restarted at 1450 units/hr (previously therapeutic rate).  CBC ok postop.  No bleeding/complications  reported.  STAT HL ordered since has been at least 6 hours since restart of infusion.  Goal of Therapy:  Heparin level 0.3-0.7 units/ml Monitor platelets by anticoagulation protocol: Yes   Plan:   HL ordered to evaluate current infusion rate of 1450 units/hr.  Clance Boll, PharmD, BCPS Pager: [phone] 09/09/2012 2:27 PM

## 2012-09-09 NOTE — Progress Notes (Addendum)
CRITICAL VALUE ALERT  Critical value received:  K+ 2.7  Date of notification:  *09/09/12 **  Time of notification: 01:35  Critical value read back:yes  Nurse who received alert:  Dennison-Harwood  MD notified (1st page):  Ramaswamy  Time of first page:  01:40  MD notified (2nd page):  Time of second page:  Responding MD: Marchelle Gearing  Time MD responded:  01:45

## 2012-09-09 NOTE — Progress Notes (Signed)
Nutrition Follow-up  Intervention:  If unable to extubate, recommend begin TF with Promote at 20 ml/hr and increase 10 ml every 4 hours to goal of 40 ml/hr plus prostat 30 ml tid to provide:  1260 kcal, 105 gm protein,806 ml free water daily.  Assessment:   Pt intubated s/p 1 Day Post-Op Procedure(s) (LRB):  OPEN REDUCTION INTERNAL FIXATION (ORIF) PERIPROSTHETIC FRACTURE (Right)  EXTERNAL FIXATION LEG (Left)   Ate well prior to surgery.    Diet Order:  NPO  Meds: Scheduled Meds:   . acetaZOLAMIDE  500 mg Intravenous Daily  . antiseptic oral rinse  15 mL Mouth Rinse q12n4p  . [COMPLETED] ceFAZolin  2 g Intravenous Once  . [COMPLETED]  ceFAZolin (ANCEF) IV  2 g Intravenous Q6H  . cefTRIAXone (ROCEPHIN)  IV  1 g Intravenous Q24H  . chlorhexidine  15 mL Mouth Rinse BID  . darbepoetin (ARANESP) injection - NON-DIALYSIS  100 mcg Subcutaneous Q Fri-1800  . [COMPLETED] diltiazem  10 mg Intravenous Once  . feeding supplement  30 mL Oral TID WC  . fluticasone  1 puff Inhalation BID  . insulin aspart  2-6 Units Subcutaneous Q4H  . levalbuterol  0.63 mg Nebulization BID  . lidocaine  2 patch Transdermal Q24H  . metoprolol  2.5 mg Intravenous Q6H  . [COMPLETED] midazolam      . pantoprazole (PROTONIX) IV  40 mg Intravenous Daily  . [COMPLETED] potassium chloride  10 mEq Intravenous Q1 Hr x 6  . sodium chloride  10-40 mL Intracatheter Q12H  . [DISCONTINUED] acetaZOLAMIDE  500 mg Oral Q12H  . [DISCONTINUED] acetaZOLAMIDE  500 mg Intravenous Once  . [DISCONTINUED] diltiazem  10 mg Intravenous Once  . [DISCONTINUED] diltiazem  90 mg Oral QID  . [DISCONTINUED] docusate sodium  100 mg Oral BID  . [DISCONTINUED] insulin aspart  0-15 Units Subcutaneous TID WC  . [DISCONTINUED] insulin aspart  0-5 Units Subcutaneous QHS  . [DISCONTINUED] insulin glargine  15 Units Subcutaneous QHS  . [DISCONTINUED] isosorbide dinitrate  20 mg Oral TID  . [DISCONTINUED] pantoprazole  40 mg Oral Daily  .  [DISCONTINUED] polyethylene glycol  17 g Oral BID  . [DISCONTINUED] sotalol  80 mg Oral BID   Continuous Infusions:   . sodium chloride Stopped (09/09/12 0156)  . diltiazem (CARDIZEM) infusion 10 mg/hr (09/09/12 1013)  . heparin 1,450 Units/hr (09/07/12 2305)   PRN Meds:.bisacodyl, HYDROmorphone (DILAUDID) injection, levalbuterol, midazolam, sodium chloride, [DISCONTINUED] 0.9 % irrigation (POUR BTL), [DISCONTINUED] diphenhydrAMINE-zinc acetate, [DISCONTINUED] feeding supplement, [DISCONTINUED] fentaNYL, [DISCONTINUED] fentaNYL, [DISCONTINUED] HYDROcodone-acetaminophen, [DISCONTINUED]  HYDROmorphone (DILAUDID) injection, [DISCONTINUED] HYDROmorphone, [DISCONTINUED] metoprolol, [DISCONTINUED] midazolam [DISCONTINUED] ondansetron (ZOFRAN) IV, [DISCONTINUED] ondansetron, [DISCONTINUED] promethazine, [DISCONTINUED] traMADol   CMP     Component Value Date/Time   NA 137 09/09/2012 0500   K 2.9* 09/09/2012 0500   CL 90* 09/09/2012 0500   CO2 37* 09/09/2012 0500   GLUCOSE 155* 09/09/2012 0500   BUN 56* 09/09/2012 0500   CREATININE 1.39* 09/09/2012 0500   CALCIUM 9.9 09/09/2012 0500   PROT 7.1 09/09/2012 0034   ALBUMIN 3.2* 09/09/2012 0500   AST 22 09/09/2012 0034   ALT 8 09/09/2012 0034   ALKPHOS 134* 09/09/2012 0034   BILITOT 1.3* 09/09/2012 0034   GFRNONAA 34* 09/09/2012 0500   GFRAA 39* 09/09/2012 0500    CBG (last 3)   Basename 09/09/12 1123 09/09/12 0746 09/09/12 0417  GLUCAP 131* 155*155* 151*     Intake/Output Summary (Last 24 hours) at 09/09/12 1225 Last data filed at  09/09/12 1200  Gross per 24 hour  Intake 2569.34 ml  Output   3540 ml  Net -970.66 ml    Weight Status:  104 kg-decreasing  Re-estimated needs:  1785 kcal (1071-1250 kcal based on underfeeding protocol), 100-110 gm protein  Nutrition Dx:  Inadequate oral intake related to inability to eat AEB npo status  Goal:  Enteral nutrition to provide 60-70% of estimated calorie needs (22-25 kcals/kg ideal body weight) and  >/= 90% of estimated protein needs, based on ASPEN guidelines for permissive underfeeding in critically ill obese individuals.  Monitor:  Nutrition plan of care, vent status, labs, weight   Oran Rein, RD, LDN Clinical Inpatient Dietitian Pager:  [phone] Weekend and after hours pager:  [phone]

## 2012-09-09 NOTE — Clinical Social Work Note (Signed)
CSW met with daughter Eunice Blase to check in. She is "hanging by a thread" and is visibly worn out. CSW provided support. Daughter shared she wants a different rehab for Pt on discharge. CSW provided her with SNF list. CSW also mentioned [facility] or [facility] due to complex medical issues. Daughter is very interested in [facility] and CSW will share with RN CM.   Doreen Salvage, LCSW ICU/Stepdown Clinical Social Worker [facility] Cell [phone] Hours 8am-1200pm M-F

## 2012-09-10 ENCOUNTER — Encounter (HOSPITAL_COMMUNITY): Payer: Self-pay | Admitting: Orthopedic Surgery

## 2012-09-10 LAB — BASIC METABOLIC PANEL
BUN: 58 mg/dL — ABNORMAL HIGH (ref 6–23)
CO2: 35 mEq/L — ABNORMAL HIGH (ref 19–32)
CO2: 39 mEq/L — ABNORMAL HIGH (ref 19–32)
Chloride: 89 mEq/L — ABNORMAL LOW (ref 96–112)
GFR calc Af Amer: 40 mL/min — ABNORMAL LOW (ref >90)
Glucose, Bld: 147 mg/dL — ABNORMAL HIGH (ref 70–99)
Glucose, Bld: 150 mg/dL — ABNORMAL HIGH (ref 70–99)
Potassium: 2.7 mEq/L — CL (ref 3.5–5.1)
Potassium: 3.1 mEq/L — ABNORMAL LOW (ref 3.5–5.1)
Sodium: 142 mEq/L (ref 135–145)

## 2012-09-10 LAB — RENAL FUNCTION PANEL
CO2: 35 mEq/L — ABNORMAL HIGH (ref 19–32)
Calcium: 9.2 mg/dL (ref 8.4–10.5)
Creatinine, Ser: 1.36 mg/dL — ABNORMAL HIGH (ref 0.50–1.10)
GFR calc non Af Amer: 35 mL/min — ABNORMAL LOW (ref >90)

## 2012-09-10 LAB — CBC
HCT: 25.3 % — ABNORMAL LOW (ref 36.0–46.0)
Hemoglobin: 8.1 g/dL — ABNORMAL LOW (ref 12.0–15.0)
MCH: 27.3 pg (ref 26.0–34.0)
MCH: 27.6 pg (ref 26.0–34.0)
MCH: 27.3 pg (ref 26.0–34.0)
MCHC: 32 g/dL (ref 30.0–36.0)
MCV: 87.1 fL (ref 78.0–100.0)
MCV: 87.9 fL (ref 78.0–100.0)
Platelets: 255 10*3/uL (ref 150–400)
Platelets: 277 10*3/uL (ref 150–400)
RBC: 2.97 MIL/uL — ABNORMAL LOW (ref 3.87–5.11)
RDW: 17.5 % — ABNORMAL HIGH (ref 11.5–15.5)
RDW: 17.7 % — ABNORMAL HIGH (ref 11.5–15.5)

## 2012-09-10 LAB — GLUCOSE, CAPILLARY
Glucose-Capillary: 168 mg/dL — ABNORMAL HIGH (ref 70–99)
Glucose-Capillary: 172 mg/dL — ABNORMAL HIGH (ref 70–99)

## 2012-09-10 MED ORDER — HYDROCODONE-ACETAMINOPHEN 7.5-500 MG/15ML PO SOLN
10.0000 mL | ORAL | Status: DC | PRN
  Administered 2012-09-10 – 2012-09-11 (×4): 10 mL
  Filled 2012-09-10 (×4): qty 15

## 2012-09-10 MED ORDER — MIDAZOLAM HCL 5 MG/ML IJ SOLN
1.0000 mg | INTRAMUSCULAR | Status: DC | PRN
  Administered 2012-09-11: 1 mg via INTRAVENOUS
  Filled 2012-09-10: qty 1

## 2012-09-10 MED ORDER — POTASSIUM CHLORIDE 20 MEQ/15ML (10%) PO LIQD
40.0000 meq | Freq: Once | ORAL | Status: AC
  Administered 2012-09-10: 40 meq
  Filled 2012-09-10: qty 30

## 2012-09-10 MED ORDER — BIOTENE DRY MOUTH MT LIQD
15.0000 mL | Freq: Four times a day (QID) | OROMUCOSAL | Status: DC
  Administered 2012-09-10 – 2012-09-15 (×19): 15 mL via OROMUCOSAL

## 2012-09-10 NOTE — Progress Notes (Signed)
[facility]  Subjective: Intubated.  Asleep.  Objective: Vital signs in last 24 hours: Temp:  [98.4 F (36.9 C)-100.9 F (38.3 C)] 98.9 F (37.2 C) (11/07 1155) Pulse Rate:  [55-126] 66  (11/07 1600) Resp:  [14-22] 16  (11/07 1600) BP: (132)/(72) 132/72 mmHg (11/06 2057) SpO2:  [98 %-100 %] 99 % (11/07 1600) FiO2 (%):  [30 %] 30 % (11/07 1553) Last BM Date: 09/10/12  Intake/Output from previous day: 11/06 0701 - 11/07 0700 In: 1236 [I.V.:1006; NG/GT:30; IV Piggyback:200] Out: 2840 [Urine:2840] Intake/Output this shift: Total I/O In: 315 [I.V.:255; IV Piggyback:60] Out: 735 [Urine:735]  Medications Current Facility-Administered Medications  Medication Dose Route Frequency Provider Last Rate Last Dose  . 0.9 %  sodium chloride infusion   Intravenous Continuous Leslye Peer, MD 20 mL/hr at 09/10/12 1351    . [COMPLETED] acetaZOLAMIDE (DIAMOX) injection 500 mg  500 mg Intravenous Daily Richard A Rosebrock, Student-NP   500 mg at 09/10/12 0944  . antiseptic oral rinse (BIOTENE) solution 15 mL  15 mL Mouth Rinse QID Leslye Peer, MD   15 mL at 09/10/12 1611  . bisacodyl (DULCOLAX) suppository 10 mg  10 mg Rectal Daily PRN Lupita Leash, MD   10 mg at 09/05/12 2100  . cefTRIAXone (ROCEPHIN) 1 g in dextrose 5 % 50 mL IVPB  1 g Intravenous Q24H Maryanna Shape Runyon, PHARMD   1 g at 09/10/12 1350  . chlorhexidine (PERIDEX) 0.12 % solution 15 mL  15 mL Mouth Rinse BID Osvaldo Shipper, MD   15 mL at 09/10/12 0729  . darbepoetin (ARANESP) injection 100 mcg  100 mcg Subcutaneous Q Fri-1800 Irena Cords, MD   100 mcg at 09/04/12 1755  . diltiazem (CARDIZEM) 100 mg in dextrose 5 % 100 mL infusion  5-15 mg/hr Intravenous Continuous Richard A Rosebrock, Student-NP 5 mL/hr at 09/10/12 1400 5 mg/hr at 09/10/12 1400  . feeding supplement (PRO-STAT SUGAR FREE 64) liquid 30 mL  30 mL Oral TID WC Richard A Rosebrock, Student-NP   30 mL at 09/10/12 1611  .  fluticasone (FLOVENT HFA) 110 MCG/ACT inhaler 1 puff  1 puff Inhalation BID Osvaldo Shipper, MD   1 puff at 09/08/12 0937  . HYDROcodone-acetaminophen (LORTAB) 7.5-500 MG/15ML solution 10 mL  10 mL Per Tube Q4H PRN Bernadene Person, NP   10 mL at 09/10/12 1611  . [COMPLETED] HYDROmorphone (DILAUDID) injection 1 mg  1 mg Intravenous Once Lonia Farber, MD   1 mg at 09/09/12 1945  . insulin aspart (novoLOG) injection 2-6 Units  2-6 Units Subcutaneous Q4H Kalman Shan, MD   4 Units at 09/10/12 1611  . levalbuterol (XOPENEX) nebulizer solution 0.63 mg  0.63 mg Nebulization Q6H PRN Penny Pia, MD   0.63 mg at 09/07/12 1442  . levalbuterol (XOPENEX) nebulizer solution 0.63 mg  0.63 mg Nebulization BID Osvaldo Shipper, MD   0.63 mg at 09/10/12 0833  . lidocaine (LIDODERM) 5 % 2 patch  2 patch Transdermal Q24H Altha Harm, MD   2 patch at 09/10/12 0944  . metoprolol (LOPRESSOR) injection 2.5 mg  2.5 mg Intravenous Q6H Richard A Rosebrock, Student-NP   2.5 mg at 09/10/12 1111  . midazolam (VERSED) injection 1 mg  1 mg Intravenous Q2H PRN Bernadene Person, NP      . pantoprazole (PROTONIX) injection 40 mg  40 mg Intravenous Daily Richard A Rosebrock, Student-NP   40 mg at 09/10/12 0944  . [COMPLETED] potassium  chloride 20 MEQ/15ML (10%) liquid 40 mEq  40 mEq Per Tube Q4H Simonne Martinet, NP   40 mEq at 09/09/12 2000  . [COMPLETED] potassium chloride 20 MEQ/15ML (10%) liquid 40 mEq  40 mEq Per Tube Once Bernadene Person, NP   40 mEq at 09/10/12 0943  . sodium chloride 0.9 % injection 10-40 mL  10-40 mL Intracatheter Q12H Osvaldo Shipper, MD   10 mL at 09/10/12 0944  . sodium chloride 0.9 % injection 10-40 mL  10-40 mL Intracatheter PRN Osvaldo Shipper, MD      . [DISCONTINUED] antiseptic oral rinse (BIOTENE) solution 15 mL  15 mL Mouth Rinse q12n4p Osvaldo Shipper, MD   15 mL at 09/09/12 1659  . [DISCONTINUED] heparin ADULT infusion 100 units/mL (25000 units/250 mL)  1,450  Units/hr Intravenous Continuous Otho Bellows, PHARMD   1,450 Units/hr at 09/07/12 2305  . [DISCONTINUED] HYDROmorphone (DILAUDID) injection 1 mg  1 mg Intravenous Q2H PRN Richard A Rosebrock, Student-NP   1 mg at 09/10/12 1111  . [DISCONTINUED] midazolam (VERSED) injection 1-4 mg  1-4 mg Intravenous Q2H PRN Kalman Shan, MD   2 mg at 09/10/12 0445    PE: General appearance: Asleep Lungs: clear to auscultation bilaterally Heart: irregularly irregular rhythm Extremities: Trace LEE Pulses: 2+ and symmetric Skin: warm and dry  Lab Results:   Basename 09/10/12 1600 09/10/12 0501 09/10/12 0130  WBC 11.4* 11.4* 13.3*  HGB 8.1* 8.1* 8.7*  HCT 26.1* 25.3* 27.8*  PLT 277 255 255   BMET  Basename 09/10/12 0501 09/10/12 0500 09/09/12 1300  NA 142 143 141  K 3.1* 3.1* 2.9*  CL 98 99 93*  CO2 35* 35* 36*  GLUCOSE 150* 149* 132*  BUN 47* 47* 51*  CREATININE 1.33* 1.36* 1.34*  CALCIUM 9.2 9.2 9.8   09/09/12 PORTABLE CHEST - 1 VIEW  Comparison: 09/08/2012  Findings: Endotracheal tube tip 3.2 cm proximal to the carina.  Cardiomegaly. Aortic atherosclerosis. Bilateral infrahilar/lung  base opacities and layering pleural effusions. No pneumothorax.  No interval osseous change.  IMPRESSION:  Endotracheal tube tip 3.2 cm proximal to the carina.  Cardiomegaly and bilateral airspace opacities are similar to prior,  favor edema.  Bilateral pleural effusions with associated airspace opacities are  similar to prior, favor atelectasis.  Assessment/Plan   Principal Problem:  *Multiple fractures of both lower extremities Active Problems:  DM  OBSTRUCTIVE SLEEP APNEA  GERD  PAF (paroxysmal atrial fibrillation), was in SR 06/2012   HTN (hypertension)  COPD (chronic obstructive pulmonary disease)  Hyponatremia  CKD (chronic kidney disease)  Pleural effusion  Hypercapnemia  Acute respiratory failure  Chronic diastolic heart failure  Hypokalemia  Alkalosis, metabolic  Pain  UTI  (lower urinary tract infection)  Plan:   POD #1.  Open reduction and internal fixation of right distal femur fracture utilizing a 9-hole Biomet Polyax screws and plate.  Closed reduction and application of a spanning external fixator to the left lower extremity for treatment and management of the left proximal tibia fracture.  HR controlled and stable.  On 5mg / hr of Cardizem.  Mag WNL.  K+ still low as of this AM but replacement given.  Lopressor changed to q4hr yesterday.   LOS: 14 days    Ashley, Gina 09/10/2012 5:40 PM  I have seen and examined the patient along with Wilburt Finlay, PA.  I have reviewed the chart, notes and new data.  I agree with PAs note.  PLAN: Yesterday's CXR suggests she is still  hypervolemic. May need additional diuresis to allow successful extubation.  Thurmon Fair, MD, [facility] [phone] 09/10/2012, 6:05 PM

## 2012-09-10 NOTE — Progress Notes (Signed)
Name: Gina Ashley MRN: [id number] DOB: Jul 29, 1927    LOS: 14  Referring Provider:  Rito Ehrlich Reason for Referral:  AMS  PULMONARY / CRITICAL CARE MEDICINE  Brief patient description:  This is a 78 year old female w/ multiple co-morbids including: CRF stage IV, AF on coumadin, OSA (wears CPAP at HS). Recently transferred to SNF after ~ 2 week stay at [facility] for acute on chronic renal failure. Had unwitnessed fall on 10/24 resulting in BLE fractures. Admitted by medical service rx has included pain management, IV lasix gtt for volume overload and ortho consult. PCCM asked to see on 10/28 for AMS and concern about hypercarbia.   Subjective/Overnight: Intubated and sedated.  Bleeding at fixator site overnight.  Heparin gtt now off.    Tubes/Lines:  ETT 11/5>>> R PICC 10/25>>>  Micro:  Urine 11/1>>>E coli, Klebsiella Blood x2 11/06>>>  Antibiotics:  Ancef 11/05>>>11/06 Rocephin 11/06>>>  Consults:  Ortho  Tests/ Events since admission: ECHO 10/21: estimated ejection fraction was in the range of 55% to 60%. Wall motion was normal; there were no regional wall motion abnormalities. The study is not technically sufficient to allow evaluation of LV diastolic function. Renal US 10/27: right kidney WNL, left not well assessed.  11/5>>>OR for ORIF of BLE fractures, remained intubated  Vital Signs: Temp:  [98.4 F (36.9 C)-102.1 F (38.9 C)] 100.9 F (38.3 C) (11/07 0400) Pulse Rate:  [55-143] 88  (11/07 0800) Resp:  [14-20] 14  (11/07 0800) BP: (112-158)/(56-118) 132/72 mmHg (11/06 2057) SpO2:  [96 %-100 %] 100 % (11/07 0800) FiO2 (%):  [30 %] 30 % (11/07 0800)  Physical Examination: General:  Chronically ill appearing 78 year old female. Intubated Neuro: Sedated, but arousable HEENT: + mild JVD, MM dry,  ETT in place Cardiovascular:  Irregular rate, pulses palpable Lungs:  Resps even non labored, decreased bases.  Clear upper lobes Abdomen:  + bowel sounds, soft  nontender Ext: warm and dry, LLE external fixator, scant BLE edema   Principal Problem:  *Multiple fractures of both lower extremities Active Problems:  DM  OBSTRUCTIVE SLEEP APNEA  GERD  PAF (paroxysmal atrial fibrillation), was in SR 06/2012   HTN (hypertension)  COPD (chronic obstructive pulmonary disease)  Hyponatremia  CKD (chronic kidney disease)  Pleural effusion  Hypercapnemia  Acute respiratory failure  Chronic diastolic heart failure  Hypokalemia  Alkalosis, metabolic  Pain  UTI (lower urinary tract infection)   ASSESSMENT AND PLAN  PULMONARY  Lab 09/08/12 2334 09/08/12 0939 09/08/12 0345  PHART 7.438 7.391 7.408  PCO2ART 50.3* 72.6* 72.5*  PO2ART 390.0* 99.0 99.3  HCO3 33.4* 43.1* 45.1*  O2SAT 100.0 99.3 99.7   Ventilator Settings: Vent Mode:  [-] PRVC FiO2 (%):  [30 %] 30 % Set Rate:  [14 bmp] 14 bmp Vt Set:  [420 mL] 420 mL PEEP:  [5 cmH20] 5 cmH20 Plateau Pressure:  [16 cmH20-18 cmH20] 16 cmH20 CXR: no new CXR 11/7  A:   Acute Hypercarbic Respiratory Failure: in setting of narcotic effect superimposed on underlying OSA and CRF:  needs long term BIPAP,  Bilateral atelectasis Pulmonary edema/volume excess +/- element of effusion  Remains on vent post op. No wean 11/6 r/t pain control and RVR.  P:   SBT this am when more awake now that HR controlled  Limit sedation  Flovent + Xopenex Restart HS BiPAP once extubated Respiratory hygiene CXR in am   CARDIOVASCULAR  Lab 09/09/12 0035 09/09/12 0034  TROPONINI -- <0.30  LATICACIDVEN 1.1 --  PROBNP -- --   ECG:  A flutter - rate controlled  Lines: PICC 10/25  A:  AFib/Flutter with RVR - oral meds were held for over 12 hours prior to surgery.  Rate now controlled on cardizem gtt.  HTN Pulmonary edema  Probable secondary PAH  P:  Cont diltiazem drip for now - consider change to PO/per tube if remains intubated  Metoprolol Q6H Heparin gtt is OFF r/t bleeding at external fixator site  Will  restart oral medications once extubated  RENAL  Lab 09/10/12 0501 09/10/12 0500 09/09/12 1300 09/09/12 0500 09/09/12 0034 09/08/12 0345 09/07/12 0540 09/06/12 0525  NA 142 143 141 137 137 -- -- --  K 3.1* 3.1* -- -- -- -- -- --  CL 98 99 93* 90* 89* -- -- --  CO2 35* 35* 36* 37* 39* -- -- --  BUN 47* 47* 51* 56* 58* -- -- --  CREATININE 1.33* 1.36* 1.34* 1.39* 1.37* -- -- --  CALCIUM 9.2 9.2 9.8 9.9 9.7 -- -- --  MG 2.4 -- 2.4 -- 2.6* -- -- --  PHOS -- 2.7 -- 2.8 -- 3.2 3.4 3.8    Intake/Output Summary (Last 24 hours) at 09/10/12 0838 Last data filed at 09/10/12 0700  Gross per 24 hour  Intake 1121.5 ml  Output   2665 ml  Net -1543.5 ml   Foley:  10/24  A:   Acute on Chronic renal failure. - Responded well to diamox and Cr has returned to baseline level of 1.3 Hypokalemia   P:   Replete K  Avoid nephrotoxic medications Renal adjust meds Cont Diamox today  F/u chem   GASTROINTESTINAL  Lab 09/10/12 0500 09/09/12 0500 09/09/12 0034 09/08/12 0345 09/07/12 0540  AST -- -- 22 -- --  ALT -- -- 8 -- --  ALKPHOS -- -- 134* -- --  BILITOT -- -- 1.3* -- --  PROT -- -- 7.1 -- --  ALBUMIN 2.7* 3.2* 3.3* 2.8* 2.9*    A:   Obesity Malnutrition  Has been acutely on chronically ill for several weeks now.  P:   Medications switched to IV NPO for now, will start enteral feedings today if unable to extubate   HEMATOLOGIC  Lab 09/10/12 0501 09/10/12 0130 09/09/12 0500 09/09/12 0034 09/08/12 0348 09/07/12 0540  HGB 8.1* 8.7* 10.4* 10.8* 8.0* --  HCT 25.3* 27.8* 33.0* 34.2* 27.0* --  PLT 255 255 288 270 242 --  INR -- -- -- -- -- 1.18  APTT -- -- -- -- -- --   A:   Anemia of Chronic disease.  Coumadin induced coagulopathy -  Blood loss anemia -- bleeding at site of external fixator LLE on 11/7.  Heparin gtt off.  Bleeding appears resolved.  P:  Trend CBC, and coags F/u cbc this afternoon with recent bleeding  Cont hold heparin gtt  Trigger to transfuse < 7 PPI  .    INFECTIOUS  Lab 09/10/12 0501 09/10/12 0130 09/09/12 0500 09/09/12 0034 09/08/12 0348  WBC 11.4* 13.3* 11.3* 11.8* 7.5  PROCALCITON -- -- -- -- --   A: UTI - sensitive to Rocephin  Post operative infection risk - receiving Ancef postoperativley P:   Blood cultures pending  ABX as above after cultures Trend WBC   ENDOCRINE  Lab 09/10/12 0325 09/10/12 0010 09/09/12 1931 09/09/12 1606 09/09/12 1123  GLUCAP 147* 161*096* 149* 150* 131*   A:   DM w/ hyperglycemia P:   Cont CBG monitoring and SSI  NEUROLOGIC  A:  Acute Encephalopathy (recurrent), initially this was due to hypercarbia. Now due to narcotics. Family states has had this problem w. Hydrocodone in past. Interestingly did ok w/ dilaudid.  Postoperative pain Chronic Pain  P:   Supportive care Versed PRN Minimal narcotics, but needs adequate pain control.  Will use LOW dose dilaudid  Summary  Remains intubated r/t inadequate pain control, Afib with RVR 11/6.  Bleeding from LLE fixator overnight with Hgb trending down.  Cont hold heparin gtt and follow cbc closely.  Cont abx for UTI and surgical risk infection. Limit sedation this am and attempt SBT.  Will need TF today if unable to extubate.    BEST PRACTICE / DISPOSITION Level of Care:  ICU Primary Service:  triad Consultants:  PCCM Code Status:  Full-->DNR Diet: NPO   DVT Px:  Heparin gtt GI Px:  PPI Skin Integrity:  intact Social / Family:  Updated.   CC time 30 minutes  Levy Pupa, MD, PhD 09/10/2012, 11:34 AM [facility] [phone] or if no answer [phone]

## 2012-09-10 NOTE — Progress Notes (Addendum)
eLink Physician-Brief Progress Note Patient Name: Gina Ashley DOB: [birth year] MRN: [id number]  Date of Service  09/10/2012   HPI/Events of Note   RN called saying patient bleeding from ortho rod sites in LLE - 3 gauze pads changed. Hemodynamically stable  eICU Interventions  Dc heparin gtt (done by RN 10 min earlier) RN contacting ortho IF bleeding is profuse will consider protamine  Addendum  2:20 AM   Lab 09/10/12 0130 09/09/12 0500 09/09/12 0034 09/08/12 0348 09/07/12 0540  HGB 8.7* 10.4* 10.8* 8.0* 7.9*   noactive bleeding per RN  Plan Continue to hold heparin   Intervention Category Intermediate Interventions: Bleeding - evaluation and treatment with blood products  Alexiss Iturralde 09/10/2012, 1:04 AM

## 2012-09-10 NOTE — Progress Notes (Signed)
11/7:  At 0040 Family noticed blood pooling through the blanket around pt's L leg around the external fixator site and called RN to assess.  In shift report at 1900 it was shared that pt had been oozing blood from the external fixator pin sites and that Dr. Charlann Boxer was aware.    At 2000, on shift assessment, dsg around external fixator site was saturated with blood.  Dressing took down almost to the pins but a few 4x4s kept just incase clot had formed.  Redressed with a whole roll of [place]ling and two abd pads on each side of the pin sites.  Now dsg is again saturated with blood, in addition approx. 8 inch staining underneath dressing.  First page placed to PA for [facility].  Dsg again took down same as before, then reinforced as before.  Heparin gtt placed on hold.  At 0100, no reply from page to PA, notified Dr. Marchelle Gearing at [facility].  He agreed with stopping Heparin and getting CBC, orders given and done. Also agreed with letting [facility] know.  2nd page placed.  At 0140, PA from [facility] called back.  He was advised of the situation.  Agreed with above and stated to just reinforce dsg like I had done.

## 2012-09-10 NOTE — Progress Notes (Signed)
Patient ID: Gina Ashley, female   DOB: [birth year], 78 y.o.   MRN: [id number] Subjective: 2 Days Post-Op Procedure(s) (LRB): OPEN REDUCTION INTERNAL FIXATION (ORIF) PERIPROSTHETIC FRACTURE (Right) EXTERNAL FIXATION LEG (Left)   Patient remains intubated but stable Some concerns over Hgb and use of heparin gtt but resolving with holding heparin  Daughter at bedside  Objective:   VITALS:   Filed Vitals:   09/10/12 0800  BP:   Pulse: 88  Temp:   Resp: 14    Incision: scant drainage on left side now that heparin stopped whereas it had required multiple dressing chages Right thigh wound dry  LABS  Basename 09/10/12 0501 09/10/12 0130 09/09/12 0500  HGB 8.1* 8.7* 10.4*  HCT 25.3* 27.8* 33.0*  WBC 11.4* 13.3* 11.3*  PLT 255 255 288     Basename 09/10/12 0501 09/10/12 0500 09/09/12 1300  NA 142 143 141  K 3.1* 3.1* 2.9*  BUN 47* 47* 51*  CREATININE 1.33* 1.36* 1.34*  GLUCOSE 150* 149* 132*    No results found for this basename: LABPT:2,INR:2 in the last 72 hours   Assessment/Plan: 2 Days Post-Op Procedure(s) (LRB): OPEN REDUCTION INTERNAL FIXATION (ORIF) PERIPROSTHETIC FRACTURE (Right) EXTERNAL FIXATION LEG (Left)   {Plan: Orthopaedically, NWB bilateral LE Will follow while in hospital  Trying to wean off vent

## 2012-09-11 ENCOUNTER — Inpatient Hospital Stay (HOSPITAL_COMMUNITY): Payer: Medicare Other

## 2012-09-11 LAB — TYPE AND SCREEN
ABO/RH(D): A POS
Antibody Screen: POSITIVE
Unit division: 0
Unit division: 0
Unit division: 0

## 2012-09-11 LAB — RENAL FUNCTION PANEL
Albumin: 2.7 g/dL — ABNORMAL LOW (ref 3.5–5.2)
BUN: 51 mg/dL — ABNORMAL HIGH (ref 6–23)
Calcium: 9.2 mg/dL (ref 8.4–10.5)
Creatinine, Ser: 1.29 mg/dL — ABNORMAL HIGH (ref 0.50–1.10)
Phosphorus: 2.9 mg/dL (ref 2.3–4.6)
Potassium: 2.8 mEq/L — ABNORMAL LOW (ref 3.5–5.1)

## 2012-09-11 LAB — GLUCOSE, CAPILLARY
Glucose-Capillary: 147 mg/dL — ABNORMAL HIGH (ref 70–99)
Glucose-Capillary: 180 mg/dL — ABNORMAL HIGH (ref 70–99)
Glucose-Capillary: 188 mg/dL — ABNORMAL HIGH (ref 70–99)

## 2012-09-11 LAB — CBC
MCH: 27.1 pg (ref 26.0–34.0)
MCHC: 31 g/dL (ref 30.0–36.0)
MCV: 87.3 fL (ref 78.0–100.0)
Platelets: 254 10*3/uL (ref 150–400)
RDW: 17.8 % — ABNORMAL HIGH (ref 11.5–15.5)

## 2012-09-11 LAB — MAGNESIUM: Magnesium: 2.2 mg/dL (ref 1.5–2.5)

## 2012-09-11 MED ORDER — PROMETHAZINE HCL 25 MG/ML IJ SOLN: 12.5000 mg | Freq: Four times a day (QID) | INTRAMUSCULAR | Status: DC | PRN

## 2012-09-11 MED ORDER — HYDROMORPHONE HCL 2 MG PO TABS
1.0000 mg | ORAL_TABLET | ORAL | Status: DC | PRN
  Administered 2012-09-11: 2 mg via ORAL
  Administered 2012-09-11 (×3): 1 mg via ORAL
  Administered 2012-09-12: 10:00:00 via ORAL
  Administered 2012-09-12 (×4): 1 mg via ORAL
  Filled 2012-09-11 (×9): qty 1

## 2012-09-11 MED ORDER — HYDROMORPHONE HCL 2 MG PO TABS: 1.0000 mg | ORAL_TABLET | ORAL | Status: DC | PRN

## 2012-09-11 MED ORDER — HEPARIN SODIUM (PORCINE) 5000 UNIT/ML IJ SOLN
5000.0000 [IU] | Freq: Three times a day (TID) | INTRAMUSCULAR | Status: DC
  Administered 2012-09-11 – 2012-09-15 (×12): 5000 [IU] via SUBCUTANEOUS
  Filled 2012-09-11 (×16): qty 1

## 2012-09-11 MED ORDER — POTASSIUM CHLORIDE 20 MEQ/15ML (10%) PO LIQD
20.0000 meq | Freq: Once | ORAL | Status: AC
  Administered 2012-09-11: 20 meq
  Filled 2012-09-11: qty 15

## 2012-09-11 MED ORDER — POTASSIUM CHLORIDE 20 MEQ/15ML (10%) PO LIQD
40.0000 meq | ORAL | Status: AC
  Administered 2012-09-11 (×2): 40 meq
  Filled 2012-09-11 (×2): qty 30

## 2012-09-11 MED ORDER — DILTIAZEM HCL 60 MG PO TABS
60.0000 mg | ORAL_TABLET | Freq: Four times a day (QID) | ORAL | Status: DC
  Administered 2012-09-11 – 2012-09-15 (×19): 60 mg via ORAL
  Filled 2012-09-11 (×21): qty 1

## 2012-09-11 NOTE — Progress Notes (Signed)
Patient ID: Gina Ashley, female   DOB: 08-Jun-1927, 78 y.o.   MRN: [id number] Subjective: 3 Days Post-Op Procedure(s) (LRB): OPEN REDUCTION INTERNAL FIXATION (ORIF) PERIPROSTHETIC FRACTURE (Right) EXTERNAL FIXATION LEG (Left)    She remains intubated with plans to wean to extubation today, no reports of any adverse events  Objective:   VITALS:   Filed Vitals:   09/11/12 0715  BP:   Pulse: 91  Temp:   Resp: 15    Incision: dressing C/D/I, bilateral wounds  LABS  Basename 09/11/12 0157 09/10/12 1600 09/10/12 0501  HGB 7.7* 8.1* 8.1*  HCT 24.8* 26.1* 25.3*  WBC 9.2 11.4* 11.4*  PLT 254 277 255     Basename 09/11/12 0157 09/10/12 0501 09/10/12 0500  NA 143 142 143  K 2.8* 3.1* 3.1*  BUN 51* 47* 47*  CREATININE 1.29* 1.33* 1.36*  GLUCOSE 184* 150* 149*    No results found for this basename: LABPT:2,INR:2 in the last 72 hours   Assessment/Plan: 3 Days Post-Op Procedure(s) (LRB): OPEN REDUCTION INTERNAL FIXATION (ORIF) PERIPROSTHETIC FRACTURE (Right) EXTERNAL FIXATION LEG (Left)   {Plan: per CCM with regards to medical issues NWB bilateral LE PT/OT can work on general strength and conditioning, providing family with instructions I am out of town this weekend and will follow up with her Monday Call office, [phone], with any questions

## 2012-09-11 NOTE — Progress Notes (Signed)
Pt extubated at 0935 to 2Lnc - sats 100%, hr 100, bp 157/76, rr 16, diminished BBS - RT will continue to monitor.

## 2012-09-11 NOTE — Progress Notes (Signed)
Subjective: Pt extubated  This am. Hypokalemic-replaced A fib rate controlled anemic  Objective: Vital signs in last 24 hours: Temp:  [98.1 F (36.7 C)-99.1 F (37.3 C)] 98.1 F (36.7 C) (11/08 0800) Pulse Rate:  [32-115] 85  (11/08 1200) Resp:  [14-21] 15  (11/08 1200) BP: (143-151)/(62-80) 143/62 mmHg (11/08 0800) SpO2:  [90 %-100 %] 100 % (11/08 1200) FiO2 (%):  [30 %] 30 % (11/08 0812) Weight:  [98.5 kg (217 lb 2.5 oz)] 98.5 kg (217 lb 2.5 oz) (11/08 0500) Weight change:  Last BM Date: 09/10/12 Intake/Output from previous day: -1250 11/07 0701 - 11/08 0700 In: 715 [I.V.:655; IV Piggyback:60] Out: 1970 [Urine:1970] Intake/Output this shift: Total I/O In: 280 [I.V.:120; NG/GT:150; IV Piggyback:10] Out: 900 [Urine:900]  PE: General:pleasant affect, alert Heart:S1S2 irreg irreg, controlled A flutter Lungs:decreased breath sounds in the bases but otherwise clear Abd:+ BS soft non tender Ext:+ edema, external fixation on Lt leg, + pain Neuro:alert and oriented X 3, follows commands   Lab Results:  Basename 09/11/12 0157 09/10/12 1600  WBC 9.2 11.4*  HGB 7.7* 8.1*  HCT 24.8* 26.1*  PLT 254 277   BMET  Basename 09/11/12 0157 09/10/12 0501  NA 143 142  K 2.8* 3.1*  CL 101 98  CO2 33* 35*  GLUCOSE 184* 150*  BUN 51* 47*  CREATININE 1.29* 1.33*  CALCIUM 9.2 9.2    Basename 09/09/12 0034  TROPONINI <0.30    Lab Results  Component Value Date   CHOL 96 08/24/2012   HDL 43 08/24/2012   LDLCALC 38 08/24/2012   TRIG 74 08/24/2012   CHOLHDL 2.2 08/24/2012   Lab Results  Component Value Date   HGBA1C 7.0* 09/04/2012     Lab Results  Component Value Date   TSH 1.492 08/27/2012    Hepatic Function Panel  Basename 09/11/12 0157 09/09/12 0034  PROT -- 7.1  ALBUMIN 2.7* --  AST -- 22  ALT -- 8  ALKPHOS -- 134*  BILITOT -- 1.3*  BILIDIR -- 0.5*  IBILI -- 0.8    Studies/Results: Dg Chest Port 1 View  09/11/2012  *RADIOLOGY REPORT*  Clinical Data:  Respiratory failure  PORTABLE CHEST - 1 VIEW  Comparison: 09/09/2012  Findings: Cardiomegaly with central vascular congestion.  Interval extubation.  NG tube descends below the level of the image with the side port projecting just below the GE junction.  Bilateral infrahilar and bibasilar opacities and layering pleural effusions. No pneumothorax.  No acute osseous finding.  Multilevel degenerative change.  IMPRESSION: Interval extubation.  Edema pattern, bibasilar opacities and pleural effusions, similar to prior.   Original Report Authenticated By: Jearld Lesch, M.D.     Medications: I have reviewed the patient's current medications.    Marland Kitchen antiseptic oral rinse  15 mL Mouth Rinse QID  . cefTRIAXone (ROCEPHIN)  IV  1 g Intravenous Q24H  . chlorhexidine  15 mL Mouth Rinse BID  . darbepoetin (ARANESP) injection - NON-DIALYSIS  100 mcg Subcutaneous Q Fri-1800  . diltiazem  60 mg Oral QID  . feeding supplement  30 mL Oral TID WC  . fluticasone  1 puff Inhalation BID  . heparin subcutaneous  5,000 Units Subcutaneous Q8H  . insulin aspart  2-6 Units Subcutaneous Q4H  . levalbuterol  0.63 mg Nebulization BID  . lidocaine  2 patch Transdermal Q24H  . metoprolol  2.5 mg Intravenous Q6H  . pantoprazole (PROTONIX) IV  40 mg Intravenous Daily  . [COMPLETED] potassium chloride  20  mEq Per Tube Once  . [COMPLETED] potassium chloride  40 mEq Per Tube Q1 Hr x 2  . sodium chloride  10-40 mL Intracatheter Q12H   Assessment/Plan: Principal Problem:  *Multiple fractures of both lower extremities Active Problems:  DM  OBSTRUCTIVE SLEEP APNEA  GERD  PAF (paroxysmal atrial fibrillation), was in SR 06/2012   HTN (hypertension)  COPD (chronic obstructive pulmonary disease)  Hyponatremia  CKD (chronic kidney disease)  Pleural effusion  Hypercapnemia  Acute respiratory failure  Chronic diastolic heart failure  Hypokalemia  Alkalosis, metabolic  Pain  UTI (lower urinary tract infection)  PLAN: 3  Days Post-Op Procedure(s) (LRB):  OPEN REDUCTION INTERNAL FIXATION (ORIF) PERIPROSTHETIC FRACTURE (Right)  EXTERNAL FIXATION LEG (Left)   Extubated, on 2 L [place]  IV Dilt stopped now on po tablet, 60 mg QID, continues on lopressor IV every 6 hours ? Change to po?  LOS: 15 days   INGOLD,LAURA R 09/11/2012, 12:50 PM  I seen and evaluated the patient this PM after Nada Boozer, NP. I agree with their findings, examination as well as impression recommendations.  She was extubated today & is now wide awake.  Hurts a bit.  HR remains well controlled on PO meds.  BP stable. She is anemic -- drop again today, may need to think about transfusion. Still seems volume overloaded -- would continue with diuresis; renal Fxn is stable.  Anticipate A-line out & to Tele tomorrow.  Will be available over the weekend if her condition deteriorates.  Will see again Monday.  Marykay Lex, M.D., M.S. [facility] [address] [place], [place]  [address]  [phone] Pager # [phone] 09/11/2012 7:12 PM

## 2012-09-11 NOTE — Progress Notes (Addendum)
Name: Gina Ashley MRN: [id number] DOB: Feb 18, 1927    LOS: 15  Referring MD:  Rito Ehrlich Reason for Referral:  AMS  PULMONARY / CRITICAL CARE MEDICINE  Brief patient description:  This is a 78 year old female w/ multiple co-morbids including: CRF stage IV, AF on coumadin, OSA (wears CPAP at HS). Recently transferred to SNF after ~ 2 week stay at [facility] for acute on chronic renal failure. Had unwitnessed fall on 10/24 resulting in BLE fractures. Admitted by medical service rx has included pain management, IV lasix gtt for volume overload and ortho consult. PCCM asked to see on 10/28 for AMS and concern about hypercarbia.   Tubes/Lines:  ETT 11/5>>>11/8 R PICC 10/25>>>  Micro:  Urine 11/1>>>E coli, Klebsiella Blood x2 11/06>>>  Antibiotics:  Ancef 11/05>>>11/06 Rocephin 11/06>>>  Consults:  Ortho  Tests/ Events since admission: ECHO 10/21: estimated ejection fraction was in the range of 55% to 60%. Wall motion was normal; there were no regional wall motion abnormalities. The study is not technically sufficient to allow evaluation of LV diastolic function. Renal US 10/27: right kidney WNL, left not well assessed.  11/5>>>OR for ORIF of BLE fractures, remained intubated  Subjective Ready for extubation.   Vital Signs: Temp:  [98.2 F (36.8 C)-99.1 F (37.3 C)] 98.2 F (36.8 C) (11/08 0400) Pulse Rate:  [32-115] 87  (11/08 0900) Resp:  [14-22] 18  (11/08 0900) BP: (143-151)/(62-80) 143/62 mmHg (11/08 0800) SpO2:  [90 %-100 %] 100 % (11/08 0900) FiO2 (%):  [30 %] 30 % (11/08 0812) Weight:  [98.5 kg (217 lb 2.5 oz)] 98.5 kg (217 lb 2.5 oz) (11/08 0500)  Physical Examination: General:  Chronically ill appearing 78 year old female. Intubated, looks ready for extubation  Neuro: Sedated, but arousable HEENT: + mild JVD, MM dry,  ETT in place Cardiovascular:  Irregular rate, pulses palpable Lungs:  Resps even non labored, decreased bases.  Clear upper lobes, good vt on  PSV Abdomen:  + bowel sounds, soft nontender Ext: warm and dry, LLE external fixator, scant BLE edema   Principal Problem:  *Multiple fractures of both lower extremities Active Problems:  DM  OBSTRUCTIVE SLEEP APNEA  GERD  PAF (paroxysmal atrial fibrillation), was in SR 06/2012   HTN (hypertension)  COPD (chronic obstructive pulmonary disease)  Hyponatremia  CKD (chronic kidney disease)  Pleural effusion  Hypercapnemia  Acute respiratory failure  Chronic diastolic heart failure  Hypokalemia  Alkalosis, metabolic  Pain  UTI (lower urinary tract infection)   ASSESSMENT AND PLAN  PULMONARY  Lab 09/08/12 2334 09/08/12 0939 09/08/12 0345  PHART 7.438 7.391 7.408  PCO2ART 50.3* 72.6* 72.5*  PO2ART 390.0* 99.0 99.3  HCO3 33.4* 43.1* 45.1*  O2SAT 100.0 99.3 99.7   Ventilator Settings: Vent Mode:  [-] CPAP FiO2 (%):  [30 %] 30 % Set Rate:  [14 bmp] 14 bmp Vt Set:  [420 mL] 420 mL PEEP:  [5 cmH20] 5 cmH20 Pressure Support:  [5 cmH20-8 cmH20] 5 cmH20 Plateau Pressure:  [14 cmH20-18 cmH20] 16 cmH20 CXR: no new CXR 11/7  A:   Acute Hypercarbic Respiratory Failure: in setting of narcotic effect superimposed on underlying OSA and CRF:  needs long term BIPAP,  Bilateral atelectasis Pulmonary edema/volume excess +/- element of effusion  Remains on vent post op. Now awake and appropriate, passed SBT P:   Extubate BIPAP at HS and PRN Flovent + Xopenex Respiratory hygiene  CARDIOVASCULAR  Lab 09/09/12 0035 09/09/12 0034  TROPONINI -- <0.30  LATICACIDVEN 1.1 --  PROBNP -- --   ECG:  A flutter - rate controlled  Lines: PICC 10/25  A:  AFib/Flutter with RVR - oral meds were held for over 12 hours prior to surgery.  Rate now controlled on cardizem gtt.  HTN Pulmonary edema  Probable secondary PAH  P:  Metoprolol Q6H Heparin gtt is OFF r/t bleeding at external fixator site  Will restart oral medications once extubated  RENAL  Lab 09/11/12 0157 09/10/12 0501  09/10/12 0500 09/09/12 1300 09/09/12 0500 09/09/12 0034 09/08/12 0345 09/07/12 0540  NA 143 142 143 141 137 -- -- --  K 2.8* 3.1* -- -- -- -- -- --  CL 101 98 99 93* 90* -- -- --  CO2 33* 35* 35* 36* 37* -- -- --  BUN 51* 47* 47* 51* 56* -- -- --  CREATININE 1.29* 1.33* 1.36* 1.34* 1.39* -- -- --  CALCIUM 9.2 9.2 9.2 9.8 9.9 -- -- --  MG 2.2 2.4 -- 2.4 -- 2.6* -- --  PHOS 2.9 -- 2.7 -- 2.8 -- 3.2 3.4    Intake/Output Summary (Last 24 hours) at 09/11/12 0915 Last data filed at 09/11/12 0900  Gross per 24 hour  Intake    855 ml  Output   2255 ml  Net  -1400 ml   Foley:  10/24  A:   Acute on Chronic renal failure. - Responded well to diamox and Cr has returned to baseline level of 1.3 Hypokalemia   P:   Replete K  Avoid nephrotoxic medications Renal adjust meds Cont Diamox today  F/u chem   GASTROINTESTINAL  Lab 09/11/12 0157 09/10/12 0500 09/09/12 0500 09/09/12 0034 09/08/12 0345  AST -- -- -- 22 --  ALT -- -- -- 8 --  ALKPHOS -- -- -- 134* --  BILITOT -- -- -- 1.3* --  PROT -- -- -- 7.1 --  ALBUMIN 2.7* 2.7* 3.2* 3.3* 2.8*    A:   Obesity Malnutrition  Has been acutely on chronically ill for several weeks now.  P:   Medications switched to IV NPO for now, will start enteral feedings today if unable to extubate   HEMATOLOGIC  Lab 09/11/12 0157 09/10/12 1600 09/10/12 0501 09/10/12 0130 09/09/12 0500 09/07/12 0540  HGB 7.7* 8.1* 8.1* 8.7* 10.4* --  HCT 24.8* 26.1* 25.3* 27.8* 33.0* --  PLT 254 277 255 255 288 --  INR -- -- -- -- -- 1.18  APTT -- -- -- -- -- --   A:   Anemia of Chronic disease.  Coumadin induced coagulopathy -  Blood loss anemia -- bleeding at site of external fixator LLE on 11/7.  Heparin gtt off.  Bleeding appears resolved.  P:  Trend CBC, and coags F/u cbc this afternoon with recent bleeding  Cont hold heparin gtt, will start coumadin back in 24 hrs if hgb remains stable.  Trigger to transfuse < 7 PPI   INFECTIOUS  Lab 09/11/12  0157 09/10/12 1600 09/10/12 0501 09/10/12 0130 09/09/12 0500  WBC 9.2 11.4* 11.4* 13.3* 11.3*  PROCALCITON -- -- -- -- --   A: UTI - sensitive to Rocephin P:   See dashboard, will complete 5 d rx   ENDOCRINE  Lab 09/11/12 0744 09/11/12 0422 09/10/12 2347 09/10/12 2031 09/10/12 1552  GLUCAP 180*180* 147* 155* 184*184* 153*   A:   DM w/ hyperglycemia P:   Cont CBG monitoring and SSI   NEUROLOGIC  A:  Acute Encephalopathy (recurrent), initially this was due to hypercarbia. Now due  to narcotics. Family states has had this problem w. Hydrocodone in past. Interestingly did ok w/ dilaudid. Now awake and appropriate.  Postoperative pain Chronic Pain  P:   Supportive care Versed PRN Minimal narcotics, but needs adequate pain control.  Will use LOW dose dilaudid  Summary  Ready for extubation today. Bleeding from LLE fixator overnight with Hgb trending down on 11/7, now stable. Will start prophylaxis dose [place] heparin and then coumadin to resume 11/9 if hgb remains stable. Will compete 5d total rocephin, resume po meds. Will need BIPAP at HS and PRN. Will need to be careful w/ narcotics.   BEST PRACTICE / DISPOSITION Level of Care:  ICU Primary Service:  PCCM Consultants:  PCCM Code Status:  Full-->DNR Diet: NPO  >> bedside swallow eval DVT Px:  Heparin gtt (held 11/7) >> will start heparin sq 11/8 until able to restart full anticoag GI Px:  PPI Skin Integrity:  intact Social / Family:  Updated.   CC time 30 minutes  Levy Pupa, MD, PhD 09/11/2012, 10:19 AM [facility] [phone] or if no answer [phone]

## 2012-09-12 DIAGNOSIS — I2789 Other specified pulmonary heart diseases: Secondary | ICD-10-CM

## 2012-09-12 LAB — ALBUMIN: Albumin: 2.9 g/dL — ABNORMAL LOW (ref 3.5–5.2)

## 2012-09-12 LAB — CBC
MCH: 27.2 pg (ref 26.0–34.0)
MCHC: 31 g/dL (ref 30.0–36.0)
MCV: 87.8 fL (ref 78.0–100.0)
Platelets: 246 10*3/uL (ref 150–400)
RBC: 2.87 MIL/uL — ABNORMAL LOW (ref 3.87–5.11)
RDW: 17.7 % — ABNORMAL HIGH (ref 11.5–15.5)

## 2012-09-12 LAB — COMPREHENSIVE METABOLIC PANEL
ALT: <5 U/L (ref 0–35)
AST: 17 U/L (ref 0–37)
Calcium: 9.1 mg/dL (ref 8.4–10.5)
Creatinine, Ser: 1.06 mg/dL (ref 0.50–1.10)
GFR calc non Af Amer: 47 mL/min — ABNORMAL LOW (ref >90)
Sodium: 139 mEq/L (ref 135–145)
Total Protein: 6.6 g/dL (ref 6.0–8.3)

## 2012-09-12 LAB — GLUCOSE, CAPILLARY
Glucose-Capillary: 126 mg/dL — ABNORMAL HIGH (ref 70–99)
Glucose-Capillary: 152 mg/dL — ABNORMAL HIGH (ref 70–99)

## 2012-09-12 MED ORDER — PANTOPRAZOLE SODIUM 40 MG PO TBEC
40.0000 mg | DELAYED_RELEASE_TABLET | Freq: Every day | ORAL | Status: DC
  Administered 2012-09-13 – 2012-09-15 (×3): 40 mg via ORAL
  Filled 2012-09-12 (×3): qty 1

## 2012-09-12 MED ORDER — METOPROLOL TARTRATE 50 MG PO TABS
50.0000 mg | ORAL_TABLET | Freq: Two times a day (BID) | ORAL | Status: DC
  Administered 2012-09-12 – 2012-09-15 (×7): 50 mg via ORAL
  Filled 2012-09-12 (×8): qty 1

## 2012-09-12 MED ORDER — HYDROCODONE-ACETAMINOPHEN 5-325 MG PO TABS
1.0000 | ORAL_TABLET | ORAL | Status: DC | PRN
  Administered 2012-09-12 (×2): 2 via ORAL
  Administered 2012-09-12: 1 via ORAL
  Administered 2012-09-13 – 2012-09-15 (×14): 2 via ORAL
  Filled 2012-09-12 (×10): qty 2
  Filled 2012-09-12: qty 1
  Filled 2012-09-12 (×6): qty 2

## 2012-09-12 MED ORDER — INSULIN ASPART 100 UNIT/ML ~~LOC~~ SOLN
0.0000 [IU] | Freq: Three times a day (TID) | SUBCUTANEOUS | Status: DC
  Administered 2012-09-12 – 2012-09-13 (×2): 3 [IU] via SUBCUTANEOUS
  Administered 2012-09-13: 5 [IU] via SUBCUTANEOUS
  Administered 2012-09-13: 2 [IU] via SUBCUTANEOUS
  Administered 2012-09-14 – 2012-09-15 (×4): 3 [IU] via SUBCUTANEOUS
  Administered 2012-09-15: 2 [IU] via SUBCUTANEOUS

## 2012-09-12 MED ORDER — WARFARIN SODIUM 5 MG PO TABS
5.0000 mg | ORAL_TABLET | Freq: Once | ORAL | Status: AC
  Administered 2012-09-12: 5 mg via ORAL
  Filled 2012-09-12 (×2): qty 1

## 2012-09-12 MED ORDER — FENTANYL CITRATE 0.05 MG/ML IJ SOLN
50.0000 ug | INTRAMUSCULAR | Status: DC | PRN
  Administered 2012-09-12 – 2012-09-14 (×9): 50 ug via INTRAVENOUS
  Filled 2012-09-12 (×9): qty 2

## 2012-09-12 MED ORDER — PANTOPRAZOLE SODIUM 40 MG PO TBEC: 40.0000 mg | DELAYED_RELEASE_TABLET | Freq: Every day | ORAL | Status: DC

## 2012-09-12 MED ORDER — WARFARIN - PHARMACIST DOSING INPATIENT: Freq: Every day | Status: DC

## 2012-09-12 MED ORDER — POTASSIUM CHLORIDE 20 MEQ/15ML (10%) PO LIQD
40.0000 meq | ORAL | Status: AC
  Administered 2012-09-12 (×2): 40 meq
  Filled 2012-09-12 (×2): qty 30

## 2012-09-12 NOTE — Progress Notes (Signed)
ANTICOAGULATION CONSULT NOTE - Initial Consult  Pharmacy Consult for warfarin Indication: atrial fibrillation  Allergies  Allergen Reactions  . Morphine Sulfate Other (See Comments)    REACTION: change in personality  . Remeron (Mirtazapine) Other (See Comments)    Altered mental status, lethargy  . Tuna (Fish Allergy)   . Penicillins Rash    Tolerates Ancef.    Patient Measurements: Height: 5\' 3"  (160 cm) Weight: 188 lb 7.9 oz (85.5 kg) IBW/kg (Calculated) : 52.4   Vital Signs: Temp: 98 F (36.7 C) (11/09 1200) Temp src: Oral (11/09 1200) BP: 138/70 mmHg (11/09 1200) Pulse Rate: 88  (11/09 1200)  Labs:  Basename 09/12/12 0456 09/11/12 0157 09/10/12 1600 09/10/12 0501 09/09/12 1444  HGB 7.8* 7.7* -- -- --  HCT 25.2* 24.8* 26.1* -- --  PLT 246 254 277 -- --  APTT -- -- -- -- --  LABPROT -- -- -- -- --  INR -- -- -- -- --  HEPARINUNFRC -- -- -- <0.10* 0.32  CREATININE 1.06 1.29* -- 1.33* --  CKTOTAL -- -- -- -- --  CKMB -- -- -- -- --  TROPONINI -- -- -- -- --    Estimated Creatinine Clearance: 41.6 ml/min (by C-G formula based on Cr of 1.06).   Medical History: Past Medical History  Diagnosis Date  . Diabetes mellitus     x 15 yrs  . GERD (gastroesophageal reflux disease)   . Back pain, chronic   . Pain in shoulder   . Pain in ankle   . Hiatal hernia   . HTN (hypertension)     all her life  . CKD (chronic kidney disease), stage III     GFR: 38  . Atrial fibrillation     since 1996  . Sleep apnea   . Fall 3 weeks ago    was evaluated at [facility]  . OSA on CPAP   . Depression   . PONV (postoperative nausea and vomiting)     difficult to wake up  . Chronic diastolic heart failure 08/31/2012    Medications:  Scheduled:    . antiseptic oral rinse  15 mL Mouth Rinse QID  . cefTRIAXone (ROCEPHIN)  IV  1 g Intravenous Q24H  . chlorhexidine  15 mL Mouth Rinse BID  . darbepoetin (ARANESP) injection - NON-DIALYSIS  100 mcg Subcutaneous Q Fri-1800    . diltiazem  60 mg Oral QID  . feeding supplement  30 mL Oral TID WC  . fluticasone  1 puff Inhalation BID  . heparin subcutaneous  5,000 Units Subcutaneous Q8H  . insulin aspart  2-6 Units Subcutaneous Q4H  . levalbuterol  0.63 mg Nebulization BID  . lidocaine  2 patch Transdermal Q24H  . metoprolol  2.5 mg Intravenous Q6H  . pantoprazole (PROTONIX) IV  40 mg Intravenous Daily  . [COMPLETED] potassium chloride  40 mEq Per Tube Q1 Hr x 2  . sodium chloride  10-40 mL Intracatheter Q12H   Infusions:    . sodium chloride 20 mL/hr at 09/10/12 1351    Assessment:  78 yo with multiple medical conditions s/p ORIF periprosthetic right fracture and external fixation left leg on 11/6  Patient was on warfarin PTA for hx afib  IV heparin was being used post op initially but oozing from surgical site resulted and was changed SQ heparin at ppx dosing due to further bleeding risk  Per notes, bleeding appears resolved and to continue SQ heparin and restart warfarin per CCM orders  Note that patient was on warfarin 5.5mg  daily PTA  CBC stable   Goal of Therapy:  INR 2-3    Plan:  1) warfarin 5mg  tonight 2) Daily INR 3) D/C Sq heparin when INR > 2   Hessie Knows, PharmD, BCPS Pager [phone] 09/12/2012 1:17 PM

## 2012-09-12 NOTE — Progress Notes (Signed)
Name: Gina Ashley MRN: [id number] DOB: [birth year]    LOS: 16  Referring MD:  Rito Ehrlich Reason for Referral:  AMS  PULMONARY / CRITICAL CARE MEDICINE  Brief patient description:  This is a 78 year old female w/ multiple co-morbids including: CRF stage IV, AF on coumadin, OSA (wears CPAP at HS). Recently transferred to SNF after ~ 2 week stay at [facility] for acute on chronic renal failure. Had unwitnessed fall on 10/24 resulting in BLE fractures. Admitted by medical service rx has included pain management, IV lasix gtt for volume overload and ortho consult. PCCM asked to see on 10/28 for AMS and concern about hypercarbia.   Tubes/Lines:  ETT 11/5>>>11/8 R PICC 10/25>>>  Micro:  Urine 11/1>>>E coli, Klebsiella Blood x2 11/06>>>NEG  Antibiotics:  Ancef 11/05>>>11/06 Rocephin 11/06 (UTI)>>>  Consults:  Ortho [facility]  Tests/ Events since admission: ECHO 10/21: estimated ejection fraction was in the range of 55% to 60%. Wall motion was normal; there were no regional wall motion abnormalities. The study is not technically sufficient to allow evaluation of LV diastolic function. Renal US 10/27: right kidney WNL, left not well assessed.  11/5>>>OR for ORIF of BLE fractures, remained intubated  Subjective Tolerated extubation 11/8  Vital Signs: Temp:  [97.6 F (36.4 C)-98.8 F (37.1 C)] 97.8 F (36.6 C) (11/09 0800) Pulse Rate:  [77-119] 104  (11/09 0800) Resp:  [7-20] 17  (11/09 0800) BP: (119-152)/(70-92) 152/85 mmHg (11/09 0800) SpO2:  [98 %-100 %] 100 % (11/09 0820) Weight:  [85.5 kg (188 lb 7.9 oz)] 85.5 kg (188 lb 7.9 oz) (11/09 0400)  Physical Examination: General:  Chronically ill appearing 78 year old female.  Neuro:awake and alert, moves all 4s HEENT: + mild JVD, MM dry,   Cardiovascular:  Irregular rate, pulses palpable Lungs:  Resps even non labored, Abdomen:  + bowel sounds, soft nontender Ext: warm and dry, LLE external fixator, scant BLE edema    Principal Problem:  *Multiple fractures of both lower extremities Active Problems:  DM  OBSTRUCTIVE SLEEP APNEA  GERD  PAF (paroxysmal atrial fibrillation), was in SR 06/2012   HTN (hypertension)  COPD (chronic obstructive pulmonary disease)  Hyponatremia  CKD (chronic kidney disease)  Pleural effusion  Hypercapnemia  Acute respiratory failure  Chronic diastolic heart failure  Hypokalemia  Alkalosis, metabolic  Pain  UTI (lower urinary tract infection)   ASSESSMENT AND PLAN  PULMONARY  Lab 09/08/12 2334 09/08/12 0939 09/08/12 0345  PHART 7.438 7.391 7.408  PCO2ART 50.3* 72.6* 72.5*  PO2ART 390.0* 99.0 99.3  HCO3 33.4* 43.1* 45.1*  O2SAT 100.0 99.3 99.7   Ventilator Settings:   CXR: no new CXR 11/9,  Edema and effusions on 11/8  A:   Acute Hypercarbic Respiratory Failure: in setting of narcotic effect superimposed on underlying OSA and CRF: Tolerated extubation well 11/8:  needs long term BIPAP qhs Bilateral atelectasis Pulmonary edema/volume excess +/- element of effusion  Improved.  BIPAP at HS and PRN Flovent + Xopenex Respiratory hygiene  CARDIOVASCULAR  Lab 09/09/12 0035 09/09/12 0034  TROPONINI -- <0.30  LATICACIDVEN 1.1 --  PROBNP -- --   ECG:  None 11/9 Lines: PICC 10/25  A:  AFib/Flutter with RVR - oral meds were held for over 12 hours prior to surgery. HTN Pulmonary edema  Probable secondary PAH  P:  Change to po metoprolol Cont po diltiazem Q6H RENAL  Lab 09/12/12 0456 09/11/12 0157 09/10/12 0501 09/10/12 0500 09/09/12 1300 09/09/12 0500 09/09/12 0034 09/08/12 0345  NA  139 143 142 143 141 -- -- --  K 2.9* 2.8* -- -- -- -- -- --  CL 100 101 98 99 93* -- -- --  CO2 32 33* 35* 35* 36* -- -- --  BUN 41* 51* 47* 47* 51* -- -- --  CREATININE 1.06 1.29* 1.33* 1.36* 1.34* -- -- --  CALCIUM 9.1 9.2 9.2 9.2 9.8 -- -- --  MG -- 2.2 2.4 -- 2.4 -- 2.6* --  PHOS 2.7 2.9 -- 2.7 -- 2.8 -- 3.2    Intake/Output Summary (Last 24 hours) at  09/12/12 1016 Last data filed at 09/12/12 0800  Gross per 24 hour  Intake   1705 ml  Output   2855 ml  Net  -1150 ml   Foley:  10/24  A:   Acute on Chronic renal failure. -Cr has returned to baseline level of 1.1 Hypokalemia >>>repletion  Metabolic alkalosis  P:   Replete K further Avoid nephrotoxic medications D/c diamox  GASTROINTESTINAL  Lab 09/12/12 0456 09/11/12 0157 09/10/12 0500 09/09/12 0500 09/09/12 0034  AST 17 -- -- -- 22  ALT <5 -- -- -- 8  ALKPHOS 101 -- -- -- 134*  BILITOT 0.8 -- -- -- 1.3*  PROT 6.6 -- -- -- 7.1  ALBUMIN 2.9*2.9* 2.7* 2.7* 3.2* 3.3*    A:   Obesity Malnutrition  Has been acutely on chronically ill for several weeks now.  P:   Advance to PO diet. Meds to PO  HEMATOLOGIC  Lab 09/12/12 0456 09/11/12 0157 09/10/12 1600 09/10/12 0501 09/10/12 0130 09/07/12 0540  HGB 7.8* 7.7* 8.1* 8.1* 8.7* --  HCT 25.2* 24.8* 26.1* 25.3* 27.8* --  PLT 246 254 277 255 255 --  INR -- -- -- -- -- 1.18  APTT -- -- -- -- -- --   A:   Anemia of Chronic disease.  Coumadin induced coagulopathy -  Blood loss anemia -- bleeding at site of external fixator LLE on 11/7.  Heparin gtt off.  Bleeding appears resolved.  P:  Trend CBC, and coags F/u cbc this afternoon with recent bleeding  Resume coumadin per pharmacy protocol Trigger to transfuse < 7 PPI   INFECTIOUS  Lab 09/12/12 0456 09/11/12 0157 09/10/12 1600 09/10/12 0501 09/10/12 0130  WBC 10.6* 9.2 11.4* 11.4* 13.3*  PROCALCITON -- -- -- -- --   A: UTI - sensitive to Rocephin P:   See dashboard, will complete 5 d rx   ENDOCRINE  Lab 09/12/12 0801 09/12/12 0441 09/12/12 0015 09/11/12 2003 09/11/12 1604  GLUCAP 135* 126* 152* 191* 179*   A:   DM w/ hyperglycemia P:   Cont CBG monitoring and SSI   NEUROLOGIC  A:  Acute Encephalopathy (recurrent), initially this was due to hypercarbia. Now due to narcotics. Family states has had this problem w. Hydrocodone in past. Interestingly did ok  w/ dilaudid. Now awake and appropriate.  Postoperative pain Chronic Pain  P:   Supportive care Minimal narcotics, but needs adequate pain control.  Will use LOW hydrocodone and IV fentanyl  Summary  Tolerated extubation well 11/8.Marland Kitchen Bleeding from LLE fixator overnight with Hgb trending down on 11/7, now stable. Will cont  prophylaxis dose [place] heparin and then coumadin to resume today  11/9  Will compete 5d total rocephin, resume po meds. Will cont BIPAP at HS and PRN. Will need to be careful w/ narcotics.   BEST PRACTICE / DISPOSITION Level of Care:  ICU Primary Service:  PCCM>>>Tfr to [facility] svc Consultants:  Cards  Code Status:  Full-->DNR Diet: NPO  >> bedside swallow eval passed>>>low salt diet DVT Px: hep sq, ok for full coumadin to resume GI Px:  PPI Skin Integrity:  intact Social / Family:  Updated.   CC time 30 minutes  Caryl Bis  [phone]  Cell  [phone]  If no response or cell goes to voicemail, call beeper [phone]  09/12/2012, 10:16 AM [facility]

## 2012-09-12 NOTE — Progress Notes (Signed)
Patient refuses CPAP at this time. She states that she is tired and does not want to deal with machine. RT encouraged her to call if she changed her mind.

## 2012-09-12 NOTE — Progress Notes (Signed)
This patient is receiving IV Protonix. Based on criteria approved by the Pharmacy and Therapeutics Committee, this medication is being converted to the equivalent oral dose form. These criteria include:   . The patient is eating (either orally or per tube) and/or has been taking other orally administered medications for at least 24 hours.  . This patient has no evidence of active gastrointestinal bleeding or impaired GI absorption (gastrectomy, short bowel, patient on TNA or NPO).   If you have questions about this conversion, please contact the pharmacy department.  Berkley Harvey, [place] 09/12/2012 1:19 PM

## 2012-09-12 NOTE — Progress Notes (Signed)
Pt meets criteria for COPD GOLD pt . Dr Delford Field notified and he will initiate order set.NUrse caring for pt notified,

## 2012-09-12 NOTE — Evaluation (Addendum)
Physical Therapy Evaluation Patient Details Name: Gina Ashley MRN: [id number] DOB: 11/19/1926 Today's Date: 09/12/2012 Time: 7829-5621 PT Time Calculation (min): 39 min  PT Assessment / Plan / Recommendation Clinical Impression  This is a 78 year old female w/ multiple co-morbids including: CRF stage IV, AF on coumadin, OSA (wears CPAP at HS). Recently transferred to SNF after ~ 2 week stay at [facility] for acute on chronic renal failure. Had unwitnessed fall on 10/24 resulting in BLE fractures. Admitted by medical service rx has included pain management, IV lasix t for volume overload and ortho consult. PCCM asked to see on 10/28 for AMS and concern about hypercarbia. . Right periprosthetic distal femur fracture. 2. Left periprosthetic proximal tibia fracture  s/p ORIF LLE and external fixator on  RLE; pt will benefit from PT to maximize function and prevent  complications  related to immobiility    PT Assessment  Patient needs continued PT services    Follow Up Recommendations  LTACH    Does the patient have the potential to tolerate intense rehabilitation   No, Recommend SNF  Barriers to Discharge        Equipment Recommendations  None recommended by PT    Recommendations for Other Services     Frequency Min 3X/week    Precautions / Restrictions Precautions Precautions: Fall Precaution Comments: External Fixator LLE Restrictions Weight Bearing Restrictions: Yes RLE Weight Bearing: Non weight bearing LLE Weight Bearing: Non weight bearing   Pertinent Vitals/Pain       Mobility  Bed Mobility Bed Mobility: Rolling Right;Rolling Left Rolling Right: 1: +2 Total assist Rolling Right: Patient Percentage: 20% Rolling Left: 1: +2 Total assist Rolling Left: Patient Percentage:  (multiple lines) Details for Bed Mobility Assistance: +2 for turn and LE support; unable to roll completely to LEFT side due to ex-fix/pins; cues for use of ub/trunk Transfers Transfers: Not  assessed Ambulation/Gait Ambulation/Gait Assistance: Not tested (comment)    Shoulder Instructions     Exercises General Exercises - Lower Extremity Ankle Circles/Pumps: AAROM;Both;5 reps (dtr to encourage ankle pumps)   PT Diagnosis: Generalized weakness;Acute pain  PT Problem List: Decreased strength;Decreased range of motion;Decreased activity tolerance;Decreased balance;Decreased mobility;Pain;Decreased knowledge of use of DME PT Treatment Interventions: DME instruction;Functional mobility training;Therapeutic activities;Therapeutic exercise;Balance training;Patient/family education   PT Goals Acute Rehab PT Goals PT Goal Formulation: With patient Time For Goal Achievement: 09/26/12 Potential to Achieve Goals: Good Pt will go Supine/Side to Sit: with +2 total assist (pt= 50%) PT Goal: Supine/Side to Sit - Progress: Goal set today Pt will Sit at [facility] of Bed: with bilateral upper extremity support;with supervision;3-5 min PT Goal: Sit at Edge Of Bed - Progress: Goal set today Pt will go Sit to Supine/Side: with +2 total assist (pt = 50%) PT Goal: Sit to Supine/Side - Progress: Goal set today  Visit Information  Last PT Received On: 09/12/12 Assistance Needed: +2    Subjective Data  Subjective: don't know the name of this place  Patient Stated Goal: to go back to rehab    Prior Functioning  Home Living Available Help at Discharge: Skilled Nursing Facility Additional Comments: pt was at [facility], went to [place] from there; states she was doing some standing but not walking yet taken     Cognition  Overall Cognitive Status: Appears within functional limits for tasks assessed/performed Arousal/Alertness: Awake/alert Orientation Level: Disoriented X4;Time Behavior During Session: Anxious Cognition - Other Comments: pt able to state she was in hospital and knew the yr only with multiple  choice    Extremity/Trunk Assessment Right Upper Extremity Assessment RUE  ROM/Strength/Tone: Deficits RUE ROM/Strength/Tone Deficits: grasp poor; elbow flexion 2+/5; elbow extension 2/5; shoulder grossly 2 to 2+/5 RUE Sensation: Deficits Left Upper Extremity Assessment LUE ROM/Strength/Tone: Deficits LUE ROM/Strength/Tone Deficits: same as RUE  Right Lower Extremity Assessment RLE ROM/Strength/Tone: Deficits;Unable to fully assess;Due to pain RLE ROM/Strength/Tone Deficits: AAROM at ankle WFL; ankle df 2/5, pf 2+/5  Left Lower Extremity Assessment LLE ROM/Strength/Tone: Deficits;Unable to fully assess;Due to pain LLE ROM/Strength/Tone Deficits: same as R ankle    Balance    End of Session PT - End of Session Activity Tolerance: Patient tolerated treatment well Patient left: in chair;with call bell/phone within reach;with family/visitor present Nurse Communication: Mobility status;Need for lift equipment (MAXI SKY)  GP     [facility] 09/12/2012, 4:46 PM

## 2012-09-13 DIAGNOSIS — S82109A Unspecified fracture of upper end of unspecified tibia, initial encounter for closed fracture: Secondary | ICD-10-CM

## 2012-09-13 DIAGNOSIS — J9610 Chronic respiratory failure, unspecified whether with hypoxia or hypercapnia: Secondary | ICD-10-CM

## 2012-09-13 LAB — PROTIME-INR
INR: 1.77 — ABNORMAL HIGH (ref 0.00–1.49)
Prothrombin Time: 20 seconds — ABNORMAL HIGH (ref 11.6–15.2)

## 2012-09-13 LAB — BASIC METABOLIC PANEL
BUN: 38 mg/dL — ABNORMAL HIGH (ref 6–23)
Calcium: 9 mg/dL (ref 8.4–10.5)
Creatinine, Ser: 1.07 mg/dL (ref 0.50–1.10)
GFR calc Af Amer: 54 mL/min — ABNORMAL LOW (ref >90)
GFR calc non Af Amer: 47 mL/min — ABNORMAL LOW (ref >90)

## 2012-09-13 LAB — CBC
HCT: 25 % — ABNORMAL LOW (ref 36.0–46.0)
MCHC: 31.2 g/dL (ref 30.0–36.0)
RDW: 17.4 % — ABNORMAL HIGH (ref 11.5–15.5)

## 2012-09-13 LAB — GLUCOSE, CAPILLARY
Glucose-Capillary: 150 mg/dL — ABNORMAL HIGH (ref 70–99)
Glucose-Capillary: 199 mg/dL — ABNORMAL HIGH (ref 70–99)
Glucose-Capillary: 204 mg/dL — ABNORMAL HIGH (ref 70–99)

## 2012-09-13 MED ORDER — TORSEMIDE 10 MG PO TABS
10.0000 mg | ORAL_TABLET | Freq: Every day | ORAL | Status: DC
  Administered 2012-09-13 – 2012-09-15 (×3): 10 mg via ORAL
  Filled 2012-09-13 (×3): qty 1

## 2012-09-13 MED ORDER — WARFARIN SODIUM 2 MG PO TABS
2.0000 mg | ORAL_TABLET | Freq: Once | ORAL | Status: AC
  Administered 2012-09-13: 2 mg via ORAL
  Filled 2012-09-13: qty 1

## 2012-09-13 NOTE — Progress Notes (Signed)
Patient set up on CPAP. Auto titrate 5cm H2O min, and 20cm H2O max with a full face mask and 2L of oxygen bled in. Sterile water added to fill line. Patient states that she is comfortable but does not like the mask and machine. She states that she will wear as long as she can tolerate. RT informed her to call if she needed any assistance.

## 2012-09-13 NOTE — Progress Notes (Signed)
[facility] PROGRESS NOTE  Gina Ashley ZOX:[id number] DOB: 11-May-1927 DOA: 08/27/2012 PCP: Ernestine Conrad, MD  Assessment/Plan: 1-Acute respiratory failure due to OSA, hypercapnia and CHF: -Improved. -Patient extubated on 09/11/12 and since then w/o significant complaints of SOB. -Will continue IS and QHS Bipap -Continue demadex and daily assessment of fluid status -Continue flovent and xopenex -continue chest toiletry  2-Klebsiela and E.coli UTI (lower urinary tract infection): -continue rocephin.  3-Multiple fractures of both lower extremities: -S/p right ORIF and Left external fixation. -will follow ortho rec's -PT on board  4-DM: -continue SSI  5-OBSTRUCTIVE SLEEP APNEA: -continue BIPAP QHS  6-GERD: -Continue PPI  7-PAF (paroxysmal atrial fibrillation): -cardiology on board -will continue diltiazem and metoprolol (PO)  -coumadin per pharmacy; Once INR > 2 no heparin.  8-HTN (hypertension): -stable; continue current regimen and low sodium diet.  9-chronic resp failure secondary to COPD (chronic obstructive pulmonary disease): -continue inhalers. No wheezing. -continue daily oxygen supplementation  10-Hyponatremia -stable; will follow electrolytes -continue diuresis  11-CKD (chronic kidney disease; stage III) -stable and at baseline. -Follow Cr trend around diuresis.  12-Anemia -Hgb 7.8; no signs of overt bleeding. -Will monitor and transfuse if Hgb less 7.0 -continue aranesp  13-Hypokalemia -Repleted   Code Status: DNR Family Communication: daughter at bedside Disposition Plan: will need [facility] vs SNF   Consultants:  cardiology  Antibiotics:  Rocephin day 2/5  HPI/Subjective: Afebrile, no SOB, complaining of pain on her legs. Refused BIPAP yesterday night.  Objective: Filed Vitals:   09/12/12 1330 09/12/12 1927 09/12/12 2046 09/13/12 0517  BP: 127/59  130/79 153/55  Pulse: 99  70 76  Temp: 98.3 F (36.8 C)  97.8 F (36.6 C) 97.8  F (36.6 C)  TempSrc: Oral  Oral Oral  Resp: 20  18 16   Height:      Weight:    99 kg (218 lb 4.1 oz)  SpO2: 100% 98% 100% 100%    Intake/Output Summary (Last 24 hours) at 09/13/12 1429 Last data filed at 09/13/12 0840  Gross per 24 hour  Intake 1399.33 ml  Output   1151 ml  Net 248.33 ml   Filed Weights   09/11/12 0500 09/12/12 0400 09/13/12 0517  Weight: 98.5 kg (217 lb 2.5 oz) 85.5 kg (188 lb 7.9 oz) 99 kg (218 lb 4.1 oz)    Exam:   General:  Cooperative to examination; afebrile, no CP or SOB; AAOX3  Cardiovascular: Irregular, no rubs or gallops  Respiratory: decreased BS at bases, no wheezing; 2 ++ LE edema  Abdomen: soft, NT, ND, positive BS  Extremities: external fixator on LLE; clean wound from ORIF on her Right tight; 2++edema  Neuro: AAOX3; CN intact; decreased overall MS and motion due to fracture and pain.  Data Reviewed: Basic Metabolic Panel:  Lab 09/13/12 8119 09/12/12 0456 09/11/12 0157 09/10/12 0501 09/10/12 0500 09/09/12 1300 09/09/12 0500 09/09/12 0034 09/08/12 0345  NA 137 139 143 142 143 -- -- -- --  K 3.8 2.9* 2.8* 3.1* 3.1* -- -- -- --  CL 101 100 101 98 99 -- -- -- --  CO2 27 32 33* 35* 35* -- -- -- --  GLUCOSE 175* 128* 184* 150* 149* -- -- -- --  BUN 38* 41* 51* 47* 47* -- -- -- --  CREATININE 1.07 1.06 1.29* 1.33* 1.36* -- -- -- --  CALCIUM 9.0 9.1 9.2 9.2 9.2 -- -- -- --  MG -- -- 2.2 2.4 -- 2.4 -- 2.6* --  PHOS -- 2.7 2.9 --  2.7 -- 2.8 -- 3.2   Liver Function Tests:  Lab 09/12/12 0456 09/11/12 0157 09/10/12 0500 09/09/12 0500 09/09/12 0034  AST 17 -- -- -- 22  ALT <5 -- -- -- 8  ALKPHOS 101 -- -- -- 134*  BILITOT 0.8 -- -- -- 1.3*  PROT 6.6 -- -- -- 7.1  ALBUMIN 2.9*2.9* 2.7* 2.7* 3.2* 3.3*   CBC:  Lab 09/13/12 0340 09/12/12 0456 09/11/12 0157 09/10/12 1600 09/10/12 0501  WBC 9.0 10.6* 9.2 11.4* 11.4*  NEUTROABS -- -- -- -- --  HGB 7.8* 7.8* 7.7* 8.1* 8.1*  HCT 25.0* 25.2* 24.8* 26.1* 25.3*  MCV 87.1 87.8 87.3 87.9 86.3    PLT 246 246 254 277 255   Cardiac Enzymes:  Lab 09/09/12 0034  CKTOTAL 62  CKMB 1.9  CKMBINDEX --  TROPONINI <0.30   CBG:  Lab 09/13/12 0736 09/12/12 2111 09/12/12 1642 09/12/12 1100 09/12/12 0801  GLUCAP 150* 179* 156* 158* 135*    Recent Results (from the past 240 hour(s))  URINE CULTURE     Status: Normal   Collection Time   09/04/12 10:55 PM      Component Value Range Status Comment   Specimen Description URINE, CATHETERIZED   Final    Special Requests NONE   Final    Culture  Setup Time 09/05/2012 07:07   Final    Colony Count >=100,000 COLONIES/ML   Final    Culture     Final    Value: ESCHERICHIA COLI     KLEBSIELLA PNEUMONIAE   Report Status 09/08/2012 FINAL   Final    Organism ID, Bacteria ESCHERICHIA COLI   Final    Organism ID, Bacteria KLEBSIELLA PNEUMONIAE   Final   CULTURE, BLOOD (ROUTINE X 2)     Status: Normal (Preliminary result)   Collection Time   09/09/12 12:45 PM      Component Value Range Status Comment   Specimen Description BLOOD LEFT HAND   Final    Special Requests BOTTLES DRAWN AEROBIC ONLY 2CC   Final    Culture  Setup Time 09/09/2012 23:51   Final    Culture     Final    Value:        BLOOD CULTURE RECEIVED NO GROWTH TO DATE CULTURE WILL BE HELD FOR 5 DAYS BEFORE ISSUING A FINAL NEGATIVE REPORT   Report Status PENDING   Incomplete   CULTURE, BLOOD (ROUTINE X 2)     Status: Normal (Preliminary result)   Collection Time   09/09/12 12:55 PM      Component Value Range Status Comment   Specimen Description BLOOD LEFT ARM   Final    Special Requests BOTTLES DRAWN AEROBIC AND ANAEROBIC 6CC   Final    Culture  Setup Time 09/09/2012 23:50   Final    Culture     Final    Value:        BLOOD CULTURE RECEIVED NO GROWTH TO DATE CULTURE WILL BE HELD FOR 5 DAYS BEFORE ISSUING A FINAL NEGATIVE REPORT   Report Status PENDING   Incomplete      Studies: No results found.  Scheduled Meds:   . antiseptic oral rinse  15 mL Mouth Rinse QID  .  cefTRIAXone (ROCEPHIN)  IV  1 g Intravenous Q24H  . chlorhexidine  15 mL Mouth Rinse BID  . darbepoetin (ARANESP) injection - NON-DIALYSIS  100 mcg Subcutaneous Q Fri-1800  . diltiazem  60 mg Oral QID  . fluticasone  1 puff Inhalation BID  . heparin subcutaneous  5,000 Units Subcutaneous Q8H  . insulin aspart  0-15 Units Subcutaneous TID WC  . levalbuterol  0.63 mg Nebulization BID  . lidocaine  2 patch Transdermal Q24H  . metoprolol tartrate  50 mg Oral BID  . pantoprazole  40 mg Oral Daily  . sodium chloride  10-40 mL Intracatheter Q12H  . torsemide  10 mg Oral Daily  . warfarin  2 mg Oral ONCE-1800  . [COMPLETED] warfarin  5 mg Oral ONCE-1800  . Warfarin - Pharmacist Dosing Inpatient   Does not apply q1800   Continuous Infusions:   . sodium chloride 20 mL/hr at 09/10/12 1351    Time spent: >30 minutes    Mollie Rossano  [facility] Pager [phone]. If 8PM-8AM, please contact night-coverage at [email or web address], password [facility] 09/13/2012, 2:29 PM  LOS: 17 days

## 2012-09-13 NOTE — Progress Notes (Signed)
ANTICOAGULATION CONSULT NOTE - Follow Up  Pharmacy Consult for warfarin Indication: atrial fibrillation  Allergies  Allergen Reactions  . Morphine Sulfate Other (See Comments)    REACTION: change in personality  . Remeron (Mirtazapine) Other (See Comments)    Altered mental status, lethargy  . Tuna (Fish Allergy)   . Penicillins Rash    Tolerates Ancef.    Patient Measurements: Height: 5\' 3"  (160 cm) Weight: 218 lb 4.1 oz (99 kg) (WEIGHT WAS TAKEN IN BED WITH EXTERAL FIXTATION) IBW/kg (Calculated) : 52.4   Vital Signs: Temp: 97.8 F (36.6 C) (11/10 0517) Temp src: Oral (11/10 0517) BP: 153/55 mmHg (11/10 0517) Pulse Rate: 76  (11/10 0517)  Labs:  Basename 09/13/12 0340 09/12/12 0456 09/11/12 0157  HGB 7.8* 7.8* --  HCT 25.0* 25.2* 24.8*  PLT 246 246 254  APTT -- -- --  LABPROT 20.0* -- --  INR 1.77* -- --  HEPARINUNFRC -- -- --  CREATININE 1.07 1.06 1.29*  CKTOTAL -- -- --  CKMB -- -- --  TROPONINI -- -- --    Estimated Creatinine Clearance: 44.7 ml/min (by C-G formula based on Cr of 1.07).   Medications:  Scheduled:     . antiseptic oral rinse  15 mL Mouth Rinse QID  . cefTRIAXone (ROCEPHIN)  IV  1 g Intravenous Q24H  . chlorhexidine  15 mL Mouth Rinse BID  . darbepoetin (ARANESP) injection - NON-DIALYSIS  100 mcg Subcutaneous Q Fri-1800  . diltiazem  60 mg Oral QID  . fluticasone  1 puff Inhalation BID  . heparin subcutaneous  5,000 Units Subcutaneous Q8H  . insulin aspart  0-15 Units Subcutaneous TID WC  . levalbuterol  0.63 mg Nebulization BID  . lidocaine  2 patch Transdermal Q24H  . metoprolol tartrate  50 mg Oral BID  . pantoprazole  40 mg Oral Daily  . sodium chloride  10-40 mL Intracatheter Q12H  . [COMPLETED] warfarin  5 mg Oral ONCE-1800  . Warfarin - Pharmacist Dosing Inpatient   Does not apply q1800  . [DISCONTINUED] feeding supplement  30 mL Oral TID WC  . [DISCONTINUED] insulin aspart  2-6 Units Subcutaneous Q4H  . [DISCONTINUED]  metoprolol  2.5 mg Intravenous Q6H  . [DISCONTINUED] pantoprazole  40 mg Oral Daily  . [DISCONTINUED] pantoprazole (PROTONIX) IV  40 mg Intravenous Daily   Infusions:     . sodium chloride 20 mL/hr at 09/10/12 1351    Assessment:  78 yo with multiple medical conditions s/p ORIF periprosthetic right fracture and external fixation left leg on 11/6  Patient was on warfarin PTA for hx afib, warfarin 5.5mg  daily PTA  IV heparin was being used post op initially but oozing from surgical site resulted and was changed SQ heparin at ppx dosing due to further bleeding risk  Per notes, bleeding appears resolved, plan to continue SQ heparin and restart warfarin on 11/9  CBC low but stable  INR subtherapeutic, as expected after one dose of warfarin 5mg  last night. What was not expected was an INR of 1.77 this am, despite last documented warfarin dose given on 10/23 (per Med Rec). INR remained therapeutic/supratherapeutic for several days after 10/23 dose, despite no warfarin being given after. Last INR was 1.18 on 11/4. Suspect patient may be very sensitive to warfarin effects, therefore, will use lower warfarin dose tonight and monitor INR trend.  Goal of Therapy:  INR 2-3   Plan:  1) Warfarin 2mg  tonight 2) Daily INR 3) D/C Sq heparin when  INR > 2  Darrol Angel, PharmD Pager: [phone] 09/13/2012 8:09 AM

## 2012-09-14 DIAGNOSIS — E876 Hypokalemia: Secondary | ICD-10-CM

## 2012-09-14 DIAGNOSIS — R0989 Other specified symptoms and signs involving the circulatory and respiratory systems: Secondary | ICD-10-CM

## 2012-09-14 LAB — BASIC METABOLIC PANEL
Chloride: 99 mEq/L (ref 96–112)
Creatinine, Ser: 0.97 mg/dL (ref 0.50–1.10)
GFR calc Af Amer: 61 mL/min — ABNORMAL LOW (ref >90)

## 2012-09-14 LAB — PROTIME-INR
INR: 1.76 — ABNORMAL HIGH (ref 0.00–1.49)
Prothrombin Time: 19.9 seconds — ABNORMAL HIGH (ref 11.6–15.2)

## 2012-09-14 LAB — CBC
MCHC: 31.9 g/dL (ref 30.0–36.0)
Platelets: 268 10*3/uL (ref 150–400)
RDW: 17.2 % — ABNORMAL HIGH (ref 11.5–15.5)
WBC: 9.6 10*3/uL (ref 4.0–10.5)

## 2012-09-14 LAB — GLUCOSE, CAPILLARY
Glucose-Capillary: 182 mg/dL — ABNORMAL HIGH (ref 70–99)
Glucose-Capillary: 178 mg/dL — ABNORMAL HIGH (ref 70–99)
Glucose-Capillary: 191 mg/dL — ABNORMAL HIGH (ref 70–99)

## 2012-09-14 MED ORDER — GABAPENTIN 300 MG PO CAPS
300.0000 mg | ORAL_CAPSULE | Freq: Three times a day (TID) | ORAL | Status: DC
  Administered 2012-09-14 – 2012-09-15 (×4): 300 mg via ORAL
  Filled 2012-09-14 (×5): qty 1

## 2012-09-14 MED ORDER — WARFARIN SODIUM 4 MG PO TABS
4.0000 mg | ORAL_TABLET | Freq: Once | ORAL | Status: AC
  Administered 2012-09-14: 4 mg via ORAL
  Filled 2012-09-14: qty 1

## 2012-09-14 MED ORDER — HYDROMORPHONE HCL PF 1 MG/ML IJ SOLN: 0.5000 mg | Freq: Four times a day (QID) | INTRAMUSCULAR | Status: DC | PRN

## 2012-09-14 MED ORDER — ENSURE COMPLETE PO LIQD
237.0000 mL | Freq: Two times a day (BID) | ORAL | Status: DC
  Administered 2012-09-14 – 2012-09-15 (×3): 237 mL via ORAL

## 2012-09-14 MED ORDER — METHOCARBAMOL 500 MG PO TABS
500.0000 mg | ORAL_TABLET | Freq: Four times a day (QID) | ORAL | Status: DC | PRN
  Administered 2012-09-14 – 2012-09-15 (×3): 500 mg via ORAL
  Filled 2012-09-14 (×4): qty 1

## 2012-09-14 NOTE — Progress Notes (Signed)
Pt. Seen and examined. Agree with the NP/PA-C note as written. Tolerated surgery without incident. In rate-controlled a-fib at this point. Warfarin has been restarted. Will arrange follow-up in our office and sign-off. Call with questions.  Chrystie Nose, MD, [facility] Attending Cardiologist [facility]

## 2012-09-14 NOTE — Progress Notes (Signed)
[facility] PROGRESS NOTE  Gina Ashley JYN:[id number] DOB: Nov 08, 1926 DOA: 08/27/2012 PCP: Ernestine Conrad, MD  Assessment/Plan: 1-Acute respiratory failure due to OSA, hypercapnia and CHF:  -Resolved. Patient extubated on 09/11/12 and since then w/o significant complaints of SOB.  -Will continue IS and QHS Bipap . Sats 98% on 2L -Continue demadex. Volume status +578 for last 24hrs. Monitor closely.  -Continue flovent and xopenex  -continue chest toiletry   2-Klebsiela and E.coli UTI (lower urinary tract infection):  -continue rocephin day #3/5 .  3-Multiple fractures of both lower extremities:  -S/p right ORIF and Left external fixation. Continues with pain and complaints "tightness" around surgical site on left. Continue ice and pain meds.  -will follow ortho rec's  -PT on board   4-DM:  -continue SSI CBG 152-204.   5-OBSTRUCTIVE SLEEP APNEA:  -continue BIPAP QHS   6-GERD:  -Continue PPI   7-PAF (paroxysmal atrial fibrillation):  -cardiology on board  -will continue diltiazem and metoprolol (PO)  -coumadin per pharmacy; INR 1.76 today.  Once INR > 2 no heparin.   8-HTN (hypertension):  - fair control. Will monitor and  continue current regimen and low sodium diet.   9-chronic resp failure secondary to COPD (chronic obstructive pulmonary disease):  -at baseline. continue inhalers. No wheezing.  -continue daily oxygen supplementation   10-Hyponatremia  -stable; will follow electrolytes  -continue diuresis   11-CKD (chronic kidney disease; stage III)  -stable and at baseline.  -Follow Cr trend around diuresis.   12-Anemia  -Hgb 8.2 today; no signs of overt bleeding.  -Will monitor and transfuse if Hgb less 7.0  -continue aranesp   13-Hypokalemia  -Repleted . Monitor.    Code Status: DNR Family Communication: Daughter at bedside Disposition Plan: likely to [facility] as soon a bed is  available.   Consultants:  Cardiology  orthopedics  Procedures:  ORIF on 11/6  Antibiotics:  Rocephin day 3/5  HPI/Subjective: Awake alert oriented. Complains of "tightness" in left leg surgical site. Requests being repositioned more often and up to chair.   Objective: Filed Vitals:   09/13/12 1310 09/13/12 1935 09/13/12 2225 09/14/12 0657  BP: 150/77  172/85 166/88  Pulse: 77  74 80  Temp: 97.9 F (36.6 C)  97.4 F (36.3 C) 98.2 F (36.8 C)  TempSrc: Oral  Oral Oral  Resp: 16  18 20   Height:      Weight:    100 kg (220 lb 7.4 oz)  SpO2: 97% 95% 99% 98%    Intake/Output Summary (Last 24 hours) at 09/14/12 0801 Last data filed at 09/14/12 0704  Gross per 24 hour  Intake 1328.33 ml  Output   1450 ml  Net -121.67 ml   Filed Weights   09/12/12 0400 09/13/12 0517 09/14/12 0657  Weight: 85.5 kg (188 lb 7.9 oz) 99 kg (218 lb 4.1 oz) 100 kg (220 lb 7.4 oz)    Exam:   General:  Awake alert oreinted x3  Cardiovascular: irregularly irregular, No MGR PPP 1-2+LEE  Respiratory: normal effort but somewhat distant. No wheezing/rhonchi/no crackles  Abdomen: obese, soft +BS non-tender to palpation  Extremities: dressing to right ORIF clean and dry, External fixator on LLE; tender to palpation  Neuro: non focal  Data Reviewed: Basic Metabolic Panel:  Lab 09/14/12 8657 09/13/12 0340 09/12/12 0456 09/11/12 0157 09/10/12 0501 09/10/12 0500 09/09/12 1300 09/09/12 0500 09/09/12 0034 09/08/12 0345  NA 136 137 139 143 142 -- -- -- -- --  K 3.5 3.8 2.9* 2.8* 3.1* -- -- -- -- --  CL 99 101 100 101 98 -- -- -- -- --  CO2 28 27 32 33* 35* -- -- -- -- --  GLUCOSE 196* 175* 128* 184* 150* -- -- -- -- --  BUN 27* 38* 41* 51* 47* -- -- -- -- --  CREATININE 0.97 1.07 1.06 1.29* 1.33* -- -- -- -- --  CALCIUM 8.8 9.0 9.1 9.2 9.2 -- -- -- -- --  MG -- -- -- 2.2 2.4 -- 2.4 -- 2.6* --  PHOS -- -- 2.7 2.9 -- 2.7 -- 2.8 -- 3.2   Liver Function Tests:  Lab 09/12/12 0456 09/11/12  0157 09/10/12 0500 09/09/12 0500 09/09/12 0034  AST 17 -- -- -- 22  ALT <5 -- -- -- 8  ALKPHOS 101 -- -- -- 134*  BILITOT 0.8 -- -- -- 1.3*  PROT 6.6 -- -- -- 7.1  ALBUMIN 2.9*2.9* 2.7* 2.7* 3.2* 3.3*   CBC:  Lab 09/13/12 0340 09/12/12 0456 09/11/12 0157 09/10/12 1600 09/10/12 0501  WBC 9.0 10.6* 9.2 11.4* 11.4*  NEUTROABS -- -- -- -- --  HGB 7.8* 7.8* 7.7* 8.1* 8.1*  HCT 25.0* 25.2* 24.8* 26.1* 25.3*  MCV 87.1 87.8 87.3 87.9 86.3  PLT 246 246 254 277 255   Cardiac Enzymes:  Lab 09/09/12 0034  CKTOTAL 62  CKMB 1.9  CKMBINDEX --  TROPONINI <0.30   CBG:  Lab 09/14/12 0731 09/13/12 2228 09/13/12 1627 09/13/12 1208 09/13/12 0736  GLUCAP 182* 154* 204* 199* 150*    Recent Results (from the past 240 hour(s))  URINE CULTURE     Status: Normal   Collection Time   09/04/12 10:55 PM      Component Value Range Status Comment   Specimen Description URINE, CATHETERIZED   Final    Special Requests NONE   Final    Culture  Setup Time 09/05/2012 07:07   Final    Colony Count >=100,000 COLONIES/ML   Final    Culture     Final    Value: ESCHERICHIA COLI     KLEBSIELLA PNEUMONIAE   Report Status 09/08/2012 FINAL   Final    Organism ID, Bacteria ESCHERICHIA COLI   Final    Organism ID, Bacteria KLEBSIELLA PNEUMONIAE   Final   CULTURE, BLOOD (ROUTINE X 2)     Status: Normal (Preliminary result)   Collection Time   09/09/12 12:45 PM      Component Value Range Status Comment   Specimen Description BLOOD LEFT HAND   Final    Special Requests BOTTLES DRAWN AEROBIC ONLY 2CC   Final    Culture  Setup Time 09/09/2012 23:51   Final    Culture     Final    Value:        BLOOD CULTURE RECEIVED NO GROWTH TO DATE CULTURE WILL BE HELD FOR 5 DAYS BEFORE ISSUING A FINAL NEGATIVE REPORT   Report Status PENDING   Incomplete   CULTURE, BLOOD (ROUTINE X 2)     Status: Normal (Preliminary result)   Collection Time   09/09/12 12:55 PM      Component Value Range Status Comment   Specimen Description  BLOOD LEFT ARM   Final    Special Requests BOTTLES DRAWN AEROBIC AND ANAEROBIC [facility]   Final    Culture  Setup Time 09/09/2012 23:50   Final    Culture     Final    Value:        BLOOD CULTURE RECEIVED NO GROWTH TO DATE CULTURE  WILL BE HELD FOR 5 DAYS BEFORE ISSUING A FINAL NEGATIVE REPORT   Report Status PENDING   Incomplete      Studies: No results found.  Scheduled Meds:   . antiseptic oral rinse  15 mL Mouth Rinse QID  . cefTRIAXone (ROCEPHIN)  IV  1 g Intravenous Q24H  . chlorhexidine  15 mL Mouth Rinse BID  . darbepoetin (ARANESP) injection - NON-DIALYSIS  100 mcg Subcutaneous Q Fri-1800  . diltiazem  60 mg Oral QID  . fluticasone  1 puff Inhalation BID  . heparin subcutaneous  5,000 Units Subcutaneous Q8H  . insulin aspart  0-15 Units Subcutaneous TID WC  . levalbuterol  0.63 mg Nebulization BID  . lidocaine  2 patch Transdermal Q24H  . metoprolol tartrate  50 mg Oral BID  . pantoprazole  40 mg Oral Daily  . sodium chloride  10-40 mL Intracatheter Q12H  . torsemide  10 mg Oral Daily  . [COMPLETED] warfarin  2 mg Oral ONCE-1800  . warfarin  4 mg Oral ONCE-1800  . Warfarin - Pharmacist Dosing Inpatient   Does not apply q1800   Continuous Infusions:   . sodium chloride 20 mL/hr (09/13/12 2213)    Principal Problem:  *Multiple fractures of both lower extremities Active Problems:  DM  OBSTRUCTIVE SLEEP APNEA  GERD  PAF (paroxysmal atrial fibrillation), was in SR 06/2012   HTN (hypertension)  COPD (chronic obstructive pulmonary disease)  Hyponatremia  CKD (chronic kidney disease)  Pleural effusion  Hypercapnemia  Acute respiratory failure  Chronic diastolic heart failure  Hypokalemia  Alkalosis, metabolic  Pain  UTI (lower urinary tract infection)    Time spent: 30 minutes    Gwenyth Bender NP [facility] If 8PM-8AM, please contact night-coverage at [email or web address], password [facility] 09/14/2012, 8:01 AM  LOS: 18 days    Patient seen, examined and  discussed with NP Toya Smothers; agree with assessment and plan as formulated on above note. Patient w/o CP, SOB, fever or chills, no abdominal pain and no signs of overt bleeding. Complaining of pain on her LE bilaterally, but especially where she had external fixator in place (LLE). Will adjust pain meds, continue Demadex, continue antibiotics for UTI; continue cardizem, metoprolol and coumadin. Also on BiPAP QHS. Most likely to [facility] when bed available.  Dayyan Krist [phone]

## 2012-09-14 NOTE — Progress Notes (Signed)
Gina Ashley has had the COPD GOLD Care Plan initiated and has been enrolled in the COPD GOLD pilot.  I went to offer Ms. Behney the option of bedside delivery of her discharge medications and was informed that she was going to go to a long term acute care site.  She will not qualify for the bedside delivery of medications.

## 2012-09-14 NOTE — Progress Notes (Signed)
Subjective:  Looks good, up in bed, awake and alert, no distress.  Objective:  Vital Signs in the last 24 hours: Temp:  [97.4 F (36.3 C)-98.6 F (37 C)] 98.6 F (37 C) (11/11 1300) Pulse Rate:  [74-80] 77  (11/11 1300) Resp:  [16-20] 16  (11/11 1300) BP: (129-172)/(80-88) 129/80 mmHg (11/11 1300) SpO2:  [95 %-100 %] 99 % (11/11 1300) Weight:  [100 kg (220 lb 7.4 oz)] 100 kg (220 lb 7.4 oz) (11/11 0657)  Intake/Output from previous day:  Intake/Output Summary (Last 24 hours) at 09/14/12 1743 Last data filed at 09/14/12 1525  Gross per 24 hour  Intake    679 ml  Output   1600 ml  Net   -921 ml    Physical Exam: General appearance: alert, cooperative, no distress and moderately obese Lungs: clear to auscultation bilaterally Heart: irregularly irregular rhythm   Rate: 80  Rhythm: atrial fibrillation  Lab Results:  Basename 09/14/12 0905 09/13/12 0340  WBC 9.6 9.0  HGB 8.2* 7.8*  PLT 268 246    Basename 09/14/12 0305 09/13/12 0340  NA 136 137  K 3.5 3.8  CL 99 101  CO2 28 27  GLUCOSE 196* 175*  BUN 27* 38*  CREATININE 0.97 1.07   No results found for this basename: TROPONINI:2,CK,MB:2 in the last 72 hours Hepatic Function Panel  Basename 09/12/12 0456  PROT 6.6  ALBUMIN 2.9*2.9*  AST 17  ALT <5  ALKPHOS 101  BILITOT 0.8  BILIDIR --  IBILI --   No results found for this basename: CHOL in the last 72 hours  Basename 09/14/12 0305  INR 1.76*    Imaging: No results found.  Cardiac Studies:  Assessment/Plan:   Principal Problem:  *Multiple fractures of both lower extremities Active Problems:  DM  OBSTRUCTIVE SLEEP APNEA  GERD  PAF (paroxysmal atrial fibrillation), was in SR 06/2012   HTN (hypertension)  COPD (chronic obstructive pulmonary disease)  Hyponatremia  CKD (chronic kidney disease)  Pleural effusion  Hypercapnemia  Acute respiratory failure  Chronic diastolic heart failure  Hypokalemia  Alkalosis, metabolic  Pain  UTI  (lower urinary tract infection)  Plan- Coumadin has been resumed. Her rate is controlled on beta blocker and Diltiazem. We will arrange for follow up as an OP.    Corine Shelter PA-C 09/14/2012, 5:43 PM

## 2012-09-14 NOTE — Progress Notes (Signed)
ANTICOAGULATION CONSULT NOTE - Follow Up  Pharmacy Consult for warfarin Indication: atrial fibrillation  Allergies  Allergen Reactions  . Morphine Sulfate Other (See Comments)    REACTION: change in personality  . Remeron (Mirtazapine) Other (See Comments)    Altered mental status, lethargy  . Tuna (Fish Allergy)   . Penicillins Rash    Tolerates Ancef.    Patient Measurements: Height: 5\' 3"  (160 cm) Weight: 220 lb 7.4 oz (100 kg) (BED SCALE WITH EXTERNAL FIXTATION) IBW/kg (Calculated) : 52.4   Vital Signs: Temp: 98.2 F (36.8 C) (11/11 0657) Temp src: Oral (11/11 0657) BP: 166/88 mmHg (11/11 0657) Pulse Rate: 80  (11/11 0657)  Labs:  Basename 09/14/12 0305 09/13/12 0340 09/12/12 0456  HGB -- 7.8* 7.8*  HCT -- 25.0* 25.2*  PLT -- 246 246  APTT -- -- --  LABPROT 19.9* 20.0* --  INR 1.76* 1.77* --  HEPARINUNFRC -- -- --  CREATININE 0.97 1.07 1.06  CKTOTAL -- -- --  CKMB -- -- --  TROPONINI -- -- --    Estimated Creatinine Clearance: 49.5 ml/min (by C-G formula based on Cr of 0.97).   Medications:  Scheduled:     . antiseptic oral rinse  15 mL Mouth Rinse QID  . cefTRIAXone (ROCEPHIN)  IV  1 g Intravenous Q24H  . chlorhexidine  15 mL Mouth Rinse BID  . darbepoetin (ARANESP) injection - NON-DIALYSIS  100 mcg Subcutaneous Q Fri-1800  . diltiazem  60 mg Oral QID  . fluticasone  1 puff Inhalation BID  . heparin subcutaneous  5,000 Units Subcutaneous Q8H  . insulin aspart  0-15 Units Subcutaneous TID WC  . levalbuterol  0.63 mg Nebulization BID  . lidocaine  2 patch Transdermal Q24H  . metoprolol tartrate  50 mg Oral BID  . pantoprazole  40 mg Oral Daily  . sodium chloride  10-40 mL Intracatheter Q12H  . torsemide  10 mg Oral Daily  . [COMPLETED] warfarin  2 mg Oral ONCE-1800  . Warfarin - Pharmacist Dosing Inpatient   Does not apply q1800   Infusions:     . sodium chloride 20 mL/hr (09/13/12 2213)    Assessment:  78 yo with multiple medical  conditions s/p ORIF periprosthetic right fracture and external fixation left leg on 11/6  Patient was on warfarin PTA for hx afib, warfarin 5.5mg  daily PTA  IV heparin was being used post op initially but oozing from surgical site resulted and was changed SQ heparin at ppx dosing due to further bleeding risk  Per notes, bleeding appears resolved, warfarin restarted 11/9 and sq heparin to cont until INR tx  Hgb/Hct low but stable. Pltc wnl/stable  INR subtherapeutic, no movement w/ 2mg  dose of Coumadin last night  Goal of Therapy:  INR 2-3   Plan:  1) Warfarin 4mg  tonight 2) Daily INR 3) D/C Sq heparin when INR > 2  Gwen Her PharmD  [phone] 09/14/2012 7:21 AM

## 2012-09-14 NOTE — Progress Notes (Signed)
Nutrition Follow-up  Intervention:   Ensure complete bid  Assessment:   Nurse reports that patient did not eat as well this am.  Intake overall is fair-good.  Would benefit from a nutritional supplement.  Diet Order:  Low sodium  Meds: Scheduled Meds:   . antiseptic oral rinse  15 mL Mouth Rinse QID  . cefTRIAXone (ROCEPHIN)  IV  1 g Intravenous Q24H  . chlorhexidine  15 mL Mouth Rinse BID  . darbepoetin (ARANESP) injection - NON-DIALYSIS  100 mcg Subcutaneous Q Fri-1800  . diltiazem  60 mg Oral QID  . fluticasone  1 puff Inhalation BID  . gabapentin  300 mg Oral TID  . heparin subcutaneous  5,000 Units Subcutaneous Q8H  . insulin aspart  0-15 Units Subcutaneous TID WC  . lidocaine  2 patch Transdermal Q24H  . metoprolol tartrate  50 mg Oral BID  . pantoprazole  40 mg Oral Daily  . sodium chloride  10-40 mL Intracatheter Q12H  . torsemide  10 mg Oral Daily  . [COMPLETED] warfarin  2 mg Oral ONCE-1800  . warfarin  4 mg Oral ONCE-1800  . Warfarin - Pharmacist Dosing Inpatient   Does not apply q1800  . [DISCONTINUED] levalbuterol  0.63 mg Nebulization BID   Continuous Infusions:   . sodium chloride 20 mL/hr (09/13/12 2213)   PRN Meds:.bisacodyl, HYDROcodone-acetaminophen, levalbuterol, methocarbamol, promethazine, sodium chloride, [DISCONTINUED] fentaNYL   CMP     Component Value Date/Time   NA 136 09/14/2012 0305   K 3.5 09/14/2012 0305   CL 99 09/14/2012 0305   CO2 28 09/14/2012 0305   GLUCOSE 196* 09/14/2012 0305   BUN 27* 09/14/2012 0305   CREATININE 0.97 09/14/2012 0305   CALCIUM 8.8 09/14/2012 0305   PROT 6.6 09/12/2012 0456   ALBUMIN 2.9* 09/12/2012 0456   ALBUMIN 2.9* 09/12/2012 0456   AST 17 09/12/2012 0456   ALT <5 09/12/2012 0456   ALKPHOS 101 09/12/2012 0456   BILITOT 0.8 09/12/2012 0456   GFRNONAA 53* 09/14/2012 0305   GFRAA 61* 09/14/2012 0305    CBG (last 3)   Basename 09/14/12 1135 09/14/12 0731 09/13/12 2228  GLUCAP 178* 182* 154*      Intake/Output Summary (Last 24 hours) at 09/14/12 1340 Last data filed at 09/14/12 0704  Gross per 24 hour  Intake 488.33 ml  Output   1000 ml  Net -511.67 ml    Weight Status:  100 kg (range: 85.5 kg-113 kg)  Re-estimated needs:  1700-1900 kcal, 100-110 gm protein  Nutrition Dx:  Inadequate oral intake-progressing  Goal:  Meet 100% estimated needs with meals and supplements  Monitor:  Intake, labs, weight.  Oran Rein, RD, LDN Clinical Inpatient Dietitian Pager:  [phone] Weekend and after hours pager:  [phone]

## 2012-09-14 NOTE — Care Management Note (Addendum)
    Page 1 of 2   09/15/2012     4:17:51 PM   CARE MANAGEMENT NOTE 09/15/2012  Patient:  Gina Ashley   Account Number:  [id number]  Date Initiated:  08/28/2012  Documentation initiated by:  DAVIS,RHONDA  Subjective/Objective Assessment:   pt fell at snf fx rt upper tibia inloving the tkr hardward and the left tib-fib cxr show lupl. effusions.     Action/Plan:   snf vs ltach   Anticipated DC Date:  09/15/2012   Anticipated DC Plan:  LONG TERM ACUTE CARE (LTAC)  In-house referral  Clinical Social Worker      DC Planning Services  CM consult      [facility] Choice  NA   Choice offered to / List presented to:  NA   DME arranged  NA      DME agency  NA     HH arranged  NA      HH agency  NA   Status of service:  Completed, signed off Medicare Important Message given?  NA - LOS <3 / Initial given by admissions (If response is "NO", the following Medicare IM given date fields will be blank) Date Medicare IM given:   Date Additional Medicare IM given:    Discharge Disposition:  LONG TERM ACUTE CARE (LTAC)  Per UR Regulation:  Reviewed for med. necessity/level of care/duration of stay  If discussed at Long Length of Stay Meetings, dates discussed:   09/03/2012  09/15/2012    Comments:  09/15/12 Gina Linden RN,BSN NCM [phone] RECEIVED CALL FROM [facility] MOODY W/BED-RM 410.STAFF NURSE MADE AWARE OF BED & MD-DR. CROWLEY,CALL REPORT TO TEL#[phone] X [phone],THEN CALL [facility] [phone] FOR AMBULANCE TRANSPORTATION.MD UPDATED. STILL BEING REVIEWED FOR LTAC.NO BED @ [facility].SPOKE TO PATIENT ABOUT [facility] FOR [facility] THEY WERE IN AGREEMENT.MD UPDATED.[facility] CONTACTED FOR REFERRAL, & BEDS AVAILABLE,FAXED FACE SHEET,ALL PROGRESS NOTES,H&P,MAR.COBRA FORM COMPLETED.AWAITING ON BED#.[facility] FOR TRANSPORTATION [phone], NURSE TO CALL REPORT TO [facility] MAIN#[phone](ADMISSIONS COORD)  09/14/12 Gina Villacis RN,BSN NCM [phone] PER [facility]  ([facility]) APPROPRIATE FOR LTAC,BUT NO BEDS AVAILABLE CURRENTLY.WILL CHECK ON BEDS IN AM.MD NOTIFIED. TRANSFER FROM SDU.S/P BILAT ORIF,RESP FAILURE/CHF-BIPAP HS,02 2L,NEBS,UTI-IV ABX.[facility]AC REFERRAL IN.[facility]) FOLLOWING.MD/FAMILY IN AGREEMENT IF AUTHORIZED.COBRA FORM ON CHART FOR MD SIGNATURE.  [id number]/Rhonda Earlene Plater, RN, BSN, CCM: CHART REVIEWED AND UPDATED. Patient is for O.R. today to correct fractures of the lower legs bilaterally/or cancelled due to fld overload and resp status-o2 level dropped into the 70's, o2 increased to Fi02 of 50% NO DISCHARGE NEEDS PRESENT AT THIS TIME. CASE MANAGEMENT [phone]  [id number]/Rhonda Davis,RN,BSn,CCM

## 2012-09-15 LAB — BASIC METABOLIC PANEL
Calcium: 8.9 mg/dL (ref 8.4–10.5)
GFR calc Af Amer: 50 mL/min — ABNORMAL LOW (ref >90)
GFR calc non Af Amer: 43 mL/min — ABNORMAL LOW (ref >90)
Glucose, Bld: 126 mg/dL — ABNORMAL HIGH (ref 70–99)
Potassium: 3.8 mEq/L (ref 3.5–5.1)
Sodium: 137 mEq/L (ref 135–145)

## 2012-09-15 LAB — CULTURE, BLOOD (ROUTINE X 2): Culture: NO GROWTH

## 2012-09-15 LAB — PROTIME-INR
INR: 2.01 — ABNORMAL HIGH (ref 0.00–1.49)
Prothrombin Time: 22 seconds — ABNORMAL HIGH (ref 11.6–15.2)

## 2012-09-15 LAB — CBC
MCH: 27 pg (ref 26.0–34.0)
MCHC: 31.5 g/dL (ref 30.0–36.0)
Platelets: 245 10*3/uL (ref 150–400)
RDW: 17.5 % — ABNORMAL HIGH (ref 11.5–15.5)

## 2012-09-15 LAB — GLUCOSE, CAPILLARY
Glucose-Capillary: 161 mg/dL — ABNORMAL HIGH (ref 70–99)
Glucose-Capillary: 277 mg/dL — ABNORMAL HIGH (ref 70–99)

## 2012-09-15 MED ORDER — WARFARIN SODIUM 4 MG PO TABS
4.0000 mg | ORAL_TABLET | Freq: Once | ORAL | Status: AC
  Administered 2012-09-15: 4 mg via ORAL
  Filled 2012-09-15: qty 1

## 2012-09-15 MED ORDER — PANTOPRAZOLE SODIUM 40 MG PO TBEC: 40.0000 mg | DELAYED_RELEASE_TABLET | Freq: Every day | ORAL | Status: DC

## 2012-09-15 MED ORDER — WARFARIN - PHARMACIST DOSING INPATIENT: Status: DC

## 2012-09-15 MED ORDER — ENSURE COMPLETE PO LIQD: 237.0000 mL | Freq: Two times a day (BID) | ORAL | Status: DC

## 2012-09-15 MED ORDER — BISACODYL 10 MG RE SUPP: 10.0000 mg | Freq: Every day | RECTAL | Status: DC | PRN

## 2012-09-15 MED ORDER — DEXTROSE 5 % IV SOLN: INTRAVENOUS | Status: DC

## 2012-09-15 MED ORDER — DARBEPOETIN ALFA-POLYSORBATE 100 MCG/0.5ML IJ SOLN: 100.0000 ug | INTRAMUSCULAR | Status: DC

## 2012-09-15 MED ORDER — INSULIN ASPART 100 UNIT/ML ~~LOC~~ SOLN: 0.0000 [IU] | Freq: Three times a day (TID) | SUBCUTANEOUS | Status: DC

## 2012-09-15 MED ORDER — FLUTICASONE PROPIONATE HFA 110 MCG/ACT IN AERO: 1.0000 | INHALATION_SPRAY | Freq: Two times a day (BID) | RESPIRATORY_TRACT | Status: DC

## 2012-09-15 MED ORDER — WARFARIN SODIUM 4 MG PO TABS: 4.0000 mg | ORAL_TABLET | Freq: Once | ORAL | Status: DC

## 2012-09-15 MED ORDER — TORSEMIDE 10 MG PO TABS: 10.0000 mg | ORAL_TABLET | Freq: Every day | ORAL | Status: DC

## 2012-09-15 MED ORDER — METOPROLOL TARTRATE 50 MG PO TABS: 50.0000 mg | ORAL_TABLET | Freq: Two times a day (BID) | ORAL | Status: DC

## 2012-09-15 NOTE — Progress Notes (Signed)
ANTICOAGULATION CONSULT NOTE - Follow Up  Pharmacy Consult for warfarin Indication: atrial fibrillation  Allergies  Allergen Reactions  . Morphine Sulfate Other (See Comments)    REACTION: change in personality  . Remeron (Mirtazapine) Other (See Comments)    Altered mental status, lethargy  . Tuna (Fish Allergy)   . Penicillins Rash    Tolerates Ancef.    Patient Measurements: Height: 5\' 3"  (160 cm) Weight: 218 lb 4.1 oz (99 kg) (bed scale) IBW/kg (Calculated) : 52.4   Vital Signs: Temp: 98.5 F (36.9 C) (11/12 0446) Temp src: Oral (11/12 0446) BP: 137/72 mmHg (11/12 0446) Pulse Rate: 73  (11/12 0446)  Labs:  Basename 09/15/12 0415 09/14/12 0905 09/14/12 0305 09/13/12 0340  HGB 8.0* 8.2* -- --  HCT 25.4* 25.7* -- 25.0*  PLT 245 268 -- 246  APTT -- -- -- --  LABPROT 22.0* -- 19.9* 20.0*  INR 2.01* -- 1.76* 1.77*  HEPARINUNFRC -- -- -- --  CREATININE 1.14* -- 0.97 1.07  CKTOTAL -- -- -- --  CKMB -- -- -- --  TROPONINI -- -- -- --    Estimated Creatinine Clearance: 41.9 ml/min (by C-G formula based on Cr of 1.14).   Medications:  Scheduled:     . antiseptic oral rinse  15 mL Mouth Rinse QID  . cefTRIAXone (ROCEPHIN)  IV  1 g Intravenous Q24H  . chlorhexidine  15 mL Mouth Rinse BID  . darbepoetin (ARANESP) injection - NON-DIALYSIS  100 mcg Subcutaneous Q Fri-1800  . diltiazem  60 mg Oral QID  . feeding supplement  237 mL Oral BID BM  . fluticasone  1 puff Inhalation BID  . gabapentin  300 mg Oral TID  . insulin aspart  0-15 Units Subcutaneous TID WC  . lidocaine  2 patch Transdermal Q24H  . metoprolol tartrate  50 mg Oral BID  . pantoprazole  40 mg Oral Daily  . sodium chloride  10-40 mL Intracatheter Q12H  . torsemide  10 mg Oral Daily  . [COMPLETED] warfarin  4 mg Oral ONCE-1800  . Warfarin - Pharmacist Dosing Inpatient   Does not apply q1800  . [DISCONTINUED] heparin subcutaneous  5,000 Units Subcutaneous Q8H  . [DISCONTINUED] levalbuterol  0.63 mg  Nebulization BID   Infusions:     . sodium chloride 20 mL/hr (09/13/12 2213)    Assessment:  78 yo with multiple medical conditions s/p ORIF periprosthetic right fracture and external fixation left leg on 11/6  Patient was on warfarin PTA for hx afib, warfarin 5.5mg  daily PTA  IV heparin was being used post op initially but oozing from surgical site resulted and was changed SQ heparin at ppx dosing due to further bleeding risk  Per notes, bleeding appears resolved, warfarin restarted 11/9 and sq heparin to cont until INR tx  Hgb/Hct low but stable. Pltc wnl/stable  INR therapeutic, increased after 2, 4mg  Coumadin 11/10-11/11    Goal of Therapy:  INR 2-3   Plan:  1) Warfarin 4mg  tonight 2) Daily INR 3) D/C Sq heparin since INR > 2  Gwen Her PharmD  [phone] 09/15/2012 7:15 AM

## 2012-09-15 NOTE — Plan of Care (Signed)
Problem: Phase II Progression Outcomes Goal: Bed to chair transfers BID Outcome: Not Progressing NWB per MD orders. Pt is getting to the chair with the maxi move lift  Problem: Phase III Progression Outcomes Goal: Ambulate BID Outcome: Not Progressing NWB  Problem: Discharge Progression Outcomes Goal: Ambulates safely using assistive device Outcome: Not Progressing NWB

## 2012-09-15 NOTE — Progress Notes (Signed)
Patient ID: Gina Ashley, female   DOB: [birth year], 78 y.o.   MRN: [id number] Subjective: 7 Days Post-Op Procedure(s) (LRB): OPEN REDUCTION INTERNAL FIXATION (ORIF) PERIPROSTHETIC FRACTURE (Right) EXTERNAL FIXATION LEG (Left)    Patient reports pain as mild.  Been relatively comfortable with no acute events.  Heading to LTAC today  Objective:   VITALS:   Filed Vitals:   09/15/12 1252  BP: 120/74  Pulse: 85  Temp: 98.8 F (37.1 C)  Resp: 18    Incision: dressing C/D/I, right and left  LABS  Basename 09/15/12 0415 09/14/12 0905 09/13/12 0340  HGB 8.0* 8.2* 7.8*  HCT 25.4* 25.7* 25.0*  WBC 10.3 9.6 9.0  PLT 245 268 246     Basename 09/15/12 0415 09/14/12 0305 09/13/12 0340  NA 137 136 137  K 3.8 3.5 3.8  BUN 24* 27* 38*  CREATININE 1.14* 0.97 1.07  GLUCOSE 126* 196* 175*     Basename 09/15/12 0415 09/14/12 0305  LABPT -- --  INR 2.01* 1.76*     Assessment/Plan: 7 Days Post-Op Procedure(s) (LRB): OPEN REDUCTION INTERNAL FIXATION (ORIF) PERIPROSTHETIC FRACTURE (Right) EXTERNAL FIXATION LEG (Left)   {Plan: Per Othopaedics, non-weight bearing BLE Daily dry dressing changes to the right thigh wound daily as needed, keep as clean dry as possible For the left LE external fixator pins, please dress with xeroform gauze cut into strips around the pins and place at base of pins then cover with a drain type gauze, split to go around the pins  RTC in 2 weeks Please call with any change in wound drainage particularly as it pertains to the left LE Call with questions to [facility] [phone]

## 2012-09-15 NOTE — Progress Notes (Addendum)
Physical Therapy Treatment Patient Details Name: Taiwana Willison MRN: [id number] DOB: January 07, 1927 Today's Date: 09/15/2012 Time: 4098-1191 PT Time Calculation (min): 27 min  PT Assessment / Plan / Recommendation Comments on Treatment Session  Performed side to side bed mobility with extra caution to L LE external fixature.  Assisted pt OOB via MAXI move + 2 assist then extra time for positioning to comfort.    Follow Up Recommendations  LTACH     Does the patient have the potential to tolerate intense rehabilitation     Barriers to Discharge        Equipment Recommendations  None recommended by PT    Recommendations for Other Services    Frequency Min 3X/week   Plan Discharge plan remains appropriate    Precautions / Restrictions Precautions Precautions: Fall Precaution Comments: External Fixator LLE Restrictions Weight Bearing Restrictions: Yes RLE Weight Bearing: Non weight bearing LLE Weight Bearing: Non weight bearing    Pertinent Vitals/Pain C/o R LE pain proximal to knee during gait.    Mobility  Bed Mobility Bed Mobility: Rolling Left;Rolling Right Rolling Right: 1: +2 Total assist Rolling Right: Patient Percentage: 20% Rolling Left: 1: +2 Total assist Rolling Left: Patient Percentage: 20% Details for Bed Mobility Assistance: increased time and caution to B LE positioning Transfers Transfer via Lift Equipment: Maximove Details for Transfer Assistance: + 2 assist with MAXI move 2nd NWB B LE.     PT Goals                                                               progressing    Visit Information  Last PT Received On: 09/15/12 Assistance Needed: +2    Subjective Data      Cognition       Balance     End of Session PT - End of Session Activity Tolerance: Patient tolerated treatment well Patient left: in chair;with call bell/phone within reach   Felecia Shelling  PTA [facility]  Acute  Rehab Pager     [phone]

## 2012-09-15 NOTE — Discharge Summary (Signed)
Physician Discharge Summary  Marquite Attwood AVW:[id number] DOB: February 02, 1927 DOA: 08/27/2012  PCP: Ernestine Conrad, MD  Admit date: 08/27/2012 Discharge date: 09/15/2012  Time spent: 55 minutes  Recommendations for Outpatient Follow-up:  1. Pt being discharged to [facility]. Follow up with Dr. Charlann Boxer in 2weeks.   Discharge Diagnoses:  Principal Problem:  *Multiple fractures of both lower extremities Active Problems:  DM  OBSTRUCTIVE SLEEP APNEA  GERD  PAF (paroxysmal atrial fibrillation), was in SR 06/2012   HTN (hypertension)  COPD (chronic obstructive pulmonary disease)  Hyponatremia  CKD (chronic kidney disease)  Pleural effusion  Hypercapnemia  Acute respiratory failure  Chronic diastolic heart failure  Hypokalemia  Alkalosis, metabolic  Pain  UTI (lower urinary tract infection)   Discharge Condition: medically stable and ready for [facility]  Diet recommendation: heart healthy low sodium  Filed Weights   09/13/12 0517 09/14/12 0657 09/15/12 0446  Weight: 99 kg (218 lb 4.1 oz) 100 kg (220 lb 7.4 oz) 99 kg (218 lb 4.1 oz)    History of present illness:  Jatara Huettner is a 78 y.o. female with past medical history of DM, HTN, afib, CKD III, OSA  Who presented to [facility] ED on 08/27/12 from [facility] due to  an unwitnessed fall around 400 am. Reportedly she had recently been at [facility] due to recent deconditioning. After the fall patient subsequently developed lower extremity discomfort and was subsequently taken to [facility] ED for further evaluation. History was obtained from daughters in the room and ED records. Patient was given dilaudid for BL LE discomfort and subsequently "is sleepy" per family. In the room initially ortho technician was at bedside placing braces and patient had discomfort with manipulation of lower extremities while placing gauze. Prior to arrival to ED patient was given Fentanyl 250 mcg intranasally, while in the ED received dilaudid 0.5 mg IV x  1 and zofran. ED x rays were obtained of lower extremities which showed: Oblique,displaced fracture of the proximal tibial metaphysis with extension to involve the tibial component of the left total knee replacement hardware.  Oblique, minimally displaced, slightly impacted fracture of the distal femoral metaphysis without definite extension to involve the femoral component of the right total knee replacement.  ED physician subsequently consulted Orthopaedic surgeon Dr. Victorino Dike; but since a Chest x ray done demonstrated showed: Interval development of a moderate-sized right-sided pleural effusion with persistent findings of mild pulmonary edema [facility] asked to admit  Hospital Course:  1-Acute respiratory failure due to OSA, hypercapnia and CHF:  -seen by Pccm on 10/28 who opined biggest issue balancing pain meds and optimizing fluid balance. Diuresed with lasix and then torsemide due to worsening renal function and placed on BIPAP. Initially improved and pt went to OR for repair of LE fx on 09/08/12 and came back to ICU on ventilator. Patient extubated on 09/11/12 and since then w/o significant complaints of SOB. Recommend continue IS and QHS Bipap . Sats 98% on 2L at discharge. Continue demadex. Volume status -1.3L for last 24hrs. Recommending continuing flovent and xopenex and continue chest toiletry .  2-Klebsiela and E.coli UTI (lower urinary tract infection):  -continue rocephin day #4/5 . Last dose 09/16/12 .  3-Multiple fractures of both lower extremities:  -S/p right ORIF and Left external fixation on 09/08/12. Improve pain management with Vicodin and robaxin. Follow ortho rec's  -PT on board   4-DM:  - HgA1C 7.0 on 09/04/12. Home lantus and home novolog meal coverage held during hospitalization.  SSI used for glycemic control. At discharge CBG range  140-189.  -At facility to continue SSI and to resume her lantus depending on CBG's.  5-OBSTRUCTIVE SLEEP APNEA:  -continue BIPAP QHS.  Pt tends to be resistant to BIPAP. At discharge she is able to wear for 4-5 hours at night. -Ok to use it as well during the day for long naps.    6-GERD:  -Continue PPI   7-PAF (paroxysmal atrial fibrillation):  -cardiology on board. Rate controlled at discharge. Coumadin per pharmacy. INR 2.01 on day of discharge. Will continue diltiazem and metoprolol (PO). -Follow up with Dr. Royann Shivers at discharge.  8-HTN (hypertension):  - fair control. Continue current regimen and low sodium diet.  -Further adjustment to be done as needed at the facility base on BP behavior   9-chronic resp failure secondary to COPD (chronic obstructive pulmonary disease):  - See problem #1. At discharge at baseline. continue inhalers. No wheezing.  -continue daily oxygen supplementation   10-Hyponatremia  - related to volume overload. Diuresed. Stable. At discharge sodium 137. continue diuresis   11-CKD (chronic kidney disease; stage III)  - Admitted with acute on chronic renal failure with creatinine of 2.4. Seen by nephrology on 08/30/12. Initially family resistant to using lasix. Pt did not diurese well. After discussion with nephrology family agreed to using lasix and high dose IV lasix started.  Pt deemed not a candidate for dialysis. Pt responded to plan. Lasix changed to torsemide. Will continue this at discharge.  At discharge creatinine 1.14.  Baseline creatinine 1.5-1.9.   12-Anemia  - Chronic disease component. Hgb 8.0 on discharge. no signs of overt bleeding. S/P 1 unit PRBC's 08/28/12. Consider  transfuse if Hgb less 7.0.   13-Hypokalemia  -Repleted and resolved.    Procedures: ORIF 09/08/12   Consultations:  Cardiology  Orthopedics  Nephrology  PCCM  Cardiology  Discharge Exam: Filed Vitals:   09/14/12 2100 09/15/12 0446 09/15/12 0839 09/15/12 1252  BP: 139/75 137/72  120/74  Pulse: 70 73  85  Temp: 97.7 F (36.5 C) 98.5 F (36.9 C)  98.8 F (37.1 C)  TempSrc: Oral Oral   Axillary  Resp:  18  18  Height:      Weight:  99 kg (218 lb 4.1 oz)    SpO2: 100% 99% 97% 100%    General: alert oriented Cardiovascular: irrregularly irregular No MGR PPP Respiratory: normal effort. BS slightly diminished. No wheezing/rhonchi  Discharge Instructions      Discharge Orders    Future Orders Please Complete By Expires   Diet - low sodium heart healthy      Call MD / Call 911      Comments:   If you experience chest pain or shortness of breath, CALL 911 and be transported to the hospital emergency room.  If you develope a fever above 101 F, pus (white drainage) or increased drainage or redness at the wound, or calf pain, call your surgeon's office.   Discharge instructions      Comments:   Right leg replace dressing daily with gauze and tape.  Left leg, place a small piece of Xeroform dressing around each pin, then cover each Xeroform with 4x4 guaze and then wrap the dressings in place.  Keep the area dry and clean until follow up.  Follow up in 2 weeks at [facility].  Call with any questions or concerns.   Constipation Prevention      Comments:   Drink plenty of fluids.  Prune juice may be helpful.  You may use a stool softener, such as Colace (over the counter) 100 mg twice a day.  Use MiraLax (over the counter) for constipation as needed.   Non weight bearing      Comments:   NWB bilateral legs   Increase activity slowly          Medication List     As of 09/15/2012  2:51 PM    STOP taking these medications         allopurinol 300 MG tablet   Commonly known as: ZYLOPRIM      hydrALAZINE 50 MG tablet   Commonly known as: APRESOLINE      insulin glargine 100 UNIT/ML injection   Commonly known as: LANTUS      iron polysaccharides 150 MG capsule   Commonly known as: NIFEREX      isosorbide dinitrate 20 MG tablet   Commonly known as: ISORDIL      sotalol 80 MG tablet   Commonly known as: BETAPACE      TAKE these medications          bisacodyl 10 MG suppository   Commonly known as: DULCOLAX   Place 1 suppository (10 mg total) rectally daily as needed.      darbepoetin 100 MCG/0.5ML Soln   Commonly known as: ARANESP   Inject 0.5 mLs (100 mcg total) into the skin every Friday at 6 PM.      dextrose 5 % SOLN 50 mL with cefTRIAXone 1 G SOLR   Last dose 09/16/12      diltiazem 60 MG tablet   Commonly known as: CARDIZEM   Take 60 mg by mouth 4 (four) times daily.      docusate sodium 100 MG capsule   Commonly known as: COLACE   Take 100 mg by mouth 2 (two) times daily as needed. constipation      feeding supplement Liqd   Take 237 mLs by mouth 2 (two) times daily between meals.      fluticasone 110 MCG/ACT inhaler   Commonly known as: FLOVENT HFA   Inhale 1 puff into the lungs 2 (two) times daily.      gabapentin 300 MG capsule   Commonly known as: NEURONTIN   Take 300 mg by mouth 3 (three) times daily.      HYDROcodone-acetaminophen 5-325 MG per tablet   Commonly known as: NORCO/VICODIN   Take 2 tablets by mouth every 6 (six) hours as needed. pain      insulin aspart 100 UNIT/ML injection   Commonly known as: novoLOG   Inject 0-15 Units into the skin 3 (three) times daily with meals.      metoprolol 50 MG tablet   Commonly known as: LOPRESSOR   Take 1 tablet (50 mg total) by mouth 2 (two) times daily.      pantoprazole 40 MG tablet   Commonly known as: PROTONIX   Take 40 mg by mouth daily.      polyethylene glycol packet   Commonly known as: MIRALAX / GLYCOLAX   Take 17 g by mouth daily.      torsemide 10 MG tablet   Commonly known as: DEMADEX   Take 1 tablet (10 mg total) by mouth daily.      Warfarin - Pharmacist Dosing Inpatient Misc   Coumadin to be dosed by pharmacy at [facility]      warfarin 4 MG tablet   Commonly known as: COUMADIN  Take 1 tablet (4 mg total) by mouth one time only at 6 PM.        Follow-up Information    Follow up with Thurmon Fair, MD. (office will call  you)    Contact information:   [address] [place] [address] [phone]       Follow up with Shelda Pal, MD. Schedule an appointment as soon as possible for a visit in 2 weeks.   Contact information:   [address] [place] [address] [phone]           The results of significant diagnostics from this hospitalization (including imaging, microbiology, ancillary and laboratory) are listed below for reference.    Significant Diagnostic Studies: Dg Chest 1 View  08/27/2012  *RADIOLOGY REPORT*  Clinical Data: Post fall, history of atrial fibrillation  CHEST - 1 VIEW  Comparison: 07/26/2012; 07/25/2012; 07/18/2012  Findings:  Grossly unchanged enlarged cardiac silhouette and mediastinal contours.  Interval development of a moderate-sized right-sided pleural effusion.  Pulmonary vasculature remains indistinct with cephalization of flow.  No definite left-sided pleural effusion. No definite pneumothorax.  Calcification overlying the left lower neck may be within the left lobe of the thyroid.  No definite acute osseous abnormalities.  IMPRESSION: 1.  Interval development of a moderate-sized right-sided pleural effusion with persistent findings of mild pulmonary edema.  Further evaluation with a PA and lateral chest radiograph may be obtained as clinically indicated. 2.  Possible calcification within the left lobe of the thyroid. Further evaluation with thyroid ultrasound may be performed in the non emergent setting as clinically indicated   Original Report Authenticated By: Waynard Reeds, M.D.    Dg Femur Right  08/27/2012  *RADIOLOGY REPORT*  Clinical Data: Post fall, now with severe pain and tenderness involving the distal aspect of the femur  RIGHT FEMUR - 2 VIEW  Comparison: None.  Findings:  There is an oblique, minimally displaced, slightly impacted fracture of the distal femoral metaphysis.  There is no definitive extension of the fracture into  the right total knee replacement hardware.  No definite evidence of hardware failure or loosening. No definite suprapatellar joint effusion, though evaluation degraded due to technique.  No radiopaque foreign body.  IMPRESSION: Oblique, minimally displaced, slightly impacted fracture of the distal femoral metaphysis without definite extension to involve the femoral component of the right total knee replacement.   Original Report Authenticated By: Waynard Reeds, M.D.    Dg Knee 1-2 Views Left  09/08/2012  *RADIOLOGY REPORT*  Clinical Data: External fixation of proximal left tibia fracture. Prior left knee arthroplasty.  OPERATIVE LEFT KNEE - 1-2 VIEW  Comparison: Left tibia-fibula x-rays 08/27/2012.  Findings: 2 spot images from the C-arm fluoroscopic device, AP and lateral views of the left knee and proximal tib-fib, were submitted for interpretation postoperatively.  These demonstrate an external fixator device which has been placed distal to the tibial fracture.  IMPRESSION: External fixator device placed in the proximal left tibia, distal to the tibial fracture.   Original Report Authenticated By: Hulan Saas, M.D.    Dg Knee 1-2 Views Right  09/08/2012  *RADIOLOGY REPORT*  Clinical Data: ORIF distal right femur fracture.  Prior right knee arthroplasty.  OPERATIVE RIGHT KNEE - 1-2 VIEW  Comparison: Right femur x-rays 08/27/2012.  Findings: 4 spot images from the C-arm fluoroscopic device, AP and lateral views of the distal right femur and knee, were submitted for interpretation post-operatively.  These demonstrate ORIF of  the comminuted distal right femur fracture with plate and screw fixation.  Alignment appears near anatomic.  Prior right knee arthroplasty with anatomic alignment.  IMPRESSION: ORIF of the comminuted distal right femur fracture with plate and screw fixation.   Original Report Authenticated By: Hulan Saas, M.D.    Dg Tibia/fibula Left  08/27/2012  *RADIOLOGY REPORT*  Clinical  Data: Post fall, now with leg pain  LEFT TIBIA AND FIBULA - 2 VIEW  Comparison: None.  Findings: There is an oblique, displaced fracture of the proximal tibial metaphysis with apparent extension to involve the tibial component of the left total knee hardware, which appears minimally angulated, apex ventral.  This is the adjacent soft tissue swelling.  No radiopaque foreign body.  Vascular calcifications.  IMPRESSION: Oblique, displaced fracture of the proximal tibial metaphysis with extension to involve the tibial component of the left total knee replacement hardware.   Original Report Authenticated By: Waynard Reeds, M.D.    Ct Head Wo Contrast  08/23/2012  *RADIOLOGY REPORT*  Clinical Data: Tremors and shaking  CT HEAD WITHOUT CONTRAST  Technique:  Contiguous axial images were obtained from the base of the skull through the vertex without contrast.  Comparison: 06/27/2012  Findings: Stable for mild age white matter hypodensity. There is prominence of the sulci and ventricles consistent with brain atrophy.  There is no evidence for acute brain infarct, hemorrhage or mass.  The paranasal sinuses and mastoid air cells are clear.  The skull appears intact.  IMPRESSION:  1.  No acute intracranial abnormalities.   Original Report Authenticated By: Rosealee Albee, M.D.    US Renal Port  08/30/2012  *RADIOLOGY REPORT*  Clinical Data: Acute renal failure  RENAL/URINARY TRACT ULTRASOUND COMPLETE  Comparison: CT 05/22/2012  Findings:  Right Kidney = 10.3 cm.  No hydronephrosis or mass.  A small simple cyst in upper pole.  Left kidney = Not well visualized. Bladder:  Foley catheter placed in the  IMPRESSION:  Normal right kidney.  Left kidney is poorly assessed.   Original Report Authenticated By: Genevive Bi, M.D.    Dg Chest Port 1 View  09/11/2012  *RADIOLOGY REPORT*  Clinical Data: Respiratory failure  PORTABLE CHEST - 1 VIEW  Comparison: 09/09/2012  Findings: Cardiomegaly with central vascular  congestion.  Interval extubation.  NG tube descends below the level of the image with the side port projecting just below the GE junction.  Bilateral infrahilar and bibasilar opacities and layering pleural effusions. No pneumothorax.  No acute osseous finding.  Multilevel degenerative change.  IMPRESSION: Interval extubation.  Edema pattern, bibasilar opacities and pleural effusions, similar to prior.   Original Report Authenticated By: Jearld Lesch, M.D.    Dg Chest Port 1 View  09/09/2012  *RADIOLOGY REPORT*  Clinical Data: Evaluate endotracheal tube  PORTABLE CHEST - 1 VIEW  Comparison: 09/08/2012  Findings: Endotracheal tube tip 3.2 cm proximal to the carina. Cardiomegaly.  Aortic atherosclerosis.  Bilateral infrahilar/lung base opacities and layering pleural effusions.  No pneumothorax. No interval osseous change.  IMPRESSION: Endotracheal tube tip 3.2 cm proximal to the carina.  Cardiomegaly and bilateral airspace opacities are similar to prior, favor edema.  Bilateral pleural effusions with associated airspace opacities are similar to prior, favor atelectasis.   Original Report Authenticated By: Jearld Lesch, M.D.    Dg Chest Port 1 View  09/09/2012  *RADIOLOGY REPORT*  Clinical Data: Intubation.  Postop ORIF right distal femur fracture.  PORTABLE CHEST - 1 VIEW 09/08/2012 20  3:50 5 hours:  Comparison: Portable chest x-ray earlier same date 0446 hours.  Findings: Intubation with the endotracheal tube tip in satisfactory position approximately 4 cm above the carina.  Cardiac silhouette enlarged but stable.  Diffuse interstitial and airspace pulmonary edema improved since earlier in the day, though mild edema persists.  Small bilateral pleural effusions, unchanged.  No confluent airspace consolidation.  IMPRESSION:  1.  Endotracheal tube tip in satisfactory position approximately 4 cm above the carina. 2.  Improved CHF since earlier in the day, though mild interstitial pulmonary edema persists. 3.   Stable bilateral pleural effusions.   Original Report Authenticated By: Hulan Saas, M.D.    Dg Chest Port 1 View  09/08/2012  *RADIOLOGY REPORT*  Clinical Data: Infiltrates,effusions  PORTABLE CHEST - 1 VIEW  Comparison: 09/01/2012  Findings: The patient is rotated to the left on today's exam, resulting in reduced diagnostic sensitivity and specificity. Cardiomegaly is present with continued vascular indistinctness, interstitial opacity, and patchy airspace opacities.  Underlying pleural effusions may contribute to the basilar confluence of opacity on this semi erect examination.  Low lung volumes are present, causing crowding of the pulmonary vasculature.   Right PICC line tip:  SVC.  IMPRESSION:  1.  Stable appearance of the chest.   Original Report Authenticated By: Gaylyn Rong, M.D.    Dg Chest Port 1 View  09/01/2012  *RADIOLOGY REPORT*  Clinical Data: Pleural effusions.  PORTABLE CHEST - 1 VIEW  Comparison: Plain film chest 08/28/2012 and 08/30/2012.  Findings: Right worse than left pleural effusions and airspace disease do not appear changed compared to the 08/28/2012 study. There is cardiomegaly.  Right PICC remains in place.  IMPRESSION: No marked change in right worse than left effusions and airspace disease compared to the 08/28/2012 study.   Original Report Authenticated By: Bernadene Bell. Maricela Curet, M.D.    Dg Chest Port 1 View  08/30/2012  *RADIOLOGY REPORT*  Clinical Data: Fall pleural effusions  PORTABLE CHEST - 1 VIEW  Comparison: Chest radiograph 08/28/2012  Findings: Stable enlarged heart silhouette.  There is a right PICC line in place.  There is generalized central venous congestion and low lung volumes with small pleural effusions. Effusions are improved.  No pneumothorax.  IMPRESSION:  1.  Low lung volumes and pleural effusions with central venous congestion suggest congestive heart failure.  2.  Effusions are slightly improved.   Original Report Authenticated By: Genevive Bi, M.D.    Dg Chest Port 1 View  08/28/2012  *RADIOLOGY REPORT*  Clinical Data: Confirm the line placement.  Status post PICC placement.  PORTABLE CHEST - 1 VIEW  Comparison: 08/27/2012  Findings: The patient is slightly rotated to the left.  Right upper extremity PICC projects over the distal superior vena cava.  Its tip is approximately 3 cm inferior to the carina.  Cardiomegaly, pulmonary vascular congestion, and pulmonary edema persist.  There are bilateral pleural effusions, right greater than left.  No evidence of pneumothorax.  Coarse, chunky calcification in the left neck is unchanged compared to cervical spine radiographs of 2002.  Question if this could be a calcification within the left thyroid lobe.  IMPRESSION:  1. The distal tip of the right upper extremity PICC projects over the distal superior vena cava, in satisfactory position. 2.  Congestive heart failure pattern with bilateral pleural effusions, right greater than left.   Original Report Authenticated By: Britta Mccreedy, M.D.    Dg C-arm 1-60 Min-no Report  09/08/2012  CLINICAL DATA: surgery  C-ARM 1-60 MINUTES  Fluoroscopy was utilized by the requesting physician.  No radiographic  interpretation.     Dg C-arm 61-120 Min-no Report  09/08/2012  CLINICAL DATA: periprosthetic fracture right hip   C-ARM 61-120 MINUTES  Fluoroscopy was utilized by the requesting physician.  No radiographic  interpretation.      Microbiology: Recent Results (from the past 240 hour(s))  CULTURE, BLOOD (ROUTINE X 2)     Status: Normal   Collection Time   09/09/12 12:45 PM      Component Value Range Status Comment   Specimen Description BLOOD LEFT HAND   Final    Special Requests BOTTLES DRAWN AEROBIC ONLY 2CC   Final    Culture  Setup Time 09/09/2012 23:51   Final    Culture NO GROWTH 5 DAYS   Final    Report Status 09/15/2012 FINAL   Final   CULTURE, BLOOD (ROUTINE X 2)     Status: Normal   Collection Time   09/09/12 12:55 PM       Component Value Range Status Comment   Specimen Description BLOOD LEFT ARM   Final    Special Requests BOTTLES DRAWN AEROBIC AND ANAEROBIC [facility]   Final    Culture  Setup Time 09/09/2012 23:50   Final    Culture NO GROWTH 5 DAYS   Final    Report Status 09/15/2012 FINAL   Final      Labs: Basic Metabolic Panel:  Lab 09/15/12 1610 09/14/12 0305 09/13/12 0340 09/12/12 0456 09/11/12 0157 09/10/12 0501 09/10/12 0500 09/09/12 1300 09/09/12 0500 09/09/12 0034  NA 137 136 137 139 143 -- -- -- -- --  K 3.8 3.5 3.8 2.9* 2.8* -- -- -- -- --  CL 99 99 101 100 101 -- -- -- -- --  CO2 30 28 27  32 33* -- -- -- -- --  GLUCOSE 126* 196* 175* 128* 184* -- -- -- -- --  BUN 24* 27* 38* 41* 51* -- -- -- -- --  CREATININE 1.14* 0.97 1.07 1.06 1.29* -- -- -- -- --  CALCIUM 8.9 8.8 9.0 9.1 9.2 -- -- -- -- --  MG -- -- -- -- 2.2 2.4 -- 2.4 -- 2.6*  PHOS -- -- -- 2.7 2.9 -- 2.7 -- 2.8 --   Liver Function Tests:  Lab 09/12/12 0456 09/11/12 0157 09/10/12 0500 09/09/12 0500 09/09/12 0034  AST 17 -- -- -- 22  ALT <5 -- -- -- 8  ALKPHOS 101 -- -- -- 134*  BILITOT 0.8 -- -- -- 1.3*  PROT 6.6 -- -- -- 7.1  ALBUMIN 2.9*2.9* 2.7* 2.7* 3.2* 3.3*   CBC:  Lab 09/15/12 0415 09/14/12 0905 09/13/12 0340 09/12/12 0456 09/11/12 0157  WBC 10.3 9.6 9.0 10.6* 9.2  NEUTROABS -- -- -- -- --  HGB 8.0* 8.2* 7.8* 7.8* 7.7*  HCT 25.4* 25.7* 25.0* 25.2* 24.8*  MCV 85.8 84.5 87.1 87.8 87.3  PLT 245 268 246 246 254   Cardiac Enzymes:  Lab 09/09/12 0034  CKTOTAL 62  CKMB 1.9  CKMBINDEX --  TROPONINI <0.30   CBG:  Lab 09/15/12 1207 09/15/12 0710 09/14/12 2235 09/14/12 1723 09/14/12 1135  GLUCAP 189* 140* 161* 191* 178*       Signed: Vassie Loll MD [facility] 09/15/2012, 2:51 PM

## 2012-11-21 ENCOUNTER — Emergency Department (HOSPITAL_COMMUNITY): Payer: Medicare Other

## 2012-11-21 ENCOUNTER — Emergency Department (HOSPITAL_COMMUNITY)
Admission: EM | Admit: 2012-11-21 | Discharge: 2012-11-22 | Disposition: A | Discharge status: Home Health | Payer: Medicare Other | Attending: Emergency Medicine | Admitting: Emergency Medicine

## 2012-11-21 ENCOUNTER — Encounter (HOSPITAL_COMMUNITY): Payer: Self-pay | Admitting: Physical Medicine and Rehabilitation

## 2012-11-21 DIAGNOSIS — G4733 Obstructive sleep apnea (adult) (pediatric): Secondary | ICD-10-CM | POA: Insufficient documentation

## 2012-11-21 DIAGNOSIS — Y9210 Unspecified residential institution as the place of occurrence of the external cause: Secondary | ICD-10-CM | POA: Insufficient documentation

## 2012-11-21 DIAGNOSIS — Z794 Long term (current) use of insulin: Secondary | ICD-10-CM | POA: Insufficient documentation

## 2012-11-21 DIAGNOSIS — Y838 Other surgical procedures as the cause of abnormal reaction of the patient, or of later complication, without mention of misadventure at the time of the procedure: Secondary | ICD-10-CM | POA: Insufficient documentation

## 2012-11-21 DIAGNOSIS — Z79899 Other long term (current) drug therapy: Secondary | ICD-10-CM | POA: Insufficient documentation

## 2012-11-21 DIAGNOSIS — Z87828 Personal history of other (healed) physical injury and trauma: Secondary | ICD-10-CM | POA: Insufficient documentation

## 2012-11-21 DIAGNOSIS — Y939 Activity, unspecified: Secondary | ICD-10-CM | POA: Insufficient documentation

## 2012-11-21 DIAGNOSIS — M549 Dorsalgia, unspecified: Secondary | ICD-10-CM | POA: Insufficient documentation

## 2012-11-21 DIAGNOSIS — G8929 Other chronic pain: Secondary | ICD-10-CM | POA: Insufficient documentation

## 2012-11-21 DIAGNOSIS — E119 Type 2 diabetes mellitus without complications: Secondary | ICD-10-CM | POA: Insufficient documentation

## 2012-11-21 DIAGNOSIS — I129 Hypertensive chronic kidney disease with stage 1 through stage 4 chronic kidney disease, or unspecified chronic kidney disease: Secondary | ICD-10-CM | POA: Insufficient documentation

## 2012-11-21 DIAGNOSIS — N179 Acute kidney failure, unspecified: Secondary | ICD-10-CM | POA: Insufficient documentation

## 2012-11-21 DIAGNOSIS — Z9181 History of falling: Secondary | ICD-10-CM | POA: Insufficient documentation

## 2012-11-21 DIAGNOSIS — F3289 Other specified depressive episodes: Secondary | ICD-10-CM | POA: Insufficient documentation

## 2012-11-21 DIAGNOSIS — X58XXXA Exposure to other specified factors, initial encounter: Secondary | ICD-10-CM | POA: Insufficient documentation

## 2012-11-21 DIAGNOSIS — F329 Major depressive disorder, single episode, unspecified: Secondary | ICD-10-CM | POA: Insufficient documentation

## 2012-11-21 DIAGNOSIS — Z8719 Personal history of other diseases of the digestive system: Secondary | ICD-10-CM | POA: Insufficient documentation

## 2012-11-21 DIAGNOSIS — Z9981 Dependence on supplemental oxygen: Secondary | ICD-10-CM | POA: Insufficient documentation

## 2012-11-21 DIAGNOSIS — I5032 Chronic diastolic (congestive) heart failure: Secondary | ICD-10-CM | POA: Insufficient documentation

## 2012-11-21 DIAGNOSIS — I4891 Unspecified atrial fibrillation: Secondary | ICD-10-CM | POA: Insufficient documentation

## 2012-11-21 DIAGNOSIS — N183 Chronic kidney disease, stage 3 unspecified: Secondary | ICD-10-CM | POA: Insufficient documentation

## 2012-11-21 DIAGNOSIS — T847XXA Infection and inflammatory reaction due to other internal orthopedic prosthetic devices, implants and grafts, initial encounter: Secondary | ICD-10-CM | POA: Insufficient documentation

## 2012-11-21 DIAGNOSIS — Z7901 Long term (current) use of anticoagulants: Secondary | ICD-10-CM | POA: Insufficient documentation

## 2012-11-21 LAB — BASIC METABOLIC PANEL
BUN: 47 mg/dL — ABNORMAL HIGH (ref 6–23)
GFR calc non Af Amer: 25 mL/min — ABNORMAL LOW (ref >90)
Glucose, Bld: 171 mg/dL — ABNORMAL HIGH (ref 70–99)
Potassium: 5.2 mEq/L — ABNORMAL HIGH (ref 3.5–5.1)

## 2012-11-21 LAB — CBC WITH DIFFERENTIAL/PLATELET
Eosinophils Absolute: 0.2 10*3/uL (ref 0.0–0.7)
Eosinophils Relative: 2 % (ref 0–5)
Hemoglobin: 11.4 g/dL — ABNORMAL LOW (ref 12.0–15.0)
Lymphs Abs: 1.1 10*3/uL (ref 0.7–4.0)
MCH: 27.7 pg (ref 26.0–34.0)
MCV: 86.6 fL (ref 78.0–100.0)
Monocytes Relative: 9 % (ref 3–12)
Neutrophils Relative %: 75 % (ref 43–77)
RBC: 4.11 MIL/uL (ref 3.87–5.11)

## 2012-11-21 MED ORDER — SODIUM CHLORIDE 0.9 % IJ SOLN: 10.0000 mL | INTRAMUSCULAR | Status: DC | PRN

## 2012-11-21 MED ORDER — OXYCODONE HCL 5 MG PO TABS: 5.0000 mg | ORAL_TABLET | ORAL | Status: DC | PRN

## 2012-11-21 MED ORDER — HYDROMORPHONE HCL PF 1 MG/ML IJ SOLN
0.5000 mg | Freq: Once | INTRAMUSCULAR | Status: AC
  Administered 2012-11-21: via INTRAVENOUS

## 2012-11-21 MED ORDER — DOXYCYCLINE HYCLATE 100 MG PO CAPS: 100.0000 mg | ORAL_CAPSULE | Freq: Two times a day (BID) | ORAL | Status: DC

## 2012-11-21 MED ORDER — OXYCODONE-ACETAMINOPHEN 5-325 MG PO TABS: 1.0000 | ORAL_TABLET | ORAL | Status: DC | PRN

## 2012-11-21 MED ORDER — SODIUM CHLORIDE 0.9 % IV BOLUS (SEPSIS)
1000.0000 mL | Freq: Once | INTRAVENOUS | Status: AC
  Administered 2012-11-21: 1000 mL via INTRAVENOUS

## 2012-11-21 MED ORDER — HYDROMORPHONE HCL PF 1 MG/ML IJ SOLN
0.5000 mg | Freq: Once | INTRAMUSCULAR | Status: AC
  Administered 2012-11-21: 0.5 mg via INTRAVENOUS
  Filled 2012-11-21: qty 1

## 2012-11-21 NOTE — ED Notes (Signed)
Spoke with IV team Re: accessing power PICC line. IV team requests chest x-ray to confirm placement. PA made aware, new order for chest xray noted.

## 2012-11-21 NOTE — ED Notes (Signed)
Report given to nurse at [facility].  Pt talking with family, states she is feeling better

## 2012-11-21 NOTE — ED Notes (Signed)
Pt repositioned to rightnside for comnfort

## 2012-11-21 NOTE — ED Notes (Signed)
Caregiver at bedside given [place] sandwich and diet sida

## 2012-11-21 NOTE — ED Notes (Signed)
Pt cleaned and linens changed per pt request.  bilat arms and leg propped on pillows per pt request.  Pt states leg is feeling better since ice packs applied

## 2012-11-21 NOTE — ED Notes (Signed)
Pt given snack of graham crackers, peanut butter and apple juice.  Awaiting IV fluid infusion and will be d/c'd

## 2012-11-21 NOTE — ED Notes (Signed)
Lab in to draw labs.

## 2012-11-21 NOTE — ED Notes (Signed)
Pt presents to department for evaluation of L leg pain and swelling. Pt states she had fracture repair surgery several weeks ago, external fixation device placed. Now states increased pain and swelling. Upon arrival leg noted to be warm and swollen. Bloody yellow drainage noted around device. 10/10 pain at the time. Pedal pulses present. Able to wiggle digits. Pt has PICC line and foley catheter from [facility]. She is alert and oriented x4.

## 2012-11-21 NOTE — ED Notes (Signed)
2 ice packs applied to left knee and lower leg per pt request. She state that this helps relief her pain.

## 2012-11-21 NOTE — ED Notes (Signed)
IV called to access power PICC.

## 2012-11-21 NOTE — ED Notes (Signed)
Pt presents to department via [facility] from [facility]. Pt c/o L leg pain. Has external fixation device in place, yellow/bloody drainage noted at site of device. Leg noted to be swollen and warm to touch. CBG 515. Pt is alert and oriented x4. Has foley catheter and porta cath in place from facility.

## 2012-11-21 NOTE — ED Provider Notes (Signed)
History     CSN: [id number]  Arrival date & time 11/21/12  1756   First MD Initiated Contact with Patient 11/21/12 1800      Chief Complaint  Patient presents with  . Leg Pain    (Consider location/radiation/quality/duration/timing/severity/associated sxs/prior treatment) Patient is a 79 y.o. female presenting with leg pain. The history is provided by the patient.  Leg Pain  The incident occurred more than 1 week ago. The incident occurred at a nursing home. There was no injury mechanism. The pain is present in the left leg. The quality of the pain is described as aching. The pain is moderate. The pain has been constant since onset. Pertinent negatives include no loss of motion, no loss of sensation and no tingling. She reports no foreign bodies present. The symptoms are aggravated by activity, bearing weight and palpation. Treatments tried: oxycodone. The treatment provided no relief.    Past Medical History  Diagnosis Date  . Diabetes mellitus     x 15 yrs  . GERD (gastroesophageal reflux disease)   . Back pain, chronic   . Pain in shoulder   . Pain in ankle   . Hiatal hernia   . HTN (hypertension)     all her life  . CKD (chronic kidney disease), stage III     GFR: 38  . Atrial fibrillation     since 1996  . Sleep apnea   . Fall 3 weeks ago    was evaluated at [facility]  . OSA on CPAP   . Depression   . PONV (postoperative nausea and vomiting)     difficult to wake up  . Chronic diastolic heart failure 08/31/2012    Past Surgical History  Procedure Date  . Bunionectomy     bilateral  . Cholecystectomy   . Rotator cuff repair     2002  . Total knee arthroplasty     bilateral 2001  . Hammer toe surgery     2004  . Colonoscopy 07/11/2011    Procedure: COLONOSCOPY;  Surgeon: Malissa Hippo, MD;  Location: [facility];  Service: Endoscopy;  Laterality: N/A;  3:00  . Orif periprosthetic fracture 09/08/2012    Procedure: OPEN REDUCTION INTERNAL FIXATION (ORIF)  PERIPROSTHETIC FRACTURE;  Surgeon: Shelda Pal, MD;  Location: [facility];  Service: Orthopedics;  Laterality: Right;  ORIF periprosthetic right proximal femur fracture Spanning external fixator left distal tibia  . External fixation leg 09/08/2012    Procedure: EXTERNAL FIXATION LEG;  Surgeon: Shelda Pal, MD;  Location: [facility];  Service: Orthopedics;  Laterality: Left;    Family History  Problem Relation Age of Onset  . Colon cancer Brother     History  Substance Use Topics  . Smoking status: Never Smoker   . Smokeless tobacco: Never Used  . Alcohol Use: No    OB History    Grav Para Term Preterm Abortions TAB SAB Ect Mult Living                  Review of Systems  Constitutional: Negative for fever and fatigue.  HENT: Negative for congestion, rhinorrhea and postnasal drip.   Eyes: Negative for photophobia and visual disturbance.  Respiratory: Negative for chest tightness, shortness of breath and wheezing.   Cardiovascular: Negative for chest pain, palpitations and leg swelling.  Gastrointestinal: Negative for nausea, vomiting, abdominal pain and diarrhea.  Genitourinary: Negative for urgency, frequency and difficulty urinating.  Musculoskeletal: Negative for back pain and  arthralgias.       Left leg pain and swelling  Skin: Negative for rash and wound.  Neurological: Negative for tingling, weakness and headaches.  Psychiatric/Behavioral: Negative for confusion and agitation.    Allergies  Morphine sulfate; Remeron; Tuna; and Penicillins  Home Medications   Current Outpatient Rx  Name  Route  Sig  Dispense  Refill  . ACETAZOLAMIDE 250 MG PO TABS   Oral   Take 250 mg by mouth 2 (two) times daily.         . ALLOPURINOL 100 MG PO TABS   Oral   Take 100 mg by mouth daily.         . AMIODARONE HCL 200 MG PO TABS   Oral   Take 200 mg by mouth daily.         Marland Kitchen BISACODYL 10 MG RE SUPP   Rectal   Place 1 suppository (10 mg total) rectally daily as needed.    12 suppository      . CARVEDILOL 25 MG PO TABS   Oral   Take 25 mg by mouth 2 (two) times daily with a meal.         . DARBEPOETIN ALFA-POLYSORBATE 150 MCG/0.3ML IJ SOLN   Subcutaneous   Inject 125 mcg into the skin every 7 (seven) days.         . DOCUSATE SODIUM 100 MG PO CAPS   Oral   Take 100 mg by mouth 2 (two) times daily.         . GUAIFENESIN ER 600 MG PO TB12   Oral   Take 1,200 mg by mouth 2 (two) times daily.         Marland Kitchen HYDRALAZINE HCL 25 MG PO TABS   Oral   Take 25 mg by mouth 3 (three) times daily.         Marland Kitchen HYDRALAZINE HCL IJ   Injection   Inject 10 mg as directed every 4 (four) hours as needed.         . INSULIN ASPART 100 UNIT/ML [place] SOLN   Subcutaneous   Inject 0-15 Units into the skin 3 (three) times daily with meals.   1 vial   0   . INSULIN GLARGINE 100 UNIT/ML [place] SOLN   Subcutaneous   Inject 5 Units into the skin at bedtime.         . ISOSORBIDE DINITRATE 20 MG PO TABS   Oral   Take 20 mg by mouth 3 (three) times daily.         Marland Kitchen METOLAZONE 5 MG PO TABS   Oral   Take 5 mg by mouth daily.         . OXYCODONE HCL 5 MG PO TABS   Oral   Take 10 mg by mouth every 4 (four) hours as needed. For pain         . OXYCODONE HCL 5 MG PO TABS   Oral   Take 10 mg by mouth every 4 (four) hours as needed. For pain         . PANTOPRAZOLE SODIUM 40 MG PO TBEC   Oral   Take 40 mg by mouth daily.         Marland Kitchen POLYETHYLENE GLYCOL 3350 PO PACK   Oral   Take 17 g by mouth daily.         Marland Kitchen SEVELAMER CARBONATE 800 MG PO TABS   Oral   Take 800 mg by mouth 3 (  three) times daily with meals.         . TORSEMIDE 20 MG PO TABS   Oral   Take 20 mg by mouth daily.         . TRAZODONE HCL 50 MG PO TABS   Oral   Take 50 mg by mouth at bedtime.         Marland Kitchen VITAMIN D (ERGOCALCIFEROL) 50000 UNITS PO CAPS   Oral   Take 50,000 Units by mouth every 7 (seven) days.         Marland Kitchen VITAMIN K 100 MCG PO TABS   Oral   Take 100 mcg by mouth  daily.         . WARFARIN SODIUM 2 MG PO TABS   Oral   Take 2 mg by mouth daily.         Marland Kitchen DOXYCYCLINE HYCLATE 100 MG PO CAPS   Oral   Take 1 capsule (100 mg total) by mouth 2 (two) times daily.   20 capsule   0   . OXYCODONE-ACETAMINOPHEN 5-325 MG PO TABS   Oral   Take 1 tablet by mouth every 4 (four) hours as needed for pain (continue taking oxycodone. Take this for breakthrough pain.).   30 tablet   0     BP 135/68  Pulse 90  Temp 98.1 F (36.7 C) (Oral)  Resp 18  SpO2 96%  Physical Exam  Nursing note and vitals reviewed. Constitutional: She is oriented to person, place, and time. She appears well-developed and well-nourished. No distress.  HENT:  Head: Normocephalic and atraumatic.  Mouth/Throat: Oropharynx is clear and moist.  Eyes: EOM are normal. Pupils are equal, round, and reactive to light.  Neck: Normal range of motion. Neck supple.  Cardiovascular: Normal rate, regular rhythm, normal heart sounds and intact distal pulses.   Pulmonary/Chest: Effort normal and breath sounds normal. She has no wheezes. She has no rales.  Abdominal: Soft. Bowel sounds are normal. She exhibits no distension. There is no tenderness. There is no rebound and no guarding.  Musculoskeletal: Normal range of motion. She exhibits no edema and no tenderness.       Left leg swelling, warm, and tenderness. Ex-fix in place. Some purulent drainage from ex-fix pin sites.  Lymphadenopathy:    She has no cervical adenopathy.  Neurological: She is alert and oriented to person, place, and time. She displays normal reflexes. No cranial nerve deficit. She exhibits normal muscle tone. Coordination normal.  Skin: Skin is warm and dry. No rash noted.  Psychiatric: She has a normal mood and affect. Her behavior is normal.    ED Course  Procedures (including critical care time)  Labs Reviewed  CBC WITH DIFFERENTIAL - Abnormal; Notable for the following:    Hemoglobin 11.4 (*)     HCT 35.6 (*)      RDW 17.9 (*)     All other components within normal limits  BASIC METABOLIC PANEL - Abnormal; Notable for the following:    Sodium 124 (*)     Potassium 5.2 (*)     Chloride 84 (*)     Glucose, Bld 171 (*)     BUN 47 (*)     Creatinine, Ser 1.80 (*)     GFR calc non Af Amer 25 (*)     GFR calc Af Amer 29 (*)     All other components within normal limits  SEDIMENTATION RATE  C-REACTIVE PROTEIN   Dg Chest 1 View  11/21/2012  *RADIOLOGY REPORT*  Clinical Data: Shortness of breath - PICC line placement.  CHEST - 1 VIEW  Comparison: 10/29/2012 chest radiograph  Findings: Cardiomegaly, pulmonary vascular congestion and probable interstitial pulmonary edema noted in this low volume film. Mild bibasilar atelectasis is present. A left IJ central venous catheters present with tips overlying the upper right atrium. A right PICC line is present with tip difficult to identified but appears to overlie the upper SVC. There is no evidence of pneumothorax.  IMPRESSION: Right PICC line tip difficult to visualize but appears to overlie the upper SVC.  Cardiomegaly with pulmonary vascular congestion and question interstitial pulmonary edema.  Mild basilar atelectasis.   Original Report Authenticated By: Harmon Pier, M.D.    Dg Tibia/fibula Left  11/21/2012  *RADIOLOGY REPORT*  Clinical Data: Left leg pain with new right pain and peri and drainage.  History of left knee replacement.  External fixator in place.  LEFT TIBIA AND FIBULA - 2 VIEW  Comparison: 08/27/2012 and 09/08/2012 studies.  Findings: Left total knee replacement is identified. An oblique fracture extending from the lateral tibial plateau inferiorly across the base of the tibial component, now with 7 mm lateral displacement. 1 cm posterior displacement of the fracture does not appear significantly changed. Now visualized is a nondisplaced oblique fracture within the proximal tibial diaphysis distal to the above mentioned fracture is noted, traversing  the more proximal external fixator screw.  Diffuse soft tissue swelling is noted.  IMPRESSION: Increased lateral displacement of the oblique fracture of the proximal tibia extending along the base of the tibial hardware component, now measuring 7 mm.  Relatively unchanged posterior displacement.  Now visualized is a nondisplaced oblique fracture of the proximal tibial diaphysis traversing the more proximal external fixator screw.  Diffuse soft tissue swelling.   Original Report Authenticated By: Harmon Pier, M.D.      1. Infection of halo orthosis pin site   2. Acute renal failure       MDM  89F sent from nursing home for left leg pain. She has a left tib/fib ex-fix that was placed about 3 months ago. For the last week, she has had increased pain, swelling, and purulent drainage from pin sites. Percocet not helping. No fevers. Non-toxic appearing. Concern for infection. Will obtain labs, inflammatory markers, xray, and treat pain with Dilaudid. Plan for ortho consult.  Spoke with ortho over the phone and feel presentation is c/w pin-site infection. However, pt is not septic and no concern for deeper soft tissue infection at this time. Creatinine up slightly from baseline at 1.8. Potassium 5.2. Sodium 124. Will give 1L IVF and have her recheck creatinine in 2 days. Will d/c with Doxycycline and oxycodone for additional pain medicine (already on oxycodone 10 mg q4h). She is to f/u with ortho on Monday.        Johnnette Gourd, MD 11/22/12 [phone]

## 2012-11-22 NOTE — ED Notes (Signed)
Pt transported back to Nursing home via [facility]

## 2012-11-23 NOTE — ED Provider Notes (Signed)
Medical screening examination/treatment/procedure(s) were conducted as a shared visit with non-physician practitioner(s) and myself.  I personally evaluated the patient during the encounter.  Patient complaining of pain and drainage from around the pinhole for her external fixator device. There is some mild tenderness without any purulence. At the recommendation of orthopedics, discharge with antibiotic coverage, follow up in the office.  Gilda Crease, MD 11/23/12 [phone]

## 2012-11-27 ENCOUNTER — Encounter (HOSPITAL_COMMUNITY): Payer: Self-pay | Admitting: *Deleted

## 2012-11-27 ENCOUNTER — Encounter (HOSPITAL_COMMUNITY): Payer: Self-pay | Admitting: Pharmacy Technician

## 2012-11-27 NOTE — Progress Notes (Signed)
Requested information from [facility] and received.  Spoke with Chaya Jan and Roseanna at [facility] and questions answered regarding some medications.  Pt does have a PICC line, foley catheter and oxygen via nasal cannula.  [facility] to fax me BIPAP settings and how much oxygen.  Patient is alert and oriented per Dena at Nursing Home.  Daughter Gelene Mink- [phone] is aware per Dena that patient is having surgery.  Left message on phone of [phone] with time and date of surgery and arrival time. Patient had labs done 11/27/12 at [facility] and they were faxed and placed on chart.  [facility] ( per [facility]) stated they would send on surgery day medication list with last time medication administered, last 2 weeks of labs, vital signs, etc.

## 2012-11-27 NOTE — Progress Notes (Signed)
Faxed preop instructions to [facility] and called them .  Spoke with Roseanna the nurse and they have received preop instructions.

## 2012-11-29 NOTE — Progress Notes (Signed)
Spoke with Nedra Hai at [facility] , nurse who takes care of patient.  Asked him to make sure he did not have any questions regarding preop instructions since I did not hear back from them on Friday, 11/27/12.  Nedra Hai stated patient would be picked up by ambulance at 0800am on 11/30/12.  Nedra Hai asked regarding the surgical procedure and I read to him procedure based on what consent stated.  Nedra Hai asked regarding about other medications patient normally takes in am and I told him anesthesia likes for patient to take certain  blood pressure medications and cardiac medications and pain medications they are regularly scheduled.  He voiced understanding of the preop list that was faxed and stated that patient normally takes Amiodarone, Coreg Apresoline, Isordil and Protonix and pain medication in the am .  Nurse, Nedra Hai voiced understanding of preop instructions .  Also instructed Nedra Hai that I had left off the preop instructions for patient to come with mask and tubing for cpap.  Nedra Hai voiced understanding for patient to come with that.  Asked Nedra Hai how much oxygen patient was on.  He stated 2L/[place].  ( Dena from Friday did not let me know how much oxygen patient was on ) .

## 2012-11-30 ENCOUNTER — Encounter (HOSPITAL_COMMUNITY): Payer: Self-pay | Admitting: Anesthesiology

## 2012-11-30 ENCOUNTER — Encounter (HOSPITAL_COMMUNITY): Payer: Self-pay | Admitting: *Deleted

## 2012-11-30 ENCOUNTER — Inpatient Hospital Stay (HOSPITAL_COMMUNITY): Payer: Medicare Other | Admitting: Anesthesiology

## 2012-11-30 ENCOUNTER — Inpatient Hospital Stay (HOSPITAL_COMMUNITY)
Admission: RE | Admit: 2012-11-30 | Discharge: 2012-12-03 | DRG: 496 | Disposition: A | Discharge status: Skilled Nursing Facility | Payer: Medicare Other | Source: Ambulatory Visit | Attending: Orthopedic Surgery | Admitting: Orthopedic Surgery

## 2012-11-30 ENCOUNTER — Encounter (HOSPITAL_COMMUNITY)
Admission: RE | Disposition: A | Discharge status: Skilled Nursing Facility | Payer: Self-pay | Source: Ambulatory Visit | Attending: Orthopedic Surgery

## 2012-11-30 ENCOUNTER — Inpatient Hospital Stay (HOSPITAL_COMMUNITY): Payer: Medicare Other

## 2012-11-30 DIAGNOSIS — J4489 Other specified chronic obstructive pulmonary disease: Secondary | ICD-10-CM | POA: Diagnosis present

## 2012-11-30 DIAGNOSIS — J449 Chronic obstructive pulmonary disease, unspecified: Secondary | ICD-10-CM | POA: Diagnosis present

## 2012-11-30 DIAGNOSIS — D500 Iron deficiency anemia secondary to blood loss (chronic): Secondary | ICD-10-CM

## 2012-11-30 DIAGNOSIS — IMO0002 Reserved for concepts with insufficient information to code with codable children: Secondary | ICD-10-CM

## 2012-11-30 DIAGNOSIS — Z79899 Other long term (current) drug therapy: Secondary | ICD-10-CM

## 2012-11-30 DIAGNOSIS — E871 Hypo-osmolality and hyponatremia: Secondary | ICD-10-CM | POA: Diagnosis not present

## 2012-11-30 DIAGNOSIS — I509 Heart failure, unspecified: Secondary | ICD-10-CM | POA: Diagnosis present

## 2012-11-30 DIAGNOSIS — Z794 Long term (current) use of insulin: Secondary | ICD-10-CM

## 2012-11-30 DIAGNOSIS — I129 Hypertensive chronic kidney disease with stage 1 through stage 4 chronic kidney disease, or unspecified chronic kidney disease: Secondary | ICD-10-CM | POA: Diagnosis present

## 2012-11-30 DIAGNOSIS — I5032 Chronic diastolic (congestive) heart failure: Secondary | ICD-10-CM | POA: Diagnosis present

## 2012-11-30 DIAGNOSIS — Z96659 Presence of unspecified artificial knee joint: Secondary | ICD-10-CM

## 2012-11-30 DIAGNOSIS — G4733 Obstructive sleep apnea (adult) (pediatric): Secondary | ICD-10-CM | POA: Diagnosis present

## 2012-11-30 DIAGNOSIS — E119 Type 2 diabetes mellitus without complications: Secondary | ICD-10-CM | POA: Diagnosis present

## 2012-11-30 DIAGNOSIS — E669 Obesity, unspecified: Secondary | ICD-10-CM | POA: Diagnosis present

## 2012-11-30 DIAGNOSIS — Z6838 Body mass index (BMI) 38.0-38.9, adult: Secondary | ICD-10-CM

## 2012-11-30 DIAGNOSIS — K219 Gastro-esophageal reflux disease without esophagitis: Secondary | ICD-10-CM | POA: Diagnosis present

## 2012-11-30 DIAGNOSIS — D62 Acute posthemorrhagic anemia: Secondary | ICD-10-CM | POA: Diagnosis not present

## 2012-11-30 DIAGNOSIS — E876 Hypokalemia: Secondary | ICD-10-CM | POA: Diagnosis not present

## 2012-11-30 DIAGNOSIS — Z4789 Encounter for other orthopedic aftercare: Principal | ICD-10-CM

## 2012-11-30 DIAGNOSIS — N183 Chronic kidney disease, stage 3 unspecified: Secondary | ICD-10-CM | POA: Diagnosis present

## 2012-11-30 HISTORY — DX: Respiratory failure, unspecified, unspecified whether with hypoxia or hypercapnia: J96.90

## 2012-11-30 HISTORY — DX: Heart failure, unspecified: I50.9

## 2012-11-30 HISTORY — DX: Unspecified fracture of shaft of unspecified tibia, initial encounter for closed fracture: S82.209A

## 2012-11-30 HISTORY — PX: EXTERNAL FIXATION LEG: SHX1549

## 2012-11-30 HISTORY — DX: Urinary tract infection, site not specified: N39.0

## 2012-11-30 HISTORY — DX: Pleural effusion, not elsewhere classified: J90

## 2012-11-30 HISTORY — DX: Metabolic encephalopathy: G93.41

## 2012-11-30 HISTORY — DX: Chronic obstructive pulmonary disease, unspecified: J44.9

## 2012-11-30 LAB — CBC
MCHC: 31.1 g/dL (ref 30.0–36.0)
RDW: 17.2 % — ABNORMAL HIGH (ref 11.5–15.5)

## 2012-11-30 LAB — URINALYSIS, ROUTINE W REFLEX MICROSCOPIC
Bilirubin Urine: NEGATIVE
Hgb urine dipstick: NEGATIVE
Nitrite: NEGATIVE
Protein, ur: NEGATIVE mg/dL
Specific Gravity, Urine: 1.003 — ABNORMAL LOW (ref 1.005–1.030)
Urobilinogen, UA: 0.2 mg/dL (ref 0.0–1.0)

## 2012-11-30 LAB — PROTIME-INR
INR: 1.48 (ref 0.00–1.49)
Prothrombin Time: 17.5 seconds — ABNORMAL HIGH (ref 11.6–15.2)

## 2012-11-30 LAB — BASIC METABOLIC PANEL
BUN: 43 mg/dL — ABNORMAL HIGH (ref 6–23)
CO2: 33 mEq/L — ABNORMAL HIGH (ref 19–32)
Chloride: 89 mEq/L — ABNORMAL LOW (ref 96–112)
Creatinine, Ser: 1.7 mg/dL — ABNORMAL HIGH (ref 0.50–1.10)
GFR calc Af Amer: 31 mL/min — ABNORMAL LOW (ref >90)
Glucose, Bld: 118 mg/dL — ABNORMAL HIGH (ref 70–99)

## 2012-11-30 LAB — GLUCOSE, CAPILLARY: Glucose-Capillary: 117 mg/dL — ABNORMAL HIGH (ref 70–99)

## 2012-11-30 LAB — SURGICAL PCR SCREEN: MRSA, PCR: NEGATIVE

## 2012-11-30 SURGERY — EXTERNAL FIXATION, LOWER EXTREMITY
Anesthesia: General | Site: Knee | Laterality: Left | Wound class: Clean Contaminated

## 2012-11-30 MED ORDER — PROPOFOL 10 MG/ML IV BOLUS
INTRAVENOUS | Status: DC | PRN
  Administered 2012-11-30: 70 mg via INTRAVENOUS

## 2012-11-30 MED ORDER — ONDANSETRON HCL 4 MG/2ML IJ SOLN
4.0000 mg | Freq: Four times a day (QID) | INTRAMUSCULAR | Status: DC | PRN
  Administered 2012-12-02: 4 mg via INTRAVENOUS
  Filled 2012-11-30: qty 2

## 2012-11-30 MED ORDER — FERROUS SULFATE 325 (65 FE) MG PO TABS
325.0000 mg | ORAL_TABLET | Freq: Three times a day (TID) | ORAL | Status: DC
  Administered 2012-11-30 – 2012-12-03 (×8): 325 mg via ORAL
  Filled 2012-11-30 (×11): qty 1

## 2012-11-30 MED ORDER — OXYCODONE HCL 10 MG PO TB12: 10.0000 mg | ORAL_TABLET | Freq: Two times a day (BID) | ORAL | Status: DC

## 2012-11-30 MED ORDER — HYDROMORPHONE HCL PF 1 MG/ML IJ SOLN
0.5000 mg | INTRAMUSCULAR | Status: DC | PRN
  Administered 2012-11-30: 1 mg via INTRAVENOUS
  Administered 2012-11-30 (×2): 0.5 mg via INTRAVENOUS
  Filled 2012-11-30 (×3): qty 1

## 2012-11-30 MED ORDER — HYDRALAZINE HCL 25 MG PO TABS
25.0000 mg | ORAL_TABLET | Freq: Three times a day (TID) | ORAL | Status: DC
  Administered 2012-11-30 – 2012-12-03 (×9): 25 mg via ORAL
  Filled 2012-11-30 (×11): qty 1

## 2012-11-30 MED ORDER — SUCCINYLCHOLINE CHLORIDE 20 MG/ML IJ SOLN
INTRAMUSCULAR | Status: DC | PRN
  Administered 2012-11-30: 100 mg via INTRAVENOUS

## 2012-11-30 MED ORDER — MENTHOL 3 MG MT LOZG: 1.0000 | LOZENGE | OROMUCOSAL | Status: DC | PRN

## 2012-11-30 MED ORDER — NITROGLYCERIN 0.4 MG SL SUBL: 0.4000 mg | SUBLINGUAL_TABLET | SUBLINGUAL | Status: DC | PRN

## 2012-11-30 MED ORDER — ISOSORBIDE DINITRATE 20 MG PO TABS
20.0000 mg | ORAL_TABLET | Freq: Three times a day (TID) | ORAL | Status: DC
  Administered 2012-11-30 – 2012-12-03 (×9): 20 mg via ORAL
  Filled 2012-11-30 (×11): qty 1

## 2012-11-30 MED ORDER — CARVEDILOL 25 MG PO TABS
25.0000 mg | ORAL_TABLET | Freq: Two times a day (BID) | ORAL | Status: DC
  Administered 2012-11-30 – 2012-12-03 (×6): 25 mg via ORAL
  Filled 2012-11-30 (×8): qty 1

## 2012-11-30 MED ORDER — ALUM & MAG HYDROXIDE-SIMETH 200-200-20 MG/5ML PO SUSP: 30.0000 mL | ORAL | Status: DC | PRN

## 2012-11-30 MED ORDER — MEPERIDINE HCL 50 MG/ML IJ SOLN: 6.2500 mg | INTRAMUSCULAR | Status: DC | PRN

## 2012-11-30 MED ORDER — WARFARIN SODIUM 2.5 MG PO TABS
2.5000 mg | ORAL_TABLET | Freq: Once | ORAL | Status: AC
  Administered 2012-11-30: 2.5 mg via ORAL
  Filled 2012-11-30: qty 1

## 2012-11-30 MED ORDER — CELECOXIB 200 MG PO CAPS
200.0000 mg | ORAL_CAPSULE | Freq: Two times a day (BID) | ORAL | Status: DC
  Administered 2012-11-30 – 2012-12-03 (×6): 200 mg via ORAL
  Filled 2012-11-30 (×7): qty 1

## 2012-11-30 MED ORDER — MICONAZOLE NITRATE 2 % EX POWD: 1.0000 "application " | CUTANEOUS | Status: DC | PRN

## 2012-11-30 MED ORDER — POLYETHYLENE GLYCOL 3350 17 G PO PACK
17.0000 g | PACK | Freq: Two times a day (BID) | ORAL | Status: DC
  Administered 2012-11-30 – 2012-12-03 (×5): 17 g via ORAL

## 2012-11-30 MED ORDER — PANTOPRAZOLE SODIUM 40 MG PO TBEC
40.0000 mg | DELAYED_RELEASE_TABLET | Freq: Every day | ORAL | Status: DC
  Administered 2012-11-30 – 2012-12-03 (×4): 40 mg via ORAL
  Filled 2012-11-30 (×4): qty 1

## 2012-11-30 MED ORDER — DEXTROSE 5 % IV SOLN
500.0000 mg | Freq: Four times a day (QID) | INTRAVENOUS | Status: DC | PRN
  Administered 2012-11-30: 500 mg via INTRAVENOUS
  Filled 2012-11-30: qty 5

## 2012-11-30 MED ORDER — AMIODARONE HCL 200 MG PO TABS
200.0000 mg | ORAL_TABLET | Freq: Every day | ORAL | Status: DC
  Administered 2012-12-01 – 2012-12-03 (×3): 200 mg via ORAL
  Filled 2012-11-30 (×4): qty 1

## 2012-11-30 MED ORDER — CLINDAMYCIN PHOSPHATE 600 MG/50ML IV SOLN
600.0000 mg | Freq: Four times a day (QID) | INTRAVENOUS | Status: AC
  Administered 2012-11-30 – 2012-12-01 (×2): 600 mg via INTRAVENOUS
  Filled 2012-11-30 (×2): qty 50

## 2012-11-30 MED ORDER — INSULIN GLARGINE 100 UNIT/ML ~~LOC~~ SOLN
5.0000 [IU] | Freq: Every day | SUBCUTANEOUS | Status: DC
  Administered 2012-11-30 – 2012-12-02 (×3): 5 [IU] via SUBCUTANEOUS

## 2012-11-30 MED ORDER — PROMETHAZINE HCL 25 MG/ML IJ SOLN: 6.2500 mg | INTRAMUSCULAR | Status: DC | PRN

## 2012-11-30 MED ORDER — GUAIFENESIN ER 600 MG PO TB12
600.0000 mg | ORAL_TABLET | Freq: Two times a day (BID) | ORAL | Status: DC
  Administered 2012-11-30 – 2012-12-03 (×6): 600 mg via ORAL
  Filled 2012-11-30 (×7): qty 1

## 2012-11-30 MED ORDER — CHLORHEXIDINE GLUCONATE 4 % EX LIQD
60.0000 mL | Freq: Once | CUTANEOUS | Status: DC
  Filled 2012-11-30: qty 60

## 2012-11-30 MED ORDER — ALLOPURINOL 100 MG PO TABS
100.0000 mg | ORAL_TABLET | Freq: Every day | ORAL | Status: DC
  Administered 2012-11-30 – 2012-12-03 (×4): 100 mg via ORAL
  Filled 2012-11-30 (×4): qty 1

## 2012-11-30 MED ORDER — INSULIN ASPART 100 UNIT/ML ~~LOC~~ SOLN
0.0000 [IU] | Freq: Three times a day (TID) | SUBCUTANEOUS | Status: DC
  Administered 2012-11-30 – 2012-12-03 (×5): 3 [IU] via SUBCUTANEOUS

## 2012-11-30 MED ORDER — BISACODYL 10 MG RE SUPP: 10.0000 mg | Freq: Every day | RECTAL | Status: DC | PRN

## 2012-11-30 MED ORDER — ETOMIDATE 2 MG/ML IV SOLN
INTRAVENOUS | Status: DC | PRN
  Administered 2012-11-30: 12 mg via INTRAVENOUS

## 2012-11-30 MED ORDER — METHOCARBAMOL 500 MG PO TABS
500.0000 mg | ORAL_TABLET | Freq: Four times a day (QID) | ORAL | Status: DC | PRN
  Administered 2012-12-01 – 2012-12-02 (×2): 500 mg via ORAL
  Filled 2012-11-30 (×2): qty 1

## 2012-11-30 MED ORDER — HYDROCODONE-ACETAMINOPHEN 7.5-325 MG PO TABS
1.0000 | ORAL_TABLET | ORAL | Status: DC
  Administered 2012-11-30: 1 via ORAL
  Administered 2012-11-30: 2 via ORAL
  Administered 2012-11-30: 1 via ORAL
  Administered 2012-12-01 (×2): 2 via ORAL
  Administered 2012-12-01: 1 via ORAL
  Administered 2012-12-01 – 2012-12-03 (×11): 2 via ORAL
  Filled 2012-11-30 (×3): qty 2
  Filled 2012-11-30: qty 1
  Filled 2012-11-30 (×5): qty 2
  Filled 2012-11-30: qty 1
  Filled 2012-11-30 (×7): qty 2

## 2012-11-30 MED ORDER — SODIUM CHLORIDE 0.9 % IR SOLN
Status: DC | PRN
  Administered 2012-11-30: 2000 mL

## 2012-11-30 MED ORDER — CLINDAMYCIN PHOSPHATE 900 MG/50ML IV SOLN
900.0000 mg | INTRAVENOUS | Status: AC
  Administered 2012-11-30: 900 mg via INTRAVENOUS

## 2012-11-30 MED ORDER — SEVELAMER CARBONATE 800 MG PO TABS
800.0000 mg | ORAL_TABLET | Freq: Three times a day (TID) | ORAL | Status: DC
  Administered 2012-11-30 – 2012-12-03 (×9): 800 mg via ORAL
  Filled 2012-11-30 (×11): qty 1

## 2012-11-30 MED ORDER — FENTANYL CITRATE 0.05 MG/ML IJ SOLN
25.0000 ug | INTRAMUSCULAR | Status: DC | PRN
  Administered 2012-11-30 (×2): 50 ug via INTRAVENOUS

## 2012-11-30 MED ORDER — DOCUSATE SODIUM 100 MG PO CAPS
100.0000 mg | ORAL_CAPSULE | Freq: Two times a day (BID) | ORAL | Status: DC
  Administered 2012-11-30 – 2012-12-03 (×6): 100 mg via ORAL

## 2012-11-30 MED ORDER — PHENOL 1.4 % MT LIQD: 1.0000 | OROMUCOSAL | Status: DC | PRN

## 2012-11-30 MED ORDER — CARVEDILOL 25 MG PO TABS
25.0000 mg | ORAL_TABLET | Freq: Once | ORAL | Status: AC
  Administered 2012-11-30: 25 mg via ORAL
  Filled 2012-11-30: qty 1

## 2012-11-30 MED ORDER — ACETAMINOPHEN 10 MG/ML IV SOLN: 1000.0000 mg | Freq: Once | INTRAVENOUS | Status: DC | PRN

## 2012-11-30 MED ORDER — FENTANYL CITRATE 0.05 MG/ML IJ SOLN
INTRAMUSCULAR | Status: DC | PRN
  Administered 2012-11-30: 50 ug via INTRAVENOUS

## 2012-11-30 MED ORDER — MUPIROCIN 2 % EX OINT
TOPICAL_OINTMENT | Freq: Two times a day (BID) | CUTANEOUS | Status: DC
  Filled 2012-11-30: qty 22

## 2012-11-30 MED ORDER — FLEET ENEMA 7-19 GM/118ML RE ENEM: 1.0000 | ENEMA | Freq: Once | RECTAL | Status: AC | PRN

## 2012-11-30 MED ORDER — 0.9 % SODIUM CHLORIDE (POUR BTL) OPTIME
TOPICAL | Status: DC | PRN
  Administered 2012-11-30: 1000 mL

## 2012-11-30 MED ORDER — SODIUM CHLORIDE 0.9 % IV SOLN
INTRAVENOUS | Status: DC
  Administered 2012-11-30: 1000 mL via INTRAVENOUS

## 2012-11-30 MED ORDER — WARFARIN - PHARMACIST DOSING INPATIENT: Freq: Every day | Status: DC

## 2012-11-30 MED ORDER — ZOLPIDEM TARTRATE 5 MG PO TABS: 5.0000 mg | ORAL_TABLET | Freq: Every evening | ORAL | Status: DC | PRN

## 2012-11-30 MED ORDER — TRAZODONE HCL 50 MG PO TABS
50.0000 mg | ORAL_TABLET | Freq: Every day | ORAL | Status: DC
  Administered 2012-11-30 – 2012-12-02 (×3): 50 mg via ORAL
  Filled 2012-11-30 (×4): qty 1

## 2012-11-30 MED ORDER — ONDANSETRON HCL 4 MG PO TABS: 4.0000 mg | ORAL_TABLET | Freq: Four times a day (QID) | ORAL | Status: DC | PRN

## 2012-11-30 MED ORDER — WARFARIN SODIUM 2.5 MG PO TABS
2.5000 mg | ORAL_TABLET | Freq: Once | ORAL | Status: DC
  Filled 2012-11-30: qty 1

## 2012-11-30 MED ORDER — LIDOCAINE HCL (CARDIAC) 20 MG/ML IV SOLN
INTRAVENOUS | Status: DC | PRN
  Administered 2012-11-30: 100 mg via INTRAVENOUS

## 2012-11-30 MED ORDER — OXYCODONE HCL ER 10 MG PO T12A
10.0000 mg | EXTENDED_RELEASE_TABLET | Freq: Two times a day (BID) | ORAL | Status: DC
  Administered 2012-11-30 – 2012-12-03 (×6): 10 mg via ORAL
  Filled 2012-11-30 (×6): qty 1

## 2012-11-30 MED ORDER — ONDANSETRON HCL 4 MG/2ML IJ SOLN
INTRAMUSCULAR | Status: DC | PRN
  Administered 2012-11-30: 4 mg via INTRAVENOUS

## 2012-11-30 MED ORDER — SODIUM CHLORIDE 0.9 % IV SOLN
INTRAVENOUS | Status: DC
  Administered 2012-11-30: 17:00:00 via INTRAVENOUS
  Filled 2012-11-30 (×9): qty 1000

## 2012-11-30 SURGICAL SUPPLY — 60 items
BAG ZIPLOCK 12X15 (MISCELLANEOUS) ×4 IMPLANT
BANDAGE ELASTIC 4 VELCRO ST LF (GAUZE/BANDAGES/DRESSINGS) ×2 IMPLANT
BANDAGE ELASTIC 6 VELCRO ST LF (GAUZE/BANDAGES/DRESSINGS) ×2 IMPLANT
BANDAGE ESMARK 6X9 LF (GAUZE/BANDAGES/DRESSINGS) IMPLANT
BANDAGE GAUZE ELAST BULKY 4 IN (GAUZE/BANDAGES/DRESSINGS) ×6 IMPLANT
BNDG COHESIVE 6X5 TAN STRL LF (GAUZE/BANDAGES/DRESSINGS) IMPLANT
BNDG ESMARK 6X9 LF (GAUZE/BANDAGES/DRESSINGS)
CLEANER TIP ELECTROSURG 2X2 (MISCELLANEOUS) ×2 IMPLANT
CLOTH BEACON ORANGE TIMEOUT ST (SAFETY) ×2 IMPLANT
COVER SURGICAL LIGHT HANDLE (MISCELLANEOUS) IMPLANT
CUFF TOURN SGL QUICK 34 (TOURNIQUET CUFF)
CUFF TRNQT CYL 34X4X40X1 (TOURNIQUET CUFF) IMPLANT
DRAPE C-ARM 42X72 X-RAY (DRAPES) ×4 IMPLANT
DRAPE U-SHAPE 47X51 STRL (DRAPES) ×2 IMPLANT
DRSG ADAPTIC 3X8 NADH LF (GAUZE/BANDAGES/DRESSINGS) IMPLANT
DRSG PAD ABDOMINAL 8X10 ST (GAUZE/BANDAGES/DRESSINGS) ×4 IMPLANT
DURAPREP 26ML APPLICATOR (WOUND CARE) ×2 IMPLANT
ELECT REM PT RETURN 9FT ADLT (ELECTROSURGICAL) ×2
ELECTRODE REM PT RTRN 9FT ADLT (ELECTROSURGICAL) ×1 IMPLANT
EVACUATOR 1/8 PVC DRAIN (DRAIN) ×2 IMPLANT
EVACUATOR SILICONE 100CC (DRAIN) IMPLANT
GAUZE XEROFORM 1X8 LF (GAUZE/BANDAGES/DRESSINGS) ×2 IMPLANT
GLOVE BIOGEL PI IND STRL 7.5 (GLOVE) ×1 IMPLANT
GLOVE BIOGEL PI IND STRL 8 (GLOVE) ×1 IMPLANT
GLOVE BIOGEL PI INDICATOR 7.5 (GLOVE) ×1
GLOVE BIOGEL PI INDICATOR 8 (GLOVE) ×1
GLOVE ECLIPSE 8.0 STRL XLNG CF (GLOVE) IMPLANT
GLOVE ORTHO TXT STRL SZ7.5 (GLOVE) ×4 IMPLANT
GLOVE SURG ORTHO 8.0 STRL STRW (GLOVE) ×2 IMPLANT
GOWN BRE IMP PREV XXLGXLNG (GOWN DISPOSABLE) ×2 IMPLANT
GOWN STRL NON-REIN LRG LVL3 (GOWN DISPOSABLE) ×4 IMPLANT
HANDPIECE INTERPULSE COAX TIP (DISPOSABLE) ×1
IMMOBILIZER KNEE 20 (SOFTGOODS) ×2
IMMOBILIZER KNEE 20 THIGH 36 (SOFTGOODS) ×1 IMPLANT
KIT BASIN OR (CUSTOM PROCEDURE TRAY) ×2 IMPLANT
MANIFOLD NEPTUNE II (INSTRUMENTS) ×2 IMPLANT
NEEDLE HYPO 22GX1.5 SAFETY (NEEDLE) IMPLANT
NS IRRIG 1000ML POUR BTL (IV SOLUTION) ×2 IMPLANT
PACK LOWER EXTREMITY WL (CUSTOM PROCEDURE TRAY) IMPLANT
PACK TOTAL JOINT (CUSTOM PROCEDURE TRAY) ×2 IMPLANT
PADDING CAST COTTON 6X4 STRL (CAST SUPPLIES) IMPLANT
POSITIONER SURGICAL ARM (MISCELLANEOUS) ×2 IMPLANT
SET HNDPC FAN SPRY TIP SCT (DISPOSABLE) ×1 IMPLANT
SOL PREP POV-IOD 16OZ 10% (MISCELLANEOUS) IMPLANT
SOL PREP PROV IODINE SCRUB 4OZ (MISCELLANEOUS) ×4 IMPLANT
SPONGE GAUZE 4X4 12PLY (GAUZE/BANDAGES/DRESSINGS) IMPLANT
SPONGE LAP 18X18 X RAY DECT (DISPOSABLE) IMPLANT
STAPLER VISISTAT 35W (STAPLE) IMPLANT
STOCKINETTE 8 INCH (MISCELLANEOUS) IMPLANT
STRIP CLOSURE SKIN 1/2X4 (GAUZE/BANDAGES/DRESSINGS) ×2 IMPLANT
SUCTION FRAZIER TIP 10 FR DISP (SUCTIONS) ×2 IMPLANT
SUT ETHILON 2 0 PS N (SUTURE) ×6 IMPLANT
SUT ETHILON 3 0 PS 1 (SUTURE) IMPLANT
SUT VIC AB 1 CT1 36 (SUTURE) ×2 IMPLANT
SUT VIC AB 2-0 CT1 27 (SUTURE)
SUT VIC AB 2-0 CT1 TAPERPNT 27 (SUTURE) IMPLANT
SYR CONTROL 10ML LL (SYRINGE) ×2 IMPLANT
TOWEL OR 17X26 10 PK STRL BLUE (TOWEL DISPOSABLE) ×4 IMPLANT
WATER STERILE IRR 1500ML POUR (IV SOLUTION) IMPLANT
YANKAUER SUCT BULB TIP 10FT TU (MISCELLANEOUS) IMPLANT

## 2012-11-30 NOTE — Anesthesia Postprocedure Evaluation (Signed)
Anesthesia Post Note  Patient: Gina Ashley  Procedure(s) Performed: Procedure(s) (LRB): EXTERNAL FIXATION LEG (Left)  Anesthesia type: General  Patient location: PACU  Post pain: Pain level controlled  Post assessment: Post-op Vital signs reviewed  Last Vitals: BP 123/80  Pulse 92  Temp 36.5 C (Oral)  Resp 12  SpO2 87%  Post vital signs: Reviewed  Level of consciousness: sedated  Complications: No apparent anesthesia complications

## 2012-11-30 NOTE — H&P (Signed)
Gina Ashley is an 79 y.o. female.    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 female known to me from fall in October 2013.  At that time we fixed her right distal femur with ORIF with plate.  Her left proximal tibia was deemed to be too close to the prosthetic component so I decided to place her in an external fixator.  She recently had repeat fall with x-rays that raised concern of extension of the fracture.  I reviewed the case and situation with Dr. Myrene Galas and came to a plan that includes removing the external fixator, cleaning pin sites and giving her a "holiday" from pin tracts.  She will be assessed in the OR under fluoro to check for fracture healing.  If the fracture doesn't appear to be healing then she will be referred to Dr. Carola Frost for definitive treatment discussion.  PCP:  Ernestine Conrad, MD  D/C Plans: To be transferred back to skilled care facility  PMH: Past Medical History  Diagnosis Date  . GERD (gastroesophageal reflux disease)   . Back pain, chronic   . Pain in shoulder   . Pain in ankle   . Hiatal hernia   . HTN (hypertension)     all her life  . CKD (chronic kidney disease), stage III     GFR: 38  . Atrial fibrillation     since 1996  . Sleep apnea   . Fall 3 weeks ago    was evaluated at [facility]  . OSA on CPAP   . Depression   . PONV (postoperative nausea and vomiting)     difficult to wake up  . Chronic diastolic heart failure 08/31/2012  . Tibia fracture     BILATERAL  . Renal failure     acute on chronic   . Fibula fracture     bilateral  . Respiratory failure     acute on chronic hx of  requiring BIPAP  . COPD (chronic obstructive pulmonary disease)   . Metabolic encephalopathy     hx of   . Renal failure     acute on chronic with need for intermittent dialysis - hx of   . UTI (urinary tract infection)     hx of   . Pleural effusion     right sided  now resolved   . Diabetes mellitus     x 15 yrs, hx of hypoglycemia   . CHF (congestive heart failure)     diastolic HF    PSH: Past Surgical History  Procedure Date  . Bunionectomy     bilateral  . Cholecystectomy   . Rotator cuff repair     2002  . Total knee arthroplasty     bilateral 2001  . Hammer toe surgery     2004  . Colonoscopy 07/11/2011    Procedure: COLONOSCOPY;  Surgeon: Malissa Hippo, MD;  Location: [facility];  Service: Endoscopy;  Laterality: N/A;  3:00  . Orif periprosthetic fracture 09/08/2012    Procedure: OPEN REDUCTION INTERNAL FIXATION (ORIF) PERIPROSTHETIC FRACTURE;  Surgeon: Shelda Pal, MD;  Location: [facility] ORS;  Service: Orthopedics;  Laterality: Right;  ORIF periprosthetic right proximal femur fracture Spanning external fixator left distal tibia  . External fixation leg 09/08/2012    Procedure: EXTERNAL FIXATION LEG;  Surgeon: Shelda Pal, MD;  Location: [facility] ORS;  Service: Orthopedics;  Laterality: Left;  .  Joint replacement     bilateral knees     Social History:  reports that she has never smoked. She has never used smokeless tobacco. She reports that she does not drink alcohol or use illicit drugs.  Allergies:  Allergies  Allergen Reactions  . Morphine Sulfate Other (See Comments)    REACTION: change in personality  . Remeron (Mirtazapine) Other (See Comments)    Altered mental status, lethargy  . Tuna (Fish Allergy) Other (See Comments)    unknown  . Penicillins Rash    Tolerates Ancef.    Medications: Medications Prior to Admission  Medication Sig Dispense Refill  . allopurinol (ZYLOPRIM) 100 MG tablet Take 100 mg by mouth daily.      Marland Kitchen amiodarone (PACERONE) 200 MG tablet Take 200 mg by mouth daily before breakfast.       . carvedilol (COREG) 25 MG tablet Take 25 mg by mouth 2 (two) times daily with a meal.      . docusate sodium (COLACE) 100 MG capsule Take 100 mg by mouth 2 (two) times daily.      Marland Kitchen doxycycline (VIBRAMYCIN) 100  MG capsule Take 1 capsule (100 mg total) by mouth 2 (two) times daily.  20 capsule  0  . guaiFENesin (MUCINEX) 600 MG 12 hr tablet Take 600 mg by mouth 2 (two) times daily.       . hydrALAZINE (APRESOLINE) 25 MG tablet Take 25 mg by mouth 3 (three) times daily.      Marland Kitchen HYDROcodone-acetaminophen (NORCO/VICODIN) 5-325 MG per tablet Take 2 tablets by mouth every 4 (four) hours as needed. Pain      . insulin glargine (LANTUS) 100 UNIT/ML injection Inject 5 Units into the skin at bedtime.      . isosorbide dinitrate (ISORDIL) 20 MG tablet Take 20 mg by mouth 3 (three) times daily.      Marland Kitchen oxyCODONE (OXYCONTIN) 10 MG 12 hr tablet Take 10 mg by mouth every 12 (twelve) hours.      . Oxycodone HCl 10 MG TABS Take 1 mg by mouth every 4 (four) hours as needed. Pain      . pantoprazole (PROTONIX) 40 MG tablet Take 40 mg by mouth daily.      . polyethylene glycol (MIRALAX / GLYCOLAX) packet Take 17 g by mouth 2 (two) times daily.       . sevelamer carbonate (RENVELA) 800 MG tablet Take 800 mg by mouth 3 (three) times daily with meals.      . traZODone (DESYREL) 50 MG tablet Take 50 mg by mouth at bedtime.      . bisacodyl (DULCOLAX) 10 MG suppository Place 1 suppository (10 mg total) rectally daily as needed.  12 suppository    . darbepoetin (ARANESP) 150 MCG/0.3ML SOLN Inject 125 mcg into the skin every 7 (seven) days.      Marland Kitchen HYDRALAZINE HCL IJ Inject 10 mg as directed every 4 (four) hours as needed. heartbeat      . insulin aspart (NOVOLOG) 100 UNIT/ML injection Inject 1-5 Units into the skin 3 (three) times daily with meals. 150-200=1unit,201-250=2units,251-300=3units,301-350=4units,351-400=5units if over 400 call MD      . lactulose (CHRONULAC) 10 GM/15ML solution Take 20 g by mouth daily as needed. Constipation      . miconazole (MICOTIN) 2 % powder Apply 1 application topically as needed. rash      . nitroGLYCERIN (NITROSTAT) 0.4 MG SL tablet Place 0.4 mg under the tongue every 5 (five) minutes as needed.  Chest pain      . ONDANSETRON HCL IJ Inject 4 mg into the vein every 6 (six) hours as needed. Iv push every 6 hours for nausea      . sodium chloride (OCEAN) 0.65 % nasal spray Place 1 spray into the nose every 6 (six) hours as needed. Dry nares      . sodium chloride 0.9 % SOLN 100 mL with iron sucrose 20 MG/ML SOLN Inject 100 mg into the vein as directed. 3 times per week      . Vitamin D, Ergocalciferol, (DRISDOL) 50000 UNITS CAPS Take 50,000 Units by mouth every 7 (seven) days.      . vitamin k 100 MCG tablet Take 100 mcg by mouth daily.      Marland Kitchen warfarin (COUMADIN) 2.5 MG tablet Take 2.5 mg by mouth daily before breakfast.       . Water For Irrigation, Sterile (STERILE WATER FOR IRRIGATION) Irrigate with as directed once.        Results for orders placed during the hospital encounter of 11/30/12 (from the past 48 hour(s))  URINALYSIS, ROUTINE W REFLEX MICROSCOPIC     Status: Abnormal   Collection Time   11/30/12  8:45 AM      Component Value Range Comment   Color, Urine YELLOW  YELLOW    APPearance CLEAR  CLEAR    Specific Gravity, Urine 1.003 (*) 1.005 - 1.030    pH 6.5  5.0 - 8.0    Glucose, UA NEGATIVE  NEGATIVE mg/dL    Hgb urine dipstick NEGATIVE  NEGATIVE    Bilirubin Urine NEGATIVE  NEGATIVE    Ketones, ur NEGATIVE  NEGATIVE mg/dL    Protein, ur NEGATIVE  NEGATIVE mg/dL    Urobilinogen, UA 0.2  0.0 - 1.0 mg/dL    Nitrite NEGATIVE  NEGATIVE    Leukocytes, UA NEGATIVE  NEGATIVE MICROSCOPIC NOT DONE ON URINES WITH NEGATIVE PROTEIN, BLOOD, LEUKOCYTES, NITRITE, OR GLUCOSE <1000 mg/dL.  CBC     Status: Abnormal   Collection Time   11/30/12  9:00 AM      Component Value Range Comment   WBC 6.5  4.0 - 10.5 K/uL    RBC 4.26  3.87 - 5.11 MIL/uL    Hemoglobin 11.5 (*) 12.0 - 15.0 g/dL    HCT 40.9  81.1 - 91.4 %    MCV 86.9  78.0 - 100.0 fL    MCH 27.0  26.0 - 34.0 pg    MCHC 31.1  30.0 - 36.0 g/dL    RDW 78.2 (*) 95.6 - 15.5 %    Platelets PLATELET CLUMPS NOTED ON SMEAR, UNABLE TO  ESTIMATE  150 - 400 K/uL   PROTIME-INR     Status: Abnormal   Collection Time   11/30/12  9:00 AM      Component Value Range Comment   Prothrombin Time 17.5 (*) 11.6 - 15.2 seconds    INR 1.48  0.00 - 1.49   APTT     Status: Normal   Collection Time   11/30/12  9:00 AM      Component Value Range Comment   aPTT 24  24 - 37 seconds   BASIC METABOLIC PANEL     Status: Abnormal   Collection Time   11/30/12  9:23 AM      Component Value Range Comment   Sodium 131 (*) 135 - 145 mEq/L    Potassium 3.5  3.5 - 5.1 mEq/L    Chloride 89 (*)  96 - 112 mEq/L    CO2 33 (*) 19 - 32 mEq/L    Glucose, Bld 118 (*) 70 - 99 mg/dL    BUN 43 (*) 6 - 23 mg/dL    Creatinine, Ser 1.61 (*) 0.50 - 1.10 mg/dL    Calcium 9.6  8.4 - 09.6 mg/dL    GFR calc non Af Amer 27 (*) >90 mL/min    GFR calc Af Amer 31 (*) >90 mL/min   TYPE AND SCREEN     Status: Normal (Preliminary result)   Collection Time   11/30/12  9:27 AM      Component Value Range Comment   ABO/RH(D) A POS      Antibody Screen POS      Sample Expiration 12/03/2012      Antibody Identification ANTI-Fya (DUFFY a)      DAT, IgG NEG      Unit Number [id number]      Blood Component Type RED CELLS,LR      Unit division 00      Status of Unit ALLOCATED      Donor AG Type NEGATIVE FOR DUFFY A ANTIGEN      Transfusion Status OK TO TRANSFUSE      Crossmatch Result COMPATIBLE      Unit Number [id number]      Blood Component Type RED CELLS,LR      Unit division 00      Status of Unit ALLOCATED      Donor AG Type NEGATIVE FOR DUFFY A ANTIGEN      Transfusion Status OK TO TRANSFUSE      Crossmatch Result COMPATIBLE      No results found.  ROS: Review of Systems - fairly deconditioned state No recent infections, no cough  No chest pain No constipation or diarrhea  Pain in left lower extremity Right lower extremity healing well no events or complications  Physican Exam: Blood pressure 129/81, pulse 100, temperature 97.4 F (36.3 C),  temperature source Oral, resp. rate 15, SpO2 100.00%.  Seen and evaluated Comfortable but deconditioned Lungs clear Heart regular rate  Left lower extremity with external fixator in place, no wound drainage, no obvious cellulitis, leg moves well without too much pain Right lower extremity healed incision , limited movement, but significant pain  Bilateral lower extremity swelling Intact sensibility  Assessment/Plan Assessment:  Left lower extremitiy nonunion versus delayed union  Plan: Patient will have her external fixator removed and fracture site stressed to assess for healing.  She will be placed in a splint post-operatively. Risks benefits and expectation were discussed with the patient. Patient understand risks, benefits and expectation and wishes to proceed.  Definitve plan will follow based on intra-operative findings and will be reviewed with family.   Madlyn Frankel Charlann Boxer, MD  11/30/2012, 12:11 PM

## 2012-11-30 NOTE — Transfer of Care (Signed)
Immediate Anesthesia Transfer of Care Note  Patient: Gina Ashley  Procedure(s) Performed: Procedure(s) (LRB) with comments: EXTERNAL FIXATION LEG (Left) - Removal of External Fixation Left Knee with Evaluation with Floroscopy  Patient Location: PACU  Anesthesia Type:General  Level of Consciousness: sedated  Airway & Oxygen Therapy: Patient Spontanous Breathing and Patient connected to face mask oxygen  Post-op Assessment: Report given to PACU RN and Post -op Vital signs reviewed and stable  Post vital signs: Reviewed and stable  Complications: No apparent anesthesia complications

## 2012-11-30 NOTE — Progress Notes (Signed)
ANTICOAGULATION CONSULT NOTE - Initial Consult  Pharmacy Consult for Warfarin Indication: VTE Prophylaxis  Allergies  Allergen Reactions  . Morphine Sulfate Other (See Comments)    REACTION: change in personality  . Remeron (Mirtazapine) Other (See Comments)    Altered mental status, lethargy  . Tuna (Fish Allergy) Other (See Comments)    unknown  . Penicillins Rash    Tolerates Ancef.    Patient Measurements: Height: 5\' 3"  (160 cm) Weight: 218 lb (98.884 kg) IBW/kg (Calculated) : 52.4   Vital Signs: Temp: 97.7 F (36.5 C) (01/27 1500) Temp src: Oral (01/27 1500) BP: 123/80 mmHg (01/27 1500) Pulse Rate: 92  (01/27 1500)  Labs:  Basename 11/30/12 0923 11/30/12 0900  HGB -- 11.5*  HCT -- 37.0  PLT -- PLATELET CLUMPS NOTED ON SMEAR, UNABLE TO ESTIMATE  APTT -- 24  LABPROT -- 17.5*  INR -- 1.48  HEPARINUNFRC -- --  CREATININE 1.70* --  CKTOTAL -- --  CKMB -- --  TROPONINI -- --    Estimated Creatinine Clearance: 28.1 ml/min (by C-G formula based on Cr of 1.7).   Medical History: Past Medical History  Diagnosis Date  . GERD (gastroesophageal reflux disease)   . Back pain, chronic   . Pain in shoulder   . Pain in ankle   . Hiatal hernia   . HTN (hypertension)     all her life  . CKD (chronic kidney disease), stage III     GFR: 38  . Atrial fibrillation     since 1996  . Sleep apnea   . Fall 3 weeks ago    was evaluated at [facility]  . OSA on CPAP   . Depression   . PONV (postoperative nausea and vomiting)     difficult to wake up  . Chronic diastolic heart failure 08/31/2012  . Tibia fracture     BILATERAL  . Renal failure     acute on chronic   . Fibula fracture     bilateral  . Respiratory failure     acute on chronic hx of  requiring BIPAP  . COPD (chronic obstructive pulmonary disease)   . Metabolic encephalopathy     hx of   . Renal failure     acute on chronic with need for intermittent dialysis - hx of   . UTI (urinary tract  infection)     hx of   . Pleural effusion     right sided now resolved   . Diabetes mellitus     x 15 yrs, hx of hypoglycemia   . CHF (congestive heart failure)     diastolic HF    Medications:  Scheduled:    . allopurinol  100 mg Oral Daily  . amiodarone  200 mg Oral QAC breakfast  . [COMPLETED] carvedilol  25 mg Oral Once  . carvedilol  25 mg Oral BID WC  . celecoxib  200 mg Oral Q12H  . clindamycin (CLEOCIN) IV  600 mg Intravenous Q6H  . [COMPLETED] clindamycin (CLEOCIN) IV  900 mg Intravenous On Call to OR  . docusate sodium  100 mg Oral BID  . ferrous sulfate  325 mg Oral TID PC  . guaiFENesin  600 mg Oral BID  . hydrALAZINE  25 mg Oral TID  . HYDROcodone-acetaminophen  1-2 tablet Oral Q4H  . insulin aspart  0-20 Units Subcutaneous TID WC  . insulin glargine  5 Units Subcutaneous QHS  . isosorbide dinitrate  20 mg Oral TID  .  oxyCODONE  10 mg Oral Q12H  . pantoprazole  40 mg Oral Daily  . polyethylene glycol  17 g Oral BID  . sevelamer carbonate  800 mg Oral TID WC  . traZODone  50 mg Oral QHS  . [DISCONTINUED] chlorhexidine  60 mL Topical Once  . [DISCONTINUED] mupirocin ointment   Nasal BID   Infusions:    . sodium chloride 0.9 % 1,000 mL with potassium chloride 10 mEq infusion    . [DISCONTINUED] sodium chloride 1,000 mL (11/30/12 1108)    Assessment: 79 yo F with hx of Afib (on warfarin prior to admission) admitted 1/27 and is now s/p ortho procedure. Home warfarin dose reported as 2.5mg  daily (last taken 1/24). Of note, apparently the patient was also receiving 100 mcg Vit K PO daily at the SNF prior to admission. Current INR = 1.48. Will start with usual home regimen of 2.5mg  (instead of using 1.5x that dose) since current INR is at upper end of therapeutic range. Pt may require lower warfarin dosing in hospital since the patient is not receiving PO Vit K daily in hospital like at SNF. Drug interaction with amiodarone and allopurinol noted, although these  are usual home meds.  Goal of Therapy:  INR 2-3 Monitor platelets by anticoagulation protocol: Yes   Plan:  1) Warfarin 2.5mg  PO x1 at 20:00 2) Daily INR  Darrol Angel, PharmD Pager: [phone] 11/30/2012,3:22 PM

## 2012-11-30 NOTE — Anesthesia Preprocedure Evaluation (Addendum)
Anesthesia Evaluation  Patient identified by MRN, date of birth, ID band Patient awake    Reviewed: Allergy & Precautions, H&P , NPO status , Patient's Chart, lab work & pertinent test results, reviewed documented beta blocker date and time   History of Anesthesia Complications (+) PONV  Airway Mallampati: II TM Distance: >3 FB Neck ROM: Full    Dental  (+) Edentulous Upper and Dental Advisory Given   Pulmonary neg pulmonary ROS, sleep apnea and Continuous Positive Airway Pressure Ventilation , COPD COPD inhaler,  Bilateral pleural effusions. Pt has been on biPap today. ABG pco2 70  CXR report reviewed.   Dr. Kavin Leech note reviewed.   + decreased breath sounds      Cardiovascular hypertension, Pt. on medications and Pt. on home beta blockers +CHF negative cardio ROS  + dysrhythmias Atrial Fibrillation Rhythm:Irregular Rate:Normal  Echo 08/2012 - Left ventricle: The cavity size was normal. Systolic function was normal. The estimated ejection fraction was in the range of 55% to 60%. Wall motion was normal; there  were no regional wall motion abnormalities. The study is not technically sufficient to allow evaluation of LV diastolic function. - Mitral valve: Moderately to severely calcified annulus. Mild regurgitation.  - Left atrium: The atrium was mildly dilated. - Right atrium: The atrium was moderately dilated. - Pulmonary arteries: Systolic pressure was moderately increased. PA peak pressure: 56mm Hg (S).    Neuro/Psych PSYCHIATRIC DISORDERS Depression  Neuromuscular disease    GI/Hepatic negative GI ROS, Neg liver ROS, hiatal hernia, GERD-  Medicated,  Endo/Other  diabetes, Type 2, Insulin DependentMorbid obesity  Renal/GU ARF, CRF and Renal InsufficiencyRenal disease   CRF stage 4, not a HD candidate.  Now on diamox.     Musculoskeletal negative musculoskeletal ROS (+)   Abdominal (+) + obese,   Peds negative  pediatric ROS (+)  Hematology negative hematology ROS (+)   Anesthesia Other Findings   Reproductive/Obstetrics                         Anesthesia Physical  Anesthesia Plan  ASA: IV  Anesthesia Plan: General   Post-op Pain Management:    Induction: Intravenous  Airway Management Planned: Oral ETT  Additional Equipment:   Intra-op Plan:   Post-operative Plan: Possible Post-op intubation/ventilation  Informed Consent: I have reviewed the patients History and Physical, chart, labs and discussed the procedure including the risks, benefits and alternatives for the proposed anesthesia with the patient or authorized representative who has indicated his/her understanding and acceptance.   Dental advisory given  Plan Discussed with: CRNA  Anesthesia Plan Comments:       Anesthesia Quick Evaluation

## 2012-11-30 NOTE — Brief Op Note (Signed)
11/30/2012  3:55 PM  PATIENT:  Gina Ashley  79 y.o. female  PRE-OPERATIVE DIAGNOSIS:  external fixation due to periprosthetic left proximal tibia fracture  POST-OPERATIVE DIAGNOSIS:   external fixation due to periprosthetic left proximal tibia fracture  PROCEDURE:  Procedure(s) (LRB) with comments: EXTERNAL FIXATION LEG (Left) - Removal of External Fixator Left Knee, 2.  Stress radiography to evaluate fracture healing  SURGEON:  Surgeon(s) and Role:    * Shelda Pal, MD - Primary  PHYSICIAN ASSISTANT: Lanney Gins, PA-C  ANESTHESIA:   general  EBL:  Total I/O In: 900 [I.V.:900] Out: 400 [Urine:400]  BLOOD ADMINISTERED:none  DRAINS: none   LOCAL MEDICATIONS USED:  NONE  SPECIMEN:  No Specimen  DISPOSITION OF SPECIMEN:  N/A  COUNTS:  YES  TOURNIQUET:  * No tourniquets in log *  DICTATION: .Other Dictation: Dictation Number [id number]  PLAN OF CARE: Admit to inpatient   PATIENT DISPOSITION:  PACU - hemodynamically stable.   Delay start of Pharmacological VTE agent (>24hrs) due to surgical blood loss or risk of bleeding: no

## 2012-12-01 ENCOUNTER — Inpatient Hospital Stay (HOSPITAL_COMMUNITY): Payer: Medicare Other

## 2012-12-01 ENCOUNTER — Encounter (HOSPITAL_COMMUNITY): Payer: Self-pay | Admitting: Orthopedic Surgery

## 2012-12-01 DIAGNOSIS — E669 Obesity, unspecified: Secondary | ICD-10-CM | POA: Diagnosis present

## 2012-12-01 LAB — CBC
HCT: 34.8 % — ABNORMAL LOW (ref 36.0–46.0)
Hemoglobin: 10.6 g/dL — ABNORMAL LOW (ref 12.0–15.0)
MCH: 26.6 pg (ref 26.0–34.0)
MCV: 87.4 fL (ref 78.0–100.0)
RBC: 3.98 MIL/uL (ref 3.87–5.11)

## 2012-12-01 LAB — GLUCOSE, CAPILLARY
Glucose-Capillary: 82 mg/dL (ref 70–99)
Glucose-Capillary: 126 mg/dL — ABNORMAL HIGH (ref 70–99)
Glucose-Capillary: 111 mg/dL — ABNORMAL HIGH (ref 70–99)
Glucose-Capillary: 223 mg/dL — ABNORMAL HIGH (ref 70–99)

## 2012-12-01 LAB — BASIC METABOLIC PANEL
CO2: 30 mEq/L (ref 19–32)
Calcium: 9 mg/dL (ref 8.4–10.5)
Creatinine, Ser: 1.66 mg/dL — ABNORMAL HIGH (ref 0.50–1.10)
Glucose, Bld: 105 mg/dL — ABNORMAL HIGH (ref 70–99)

## 2012-12-01 LAB — PROTIME-INR: INR: 1.66 — ABNORMAL HIGH (ref 0.00–1.49)

## 2012-12-01 MED ORDER — HEPARIN SODIUM (PORCINE) 1000 UNIT/ML IJ SOLN
10000.0000 [IU] | Freq: Once | INTRAMUSCULAR | Status: DC
  Filled 2012-12-01: qty 10

## 2012-12-01 MED ORDER — ENOXAPARIN SODIUM 30 MG/0.3ML ~~LOC~~ SOLN
30.0000 mg | SUBCUTANEOUS | Status: DC
  Administered 2012-12-01 – 2012-12-02 (×2): 30 mg via SUBCUTANEOUS
  Filled 2012-12-01 (×3): qty 0.3

## 2012-12-01 MED ORDER — SODIUM CHLORIDE 0.9 % IJ SOLN: 10.0000 mL | INTRAMUSCULAR | Status: DC | PRN

## 2012-12-01 MED ORDER — WARFARIN SODIUM 1 MG PO TABS
1.0000 mg | ORAL_TABLET | Freq: Once | ORAL | Status: AC
  Administered 2012-12-01: 1 mg via ORAL
  Filled 2012-12-01: qty 1

## 2012-12-01 NOTE — Evaluation (Signed)
Physical Therapy Evaluation Patient Details Name: Gina Ashley MRN: [id number] DOB: December 06, 1926 Today's Date: 12/01/2012 Time: 1000-1034 PT Time Calculation (min): 34 min  PT Assessment / Plan / Recommendation Clinical Impression  79 yo female s/p removal of external fixator L lower leg. Pt has non-union of L prox tibia fracture. On eval, pt requires +2 assist for mobility, ant-posterior transfer to recliner. Recommend SNF for continued rehab.     PT Assessment  Patient needs continued PT services    Follow Up Recommendations  SNF    Does the patient have the potential to tolerate intense rehabilitation      Barriers to Discharge        Equipment Recommendations  None recommended by PT    Recommendations for Other Services OT consult   Frequency Min 3X/week    Precautions / Restrictions Precautions Required Braces or Orthoses: Knee Immobilizer - Left Knee Immobilizer - Left: On at all times Restrictions Weight Bearing Restrictions: Yes LLE Weight Bearing: Non weight bearing   Pertinent Vitals/Pain 7/10 L LE      Mobility  Bed Mobility Bed Mobility: Supine to Sit Supine to Sit: 1: +2 Total assist Supine to Sit: Patient Percentage: 20% Details for Bed Mobility Assistance: VCS safety, technique, hand placement. assist needed for bil LEs and trunk to upright. Utilized bedpad to assist with scooting, positioning. Increased time.  Transfers Transfers: Risk manager: 1: +2 Total assist Anterior-Posterior Transfers: Patient Percentage: 20% Details for Transfer Assistance: VCs safety, technique, hand placement, posture. Pt attempting to lean back instead of sitting fully upright. Utilized bedpad to assist with scooting.  Ambulation/Gait Ambulation/Gait Assistance: Not tested (comment) Ambulation/Gait Assistance Details: Pt unable    Shoulder Instructions     Exercises     PT Diagnosis: Difficulty walking;Acute pain  PT  Problem List: Decreased strength;Decreased range of motion;Decreased activity tolerance;Decreased balance;Decreased mobility;Pain;Decreased knowledge of precautions;Decreased knowledge of use of DME PT Treatment Interventions: DME instruction;Functional mobility training;Therapeutic activities;Therapeutic exercise;Balance training;Patient/family education   PT Goals Acute Rehab PT Goals PT Goal Formulation: With patient Time For Goal Achievement: 12/15/12 Potential to Achieve Goals: Good Pt will go Supine/Side to Sit: with max assist PT Goal: Supine/Side to Sit - Progress: Goal set today Pt will go Sit to Supine/Side: with max assist PT Goal: Sit to Supine/Side - Progress: Goal set today Pt will go Sit to Stand: with +2 total assist PT Goal: Sit to Stand - Progress: Goal set today Pt will Transfer Bed to Chair/Chair to Bed: with +2 total assist;Other (comment) PT Transfer Goal: Bed to Chair/Chair to Bed - Progress: Goal set today Pt will Stand: with +2 total assist;1 - 2 min;with bilateral upper extremity support PT Goal: Stand - Progress: Goal set today  Visit Information  Last PT Received On: 12/01/12 Assistance Needed: +2    Subjective Data  Subjective: "Oh I know I have to do it" Patient Stated Goal: Get better.    Prior Functioning  Home Living Available Help at Discharge: Skilled Nursing Facility Prior Function Level of Independence: Needs assistance Needs Assistance: Bathing;Dressing;Grooming;Toileting;Meal Prep;Transfers Bath: Total Dressing: Total Grooming: Total Toileting: Total Meal Prep: Total Transfer Assistance: Pt reports performing ant-post transfers at SNF with PT. Has not attempted to stand yet.  Able to Take Stairs?: No Driving: No Communication Communication: No difficulties    Cognition  Overall Cognitive Status: Appears within functional limits for tasks assessed/performed Arousal/Alertness: Awake/alert Orientation Level: Appears intact for tasks  assessed Behavior During Session: [facility] for tasks performed  Extremity/Trunk Assessment Right Lower Extremity Assessment RLE ROM/Strength/Tone: [facility] for tasks assessed Left Lower Extremity Assessment LLE ROM/Strength/Tone: Deficits;Unable to fully assess;Due to pain LLE ROM/Strength/Tone Deficits: hip flex 3-/5, hip abd/add 2/5, DF/PF 2/5   Balance Balance Balance Assessed: Yes Static Sitting Balance Static Sitting - Balance Support: Bilateral upper extremity supported;Feet supported Static Sitting - Level of Assistance: 3: Mod assist Dynamic Sitting Balance Dynamic Sitting - Balance Support: Bilateral upper extremity supported;Feet supported;During functional activity Dynamic Sitting - Level of Assistance: 3: Mod assist  End of Session PT - End of Session Activity Tolerance: Patient tolerated treatment well Patient left: in chair;with call bell/phone within reach Nurse Communication: Need for lift equipment  GP     [name] Alert [facility] 12/01/2012, 11:12 AM [phone]

## 2012-12-01 NOTE — Progress Notes (Signed)
   Subjective: 1 Day Post-Op Procedure(s) (LRB): EXTERNAL FIXATION LEG (Left)   Patient reports pain as mild, pain well controlled. No events throughout the night.   Objective:   VITALS:   Filed Vitals:   12/01/12 1426  BP: 117/77  Pulse: 91  Temp: 98 F (36.7 C)  Resp: 16    Neurovascular intact Dorsiflexion/Plantar flexion intact Incision: dressing C/D/I No cellulitis present Compartment soft  LABS  Basename 12/01/12 0425 11/30/12 0900  HGB 10.6* 11.5*  HCT 34.8* 37.0  WBC 6.3 6.5  PLT 147* PLATELET CLUMPS NOTED ON SMEAR, UNABLE TO ESTIMATE     Basename 12/01/12 0425 11/30/12 0923  NA 129* 131*  K 3.4* 3.5  BUN 42* 43*  CREATININE 1.66* 1.70*  GLUCOSE 105* 118*     Assessment/Plan: 1 Day Post-Op Procedure(s) (LRB): EXTERNAL FIXATION LEG (Left) Advance diet Up with therapy Discharge to SNF eventually Order for a new PICC line to be placed, since the current one is not working properly and it is tough to get IV access Ordered a CT of the left proximal tibia to evaluate periprosthetic fx  Expected ABLA  Treated with iron and will observe  Obese (BMI 30-39.9)  Estimated Body mass index is 38.62 kg/(m^2) as calculated from the following:   Height as of this encounter: 5\' 3" (1.6 m).   Weight as of this encounter: 218 lb(98.884 kg). Patient also counseled that weight may inhibit the healing process Patient counseled that losing weight will help with future health issues  Hyponatremia Treated with IV fluids and will observe  Hypokalemia Will observe       Anastasio Auerbach. Jossalyn Forgione   PAC  12/01/2012, 3:16 PM

## 2012-12-01 NOTE — Op Note (Signed)
Gina Ashley, SALINE NO.:  [id number]  MEDICAL RECORD NO.:  [id number]  LOCATION:  1606                         FACILITY:  [facility]  PHYSICIAN:  Madlyn Frankel. Charlann Boxer, M.D.  DATE OF BIRTH:  1927-02-19  DATE OF PROCEDURE:  11/30/2012 DATE OF DISCHARGE:                              OPERATIVE REPORT   PREOPERATIVE DIAGNOSIS:  Status post placement of spanning external fixator of the left knee for a proximal tibial periprosthetic fracture.  POSTOPERATIVE DIAGNOSIS:  Status post placement of spanning external fixator of the left knee for a proximal tibial periprosthetic fracture.  PROCEDURE: 1. Removal of left leg spanning external fixator. 2. Stress radiography to evaluate for fracture union.  SURGEON:  Madlyn Frankel. Charlann Boxer, M.D.  ASSISTANT:  Lanney Gins, PA-C.  Note that Mr. Carmon Sails was present for the entirety of the case from perioperative management of the extremity, preoperative position and general facilitation of the case and wound closure.  ANESTHESIA:  General.  SPECIMENS:  None.  COMPLICATION:  None apparent.  DRAINS:  None utilized.  FINDINGS: 1. Upon removal of the pins, which were somewhat loose within the     bone, there was no evidence of any fracture.  No purulence     identified in any of the pin sites. 2. Under stress radiography, I was unable to detect significant motion     at the fracture site along the medial proximal tibia with varus and     valgus stresses.  This was done statically as well as with the use     of fluoro.  INDICATIONS FOR PROCEDURE:  Gina Ashley is an 79 year old female who in October 2013 had a fall.  She sustained a right distal femur periprosthetic fracture as well as this left proximal tibial periprosthetic fracture.  She underwent a simultaneous repair of the right side with an open reduction and internal fixation using plate and screws.  The periprosthetic fracture on the left proximal tibia was proximal enough  to the joint surface and around the prosthesis that I felt that it would be a challenge to place a plate that would allow for enough fixation.  Due to her size, limited mobility and the bilateral nature of her fracture, I chose to use an external fixator device as opposed to a cast.  She subsequently was placed into this and has been in it for the past 2+ months.  We have been following her in our office trying to identify fracture union and determine timing to remove her hardware.  She presented recently after a low energy sliding fall with increase in pain in the legs.  Radiographs had revealed slight concern for extension of the fracture and questionable union.  At this point, I went ahead and I spoke to Dr. Doreen Beam about the possibility of assuming care for her once removed the external fixator for potential plate fixation of the proximal tibial fracture.  The plan was outlined to remove the external fixator, give her a holiday from the pin tracks for 3-4 weeks and determine where to go from there.  Risks of potential nonunion, need for future surgery, and infection were all discussed, reviewed.  Consent was obtained  for the benefit of pain relief as well as management of the fracture.  There was always the hope that when the external fixator was removed that the fracture would be healed enough that she would not need a future surgery.  PROCEDURE IN DETAIL:  The patient was brought to operative theater. Once adequate anesthesia was established, preoperative antibiotics, clindamycin administered, she was positioned supine.  The left leg was prepped from a draped out perineum to the ankle and external fixator device was sprayed with Betadine.  Time-out was performed identifying the patient, planned procedure, and extremity.  The external fixator was removed without difficulty or complication, again noting that the pins within the bone were relatively loose but still had to  be extracted using a screwdriver.  Once the pins were removed, I did basically make a longitudinal incision incorporating both pin sites on the thigh as well as the legs, debrided sharply the skin and subcutaneous tissue, and then irrigated each wound with 1 L normal saline solution pulse lavage.  The wounds were then reapproximated using 2-0 nylon suture.  At this point, fluoroscopy was brought into the field.  I evaluated the fracture site as best I could, both with static and fluoro imaging in order to determine if I could see any movement.  I did not feel I saw any movement but there was significant difference in the appearance of the fracture with rotation making it a little bit difficult to truly judge this.  Following this assessment, the thigh and leg were cleaned with saline and dried.  The wounds were each dressed with Xeroform, bulky and sterile gauze, ABD and a sterile wrap.  I then selected to place her knee immobilizer for support of this proximal tibia.  She was then extubated and brought to the recovery room in stable condition.  We will admit her to the hospital and continue to have her be nonweightbearing until further directed.  I will probably order a CT scan of the left proximal tibia while she is here to see if we can see any signs of healing, albeit somewhat difficult I am certain with the metal in place.  I reviewed findings with her daughter.     Madlyn Frankel Charlann Boxer, M.D.     MDO/MEDQ  D:  11/30/2012  T:  12/01/2012  Job:  [id number]

## 2012-12-01 NOTE — Progress Notes (Signed)
OT Cancellation Note  Patient Details Name: Gina Ashley MRN: [id number] DOB: [birth year]   Cancelled Treatment:    Reason Eval/Treat Not Completed: Other (comment) (screen:  defer to snf:  Pt was at snf prior to admission)  [name] 12/01/2012, 12:09 PM Marica Otter, OTR/L [phone] 12/01/2012

## 2012-12-01 NOTE — Progress Notes (Signed)
Clinical Social Work Department CLINICAL SOCIAL WORK PLACEMENT NOTE 12/01/2012  Patient:  Gina Ashley, Gina Ashley  Account Number:  [id number] Admit date:  11/30/2012  Clinical Social Worker:  Cori Razor, LCSW  Date/time:  12/01/2012 01:07 PM  Clinical Social Work is seeking post-discharge placement for this patient at the following level of care:   SKILLED NURSING   (*CSW will update this form in Epic as items are completed)     Patient/family provided with [facility] Department of Clinical Social Work's list of facilities offering this level of care within the geographic area requested by the patient (or if unable, by the patient's family).    Patient/family informed of their freedom to choose among providers that offer the needed level of care, that participate in Medicare, Medicaid or managed care program needed by the patient, have an available bed and are willing to accept the patient.    Patient/family informed of [facility]' ownership interest in [facility], as well as of the fact that they are under no obligation to receive care at this facility.  PASARR submitted to EDS on  PASARR number received from EDS on 08/06/2012  FL2 transmitted to all facilities in geographic area requested by pt/family on  12/01/2012 FL2 transmitted to all facilities within larger geographic area on   Patient informed that his/her managed care company has contracts with or will negotiate with  certain facilities, including the following:     Patient/family informed of bed offers received:  12/01/2012 Patient chooses bed at [facility] Physician recommends and patient chooses bed at    Patient to be transferred to  on   Patient to be transferred to facility by   The following physician request were entered in Epic:   Additional Comments:   Cori Razor LCSW [phone]

## 2012-12-01 NOTE — Progress Notes (Signed)
Clinical Social Work Department BRIEF PSYCHOSOCIAL ASSESSMENT 12/01/2012  Patient:  [facility]     Account Number:  [id number]     Admit date:  11/30/2012  Clinical Social Worker:  Candie Chroman  Date/Time:  12/01/2012 12:58 PM  Referred by:  RN  Date Referred:  12/01/2012 Referred for  SNF Placement   Other Referral:   Interview type:  Family Other interview type:    PSYCHOSOCIAL DATA Living Status:  FACILITY Admitted from facility:   Level of care:  Skilled Nursing Facility Primary support name:  Fredirick Maudlin Primary support relationship to patient:  CHILD, ADULT Degree of support available:   supportive    CURRENT CONCERNS Current Concerns  Post-Acute Placement   Other Concerns:    SOCIAL WORK ASSESSMENT / PLAN Pt is an 79 yr old female admitted from [facility] (subacute unit ). CSW spoke with pt's daughter to assist with d/c planning. Pt plans to return to [facility] to continue with her rehab following hospital d/c. [facility] has been contacted and d/c plan has been confirmed. CSW will followto assist with d/c planning needs.   Assessment/plan status:  Psychosocial Support/Ongoing Assessment of Needs Other assessment/ plan:   Information/referral to community resources:   None needed at this time.    PATIENT'S/FAMILY'S RESPONSE TO PLAN OF CARE: Daughter states that pt was progress well with her rehab and would like to return to [facility] to continue with therapy.   Cori Razor LCSW [phone]

## 2012-12-01 NOTE — Progress Notes (Signed)
ANTICOAGULATION CONSULT NOTE - Follow Up Consult  Pharmacy Consult for Warfarin Indication: atrial fibrillation and VTE prophylaxis  Allergies  Allergen Reactions  . Morphine Sulfate Other (See Comments)    REACTION: change in personality  . Remeron (Mirtazapine) Other (See Comments)    Altered mental status, lethargy  . Tuna (Fish Allergy) Other (See Comments)    unknown  . Penicillins Rash    Tolerates Ancef.    Patient Measurements: Height: 5\' 3"  (160 cm) Weight: 218 lb (98.884 kg) IBW/kg (Calculated) : 52.4   Vital Signs: Temp: 98 F (36.7 C) (01/28 1006) Temp src: Oral (01/28 1006) BP: 105/67 mmHg (01/28 1006) Pulse Rate: 100  (01/28 1006)  Labs:  Basename 12/01/12 0425 11/30/12 0923 11/30/12 0900  HGB 10.6* -- 11.5*  HCT 34.8* -- 37.0  PLT 147* -- PLATELET CLUMPS NOTED ON SMEAR, UNABLE TO ESTIMATE  APTT -- -- 24  LABPROT 19.1* -- 17.5*  INR 1.66* -- 1.48  HEPARINUNFRC -- -- --  CREATININE 1.66* 1.70* --  CKTOTAL -- -- --  CKMB -- -- --  TROPONINI -- -- --    Estimated Creatinine Clearance: 28.8 ml/min (by C-G formula based on Cr of 1.66).   Medications:  Scheduled:    . allopurinol  100 mg Oral Daily  . amiodarone  200 mg Oral QAC breakfast  . carvedilol  25 mg Oral BID WC  . celecoxib  200 mg Oral Q12H  . [COMPLETED] clindamycin (CLEOCIN) IV  600 mg Intravenous Q6H  . [COMPLETED] clindamycin (CLEOCIN) IV  900 mg Intravenous On Call to OR  . docusate sodium  100 mg Oral BID  . ferrous sulfate  325 mg Oral TID PC  . guaiFENesin  600 mg Oral BID  . hydrALAZINE  25 mg Oral TID  . HYDROcodone-acetaminophen  1-2 tablet Oral Q4H  . insulin aspart  0-20 Units Subcutaneous TID WC  . insulin glargine  5 Units Subcutaneous QHS  . isosorbide dinitrate  20 mg Oral TID  . OxyCODONE  10 mg Oral Q12H  . pantoprazole  40 mg Oral Daily  . polyethylene glycol  17 g Oral BID  . sevelamer carbonate  800 mg Oral TID WC  . traZODone  50 mg Oral QHS  . [COMPLETED]  warfarin  2.5 mg Oral Once  . Warfarin - Pharmacist Dosing Inpatient   Does not apply q1800  . [DISCONTINUED] chlorhexidine  60 mL Topical Once  . [DISCONTINUED] mupirocin ointment   Nasal BID  . [DISCONTINUED] oxyCODONE  10 mg Oral Q12H  . [DISCONTINUED] warfarin  2.5 mg Oral ONCE-1800   Infusions:    . sodium chloride 0.9 % 1,000 mL with potassium chloride 10 mEq infusion 100 mL/hr at 11/30/12 1633  . [DISCONTINUED] sodium chloride 1,000 mL (11/30/12 1108)    Assessment:  79 yo F with hx of Afib (on warfarin prior to admission) admitted 1/27 for leg external fixation  Home warfarin dose reported as 2.5mg  daily  Of note, was also receiving 100 mcg Vit K PO daily at the SNF prior to admission, not ordered inpatient.  Pt may require lower warfarin dosing in hospital since the patient is not receiving PO Vit K daily in hospital like at SNF.   Drug interaction with amiodarone and allopurinol noted, although these are usual home meds.  INR subtherpaeutic as expected, but rising after resuming 2.5mg  warfarin 1/27 pm  CBC ok, no bleeding/complications reported.  Goal of Therapy:  INR 2-3   Plan:  Warfarin 1mg  po x 1 today  Daily PT/INR  Loralee Pacas, PharmD, BCPS Pager: [phone] 12/01/2012,10:40 AM

## 2012-12-02 LAB — BASIC METABOLIC PANEL
GFR calc non Af Amer: 23 mL/min — ABNORMAL LOW (ref >90)
Glucose, Bld: 125 mg/dL — ABNORMAL HIGH (ref 70–99)
Potassium: 3.8 mEq/L (ref 3.5–5.1)
Sodium: 130 mEq/L — ABNORMAL LOW (ref 135–145)

## 2012-12-02 LAB — CBC
Hemoglobin: 10.7 g/dL — ABNORMAL LOW (ref 12.0–15.0)
MCHC: 30.2 g/dL (ref 30.0–36.0)
Platelets: 134 10*3/uL — ABNORMAL LOW (ref 150–400)
RBC: 4.04 MIL/uL (ref 3.87–5.11)

## 2012-12-02 LAB — PROTIME-INR
INR: 1.98 — ABNORMAL HIGH (ref 0.00–1.49)
Prothrombin Time: 21.7 seconds — ABNORMAL HIGH (ref 11.6–15.2)

## 2012-12-02 LAB — GLUCOSE, CAPILLARY
Glucose-Capillary: 126 mg/dL — ABNORMAL HIGH (ref 70–99)
Glucose-Capillary: 99 mg/dL (ref 70–99)

## 2012-12-02 MED ORDER — WARFARIN SODIUM 1 MG PO TABS
1.0000 mg | ORAL_TABLET | Freq: Once | ORAL | Status: AC
  Administered 2012-12-02: 1 mg via ORAL
  Filled 2012-12-02: qty 1

## 2012-12-02 NOTE — Progress Notes (Signed)
Physical Therapy Treatment Patient Details Name: Gina Ashley MRN: [id number] DOB: Mar 10, 1927 Today's Date: 12/02/2012 Time: 4098-1191 PT Time Calculation (min): 31 min  PT Assessment / Plan / Recommendation Comments on Treatment Session  Worked on sitting balance and initiated static standing at EOB. Pt fatigues fairly easily. Recommend continued rehab at [facility].     Follow Up Recommendations  SNF     Does the patient have the potential to tolerate intense rehabilitation     Barriers to Discharge        Equipment Recommendations  None recommended by PT    Recommendations for Other Services    Frequency Min 3X/week   Plan Discharge plan remains appropriate    Precautions / Restrictions Precautions Required Braces or Orthoses: Knee Immobilizer - Left Knee Immobilizer - Left: On at all times Restrictions Weight Bearing Restrictions: Yes LLE Weight Bearing: Non weight bearing   Pertinent Vitals/Pain R knee, L LE 5/10    Mobility  Bed Mobility Bed Mobility: Supine to Sit Supine to Sit: 1: +2 Total assist Supine to Sit: Patient Percentage: 40% Details for Bed Mobility Assistance: VCS safety, technique, hand placement. assist needed for bil LEs and trunk to upright. Utilized bedpad to assist with scooting, positioning. Increased time. Transfers Transfers: Sit to Stand;Stand to Sit Sit to Stand: 1: +2 Total assist Sit to Stand: Patient Percentage: 20% Stand to Sit: 1: +2 Total assist Stand to Sit: Patient Percentage: 20% Details for Transfer Assistance: x2 attempts at sit to stand. Pt able to achieve ~25% of full stand for 4-5 seconds. Highly elevated surface. difficulty rising due to decreased ROM R knee in sitting.     Exercises General Exercises - Upper Extremity Shoulder Flexion: AROM;Theraband;5 reps Shoulder Horizontal ABduction: AROM;Theraband;5 reps Elbow Flexion: AROM;5 reps;Theraband   PT Diagnosis:    PT Problem List:   PT Treatment Interventions:     PT  Goals Acute Rehab PT Goals Pt will go Supine/Side to Sit: with max assist PT Goal: Supine/Side to Sit - Progress: Progressing toward goal Pt will go Sit to Supine/Side: with max assist PT Goal: Sit to Supine/Side - Progress: Progressing toward goal Pt will go Sit to Stand: with +2 total assist PT Goal: Sit to Stand - Progress: Progressing toward goal Pt will Stand: with +2 total assist;1 - 2 min;with bilateral upper extremity support PT Goal: Stand - Progress: Progressing toward goal  Visit Information  Last PT Received On: 12/02/12 Assistance Needed: +2    Subjective Data  Subjective: "I'm getting closer to the door!" Patient Stated Goal: Back to rehab.    Cognition  Overall Cognitive Status: Appears within functional limits for tasks assessed/performed Arousal/Alertness: Awake/alert Orientation Level: Appears intact for tasks assessed Behavior During Session: [facility] for tasks performed    Balance  Static Sitting Balance Static Sitting - Balance Support: Bilateral upper extremity supported;Feet supported Static Sitting - Level of Assistance: 4: Min assist;5: Stand by assistance Dynamic Sitting Balance Dynamic Sitting - Comments: Sat EOB 7-8 minutes while performing UE exercises.   End of Session PT - End of Session Activity Tolerance: Patient limited by pain;Patient limited by fatigue Patient left: in bed;with call bell/phone within reach Nurse Communication: Need for lift equipment   GP     [name] Alert [facility] 12/02/2012, 4:57 PM [phone]

## 2012-12-02 NOTE — Progress Notes (Signed)
ANTICOAGULATION CONSULT NOTE - Follow Up Consult  Pharmacy Consult for Warfarin Indication: atrial fibrillation and VTE prophylaxis  Allergies  Allergen Reactions  . Morphine Sulfate Other (See Comments)    REACTION: change in personality  . Remeron (Mirtazapine) Other (See Comments)    Altered mental status, lethargy  . Tuna (Fish Allergy) Other (See Comments)    unknown  . Penicillins Rash    Tolerates Ancef.    Patient Measurements: Height: 5\' 3"  (160 cm) Weight: 218 lb (98.884 kg) IBW/kg (Calculated) : 52.4   Vital Signs: Temp: 98.2 F (36.8 C) (01/29 0459) Temp src: Oral (01/29 0459) BP: 113/74 mmHg (01/29 0459) Pulse Rate: 94  (01/29 0459)  Labs:  Basename 12/02/12 0430 12/01/12 0425 11/30/12 0923 11/30/12 0900  HGB 10.7* 10.6* -- --  HCT 35.4* 34.8* -- 37.0  PLT 134* 147* -- PLATELET CLUMPS NOTED ON SMEAR, UNABLE TO ESTIMATE  APTT -- -- -- 24  LABPROT 21.7* 19.1* -- 17.5*  INR 1.98* 1.66* -- 1.48  HEPARINUNFRC -- -- -- --  CREATININE 1.92* 1.66* 1.70* --  CKTOTAL -- -- -- --  CKMB -- -- -- --  TROPONINI -- -- -- --    Estimated Creatinine Clearance: 24.9 ml/min (by C-G formula based on Cr of 1.92).   Medications:  Scheduled:     . allopurinol  100 mg Oral Daily  . amiodarone  200 mg Oral QAC breakfast  . carvedilol  25 mg Oral BID WC  . celecoxib  200 mg Oral Q12H  . docusate sodium  100 mg Oral BID  . enoxaparin (LOVENOX) injection  30 mg Subcutaneous Q24H  . ferrous sulfate  325 mg Oral TID PC  . guaiFENesin  600 mg Oral BID  . heparin  10,000 Units Intravenous Once  . hydrALAZINE  25 mg Oral TID  . HYDROcodone-acetaminophen  1-2 tablet Oral Q4H  . insulin aspart  0-20 Units Subcutaneous TID WC  . insulin glargine  5 Units Subcutaneous QHS  . isosorbide dinitrate  20 mg Oral TID  . OxyCODONE  10 mg Oral Q12H  . pantoprazole  40 mg Oral Daily  . polyethylene glycol  17 g Oral BID  . sevelamer carbonate  800 mg Oral TID WC  . traZODone  50  mg Oral QHS  . [COMPLETED] warfarin  1 mg Oral ONCE-1800  . Warfarin - Pharmacist Dosing Inpatient   Does not apply q1800   Infusions:     . sodium chloride 0.9 % 1,000 mL with potassium chloride 10 mEq infusion 100 mL/hr at 11/30/12 1633    Assessment:  79 yo F with hx of Afib (on warfarin prior to admission) admitted 1/27 for leg external fixation  Home warfarin dose reported as 2.5mg  daily  Of note, was also receiving 100 mcg Vit K PO daily at the SNF prior to admission, not ordered inpatient.  Pt may require lower warfarin dosing in hospital since the patient is not receiving PO Vit K daily in hospital like at SNF.   Drug interaction with amiodarone and allopurinol noted, although these are usual home meds.  INR = 1.98. Trending up, dose reduced from normal home dose last night.    CBC: Hgb decreased but stable, plts = 134  Patient on celebrex - may inc risk of bleeding with warfarin  Goal of Therapy:  INR 2-3   Plan:   Warfarin 1mg  po x 1 today, using lower dose than prior to admission regimen d/t INR trend as  well as patient taking low-dose VitK daily with warfarin.   Daily PT/INR  D/C enoxaparin when INR>=2.   Juliette Alcide, PharmD, BCPS.   Pager: [phone] 12/02/2012,11:12 AM

## 2012-12-02 NOTE — Progress Notes (Signed)
Patient ID: Dynesha Ashley, female   DOB: 1927/08/30, 79 y.o.   MRN: [id number] Subjective: 2 Days Post-Op Procedure(s) (LRB): EXTERNAL FIXATION LEG (Left)    Patient reports pain as mild. While stable.  Was moved to chair yesterday but by lift ( need to change this in long run if we are to make progress)  Objective:   VITALS:   Filed Vitals:   12/02/12 0459  BP: 113/74  Pulse: 94  Temp: 98.2 F (36.8 C)  Resp: 20    Neurovascular intact Incision: scant drainage, bloody No signs of infection  LABS  Basename 12/02/12 0430 12/01/12 0425 11/30/12 0900  HGB 10.7* 10.6* 11.5*  HCT 35.4* 34.8* 37.0  WBC 6.1 6.3 6.5  PLT 134* 147* PLATELET CLUMPS NOTED ON SMEAR, UNABLE TO ESTIMATE     Basename 12/02/12 0430 12/01/12 0425 11/30/12 0923  NA 130* 129* 131*  K 3.8 3.4* 3.5  BUN 44* 42* 43*  CREATININE 1.92* 1.66* 1.70*  GLUCOSE 125* 105* 118*     Basename 12/02/12 0430 12/01/12 0425  LABPT -- --  INR 1.98* 1.66*     Assessment/Plan: 2 Days Post-Op Procedure(s) (LRB): EXTERNAL FIXATION LEG (Left)   Up with therapy Plan for discharge tomorrow Discharge to SNF, return to [facility] with PT recs as initiated here

## 2012-12-03 DIAGNOSIS — D500 Iron deficiency anemia secondary to blood loss (chronic): Secondary | ICD-10-CM

## 2012-12-03 LAB — GLUCOSE, CAPILLARY: Glucose-Capillary: 111 mg/dL — ABNORMAL HIGH (ref 70–99)

## 2012-12-03 LAB — PROTIME-INR: INR: 2 — ABNORMAL HIGH (ref 0.00–1.49)

## 2012-12-03 MED ORDER — METHOCARBAMOL 500 MG PO TABS: 500.0000 mg | ORAL_TABLET | Freq: Four times a day (QID) | ORAL | Status: DC | PRN

## 2012-12-03 MED ORDER — HYDROCODONE-ACETAMINOPHEN 7.5-325 MG PO TABS: 1.0000 | ORAL_TABLET | ORAL | Status: DC | PRN

## 2012-12-03 MED ORDER — WARFARIN SODIUM 2 MG PO TABS: 1.0000 mg | ORAL_TABLET | Freq: Every day | ORAL | Status: DC

## 2012-12-03 NOTE — Progress Notes (Signed)
Clinical Social Work Department CLINICAL SOCIAL WORK PLACEMENT NOTE 12/03/2012  Patient:  Gina Ashley, Gina Ashley  Account Number:  [id number] Admit date:  11/30/2012  Clinical Social Worker:  Cori Razor, LCSW  Date/time:  12/01/2012 01:07 PM  Clinical Social Work is seeking post-discharge placement for this patient at the following level of care:   SKILLED NURSING   (*CSW will update this form in Epic as items are completed)     Patient/family provided with [facility] Department of Clinical Social Work's list of facilities offering this level of care within the geographic area requested by the patient (or if unable, by the patient's family).    Patient/family informed of their freedom to choose among providers that offer the needed level of care, that participate in Medicare, Medicaid or managed care program needed by the patient, have an available bed and are willing to accept the patient.    Patient/family informed of [facility]' ownership interest in [facility], as well as of the fact that they are under no obligation to receive care at this facility.  PASARR submitted to EDS on  PASARR number received from EDS on 08/06/2012  FL2 transmitted to all facilities in geographic area requested by pt/family on  12/01/2012 FL2 transmitted to all facilities within larger geographic area on   Patient informed that his/her managed care company has contracts with or will negotiate with  certain facilities, including the following:     Patient/family informed of bed offers received:  12/01/2012 Patient chooses bed at [facility] Physician recommends and patient chooses bed at    Patient to be transferred to [id number]  on  12/03/2012 Patient to be transferred to facility by [facility]  The following physician request were entered in Epic:   Additional Comments:  Cori Razor LCSW [phone]

## 2012-12-03 NOTE — Progress Notes (Signed)
   Subjective: 3 Days Post-Op Procedure(s) (LRB): EXTERNAL FIXATION LEG (Left)   Patient reports pain as mild, pain is well controlled. States that her pain is a lot better now. No events throughout the night.   Objective:   VITALS:   Filed Vitals:   12/03/12 0635  BP: 115/77  Pulse: 94  Temp: 98.9 F (37.2 C)  Resp: 20    Neurovascular intact Dorsiflexion/Plantar flexion intact Incision: dressing C/D/I No cellulitis present Compartment soft  LABS  Basename 12/02/12 0430 12/01/12 0425 11/30/12 0900  HGB 10.7* 10.6* 11.5*  HCT 35.4* 34.8* 37.0  WBC 6.1 6.3 6.5  PLT 134* 147* PLATELET CLUMPS NOTED ON SMEAR, UNABLE TO ESTIMATE     Basename 12/02/12 0430 12/01/12 0425 11/30/12 0923  NA 130* 129* 131*  K 3.8 3.4* 3.5  BUN 44* 42* 43*  CREATININE 1.92* 1.66* 1.70*  GLUCOSE 125* 105* 118*     Assessment/Plan: 3 Days Post-Op Procedure(s) (LRB): EXTERNAL FIXATION LEG (Left) Up with therapy Discharge to SNF today Follow up in 2 weeks at [facility]. Follow up with OLIN,Alger Kerstein D in 2 weeks.  Contact information:  [facility] [address], [address] [place] [address] [phone]    Expected ABLA  Treated with iron and will observe  Obese (BMI 30-39.9) Estimated Body mass index is 38.62 kg/(m^2) as calculated from the following:   Height as of this encounter: 5\' 3" (1.6 m).   Weight as of this encounter: 218 lb(98.884 kg). Patient also counseled that weight may inhibit the healing process Patient counseled that losing weight will help with future health issues     Anastasio Auerbach. Kristyne Woodring   PAC  12/03/2012, 8:08 AM

## 2012-12-03 NOTE — Progress Notes (Signed)
ANTICOAGULATION CONSULT NOTE - Follow Up Consult  Pharmacy Consult for Warfarin Indication: atrial fibrillation and VTE prophylaxis  Allergies  Allergen Reactions  . Morphine Sulfate Other (See Comments)    REACTION: change in personality  . Remeron (Mirtazapine) Other (See Comments)    Altered mental status, lethargy  . Tuna (Fish Allergy) Other (See Comments)    unknown  . Penicillins Rash    Tolerates Ancef.    Patient Measurements: Height: 5\' 3"  (160 cm) Weight: 218 lb (98.884 kg) IBW/kg (Calculated) : 52.4   Vital Signs: Temp: 98.9 F (37.2 C) (01/30 0635) Temp src: Oral (01/30 0635) BP: 110/67 mmHg (01/30 0910) Pulse Rate: 94  (01/30 0635)  Labs:  Basename 12/03/12 0430 12/02/12 0430 12/01/12 0425  HGB -- 10.7* 10.6*  HCT -- 35.4* 34.8*  PLT -- 134* 147*  APTT -- -- --  LABPROT 21.9* 21.7* 19.1*  INR 2.00* 1.98* 1.66*  HEPARINUNFRC -- -- --  CREATININE -- 1.92* 1.66*  CKTOTAL -- -- --  CKMB -- -- --  TROPONINI -- -- --    Estimated Creatinine Clearance: 24.9 ml/min (by C-G formula based on Cr of 1.92).   Medications:  Scheduled:     . allopurinol  100 mg Oral Daily  . amiodarone  200 mg Oral QAC breakfast  . carvedilol  25 mg Oral BID WC  . celecoxib  200 mg Oral Q12H  . docusate sodium  100 mg Oral BID  . enoxaparin (LOVENOX) injection  30 mg Subcutaneous Q24H  . ferrous sulfate  325 mg Oral TID PC  . guaiFENesin  600 mg Oral BID  . heparin  10,000 Units Intravenous Once  . hydrALAZINE  25 mg Oral TID  . HYDROcodone-acetaminophen  1-2 tablet Oral Q4H  . insulin aspart  0-20 Units Subcutaneous TID WC  . insulin glargine  5 Units Subcutaneous QHS  . isosorbide dinitrate  20 mg Oral TID  . OxyCODONE  10 mg Oral Q12H  . pantoprazole  40 mg Oral Daily  . polyethylene glycol  17 g Oral BID  . sevelamer carbonate  800 mg Oral TID WC  . traZODone  50 mg Oral QHS  . [COMPLETED] warfarin  1 mg Oral ONCE-1800  . Warfarin - Pharmacist Dosing  Inpatient   Does not apply q1800   Infusions:     . sodium chloride 0.9 % 1,000 mL with potassium chloride 10 mEq infusion 20 mL/hr at 12/02/12 0827    Assessment:  79 yo F with hx of Afib (on warfarin prior to admission) admitted 1/27 for leg external fixation  Home warfarin dose reported as 2.5mg  daily  Of note, was also receiving 100 mcg Vit K PO daily at the SNF prior to admission, not ordered inpatient.  Pt may require lower warfarin dosing in hospital since the patient is not receiving PO Vit K daily in hospital like at SNF.   Drug interaction with amiodarone and allopurinol noted, although these are usual home meds.  INR = 2. requring lower dose than prior to admission  CBC: Hgb decreased but stable, plts = 134  Patient on celebrex - may inc risk of bleeding with warfarin  Goal of Therapy:  INR 2-3   Plan:   Orders to be discharged today, orders to take warfarin 1mg  at discharge, she has required lower doses in hospital. This may be related to patient taking low dose vit K at home. So, once this is resumed she may need to increase dose  to normal 2.5mg  daily.   Daily PT/INR  Enoxaparin not to continue at discharge  Juliette Alcide, PharmD, BCPS.   Pager: [phone] 12/03/2012,11:59 AM

## 2012-12-03 NOTE — Discharge Summary (Signed)
Physician Discharge Summary  Patient ID: Gina Ashley MRN: [id number] DOB/AGE: 1927/04/22 79 y.o.  Admit date: 11/30/2012 Discharge date:  12/03/2012  Procedures:  Procedure(s) (LRB): EXTERNAL FIXATION LEG (Left)  Attending Physician:  Dr. 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 GERD (gastroesophageal reflux disease)  . Back pain, chronic  . Pain in shoulder  . Pain in ankle  . Hiatal hernia  . HTN (hypertension)  . CKD (chronic kidney disease), stage III - GFR: 38  . Atrial fibrillation  . Sleep apnea  . Fall  . OSA on CPAP  . Depression  . PONV (postoperative nausea and vomiting)  . Chronic diastolic heart failure  . Tibia fracture  . Renal failure  . Fibula fracture  . Respiratory failure - acute on chronic hx of  requiring BIPAP  . COPD (chronic obstructive pulmonary disease)  . Metabolic encephalopathy - hx of  . Renal failure - acute on chronic with need for intermittent dialysis - hx of   . UTI (urinary tract infection)- hx of  . Pleural effusion  . Diabetes mellitus  . CHF (congestive heart failure)    HPI: Pt is a 79 y.o. female known to me from fall in October 2013. At that time we fixed her right distal femur with ORIF with plate. Her left proximal tibia was deemed to be too close to the prosthetic component so I decided to place her in an external fixator. She recently had repeat fall with x-rays that raised concern of extension of the fracture. I reviewed the case and situation with Dr. Myrene Galas and came to a plan that includes removing the external fixator, cleaning pin sites and giving her a "holiday" from pin tracts. She will be assessed in the OR under fluoro to check for fracture healing. If the fracture doesn't  appear to be healing then she will be referred to Dr. Carola Frost for definitive treatment discussion.  PCP: Ernestine Conrad, MD   Discharged Condition: good  Hospital Course:  Patient underwent the above stated procedure on 11/30/2012. Patient tolerated the procedure well and brought to the recovery room in good condition and subsequently to the floor.  POD #1 BP: 117/77 ; Pulse: 91 ; Temp: 98 F (36.7 C) ; Resp: 16  Pt's foley was removed, as well as the hemovac drain removed. IV was changed to a saline lock. Patient reports pain as mild, pain well controlled. No events throughout the night.  Neurovascular intact, dorsiflexion/plantar flexion intact, incision: dressing C/D/I, no cellulitis present and compartment soft.   LABS  Basename  12/01/12 0425   HGB  10.6  HCT  34.8   POD #2  BP: 113/74 ; Pulse: 94 ; Temp: 98.2 F (36.8 C) ; Resp: 20  Patient reports pain as mild. While stable. Was moved to chair yesterday but by lift ( need to change this in long run if we are to make progress) Neurovascular intact, incision: scant drainage, bloody and no signs of infection.  LABS  Basename  12/02/12 0430   HGB  10.7  HCT  35.4   POD #3  BP: 115/77 ; Pulse: 94 ; Temp: 98.9 F (37.2 C) ; Resp: 20  Patient reports pain as mild, pain is well controlled. States that her pain is a  lot better now. No events throughout the night.  Neurovascular intact, dorsiflexion/plantar flexion intact, incision: scant drainage, no cellulitis present and compartment soft.   LABS   No new labs  Discharge Exam: General appearance: alert, cooperative and no distress Extremities: Homans sign is negative, no sign of DVT, no edema, redness or tenderness in the calves or thighs and no ulcers, gangrene or trophic changes  Disposition:   Home-Health Care Svc with follow up in 2 weeks   Follow-up Information    Follow up with Shelda Pal, MD. Schedule an appointment as soon as possible for a visit in 2 weeks.    Contact information:   [address] [place] [address] [phone]          Discharge Orders    Future Orders Please Complete By Expires   Diet - low sodium heart healthy      Call MD / Call 911      Comments:   If you experience chest pain or shortness of breath, CALL 911 and be transported to the hospital emergency room.  If you develope a fever above 101 F, pus (white drainage) or increased drainage or redness at the wound, or calf pain, call your surgeon's office.   Discharge instructions      Comments:   Daily dressing changes with 4x4 gauze and tape. Keep the area dry and clean until follow up. Follow up in 2 weeks at [facility]. Call with any questions or concerns.   Constipation Prevention      Comments:   Drink plenty of fluids.  Prune juice may be helpful.  You may use a stool softener, such as Colace (over the counter) 100 mg twice a day.  Use MiraLax (over the counter) for constipation as needed.   Non weight bearing      Comments:   Non weight bearing on the left leg. Knee immobilizer at all time, except to clean the leg. Pivot transfers on the right leg.      Current Discharge Medication List    START taking these medications   Details  HYDROcodone-acetaminophen (NORCO) 7.5-325 MG per tablet Take 1-2 tablets by mouth every 4 (four) hours as needed for pain. Qty: 120 tablet, Refills: 0    methocarbamol (ROBAXIN) 500 MG tablet Take 1 tablet (500 mg total) by mouth every 6 (six) hours as needed (muscle spasms). Qty: 50 tablet, Refills: 0      CONTINUE these medications which have CHANGED   Details  warfarin (COUMADIN) 2 MG tablet Take 0.5 tablets (1 mg total) by mouth daily before breakfast.   Comments: Daily INR to monitor levels and dosing adjusted by pharmacy to obtain and maintain therapeutic INR of 2-3. Last dose in the hospital was 1 mg. Lovenox already discontinued do to INR already being 2.00 on 12/03/2012.      CONTINUE  these medications which have NOT CHANGED   Details  allopurinol (ZYLOPRIM) 100 MG tablet Take 100 mg by mouth daily.    amiodarone (PACERONE) 200 MG tablet Take 200 mg by mouth daily before breakfast.     carvedilol (COREG) 25 MG tablet Take 25 mg by mouth 2 (two) times daily with a meal.    docusate sodium (COLACE) 100 MG capsule Take 100 mg by mouth 2 (two) times daily.    guaiFENesin (MUCINEX) 600 MG 12 hr tablet Take 600 mg by mouth 2 (two) times daily.     hydrALAZINE (APRESOLINE) 25 MG tablet Take 25  mg by mouth 3 (three) times daily.    insulin glargine (LANTUS) 100 UNIT/ML injection Inject 5 Units into the skin at bedtime.    isosorbide dinitrate (ISORDIL) 20 MG tablet Take 20 mg by mouth 3 (three) times daily.    oxyCODONE (OXYCONTIN) 10 MG 12 hr tablet Take 10 mg by mouth every 12 (twelve) hours.    Oxycodone HCl 10 MG TABS Take 1 mg by mouth every 4 (four) hours as needed. Pain    pantoprazole (PROTONIX) 40 MG tablet Take 40 mg by mouth daily.    polyethylene glycol (MIRALAX / GLYCOLAX) packet Take 17 g by mouth 2 (two) times daily.     sevelamer carbonate (RENVELA) 800 MG tablet Take 800 mg by mouth 3 (three) times daily with meals.    traZODone (DESYREL) 50 MG tablet Take 50 mg by mouth at bedtime.    bisacodyl (DULCOLAX) 10 MG suppository Place 1 suppository (10 mg total) rectally daily as needed. Qty: 12 suppository    darbepoetin (ARANESP) 150 MCG/0.3ML SOLN Inject 125 mcg into the skin every 7 (seven) days.    HYDRALAZINE HCL IJ Inject 10 mg as directed every 4 (four) hours as needed. heartbeat    insulin aspart (NOVOLOG) 100 UNIT/ML injection Inject 1-5 Units into the skin 3 (three) times daily with meals. 150-200=1unit,201-250=2units,251-300=3units,301-350=4units,351-400=5units if over 400 call MD    lactulose (CHRONULAC) 10 GM/15ML solution Take 20 g by mouth daily as needed. Constipation    miconazole (MICOTIN) 2 % powder Apply 1 application topically as  needed. rash    nitroGLYCERIN (NITROSTAT) 0.4 MG SL tablet Place 0.4 mg under the tongue every 5 (five) minutes as needed. Chest pain    ONDANSETRON HCL IJ Inject 4 mg into the vein every 6 (six) hours as needed. Iv push every 6 hours for nausea    sodium chloride (OCEAN) 0.65 % nasal spray Place 1 spray into the nose every 6 (six) hours as needed. Dry nares    sodium chloride 0.9 % SOLN 100 mL with iron sucrose 20 MG/ML SOLN Inject 100 mg into the vein as directed. 3 times per week    Vitamin D, Ergocalciferol, (DRISDOL) 50000 UNITS CAPS Take 50,000 Units by mouth every 7 (seven) days.    vitamin k 100 MCG tablet Take 100 mcg by mouth daily.    Water For Irrigation, Sterile (STERILE WATER FOR IRRIGATION) Irrigate with as directed once.      STOP taking these medications     doxycycline (VIBRAMYCIN) 100 MG capsule Comments:  Reason for Stopping:       HYDROcodone-acetaminophen (NORCO/VICODIN) 5-325 MG per tablet Comments:  Reason for Stopping:           Signed: Anastasio Auerbach. Rayla Pember   PAC  12/03/2012, 8:25 AM

## 2012-12-04 LAB — TYPE AND SCREEN
Antibody Screen: POSITIVE
DAT, IgG: NEGATIVE
Donor AG Type: NEGATIVE
Unit division: 0

## 2013-01-02 ENCOUNTER — Inpatient Hospital Stay (HOSPITAL_COMMUNITY)
Admission: EM | Admit: 2013-01-02 | Discharge: 2013-01-23 | DRG: 291 | Disposition: A | Discharge status: Skilled Nursing Facility | Payer: Medicare Other | Attending: Internal Medicine | Admitting: Internal Medicine

## 2013-01-02 ENCOUNTER — Emergency Department (HOSPITAL_COMMUNITY): Payer: Medicare Other

## 2013-01-02 ENCOUNTER — Encounter (HOSPITAL_COMMUNITY): Payer: Self-pay | Admitting: *Deleted

## 2013-01-02 DIAGNOSIS — D638 Anemia in other chronic diseases classified elsewhere: Secondary | ICD-10-CM | POA: Diagnosis present

## 2013-01-02 DIAGNOSIS — E876 Hypokalemia: Secondary | ICD-10-CM | POA: Diagnosis present

## 2013-01-02 DIAGNOSIS — J9611 Chronic respiratory failure with hypoxia: Secondary | ICD-10-CM

## 2013-01-02 DIAGNOSIS — T502X5A Adverse effect of carbonic-anhydrase inhibitors, benzothiadiazides and other diuretics, initial encounter: Secondary | ICD-10-CM | POA: Diagnosis present

## 2013-01-02 DIAGNOSIS — E669 Obesity, unspecified: Secondary | ICD-10-CM | POA: Diagnosis present

## 2013-01-02 DIAGNOSIS — E119 Type 2 diabetes mellitus without complications: Secondary | ICD-10-CM | POA: Diagnosis present

## 2013-01-02 DIAGNOSIS — I5033 Acute on chronic diastolic (congestive) heart failure: Secondary | ICD-10-CM | POA: Diagnosis present

## 2013-01-02 DIAGNOSIS — R609 Edema, unspecified: Secondary | ICD-10-CM

## 2013-01-02 DIAGNOSIS — I4891 Unspecified atrial fibrillation: Secondary | ICD-10-CM | POA: Diagnosis present

## 2013-01-02 DIAGNOSIS — R5381 Other malaise: Secondary | ICD-10-CM | POA: Diagnosis present

## 2013-01-02 DIAGNOSIS — K219 Gastro-esophageal reflux disease without esophagitis: Secondary | ICD-10-CM | POA: Diagnosis present

## 2013-01-02 DIAGNOSIS — A0472 Enterocolitis due to Clostridium difficile, not specified as recurrent: Secondary | ICD-10-CM

## 2013-01-02 DIAGNOSIS — I482 Chronic atrial fibrillation, unspecified: Secondary | ICD-10-CM

## 2013-01-02 DIAGNOSIS — R601 Generalized edema: Secondary | ICD-10-CM

## 2013-01-02 DIAGNOSIS — N390 Urinary tract infection, site not specified: Secondary | ICD-10-CM | POA: Diagnosis present

## 2013-01-02 DIAGNOSIS — F329 Major depressive disorder, single episode, unspecified: Secondary | ICD-10-CM | POA: Diagnosis present

## 2013-01-02 DIAGNOSIS — N189 Chronic kidney disease, unspecified: Secondary | ICD-10-CM | POA: Diagnosis present

## 2013-01-02 DIAGNOSIS — R531 Weakness: Secondary | ICD-10-CM

## 2013-01-02 DIAGNOSIS — I5021 Acute systolic (congestive) heart failure: Secondary | ICD-10-CM

## 2013-01-02 DIAGNOSIS — Z79899 Other long term (current) drug therapy: Secondary | ICD-10-CM

## 2013-01-02 DIAGNOSIS — Z794 Long term (current) use of insulin: Secondary | ICD-10-CM

## 2013-01-02 DIAGNOSIS — I10 Essential (primary) hypertension: Secondary | ICD-10-CM | POA: Diagnosis present

## 2013-01-02 DIAGNOSIS — E873 Alkalosis: Secondary | ICD-10-CM | POA: Diagnosis not present

## 2013-01-02 DIAGNOSIS — F3289 Other specified depressive episodes: Secondary | ICD-10-CM | POA: Diagnosis present

## 2013-01-02 DIAGNOSIS — J9620 Acute and chronic respiratory failure, unspecified whether with hypoxia or hypercapnia: Secondary | ICD-10-CM | POA: Diagnosis present

## 2013-01-02 DIAGNOSIS — I5032 Chronic diastolic (congestive) heart failure: Secondary | ICD-10-CM

## 2013-01-02 DIAGNOSIS — G9341 Metabolic encephalopathy: Secondary | ICD-10-CM | POA: Diagnosis present

## 2013-01-02 DIAGNOSIS — N183 Chronic kidney disease, stage 3 unspecified: Secondary | ICD-10-CM | POA: Diagnosis present

## 2013-01-02 DIAGNOSIS — H6120 Impacted cerumen, unspecified ear: Secondary | ICD-10-CM | POA: Diagnosis not present

## 2013-01-02 DIAGNOSIS — R627 Adult failure to thrive: Secondary | ICD-10-CM

## 2013-01-02 DIAGNOSIS — I1310 Hypertensive heart and chronic kidney disease without heart failure, with stage 1 through stage 4 chronic kidney disease, or unspecified chronic kidney disease: Secondary | ICD-10-CM

## 2013-01-02 DIAGNOSIS — I480 Paroxysmal atrial fibrillation: Secondary | ICD-10-CM | POA: Diagnosis present

## 2013-01-02 DIAGNOSIS — G4733 Obstructive sleep apnea (adult) (pediatric): Secondary | ICD-10-CM | POA: Diagnosis present

## 2013-01-02 DIAGNOSIS — J9610 Chronic respiratory failure, unspecified whether with hypoxia or hypercapnia: Secondary | ICD-10-CM

## 2013-01-02 DIAGNOSIS — I509 Heart failure, unspecified: Secondary | ICD-10-CM | POA: Diagnosis present

## 2013-01-02 DIAGNOSIS — Z7901 Long term (current) use of anticoagulants: Secondary | ICD-10-CM

## 2013-01-02 DIAGNOSIS — J449 Chronic obstructive pulmonary disease, unspecified: Secondary | ICD-10-CM | POA: Diagnosis present

## 2013-01-02 DIAGNOSIS — R0689 Other abnormalities of breathing: Secondary | ICD-10-CM | POA: Diagnosis present

## 2013-01-02 DIAGNOSIS — R0602 Shortness of breath: Secondary | ICD-10-CM | POA: Diagnosis present

## 2013-01-02 DIAGNOSIS — I130 Hypertensive heart and chronic kidney disease with heart failure and stage 1 through stage 4 chronic kidney disease, or unspecified chronic kidney disease: Principal | ICD-10-CM | POA: Diagnosis present

## 2013-01-02 DIAGNOSIS — J4489 Other specified chronic obstructive pulmonary disease: Secondary | ICD-10-CM | POA: Diagnosis present

## 2013-01-02 LAB — CBC WITH DIFFERENTIAL/PLATELET
Basophils Relative: 0 % (ref 0–1)
Eosinophils Absolute: 0.1 10*3/uL (ref 0.0–0.7)
HCT: 33.9 % — ABNORMAL LOW (ref 36.0–46.0)
Hemoglobin: 10.4 g/dL — ABNORMAL LOW (ref 12.0–15.0)
Lymphs Abs: 0.9 10*3/uL (ref 0.7–4.0)
MCH: 27.1 pg (ref 26.0–34.0)
MCHC: 30.7 g/dL (ref 30.0–36.0)
MCV: 88.3 fL (ref 78.0–100.0)
Monocytes Absolute: 0.3 10*3/uL (ref 0.1–1.0)
Monocytes Relative: 5 % (ref 3–12)
Neutrophils Relative %: 77 % (ref 43–77)
RBC: 3.84 MIL/uL — ABNORMAL LOW (ref 3.87–5.11)

## 2013-01-02 LAB — BASIC METABOLIC PANEL
BUN: 43 mg/dL — ABNORMAL HIGH (ref 6–23)
Chloride: 93 mEq/L — ABNORMAL LOW (ref 96–112)
Creatinine, Ser: 1.65 mg/dL — ABNORMAL HIGH (ref 0.50–1.10)
GFR calc non Af Amer: 28 mL/min — ABNORMAL LOW (ref >90)
Glucose, Bld: 122 mg/dL — ABNORMAL HIGH (ref 70–99)
Potassium: 3.9 mEq/L (ref 3.5–5.1)

## 2013-01-02 LAB — GLUCOSE, CAPILLARY
Glucose-Capillary: 128 mg/dL — ABNORMAL HIGH (ref 70–99)
Glucose-Capillary: 103 mg/dL — ABNORMAL HIGH (ref 70–99)

## 2013-01-02 MED ORDER — AMIODARONE HCL 200 MG PO TABS
200.0000 mg | ORAL_TABLET | Freq: Every day | ORAL | Status: DC
  Administered 2013-01-03 – 2013-01-06 (×4): 200 mg via ORAL
  Filled 2013-01-02 (×4): qty 1

## 2013-01-02 MED ORDER — PANTOPRAZOLE SODIUM 40 MG PO TBEC
40.0000 mg | DELAYED_RELEASE_TABLET | Freq: Every day | ORAL | Status: DC
  Administered 2013-01-03 – 2013-01-23 (×21): 40 mg via ORAL
  Filled 2013-01-02 (×22): qty 1

## 2013-01-02 MED ORDER — ALLOPURINOL 100 MG PO TABS
100.0000 mg | ORAL_TABLET | Freq: Every day | ORAL | Status: DC
  Administered 2013-01-03 – 2013-01-23 (×21): 100 mg via ORAL
  Filled 2013-01-02 (×21): qty 1

## 2013-01-02 MED ORDER — OXYCODONE HCL ER 10 MG PO T12A
10.0000 mg | EXTENDED_RELEASE_TABLET | Freq: Two times a day (BID) | ORAL | Status: DC
  Administered 2013-01-03 – 2013-01-23 (×42): 10 mg via ORAL
  Filled 2013-01-02 (×47): qty 1

## 2013-01-02 MED ORDER — SODIUM CHLORIDE 0.9 % IV SOLN: 250.0000 mL | INTRAVENOUS | Status: DC | PRN

## 2013-01-02 MED ORDER — INSULIN ASPART 100 UNIT/ML ~~LOC~~ SOLN
0.0000 [IU] | Freq: Every day | SUBCUTANEOUS | Status: DC
  Administered 2013-01-03 – 2013-01-06 (×3): 2 [IU] via SUBCUTANEOUS
  Administered 2013-01-07: 22:00:00 via SUBCUTANEOUS
  Administered 2013-01-10: 2 [IU] via SUBCUTANEOUS
  Administered 2013-01-17: 3 [IU] via SUBCUTANEOUS
  Administered 2013-01-19: 2 [IU] via SUBCUTANEOUS

## 2013-01-02 MED ORDER — ALBUTEROL SULFATE (5 MG/ML) 0.5% IN NEBU
2.5000 mg | INHALATION_SOLUTION | RESPIRATORY_TRACT | Status: DC | PRN
  Administered 2013-01-04: 2.5 mg via RESPIRATORY_TRACT
  Filled 2013-01-02: qty 0.5

## 2013-01-02 MED ORDER — DOCUSATE SODIUM 100 MG PO CAPS
100.0000 mg | ORAL_CAPSULE | Freq: Three times a day (TID) | ORAL | Status: DC
  Administered 2013-01-03 – 2013-01-22 (×53): 100 mg via ORAL
  Filled 2013-01-02 (×67): qty 1

## 2013-01-02 MED ORDER — SPIRONOLACTONE 25 MG PO TABS
25.0000 mg | ORAL_TABLET | Freq: Every day | ORAL | Status: DC
  Administered 2013-01-03 – 2013-01-14 (×10): 25 mg via ORAL
  Filled 2013-01-02 (×14): qty 1

## 2013-01-02 MED ORDER — ONDANSETRON HCL 4 MG/2ML IJ SOLN
4.0000 mg | Freq: Four times a day (QID) | INTRAMUSCULAR | Status: DC | PRN
  Administered 2013-01-04 – 2013-01-22 (×10): 4 mg via INTRAVENOUS
  Filled 2013-01-02 (×11): qty 2

## 2013-01-02 MED ORDER — ISOSORBIDE DINITRATE 20 MG PO TABS
20.0000 mg | ORAL_TABLET | Freq: Three times a day (TID) | ORAL | Status: DC
  Administered 2013-01-03 – 2013-01-06 (×11): 20 mg via ORAL
  Filled 2013-01-02 (×14): qty 1

## 2013-01-02 MED ORDER — FUROSEMIDE 10 MG/ML IJ SOLN
120.0000 mg | Freq: Four times a day (QID) | INTRAVENOUS | Status: DC
  Administered 2013-01-03 (×3): 120 mg via INTRAVENOUS
  Filled 2013-01-02 (×4): qty 12

## 2013-01-02 MED ORDER — POLYETHYLENE GLYCOL 3350 17 G PO PACK
17.0000 g | PACK | Freq: Two times a day (BID) | ORAL | Status: DC
  Administered 2013-01-03 – 2013-01-21 (×30): 17 g via ORAL
  Filled 2013-01-02 (×45): qty 1

## 2013-01-02 MED ORDER — ACETAMINOPHEN 325 MG PO TABS
650.0000 mg | ORAL_TABLET | ORAL | Status: DC | PRN
  Administered 2013-01-09 – 2013-01-22 (×12): 650 mg via ORAL
  Filled 2013-01-02 (×6): qty 2
  Filled 2013-01-02: qty 1
  Filled 2013-01-02 (×7): qty 2

## 2013-01-02 MED ORDER — SIMETHICONE 80 MG PO CHEW
80.0000 mg | CHEWABLE_TABLET | Freq: Four times a day (QID) | ORAL | Status: DC | PRN
  Administered 2013-01-04 – 2013-01-19 (×3): 80 mg via ORAL
  Filled 2013-01-02 (×4): qty 1

## 2013-01-02 MED ORDER — SODIUM CHLORIDE 0.9 % IJ SOLN
3.0000 mL | Freq: Two times a day (BID) | INTRAMUSCULAR | Status: DC
  Administered 2013-01-03 – 2013-01-06 (×5): 3 mL via INTRAVENOUS

## 2013-01-02 MED ORDER — OXYCODONE-ACETAMINOPHEN 5-325 MG PO TABS
2.0000 | ORAL_TABLET | Freq: Once | ORAL | Status: AC
  Administered 2013-01-02: 2 via ORAL
  Filled 2013-01-02: qty 2

## 2013-01-02 MED ORDER — DIPHENHYDRAMINE HCL 25 MG PO CAPS
25.0000 mg | ORAL_CAPSULE | Freq: Three times a day (TID) | ORAL | Status: DC | PRN
  Administered 2013-01-05 – 2013-01-18 (×5): 25 mg via ORAL
  Filled 2013-01-02 (×6): qty 1

## 2013-01-02 MED ORDER — INSULIN ASPART 100 UNIT/ML ~~LOC~~ SOLN
0.0000 [IU] | Freq: Three times a day (TID) | SUBCUTANEOUS | Status: DC
  Administered 2013-01-03 (×3): 2 [IU] via SUBCUTANEOUS
  Administered 2013-01-04 (×3): 1 [IU] via SUBCUTANEOUS
  Administered 2013-01-05: 2 [IU] via SUBCUTANEOUS
  Administered 2013-01-05: 1 [IU] via SUBCUTANEOUS
  Administered 2013-01-05: 2 [IU] via SUBCUTANEOUS
  Administered 2013-01-06: 1 [IU] via SUBCUTANEOUS
  Administered 2013-01-06 – 2013-01-07 (×3): 2 [IU] via SUBCUTANEOUS
  Administered 2013-01-07: 3 [IU] via SUBCUTANEOUS
  Administered 2013-01-08 – 2013-01-09 (×2): 2 [IU] via SUBCUTANEOUS
  Administered 2013-01-09 (×2): 3 [IU] via SUBCUTANEOUS
  Administered 2013-01-10 – 2013-01-11 (×4): 1 [IU] via SUBCUTANEOUS
  Administered 2013-01-11 (×2): 2 [IU] via SUBCUTANEOUS
  Administered 2013-01-12: 5 [IU] via SUBCUTANEOUS
  Administered 2013-01-12: 1 [IU] via SUBCUTANEOUS
  Administered 2013-01-13 (×2): 2 [IU] via SUBCUTANEOUS
  Administered 2013-01-14 – 2013-01-15 (×3): 1 [IU] via SUBCUTANEOUS
  Administered 2013-01-15: 2 [IU] via SUBCUTANEOUS
  Administered 2013-01-16: 1 [IU] via SUBCUTANEOUS
  Administered 2013-01-16: 13:00:00 via SUBCUTANEOUS
  Administered 2013-01-17 (×2): 1 [IU] via SUBCUTANEOUS
  Administered 2013-01-17: 2 [IU] via SUBCUTANEOUS
  Administered 2013-01-18: 1 [IU] via SUBCUTANEOUS
  Administered 2013-01-19: 3 [IU] via SUBCUTANEOUS
  Administered 2013-01-20 – 2013-01-21 (×2): 2 [IU] via SUBCUTANEOUS
  Administered 2013-01-22 (×2): 1 [IU] via SUBCUTANEOUS

## 2013-01-02 MED ORDER — BUSPIRONE HCL 5 MG PO TABS
5.0000 mg | ORAL_TABLET | Freq: Two times a day (BID) | ORAL | Status: DC
  Administered 2013-01-03 – 2013-01-23 (×41): 5 mg via ORAL
  Filled 2013-01-02 (×43): qty 1

## 2013-01-02 MED ORDER — INSULIN GLARGINE 100 UNIT/ML ~~LOC~~ SOLN
5.0000 [IU] | Freq: Every day | SUBCUTANEOUS | Status: DC
  Administered 2013-01-03 – 2013-01-06 (×5): 5 [IU] via SUBCUTANEOUS

## 2013-01-02 MED ORDER — SODIUM CHLORIDE 0.9 % IJ SOLN
3.0000 mL | INTRAMUSCULAR | Status: DC | PRN
  Administered 2013-01-06 (×2): 3 mL via INTRAVENOUS

## 2013-01-02 MED ORDER — VITAMIN K 100 MCG PO TABS: 100.0000 ug | ORAL_TABLET | Freq: Every day | ORAL | Status: DC

## 2013-01-02 MED ORDER — GUAIFENESIN ER 600 MG PO TB12
600.0000 mg | ORAL_TABLET | Freq: Two times a day (BID) | ORAL | Status: DC
  Administered 2013-01-03 – 2013-01-23 (×42): 600 mg via ORAL
  Filled 2013-01-02 (×43): qty 1

## 2013-01-02 MED ORDER — TRAZODONE HCL 50 MG PO TABS
50.0000 mg | ORAL_TABLET | Freq: Every day | ORAL | Status: DC
  Administered 2013-01-03 – 2013-01-22 (×19): 50 mg via ORAL
  Filled 2013-01-02 (×23): qty 1

## 2013-01-02 MED ORDER — HYDROCODONE-ACETAMINOPHEN 5-325 MG PO TABS
1.0000 | ORAL_TABLET | ORAL | Status: DC | PRN
  Administered 2013-01-03 – 2013-01-13 (×25): 1 via ORAL
  Filled 2013-01-02 (×25): qty 1

## 2013-01-02 MED ORDER — IPRATROPIUM BROMIDE 0.02 % IN SOLN
0.5000 mg | Freq: Four times a day (QID) | RESPIRATORY_TRACT | Status: DC
  Administered 2013-01-03 (×4): 0.5 mg via RESPIRATORY_TRACT
  Filled 2013-01-02 (×4): qty 2.5

## 2013-01-02 MED ORDER — HYDRALAZINE HCL 25 MG PO TABS
25.0000 mg | ORAL_TABLET | Freq: Three times a day (TID) | ORAL | Status: DC
  Administered 2013-01-03 – 2013-01-04 (×5): 25 mg via ORAL
  Filled 2013-01-02 (×7): qty 1

## 2013-01-02 MED ORDER — CARVEDILOL 25 MG PO TABS
25.0000 mg | ORAL_TABLET | Freq: Two times a day (BID) | ORAL | Status: DC
  Administered 2013-01-03 – 2013-01-10 (×15): 25 mg via ORAL
  Filled 2013-01-02 (×18): qty 1

## 2013-01-02 MED ORDER — OXYCODONE HCL 5 MG PO TABS
5.0000 mg | ORAL_TABLET | ORAL | Status: DC | PRN
  Administered 2013-01-03 – 2013-01-18 (×29): 5 mg via ORAL
  Filled 2013-01-02 (×30): qty 1

## 2013-01-02 NOTE — ED Notes (Signed)
Patient transported to X-ray 

## 2013-01-02 NOTE — H&P (Signed)
PCP:     Ernestine Conrad, MD    Chief Complaint:  swelling  HPI: Gina Ashley is a 79 y.o. female   has a past medical history of GERD (gastroesophageal reflux disease); Back pain, chronic; Pain in shoulder; Pain in ankle; Hiatal hernia; HTN (hypertension); CKD (chronic kidney disease), stage III; Atrial fibrillation; Sleep apnea; Fall (3 weeks ago); OSA on CPAP; Depression; PONV (postoperative nausea and vomiting); Chronic diastolic heart failure (08/31/2012); Tibia fracture; Renal failure; Fibula fracture; Respiratory failure; COPD (chronic obstructive pulmonary disease); Metabolic encephalopathy; Renal failure; UTI (urinary tract infection); Pleural effusion; Diabetes mellitus; and CHF (congestive heart failure).   Presented with  79 yo W hx of complicated hospital stay due to numerous lower extremities' fractures. She has hx of CHF and CKD in the past requiring short term dialysis. IN the past she have responded well to high doses of lasix for diuresis.  She also has hx of chronic respiratory failure due to obesity hypoventilation syndrome and COPD. She supposed to be on BIPAP at night and has chronic oxygen requirement of 2 L. She was discharged to [facility]  And have been living there since than.  Family states that 1 month ago she had her perma-cath was discontinued. Family states that for the past 6 days she have had worsening edema. She was given lasix IV but were not successful to  Diurese her. Her oxygen requirement stayed about the same but she was feeling more short of breath because of increasing abdominal girth. Patient states she wanted to have her Lasix increased butt it was not done instead she was sent to [facility] ER for further treatment. Patient states that the food that she is  being fed is very salty and she attributes her fluid gain to this. According to the records it seems that at first her Lasix was attempted to be titrated up and then lunch and then she was switched to  torsemide 20 mg a day and was given albumin IV twice a day albumin level was checked on February 24 and was 3.5. Patient did not endorse any chest pains.  Patient states that she was told that she'll likely will need dialysis and that is the reason why she was transferred to [facility]. Patient is tearful and stating she does not wish to go on dialysis. I spoke to nephrology regarding the patient quit this point recommends IV Lasix 120 mg IV every 6 hours. Given patient overall condition at this point she is a good dialysis candidate.  Review of Systems:    Pertinent positives include: Orthopnea, anasarca, shortness of breath at rest.   dyspnea on exertion,   Constitutional:  No weight loss, night sweats, Fevers, chills, fatigue, weight loss  HEENT:  No headaches, Difficulty swallowing,Tooth/dental problems,Sore throat,  No sneezing, itching, ear ache, nasal congestion, post nasal drip,  Cardio-vascular:  No chest pain,  PND,  dizziness, palpitations.no Bilateral lower extremity swelling  GI:  No heartburn, indigestion, abdominal pain, nausea, vomiting, diarrhea, change in bowel habits, loss of appetite, melena, blood in stool, hematemesis Resp:  noNo excess mucus, no productive cough, No non-productive cough, No coughing up of blood.No change in color of mucus.No wheezing. Skin:  no rash or lesions. No jaundice GU:  no dysuria, change in color of urine, no urgency or frequency. No straining to urinate.  No flank pain.  Musculoskeletal:  No joint pain or no joint swelling. No decreased range of motion. No back pain.  Psych:  No  change in mood or affect. No depression or anxiety. No memory loss.  Neuro: no localizing neurological complaints, no tingling, no weakness, no double vision, no gait abnormality, no slurred speech, no confusion  Otherwise ROS are negative except for above, 10 systems were reviewed  Past Medical History: Past Medical History  Diagnosis Date  . GERD  (gastroesophageal reflux disease)   . Back pain, chronic   . Pain in shoulder   . Pain in ankle   . Hiatal hernia   . HTN (hypertension)     all her life  . CKD (chronic kidney disease), stage III     GFR: 38  . Atrial fibrillation     since 1996  . Sleep apnea   . Fall 3 weeks ago    was evaluated at [facility]  . OSA on CPAP   . Depression   . PONV (postoperative nausea and vomiting)     difficult to wake up  . Chronic diastolic heart failure 08/31/2012  . Tibia fracture     BILATERAL  . Renal failure     acute on chronic   . Fibula fracture     bilateral  . Respiratory failure     acute on chronic hx of  requiring BIPAP  . COPD (chronic obstructive pulmonary disease)   . Metabolic encephalopathy     hx of   . Renal failure     acute on chronic with need for intermittent dialysis - hx of   . UTI (urinary tract infection)     hx of   . Pleural effusion     right sided now resolved   . Diabetes mellitus     x 15 yrs, hx of hypoglycemia   . CHF (congestive heart failure)     diastolic HF   Past Surgical History  Procedure Laterality Date  . Bunionectomy      bilateral  . Cholecystectomy    . Rotator cuff repair      2002  . Total knee arthroplasty      bilateral 2001  . Hammer toe surgery      2004  . Colonoscopy  07/11/2011    Procedure: COLONOSCOPY;  Surgeon: Malissa Hippo, MD;  Location: [facility];  Service: Endoscopy;  Laterality: N/A;  3:00  . Orif periprosthetic fracture  09/08/2012    Procedure: OPEN REDUCTION INTERNAL FIXATION (ORIF) PERIPROSTHETIC FRACTURE;  Surgeon: Shelda Pal, MD;  Location: [facility] ORS;  Service: Orthopedics;  Laterality: Right;  ORIF periprosthetic right proximal femur fracture Spanning external fixator left distal tibia  . External fixation leg  09/08/2012    Procedure: EXTERNAL FIXATION LEG;  Surgeon: Shelda Pal, MD;  Location: [facility] ORS;  Service: Orthopedics;  Laterality: Left;  . Joint replacement      bilateral knees    . External fixation leg  11/30/2012    Procedure: EXTERNAL FIXATION LEG;  Surgeon: Shelda Pal, MD;  Location: [facility] ORS;  Service: Orthopedics;  Laterality: Left;  Removal of External Fixation Left Knee with Evaluation with Floroscopy     Medications: Prior to Admission medications   Medication Sig Start Date End Date Taking? Authorizing Provider  busPIRone (BUSPAR) 5 MG tablet Take 5 mg by mouth every 12 (twelve) hours.   Yes Historical Provider, MD  diphenhydrAMINE (BENADRYL) 25 mg capsule Take 25 mg by mouth every 8 (eight) hours as needed for itching.   Yes Historical Provider, MD  HYDROcodone-acetaminophen (NORCO/VICODIN) 5-325 MG per tablet  Take 1 tablet by mouth every 4 (four) hours as needed for pain.   Yes Historical Provider, MD  Multiple Vitamin (MULTIVITAMIN WITH MINERALS) TABS Take 1 tablet by mouth daily.   Yes Historical Provider, MD  ondansetron (ZOFRAN) 4 MG tablet Take 4 mg by mouth every 6 (six) hours as needed for nausea.   Yes Historical Provider, MD  oxyCODONE (OXY IR/ROXICODONE) 5 MG immediate release tablet Take 5 mg by mouth every 4 (four) hours as needed for pain.   Yes Historical Provider, MD  simethicone (MYLICON) 80 MG chewable tablet Chew 80 mg by mouth every 6 (six) hours as needed for flatulence.   Yes Historical Provider, MD  spironolactone (ALDACTONE) 25 MG tablet Take 25 mg by mouth daily.   Yes Historical Provider, MD  torsemide (DEMADEX) 20 MG tablet Take 20 mg by mouth daily.   Yes Historical Provider, MD  vitamin C (ASCORBIC ACID) 500 MG tablet Take 500 mg by mouth every 12 (twelve) hours.   Yes Historical Provider, MD  warfarin (COUMADIN) 3 MG tablet Take 3 mg by mouth daily.   Yes Historical Provider, MD  ZINC OXIDE EX Apply 1 application topically daily.   Yes Historical Provider, MD  allopurinol (ZYLOPRIM) 100 MG tablet Take 100 mg by mouth daily.    Historical Provider, MD  amiodarone (PACERONE) 200 MG tablet Take 200 mg by mouth daily.      Historical Provider, MD  carvedilol (COREG) 25 MG tablet Take 25 mg by mouth 2 (two) times daily with a meal.    Historical Provider, MD  docusate sodium (COLACE) 100 MG capsule Take 100 mg by mouth every 8 (eight) hours.     Historical Provider, MD  guaiFENesin (MUCINEX) 600 MG 12 hr tablet Take 600 mg by mouth 2 (two) times daily.     Historical Provider, MD  hydrALAZINE (APRESOLINE) 25 MG tablet Take 25 mg by mouth 3 (three) times daily.    Historical Provider, MD  insulin aspart (NOVOLOG) 100 UNIT/ML injection Inject 1-5 Units into the skin 3 (three) times daily with meals. 150-200=1unit,201-250=2units,251-300=3units,301-350=4units,351-400=5units if over 400 call MD 09/15/12   Gwenyth Bender, NP  insulin glargine (LANTUS) 100 UNIT/ML injection Inject 5 Units into the skin at bedtime.    Historical Provider, MD  isosorbide dinitrate (ISORDIL) 20 MG tablet Take 20 mg by mouth every 8 (eight) hours.     Historical Provider, MD  nitroGLYCERIN (NITROSTAT) 0.4 MG SL tablet Place 0.4 mg under the tongue every 5 (five) minutes as needed. Chest pain    Historical Provider, MD  oxyCODONE (OXYCONTIN) 10 MG 12 hr tablet Take 10 mg by mouth every 12 (twelve) hours.    Historical Provider, MD  pantoprazole (PROTONIX) 40 MG tablet Take 40 mg by mouth daily.    Historical Provider, MD  polyethylene glycol (MIRALAX / GLYCOLAX) packet Take 17 g by mouth 2 (two) times daily.     Historical Provider, MD  traZODone (DESYREL) 50 MG tablet Take 50 mg by mouth at bedtime.    Historical Provider, MD  Vitamin D, Ergocalciferol, (DRISDOL) 50000 UNITS CAPS Take 50,000 Units by mouth every 7 (seven) days.    Historical Provider, MD  vitamin k 100 MCG tablet Take 100 mcg by mouth daily.    Historical Provider, MD    Allergies:   Allergies  Allergen Reactions  . Morphine Sulfate Other (See Comments)    REACTION: change in personality  . Remeron (Mirtazapine) Other (See Comments)  Altered mental status, lethargy  .  Tuna (Fish Allergy) Other (See Comments)    unknown  . Penicillins Rash    Tolerates Ancef.    Social History:  Bed bound, non weightbearing on left low extremity Resides at [facility]    reports that she has never smoked. She has never used smokeless tobacco. She reports that she does not drink alcohol or use illicit drugs.   Family History: family history includes Colon cancer in her brother.    Physical Exam: Patient Vitals for the past 24 hrs:  BP Temp Temp src Pulse Resp SpO2 Weight  01/02/13 1932 152/103 mmHg - - 105 14 99 % -  01/02/13 1854 - - - - - - 132.904 kg (293 lb)  01/02/13 1838 163/105 mmHg 98.3 F (36.8 C) Oral - 18 99 % -  01/02/13 1530 146/99 mmHg - - 115 14 98 % -  01/02/13 1436 154/113 mmHg 97.9 F (36.6 C) Oral - 16 97 % -    1. General:  in No Acute distress 2. Psychological: Alert and  Oriented 3. Head/ENT:   Moist Mucous Membranes                          Head Non traumatic, neck supple                          Normal   Dentition 4. SKIN: normal   Skin turgor,  Skin clean Dry and intact no rash 5. Heart: irregular rate and rhythm no Murmur, Rub or gallop 6. Lungs: no wheezes occasional crackles   7. Abdomen: Soft, non-tender, Non distended, anasarca noted 8. Lower extremities: no clubbing, cyanosis, severe piting edema up to the thighs, left leg in splint 9. Neurologically Grossly intact, moving all 4 extremities equally 10. MSK: Normal range of motion  body mass index is 51.92 kg/(m^2).   Labs on Admission:   Recent Labs  01/02/13 1511  NA 141  K 3.9  CL 93*  CO2 41*  GLUCOSE 122*  BUN 43*  CREATININE 1.65*  CALCIUM 10.1   No results found for this basename: AST, ALT, ALKPHOS, BILITOT, PROT, ALBUMIN,  in the last 72 hours No results found for this basename: LIPASE, AMYLASE,  in the last 72 hours  Recent Labs  01/02/13 1511  WBC 5.6  NEUTROABS 4.3  HGB 10.4*  HCT 33.9*  MCV 88.3  PLT 95*   No results found for  this basename: CKTOTAL, CKMB, CKMBINDEX, TROPONINI,  in the last 72 hours No results found for this basename: TSH, T4TOTAL, FREET3, T3FREE, THYROIDAB,  in the last 72 hours No results found for this basename: VITAMINB12, FOLATE, FERRITIN, TIBC, IRON, RETICCTPCT,  in the last 72 hours Lab Results  Component Value Date   HGBA1C 7.0* 09/04/2012    The CrCl is unknown because both a height and weight (above a minimum accepted value) are required for this calculation. ABG    Component Value Date/Time   PHART 7.438 09/08/2012 2334   HCO3 33.4* 09/08/2012 2334   TCO2 30.0 09/08/2012 2334   O2SAT 100.0 09/08/2012 2334     No results found for this basename: DDIMER     Other results:  I have pearsonaly reviewed this: ECG REPORT  Rate: 105  Rhythm: Atrial flutter ST&T Change: No acute ischemia  Cultures:    Component Value Date/Time   SDES BLOOD LEFT ARM 09/09/2012 1255  SPECREQUEST BOTTLES DRAWN AEROBIC AND ANAEROBIC 6CC 09/09/2012 1255   CULT NO GROWTH 5 DAYS 09/09/2012 1255   REPTSTATUS 09/15/2012 FINAL 09/09/2012 1255       Radiological Exams on Admission: Dg Chest 2 View  01/02/2013  *RADIOLOGY REPORT*  Clinical Data: Shortness of breath  CHEST - 2 VIEW  Comparison: 11/24/2012  Findings: Cardiomegaly with mild interstitial edema, stable. Moderate right pleural effusion, increased.  Small left pleural effusion, unchanged.  No pneumothorax.  Interval removal of right PICC and left chest dual lumen catheter.  Degenerative changes of the visualized thoracolumbar spine.  IMPRESSION: Cardiomegaly with mild interstitial edema and small left pleural effusion, unchanged.  Moderate right pleural effusion, increased.   Original Report Authenticated By: Charline Bills, M.D.     Chart has been reviewed  Assessment/Plan  This is a 79 year old female with history of diastolic heart failure, chronic kidney disease, chronic respiratory failure secondary to COPD and obesity hypoventilation syndrome  presents with worsening anasarca  Present on Admission:  Anasarca  - patient has combination of chronic kidney disease is well diastolic heart failure and possibly right-sided failure. Discuss patient with renal at this point recommending high dose Lasix at 128 mg  every 6 hours. Will order albumin level and urine electrolytes  . PAF (paroxysmal atrial fibrillation), was in SR 06/2012  - continue Coumadin, amiodarone and coreg . OBSTRUCTIVE SLEEP APNEA - BiPAP qhs . HYPERTENSION - continue home medications  . Hypercapnemia - chronic at this point patient is asymptomatic  . GERD - continue Protonix  . DM - sliding scale insulin  . COPD (chronic obstructive pulmonary disease) -when necessary nebulizers currently stable and  . CKD (chronic kidney disease) -creatinine at baseline,  renal is aware   . Chronic hypoxemic respiratory failure - currently at her baseline oxygen requirement she is somewhat more short of breath which she attributes to increased abdominal girth hopefully this will improve his diuresis  . Chronic diastolic heart failure - Will not repeat an echo gram she had one recently. Patient is on an ACE inhibitor candidate given renal insufficiency. The watch in telemetry CHF protocol. Strict I./ogive IV Lasix   Prophylaxis: on coumadin, Protonix  CODE STATUS: Limitted code, Do not do CPR do not intubate but ok to give pressors   Other plan as per orders.  I have spent a total of 65 min on this admission,  Time is taken to discuss her care with renal   Lyndon Chenoweth 01/02/2013, 8:16 PM

## 2013-01-02 NOTE — ED Notes (Signed)
Pt arrived by [facility] from [facility]. Per staff, pt needs emergent dialysis. Pt has no complaints.

## 2013-01-02 NOTE — ED Provider Notes (Signed)
History     CSN: [id number]  Arrival date & time 01/02/13  1427   First MD Initiated Contact with Patient 01/02/13 1500      Chief Complaint  Patient presents with  . Vascular Access Problem    HPI Gina Ashley is a 79 y.o. female who was sent here from [facility] to get dialysis catheter put in and for dialysis.  Patient is an ESRD patient but a few months ago developed enough kidney function that they were able to remove catheter.  Now patient only able to make a very little amount of urine.  <250cc daily from foley.  Was seen at hospital today where she was edematous and sent here for further evaluation.  Patient also noting increasing SOB.  No response to Lasix.  Patient been at [facility] over last three months for rehabilitation after bilateral leg fractures from fall.  Past Medical History  Diagnosis Date  . GERD (gastroesophageal reflux disease)   . Back pain, chronic   . Pain in shoulder   . Pain in ankle   . Hiatal hernia   . HTN (hypertension)     all her life  . CKD (chronic kidney disease), stage III     GFR: 38  . Atrial fibrillation     since 1996  . Sleep apnea   . Fall 3 weeks ago    was evaluated at [facility]  . OSA on CPAP   . Depression   . PONV (postoperative nausea and vomiting)     difficult to wake up  . Chronic diastolic heart failure 08/31/2012  . Tibia fracture     BILATERAL  . Renal failure     acute on chronic   . Fibula fracture     bilateral  . Respiratory failure     acute on chronic hx of  requiring BIPAP  . COPD (chronic obstructive pulmonary disease)   . Metabolic encephalopathy     hx of   . Renal failure     acute on chronic with need for intermittent dialysis - hx of   . UTI (urinary tract infection)     hx of   . Pleural effusion     right sided now resolved   . Diabetes mellitus     x 15 yrs, hx of hypoglycemia   . CHF (congestive heart failure)     diastolic HF    Past Surgical History  Procedure Laterality  Date  . Bunionectomy      bilateral  . Cholecystectomy    . Rotator cuff repair      2002  . Total knee arthroplasty      bilateral 2001  . Hammer toe surgery      2004  . Colonoscopy  07/11/2011    Procedure: COLONOSCOPY;  Surgeon: Malissa Hippo, MD;  [facility]: [facility];  Service: Endoscopy;  Laterality: N/A;  3:00  . Orif periprosthetic fracture  09/08/2012    Procedure: OPEN REDUCTION INTERNAL FIXATION (ORIF) PERIPROSTHETIC FRACTURE;  Surgeon: Shelda Pal, MD;  Location: [facility];  Service: Orthopedics;  Laterality: Right;  ORIF periprosthetic right proximal femur fracture Spanning external fixator left distal tibia  . External fixation leg  09/08/2012    Procedure: EXTERNAL FIXATION LEG;  Surgeon: Shelda Pal, MD;  Location: [facility];  Service: Orthopedics;  Laterality: Left;  . Joint replacement      bilateral knees   . External fixation leg  11/30/2012  Procedure: EXTERNAL FIXATION LEG;  Surgeon: Shelda Pal, MD;  Location: [facility];  Service: Orthopedics;  Laterality: Left;  Removal of External Fixation Left Knee with Evaluation with Floroscopy    Family History  Problem Relation Age of Onset  . Colon cancer Brother     History  Substance Use Topics  . Smoking status: Never Smoker   . Smokeless tobacco: Never Used  . Alcohol Use: No    OB History   Grav Para Term Preterm Abortions TAB SAB Ect Mult Living                  Review of Systems  Constitutional: Negative for fever and chills.  HENT: Negative for congestion, rhinorrhea, neck pain and neck stiffness.   Respiratory: Positive for shortness of breath. Negative for cough.   Cardiovascular: Negative for chest pain.  Gastrointestinal: Negative for nausea, vomiting, abdominal pain, diarrhea and abdominal distention.  Endocrine: Negative for polyuria.  Genitourinary: Negative for dysuria.  Skin: Negative for rash.  Neurological: Negative for headaches.  Psychiatric/Behavioral: Negative.   All other  systems reviewed and are negative.    Allergies  Morphine sulfate; Remeron; Tuna; and Penicillins  Home Medications   Current Outpatient Rx  Name  Route  Sig  Dispense  Refill  . allopurinol (ZYLOPRIM) 100 MG tablet   Oral   Take 100 mg by mouth daily.         Marland Kitchen amiodarone (PACERONE) 200 MG tablet   Oral   Take 200 mg by mouth daily before breakfast.          . bisacodyl (DULCOLAX) 10 MG suppository   Rectal   Place 1 suppository (10 mg total) rectally daily as needed.   12 suppository      . carvedilol (COREG) 25 MG tablet   Oral   Take 25 mg by mouth 2 (two) times daily with a meal.         . darbepoetin (ARANESP) 150 MCG/0.3ML SOLN   Subcutaneous   Inject 125 mcg into the skin every 7 (seven) days.         Marland Kitchen docusate sodium (COLACE) 100 MG capsule   Oral   Take 100 mg by mouth 2 (two) times daily.         Marland Kitchen guaiFENesin (MUCINEX) 600 MG 12 hr tablet   Oral   Take 600 mg by mouth 2 (two) times daily.          . hydrALAZINE (APRESOLINE) 25 MG tablet   Oral   Take 25 mg by mouth 3 (three) times daily.         Marland Kitchen HYDRALAZINE HCL IJ   Injection   Inject 10 mg as directed every 4 (four) hours as needed. heartbeat         . HYDROcodone-acetaminophen (NORCO) 7.5-325 MG per tablet   Oral   Take 1-2 tablets by mouth every 4 (four) hours as needed for pain.   120 tablet   0   . insulin aspart (NOVOLOG) 100 UNIT/ML injection   Subcutaneous   Inject 1-5 Units into the skin 3 (three) times daily with meals. 150-200=1unit,201-250=2units,251-300=3units,301-350=4units,351-400=5units if over 400 call MD         . insulin glargine (LANTUS) 100 UNIT/ML injection   Subcutaneous   Inject 5 Units into the skin at bedtime.         . isosorbide dinitrate (ISORDIL) 20 MG tablet   Oral   Take 20 mg by mouth 3 (  three) times daily.         Marland Kitchen lactulose (CHRONULAC) 10 GM/15ML solution   Oral   Take 20 g by mouth daily as needed. Constipation         .  methocarbamol (ROBAXIN) 500 MG tablet   Oral   Take 1 tablet (500 mg total) by mouth every 6 (six) hours as needed (muscle spasms).   50 tablet   0   . miconazole (MICOTIN) 2 % powder   Topical   Apply 1 application topically as needed. rash         . nitroGLYCERIN (NITROSTAT) 0.4 MG SL tablet   Sublingual   Place 0.4 mg under the tongue every 5 (five) minutes as needed. Chest pain         . oxyCODONE (OXYCONTIN) 10 MG 12 hr tablet   Oral   Take 10 mg by mouth every 12 (twelve) hours.         . Oxycodone HCl 10 MG TABS   Oral   Take 1 mg by mouth every 4 (four) hours as needed. Pain         . pantoprazole (PROTONIX) 40 MG tablet   Oral   Take 40 mg by mouth daily.         . polyethylene glycol (MIRALAX / GLYCOLAX) packet   Oral   Take 17 g by mouth 2 (two) times daily.          . sevelamer carbonate (RENVELA) 800 MG tablet   Oral   Take 800 mg by mouth 3 (three) times daily with meals.         . sodium chloride (OCEAN) 0.65 % nasal spray   Nasal   Place 1 spray into the nose every 6 (six) hours as needed. Dry nares         . sodium chloride 0.9 % SOLN 100 mL with iron sucrose 20 MG/ML SOLN   Intravenous   Inject 100 mg into the vein as directed. 3 times per week         . traZODone (DESYREL) 50 MG tablet   Oral   Take 50 mg by mouth at bedtime.         . Vitamin D, Ergocalciferol, (DRISDOL) 50000 UNITS CAPS   Oral   Take 50,000 Units by mouth every 7 (seven) days.         . vitamin k 100 MCG tablet   Oral   Take 100 mcg by mouth daily.         . Water For Irrigation, Sterile (STERILE WATER FOR IRRIGATION)   Irrigation   Irrigate with as directed once.           BP 154/113  Temp(Src) 97.9 F (36.6 C) (Oral)  Resp 16  SpO2 97%  Physical Exam  Nursing note and vitals reviewed. Constitutional: She is oriented to person, place, and time. She appears well-developed and well-nourished. No distress.  HENT:  Head: Normocephalic  and atraumatic.  Right Ear: External ear normal.  Left Ear: External ear normal.  Nose: Nose normal.  Mouth/Throat: Oropharynx is clear and moist. No oropharyngeal exudate.  Eyes: EOM are normal. Pupils are equal, round, and reactive to light.  Neck: Normal range of motion. Neck supple. No tracheal deviation present.  Cardiovascular: Normal rate.   Pulmonary/Chest: Effort normal. No stridor. No respiratory distress. She has no wheezes. She has rales (mild, bilateral).  Abdominal: Soft. She exhibits no distension. There is no tenderness.  There is no rebound.  Musculoskeletal: Normal range of motion. She exhibits edema (4+ bilateral).  Neurological: She is alert and oriented to person, place, and time.  Skin: Skin is warm and dry. She is not diaphoretic.    ED Course  Procedures (including critical care time)  Labs Reviewed  CBC WITH DIFFERENTIAL - Abnormal; Notable for the following:    RBC 3.84 (*)    Hemoglobin 10.4 (*)    HCT 33.9 (*)    RDW 16.8 (*)    Platelets 95 (*)    All other components within normal limits  BASIC METABOLIC PANEL - Abnormal; Notable for the following:    Chloride 93 (*)    CO2 41 (*)    Glucose, Bld 122 (*)    BUN 43 (*)    Creatinine, Ser 1.65 (*)    GFR calc non Af Amer 28 (*)    GFR calc Af Amer 32 (*)    All other components within normal limits    Date: 01/02/2013  Rate: 105  Rhythm: atrial fibrillation  QRS Axis: normal  Intervals: normal  ST/T Wave abnormalities: nonspecific T wave changes  Conduction Disutrbances:none  Narrative Interpretation: Low voltage.  Otherwise unchanged.  Old EKG Reviewed: none available    Dg Chest 2 View  01/02/2013  *RADIOLOGY REPORT*  Clinical Data: Shortness of breath  CHEST - 2 VIEW  Comparison: 11/24/2012  Findings: Cardiomegaly with mild interstitial edema, stable. Moderate right pleural effusion, increased.  Small left pleural effusion, unchanged.  No pneumothorax.  Interval removal of right PICC and  left chest dual lumen catheter.  Degenerative changes of the visualized thoracolumbar spine.  IMPRESSION: Cardiomegaly with mild interstitial edema and small left pleural effusion, unchanged.  Moderate right pleural effusion, increased.   Original Report Authenticated By: Charline Bills, M.D.      1. Chronic diastolic heart failure   2. Chronic hypoxemic respiratory failure   3. Obstructive sleep apnea (adult) (pediatric)   4. PAF (paroxysmal atrial fibrillation)   5. Anasarca       MDM   The patient is an 79 year old female resents emergency department from rehabilitation facility for anasarca and concern for need for dialysis. On exam patient with significant anasarca but no significant respiratory distress her oxygen requirement. Basic labs checked and found no dangerous electrolyte abnormalities indicating need for emergent or urgent dialysis. Patient will need inpatient management for continued diuresis. Patient safe for admission. Hospitalist consult for admission. Patient admitted. No other acute concerns while in the emergency department.      Arloa Koh, MD 01/02/13 2322

## 2013-01-03 ENCOUNTER — Encounter (HOSPITAL_COMMUNITY): Payer: Self-pay | Admitting: *Deleted

## 2013-01-03 DIAGNOSIS — N189 Chronic kidney disease, unspecified: Secondary | ICD-10-CM

## 2013-01-03 DIAGNOSIS — N179 Acute kidney failure, unspecified: Secondary | ICD-10-CM

## 2013-01-03 DIAGNOSIS — D638 Anemia in other chronic diseases classified elsewhere: Secondary | ICD-10-CM

## 2013-01-03 DIAGNOSIS — R0602 Shortness of breath: Secondary | ICD-10-CM | POA: Diagnosis present

## 2013-01-03 DIAGNOSIS — F329 Major depressive disorder, single episode, unspecified: Secondary | ICD-10-CM | POA: Diagnosis present

## 2013-01-03 DIAGNOSIS — R601 Generalized edema: Secondary | ICD-10-CM | POA: Diagnosis present

## 2013-01-03 LAB — URINE MICROSCOPIC-ADD ON

## 2013-01-03 LAB — COMPREHENSIVE METABOLIC PANEL
ALT: 12 U/L (ref 0–35)
Alkaline Phosphatase: 163 U/L — ABNORMAL HIGH (ref 39–117)
BUN: 43 mg/dL — ABNORMAL HIGH (ref 6–23)
CO2: 40 mEq/L (ref 19–32)
Chloride: 91 mEq/L — ABNORMAL LOW (ref 96–112)
GFR calc Af Amer: 33 mL/min — ABNORMAL LOW (ref >90)
GFR calc non Af Amer: 28 mL/min — ABNORMAL LOW (ref >90)
Glucose, Bld: 154 mg/dL — ABNORMAL HIGH (ref 70–99)
Potassium: 3.8 mEq/L (ref 3.5–5.1)
Sodium: 140 mEq/L (ref 135–145)
Total Bilirubin: 0.8 mg/dL (ref 0.3–1.2)
Total Protein: 7.1 g/dL (ref 6.0–8.3)

## 2013-01-03 LAB — URINALYSIS, ROUTINE W REFLEX MICROSCOPIC
Bilirubin Urine: NEGATIVE
Glucose, UA: NEGATIVE mg/dL
Specific Gravity, Urine: 1.01 (ref 1.005–1.030)
pH: 6 (ref 5.0–8.0)

## 2013-01-03 LAB — PHOSPHORUS: Phosphorus: 3.4 mg/dL (ref 2.3–4.6)

## 2013-01-03 LAB — CBC
HCT: 34 % — ABNORMAL LOW (ref 36.0–46.0)
MCHC: 30.3 g/dL (ref 30.0–36.0)
Platelets: 99 10*3/uL — ABNORMAL LOW (ref 150–400)
RDW: 16.7 % — ABNORMAL HIGH (ref 11.5–15.5)
WBC: 6.1 10*3/uL (ref 4.0–10.5)

## 2013-01-03 LAB — GLUCOSE, CAPILLARY
Glucose-Capillary: 167 mg/dL — ABNORMAL HIGH (ref 70–99)
Glucose-Capillary: 226 mg/dL — ABNORMAL HIGH (ref 70–99)

## 2013-01-03 LAB — CREATININE, URINE, RANDOM: Creatinine, Urine: 27.95 mg/dL

## 2013-01-03 LAB — TROPONIN I
Troponin I: <0.3 ng/mL (ref <0.30)
Troponin I: <0.3 ng/mL (ref <0.30)
Troponin I: <0.3 ng/mL (ref <0.30)

## 2013-01-03 LAB — PROTIME-INR: Prothrombin Time: 19.9 seconds — ABNORMAL HIGH (ref 11.6–15.2)

## 2013-01-03 LAB — PRO B NATRIURETIC PEPTIDE: Pro B Natriuretic peptide (BNP): 4706 pg/mL — ABNORMAL HIGH (ref 0–450)

## 2013-01-03 LAB — MRSA PCR SCREENING: MRSA by PCR: NEGATIVE

## 2013-01-03 MED ORDER — WARFARIN - PHARMACIST DOSING INPATIENT
Freq: Every day | Status: DC
  Administered 2013-01-14 – 2013-01-22 (×7)

## 2013-01-03 MED ORDER — METOLAZONE 5 MG PO TABS
5.0000 mg | ORAL_TABLET | Freq: Every day | ORAL | Status: DC
  Administered 2013-01-03 – 2013-01-04 (×2): 5 mg via ORAL
  Filled 2013-01-03 (×2): qty 1

## 2013-01-03 MED ORDER — WARFARIN SODIUM 5 MG PO TABS
5.0000 mg | ORAL_TABLET | Freq: Once | ORAL | Status: AC
  Administered 2013-01-03: 5 mg via ORAL
  Filled 2013-01-03: qty 1

## 2013-01-03 MED ORDER — FUROSEMIDE 10 MG/ML IJ SOLN
160.0000 mg | Freq: Four times a day (QID) | INTRAVENOUS | Status: DC
  Administered 2013-01-03 – 2013-01-07 (×14): 160 mg via INTRAVENOUS
  Filled 2013-01-03 (×19): qty 16

## 2013-01-03 MED ORDER — WARFARIN SODIUM 5 MG PO TABS
5.0000 mg | ORAL_TABLET | ORAL | Status: AC
  Administered 2013-01-03: 5 mg via ORAL
  Filled 2013-01-03: qty 1

## 2013-01-03 MED ORDER — IPRATROPIUM BROMIDE 0.02 % IN SOLN
0.5000 mg | Freq: Three times a day (TID) | RESPIRATORY_TRACT | Status: DC
  Administered 2013-01-04 – 2013-01-05 (×4): 0.5 mg via RESPIRATORY_TRACT
  Filled 2013-01-03 (×4): qty 2.5

## 2013-01-03 NOTE — Progress Notes (Signed)
CRITICAL VALUE ALERT  Critical value received: co2 40  Date of notification:  01/03/13  Time of notification:  0945  Critical value read back:yes  Nurse who received alert:  Mervin Hack rn  MD notified (1st page):  devine  Time of first page:  0945  MD notified (2nd page):  Time of second page:  Responding MD:  Dr Elisabeth Pigeon  Time MD responded:  [phone]

## 2013-01-03 NOTE — Consult Note (Signed)
Gina Ashley 01/03/2013 Evangelene Vora D Requesting Physician:  Dr Elisabeth Pigeon  Reason for Consult:  Edema HPI: The patient is a 79 y.o. year-old with hx of morbid obesity, HTN (all her life), CKD III, afib, OSA on CPAP and DM2 x 10-15 years presenting with marked anasarca to ED. She has been either in hospitals or SNF"s or LTAC's for the last 6 months. She fell and broke her R femur and L lower leg requiring surgical repair during this time. She has gotten temporary dialysis during this time, details not available. Creat is 1.6.  She says swelling of abdomen and legs has been there for 7 days. Denies SOB, n/v/d, CP or cough, orthopnea. No fam hx of kidney disease. No NSAID"S. Non ambulatory  UA shows no protein, high pH 7.5 Serum HCO3 is 40  CXR bilat effusions +/- edema ABG's from 2013 with pCO2 60-70 range Creatinine in last 8 mos runs between 1.1- 2.7   Past Medical History:  Past Medical History  Diagnosis Date  . GERD (gastroesophageal reflux disease)   . Back pain, chronic   . Pain in shoulder   . Pain in ankle   . Hiatal hernia   . HTN (hypertension)     all her life  . CKD (chronic kidney disease), stage III     GFR: 38  . Atrial fibrillation     since 1996  . Sleep apnea   . Fall 3 weeks ago    was evaluated at [facility]  . OSA on CPAP   . Depression   . PONV (postoperative nausea and vomiting)     difficult to wake up  . Chronic diastolic heart failure 08/31/2012  . Tibia fracture     BILATERAL  . Renal failure     acute on chronic   . Fibula fracture     bilateral  . Respiratory failure     acute on chronic hx of  requiring BIPAP  . COPD (chronic obstructive pulmonary disease)   . Metabolic encephalopathy     hx of   . Renal failure     acute on chronic with need for intermittent dialysis - hx of   . UTI (urinary tract infection)     hx of   . Pleural effusion     right sided now resolved   . Diabetes mellitus     x 15 yrs, hx of hypoglycemia   . CHF  (congestive heart failure)     diastolic HF    Past Surgical History:  Past Surgical History  Procedure Laterality Date  . Bunionectomy      bilateral  . Cholecystectomy    . Rotator cuff repair      2002  . Total knee arthroplasty      bilateral 2001  . Hammer toe surgery      2004  . Colonoscopy  07/11/2011    Procedure: COLONOSCOPY;  Surgeon: Malissa Hippo, MD;  [facility]: [facility];  Service: Endoscopy;  Laterality: N/A;  3:00  . Orif periprosthetic fracture  09/08/2012    Procedure: OPEN REDUCTION INTERNAL FIXATION (ORIF) PERIPROSTHETIC FRACTURE;  Surgeon: Shelda Pal, MD;  Location: [facility];  Service: Orthopedics;  Laterality: Right;  ORIF periprosthetic right proximal femur fracture Spanning external fixator left distal tibia  . External fixation leg  09/08/2012    Procedure: EXTERNAL FIXATION LEG;  Surgeon: Shelda Pal, MD;  Location: [facility];  Service: Orthopedics;  Laterality: Left;  . Joint replacement  bilateral knees   . External fixation leg  11/30/2012    Procedure: EXTERNAL FIXATION LEG;  Surgeon: Shelda Pal, MD;  Location: [facility];  Service: Orthopedics;  Laterality: Left;  Removal of External Fixation Left Knee with Evaluation with Floroscopy    Family History:  Family History  Problem Relation Age of Onset  . Colon cancer Brother    Social History:  reports that she has never smoked. She has never used smokeless tobacco. She reports that she does not drink alcohol or use illicit drugs.  Allergies:  Allergies  Allergen Reactions  . Morphine Sulfate Other (See Comments)    REACTION: change in personality  . Remeron (Mirtazapine) Other (See Comments)    Altered mental status, lethargy  . Tuna (Fish Allergy) Other (See Comments)    unknown  . Penicillins Rash    Tolerates Ancef.    Home medications: Prior to Admission medications   Medication Sig Start Date End Date Taking? Authorizing Provider  busPIRone (BUSPAR) 5 MG tablet Take 5 mg by  mouth every 12 (twelve) hours.   Yes Historical Provider, MD  diphenhydrAMINE (BENADRYL) 25 mg capsule Take 25 mg by mouth every 8 (eight) hours as needed for itching.   Yes Historical Provider, MD  HYDROcodone-acetaminophen (NORCO/VICODIN) 5-325 MG per tablet Take 1 tablet by mouth every 4 (four) hours as needed for pain.   Yes Historical Provider, MD  Multiple Vitamin (MULTIVITAMIN WITH MINERALS) TABS Take 1 tablet by mouth daily.   Yes Historical Provider, MD  ondansetron (ZOFRAN) 4 MG tablet Take 4 mg by mouth every 6 (six) hours as needed for nausea.   Yes Historical Provider, MD  oxyCODONE (OXY IR/ROXICODONE) 5 MG immediate release tablet Take 5 mg by mouth every 4 (four) hours as needed for pain.   Yes Historical Provider, MD  simethicone (MYLICON) 80 MG chewable tablet Chew 80 mg by mouth every 6 (six) hours as needed for flatulence.   Yes Historical Provider, MD  spironolactone (ALDACTONE) 25 MG tablet Take 25 mg by mouth daily.   Yes Historical Provider, MD  torsemide (DEMADEX) 20 MG tablet Take 20 mg by mouth daily.   Yes Historical Provider, MD  vitamin C (ASCORBIC ACID) 500 MG tablet Take 500 mg by mouth every 12 (twelve) hours.   Yes Historical Provider, MD  warfarin (COUMADIN) 3 MG tablet Take 3 mg by mouth daily.   Yes Historical Provider, MD  ZINC OXIDE EX Apply 1 application topically daily.   Yes Historical Provider, MD  allopurinol (ZYLOPRIM) 100 MG tablet Take 100 mg by mouth daily.    Historical Provider, MD  amiodarone (PACERONE) 200 MG tablet Take 200 mg by mouth daily.     Historical Provider, MD  carvedilol (COREG) 25 MG tablet Take 25 mg by mouth 2 (two) times daily with a meal.    Historical Provider, MD  docusate sodium (COLACE) 100 MG capsule Take 100 mg by mouth every 8 (eight) hours.     Historical Provider, MD  guaiFENesin (MUCINEX) 600 MG 12 hr tablet Take 600 mg by mouth 2 (two) times daily.     Historical Provider, MD  hydrALAZINE (APRESOLINE) 25 MG tablet Take 25  mg by mouth 3 (three) times daily.    Historical Provider, MD  insulin aspart (NOVOLOG) 100 UNIT/ML injection Inject 1-5 Units into the skin 3 (three) times daily with meals. 150-200=1unit,201-250=2units,251-300=3units,301-350=4units,351-400=5units if over 400 call MD 09/15/12   Gwenyth Bender, NP  insulin glargine (LANTUS) 100 UNIT/ML  injection Inject 5 Units into the skin at bedtime.    Historical Provider, MD  isosorbide dinitrate (ISORDIL) 20 MG tablet Take 20 mg by mouth every 8 (eight) hours.     Historical Provider, MD  nitroGLYCERIN (NITROSTAT) 0.4 MG SL tablet Place 0.4 mg under the tongue every 5 (five) minutes as needed. Chest pain    Historical Provider, MD  oxyCODONE (OXYCONTIN) 10 MG 12 hr tablet Take 10 mg by mouth every 12 (twelve) hours.    Historical Provider, MD  pantoprazole (PROTONIX) 40 MG tablet Take 40 mg by mouth daily.    Historical Provider, MD  polyethylene glycol (MIRALAX / GLYCOLAX) packet Take 17 g by mouth 2 (two) times daily.     Historical Provider, MD  traZODone (DESYREL) 50 MG tablet Take 50 mg by mouth at bedtime.    Historical Provider, MD  Vitamin D, Ergocalciferol, (DRISDOL) 50000 UNITS CAPS Take 50,000 Units by mouth every 7 (seven) days.    Historical Provider, MD  vitamin k 100 MCG tablet Take 100 mcg by mouth daily.    Historical Provider, MD    Labs: Basic Metabolic Panel:  Recent Labs Lab 01/02/13 1511 01/03/13 0800  NA 141 140  K 3.9 3.8  CL 93* 91*  CO2 41* 40*  GLUCOSE 122* 154*  BUN 43* 43*  CREATININE 1.65* 1.62*  CALCIUM 10.1 10.0  PHOS  --  3.4   Liver Function Tests:  Recent Labs Lab 01/03/13 0800  AST 20  ALT 12  ALKPHOS 163*  BILITOT 0.8  PROT 7.1  ALBUMIN 4.2   No results found for this basename: LIPASE, AMYLASE,  in the last 168 hours No results found for this basename: AMMONIA,  in the last 168 hours CBC:  Recent Labs Lab 01/02/13 1511 01/03/13 0800  WBC 5.6 6.1  NEUTROABS 4.3  --   HGB 10.4* 10.3*  HCT  33.9* 34.0*  MCV 88.3 89.0  PLT 95* 99*   PT/INR: @LABRCNTIP (inr:5) Cardiac Enzymes: ) Recent Labs Lab 01/02/13 2354 01/03/13 0800 01/03/13 1135  TROPONINI <0.30 <0.30 <0.30   CBG:  Recent Labs Lab 01/02/13 2156 01/02/13 2300 01/03/13 0748  GLUCAP 128* 103* 131*     Physical Exam:  Blood pressure 139/79, pulse 96, temperature 98.1 F (36.7 C), temperature source Oral, resp. rate 18, height 5\' 3"  (1.6 m), weight 117.5 kg (259 lb 0.7 oz), SpO2 97.00%.  Gen: alert, elderly pleasant female Skin: no rash, cyanosis HEENT:  EOMI, sclera anicteric, throat clear Neck: ++ JVD, no LAN Chest: bilateral basilar rales CV: regular, no rub or gallop, no carotid bruits Abdomen: soft, obese, marked abd wall edema in lateral areas Ext: 2-3+ pretibial and thigh edema bilat, no joint effusion or deformity, no gangrene or ulceration Neuro: alert, Ox3, no focal deficit, no asterixis   Impression/Plan 1. Anasarca- no proteinuria, severe renal failure or cirrhosis (albumin 4). +high serum HCO3 40, CO2 retention long-term and morbid obesity >> anasarca is due to either severe diastolic heart failure, obesity-hypoventiliation syndrome (OHS= Pickwickian syndrome =RHF/pulm HTN), or both.  Recommend maximum diuretics w lasix 160 IV q 6 hrs + zaroxlyn.  Poor candidate for RRT with comorbidities.  2. CKD III, creat 1.6 3. Morbid obesity 4. HTN- for decades 5. Limited code  Will follow.  Vinson Moselle  MD [employer] [phone] pgr    [phone] cell 01/03/2013, 1:49 PM

## 2013-01-03 NOTE — Progress Notes (Addendum)
ANTICOAGULATION CONSULT NOTE - Initial Consult  Pharmacy Consult for Coumadin Indication: atrial fibrillation  Allergies  Allergen Reactions  . Morphine Sulfate Other (See Comments)    REACTION: change in personality  . Remeron (Mirtazapine) Other (See Comments)    Altered mental status, lethargy  . Tuna (Fish Allergy) Other (See Comments)    unknown  . Penicillins Rash    Tolerates Ancef.    Patient Measurements: Height: 5\' 3"  (160 cm) Weight: 259 lb 0.7 oz (117.5 kg) IBW/kg (Calculated) : 52.4  Vital Signs: Temp: 98.1 F (36.7 C) (03/01 2304) Temp src: Oral (03/01 2304) BP: 138/89 mmHg (03/01 2304) Pulse Rate: 114 (03/01 2304)  Labs:  Recent Labs  01/02/13 1511 01/02/13 2354  HGB 10.4*  --   HCT 33.9*  --   PLT 95*  --   LABPROT  --  19.9*  INR  --  1.76*  CREATININE 1.65*  --   TROPONINI  --  <0.30    Estimated Creatinine Clearance: 32 ml/min (by C-G formula based on Cr of 1.65).   Medical History: Past Medical History  Diagnosis Date  . GERD (gastroesophageal reflux disease)   . Back pain, chronic   . Pain in shoulder   . Pain in ankle   . Hiatal hernia   . HTN (hypertension)     all her life  . CKD (chronic kidney disease), stage III     GFR: 38  . Atrial fibrillation     since 1996  . Sleep apnea   . Fall 3 weeks ago    was evaluated at [facility]  . OSA on CPAP   . Depression   . PONV (postoperative nausea and vomiting)     difficult to wake up  . Chronic diastolic heart failure 08/31/2012  . Tibia fracture     BILATERAL  . Renal failure     acute on chronic   . Fibula fracture     bilateral  . Respiratory failure     acute on chronic hx of  requiring BIPAP  . COPD (chronic obstructive pulmonary disease)   . Metabolic encephalopathy     hx of   . Renal failure     acute on chronic with need for intermittent dialysis - hx of   . UTI (urinary tract infection)     hx of   . Pleural effusion     right sided now resolved   .  Diabetes mellitus     x 15 yrs, hx of hypoglycemia   . CHF (congestive heart failure)     diastolic HF    Medications:  Prescriptions prior to admission  Medication Sig Dispense Refill  . busPIRone (BUSPAR) 5 MG tablet Take 5 mg by mouth every 12 (twelve) hours.      . diphenhydrAMINE (BENADRYL) 25 mg capsule Take 25 mg by mouth every 8 (eight) hours as needed for itching.      Marland Kitchen HYDROcodone-acetaminophen (NORCO/VICODIN) 5-325 MG per tablet Take 1 tablet by mouth every 4 (four) hours as needed for pain.      . Multiple Vitamin (MULTIVITAMIN WITH MINERALS) TABS Take 1 tablet by mouth daily.      . ondansetron (ZOFRAN) 4 MG tablet Take 4 mg by mouth every 6 (six) hours as needed for nausea.      Marland Kitchen oxyCODONE (OXY IR/ROXICODONE) 5 MG immediate release tablet Take 5 mg by mouth every 4 (four) hours as needed for pain.      Marland Kitchen  simethicone (MYLICON) 80 MG chewable tablet Chew 80 mg by mouth every 6 (six) hours as needed for flatulence.      Marland Kitchen spironolactone (ALDACTONE) 25 MG tablet Take 25 mg by mouth daily.      Marland Kitchen torsemide (DEMADEX) 20 MG tablet Take 20 mg by mouth daily.      . vitamin C (ASCORBIC ACID) 500 MG tablet Take 500 mg by mouth every 12 (twelve) hours.      Marland Kitchen warfarin (COUMADIN) 3 MG tablet Take 3 mg by mouth daily.      Marland Kitchen ZINC OXIDE EX Apply 1 application topically daily.      Marland Kitchen allopurinol (ZYLOPRIM) 100 MG tablet Take 100 mg by mouth daily.      Marland Kitchen amiodarone (PACERONE) 200 MG tablet Take 200 mg by mouth daily.       . carvedilol (COREG) 25 MG tablet Take 25 mg by mouth 2 (two) times daily with a meal.      . docusate sodium (COLACE) 100 MG capsule Take 100 mg by mouth every 8 (eight) hours.       Marland Kitchen guaiFENesin (MUCINEX) 600 MG 12 hr tablet Take 600 mg by mouth 2 (two) times daily.       . hydrALAZINE (APRESOLINE) 25 MG tablet Take 25 mg by mouth 3 (three) times daily.      . insulin aspart (NOVOLOG) 100 UNIT/ML injection Inject 1-5 Units into the skin 3 (three) times daily with  meals. 150-200=1unit,201-250=2units,251-300=3units,301-350=4units,351-400=5units if over 400 call MD      . insulin glargine (LANTUS) 100 UNIT/ML injection Inject 5 Units into the skin at bedtime.      . isosorbide dinitrate (ISORDIL) 20 MG tablet Take 20 mg by mouth every 8 (eight) hours.       . nitroGLYCERIN (NITROSTAT) 0.4 MG SL tablet Place 0.4 mg under the tongue every 5 (five) minutes as needed. Chest pain      . oxyCODONE (OXYCONTIN) 10 MG 12 hr tablet Take 10 mg by mouth every 12 (twelve) hours.      . pantoprazole (PROTONIX) 40 MG tablet Take 40 mg by mouth daily.      . polyethylene glycol (MIRALAX / GLYCOLAX) packet Take 17 g by mouth 2 (two) times daily.       . traZODone (DESYREL) 50 MG tablet Take 50 mg by mouth at bedtime.      . Vitamin D, Ergocalciferol, (DRISDOL) 50000 UNITS CAPS Take 50,000 Units by mouth every 7 (seven) days.      . vitamin k 100 MCG tablet Take 100 mcg by mouth daily.       Scheduled:  . allopurinol  100 mg Oral Daily  . amiodarone  200 mg Oral Daily  . busPIRone  5 mg Oral Q12H  . carvedilol  25 mg Oral BID WC  . docusate sodium  100 mg Oral Q8H  . furosemide  120 mg Intravenous Q6H  . guaiFENesin  600 mg Oral BID  . hydrALAZINE  25 mg Oral TID  . insulin aspart  0-5 Units Subcutaneous QHS  . insulin aspart  0-9 Units Subcutaneous TID WC  . insulin glargine  5 Units Subcutaneous QHS  . ipratropium  0.5 mg Nebulization Q6H  . isosorbide dinitrate  20 mg Oral Q8H  . OxyCODONE  10 mg Oral Q12H  . [COMPLETED] oxyCODONE-acetaminophen  2 tablet Oral Once  . pantoprazole  40 mg Oral Daily  . polyethylene glycol  17 g Oral BID  .  sodium chloride  3 mL Intravenous Q12H  . spironolactone  25 mg Oral Daily  . traZODone  50 mg Oral QHS  . vitamin k  100 mcg Oral Daily  . warfarin  5 mg Oral NOW  . Warfarin - Pharmacist Dosing Inpatient   Does not apply q1800    Assessment: 79yo female comes [facility] with staff stating need for emergent HD  though may only require diuresis, to continue Coumadin for Afib; admitted with subtherapeutic INR.  Goal of Therapy:  INR 2-3   Plan:  Will give boosted dose of Coumadin 5mg  po x1 now and monitor daily INR for dose adjustments.  Vernard Gambles, PharmD, BCPS  01/03/2013,1:06 AM  -------------------------------------------------------------------------- Addendum: INR this am is still subtherapeutic at 1.86 after a 5mg  dose early this morning. The effects of the Coumadin will take a couple of days.   Plan: - Coumadin 5mg  po x 1 dose tonight - Daily INR - Monitor for signs and symptoms of bleeding   Thank you, Franchot Erichsen, Pharm.D. Clinical Pharmacist   Pager: [phone] 01/03/2013 10:41 AM

## 2013-01-03 NOTE — Care Management (Signed)
CARE MANAGEMENT NOTE 01/03/2013  Patient:  [facility]   Account Number:  [id number]  Date Initiated:  01/03/2013  Documentation initiated by:  DAVIS,TYMEEKA  Subjective/Objective Assessment:   79 yo female admitted with diastolic heart failure. PTA pt at [facility].     Action/Plan:   Home with HH services.   Anticipated DC Date:     Anticipated DC Plan:  HOME W HOME HEALTH SERVICES      DC Planning Services  CM consult      Choice offered to / List presented to:  C-1 Patient           [facility] agency  [facility]   Status of service:  In process, will continue to follow Medicare Important Message given?   (If response is "NO", the following Medicare IM given date fields will be blank) Date Medicare IM given:   Date Additional Medicare IM given:    Discharge Disposition:    Per UR Regulation:    If discussed at Long Length of Stay Meetings, dates discussed:    Comments:  01/03/13 1530 Tymeeka Davis,RN,BSN [phone] Cm spoke with patient concerning discharge planning with adult daughter at bedside. Pt states does not want to return to [facility] upon discharge. Pt offered choice for [facility]. Per pt choice [facility] to provide [facility] services upon discharge. Pt request wheelchair and [facility] upon discharge. Awaiting MD orders.

## 2013-01-03 NOTE — Progress Notes (Signed)
MD ordered bipap for patient.  Patient wears Bipap at [facility].  RT called therapist from [facility] to find out patients settings.  Patients wears bipap 10/5 30% with a nasal mask there.  RT placed patient on an ST Bipap set at 10/5 with 2L oxygen bled in.  Patient is wearing a nasal mask here as well.  Patient seems to be tolerating well at this time.  RT will continue to monitor patient.

## 2013-01-03 NOTE — Progress Notes (Signed)
[facility] PROGRESS NOTE  Gina Ashley ZOX:[id number] DOB: December 05, 1926 DOA: 01/02/2013 PCP: Ernestine Conrad, MD  Brief narrative: 79 y.o. female with past medical history of CKD stage 3, requiring short term HD in past, atrial fibrillation (on coumadin), chronic back and shoulder pain, GERD, COPD on oxygen, sleep apnea on CPAP, chronic diastolic CHF, DM and numerous lower extremity fractures who presented to ED 01/02/2013 due to worsening generalized swelling and shortness of breath over past week prior to this admission.  Assessment/Plan:  Principal Problem:   *Shortness of breath  Likely combination of diastolic CHF, fluid overload, renal failure and COPD  Will continue lasix 120 mg IV Q 6 hours, monitor intake and output. Since admission, patient is about negative 2 L fluid balance. Will follow up with renal if HD is planned.  Continue Atrovent nebulizer scheduled Q 6 hours; albuterol PRN Q 4 hours Active Problems:   DM with complications (CKD)  Follow up A1c  Continue sliding scale insulin  Insulin Glargine 5 units Q HS   OBSTRUCTIVE SLEEP APNEA  On CPAP   HYPERTENSION  Continue hydralazine 25 mg PO TID, isordil 20 mg O Q 8 Hours   GERD  Continue protonix   Atrial fibrillation  Coumadin per pharmacy  Continue amiodarone and coreg   COPD (chronic obstructive pulmonary disease)  Nebulizers: Atrovent scheduled and albuterol PRN   Anemia due to chronic illness  Secondary to CKD  Hemoglobin stable at 10.3   CKD (chronic kidney disease)  Creatinine 1.92 in 11/2012 and now 1.62  Appreciate renal following   Chronic diastolic heart failure  BNP 4076 on this admission  Continue lasix 120 mg Q 6 hours IV  2 D ECHO in 08/2012 with EF 60%; follow up 2 D ECHO on this admission   Obesity (BMI 30-39.9)  Nutrition consulted   Depression  Continue buspirone   Code Status: partial code blue Family Communication: no family at bedside Disposition Plan: home or SNF  when stable  [name], MD  [facility] Pager [phone]  If 7PM-7AM, please contact night-coverage [email or web address] Password [email or web address] 01/03/2013, 12:17 PM   LOS: 1 day   Consultants:  Nephrology   Procedures:  None   Antibiotics:  None   HPI/Subjective: No acute overnight events.  Objective: Filed Vitals:   01/03/13 0248 01/03/13 0454 01/03/13 0851 01/03/13 0952  BP:  130/78  139/79  Pulse: 112 96  96  Temp:  98.9 F (37.2 C)  98.1 F (36.7 C)  TempSrc:  Oral  Oral  Resp: 18 16  18   Height:      Weight:      SpO2: 98% 95% 5% 97%    Intake/Output Summary (Last 24 hours) at 01/03/13 1217 Last data filed at 01/03/13 0953  Gross per 24 hour  Intake    320 ml  Output   2350 ml  Net  -2030 ml    Exam:   General:  Pt is alert, follows commands appropriately, not in acute distress  Cardiovascular: irregular rhythm, rate controlled, S1/S2, no murmurs, no rubs, no gallops  Respiratory: crackles at bases, no wheezing  Abdomen: Soft, non tender, non distended, bowel sounds present, no guarding  Extremities: LE (+2-3) pitting, pulses DP and PT palpable bilaterally  Neuro: Grossly nonfocal  Data Reviewed: Basic Metabolic Panel:  Recent Labs Lab 01/02/13 1511 01/03/13 0800  NA 141 140  K 3.9 3.8  CL 93* 91*  CO2 41* 40*  GLUCOSE 122* 154*  BUN 43* 43*  CREATININE  1.65* 1.62*  CALCIUM 10.1 10.0  MG  --  2.0  PHOS  --  3.4   Liver Function Tests:  Recent Labs Lab 01/03/13 0800  AST 20  ALT 12  ALKPHOS 163*  BILITOT 0.8  PROT 7.1  ALBUMIN 4.2   No results found for this basename: LIPASE, AMYLASE,  in the last 168 hours No results found for this basename: AMMONIA,  in the last 168 hours CBC:  Recent Labs Lab 01/02/13 1511 01/03/13 0800  WBC 5.6 6.1  NEUTROABS 4.3  --   HGB 10.4* 10.3*  HCT 33.9* 34.0*  MCV 88.3 89.0  PLT 95* 99*   Cardiac Enzymes:  Recent Labs Lab 01/02/13 2354 01/03/13 0800  TROPONINI <0.30 <0.30   BNP: No  components found with this basename: POCBNP,  CBG:  Recent Labs Lab 01/02/13 2156 01/02/13 2300 01/03/13 0748  GLUCAP 128* 103* 131*    MRSA PCR SCREENING     Status: None   Collection Time    01/03/13  1:03 AM      Result Value Range Status   MRSA by PCR NEGATIVE  NEGATIVE Final     Studies: Dg Chest 2 View 01/02/2013  * IMPRESSION: Cardiomegaly with mild interstitial edema and small left pleural effusion, unchanged.  Moderate right pleural effusion, increased.  .     Scheduled Meds: . allopurinol  100 mg Oral Daily  . amiodarone  200 mg Oral Daily  . busPIRone  5 mg Oral Q12H  . carvedilol  25 mg Oral BID WC  . docusate sodium  100 mg Oral Q8H  . furosemide  120 mg Intravenous Q6H  . guaiFENesin  600 mg Oral BID  . hydrALAZINE  25 mg Oral TID  . insulin aspart  0-5 Units Subcutaneous QHS  . insulin aspart  0-9 Units Subcutaneous TID WC  . insulin glargine  5 Units Subcutaneous QHS  . ipratropium  0.5 mg Nebulization Q6H  . isosorbide dinitrate  20 mg Oral Q8H  . OxyCODONE  10 mg Oral Q12H  . pantoprazole  40 mg Oral Daily  . polyethylene glycol  17 g Oral BID  . spironolactone  25 mg Oral Daily  . traZODone  50 mg Oral QHS  . vitamin k  100 mcg Oral Daily  . warfarin  5 mg Oral ONCE-1800

## 2013-01-03 NOTE — ED Provider Notes (Signed)
I saw and evaluated the patient, reviewed the resident's note and I agree with the findings and plan.   .Face to face Exam:  General:  Awake HEENT:  Atraumatic Resp:  Normal effort Abd:  Nondistended Neuro:No focal weakness Lymph: No adenopathy   Nelia Shi, MD 01/03/13 [phone]

## 2013-01-04 LAB — CBC
Hemoglobin: 9.5 g/dL — ABNORMAL LOW (ref 12.0–15.0)
MCV: 89.5 fL (ref 78.0–100.0)
Platelets: 84 10*3/uL — ABNORMAL LOW (ref 150–400)
RBC: 3.54 MIL/uL — ABNORMAL LOW (ref 3.87–5.11)
WBC: 6 10*3/uL (ref 4.0–10.5)

## 2013-01-04 LAB — BASIC METABOLIC PANEL
CO2: 40 mEq/L (ref 19–32)
Calcium: 9.7 mg/dL (ref 8.4–10.5)
Chloride: 93 mEq/L — ABNORMAL LOW (ref 96–112)
Glucose, Bld: 155 mg/dL — ABNORMAL HIGH (ref 70–99)
Potassium: 3.5 mEq/L (ref 3.5–5.1)
Sodium: 139 mEq/L (ref 135–145)

## 2013-01-04 LAB — BLOOD GAS, ARTERIAL
Drawn by: 225631
O2 Content: 2.5 L/min
O2 Saturation: 96.8 %
Patient temperature: 98.6
pH, Arterial: 7.382 (ref 7.350–7.450)
pO2, Arterial: 87.2 mmHg (ref 80.0–100.0)

## 2013-01-04 LAB — PROTIME-INR
INR: 2.16 — ABNORMAL HIGH (ref 0.00–1.49)
Prothrombin Time: 23.2 seconds — ABNORMAL HIGH (ref 11.6–15.2)

## 2013-01-04 LAB — GLUCOSE, CAPILLARY: Glucose-Capillary: 123 mg/dL — ABNORMAL HIGH (ref 70–99)

## 2013-01-04 MED ORDER — METOLAZONE 10 MG PO TABS
10.0000 mg | ORAL_TABLET | Freq: Every day | ORAL | Status: DC
  Administered 2013-01-05 – 2013-01-09 (×5): 10 mg via ORAL
  Filled 2013-01-04 (×5): qty 1

## 2013-01-04 MED ORDER — WARFARIN SODIUM 4 MG PO TABS
4.0000 mg | ORAL_TABLET | Freq: Once | ORAL | Status: AC
  Administered 2013-01-04: 4 mg via ORAL
  Filled 2013-01-04 (×2): qty 1

## 2013-01-04 NOTE — Progress Notes (Addendum)
Pt foley catheter changed per protocol. Pt tolerated well. No complaints of pain or discomfort.MD, Dr. Dierdre Searles, paged to notify of bladder scan result , with call back. Dondra Spry

## 2013-01-04 NOTE — Care Management Note (Signed)
   CARE MANAGEMENT NOTE 01/04/2013  Patient:  [facility]   Account Number:  [id number]  Date Initiated:  01/03/2013  Documentation initiated by:  DAVIS,TYMEEKA  Subjective/Objective Assessment:   79 yo female admitted with diastolic heart failure. PTA pt at [facility].     Action/Plan:   Home with HH services.   Anticipated DC Date:     Anticipated DC Plan:  HOME W HOME HEALTH SERVICES      DC Planning Services  CM consult      Choice offered to / List presented to:  C-1 Patient           [facility] agency  [facility]   Status of service:  In process, will continue to follow Medicare Important Message given?   (If response is "NO", the following Medicare IM given date fields will be blank) Date Medicare IM given:   Date Additional Medicare IM given:    Discharge Disposition:    Per UR Regulation:    If discussed at Long Length of Stay Meetings, dates discussed:    Comments:  01/04/2013 Met with pt and daughter, plan is to d/c to another SNF, unclear about plan for d/c to home. Per daughter and pt plan today is to d/c to another SNF if available at time of d/c . This CM explained that if pt requires SNF and is ready for d/c and the CSW has no other offers than pt will need to return to [facility], for skilled care and her daughter can continue search for another SNF after d/c.  Noted previous CM notes, however neither pt nor daughter indicated a plan to d/c to home at this time. Johny Shock RN MPH  [phone]  01/03/13 1530 Leonie Green [phone] Cm spoke with patient concerning discharge planning with adult daughter at bedside. Pt states does not want to return to [facility] upon discharge. Pt offered choice for [facility]. Per pt choice [facility] to provide [facility] services upon discharge. Pt request wheelchair and [facility] upon discharge. Awaiting MD orders.

## 2013-01-04 NOTE — Progress Notes (Signed)
[facility] PROGRESS NOTE  Gina Ashley UJW:[id number] DOB: July 02, 1927 DOA: 01/02/2013 PCP: Ernestine Conrad, MD  Brief narrative: 79 y.o. female with past medical history of CKD stage 3, requiring short term HD in past, atrial fibrillation (on coumadin), chronic back and shoulder pain, GERD, COPD on oxygen, sleep apnea on CPAP, chronic diastolic CHF, DM and numerous lower extremity fractures who presented to ED 01/02/2013 due to worsening generalized swelling and shortness of breath over past week prior to this admission. Nephrology is assisting the management. For now, plan is to continue lasix at the current dosage 160 mg Q 6 hours IV and monitor urine output.  Assessment/Plan:   Principal Problem:  *Shortness of breath  Likely combination of diastolic CHF, fluid overload, renal failure and COPD  Increase lasix 160 mg IV Q 6 hours per renal recomendation, monitor intake and output. In past 24 hours urine output 1.2 L. Continue Atrovent nebulizer scheduled Q 6 hours; albuterol PRN Q 4 hours Active Problems:  DM with complications (CKD)  Follow up A1c  Continue sliding scale insulin  Insulin Glargine 5 units Q HS OBSTRUCTIVE SLEEP APNEA  On CPAP HYPERTENSION  Per renal recommendations we will hydralazine 25 mg PO TID Continue isordil 20 mg O Q 8 Hours GERD  Continue protonix Atrial fibrillation  Coumadin per pharmacy  Continue amiodarone and coreg INR therapeutic at 2.16 COPD (chronic obstructive pulmonary disease)  Nebulizers: Atrovent scheduled and albuterol PRN Anemia due to chronic illness  Secondary to CKD  Hemoglobin stable at 9.5 CKD (chronic kidney disease)  Creatinine 1.92 in 11/2012  Trended up from yesterday 1.62 to 1.86 today Appreciate renal following Chronic diastolic heart failure  BNP 4076 on this admission  Continue lasix 160 mg Q 6 hours IV  2 D ECHO in 08/2012 with EF 60%; follow up 2 D ECHO on this admission Obesity (BMI 30-39.9)  Nutrition  consulted Depression  Continue buspirone   Code Status: partial code blue  Family Communication: no family at bedside  Disposition Plan: home or SNF when stable   [name], MD  [facility]  Pager [phone]   Consultants:  Nephrology  Procedures:  None  Antibiotics:  None   If 7PM-7AM, please contact night-coverage [email or web address] Password [facility] 01/04/2013, 12:59 PM   LOS: 2 days    HPI/Subjective: No acute overnight events.  Objective: Filed Vitals:   01/04/13 0441 01/04/13 0900 01/04/13 0929 01/04/13 1005  BP: 102/68  166/92   Pulse: 108  82   Temp: 97.4 F (36.3 C)  98 F (36.7 C)   TempSrc: Oral  Oral   Resp: 18  18   Height:      Weight:  118.6 kg (261 lb 7.5 oz)    SpO2: 93%  96% 96%    Intake/Output Summary (Last 24 hours) at 01/04/13 1259 Last data filed at 01/04/13 1038  Gross per 24 hour  Intake    906 ml  Output   1251 ml  Net   -345 ml    Exam:   General:  Pt is alert, follows commands appropriately, not in acute distress  Cardiovascular: Regular rate and rhythm, S1/S2 appreciated  Respiratory: Clear to auscultation bilaterally, no wheezing  Abdomen: Soft, obese, non tender, non distended, bowel sounds present  Extremities: (+3) LE pitting edema, pulses DP and PT palpable bilaterally  Neuro: Grossly nonfocal  Data Reviewed: Basic Metabolic Panel:  Recent Labs Lab 01/02/13 1511 01/03/13 0800 01/04/13 0626  NA 141 140 139  K 3.9 3.8  3.5  CL 93* 91* 93*  CO2 41* 40* 40*  GLUCOSE 122* 154* 155*  BUN 43* 43* 47*  CREATININE 1.65* 1.62* 1.86*  CALCIUM 10.1 10.0 9.7  MG  --  2.0  --   PHOS  --  3.4  --    Liver Function Tests:  Recent Labs Lab 01/03/13 0800  AST 20  ALT 12  ALKPHOS 163*  BILITOT 0.8  PROT 7.1  ALBUMIN 4.2   CBC:  Recent Labs Lab 01/02/13 1511 01/03/13 0800 01/04/13 0626  WBC 5.6 6.1 6.0  NEUTROABS 4.3  --   --   HGB 10.4* 10.3* 9.5*  HCT 33.9* 34.0* 31.7*  MCV 88.3 89.0 89.5  PLT 95* 99* 84*    Cardiac Enzymes:  Recent Labs Lab 01/02/13 2354 01/03/13 0800 01/03/13 1135  TROPONINI <0.30 <0.30 <0.30   BNP: No components found with this basename: POCBNP,  CBG:  Recent Labs Lab 01/03/13 1158 01/03/13 1701 01/03/13 2058 01/04/13 0743 01/04/13 1121  GLUCAP 167* 168* 226* 128* 138*    MRSA PCR SCREENING     Status: None   Collection Time    01/03/13  1:03 AM      Result Value Range Status   MRSA by PCR NEGATIVE  NEGATIVE Final  URINE CULTURE     Status: None   Collection Time    01/03/13  3:06 AM      Result Value Range Status   Specimen Description URINE, CATHETERIZED   Final   Special Requests ADDED 0337   Final   Culture  Setup Time 01/03/2013 03:54   Final   Colony Count >=100,000 COLONIES/ML   Final   Culture GRAM NEGATIVE RODS   Final   Report Status PENDING   Incomplete     Studies: Dg Chest 2 View 01/02/2013  * IMPRESSION: Cardiomegaly with mild interstitial edema and small left pleural effusion, unchanged.  Moderate right pleural effusion, increased.   Original Report Authenticated By: Charline Bills, M.D.     Scheduled Meds: . allopurinol  100 mg Oral Daily  . amiodarone  200 mg Oral Daily  . busPIRone  5 mg Oral Q12H  . carvedilol  25 mg Oral BID WC  . docusate sodium  100 mg Oral Q8H  . furosemide  160 mg Intravenous Q6H  . guaiFENesin  600 mg Oral BID  . insulin aspart  0-5 Units Subcutaneous QHS  . insulin aspart  0-9 Units Subcutaneous TID WC  . insulin glargine  5 Units Subcutaneous QHS  . ipratropium  0.5 mg Nebulization TID  . isosorbide dinitrate  20 mg Oral Q8H  .  metolazone  10 mg Oral Daily  . OxyCODONE  10 mg Oral Q12H  . pantoprazole  40 mg Oral Daily  . polyethylene glycol  17 g Oral BID  . sodium chloride  3 mL Intravenous Q12H  . spironolactone  25 mg Oral Daily  . traZODone  50 mg Oral QHS  . Warfarin   Does not apply q1800

## 2013-01-04 NOTE — Progress Notes (Signed)
Placed patient on BIPAP for the night with IPAP set at 10cm with EPAP set at 5cm. Oxygen set at 2lpm.

## 2013-01-04 NOTE — Progress Notes (Signed)
ANTICOAGULATION CONSULT NOTE - Follow Up Consult  Pharmacy Consult for Warfarin  Indication: atrial fibrillation  Allergies  Allergen Reactions  . Morphine Sulfate Other (See Comments)    REACTION: change in personality  . Remeron (Mirtazapine) Other (See Comments)    Altered mental status, lethargy  . Tuna (Fish Allergy) Other (See Comments)    unknown  . Penicillins Rash    Tolerates Ancef.    Patient Measurements: Height: 5\' 3"  (160 cm) Weight: 261 lb 7.5 oz (118.6 kg) IBW/kg (Calculated) : 52.4  Vital Signs: Temp: 98 F (36.7 C) (03/03 0929) Temp src: Oral (03/03 0929) BP: 166/92 mmHg (03/03 0929) Pulse Rate: 82 (03/03 0929)  Labs:  Recent Labs  01/02/13 1511 01/02/13 2354 01/03/13 0800 01/03/13 1135 01/04/13 0626  HGB 10.4*  --  10.3*  --  9.5*  HCT 33.9*  --  34.0*  --  31.7*  PLT 95*  --  99*  --  84*  LABPROT  --  19.9* 20.4*  --  23.2*  INR  --  1.76* 1.82*  --  2.16*  CREATININE 1.65*  --  1.62*  --  1.86*  TROPONINI  --  <0.30 <0.30 <0.30  --     Estimated Creatinine Clearance: 28.5 ml/min (by C-G formula based on Cr of 1.86).   Medications:  Scheduled:  . allopurinol  100 mg Oral Daily  . amiodarone  200 mg Oral Daily  . busPIRone  5 mg Oral Q12H  . carvedilol  25 mg Oral BID WC  . docusate sodium  100 mg Oral Q8H  . furosemide  160 mg Intravenous Q6H  . guaiFENesin  600 mg Oral BID  . insulin aspart  0-5 Units Subcutaneous QHS  . insulin aspart  0-9 Units Subcutaneous TID WC  . insulin glargine  5 Units Subcutaneous QHS  . ipratropium  0.5 mg Nebulization TID  . isosorbide dinitrate  20 mg Oral Q8H  . [START ON 01/05/2013] metolazone  10 mg Oral Daily  . OxyCODONE  10 mg Oral Q12H  . pantoprazole  40 mg Oral Daily  . polyethylene glycol  17 g Oral BID  . sodium chloride  3 mL Intravenous Q12H  . spironolactone  25 mg Oral Daily  . traZODone  50 mg Oral QHS  . [COMPLETED] warfarin  5 mg Oral ONCE-1800  . Warfarin - Pharmacist Dosing  Inpatient   Does not apply q1800  . [DISCONTINUED] furosemide  120 mg Intravenous Q6H  . [DISCONTINUED] hydrALAZINE  25 mg Oral TID  . [DISCONTINUED] ipratropium  0.5 mg Nebulization Q6H  . [DISCONTINUED] metolazone  5 mg Oral Daily    Assessment: 79 y/o F who continues of warfarin for Afib. INR on admit was sub-therapeutic at 1.76. INR today is 2.16<1.82 after two doses of warfarin 5mg . H/H is low and stable, no bleeding reported. Scr 1.86, CrCl ~28.10mL/min. Noted DDI with amiodarone.   Goal of Therapy:  INR 2-3 Monitor platelets by anticoagulation protocol: Yes   Plan:   -Warfarin 4mg  PO x 1 tonight at 1800 -Daily PT/INR -Monitor for bleeding  Thank you for the consult,   Abran Duke, PharmD Clinical Pharmacist Phone: [phone] Pager: [phone] 01/04/2013 1:40 PM

## 2013-01-04 NOTE — Progress Notes (Signed)
CRITICAL VALUE ALERT  Critical value received:  CO2 = 40  Date of notification:  01/04/13  Time of notification:  0733  Critical value read back:yes  Nurse who received alert:  K. Christell Constant, RN  MD notified (1st page):  Dr. Elisabeth Pigeon  Time of first page:  0800

## 2013-01-04 NOTE — Clinical Social Work Note (Signed)
Completed assessment with patient and daughter. Both agreeable to SNF. Patient information will be sent out 3/4 and full assessment to follow.  Genelle Bal, MSW, LCSW [phone]

## 2013-01-04 NOTE — Progress Notes (Signed)
  Echocardiogram 2D Echocardiogram has been performed.  Gina Ashley, Gina Ashley 01/04/2013, 10:02 AM

## 2013-01-04 NOTE — Progress Notes (Signed)
[facility] - PROGRESS NOTE Resident Note   Please see below for attending addendum to resident note.  Subjective:   The patient is a 79 y.o. year-old with hx of morbid obesity, HTN (all her life), CKD III, afib, OSA on CPAP and DM2 x 10-15 years presenting with marked anasarca to ED. She has been either in hospitals or SNF"s or LTAC's for the last 6 months. She fell and broke her R femur and L lower leg requiring surgical repair during this time. She has gotten temporary dialysis during this time, details not available. Creat is 1.6. She says swelling of abdomen and legs has been there for 7 days. Denies SOB, n/v/d, CP or cough, orthopnea. No fam hx of kidney disease. No NSAID"S. Non ambulatory  UA shows no protein, high pH 7.5  Serum HCO3 is 40  CXR bilat effusions +/- edema  ABG's from 2013 with pCO2 60-70 range  Creatinine in last 8 mos runs between 1.1- 2.7  Patient was started aggressive diuretic therapy Lasix 120 mg then increased to160 mg IV every 6 hour.  Metolazone 5 po daily was added to facilitate diuresis. Weight is not accurate.  unsatisfactory UOP ~1200 ml last 24 hours ( patient has foley catheter inserted since 12/01/12 from outside facility)   Objective:    Vital Signs:   Temp:  [97.3 F (36.3 C)-98.1 F (36.7 C)] 97.4 F (36.3 C) (03/03 0441) Pulse Rate:  [84-114] 108 (03/03 0441) Resp:  [18-20] 18 (03/03 0441) BP: (102-139)/(68-93) 102/68 mmHg (03/03 0441) SpO2:  [6 %-98 %] 93 % (03/03 0441) Weight:  [250 lb 8 oz (113.626 kg)] 250 lb 8 oz (113.626 kg) (03/02 2044) Last BM Date: 01/02/13  24-hour weight change: Weight change: -42 lb 8 oz (-19.278 kg)  Weight trends: Filed Weights   01/02/13 1854 01/02/13 2304 01/03/13 2044  Weight: 293 lb (132.904 kg) 259 lb 0.7 oz (117.5 kg) 250 lb 8 oz (113.626 kg)    Intake/Output:  03/02 0701 - 03/03 0700 In: 866 [P.O.:800; IV Piggyback:66] Out: 1251 [Urine:1250; Stool:1]  Physical Exam: Gen: alert,  elderly pleasant female  Skin: no rash, cyanosis  HEENT: EOMI, sclera anicteric, throat clear  Neck: ++ JVD ( she had PICC line and temporary HD catheter in her neck), no LAN  Chest: bilateral basilar rales  CV: regular, no rub or gallop, no carotid bruits  Abdomen: soft, obese, marked abd wall edema in lateral areas  Ext: 2-3+ pretibial and thigh edema bilat, no joint effusion or deformity, no gangrene or ulceration  Neuro: alert, Ox3, no focal deficit, no asterixis   Labs: Basic Metabolic Panel:  Recent Labs Lab 01/02/13 1511 01/03/13 0800 01/04/13 0626  NA 141 140 139  K 3.9 3.8 3.5  CL 93* 91* 93*  CO2 41* 40* 40*  GLUCOSE 122* 154* 155*  BUN 43* 43* 47*  CREATININE 1.65* 1.62* 1.86*  CALCIUM 10.1 10.0 9.7  MG  --  2.0  --   PHOS  --  3.4  --     Liver Function Tests:  Recent Labs Lab 01/03/13 0800  AST 20  ALT 12  ALKPHOS 163*  BILITOT 0.8  PROT 7.1  ALBUMIN 4.2   CBC:  Recent Labs Lab 01/02/13 1511 01/03/13 0800 01/04/13 0626  WBC 5.6 6.1 6.0  NEUTROABS 4.3  --   --   HGB 10.4* 10.3* 9.5*  HCT 33.9* 34.0* 31.7*  MCV 88.3 89.0 89.5  PLT 95* 99* 84*  Cardiac Enzymes:  Recent Labs Lab 01/02/13 2354 01/03/13 0800 01/03/13 1135  TROPONINI <0.30 <0.30 <0.30   Pro BNP  4706  CBG:  Recent Labs Lab 01/03/13 0748 01/03/13 1158 01/03/13 1701 01/03/13 2058 01/04/13 0743  GLUCAP 131* 167* 168* 226* 128*    Microbiology: Results for orders placed during the hospital encounter of 01/02/13  MRSA PCR SCREENING     Status: None   Collection Time    01/03/13  1:03 AM      Result Value Range Status   MRSA by PCR NEGATIVE  NEGATIVE Final   Comment:            The GeneXpert MRSA Assay (FDA     approved for NASAL specimens     only), is one component of a     comprehensive MRSA colonization     surveillance program. It is not     intended to diagnose MRSA     infection nor to guide or     monitor treatment for     MRSA infections.     Coagulation Studies:  Recent Labs  01/02/13 2354 01/03/13 0800 01/04/13 0626  LABPROT 19.9* 20.4* 23.2*  INR 1.76* 1.82* 2.16*    Urinalysis:  Recent Labs  01/03/13 0306  COLORURINE YELLOW  LABSPEC 1.010  PHURINE 6.0  GLUCOSEU NEGATIVE  HGBUR MODERATE*  BILIRUBINUR NEGATIVE  KETONESUR NEGATIVE  PROTEINUR NEGATIVE  UROBILINOGEN 0.2  NITRITE NEGATIVE  LEUKOCYTESUR MODERATE*      Imaging: Dg Chest 2 View  01/02/2013  *RADIOLOGY REPORT*  Clinical Data: Shortness of breath  CHEST - 2 VIEW  Comparison: 11/24/2012  Findings: Cardiomegaly with mild interstitial edema, stable. Moderate right pleural effusion, increased.  Small left pleural effusion, unchanged.  No pneumothorax.  Interval removal of right PICC and left chest dual lumen catheter.  Degenerative changes of the visualized thoracolumbar spine.  IMPRESSION: Cardiomegaly with mild interstitial edema and small left pleural effusion, unchanged.  Moderate right pleural effusion, increased.   Original Report Authenticated By: Charline Bills, M.D.       Medications:    Infusions:    Scheduled Medications: . allopurinol  100 mg Oral Daily  . amiodarone  200 mg Oral Daily  . busPIRone  5 mg Oral Q12H  . carvedilol  25 mg Oral BID WC  . docusate sodium  100 mg Oral Q8H  . furosemide  160 mg Intravenous Q6H  . guaiFENesin  600 mg Oral BID  . hydrALAZINE  25 mg Oral TID  . insulin aspart  0-5 Units Subcutaneous QHS  . insulin aspart  0-9 Units Subcutaneous TID WC  . insulin glargine  5 Units Subcutaneous QHS  . ipratropium  0.5 mg Nebulization TID  . isosorbide dinitrate  20 mg Oral Q8H  . metolazone  5 mg Oral Daily  . OxyCODONE  10 mg Oral Q12H  . pantoprazole  40 mg Oral Daily  . polyethylene glycol  17 g Oral BID  . sodium chloride  3 mL Intravenous Q12H  . spironolactone  25 mg Oral Daily  . traZODone  50 mg Oral QHS  . Warfarin - Pharmacist Dosing Inpatient   Does not apply q1800    PRN  Medications: sodium chloride, acetaminophen, albuterol, diphenhydrAMINE, HYDROcodone-acetaminophen, ondansetron (ZOFRAN) IV, oxyCODONE, simethicone, sodium chloride   Assessment/ Plan:    The patient is a 79 y.o. year-old with hx of morbid obesity, HTN (all her life), CKD III, afib, OSA on CPAP and DM2 x 10-15 years  presenting with marked anasarca to ED. She has been either in hospitals or SNF"s or LTAC's for the last 6 months. She fell and broke her R femur and L lower leg requiring surgical repair during this time. She has gotten temporary dialysis during this time, details not available. Creat is 1.6. She says swelling of abdomen and legs has been there for 7 days. Denies SOB, n/v/d, CP or cough, orthopnea. No fam hx of kidney disease. No NSAID"S. Non ambulatory. Renal service was consulted for edema.    1. A/C heart failure with anasarca, Pro BNP > 4000    No proteinuria, severe renal failure or cirrhosis (albumin 4).   The etiology of her anasarca is most likely associated with A/C heat failure. Likely left and right heart failure given her pulmonary interstitial edema and severe anasarca.  Poor candidate for RRT with comorbidities  - On Lasix 160 every 6  Hours and Metolazone 5 daily - Cr bumped a little-->component of cardio renal syndrome and diuretics.  -- Baseline weight ~215 lbs. Weight this admission is not accurate. Suspect her true weight is 260 lbs now.  -  minimum UOP ~1200 ml over last 24 hours--bladder scan pending -  Agree with echo - if her UOP is accurate without foley catheter kinking, will recs cardiology HF consult for inotropes to facilitate diuresis.  2. Pyuria--Ucx GNR> 100,000, likely colonized from long term foley catheter since 12/01/12. - recs replace foley catherter, defer to primary team.   3. CKD III  4. Anemia, chronic 5. Morbid obesity 6. HTN 7. DM, type2. A1C 6.8 8. Limited code   Length of Stay: 2 days  Patient history and plan of care reviewed  with attending, Dr. Briant Cedar .   Dede Query, MD  PGYII, Internal Medicine Resident 01/04/2013, 8:53 AM I have seen and examined this patient and agree with plan per Dr Dierdre Searles.  I think her biggest problem is rt heart failure and I don't see hemodialysis as a good long term solution as she has been bedridden for last six months and getting to outpt HD will be problematic.  CO2 is high, will check ABG's to see if resp or metabolic.  Not sure the role of afterload reducers with nl EF (at least in Oct) and I would DC the hydralazine as in this situation it may reduce renal perfusion MATTINGLY,MICHAEL T,MD 01/04/2013 12:23 PM

## 2013-01-05 LAB — BASIC METABOLIC PANEL
CO2: 45 mEq/L (ref 19–32)
GFR calc non Af Amer: 22 mL/min — ABNORMAL LOW (ref >90)
Glucose, Bld: 157 mg/dL — ABNORMAL HIGH (ref 70–99)
Potassium: 3.4 mEq/L — ABNORMAL LOW (ref 3.5–5.1)
Sodium: 140 mEq/L (ref 135–145)

## 2013-01-05 LAB — CBC
MCH: 26.9 pg (ref 26.0–34.0)
MCV: 88.3 fL (ref 78.0–100.0)
Platelets: 86 10*3/uL — ABNORMAL LOW (ref 150–400)
RBC: 3.34 MIL/uL — ABNORMAL LOW (ref 3.87–5.11)
RDW: 16.7 % — ABNORMAL HIGH (ref 11.5–15.5)

## 2013-01-05 LAB — GLUCOSE, CAPILLARY: Glucose-Capillary: 145 mg/dL — ABNORMAL HIGH (ref 70–99)

## 2013-01-05 LAB — PROTIME-INR
INR: 2.51 — ABNORMAL HIGH (ref 0.00–1.49)
Prothrombin Time: 25.9 seconds — ABNORMAL HIGH (ref 11.6–15.2)

## 2013-01-05 MED ORDER — LACTULOSE 10 GM/15ML PO SOLN
30.0000 g | Freq: Every day | ORAL | Status: DC | PRN
  Administered 2013-01-21: 30 g via ORAL
  Filled 2013-01-05 (×2): qty 45

## 2013-01-05 MED ORDER — IPRATROPIUM BROMIDE 0.02 % IN SOLN: 0.5000 mg | RESPIRATORY_TRACT | Status: DC | PRN

## 2013-01-05 MED ORDER — ALUM & MAG HYDROXIDE-SIMETH 200-200-20 MG/5ML PO SUSP
15.0000 mL | ORAL | Status: DC | PRN
  Administered 2013-01-05 – 2013-01-20 (×2): 15 mL via ORAL
  Filled 2013-01-05 (×2): qty 30

## 2013-01-05 MED ORDER — WARFARIN SODIUM 3 MG PO TABS
3.0000 mg | ORAL_TABLET | Freq: Once | ORAL | Status: AC
  Administered 2013-01-05: 3 mg via ORAL
  Filled 2013-01-05: qty 1

## 2013-01-05 MED ORDER — NEPRO/CARBSTEADY PO LIQD
237.0000 mL | Freq: Two times a day (BID) | ORAL | Status: DC
  Administered 2013-01-05 – 2013-01-23 (×30): 237 mL via ORAL
  Filled 2013-01-05 (×20): qty 237

## 2013-01-05 NOTE — Clinical Social Work Placement (Addendum)
Clinical Social Work Department CLINICAL SOCIAL WORK PLACEMENT NOTE 01/05/2013  Patient:  Gina Ashley, Gina Ashley  Account Number:  [id number] Admit date:  01/02/2013  Clinical Social Worker:  Genelle Bal, LCSW  Date/time:  01/05/2013 02:50 AM  Clinical Social Work is seeking post-discharge placement for this patient at the following level of care:   SKILLED NURSING   (*CSW will update this form in Epic as items are completed)   01/04/2013  Patient/family provided with [facility] Department of Clinical Social Work's list of facilities offering this level of care within the geographic area requested by the patient (or if unable, by the patient's family).  01/04/2013  Patient/family informed of their freedom to choose among providers that offer the needed level of care, that participate in Medicare, Medicaid or managed care program needed by the patient, have an available bed and are willing to accept the patient.    Patient/family informed of [facility]' ownership interest in [facility], as well as of the fact that they are under no obligation to receive care at this facility.  PASARR submitted to EDS on 08/06/2012 PASARR number received from [employer] on 08/06/2012  FL2 transmitted to all facilities in geographic area requested by pt/family on  01/05/2013 FL2 transmitted to all facilities within larger geographic area on 01/05/2013  Patient informed that his/her managed care company has contracts with or will negotiate with  certain facilities, including the following:     Patient/family informed of bed offers received: 01/05/13 , 01/07/13 Patient chooses bed at  Physician recommends and patient chooses bed at    Patient to be transferred to  on   Patient to be transferred to facility by   The following physician request were entered in Epic:   Additional Comments: 01/05/13 - CSW gave bed offers to patient in her room and to the [employer] by phone.

## 2013-01-05 NOTE — Progress Notes (Signed)
CRITICAL VALUE ALERT  Critical value received:  co2 45  Date of notification:  01/05/13  Time of notification:  0715  Critical value read back:yes  Nurse who received alert:  Ilean Skill LPN  MD notified (1st page):  yes  Time of first page:  0720  MD notified (2nd page):  Time of second page:  Responding MD:  DR. Elisabeth Pigeon  Time MD responded:  [phone]

## 2013-01-05 NOTE — Progress Notes (Signed)
ANTICOAGULATION CONSULT NOTE - Follow Up Consult  Pharmacy Consult for Warfarin  Indication: atrial fibrillation  Allergies  Allergen Reactions  . Morphine Sulfate Other (See Comments)    REACTION: change in personality  . Remeron (Mirtazapine) Other (See Comments)    Altered mental status, lethargy  . Tuna (Fish Allergy) Other (See Comments)    unknown  . Penicillins Rash    Tolerates Ancef.    Patient Measurements: Height: 5\' 3"  (160 cm) Weight: 261 lb 14.5 oz (118.8 kg) IBW/kg (Calculated) : 52.4  Vital Signs: Temp: 97.8 F (36.6 C) (03/04 0912) Temp src: Oral (03/04 0912) BP: 144/100 mmHg (03/04 0912) Pulse Rate: 104 (03/04 0912)  Labs:  Recent Labs  01/02/13 2354 01/03/13 0800 01/03/13 1135 01/04/13 0626 01/05/13 0530  HGB  --  10.3*  --  9.5* 9.0*  HCT  --  34.0*  --  31.7* 29.5*  PLT  --  99*  --  84* 86*  LABPROT 19.9* 20.4*  --  23.2* 25.9*  INR 1.76* 1.82*  --  2.16* 2.51*  CREATININE  --  1.62*  --  1.86* 2.01*  TROPONINI <0.30 <0.30 <0.30  --   --     Estimated Creatinine Clearance: 26.4 ml/min (by C-G formula based on Cr of 2.01).   Medications:  Scheduled:  . allopurinol  100 mg Oral Daily  . amiodarone  200 mg Oral Daily  . busPIRone  5 mg Oral Q12H  . carvedilol  25 mg Oral BID WC  . docusate sodium  100 mg Oral Q8H  . feeding supplement (NEPRO CARB STEADY)  237 mL Oral BID BM  . furosemide  160 mg Intravenous Q6H  . guaiFENesin  600 mg Oral BID  . insulin aspart  0-5 Units Subcutaneous QHS  . insulin aspart  0-9 Units Subcutaneous TID WC  . insulin glargine  5 Units Subcutaneous QHS  . ipratropium  0.5 mg Nebulization TID  . isosorbide dinitrate  20 mg Oral Q8H  . metolazone  10 mg Oral Daily  . OxyCODONE  10 mg Oral Q12H  . pantoprazole  40 mg Oral Daily  . polyethylene glycol  17 g Oral BID  . sodium chloride  3 mL Intravenous Q12H  . spironolactone  25 mg Oral Daily  . traZODone  50 mg Oral QHS  . [COMPLETED] warfarin  4 mg  Oral ONCE-1800  . Warfarin - Pharmacist Dosing Inpatient   Does not apply q1800    Assessment: 79 y/o F who continues of warfarin for Afib. INR on admit was sub-therapeutic at 1.76. INR today is 2.51<2.16<1.82. H/H is low and stable, no bleeding reported. Scr 2.01, CrCl ~83mL/min. Noted DDI with amiodarone.   Goal of Therapy:  INR 2-3 Monitor platelets by anticoagulation protocol: Yes   Plan:   -Warfarin 3mg  PO x 1 tonight at 1800 -Daily PT/INR -Monitor for bleeding  Thank you for the consult,   Abran Duke, PharmD Clinical Pharmacist Phone: [phone] Pager: [phone] 01/05/2013 1:37 PM

## 2013-01-05 NOTE — Progress Notes (Addendum)
[facility] PROGRESS NOTE  Gina Ashley ZOX:[id number] DOB: Jan 08, 1927 DOA: 01/02/2013 PCP: Ernestine Conrad, MD  Brief narrative: 79 y.o. female with past medical history of CKD stage 3, requiring short term HD in past, atrial fibrillation (on coumadin), chronic back and shoulder pain, GERD, COPD on home oxygen, sleep apnea on CPAP, chronic diastolic CHF, DM and multiple lower extremity fractures who presented to ED 01/02/2013 due to worsening generalized swelling and shortness of breath over past week prior to this admission. Nephrology is assisting the management. For now, plan is to continue lasix at the current dosage 160 mg Q 6 hours IV and monitor urine output. Due to significant comoridities no plan for HD at this time.  Assessment/Plan:   Principal Problem:  *Shortness of breath  Likely diastolic CHF, fluid overload, renal failure and COPD all contributory Increased lasix to 160 mg IV Q 6 hours (01/04/2013 from 120 mg Q 6 hours IV) per renal recomendation, monitor intake and output. In past 24 hours urine output 1.8  L.  Continue metolazone 10 mg daily Continue Atrovent nebulizer scheduled Q 6 hours; albuterol PRN Q 4 hours Active Problems:  DM with complications (CKD), controlled Hemoglobin A1c on this admission 6.8 Continue sliding scale insulin  Insulin Glargine 5 units Q HS CBG's in past 24 hours: 209, 145, 166 OBSTRUCTIVE SLEEP APNEA  On CPAP HYPERTENSION  Per renal recommendations we will hydralazine 25 mg PO TID  Continue isordil 20 mg O Q 8 Hours, coreg 25 mg PO BID, spironolactone 25 mg daily GERD  Continue protonix Atrial fibrillation  Coumadin per pharmacy  Continue amiodarone and coreg  INR therapeutic at 2.52 COPD (chronic obstructive pulmonary disease)  Nebulizers: Atrovent scheduled and albuterol PRN Anemia due to chronic illness  Secondary to CKD  Hemoglobin stable in range 9 - 9.5 CKD (chronic kidney disease)  Creatinine 1.92 in 11/2012  Trended up from  1.62 to 1.86 to 2.01 today. This is likely reflective of high lasix dosage. Appreciate renal following Chronic diastolic heart failure  BNP 4076 on this admission  Continue lasix 160 mg Q 6 hours IVl in past 24 hours 1.8L negative fluid balance 2 D ECHO in 08/2012 with EF 60%; 2 D ECHO on this admission - EF 50 -55% Continue spironolactone and coreg Obesity (BMI 30-39.9)  Nutrition consulted Depression  Continue buspirone  Code Status: partial code blue  Family Communication: no family at bedside  Disposition Plan: home or SNF when stable   [name], MD  [facility]  Pager [phone]   Consultants:  Nephrology  Procedures:  None  Antibiotics:  None    If 7PM-7AM, please contact night-coverage [email or web address] Password [facility] 01/05/2013, 11:37 AM   LOS: 3 days    HPI/Subjective: No acute overnight events.  Objective: Filed Vitals:   01/04/13 2148 01/05/13 0451 01/05/13 0912 01/05/13 0936  BP:  110/74 144/100   Pulse:  108 104   Temp:  98 F (36.7 C) 97.8 F (36.6 C)   TempSrc:  Oral Oral   Resp:  18 17   Height:      Weight:    118.8 kg (261 lb 14.5 oz)  SpO2: 95% 100% 99% 97%    Intake/Output Summary (Last 24 hours) at 01/05/13 1137 Last data filed at 01/05/13 0800  Gross per 24 hour  Intake    840 ml  Output   2700 ml  Net  -1860 ml    Exam:   General:  Pt is alert, follows commands appropriately,  not in acute distress  Cardiovascular: Regular rate and rhythm, S1/S2 appreciated  Respiratory: Clear to auscultation bilaterally, no wheezing  Abdomen: Soft, non tender, obese, bowel sounds present, no guarding  Extremities: (+3) LE pitting edema, pulses DP and PT palpable bilaterally  Neuro: Grossly nonfocal  Data Reviewed: Basic Metabolic Panel:  Recent Labs Lab 01/02/13 1511 01/03/13 0800 01/04/13 0626 01/05/13 0530  NA 141 140 139 140  K 3.9 3.8 3.5 3.4*  CL 93* 91* 93* 91*  CO2 41* 40* 40* 45*  GLUCOSE 122* 154* 155* 157*  BUN 43* 43* 47*  48*  CREATININE 1.65* 1.62* 1.86* 2.01*  CALCIUM 10.1 10.0 9.7 9.5  MG  --  2.0  --   --   PHOS  --  3.4  --   --    Liver Function Tests:  Recent Labs Lab 01/03/13 0800  AST 20  ALT 12  ALKPHOS 163*  BILITOT 0.8  PROT 7.1  ALBUMIN 4.2   CBC:  Recent Labs Lab 01/02/13 1511 01/03/13 0800 01/04/13 0626 01/05/13 0530  WBC 5.6 6.1 6.0 5.6  NEUTROABS 4.3  --   --   --   HGB 10.4* 10.3* 9.5* 9.0*  HCT 33.9* 34.0* 31.7* 29.5*  MCV 88.3 89.0 89.5 88.3  PLT 95* 99* 84* 86*   Cardiac Enzymes:  Recent Labs Lab 01/02/13 2354 01/03/13 0800 01/03/13 1135  TROPONINI <0.30 <0.30 <0.30   BNP: No components found with this basename: POCBNP,  CBG:  Recent Labs Lab 01/04/13 0743 01/04/13 1121 01/04/13 1637 01/04/13 2124 01/05/13 0755  GLUCAP 128* 138* 123* 209* 145*    Recent Results (from the past 240 hour(s))  MRSA PCR SCREENING     Status: None   Collection Time    01/03/13  1:03 AM      Result Value Range Status   MRSA by PCR NEGATIVE  NEGATIVE Final   Comment:            The GeneXpert MRSA Assay (FDA     approved for NASAL specimens     only), is one component of a     comprehensive MRSA colonization     surveillance program. It is not     intended to diagnose MRSA     infection nor to guide or     monitor treatment for     MRSA infections.  URINE CULTURE     Status: None   Collection Time    01/03/13  3:06 AM      Result Value Range Status   Specimen Description URINE, CATHETERIZED   Final   Special Requests ADDED 0337   Final   Culture  Setup Time 01/03/2013 03:54   Final   Colony Count >=100,000 COLONIES/ML   Final   Culture GRAM NEGATIVE RODS   Final   Report Status PENDING   Incomplete     Studies: No results found.  Scheduled Meds: . allopurinol  100 mg Oral Daily  . amiodarone  200 mg Oral Daily  . busPIRone  5 mg Oral Q12H  . carvedilol  25 mg Oral BID WC  . docusate sodium  100 mg Oral Q8H  . (NEPRO CARB   237 mL Oral BID BM  .  furosemide  160 mg Intravenous Q6H  . guaiFENesin  600 mg Oral BID  . insulin aspart  0-5 Units Subcutaneous QHS  . insulin aspart  0-9 Units Subcutaneous TID WC  . insulin glargine  5 Units Subcutaneous QHS  .  ipratropium  0.5 mg Nebulization TID  . isosorbide dinitrate  20 mg Oral Q8H  . metolazone  10 mg Oral Daily  . OxyCODONE  10 mg Oral Q12H  . pantoprazole  40 mg Oral Daily  . polyethylene glycol  17 g Oral BID  . sodium chloride  3 mL Intravenous Q12H  . spironolactone  25 mg Oral Daily  . traZODone  50 mg Oral QHS  . Warfarin    Does not apply q1800

## 2013-01-05 NOTE — Progress Notes (Signed)
[facility] - PROGRESS NOTE Resident Note   Please see below for attending addendum to resident note.  Subjective:   The patient is a 79 y.o. year-old with hx of morbid obesity, HTN (all her life), CKD III, afib, OSA on CPAP and DM2 x 10-15 years presenting with marked anasarca to ED. She has been either in hospitals or SNF"s or LTAC's for the last 6 months. She fell and broke her R femur and L lower leg requiring surgical repair during this time. She has gotten temporary dialysis during this time, details not available. Creat is 1.6. She says swelling of abdomen and legs has been there for 7 days. Denies SOB, n/v/d, CP or cough, orthopnea. No fam hx of kidney disease. No NSAID"S. Non ambulatory   UA shows no protein, high pH 7.5  Serum HCO3 is 40  CXR bilat effusions +/- edema  ABG's from 2013 with pCO2 60-70 range  Creatinine in last 8 mos runs between 1.1- 2.7   Patient was started aggressive diuretic therapy Lasix 120 mg then increased to160 mg IV every 6 hour. Metolazone increased to 10 yesterday to facilitate diuresis. UOP > 3000 yesterday  ( patient has foley catheter inserted since 12/01/12 from outside facility)   Objective:    Vital Signs:   Temp:  [97.8 F (36.6 C)-98.6 F (37 C)] 97.8 F (36.6 C) (03/04 0912) Pulse Rate:  [90-123] 104 (03/04 0912) Resp:  [17-20] 17 (03/04 0912) BP: (110-151)/(74-100) 144/100 mmHg (03/04 0912) SpO2:  [94 %-100 %] 97 % (03/04 0936) Weight:  [261 lb 14.5 oz (118.8 kg)] 261 lb 14.5 oz (118.8 kg) (03/04 0936) Last BM Date: 01/02/13  24-hour weight change: Weight change: 10 lb 15.5 oz (4.974 kg)  Weight trends: Filed Weights   01/03/13 2044 01/04/13 0900 01/05/13 0936  Weight: 250 lb 8 oz (113.626 kg) 261 lb 7.5 oz (118.6 kg) 261 lb 14.5 oz (118.8 kg)    Intake/Output:  03/03 0701 - 03/04 0700 In: 840 [P.O.:840] Out: 3400 [Urine:3400]  Physical Exam:  Gen: alert, elderly pleasant female  Skin: no rash, cyanosis   HEENT: EOMI, sclera anicteric, throat clear  Neck: ++ JVD ( she had PICC line and temporary HD catheter in her neck), no LAN  Chest: bilateral basilar rales  CV: regular, no rub or gallop, no carotid bruits  Abdomen: soft, obese, marked abd wall edema in lateral areas  Ext: 2-3+ pretibial and thigh edema bilat, no joint effusion or deformity, no gangrene or ulceration  Neuro: alert, Ox3, no focal deficit, no asterixis     Labs: Basic Metabolic Panel:  Recent Labs Lab 01/02/13 1511 01/03/13 0800 01/04/13 0626 01/05/13 0530  NA 141 140 139 140  K 3.9 3.8 3.5 3.4*  CL 93* 91* 93* 91*  CO2 41* 40* 40* 45*  GLUCOSE 122* 154* 155* 157*  BUN 43* 43* 47* 48*  CREATININE 1.65* 1.62* 1.86* 2.01*  CALCIUM 10.1 10.0 9.7 9.5  MG  --  2.0  --   --   PHOS  --  3.4  --   --     Liver Function Tests:  Recent Labs Lab 01/03/13 0800  AST 20  ALT 12  ALKPHOS 163*  BILITOT 0.8  PROT 7.1  ALBUMIN 4.2   No results found for this basename: LIPASE, AMYLASE,  in the last 168 hours No results found for this basename: AMMONIA,  in the last 168 hours  CBC:  Recent Labs Lab 01/02/13 1511 01/03/13 0800  01/04/13 0626 01/05/13 0530  WBC 5.6 6.1 6.0 5.6  NEUTROABS 4.3  --   --   --   HGB 10.4* 10.3* 9.5* 9.0*  HCT 33.9* 34.0* 31.7* 29.5*  MCV 88.3 89.0 89.5 88.3  PLT 95* 99* 84* 86*    Cardiac Enzymes:  Recent Labs Lab 01/02/13 2354 01/03/13 0800 01/03/13 1135  TROPONINI <0.30 <0.30 <0.30    BNP: No components found with this basename: POCBNP,   CBG:  Recent Labs Lab 01/04/13 0743 01/04/13 1121 01/04/13 1637 01/04/13 2124 01/05/13 0755  GLUCAP 128* 138* 123* 209* 145*    Microbiology: Results for orders placed during the hospital encounter of 01/02/13  MRSA PCR SCREENING     Status: None   Collection Time    01/03/13  1:03 AM      Result Value Range Status   MRSA by PCR NEGATIVE  NEGATIVE Final   Comment:            The GeneXpert MRSA Assay (FDA      approved for NASAL specimens     only), is one component of a     comprehensive MRSA colonization     surveillance program. It is not     intended to diagnose MRSA     infection nor to guide or     monitor treatment for     MRSA infections.  URINE CULTURE     Status: None   Collection Time    01/03/13  3:06 AM      Result Value Range Status   Specimen Description URINE, CATHETERIZED   Final   Special Requests ADDED 0337   Final   Culture  Setup Time 01/03/2013 03:54   Final   Colony Count >=100,000 COLONIES/ML   Final   Culture GRAM NEGATIVE RODS   Final   Report Status PENDING   Incomplete    Coagulation Studies:  Recent Labs  01/02/13 2354 01/03/13 0800 01/04/13 0626 01/05/13 0530  LABPROT 19.9* 20.4* 23.2* 25.9*  INR 1.76* 1.82* 2.16* 2.51*    Urinalysis:  Recent Labs  01/03/13 0306  COLORURINE YELLOW  LABSPEC 1.010  PHURINE 6.0  GLUCOSEU NEGATIVE  HGBUR MODERATE*  BILIRUBINUR NEGATIVE  KETONESUR NEGATIVE  PROTEINUR NEGATIVE  UROBILINOGEN 0.2  NITRITE NEGATIVE  LEUKOCYTESUR MODERATE*      Imaging: No results found.    Medications:    Infusions:    Scheduled Medications: . allopurinol  100 mg Oral Daily  . amiodarone  200 mg Oral Daily  . busPIRone  5 mg Oral Q12H  . carvedilol  25 mg Oral BID WC  . docusate sodium  100 mg Oral Q8H  . furosemide  160 mg Intravenous Q6H  . guaiFENesin  600 mg Oral BID  . insulin aspart  0-5 Units Subcutaneous QHS  . insulin aspart  0-9 Units Subcutaneous TID WC  . insulin glargine  5 Units Subcutaneous QHS  . ipratropium  0.5 mg Nebulization TID  . isosorbide dinitrate  20 mg Oral Q8H  . metolazone  10 mg Oral Daily  . OxyCODONE  10 mg Oral Q12H  . pantoprazole  40 mg Oral Daily  . polyethylene glycol  17 g Oral BID  . sodium chloride  3 mL Intravenous Q12H  . spironolactone  25 mg Oral Daily  . traZODone  50 mg Oral QHS  . Warfarin - Pharmacist Dosing Inpatient   Does not apply q1800    PRN  Medications: sodium chloride, acetaminophen, albuterol, diphenhydrAMINE,  HYDROcodone-acetaminophen, ondansetron (ZOFRAN) IV, oxyCODONE, simethicone, sodium chloride   Assessment/ Plan:   The patient is a 79 y.o. year-old with hx of morbid obesity, HTN (all her life), CKD III, afib, OSA on CPAP and DM2 x 10-15 years presenting with marked anasarca to ED. She has been either in hospitals or SNF"s or LTAC's for the last 6 months. She fell and broke her R femur and L lower leg requiring surgical repair during this time. She has gotten temporary dialysis during this time, details not available. Creat is 1.6. She says swelling of abdomen and legs has been there for 7 days. Denies SOB, n/v/d, CP or cough, orthopnea. No fam hx of kidney disease. No NSAID"S. Non ambulatory. Renal service was consulted for edema.    1. A/C heart failure with anasarca, Pro BNP > 4000  No proteinuria, severe renal failure or cirrhosis (albumin 4).  The etiology of her anasarca is most likely associated with A/C heat failure. Likely left and right heart failure given her pulmonary interstitial edema and severe anasarca. Poor candidate for RRT with comorbidities   - On Lasix 160 every 6 Hours and Metolazone 10 daily--better UOP. No weight changes.  - Cr bumped a little-->component of cardio renal syndrome and diuretics.  -- Baseline weight ~215 lbs. Weight this admission is not accurate. Suspect her true weight is 260 lbs now.  - better UOP ~3000 over last 24 hours - continue current tx  - Echo--LVEF 50-55%, severely dilated LA and moderate dilate RA  2. Pyuria--Ucx GNR> 100,000, likely colonized from long term foley catheter since 12/01/12.  - recs replace foley catherter, defer to primary team.   3. CKD III  4. Anemia, chronic  5. Morbid obesity  6. HTN  7. DM, type2. A1C 6.8  8. Limited code   Length of Stay: 2 days  Patient history and plan of care reviewed with attending, Dr. Briant Cedar .     Dede Query, MD   PGYII, Internal Medicine Resident 01/05/2013, 10:22 AM I have seen and examined this patient and agree with plan per Dr Ian Bushman.  UO very good yest.  CO constipation, will give dose of lactulose.  Will also give nepro BID. MATTINGLY,MICHAEL T,MD 01/05/2013 10:35 AM

## 2013-01-05 NOTE — Clinical Social Work Psychosocial (Signed)
Clinical Social Work Department BRIEF PSYCHOSOCIAL ASSESSMENT 01/05/2013  Patient:  Gina [facility]     Account Number:  [id number]     Admit date:  01/02/2013  Clinical Social Worker:  Delmer Islam  Date/Time:  01/05/2013 02:19 AM  Referred by:  Physician  Date Referred:  01/04/2013 Referred for  SNF Placement   Other Referral:   Interview type:  Patient Other interview type:   On 01/04/13 CSW also talked with daughter Gina [facility] [id number]) who was at the bedside    PSYCHOSOCIAL DATA Living Status:  FACILITY Admitted from facility:   Level of care:  Skilled Nursing Facility Primary support name:  Gina [facility] Primary support relationship to patient:  CHILD, ADULT Degree of support available:   Patient from [facility] skilled [facility]. Patient's daughter is very supportive and handles things, per patient.    CURRENT CONCERNS Current Concerns  Post-Acute Placement   Other Concerns:    SOCIAL WORK ASSESSMENT / PLAN On 01/04/13 CSW talked with patient and daughter regarding short-term rehab. She came to the [facility] from [facility] skilled [facility], but does not want to return. Patient talked about the challenges she faced in terms of the care she received, mainly from the aides at [facility]. CSW listened empathically and allowed patient to verbalize her feelings. CSW supported patient in being assertive in making her concerns known to administration at [facility].    The patient and daughter are aware of the need for rehab and are in agreement. CSW explained the SNF search process and were given a list of skilled facilities in [facility]. The patient and daughter live together in [place] and are fine with her information being sent to the SNF's in that county, however their first choice is [place].   Assessment/plan status:  Psychosocial Support/Ongoing Assessment of Needs Other assessment/ plan:   Information/referral to community resources:   Daughter given  skilled facility list for [facility] onn 01/04/13.    PATIENT'S/FAMILY'S RESPONSE TO PLAN OF CARE: Patient and daughter very pleasant and open to talking with CSW. Ms. Mruk thanked CSW for listening and understanding her perspective regarding [facility] and the care she received.

## 2013-01-05 NOTE — Progress Notes (Signed)
Patient placed on Bipap 10/5 with 2L of oxygen bled in.  Patient tolerating well at this time.  RT will continue to monitor.

## 2013-01-06 ENCOUNTER — Inpatient Hospital Stay (HOSPITAL_COMMUNITY): Payer: Medicare Other

## 2013-01-06 DIAGNOSIS — I5033 Acute on chronic diastolic (congestive) heart failure: Secondary | ICD-10-CM

## 2013-01-06 DIAGNOSIS — E119 Type 2 diabetes mellitus without complications: Secondary | ICD-10-CM

## 2013-01-06 DIAGNOSIS — I5021 Acute systolic (congestive) heart failure: Secondary | ICD-10-CM | POA: Diagnosis present

## 2013-01-06 DIAGNOSIS — I509 Heart failure, unspecified: Secondary | ICD-10-CM

## 2013-01-06 LAB — BASIC METABOLIC PANEL
BUN: 47 mg/dL — ABNORMAL HIGH (ref 6–23)
CO2: 44 mEq/L (ref 19–32)
Calcium: 10 mg/dL (ref 8.4–10.5)
Creatinine, Ser: 1.87 mg/dL — ABNORMAL HIGH (ref 0.50–1.10)
GFR calc non Af Amer: 24 mL/min — ABNORMAL LOW (ref >90)
Glucose, Bld: 152 mg/dL — ABNORMAL HIGH (ref 70–99)

## 2013-01-06 LAB — CBC
HCT: 30.8 % — ABNORMAL LOW (ref 36.0–46.0)
Hemoglobin: 9.7 g/dL — ABNORMAL LOW (ref 12.0–15.0)
MCH: 27.3 pg (ref 26.0–34.0)
MCHC: 31.5 g/dL (ref 30.0–36.0)
MCV: 86.8 fL (ref 78.0–100.0)
RBC: 3.55 MIL/uL — ABNORMAL LOW (ref 3.87–5.11)

## 2013-01-06 LAB — GLUCOSE, CAPILLARY
Glucose-Capillary: 135 mg/dL — ABNORMAL HIGH (ref 70–99)
Glucose-Capillary: 198 mg/dL — ABNORMAL HIGH (ref 70–99)

## 2013-01-06 LAB — PROTIME-INR: INR: 2.91 — ABNORMAL HIGH (ref 0.00–1.49)

## 2013-01-06 MED ORDER — ISOSORB DINITRATE-HYDRALAZINE 20-37.5 MG PO TABS
1.0000 | ORAL_TABLET | Freq: Two times a day (BID) | ORAL | Status: DC
  Administered 2013-01-06 – 2013-01-23 (×33): 1 via ORAL
  Filled 2013-01-06 (×37): qty 1

## 2013-01-06 MED ORDER — SODIUM CHLORIDE 0.9 % IJ SOLN
10.0000 mL | Freq: Two times a day (BID) | INTRAMUSCULAR | Status: DC
  Administered 2013-01-07: 10 mL

## 2013-01-06 MED ORDER — AMIODARONE HCL 200 MG PO TABS
200.0000 mg | ORAL_TABLET | Freq: Two times a day (BID) | ORAL | Status: DC
  Administered 2013-01-06 – 2013-01-17 (×23): 200 mg via ORAL
  Filled 2013-01-06 (×25): qty 1

## 2013-01-06 MED ORDER — WARFARIN SODIUM 1 MG PO TABS
1.5000 mg | ORAL_TABLET | Freq: Once | ORAL | Status: AC
  Administered 2013-01-06: 1.5 mg via ORAL
  Filled 2013-01-06: qty 1

## 2013-01-06 MED ORDER — SODIUM CHLORIDE 0.9 % IJ SOLN: 10.0000 mL | INTRAMUSCULAR | Status: DC | PRN

## 2013-01-06 MED ORDER — MILRINONE IN DEXTROSE 20 MG/100ML IV SOLN
0.2500 ug/kg/min | INTRAVENOUS | Status: DC
  Administered 2013-01-07 – 2013-01-11 (×7): 0.25 ug/kg/min via INTRAVENOUS
  Filled 2013-01-06 (×10): qty 100

## 2013-01-06 MED ORDER — POTASSIUM CHLORIDE CRYS ER 20 MEQ PO TBCR
40.0000 meq | EXTENDED_RELEASE_TABLET | Freq: Two times a day (BID) | ORAL | Status: AC
  Administered 2013-01-06: 40 meq via ORAL
  Filled 2013-01-06: qty 2

## 2013-01-06 MED ORDER — POTASSIUM CHLORIDE CRYS ER 20 MEQ PO TBCR
40.0000 meq | EXTENDED_RELEASE_TABLET | Freq: Once | ORAL | Status: AC
  Administered 2013-01-06: 40 meq via ORAL
  Filled 2013-01-06: qty 2

## 2013-01-06 NOTE — Progress Notes (Signed)
CRITICAL VALUE ALERT  Critical value received:  CO2 44  Date of notification:  01/06/2013  Time of notification:  0824  Critical value read back:yes  Nurse who received alert:  Lovie Macadamia  MD notified (1st page):  Dr David Stall  Time of first page:  0826  MD notified (2nd page):  Time of second page:  Responding MD:  Dr David Stall  Time MD responded:  0830

## 2013-01-06 NOTE — Progress Notes (Signed)
[facility] - PROGRESS NOTE Resident Note   Please see below for attending addendum to resident note.  Subjective:   The patient is a 79 y.o. year-old with hx of morbid obesity, HTN (all her life), CKD III, afib, OSA on CPAP and DM2 x 10-15 years presenting with marked anasarca to ED. She has been either in hospitals or SNF"s or LTAC's for the last 6 months. She fell and broke her R femur and L lower leg requiring surgical repair during this time. She has gotten temporary dialysis during this time, details not available. Creat is 1.6. She says swelling of abdomen and legs has been there for 7 days. Denies SOB, n/v/d, CP or cough, orthopnea. No fam hx of kidney disease. No NSAID"S. Non ambulatory  UA shows no protein, high pH 7.5  Serum HCO3 is 40  CXR bilat effusions +/- edema  ABG's from 2013 with pCO2 60-70 range  Creatinine in last 8 mos runs between 1.1- 2.7   Patient was started aggressive diuretic therapy Lasix 120 mg then increased to160 mg IV every 6 hour. Metolazone increased to 10 to facilitate diuresis. UOP > 3000 3/4, and 950 today  ( patient has foley catheter inserted since 12/01/12 from outside facility)   Objective:    Vital Signs:   Temp:  [97.7 F (36.5 C)-98.1 F (36.7 C)] 97.7 F (36.5 C) (03/05 0508) Pulse Rate:  [97-113] 97 (03/05 0508) Resp:  [16-22] 16 (03/05 0508) BP: (107-150)/(75-101) 150/99 mmHg (03/05 0508) SpO2:  [96 %-99 %] 99 % (03/05 0508) Weight:  [261 lb 14.5 oz (118.8 kg)] 261 lb 14.5 oz (118.8 kg) (03/04 0936) Last BM Date: 01/05/13  24-hour weight change: Weight change: 7.1 oz (0.2 kg)  Weight trends: Filed Weights   01/03/13 2044 01/04/13 0900 01/05/13 0936  Weight: 250 lb 8 oz (113.626 kg) 261 lb 7.5 oz (118.6 kg) 261 lb 14.5 oz (118.8 kg)    Intake/Output:  03/04 0701 - 03/05 0700 In: 2324 [P.O.:2142; IV Piggyback:182] Out: 950 [Urine:950] Physical Exam:  Gen: alert, elderly pleasant female  Skin: no rash,  cyanosis  HEENT: EOMI, sclera anicteric, throat clear  Neck: ++ JVD ( she had PICC line and temporary HD catheter in her neck), no LAN  Chest: bilateral basilar rales  CV: regular, no rub or gallop, no carotid bruits  Abdomen: soft, obese, marked abd wall edema in lateral areas  Ext: 2-3+ pretibial and thigh edema bilat, no joint effusion or deformity, no gangrene or ulceration  Neuro: alert, Ox3, no focal deficit, no asterixis   Labs: Basic Metabolic Panel:  Recent Labs Lab 01/02/13 1511 01/03/13 0800 01/04/13 0626 01/05/13 0530 01/06/13 0655  NA 141 140 139 140 139  K 3.9 3.8 3.5 3.4* 3.3*  CL 93* 91* 93* 91* 89*  CO2 41* 40* 40* 45* 44*  GLUCOSE 122* 154* 155* 157* 152*  BUN 43* 43* 47* 48* 47*  CREATININE 1.65* 1.62* 1.86* 2.01* 1.87*  CALCIUM 10.1 10.0 9.7 9.5 10.0  MG  --  2.0  --   --   --   PHOS  --  3.4  --   --   --     Liver Function Tests:  Recent Labs Lab 01/03/13 0800  AST 20  ALT 12  ALKPHOS 163*  BILITOT 0.8  PROT 7.1  ALBUMIN 4.2   No results found for this basename: LIPASE, AMYLASE,  in the last 168 hours No results found for this basename: AMMONIA,  in  the last 168 hours  CBC:  Recent Labs Lab 01/02/13 1511 01/03/13 0800 01/04/13 0626 01/05/13 0530 01/06/13 0655  WBC 5.6 6.1 6.0 5.6 5.1  NEUTROABS 4.3  --   --   --   --   HGB 10.4* 10.3* 9.5* 9.0* 9.7*  HCT 33.9* 34.0* 31.7* 29.5* 30.8*  MCV 88.3 89.0 89.5 88.3 86.8  PLT 95* 99* 84* 86* 87*    Cardiac Enzymes:  Recent Labs Lab 01/02/13 2354 01/03/13 0800 01/03/13 1135  TROPONINI <0.30 <0.30 <0.30    BNP: No components found with this basename: POCBNP,   CBG:  Recent Labs Lab 01/05/13 0755 01/05/13 1120 01/05/13 1650 01/05/13 2139 01/06/13 0751  GLUCAP 145* 166* 189* 185* 135*    Microbiology: Results for orders placed during the hospital encounter of 01/02/13  MRSA PCR SCREENING     Status: None   Collection Time    01/03/13  1:03 AM      Result Value  Range Status   MRSA by PCR NEGATIVE  NEGATIVE Final   Comment:            The GeneXpert MRSA Assay (FDA     approved for NASAL specimens     only), is one component of a     comprehensive MRSA colonization     surveillance program. It is not     intended to diagnose MRSA     infection nor to guide or     monitor treatment for     MRSA infections.  URINE CULTURE     Status: None   Collection Time    01/03/13  3:06 AM      Result Value Range Status   Specimen Description URINE, CATHETERIZED   Final   Special Requests ADDED 0337   Final   Culture  Setup Time 01/03/2013 03:54   Final   Colony Count >=100,000 COLONIES/ML   Final   Culture GRAM NEGATIVE RODS   Final   Report Status PENDING   Incomplete    Coagulation Studies:  Recent Labs  01/04/13 0626 01/05/13 0530 01/06/13 0655  LABPROT 23.2* 25.9* 28.9*  INR 2.16* 2.51* 2.91*    Urinalysis: No results found for this basename: COLORURINE, APPERANCEUR, LABSPEC, PHURINE, GLUCOSEU, HGBUR, BILIRUBINUR, KETONESUR, PROTEINUR, UROBILINOGEN, NITRITE, LEUKOCYTESUR,  in the last 72 hours    Imaging: No results found.    Medications:    Infusions:    Scheduled Medications: . allopurinol  100 mg Oral Daily  . amiodarone  200 mg Oral Daily  . busPIRone  5 mg Oral Q12H  . carvedilol  25 mg Oral BID WC  . docusate sodium  100 mg Oral Q8H  . feeding supplement (NEPRO CARB STEADY)  237 mL Oral BID BM  . furosemide  160 mg Intravenous Q6H  . guaiFENesin  600 mg Oral BID  . insulin aspart  0-5 Units Subcutaneous QHS  . insulin aspart  0-9 Units Subcutaneous TID WC  . insulin glargine  5 Units Subcutaneous QHS  . isosorbide dinitrate  20 mg Oral Q8H  . metolazone  10 mg Oral Daily  . OxyCODONE  10 mg Oral Q12H  . pantoprazole  40 mg Oral Daily  . polyethylene glycol  17 g Oral BID  . sodium chloride  3 mL Intravenous Q12H  . spironolactone  25 mg Oral Daily  . traZODone  50 mg Oral QHS  . Warfarin - Pharmacist Dosing  Inpatient   Does not apply [id number]  PRN Medications: sodium chloride, acetaminophen, albuterol, alum & mag hydroxide-simeth, diphenhydrAMINE, HYDROcodone-acetaminophen, ipratropium, lactulose, ondansetron (ZOFRAN) IV, oxyCODONE, simethicone, sodium chloride   Assessment/ Plan:   The patient is a 79 y.o. year-old with hx of morbid obesity, HTN (all her life), CKD III, afib, OSA on CPAP and DM2 x 10-15 years presenting with marked anasarca to ED. She has been either in hospitals or SNF"s or LTAC's for the last 6 months. She fell and broke her R femur and L lower leg requiring surgical repair during this time. She has gotten temporary dialysis during this time, details not available. Creat is 1.6. She says swelling of abdomen and legs has been there for 7 days. Denies SOB, n/v/d, CP or cough, orthopnea. No fam hx of kidney disease. No NSAID"S. Non ambulatory. Renal service was consulted for edema.    1. A/C heart failure with anasarca, Pro BNP > 4000  No proteinuria, severe renal failure or cirrhosis (albumin 4).  The etiology of her anasarca is most likely associated with A/C heat failure. Likely left and right heart failure given her pulmonary interstitial edema and severe anasarca. Poor candidate for RRT with comorbidities    Recent Labs Lab 01/02/13 1511 01/03/13 0800 01/04/13 0626 01/05/13 0530 01/06/13 0655  CREATININE 1.65* 1.62* 1.86* 2.01* 1.87*   - On Lasix 160 every 6 Hours and Metolazone 10 daily-- suspect UOP not accurate -- Baseline weight ~215 lbs. Weight this admission is not accurate. Suspect her true weight is 260 lbs now.  - continue current tx  - Echo--LVEF 50-55%, severely dilated LA and moderate dilate RA  - if her UOP is accurate, recs Milrinone to renal vascular dilation diuresis.   2. Pyuria--Ucx GNR> 100,000, likely colonized from long term foley catheter since 12/01/12.  -replaced foley catherter, defer to primary team.   3. CKD III  4. Anemia, chronic  5.  Morbid obesity  6. HTN  7. DM, type2. A1C 6.8  8. Limited code  9. Hypokalemia  Length of Stay: 4 days   Patient history and plan of care reviewed with attending, Dr. Briant Cedar .    Dede Query, MD  PGYII, Internal Medicine Resident 01/06/2013, 9:02 AM I have seen and examined this patient and agree with plan per Dr Ian Bushman.  Renal fx stable.  ? I/O from yest.  UO looks good so far today.  Re enforced the concept of I/O with nursing staff.  She has 20cc/hr of IV fluids running now. Meilyn Heindl T,MD 01/06/2013 9:18 AM

## 2013-01-06 NOTE — Progress Notes (Addendum)
Pt arrived on unit at approximately 1858 alert and oriented.  Nurse unable to start Milrinone at this time, per pharmacy pt needs central line access.  IV team had been notified and are assessing pt for PICC placement. Information was passed on during report to oncoming nurse.

## 2013-01-06 NOTE — Consult Note (Signed)
Advanced Heart Failure Team Consult Note  Referring Physician:  Primary Physician:  Primary Cardiologist: Dr. Arida/Dr. C  Reason for Consultation: HF management/cardiorenal syndrome  HPI:    Gina Ashley is an 79 y.o. female with history of chronic atrial fibrillation on coumadin, diastolic heart failure and HTN.  She also has DM type 2, COPD, OSA on CPAP and renal failure with baseline Cr 1.5-1.7.  She has had 4 hospitalizations in the last 6 months, 1 included tibia/fibula fracture and the rest have been due to massive fluid overload in the setting of diastolic heart failure.  She has required short term dialysis in the past for renal failure.  She currently resides at [facility] and has noted progressive dyspnea and edema over the last 2 weeks.  Patient suspects she may have 20-30 pounds of fluid on board.  She was admitted by the hospitalist service and nephrology is on board.  Labs on admit: K 3.9, Cr 1.65, BUN 43, Troponin negative x2 and ProBNP 4706.  CXR showed cardiomegaly with mild interstitial edema and small left pleural effusion, unchanged. As well as Moderate right pleural effusion, increased.  Initially urine output was adequate but now it has slowed to 900 cc/24 hours despite high dose lasix 160 mg q6hrs and metolazone 10 mg daily.  The patient c/o orthopnea, abdominal distention and lower extremity edema.    Echo: LVEF 50-55%.  RA mod dilated, LA severely dilated 28 cm^2, Mild TR, PAPP 35 mmHg.  Mild MR.    Of note, she had renal u/s in 08/2011 showing normal Rt kidney with Lt kidney difficult to visualize.    Review of Systems: [y] = yes, [ ]  = no   General: Weight gain [ y]; Weight loss [ ] ; Anorexia [ ] ; Fatigue [ ] ; Fever [ ] ; Chills [ ] ; Weakness Cove.Etienne ]  Cardiac: Chest pain/pressure [ ] ; Resting SOB [ ] ; Exertional SOB Cove.Etienne ]; Orthopnea [ ] y; Pedal Edema Cove.Etienne ]; Palpitations [ ] ; Syncope [ ] ; Presyncope [ ] ; Paroxysmal nocturnal dyspnea[ ]   Pulmonary: Cough [ ] ; Wheezing[ ] ;  Hemoptysis[ ] ; Sputum [ ] ; Snoring [ ]   GI: Vomiting[ ] ; Dysphagia[ ] ; Melena[ ] ; Hematochezia [ ] ; Heartburn[ ] ; Abdominal pain [ ] ; Constipation [ ] ; Diarrhea [ ] ; BRBPR [ ]   GU: Hematuria[ ] ; Dysuria [ ] ; Nocturia[ ]   Vascular: Pain in legs with walking [ ] ; Pain in feet with lying flat [ ] ; Non-healing sores [ ] ; Stroke [ ] ; TIA [ ] ; Slurred speech [ ] ;  Neuro: Headaches[ ] ; Vertigo[ ] ; Seizures[ ] ; Paresthesias[ ] ;Blurred vision [ ] ; Diplopia [ ] ; Vision changes [ ]   Ortho/Skin: Arthritis [ ] ; Joint pain [y]; Muscle pain [ ] ; Joint swelling [ ] ; Back Pain [ ] ; Rash [ ]   Psych: Depression[ ] ; Anxiety[ ]   Heme: Bleeding problems [ ] ; Clotting disorders [ ] ; Anemia [ ]   Endocrine: Diabetes Cove.Etienne ]; Thyroid dysfunction[ ]   Home Medications Prior to Admission medications   Medication Sig Start Date End Date Taking? Authorizing Provider  busPIRone (BUSPAR) 5 MG tablet Take 5 mg by mouth every 12 (twelve) hours.   Yes Historical Provider, MD  diphenhydrAMINE (BENADRYL) 25 mg capsule Take 25 mg by mouth every 8 (eight) hours as needed for itching.   Yes Historical Provider, MD  HYDROcodone-acetaminophen (NORCO/VICODIN) 5-325 MG per tablet Take 1 tablet by mouth every 4 (four) hours as needed for pain.   Yes Historical Provider, MD  Multiple Vitamin (MULTIVITAMIN WITH MINERALS) TABS Take 1  tablet by mouth daily.   Yes Historical Provider, MD  ondansetron (ZOFRAN) 4 MG tablet Take 4 mg by mouth every 6 (six) hours as needed for nausea.   Yes Historical Provider, MD  oxyCODONE (OXY IR/ROXICODONE) 5 MG immediate release tablet Take 5 mg by mouth every 4 (four) hours as needed for pain.   Yes Historical Provider, MD  simethicone (MYLICON) 80 MG chewable tablet Chew 80 mg by mouth every 6 (six) hours as needed for flatulence.   Yes Historical Provider, MD  spironolactone (ALDACTONE) 25 MG tablet Take 25 mg by mouth daily.   Yes Historical Provider, MD  torsemide (DEMADEX) 20 MG tablet Take 20 mg by mouth  daily.   Yes Historical Provider, MD  vitamin C (ASCORBIC ACID) 500 MG tablet Take 500 mg by mouth every 12 (twelve) hours.   Yes Historical Provider, MD  warfarin (COUMADIN) 3 MG tablet Take 3 mg by mouth daily.   Yes Historical Provider, MD  ZINC OXIDE EX Apply 1 application topically daily.   Yes Historical Provider, MD  allopurinol (ZYLOPRIM) 100 MG tablet Take 100 mg by mouth daily.    Historical Provider, MD  amiodarone (PACERONE) 200 MG tablet Take 200 mg by mouth daily.     Historical Provider, MD  carvedilol (COREG) 25 MG tablet Take 25 mg by mouth 2 (two) times daily with a meal.    Historical Provider, MD  docusate sodium (COLACE) 100 MG capsule Take 100 mg by mouth every 8 (eight) hours.     Historical Provider, MD  guaiFENesin (MUCINEX) 600 MG 12 hr tablet Take 600 mg by mouth 2 (two) times daily.     Historical Provider, MD  hydrALAZINE (APRESOLINE) 25 MG tablet Take 25 mg by mouth 3 (three) times daily.    Historical Provider, MD  insulin aspart (NOVOLOG) 100 UNIT/ML injection Inject 1-5 Units into the skin 3 (three) times daily with meals. 150-200=1unit,201-250=2units,251-300=3units,301-350=4units,351-400=5units if over 400 call MD 09/15/12   Gwenyth Bender, NP  insulin glargine (LANTUS) 100 UNIT/ML injection Inject 5 Units into the skin at bedtime.    Historical Provider, MD  isosorbide dinitrate (ISORDIL) 20 MG tablet Take 20 mg by mouth every 8 (eight) hours.     Historical Provider, MD  nitroGLYCERIN (NITROSTAT) 0.4 MG SL tablet Place 0.4 mg under the tongue every 5 (five) minutes as needed. Chest pain    Historical Provider, MD  oxyCODONE (OXYCONTIN) 10 MG 12 hr tablet Take 10 mg by mouth every 12 (twelve) hours.    Historical Provider, MD  pantoprazole (PROTONIX) 40 MG tablet Take 40 mg by mouth daily.    Historical Provider, MD  polyethylene glycol (MIRALAX / GLYCOLAX) packet Take 17 g by mouth 2 (two) times daily.     Historical Provider, MD  traZODone (DESYREL) 50 MG tablet  Take 50 mg by mouth at bedtime.    Historical Provider, MD  Vitamin D, Ergocalciferol, (DRISDOL) 50000 UNITS CAPS Take 50,000 Units by mouth every 7 (seven) days.    Historical Provider, MD  vitamin k 100 MCG tablet Take 100 mcg by mouth daily.    Historical Provider, MD    Past Medical History: Past Medical History  Diagnosis Date  . GERD (gastroesophageal reflux disease)   . Back pain, chronic   . Pain in shoulder   . Pain in ankle   . Hiatal hernia   . HTN (hypertension)     all her life  . CKD (chronic kidney disease), stage III  GFR: 38  . Atrial fibrillation     since 1996  . Sleep apnea   . Fall 3 weeks ago    was [facility] at [facility]  . OSA on CPAP   . Depression   . PONV (postoperative nausea and vomiting)     difficult to wake up  . Chronic diastolic heart failure 08/31/2012  . Tibia fracture     BILATERAL  . Renal failure     acute on chronic   . Fibula fracture     bilateral  . Respiratory failure     acute on chronic hx of  requiring BIPAP  . COPD (chronic obstructive pulmonary disease)   . Metabolic encephalopathy     hx of   . Renal failure     acute on chronic with need for intermittent dialysis - hx of   . UTI (urinary tract infection)     hx of   . Pleural effusion     right sided now resolved   . Diabetes mellitus     x 15 yrs, hx of hypoglycemia   . CHF (congestive heart failure)     diastolic HF    Past Surgical History: Past Surgical History  Procedure Laterality Date  . Bunionectomy      bilateral  . Cholecystectomy    . Rotator cuff repair      2002  . Total knee arthroplasty      bilateral 2001  . Hammer toe surgery      2004  . Colonoscopy  07/11/2011    Procedure: COLONOSCOPY;  Surgeon: Malissa Hippo, MD;  Location: [facility];  Service: Endoscopy;  Laterality: N/A;  3:00  . Orif periprosthetic fracture  09/08/2012    Procedure: OPEN REDUCTION INTERNAL FIXATION (ORIF) PERIPROSTHETIC FRACTURE;  Surgeon: Shelda Pal, MD;  Location: [facility];  Service: Orthopedics;  Laterality: Right;  ORIF periprosthetic right proximal femur fracture Spanning external fixator left distal tibia  . External fixation leg  09/08/2012    Procedure: EXTERNAL FIXATION LEG;  Surgeon: Shelda Pal, MD;  Location: [facility];  Service: Orthopedics;  Laterality: Left;  . Joint replacement      bilateral knees   . External fixation leg  11/30/2012    Procedure: EXTERNAL FIXATION LEG;  Surgeon: Shelda Pal, MD;  Location: [facility];  Service: Orthopedics;  Laterality: Left;  Removal of External Fixation Left Knee with Evaluation with Floroscopy    Family History: Family History  Problem Relation Age of Onset  . Colon cancer Brother     Social History: History   Social History  . Marital Status: Widowed    Spouse Name: N/A    Number of Children: N/A  . Years of Education: N/A   Social History Main Topics  . Smoking status: Never Smoker   . Smokeless tobacco: Never Used  . Alcohol Use: No  . Drug Use: No  . Sexually Active: None   Other Topics Concern  . None   Social History Narrative  . None    Allergies:  Allergies  Allergen Reactions  . Morphine Sulfate Other (See Comments)    REACTION: change in personality  . Remeron (Mirtazapine) Other (See Comments)    Altered mental status, lethargy  . Tuna (Fish Allergy) Other (See Comments)    unknown  . Penicillins Rash    Tolerates Ancef.    Objective:    Vital Signs:   Temp:  [97.7 F (36.5 C)-98.7  F (37.1 C)] 98.7 F (37.1 C) (03/05 1705) Pulse Rate:  [97-113] 111 (03/05 1705) Resp:  [16-22] 18 (03/05 1705) BP: (132-152)/(71-101) 133/91 mmHg (03/05 1705) SpO2:  [95 %-99 %] 96 % (03/05 1705) Weight:  [262 lb 12.6 oz (119.2 kg)] 262 lb 12.6 oz (119.2 kg) (03/05 0900) Last BM Date: 01/05/13  Weight change: Filed Weights   01/04/13 0900 01/05/13 0936 01/06/13 0900  Weight: 261 lb 7.5 oz (118.6 kg) 261 lb 14.5 oz (118.8 kg) 262 lb 12.6 oz (119.2  kg)    Intake/Output:   Intake/Output Summary (Last 24 hours) at 01/06/13 1717 Last data filed at 01/06/13 1030  Gross per 24 hour  Intake   2042 ml  Output   1200 ml  Net    842 ml     Physical Exam: General:  Chronically ill appearing. No resp difficulty on n/c HEENT: normal Neck: supple. JVP ear . Carotids 2+ bilat; no bruits. No lymphadenopathy or thryomegaly appreciated. Cor: Distant.  Regular rate & rhythm.  No rubs, gallops or murmurs. Lungs: decreased throughout Abdomen: obese, soft, nontender, ++ distention. No hepatosplenomegaly. No bruits or masses. Good bowel sounds. Extremities: no cyanosis, clubbing, rash, 3+ thigh edema.  Well healing scar left proximal tibia Neuro: alert & orientedx3, cranial nerves grossly intact. moves all 4 extremities w/o difficulty. Affect pleasant  Telemetry: atrial flutter 90-100s  Labs: Basic Metabolic Panel:  Recent Labs Lab 01/02/13 1511 01/03/13 0800 01/04/13 0626 01/05/13 0530 01/06/13 0655  NA 141 140 139 140 139  K 3.9 3.8 3.5 3.4* 3.3*  CL 93* 91* 93* 91* 89*  CO2 41* 40* 40* 45* 44*  GLUCOSE 122* 154* 155* 157* 152*  BUN 43* 43* 47* 48* 47*  CREATININE 1.65* 1.62* 1.86* 2.01* 1.87*  CALCIUM 10.1 10.0 9.7 9.5 10.0  MG  --  2.0  --   --   --   PHOS  --  3.4  --   --   --     Liver Function Tests:  Recent Labs Lab 01/03/13 0800  AST 20  ALT 12  ALKPHOS 163*  BILITOT 0.8  PROT 7.1  ALBUMIN 4.2   No results found for this basename: LIPASE, AMYLASE,  in the last 168 hours No results found for this basename: AMMONIA,  in the last 168 hours  CBC:  Recent Labs Lab 01/02/13 1511 01/03/13 0800 01/04/13 0626 01/05/13 0530 01/06/13 0655  WBC 5.6 6.1 6.0 5.6 5.1  NEUTROABS 4.3  --   --   --   --   HGB 10.4* 10.3* 9.5* 9.0* 9.7*  HCT 33.9* 34.0* 31.7* 29.5* 30.8*  MCV 88.3 89.0 89.5 88.3 86.8  PLT 95* 99* 84* 86* 87*    Cardiac Enzymes:  Recent Labs Lab 01/02/13 2354 01/03/13 0800 01/03/13 1135   TROPONINI <0.30 <0.30 <0.30    BNP: BNP (last 3 results)  Recent Labs  01/03/13 0800  PROBNP 4706.0*    CBG:  Recent Labs Lab 01/05/13 1650 01/05/13 2139 01/06/13 0751 01/06/13 1130 01/06/13 1612  GLUCAP 189* 185* 135* 182* 198*    Coagulation Studies:  Recent Labs  01/04/13 0626 01/05/13 0530 01/06/13 0655  LABPROT 23.2* 25.9* 28.9*  INR 2.16* 2.51* 2.91*    Other results: EKG: atrial flutter  Imaging: No results found.   Medications:     Current Medications: . allopurinol  100 mg Oral Daily  . amiodarone  200 mg Oral BID  . busPIRone  5 mg Oral Q12H  .  carvedilol  25 mg Oral BID WC  . docusate sodium  100 mg Oral Q8H  . feeding supplement (NEPRO CARB STEADY)  237 mL Oral BID BM  . furosemide  160 mg Intravenous Q6H  . guaiFENesin  600 mg Oral BID  . insulin aspart  0-5 Units Subcutaneous QHS  . insulin aspart  0-9 Units Subcutaneous TID WC  . insulin glargine  5 Units Subcutaneous QHS  . isosorbide-hydrALAZINE  1 tablet Oral BID  . metolazone  10 mg Oral Daily  . OxyCODONE  10 mg Oral Q12H  . pantoprazole  40 mg Oral Daily  . polyethylene glycol  17 g Oral BID  . potassium chloride  40 mEq Oral BID  . sodium chloride  3 mL Intravenous Q12H  . spironolactone  25 mg Oral Daily  . traZODone  50 mg Oral QHS  . warfarin  1.5 mg Oral ONCE-1800  . Warfarin - Pharmacist Dosing Inpatient   Does not apply q1800    Infusions: . milrinone       Assessment:   1. Acute on chronic right sided heart failure 2. Acute on chronic respiratory failure 3. Acute on chronic renal failure, resolving. 4. HTN 5. Obesity, suspect OHS/OSA 6. Chronic atrial flutter     - on coumadin 7. Deconditioning  Plan/Discussion:    Ms. Wrede is an 79 year old female with history of diastolic heart failure, renal failure, HTN, obesity (suspect OHS/OSA) and chronic atrial fib/flutter who presents markedly volume overloaded in the setting of diastolic heart failure.   She is not responding to high dose lasix with addition of metolazone.    1. Acute on chronic right sided HF: Marked volume overload on exam.  I reviewed her echo.  The RV is poorly visualized but to me appears moderately dilated with moderately decreased systolic function.  The IV septum is mildly left-shifted.  PA pressure estimated at 50 mmHg.  She has responded poorly to high dose Lasix and metolazone per the renal service.  - I will start milrinone 0.25 mcg/kg/min to support RV systolic function and lower PA pressure.  Hopefully this will allow more effective diuresis with increased cardiac output.  - Agree with PICC placement, can move to stepdown to measure CVP and Co-ox - Continue lasix/metolazone per nephrology.  Ultrafiltration versus Lasix gtt will be a consideration if she responds poorly.  - RHC when more diuresed.  2. Chronic atrial fibrillation/flutter: has failed DCCV in the past. - continue Coreg and coumadin - Increase amiodarone to bid to aid with rate control.  - Will need to follow HR closely on milrinone, may rise excessively, in which case milrinone dose will need to be cut back or held. 3. OHS/OSA: Suspect OHS/OSA as possible underlying etiology of right-sided heart failure. Continue CPAP at night, O2 prn during the day.  4. AKI on CKD: Creatinine has been relatively stable but she is diuresing poorly (see above).  Suspect cardiorenal with high renal venous pressure.   Gina Ashley 01/06/2013 Length of Stay: 4

## 2013-01-06 NOTE — Progress Notes (Signed)
Peripherally Inserted Central Catheter/Midline Placement  The IV Nurse has discussed with the patient and/or persons authorized to consent for the patient, the purpose of this procedure and the potential benefits and risks involved with this procedure.  The benefits include less needle sticks, lab draws from the catheter and patient may be discharged home with the catheter.  Risks include, but not limited to, infection, bleeding, blood clot (thrombus formation), and puncture of an artery; nerve damage and irregular heat beat.  Alternatives to this procedure were also discussed.  PICC/Midline Placement Documentation  PICC Triple Lumen 01/06/13 PICC Right Basilic (Active)  Indication for Insertion or Continuance of Line Limited venous access - need for IV therapy >5 days (PICC only) 01/06/2013  8:20 PM  Length mark (cm) 0 cm 01/06/2013  8:20 PM  Site Assessment Clean;Dry;Intact 01/06/2013  8:20 PM  Lumen #1 Status Flushed;Saline locked;Blood return noted 01/06/2013  8:20 PM  Lumen #2 Status Flushed;Saline locked;Blood return noted 01/06/2013  8:20 PM  Lumen #3 Status Flushed;Saline locked;Blood return noted 01/06/2013  8:20 PM  Dressing Type Transparent 01/06/2013  8:20 PM  Dressing Status Clean;Dry;Intact;Antimicrobial disc in place 01/06/2013  8:20 PM  Dressing Change Due 01/13/13 01/06/2013  8:20 PM     Mertha Finders RN  Ronie Spies [facility] 01/06/2013, 8:22 PM

## 2013-01-06 NOTE — Progress Notes (Signed)
Physical Therapy Treatment Patient Details Name: Gina Ashley MRN: [id number] DOB: November 08, 1926 Today's Date: 01/06/2013 Time: 4098-1191 PT Time Calculation (min): 25 min  PT Assessment / Plan / Recommendation Comments on Treatment Session  Patient sat in chair trying to eat lunch, but unable due to feeling tight with pressure on abdomen. Helped nursing staff assist patient back to bed and position in left sidelying with relief.    Follow Up Recommendations  SNF           Equipment Recommendations  Wheelchair cushion (measurements PT);Wheelchair (measurements PT)       Frequency Min 3X/week   Plan Discharge plan remains appropriate    Precautions / Restrictions Precautions Precautions: Fall Required Braces or Orthoses: Knee Immobilizer - Left Knee Immobilizer - Left: On when out of bed or walking Restrictions LLE Weight Bearing: Non weight bearing   Pertinent Vitals/Pain Left LE 4/10 ice applied    Mobility  Bed Mobility Bed Mobility: Scooting to HOB;Rolling Left Rolling Left: With rail;4: Min assist Sit to Supine: 3: Mod assist;With rail;HOB flat Scooting to HOB: 4: Min assist Details for Bed Mobility Assistance: returned to supine and pt able to assist with lifting legs; noted with bed in trendelenberg able to pull self up with head board; positioned left sidelying per patient request Transfers Transfers: Lateral/Scoot Transfers Lateral/Scoot Transfers: 1: +2 Total assist Lateral Transfers: Patient Percentage: 20% Details for Transfer Assistance: from chair to higher bed, scooted with assist from weight on right LE      PT Goals Acute Rehab PT Goals PT Goal Formulation: With patient Time For Goal Achievement: 01/20/13 Potential to Achieve Goals: Good Pt will Roll Supine to Left Side: with supervision;with rail PT Goal: Rolling Supine to Left Side - Progress: Progressing toward goal Pt will go Sit to Supine/Side: with min assist;with rail PT Goal: Sit to  Supine/Side - Progress: Progressing toward goal Pt will Transfer Bed to Chair/Chair to Bed: with mod assist PT Transfer Goal: Bed to Chair/Chair to Bed - Progress: Progressing toward goal   Visit Information  Last PT Received On: 01/06/13 Assistance Needed: +2    Subjective Data  Subjective: Need to get back to bed; cuts my air off Patient Stated Goal: To return home   Cognition  Cognition Overall Cognitive Status: Appears within functional limits for tasks assessed/performed Arousal/Alertness: Awake/alert Orientation Level: Appears intact for tasks assessed Behavior During Session: Anxious    Balance    End of Session PT - End of Session Equipment Utilized During Treatment: Left knee immobilizer Activity Tolerance: Patient tolerated treatment well Patient left: in bed;with call bell/phone within reach Nurse Communication: Mobility status   GP     [facility] 01/06/2013, 3:49 PM Sheran Lawless, PT [phone] 01/06/2013

## 2013-01-06 NOTE — Progress Notes (Signed)
ANTICOAGULATION CONSULT NOTE - Follow Up Consult  Pharmacy Consult for Warfarin  Indication: atrial fibrillation  Allergies  Allergen Reactions  . Morphine Sulfate Other (See Comments)    REACTION: change in personality  . Remeron (Mirtazapine) Other (See Comments)    Altered mental status, lethargy  . Tuna (Fish Allergy) Other (See Comments)    unknown  . Penicillins Rash    Tolerates Ancef.    Patient Measurements: Height: 5\' 3"  (160 cm) Weight: 262 lb 12.6 oz (119.2 kg) IBW/kg (Calculated) : 52.4  Vital Signs: Temp: 98.6 F (37 C) (03/05 0933) Temp src: Oral (03/05 0933) BP: 152/71 mmHg (03/05 0933) Pulse Rate: 103 (03/05 0933)  Labs:  Recent Labs  01/04/13 0626 01/05/13 0530 01/06/13 0655  HGB 9.5* 9.0* 9.7*  HCT 31.7* 29.5* 30.8*  PLT 84* 86* 87*  LABPROT 23.2* 25.9* 28.9*  INR 2.16* 2.51* 2.91*  CREATININE 1.86* 2.01* 1.87*    Estimated Creatinine Clearance: 28.5 ml/min (by C-G formula based on Cr of 1.87).   Medications:  Scheduled:  . allopurinol  100 mg Oral Daily  . amiodarone  200 mg Oral Daily  . busPIRone  5 mg Oral Q12H  . carvedilol  25 mg Oral BID WC  . docusate sodium  100 mg Oral Q8H  . feeding supplement (NEPRO CARB STEADY)  237 mL Oral BID BM  . furosemide  160 mg Intravenous Q6H  . guaiFENesin  600 mg Oral BID  . insulin aspart  0-5 Units Subcutaneous QHS  . insulin aspart  0-9 Units Subcutaneous TID WC  . insulin glargine  5 Units Subcutaneous QHS  . isosorbide dinitrate  20 mg Oral Q8H  . metolazone  10 mg Oral Daily  . OxyCODONE  10 mg Oral Q12H  . pantoprazole  40 mg Oral Daily  . polyethylene glycol  17 g Oral BID  . [COMPLETED] potassium chloride  40 mEq Oral Once  . sodium chloride  3 mL Intravenous Q12H  . spironolactone  25 mg Oral Daily  . traZODone  50 mg Oral QHS  . [COMPLETED] warfarin  3 mg Oral ONCE-1800  . Warfarin - Pharmacist Dosing Inpatient   Does not apply q1800  . [DISCONTINUED] ipratropium  0.5 mg  Nebulization TID    Assessment: 79 y/o F who continues of warfarin for Afib. INR on admit was sub-therapeutic at 1.76. INR today is 2.91<2.51<2.16<1.82. H/H is low and stable, no bleeding reported. Scr 1.87<2.01, CrCl ~86mL/min. Noted DDI with amiodarone. INR continues to rise likely due to combination of extra doses on 3/2 and acute illness with fluid overload/congestion.   Goal of Therapy:  INR 2-3 Monitor platelets by anticoagulation protocol: Yes   Plan:   -Warfarin 1.5mg  PO x 1 tonight at 1800 -Daily PT/INR -Monitor for bleeding  Thank you for the consult,   Abran Duke, PharmD Clinical Pharmacist Phone: [phone] Pager: [phone] 01/06/2013 1:28 PM

## 2013-01-06 NOTE — Evaluation (Signed)
Physical Therapy Evaluation Patient Details Name: Gina Ashley MRN: [id number] DOB: 1927-10-17 Today's Date: 01/06/2013 Time: 4098-1191 PT Time Calculation (min): 34 min  PT Assessment / Plan / Recommendation Clinical Impression  Patient is an 79 y/o female admitted from LTACH with anasarca and acute CHF with h/o CKD, HTN, DM, OSA, PAF, anemia and depression.  She presents wtih decreased independence with mobility due to limited AROM, balance and activity tolerance with severe edema throughout LE's and abdomen.  She will benefit from skilled PT in the acute setting to maximize independence and allow return home after SNF stay.  She prefers not to return to [facility] for rehab (and has already been through [facility] where her fractures occurred.)    PT Assessment  Patient needs continued PT services    Follow Up Recommendations  SNF    Does the patient have the potential to tolerate intense rehabilitation    N/A  Barriers to Discharge  decreased caregiver support      Equipment Recommendations  Wheelchair (measurements PT);Wheelchair cushion (measurements PT)    Recommendations for Other Services   OT  Frequency Min 3X/week    Precautions / Restrictions Precautions Precautions: Fall Required Braces or Orthoses: Knee Immobilizer - Left Knee Immobilizer - Left: On when out of bed or walking Restrictions LLE Weight Bearing: Non weight bearing   Pertinent Vitals/Pain Min c/o pain with mobility      Mobility  Bed Mobility Bed Mobility: Supine to Sit;Sitting - Scoot to [place] of Bed;Sit to Supine Supine to Sit: 1: +2 Total assist;With rails;HOB elevated Supine to Sit: Patient Percentage: 60% Sitting - Scoot to Edge of Bed: 4: Min assist Details for Bed Mobility Assistance: assisted with moving legs to edge of bed, needed assist to lift trunk upright due to large swelling in abdomen and initially felt really tight with decreased breath until scooted to edge of  bed Transfers Transfers: Lateral/Scoot Transfers Lateral/Scoot Transfers: 1: +2 Total assist Lateral Transfers: Patient Percentage: 20% Details for Transfer Assistance: scooting downhill into chair from bed 20-30% weight on right LE; verbal cues throughout for technique        PT Diagnosis: Generalized weakness;Difficulty walking  PT Problem List: Decreased strength;Decreased range of motion;Decreased activity tolerance;Decreased balance;Decreased mobility;Cardiopulmonary status limiting activity PT Treatment Interventions: DME instruction;Functional mobility training;Therapeutic activities;Therapeutic exercise;Wheelchair mobility training;Patient/family education;Balance training   PT Goals Acute Rehab PT Goals PT Goal Formulation: With patient Time For Goal Achievement: 01/20/13 Potential to Achieve Goals: Good Pt will Roll Supine to Right Side: with rail;with supervision PT Goal: Rolling Supine to Right Side - Progress: Goal set today Pt will Roll Supine to Left Side: with supervision;with rail PT Goal: Rolling Supine to Left Side - Progress: Goal set today Pt will go Supine/Side to Sit: with min assist PT Goal: Supine/Side to Sit - Progress: Goal set today Pt will Sit at Edge of Bed: with modified independence;3-5 min;with unilateral upper extremity support PT Goal: Sit at Edge Of Bed - Progress: Goal set today Pt will go Sit to Supine/Side: with min assist;with rail PT Goal: Sit to Supine/Side - Progress: Goal set today Pt will go Sit to Stand: with mod assist;with upper extremity assist PT Goal: Sit to Stand - Progress: Goal set today Pt will go Stand to Sit: with mod assist;with upper extremity assist PT Goal: Stand to Sit - Progress: Goal set today Pt will Transfer Bed to Chair/Chair to Bed: with mod assist PT Transfer Goal: Bed to Chair/Chair to Bed - Progress: Goal  set today Pt will Propel Wheelchair: with supervision;> 150 feet PT Goal: Propel Wheelchair - Progress: Goal  set today  Visit Information  Last PT Received On: 01/06/13 Assistance Needed: +2    Subjective Data  Subjective: I was going great until this happened.  Feel like I can't sit up without getting my air cut off. Patient Stated Goal: To return home   Prior Functioning  Home Living Available Help at Discharge: Skilled Nursing Facility;Available 24 hours/day (was at [facility]; [facility]) Type of Home: Skilled Nursing Facility [facility]) Prior Function Level of Independence: Needs assistance Needs Assistance: Bathing;Dressing;Toileting;Transfers Bath: Moderate Dressing: Moderate Toileting: Moderate Transfer Assistance: was doing both scooting transfers and stand turn with walker per her report until edema, then unable to stand due to large swelling in both legs. Comments: requesting new rehab facility due to feeling care for this acute event was not optimal to prevent secondary complications Communication Communication: No difficulties    Cognition  Cognition Overall Cognitive Status: Appears within functional limits for tasks assessed/performed Arousal/Alertness: Awake/alert Orientation Level: Appears intact for tasks assessed Behavior During Session: Anxious    Extremity/Trunk Assessment Right Lower Extremity Assessment RLE ROM/Strength/Tone: Deficits RLE ROM/Strength/Tone Deficits: able to lift anti gravity, but limited AROM due to edema in lower abdomen and LE Left Lower Extremity Assessment LLE ROM/Strength/Tone: Deficits LLE ROM/Strength/Tone Deficits: able to lift antigravity, but limited AROM due to edema in lower abdomen and LE   Balance Balance Balance Assessed: Yes Static Sitting Balance Static Sitting - Balance Support: Bilateral upper extremity supported;Feet supported Static Sitting - Level of Assistance: 3: Mod assist Static Sitting - Comment/# of Minutes: initially with mod assist from behind due to needing to lean back with large edema in abdomen; then once scooted to  edge and able to sit up more with belly hanging down and feet on floor tolerated with supervision x about 5 mintues  End of Session PT - End of Session Equipment Utilized During Treatment: Left knee immobilizer Activity Tolerance: Patient tolerated treatment well Patient left: in chair Nurse Communication: Mobility status  GP     [facility] 01/06/2013, 3:41 PM  Sheran Lawless, PT [phone] 01/06/2013

## 2013-01-06 NOTE — Progress Notes (Signed)
[facility] PROGRESS NOTE Interim History: The patient is a 79 y.o. year-old with hx of morbid obesity, HTN (all her life), CKD III, afib, OSA on CPAP and DM2 x 10-15 years presenting with marked anasarca to ED. She has been either in hospitals or SNF"s or LTAC's for the last 6 months. She fell and broke her R femur and L lower leg requiring surgical repair during this time. She has gotten temporary dialysis during this time, details not available. Creat is 1.6. She says swelling of abdomen and legs has been there for 7 days. Denies SOB, n/v/d, CP or cough, orthopnea. No fam hx of kidney disease. No NSAID"S. Non ambulatory    Assessment/Plan:  Acute systolic congestive heart failure/Chronic diastolic heart failure - on lasix 160 QID. On metolazone 10. Start Bidil - UOP 900cc. Patient has foley. BNP high. - weight continue to increase, despite fluid restrict ition and high dose diuretics.- 99 kg back 10.24.2014 no 119 kg. - On Physical exam has massive JVD, crackles B/L lungs and anasarca - check a Urinary Na, place a PICC line. - Consult advance HF team.  DM with complications (CKD), controlled : - Hemoglobin A1c on this admission 6.8  - Continue sliding scale insulin  - Insulin Glargine 5 units Q HS     HYPERTENSION : - Per renal recommendations we will hydralazine 25 mg PO TID  - Continue isordil 20 mg O Q 8 Hours, coreg 25 mg PO BID, spironolactone 25 mg daily  Depression: - Continue buspirone   Obesity (BMI 30-39.9) - counseling.  CKD (chronic kidney disease) - baslein cr 1.7    Anemia due to chronic illness - per renal.   PAF (paroxysmal atrial fibrillation), was in SR 06/2012  - Coumadin per pharmacy  - Continue amiodarone and coreg  - INR therapeutic at 2.52   OBSTRUCTIVE SLEEP APNEA - c-pap   HYPERTENSION: - dc'd isordil 20 mg O Q 8 Hours, cont. coreg 25 mg PO BID, spironolactone 25 mg daily - start bidil, due to her renal function.    Code Status:  partial code blue  Family Communication: no family at bedside  Disposition Plan: home or SNF when stable    Consultants:  renal  Procedures: Echo:ejection fraction was in the range of 50% to 55%, cannot evaluate diatolic function    Antibiotics:  none  HPI/Subjective: Complaining of abdominal pain.  Objective: Filed Vitals:   01/05/13 2100 01/06/13 0508 01/06/13 0900 01/06/13 0933  BP:  150/99  152/71  Pulse: 104 97  103  Temp:  97.7 F (36.5 C)  98.6 F (37 C)  TempSrc:  Oral  Oral  Resp: 18 16  18   Height:      Weight:   119.2 kg (262 lb 12.6 oz)   SpO2: 96% 99%  98%    Intake/Output Summary (Last 24 hours) at 01/06/13 1404 Last data filed at 01/06/13 0900  Gross per 24 hour  Intake   1682 ml  Output   1200 ml  Net    482 ml   Filed Weights   01/04/13 0900 01/05/13 0936 01/06/13 0900  Weight: 118.6 kg (261 lb 7.5 oz) 118.8 kg (261 lb 14.5 oz) 119.2 kg (262 lb 12.6 oz)    Exam:  General: Alert, awake, oriented x3, in no acute distress.  HEENT: No bruits, no goiter. + JVD Heart: Regular rate and rhythm, without murmurs, rubs, gallops.  Lungs: Good air movement, bilateral air movement.  Abdomen: Soft, nontender, nondistended,  positive bowel sounds. Anasarca EXT: 3 + edema   Data Reviewed: Basic Metabolic Panel:  Recent Labs Lab 01/02/13 1511 01/03/13 0800 01/04/13 0626 01/05/13 0530 01/06/13 0655  NA 141 140 139 140 139  K 3.9 3.8 3.5 3.4* 3.3*  CL 93* 91* 93* 91* 89*  CO2 41* 40* 40* 45* 44*  GLUCOSE 122* 154* 155* 157* 152*  BUN 43* 43* 47* 48* 47*  CREATININE 1.65* 1.62* 1.86* 2.01* 1.87*  CALCIUM 10.1 10.0 9.7 9.5 10.0  MG  --  2.0  --   --   --   PHOS  --  3.4  --   --   --    Liver Function Tests:  Recent Labs Lab 01/03/13 0800  AST 20  ALT 12  ALKPHOS 163*  BILITOT 0.8  PROT 7.1  ALBUMIN 4.2   No results found for this basename: LIPASE, AMYLASE,  in the last 168 hours No results found for this basename: AMMONIA,  in the  last 168 hours CBC:  Recent Labs Lab 01/02/13 1511 01/03/13 0800 01/04/13 0626 01/05/13 0530 01/06/13 0655  WBC 5.6 6.1 6.0 5.6 5.1  NEUTROABS 4.3  --   --   --   --   HGB 10.4* 10.3* 9.5* 9.0* 9.7*  HCT 33.9* 34.0* 31.7* 29.5* 30.8*  MCV 88.3 89.0 89.5 88.3 86.8  PLT 95* 99* 84* 86* 87*   Cardiac Enzymes:  Recent Labs Lab 01/02/13 2354 01/03/13 0800 01/03/13 1135  TROPONINI <0.30 <0.30 <0.30   BNP (last 3 results)  Recent Labs  01/03/13 0800  PROBNP 4706.0*   CBG:  Recent Labs Lab 01/05/13 1120 01/05/13 1650 01/05/13 2139 01/06/13 0751 01/06/13 1130  GLUCAP 166* 189* 185* 135* 182*    Recent Results (from the past 240 hour(s))  MRSA PCR SCREENING     Status: None   Collection Time    01/03/13  1:03 AM      Result Value Range Status   MRSA by PCR NEGATIVE  NEGATIVE Final   Comment:            The GeneXpert MRSA Assay (FDA     approved for NASAL specimens     only), is one component of a     comprehensive MRSA colonization     surveillance program. It is not     intended to diagnose MRSA     infection nor to guide or     monitor treatment for     MRSA infections.  URINE CULTURE     Status: None   Collection Time    01/03/13  3:06 AM      Result Value Range Status   Specimen Description URINE, CATHETERIZED   Final   Special Requests ADDED 0337   Final   Culture  Setup Time 01/03/2013 03:54   Final   Colony Count >=100,000 COLONIES/ML   Final   Culture GRAM NEGATIVE RODS   Final   Report Status PENDING   Incomplete     Studies: No results found.  Scheduled Meds: . allopurinol  100 mg Oral Daily  . amiodarone  200 mg Oral Daily  . busPIRone  5 mg Oral Q12H  . carvedilol  25 mg Oral BID WC  . docusate sodium  100 mg Oral Q8H  . feeding supplement (NEPRO CARB STEADY)  237 mL Oral BID BM  . furosemide  160 mg Intravenous Q6H  . guaiFENesin  600 mg Oral BID  . insulin aspart  0-5  Units Subcutaneous QHS  . insulin aspart  0-9 Units  Subcutaneous TID WC  . insulin glargine  5 Units Subcutaneous QHS  . isosorbide dinitrate  20 mg Oral Q8H  . metolazone  10 mg Oral Daily  . OxyCODONE  10 mg Oral Q12H  . pantoprazole  40 mg Oral Daily  . polyethylene glycol  17 g Oral BID  . sodium chloride  3 mL Intravenous Q12H  . spironolactone  25 mg Oral Daily  . traZODone  50 mg Oral QHS  . warfarin  1.5 mg Oral ONCE-1800  . Warfarin - Pharmacist Dosing Inpatient   Does not apply q1800   Continuous Infusions:    Marinda Elk  [facility] Pager [phone]. If 8PM-8AM, please contact night-coverage at [email or web address], password [facility] 01/06/2013, 2:04 PM  LOS: 4 days

## 2013-01-07 DIAGNOSIS — I1310 Hypertensive heart and chronic kidney disease without heart failure, with stage 1 through stage 4 chronic kidney disease, or unspecified chronic kidney disease: Secondary | ICD-10-CM

## 2013-01-07 DIAGNOSIS — I5033 Acute on chronic diastolic (congestive) heart failure: Secondary | ICD-10-CM

## 2013-01-07 DIAGNOSIS — N19 Unspecified kidney failure: Secondary | ICD-10-CM

## 2013-01-07 DIAGNOSIS — E876 Hypokalemia: Secondary | ICD-10-CM

## 2013-01-07 LAB — BASIC METABOLIC PANEL
BUN: 47 mg/dL — ABNORMAL HIGH (ref 6–23)
Calcium: 9.7 mg/dL (ref 8.4–10.5)
Chloride: 90 mEq/L — ABNORMAL LOW (ref 96–112)
Creatinine, Ser: 1.69 mg/dL — ABNORMAL HIGH (ref 0.50–1.10)
GFR calc Af Amer: 31 mL/min — ABNORMAL LOW (ref >90)
GFR calc non Af Amer: 27 mL/min — ABNORMAL LOW (ref >90)

## 2013-01-07 LAB — MAGNESIUM: Magnesium: 1.8 mg/dL (ref 1.5–2.5)

## 2013-01-07 LAB — GLUCOSE, CAPILLARY: Glucose-Capillary: 282 mg/dL — ABNORMAL HIGH (ref 70–99)

## 2013-01-07 LAB — SODIUM, URINE, RANDOM: Sodium, Ur: 104 mEq/L

## 2013-01-07 LAB — CREATININE, URINE, RANDOM: Creatinine, Urine: 17.73 mg/dL

## 2013-01-07 LAB — PROTIME-INR
INR: 3.42 — ABNORMAL HIGH (ref 0.00–1.49)
Prothrombin Time: 32.6 seconds — ABNORMAL HIGH (ref 11.6–15.2)

## 2013-01-07 LAB — CARBOXYHEMOGLOBIN: Carboxyhemoglobin: 1.5 % (ref 0.5–1.5)

## 2013-01-07 MED ORDER — FUROSEMIDE 10 MG/ML IJ SOLN
10.0000 mg/h | INTRAVENOUS | Status: DC
  Administered 2013-01-07 – 2013-01-09 (×4): 20 mg/h via INTRAVENOUS
  Administered 2013-01-10 – 2013-01-11 (×2): 10 mg/h via INTRAVENOUS
  Filled 2013-01-07 (×12): qty 25

## 2013-01-07 MED ORDER — INSULIN GLARGINE 100 UNIT/ML ~~LOC~~ SOLN
10.0000 [IU] | Freq: Every day | SUBCUTANEOUS | Status: DC
  Administered 2013-01-07: 10 [IU] via SUBCUTANEOUS

## 2013-01-07 NOTE — Progress Notes (Signed)
Orthopedic Tech Progress Note Patient Details:  [name] May 23, 1927 [id number]  Patient ID: Gina Ashley, female   DOB: 12-11-1926, 79 y.o.   MRN: [id number] Trapeze bar patient helper  Nikki Dom 01/07/2013, 1:43 PM

## 2013-01-07 NOTE — Progress Notes (Signed)
Clinical Social Worker met with pt at bedside. CSW provided list of bed offers.  CSW to continue to follow and assist as needed.    Angelia Mould, MSW, [place] [phone]

## 2013-01-07 NOTE — Progress Notes (Signed)
Utilization Review Completed. 01/07/2013  

## 2013-01-07 NOTE — Progress Notes (Signed)
[facility] PROGRESS NOTE Interim History: The patient is a 79 y.o. year-old with hx of morbid obesity, HTN (all her life), CKD III, afib, OSA on CPAP and DM2 x 10-15 years presenting with marked anasarca to ED. She has been either in hospitals or SNF"s or LTAC's for the last 6 months. She fell and broke her R femur and L lower leg requiring surgical repair during this time. She has gotten temporary dialysis during this time, details not available. Creat is 1.6. She says swelling of abdomen and legs has been there for 7 days. Denies SOB, n/v/d, CP or cough, orthopnea. No fam hx of kidney disease. No NSAID"S. Non ambulatory    Assessment/Plan:  Acute systolic congestive heart failure/Chronic diastolic heart failure: -  appriciate HF team assistance. - on lasix 160 QID. On metolazone 10. - improved UOP On milrinone and diuretics. Cr improving - ECHO reviewed showed moderate dilation of right ventricle. .- 99 kg back 10.24.2014 no 119 kg. - Pending Urinary Na, place a PICC line.  DM with complications (CKD), controlled : - Hemoglobin A1c on this admission 6.8  - Continue sliding scale insulin  - Insulin Glargine 5 units Q HS     HYPERTENSION : - cont. Bidil - coreg 25 mg PO BID, spironolactone 25 mg daily  Depression: - Continue buspirone   Obesity (BMI 30-39.9) - counseling.  CKD (chronic kidney disease) - baslein cr 1.7    Anemia due to chronic illness - per renal.   PAF (paroxysmal atrial fibrillation), was in SR 06/2012  - Coumadin per pharmacy  - Continue amiodarone and coreg  - INR high   OBSTRUCTIVE SLEEP APNEA - c-pap   HYPERTENSION: - dc'd isordil 20 mg O Q 8 Hours, cont. coreg 25 mg PO BID, spironolactone 25 mg daily - start bidil, due to her renal function.    Code Status: partial code blue  Family Communication: no family at bedside  Disposition Plan: home or SNF when stable    Consultants:  renal  Procedures: Echo:ejection fraction was in the  range of 50% to 55%, cannot evaluate diatolic function    Antibiotics:  none  HPI/Subjective: Abdominal pain improved. Wants to eat. Still massively overloaded.  Objective: Filed Vitals:   01/07/13 0200 01/07/13 0300 01/07/13 0428 01/07/13 0500  BP: 124/90 147/80 115/75 113/62  Pulse: 122 100 99 91  Temp:   98.6 F (37 C)   TempSrc:   Axillary   Resp: 13 16 16 20   Height:      Weight:      SpO2: 98% 100% 99% 100%    Intake/Output Summary (Last 24 hours) at 01/07/13 0738 Last data filed at 01/07/13 9604  Gross per 24 hour  Intake   1104 ml  Output   2350 ml  Net  -1246 ml   Filed Weights   01/05/13 0936 01/06/13 0900 01/06/13 1930  Weight: 118.8 kg (261 lb 14.5 oz) 119.2 kg (262 lb 12.6 oz) 117.5 kg (259 lb 0.7 oz)    Exam:  General: Alert, awake, oriented x3, in no acute distress.  HEENT: No bruits, no goiter. + JVD Heart: Regular rate and rhythm, without murmurs, rubs, gallops.  Lungs: Good air movement, bilateral air movement.  Abdomen: Soft, nontender, nondistended, positive bowel sounds. Anasarca EXT: 3 + edema   Data Reviewed: Basic Metabolic Panel:  Recent Labs Lab 01/02/13 1511 01/03/13 0800 01/04/13 0626 01/05/13 0530 01/06/13 0655  NA 141 140 139 140 139  K 3.9 3.8  3.5 3.4* 3.3*  CL 93* 91* 93* 91* 89*  CO2 41* 40* 40* 45* 44*  GLUCOSE 122* 154* 155* 157* 152*  BUN 43* 43* 47* 48* 47*  CREATININE 1.65* 1.62* 1.86* 2.01* 1.87*  CALCIUM 10.1 10.0 9.7 9.5 10.0  MG  --  2.0  --   --   --   PHOS  --  3.4  --   --   --    Liver Function Tests:  Recent Labs Lab 01/03/13 0800  AST 20  ALT 12  ALKPHOS 163*  BILITOT 0.8  PROT 7.1  ALBUMIN 4.2   No results found for this basename: LIPASE, AMYLASE,  in the last 168 hours No results found for this basename: AMMONIA,  in the last 168 hours CBC:  Recent Labs Lab 01/02/13 1511 01/03/13 0800 01/04/13 0626 01/05/13 0530 01/06/13 0655  WBC 5.6 6.1 6.0 5.6 5.1  NEUTROABS 4.3  --   --    --   --   HGB 10.4* 10.3* 9.5* 9.0* 9.7*  HCT 33.9* 34.0* 31.7* 29.5* 30.8*  MCV 88.3 89.0 89.5 88.3 86.8  PLT 95* 99* 84* 86* 87*   Cardiac Enzymes:  Recent Labs Lab 01/02/13 2354 01/03/13 0800 01/03/13 1135  TROPONINI <0.30 <0.30 <0.30   BNP (last 3 results)  Recent Labs  01/03/13 0800  PROBNP 4706.0*   CBG:  Recent Labs Lab 01/05/13 2139 01/06/13 0751 01/06/13 1130 01/06/13 1612 01/06/13 2140  GLUCAP 185* 135* 182* 198* 220*    Recent Results (from the past 240 hour(s))  MRSA PCR SCREENING     Status: None   Collection Time    01/03/13  1:03 AM      Result Value Range Status   MRSA by PCR NEGATIVE  NEGATIVE Final   Comment:            The GeneXpert MRSA Assay (FDA     approved for NASAL specimens     only), is one component of a     comprehensive MRSA colonization     surveillance program. It is not     intended to diagnose MRSA     infection nor to guide or     monitor treatment for     MRSA infections.  URINE CULTURE     Status: None   Collection Time    01/03/13  3:06 AM      Result Value Range Status   Specimen Description URINE, CATHETERIZED   Final   Special Requests ADDED 0337   Final   Culture  Setup Time 01/03/2013 03:54   Final   Colony Count >=100,000 COLONIES/ML   Final   Culture GRAM NEGATIVE RODS   Final   Report Status PENDING   Incomplete     Studies: Dg Chest Port 1 View  01/06/2013  *RADIOLOGY REPORT*  Clinical Data: If placement.  PORTABLE CHEST - 1 VIEW  Comparison: Chest radiograph performed 01/02/2013  Findings: The right PICC is noted ending about the distal SVC.  There is persistent bibasilar and right midlung zone airspace opacification, with underlying vascular congestion, most compatible with pulmonary edema.  Moderate right and small left pleural effusions are grossly unchanged in appearance.  No pneumothorax is seen.  The cardiomediastinal silhouette is enlarged.  No acute osseous abnormalities are identified.   IMPRESSION:  1.  Right PICC noted ending about the distal SVC. 2.  Relatively stable appearance to pulmonary edema, with moderate right and small left pleural effusions, as on  the prior study. 3.  Underlying vascular congestion and cardiomegaly.   Original Report Authenticated By: Tonia Ghent, M.D.     Scheduled Meds: . allopurinol  100 mg Oral Daily  . amiodarone  200 mg Oral BID  . busPIRone  5 mg Oral Q12H  . carvedilol  25 mg Oral BID WC  . docusate sodium  100 mg Oral Q8H  . feeding supplement (NEPRO CARB STEADY)  237 mL Oral BID BM  . furosemide  160 mg Intravenous Q6H  . guaiFENesin  600 mg Oral BID  . insulin aspart  0-5 Units Subcutaneous QHS  . insulin aspart  0-9 Units Subcutaneous TID WC  . insulin glargine  5 Units Subcutaneous QHS  . isosorbide-hydrALAZINE  1 tablet Oral BID  . metolazone  10 mg Oral Daily  . OxyCODONE  10 mg Oral Q12H  . pantoprazole  40 mg Oral Daily  . polyethylene glycol  17 g Oral BID  . potassium chloride  40 mEq Oral BID  . sodium chloride  10-40 mL Intracatheter Q12H  . sodium chloride  3 mL Intravenous Q12H  . spironolactone  25 mg Oral Daily  . traZODone  50 mg Oral QHS  . Warfarin - Pharmacist Dosing Inpatient   Does not apply q1800   Continuous Infusions: . milrinone 0.249 mcg/kg/min (01/07/13 0700)     Marinda Elk  [facility] Pager [phone]. If 8PM-8AM, please contact night-coverage at [email or web address], password [facility] 01/07/2013, 7:38 AM  LOS: 5 days

## 2013-01-07 NOTE — Progress Notes (Signed)
Clinical Social Worker met with pt and pt's dtr at bedside.  CSW provided opportunity for pt and dtr to address questions/concerns.  Pt and dtr have selected [facility] for ST-SNF.  CSW left message with Bjorn Loser at [facility].  CSW to continue to follow and assist as needed.    Angelia Mould, MSW, [place] [phone]

## 2013-01-07 NOTE — Progress Notes (Signed)
ANTICOAGULATION CONSULT NOTE - Follow Up Consult  Pharmacy Consult for Warfarin  Indication: atrial fibrillation  Allergies  Allergen Reactions  . Morphine Sulfate Other (See Comments)    REACTION: change in personality  . Remeron (Mirtazapine) Other (See Comments)    Altered mental status, lethargy  . Tuna (Fish Allergy) Other (See Comments)    unknown  . Penicillins Rash    Tolerates Ancef.    Patient Measurements: Height: 5\' 3"  (160 cm) Weight: 259 lb 0.7 oz (117.5 kg) IBW/kg (Calculated) : 52.4  Vital Signs: Temp: 98.6 F (37 C) (03/06 0428) Temp src: Axillary (03/06 0428) BP: 113/62 mmHg (03/06 0500) Pulse Rate: 91 (03/06 0500)  Labs:  Recent Labs  01/05/13 0530 01/06/13 0655 01/07/13 0615  HGB 9.0* 9.7*  --   HCT 29.5* 30.8*  --   PLT 86* 87*  --   LABPROT 25.9* 28.9* 32.6*  INR 2.51* 2.91* 3.42*  CREATININE 2.01* 1.87*  --     Estimated Creatinine Clearance: 28.2 ml/min (by C-G formula based on Cr of 1.87).   Medications:  Scheduled:  . allopurinol  100 mg Oral Daily  . amiodarone  200 mg Oral BID  . busPIRone  5 mg Oral Q12H  . carvedilol  25 mg Oral BID WC  . docusate sodium  100 mg Oral Q8H  . feeding supplement (NEPRO CARB STEADY)  237 mL Oral BID BM  . furosemide  160 mg Intravenous Q6H  . guaiFENesin  600 mg Oral BID  . insulin aspart  0-5 Units Subcutaneous QHS  . insulin aspart  0-9 Units Subcutaneous TID WC  . insulin glargine  5 Units Subcutaneous QHS  . isosorbide-hydrALAZINE  1 tablet Oral BID  . metolazone  10 mg Oral Daily  . OxyCODONE  10 mg Oral Q12H  . pantoprazole  40 mg Oral Daily  . polyethylene glycol  17 g Oral BID  . [COMPLETED] potassium chloride  40 mEq Oral Once  . potassium chloride  40 mEq Oral BID  . sodium chloride  10-40 mL Intracatheter Q12H  . sodium chloride  3 mL Intravenous Q12H  . spironolactone  25 mg Oral Daily  . traZODone  50 mg Oral QHS  . [COMPLETED] warfarin  1.5 mg Oral ONCE-1800  . Warfarin -  Pharmacist Dosing Inpatient   Does not apply q1800  . [DISCONTINUED] amiodarone  200 mg Oral Daily  . [DISCONTINUED] isosorbide dinitrate  20 mg Oral Q8H    Assessment: 79 y/o F who continues of warfarin for Afib. INR on admit was sub-therapeutic at 1.76. INR today is 3.42<2.91<2.51. H/H is low and stable, no bleeding reported. Scr 1.87<2.01, CrCl ~4mL/min. Noted DDI with amiodarone. INR continues to rise likely due to combination of extra doses on 3/2 and acute illness with fluid overload/congestion now needing milrinone.   Goal of Therapy:   INR 2-3 Monitor platelets by anticoagulation protocol: Yes   Plan:   -Hold warfarin tonight -Daily PT/INR -Monitor for bleeding  Thank you for the consult,   Abran Duke, PharmD Clinical Pharmacist Phone: [phone] Pager: [phone] 01/07/2013 7:29 AM

## 2013-01-07 NOTE — Progress Notes (Signed)
CRITICAL VALUE ALERT  Critical value received:  CO2:44  Date of notification:  T  Time of notification:  0735  Critical value read back:yes  Nurse who received alert:  Fransisco Hertz, RN   MD notified (1st page):  MD on floor  Time of first page:  [phone]  MD notified (2nd page):  Time of second page:  Responding MD:  MD on floor  Time MD responded:  0749 No new orders

## 2013-01-07 NOTE — Progress Notes (Signed)
[facility] - PROGRESS NOTE Resident Note   Please see below for attending addendum to resident note.  Subjective:   The patient is a 79 y.o. year-old with hx of morbid obesity, HTN (all her life), CKD III, afib, OSA on CPAP and DM2 x 10-15 years presenting with marked anasarca to ED. She has been either in hospitals or SNF"s or LTAC's for the last 6 months. She fell and broke her R femur and L lower leg requiring surgical repair during this time. She has gotten temporary dialysis during this time, details not available. Creat is 1.6. She says swelling of abdomen and legs has been there for 7 days. Denies SOB, n/v/d, CP or cough, orthopnea. No fam hx of kidney disease. No NSAID"S. Non ambulatory. ( patient has foley catheter inserted since 12/01/12 from outside facility)  UA shows no protein, high pH 7.5  Serum HCO3 is 40  CXR bilat effusions +/- edema  ABG's from 2013 with pCO2 60-70 range  Creatinine in last 8 mos runs between 1.1- 2.7   Patient was started aggressive diuretic therapy Lasix 120 mg then increased to160 mg IV every 6 hour. Metolazone increased to 10 to facilitate diuresis.  HF team consulted 01/06/13 per primary care team. Milrinone drip started.  Patient feels better overnight. UOP ~2348ml.  Weight down 3 lbs overnight.   Objective:    Vital Signs:   Temp:  [97.4 F (36.3 C)-98.7 F (37.1 C)] 97.4 F (36.3 C) (03/06 0740) Pulse Rate:  [78-122] 94 (03/06 0800) Resp:  [12-22] 12 (03/06 0800) BP: (102-152)/(62-98) 117/73 mmHg (03/06 0800) SpO2:  [95 %-100 %] 100 % (03/06 0800) FiO2 (%):  [40 %] 40 % (03/06 0032) Weight:  [259 lb 0.7 oz (117.5 kg)-262 lb 12.6 oz (119.2 kg)] 259 lb 0.7 oz (117.5 kg) (03/05 1930) Last BM Date: 01/05/13  24-hour weight change: Weight change: 14.1 oz (0.4 kg)  Weight trends: Filed Weights   01/05/13 0936 01/06/13 0900 01/06/13 1930  Weight: 261 lb 14.5 oz (118.8 kg) 262 lb 12.6 oz (119.2 kg) 259 lb 0.7 oz (117.5 kg)     Intake/Output:  03/05 0701 - 03/06 0700 In: 1104 [P.O.:840; IV Piggyback:264] Out: 2350 [Urine:2350]  Physical Exam:  Gen: alert, elderly pleasant female  Skin: no rash, cyanosis  HEENT: EOMI, sclera anicteric, throat clear  Neck: ++ JVD ( she had PICC line and temporary HD catheter in her neck), no LAN  Chest: bilateral basilar rales  CV: regular, no rub or gallop, no carotid bruits  Abdomen: soft, obese, marked abd wall edema in lateral areas  Ext: 2-3+ pretibial and thigh edema bilat better than yesterday, no joint effusion or deformity, no gangrene or ulceration  Neuro: alert, Ox3, no focal deficit, no asterixis    Labs: Basic Metabolic Panel:  Recent Labs Lab 01/03/13 0800  01/05/13 0530 01/06/13 0655 01/07/13 0615  NA 140  < > 140 139 140  K 3.8  < > 3.4* 3.3* 3.5  CL 91*  < > 91* 89* 90*  CO2 40*  < > 45* 44* 44*  GLUCOSE 154*  < > 157* 152* 126*  BUN 43*  < > 48* 47* 47*  CREATININE 1.62*  < > 2.01* 1.87* 1.69*  CALCIUM 10.0  < > 9.5 10.0 9.7  MG 2.0  --   --   --  1.8  PHOS 3.4  --   --   --   --   < > = values in  this interval not displayed.  Liver Function Tests:  Recent Labs Lab 01/03/13 0800  AST 20  ALT 12  ALKPHOS 163*  BILITOT 0.8  PROT 7.1  ALBUMIN 4.2   No results found for this basename: LIPASE, AMYLASE,  in the last 168 hours No results found for this basename: AMMONIA,  in the last 168 hours  CBC:  Recent Labs Lab 01/02/13 1511  01/04/13 0626 01/05/13 0530 01/06/13 0655  WBC 5.6  < > 6.0 5.6 5.1  NEUTROABS 4.3  --   --   --   --   HGB 10.4*  < > 9.5* 9.0* 9.7*  HCT 33.9*  < > 31.7* 29.5* 30.8*  MCV 88.3  < > 89.5 88.3 86.8  PLT 95*  < > 84* 86* 87*  < > = values in this interval not displayed.  Cardiac Enzymes:  Recent Labs Lab 01/02/13 2354 01/03/13 0800 01/03/13 1135  TROPONINI <0.30 <0.30 <0.30    BNP: No components found with this basename: POCBNP,   CBG:  Recent Labs Lab 01/05/13 2139 01/06/13 0751  01/06/13 1130 01/06/13 1612 01/06/13 2140  GLUCAP 185* 135* 182* 198* 220*    Microbiology: Results for orders placed during the hospital encounter of 01/02/13  MRSA PCR SCREENING     Status: None   Collection Time    01/03/13  1:03 AM      Result Value Range Status   MRSA by PCR NEGATIVE  NEGATIVE Final   Comment:            The GeneXpert MRSA Assay (FDA     approved for NASAL specimens     only), is one component of a     comprehensive MRSA colonization     surveillance program. It is not     intended to diagnose MRSA     infection nor to guide or     monitor treatment for     MRSA infections.  URINE CULTURE     Status: None   Collection Time    01/03/13  3:06 AM      Result Value Range Status   Specimen Description URINE, CATHETERIZED   Final   Special Requests ADDED 0337   Final   Culture  Setup Time 01/03/2013 03:54   Final   Colony Count >=100,000 COLONIES/ML   Final   Culture GRAM NEGATIVE RODS   Final   Report Status PENDING   Incomplete    Coagulation Studies:  Recent Labs  01/05/13 0530 01/06/13 0655 01/07/13 0615  LABPROT 25.9* 28.9* 32.6*  INR 2.51* 2.91* 3.42*    Urinalysis:    Component Value Date/Time   COLORURINE YELLOW 01/03/2013 0306   APPEARANCEUR CLEAR 01/03/2013 0306   LABSPEC 1.010 01/03/2013 0306   PHURINE 6.0 01/03/2013 0306   GLUCOSEU NEGATIVE 01/03/2013 0306   HGBUR MODERATE* 01/03/2013 0306   BILIRUBINUR NEGATIVE 01/03/2013 0306   KETONESUR NEGATIVE 01/03/2013 0306   PROTEINUR NEGATIVE 01/03/2013 0306   UROBILINOGEN 0.2 01/03/2013 0306   NITRITE NEGATIVE 01/03/2013 0306   LEUKOCYTESUR MODERATE* 01/03/2013 0306     Imaging: Dg Chest Port 1 View  01/06/2013  *RADIOLOGY REPORT*  Clinical Data: If placement.  PORTABLE CHEST - 1 VIEW  Comparison: Chest radiograph performed 01/02/2013  Findings: The right PICC is noted ending about the distal SVC.  There is persistent bibasilar and right midlung zone airspace opacification, with underlying vascular  congestion, most compatible with pulmonary edema.  Moderate right and small left pleural effusions  are grossly unchanged in appearance.  No pneumothorax is seen.  The cardiomediastinal silhouette is enlarged.  No acute osseous abnormalities are identified.  IMPRESSION:  1.  Right PICC noted ending about the distal SVC. 2.  Relatively stable appearance to pulmonary edema, with moderate right and small left pleural effusions, as on the prior study. 3.  Underlying vascular congestion and cardiomegaly.   Original Report Authenticated By: Tonia Ghent, M.D.       Medications:    Infusions: . milrinone 0.249 mcg/kg/min (01/07/13 0700)    Scheduled Medications: . allopurinol  100 mg Oral Daily  . amiodarone  200 mg Oral BID  . busPIRone  5 mg Oral Q12H  . carvedilol  25 mg Oral BID WC  . docusate sodium  100 mg Oral Q8H  . feeding supplement (NEPRO CARB STEADY)  237 mL Oral BID BM  . furosemide  160 mg Intravenous Q6H  . guaiFENesin  600 mg Oral BID  . insulin aspart  0-5 Units Subcutaneous QHS  . insulin aspart  0-9 Units Subcutaneous TID WC  . insulin glargine  5 Units Subcutaneous QHS  . isosorbide-hydrALAZINE  1 tablet Oral BID  . metolazone  10 mg Oral Daily  . OxyCODONE  10 mg Oral Q12H  . pantoprazole  40 mg Oral Daily  . polyethylene glycol  17 g Oral BID  . potassium chloride  40 mEq Oral BID  . sodium chloride  10-40 mL Intracatheter Q12H  . sodium chloride  3 mL Intravenous Q12H  . spironolactone  25 mg Oral Daily  . traZODone  50 mg Oral QHS  . Warfarin - Pharmacist Dosing Inpatient   Does not apply q1800    PRN Medications: sodium chloride, acetaminophen, albuterol, alum & mag hydroxide-simeth, diphenhydrAMINE, HYDROcodone-acetaminophen, ipratropium, lactulose, ondansetron (ZOFRAN) IV, oxyCODONE, simethicone, sodium chloride, sodium chloride   Assessment/ Plan:    The patient is a 79 y.o. year-old with hx of morbid obesity, HTN (all her life), CKD III, afib, OSA on  CPAP and DM2 x 10-15 years presenting with marked anasarca to ED. She has been either in hospitals or SNF"s or LTAC's for the last 6 months. She fell and broke her R femur and L lower leg requiring surgical repair during this time. She has gotten temporary dialysis during this time, details not available. Creat is 1.6. She says swelling of abdomen and legs has been there for 7 days. Denies SOB, n/v/d, CP or cough, orthopnea. No fam hx of kidney disease. No NSAID"S. Non ambulatory. Renal service was consulted for edema.    1. A/C heart failure with anasarca, Pro BNP > 4000  No proteinuria, severe renal failure or cirrhosis (albumin 4).  The etiology of her anasarca is most likely associated with A/C heat failure. Likely left and right heart failure given her pulmonary interstitial edema and severe anasarca. Poor candidate for RRT with comorbidities.   - Echo--LVEF 50-55%, severely dilated LA and moderate dilate RA   Recent Labs Lab 01/03/13 0800 01/04/13 0626 01/05/13 0530 01/06/13 0655 01/07/13 0615  CREATININE 1.62* 1.86* 2.01* 1.87* 1.69*   - feel better, increased UOP and weight down - On Lasix 160 every 6 Hours and Metolazone 10 daily - Milrinone started per HF team. - Appreciate HF team consult - Renal will sign off. - Thanks to letting us consult on this pleasant lady. Please call Renal if needed.  2. Pyuria--Ucx GNR> 100,000, likely colonized from long term foley catheter since 12/01/12.  -replaced foley catherter,  defer to primary team.   3. CKD III  4. Anemia, chronic  5. Morbid obesity  6. HTN  7. DM, type2. A1C 6.8  8. Limited code  9. Hypokalemia    Length of Stay: 5 days  Patient history and plan of care reviewed with attending, Dr. Briant Cedar.   Dede Query, MD  PGYII, Internal Medicine Resident 01/07/2013, 8:17 AM I have seen and examined this patient and agree with plan per Dr Ian Bushman.  Since cardiology is now involved and her volume issues are cardiac related, I will  sign off. I think diuretics and ionotropes will be the best long term therapy, not dialysis.Marland Kitchen MATTINGLY,MICHAEL T,MD 01/07/2013 10:34 AM

## 2013-01-07 NOTE — Progress Notes (Addendum)
Advanced Heart Failure Rounding Note   Subjective:    Gina Ashley is an 79 y.o. female with history of chronic atrial fibrillation on coumadin, diastolic heart failure and HTN. She also has DM type 2, COPD, OSA on CPAP and renal failure with baseline Cr 1.5-1.7. She has had 4 hospitalizations in the last 6 months, 1 included tibia/fibula fracture and the rest have been due to massive fluid overload in the setting of diastolic heart failure. She has required short term dialysis in the past for renal failure. She currently resides at [facility] and has noted progressive dyspnea and edema over the last 2 weeks. Patient suspects she may have 20-30 pounds of fluid on board.  Echo with RV dysfunction and low urine output therefore started on milrinone 0.25 mcg/kg/min.  Co-ox 81%.   CVP 13.  Milrinone started at 1 this morning. Renal function and urine output improving.  Feels better but still has difficulty with food.  +orthopnea.  No PND.   Objective:   Weight Range:  Vital Signs:   Temp:  [97.4 F (36.3 C)-98.7 F (37.1 C)] 97.4 F (36.3 C) (03/06 0740) Pulse Rate:  [78-122] 94 (03/06 0800) Resp:  [12-22] 12 (03/06 0800) BP: (102-152)/(62-98) 117/73 mmHg (03/06 0800) SpO2:  [95 %-100 %] 100 % (03/06 0800) FiO2 (%):  [40 %] 40 % (03/06 0032) Weight:  [117.5 kg (259 lb 0.7 oz)] 117.5 kg (259 lb 0.7 oz) (03/05 1930) Last BM Date: 01/05/13  Weight change: Filed Weights   01/05/13 0936 01/06/13 0900 01/06/13 1930  Weight: 118.8 kg (261 lb 14.5 oz) 119.2 kg (262 lb 12.6 oz) 117.5 kg (259 lb 0.7 oz)    Intake/Output:   Intake/Output Summary (Last 24 hours) at 01/07/13 0911 Last data filed at 01/07/13 0745  Gross per 24 hour  Intake    864 ml  Output   2300 ml  Net  -1436 ml     Physical Exam: General: Chronically ill appearing. No resp difficulty on n/c  HEENT: normal  Neck: supple. JVP jaw . Carotids 2+ bilat; no bruits. No lymphadenopathy or thryomegaly appreciated.  Cor:  Distant. Regular rate & rhythm. No rubs, gallops or murmurs.  Lungs: decreased throughout  Abdomen: obese, soft, nontender, ++ distention. No hepatosplenomegaly. No bruits or masses. Good bowel sounds.  Extremities: no cyanosis, clubbing, rash, 3+ thigh edema. Well healing scar left proximal tibia  Neuro: alert & orientedx3, cranial nerves grossly intact. moves all 4 extremities w/o difficulty. Affect pleasant   Telemetry: atrial flutter  Labs: Basic Metabolic Panel:  Recent Labs Lab 01/03/13 0800 01/04/13 0626 01/05/13 0530 01/06/13 0655 01/07/13 0615  NA 140 139 140 139 140  K 3.8 3.5 3.4* 3.3* 3.5  CL 91* 93* 91* 89* 90*  CO2 40* 40* 45* 44* 44*  GLUCOSE 154* 155* 157* 152* 126*  BUN 43* 47* 48* 47* 47*  CREATININE 1.62* 1.86* 2.01* 1.87* 1.69*  CALCIUM 10.0 9.7 9.5 10.0 9.7  MG 2.0  --   --   --  1.8  PHOS 3.4  --   --   --   --     Liver Function Tests:  Recent Labs Lab 01/03/13 0800  AST 20  ALT 12  ALKPHOS 163*  BILITOT 0.8  PROT 7.1  ALBUMIN 4.2   No results found for this basename: LIPASE, AMYLASE,  in the last 168 hours No results found for this basename: AMMONIA,  in the last 168 hours  CBC:  Recent Labs Lab  01/02/13 1511 01/03/13 0800 01/04/13 0626 01/05/13 0530 01/06/13 0655  WBC 5.6 6.1 6.0 5.6 5.1  NEUTROABS 4.3  --   --   --   --   HGB 10.4* 10.3* 9.5* 9.0* 9.7*  HCT 33.9* 34.0* 31.7* 29.5* 30.8*  MCV 88.3 89.0 89.5 88.3 86.8  PLT 95* 99* 84* 86* 87*    Cardiac Enzymes:  Recent Labs Lab 01/02/13 2354 01/03/13 0800 01/03/13 1135  TROPONINI <0.30 <0.30 <0.30    BNP: BNP (last 3 results)  Recent Labs  01/03/13 0800  PROBNP 4706.0*      Imaging: Dg Chest Port 1 View  01/06/2013  *RADIOLOGY REPORT*  Clinical Data: If placement.  PORTABLE CHEST - 1 VIEW  Comparison: Chest radiograph performed 01/02/2013  Findings: The right PICC is noted ending about the distal SVC.  There is persistent bibasilar and right midlung zone  airspace opacification, with underlying vascular congestion, most compatible with pulmonary edema.  Moderate right and small left pleural effusions are grossly unchanged in appearance.  No pneumothorax is seen.  The cardiomediastinal silhouette is enlarged.  No acute osseous abnormalities are identified.  IMPRESSION:  1.  Right PICC noted ending about the distal SVC. 2.  Relatively stable appearance to pulmonary edema, with moderate right and small left pleural effusions, as on the prior study. 3.  Underlying vascular congestion and cardiomegaly.   Original Report Authenticated By: Tonia Ghent, M.D.      Medications:     Scheduled Medications: . allopurinol  100 mg Oral Daily  . amiodarone  200 mg Oral BID  . busPIRone  5 mg Oral Q12H  . carvedilol  25 mg Oral BID WC  . docusate sodium  100 mg Oral Q8H  . feeding supplement (NEPRO CARB STEADY)  237 mL Oral BID BM  . furosemide  160 mg Intravenous Q6H  . guaiFENesin  600 mg Oral BID  . insulin aspart  0-5 Units Subcutaneous QHS  . insulin aspart  0-9 Units Subcutaneous TID WC  . insulin glargine  5 Units Subcutaneous QHS  . isosorbide-hydrALAZINE  1 tablet Oral BID  . metolazone  10 mg Oral Daily  . OxyCODONE  10 mg Oral Q12H  . pantoprazole  40 mg Oral Daily  . polyethylene glycol  17 g Oral BID  . potassium chloride  40 mEq Oral BID  . sodium chloride  10-40 mL Intracatheter Q12H  . sodium chloride  3 mL Intravenous Q12H  . spironolactone  25 mg Oral Daily  . traZODone  50 mg Oral QHS  . Warfarin - Pharmacist Dosing Inpatient   Does not apply q1800    Infusions: . milrinone 0.249 mcg/kg/min (01/07/13 0700)    PRN Medications: sodium chloride, acetaminophen, albuterol, alum & mag hydroxide-simeth, diphenhydrAMINE, HYDROcodone-acetaminophen, ipratropium, lactulose, ondansetron (ZOFRAN) IV, oxyCODONE, simethicone, sodium chloride, sodium chloride   Assessment:   1. Acute on chronic right sided heart failure  2. Acute on  chronic respiratory failure  3. Acute on chronic renal failure, resolving.  4. HTN  5. Obesity, suspect OHS/OSA  6. Chronic atrial flutter  - on coumadin  7. Deconditioning  Plan/Discussion:    She has massive volume overload with cardiorenal syndrome. Now responding to milrinone. Co-ox looks good. Will switch over to lasix gtt. Suspect she has at least 40-50 pounds to give.Place TED hose. Will need extensive HF education. Once diuresed and off inotropes may need RHC. Continue to follow CVPs and co-ox. Continue coumadin for AFL. Supp K+  Length  of Stay: 5 [place]ward 9:13 AM

## 2013-01-08 LAB — BASIC METABOLIC PANEL
BUN: 50 mg/dL — ABNORMAL HIGH (ref 6–23)
BUN: 48 mg/dL — ABNORMAL HIGH (ref 6–23)
CO2: 45 mEq/L (ref 19–32)
Chloride: 88 mEq/L — ABNORMAL LOW (ref 96–112)
GFR calc Af Amer: 26 mL/min — ABNORMAL LOW (ref >90)
GFR calc non Af Amer: 25 mL/min — ABNORMAL LOW (ref >90)
Glucose, Bld: 203 mg/dL — ABNORMAL HIGH (ref 70–99)
Glucose, Bld: 149 mg/dL — ABNORMAL HIGH (ref 70–99)
Potassium: 3.7 mEq/L (ref 3.5–5.1)
Potassium: 3.6 mEq/L (ref 3.5–5.1)

## 2013-01-08 LAB — CARBOXYHEMOGLOBIN
Carboxyhemoglobin: 2.1 % — ABNORMAL HIGH (ref 0.5–1.5)
O2 Saturation: 77.6 %

## 2013-01-08 LAB — GLUCOSE, CAPILLARY
Glucose-Capillary: 152 mg/dL — ABNORMAL HIGH (ref 70–99)
Glucose-Capillary: 213 mg/dL — ABNORMAL HIGH (ref 70–99)

## 2013-01-08 MED ORDER — ACETAZOLAMIDE SODIUM 500 MG IJ SOLR
500.0000 mg | Freq: Two times a day (BID) | INTRAMUSCULAR | Status: AC
  Administered 2013-01-08 – 2013-01-09 (×4): 500 mg via INTRAVENOUS
  Filled 2013-01-08 (×5): qty 500

## 2013-01-08 MED ORDER — INSULIN GLARGINE 100 UNIT/ML ~~LOC~~ SOLN
10.0000 [IU] | Freq: Once | SUBCUTANEOUS | Status: AC
  Administered 2013-01-08: 10 [IU] via SUBCUTANEOUS

## 2013-01-08 MED ORDER — INSULIN GLARGINE 100 UNIT/ML ~~LOC~~ SOLN
20.0000 [IU] | Freq: Every day | SUBCUTANEOUS | Status: DC
  Administered 2013-01-09: 20 [IU] via SUBCUTANEOUS

## 2013-01-08 MED ORDER — NITROGLYCERIN 0.4 MG SL SUBL
0.4000 mg | SUBLINGUAL_TABLET | SUBLINGUAL | Status: DC | PRN
  Administered 2013-01-09 (×2): 0.4 mg via SUBLINGUAL
  Filled 2013-01-08 (×2): qty 25

## 2013-01-08 MED ORDER — DOPAMINE-DEXTROSE 3.2-5 MG/ML-% IV SOLN
2.0000 ug/kg/min | INTRAVENOUS | Status: DC
  Administered 2013-01-08 – 2013-01-13 (×3): 2 ug/kg/min via INTRAVENOUS
  Filled 2013-01-08 (×3): qty 250

## 2013-01-08 NOTE — Progress Notes (Addendum)
[facility] PROGRESS NOTE Interim History: The patient is a 79 y.o. year-old with hx of morbid obesity, HTN (all her life), CKD III, afib, OSA on CPAP and DM2 x 10-15 years presenting with marked anasarca to ED. She has been either in hospitals or SNF"s or LTAC's for the last 6 months. She fell and broke her R femur and L lower leg requiring surgical repair during this time. She has gotten temporary dialysis during this time, details not available. Creat is 1.6. She says swelling of abdomen and legs has been there for 7 days. Denies SOB, n/v/d, CP or cough, orthopnea. No fam hx of kidney disease. No NSAID"S. Non ambulatory    Assessment/Plan:  Acute systolic congestive heart failure/Chronic diastolic heart failure: -  appriciate HF team assistance. - on lasix drip. Still fluids overloaded UOP decreasing Cr. Trending up. - worsening creatinine. .- 99 kg back 10.24.2014 no 119 kg. - high Urinary Na, obtain while on milrinone and lasix.  - further management per HF team.   PAF (paroxysmal atrial fibrillation), was in [facility] 06/2012 : - tachycardic ranging 110-125, will differ to cards - Coumadin per pharmacy  - Continue amiodarone and coreg - INR high.  DM with complications (CKD), controlled : - Hemoglobin A1c on this admission 6.8  - Continue sliding scale insulin  - Increase Lantus Insulin Glargine 10 units Q HS     HYPERTENSION : - cont. Bidil - coreg 25 mg PO BID, spironolactone 25 mg daily  Depression: - Continue buspirone   Obesity (BMI 30-39.9) - counseling.  CKD (chronic kidney disease) - baslein cr 1.7    Anemia due to chronic illness - per renal.   OBSTRUCTIVE SLEEP APNEA - c-pap   HYPERTENSION: - Coreg 25 mg PO BID, spironolactone 25 mg daily - Cont bidil, due to her renal function.    Code Status: partial code blue  Family Communication: no family at bedside  Disposition Plan: home or SNF when stable     Consultants:  HF  Procedures: Echo:ejection fraction was in the range of 50% to 55%, cannot evaluate diatolic function    Antibiotics:  none  HPI/Subjective: Abdominal pain improved. Pain not made worst with foods massively overloaded.  Objective: Filed Vitals:   01/08/13 0400 01/08/13 0500 01/08/13 0600 01/08/13 0738  BP: 128/66 109/61 116/69 98/56  Pulse: 116 109 111 118  Temp: 97.9 F (36.6 C)   97.3 F (36.3 C)  TempSrc: Oral   Oral  Resp:  10 11 17   Height:      Weight:  117.1 kg (258 lb 2.5 oz)    SpO2: 99% 97% 97% 92%    Intake/Output Summary (Last 24 hours) at 01/08/13 0748 Last data filed at 01/08/13 0700  Gross per 24 hour  Intake  609.3 ml  Output   1502 ml  Net -892.7 ml   Filed Weights   01/06/13 1930 01/07/13 0500 01/08/13 0500  Weight: 117.5 kg (259 lb 0.7 oz) 116.3 kg (256 lb 6.3 oz) 117.1 kg (258 lb 2.5 oz)    Exam:  General: Alert, awake, oriented x3, in no acute distress.  HEENT: No bruits, no goiter. + JVD Heart: Regular rate and rhythm, without murmurs, rubs, gallops.  Lungs: Good air movement, bilateral air movement.  Abdomen: Soft, nontender, nondistended, positive bowel sounds. Anasarca EXT: 3 + edema   Data Reviewed: Basic Metabolic Panel:  Recent Labs Lab 01/03/13 0800 01/04/13 0626 01/05/13 0530 01/06/13 0655 01/07/13 0615 01/08/13 0430  NA 140  139 140 139 140 138  K 3.8 3.5 3.4* 3.3* 3.5 3.7  CL 91* 93* 91* 89* 90* 88*  CO2 40* 40* 45* 44* 44* >45*  GLUCOSE 154* 155* 157* 152* 126* 203*  BUN 43* 47* 48* 47* 47* 50*  CREATININE 1.62* 1.86* 2.01* 1.87* 1.69* 1.94*  CALCIUM 10.0 9.7 9.5 10.0 9.7 9.8  MG 2.0  --   --   --  1.8  --   PHOS 3.4  --   --   --   --   --    Liver Function Tests:  Recent Labs Lab 01/03/13 0800  AST 20  ALT 12  ALKPHOS 163*  BILITOT 0.8  PROT 7.1  ALBUMIN 4.2   No results found for this basename: LIPASE, AMYLASE,  in the last 168 hours No results found for this basename:  AMMONIA,  in the last 168 hours CBC:  Recent Labs Lab 01/02/13 1511 01/03/13 0800 01/04/13 0626 01/05/13 0530 01/06/13 0655  WBC 5.6 6.1 6.0 5.6 5.1  NEUTROABS 4.3  --   --   --   --   HGB 10.4* 10.3* 9.5* 9.0* 9.7*  HCT 33.9* 34.0* 31.7* 29.5* 30.8*  MCV 88.3 89.0 89.5 88.3 86.8  PLT 95* 99* 84* 86* 87*   Cardiac Enzymes:  Recent Labs Lab 01/02/13 2354 01/03/13 0800 01/03/13 1135  TROPONINI <0.30 <0.30 <0.30   BNP (last 3 results)  Recent Labs  01/03/13 0800  PROBNP 4706.0*   CBG:  Recent Labs Lab 01/06/13 2140 01/07/13 0743 01/07/13 1222 01/07/13 1652 01/07/13 2123  GLUCAP 220* 119* 182* 227* 282*    Recent Results (from the past 240 hour(s))  MRSA PCR SCREENING     Status: None   Collection Time    01/03/13  1:03 AM      Result Value Range Status   MRSA by PCR NEGATIVE  NEGATIVE Final   Comment:            The GeneXpert MRSA Assay (FDA     approved for NASAL specimens     only), is one component of a     comprehensive MRSA colonization     surveillance program. It is not     intended to diagnose MRSA     infection nor to guide or     monitor treatment for     MRSA infections.  URINE CULTURE     Status: None   Collection Time    01/03/13  3:06 AM      Result Value Range Status   Specimen Description URINE, CATHETERIZED   Final   Special Requests ADDED 0337   Final   Culture  Setup Time 01/03/2013 03:54   Final   Colony Count >=100,000 COLONIES/ML   Final   Culture GRAM NEGATIVE RODS   Final   Report Status PENDING   Incomplete     Studies: Dg Chest Port 1 View  01/06/2013  *RADIOLOGY REPORT*  Clinical Data: If placement.  PORTABLE CHEST - 1 VIEW  Comparison: Chest radiograph performed 01/02/2013  Findings: The right PICC is noted ending about the distal SVC.  There is persistent bibasilar and right midlung zone airspace opacification, with underlying vascular congestion, most compatible with pulmonary edema.  Moderate right and small left  pleural effusions are grossly unchanged in appearance.  No pneumothorax is seen.  The cardiomediastinal silhouette is enlarged.  No acute osseous abnormalities are identified.  IMPRESSION:  1.  Right PICC noted ending about the  distal SVC. 2.  Relatively stable appearance to pulmonary edema, with moderate right and small left pleural effusions, as on the prior study. 3.  Underlying vascular congestion and cardiomegaly.   Original Report Authenticated By: Tonia Ghent, M.D.     Scheduled Meds: . allopurinol  100 mg Oral Daily  . amiodarone  200 mg Oral BID  . busPIRone  5 mg Oral Q12H  . carvedilol  25 mg Oral BID WC  . docusate sodium  100 mg Oral Q8H  . feeding supplement (NEPRO CARB STEADY)  237 mL Oral BID BM  . guaiFENesin  600 mg Oral BID  . insulin aspart  0-5 Units Subcutaneous QHS  . insulin aspart  0-9 Units Subcutaneous TID WC  . insulin glargine  10 Units Subcutaneous QHS  . isosorbide-hydrALAZINE  1 tablet Oral BID  . metolazone  10 mg Oral Daily  . OxyCODONE  10 mg Oral Q12H  . pantoprazole  40 mg Oral Daily  . polyethylene glycol  17 g Oral BID  . spironolactone  25 mg Oral Daily  . traZODone  50 mg Oral QHS  . Warfarin - Pharmacist Dosing Inpatient   Does not apply q1800   Continuous Infusions: . furosemide (LASIX) infusion 20 mg/hr (01/08/13 0100)  . milrinone 0.249 mcg/kg/min (01/08/13 0100)     Marinda Elk  [facility] Pager [phone]. If 8PM-8AM, please contact night-coverage at [email or web address], password [facility] 01/08/2013, 7:48 AM  LOS: 6 days

## 2013-01-08 NOTE — Progress Notes (Signed)
ANTICOAGULATION CONSULT NOTE - Follow Up Consult  Pharmacy Consult for Warfarin  Indication: atrial fibrillation  Allergies  Allergen Reactions  . Morphine Sulfate Other (See Comments)    REACTION: change in personality  . Remeron (Mirtazapine) Other (See Comments)    Altered mental status, lethargy  . Tuna (Fish Allergy) Other (See Comments)    unknown  . Penicillins Rash    Tolerates Ancef.    Patient Measurements: Height: 5\' 3"  (160 cm) Weight: 258 lb 2.5 oz (117.1 kg) IBW/kg (Calculated) : 52.4  Vital Signs: Temp: 97.3 F (36.3 C) (03/07 0738) Temp src: Oral (03/07 0738) BP: 98/56 mmHg (03/07 0738) Pulse Rate: 118 (03/07 0738)  Labs:  Recent Labs  01/06/13 0655 01/07/13 0615 01/08/13 0430  HGB 9.7*  --   --   HCT 30.8*  --   --   PLT 87*  --   --   LABPROT 28.9* 32.6* 31.0*  INR 2.91* 3.42* 3.20*  CREATININE 1.87* 1.69* 1.94*    Estimated Creatinine Clearance: 27.2 ml/min (by C-G formula based on Cr of 1.94).   Medications:  Scheduled:  . allopurinol  100 mg Oral Daily  . amiodarone  200 mg Oral BID  . busPIRone  5 mg Oral Q12H  . carvedilol  25 mg Oral BID WC  . docusate sodium  100 mg Oral Q8H  . feeding supplement (NEPRO CARB STEADY)  237 mL Oral BID BM  . guaiFENesin  600 mg Oral BID  . insulin aspart  0-5 Units Subcutaneous QHS  . insulin aspart  0-9 Units Subcutaneous TID WC  . [COMPLETED] insulin glargine  10 Units Subcutaneous Once  . insulin glargine  20 Units Subcutaneous QHS  . isosorbide-hydrALAZINE  1 tablet Oral BID  . metolazone  10 mg Oral Daily  . OxyCODONE  10 mg Oral Q12H  . pantoprazole  40 mg Oral Daily  . polyethylene glycol  17 g Oral BID  . [EXPIRED] potassium chloride  40 mEq Oral BID  . spironolactone  25 mg Oral Daily  . traZODone  50 mg Oral QHS  . Warfarin - Pharmacist Dosing Inpatient   Does not apply q1800  . [DISCONTINUED] furosemide  160 mg Intravenous Q6H  . [DISCONTINUED] insulin glargine  10 Units  Subcutaneous QHS  . [DISCONTINUED] insulin glargine  5 Units Subcutaneous QHS  . [DISCONTINUED] sodium chloride  10-40 mL Intracatheter Q12H  . [DISCONTINUED] sodium chloride  3 mL Intravenous Q12H    Assessment: 79 y/o F who continues of warfarin for Afib. INR on admit was sub-therapeutic at 1.76. INR today is 3.2. H/H is low and stable, no bleeding reported.  Noted DDI with amiodarone, dose increased from home dose. INR continues to rise likely due to combination of extra doses on 3/2 and acute illness with fluid overload/congestion now needing milrinone.   Goal of Therapy:   INR 2-3 Monitor platelets by anticoagulation protocol: Yes   Plan:   -Hold warfarin tonight, will likely need to resume 3/8. -Daily PT/INR -Monitor for bleeding  Thank you for the consult,   Tad Moore, BCPS  Clinical Pharmacist Pager [phone]  01/08/2013 9:11 AM

## 2013-01-08 NOTE — Progress Notes (Signed)
  Comment: Patient said to have let arm pain, radiating to chest, as well as chest 'tightness".  Patient has rather complex cardia history,with A. Fib, HTN, CHF, currently severely fluid-overloaded. The main concern, is of possible ACS.   A/P:  1. 12-Lead EKG shows old Q-Waves in anteriror leads, as well as A. Fib. No acute ischemic changes.  2. SL NTG. Patient is intolerant of Morphine. 3. Cycle CEs.  C. Oti. MD, FACP.

## 2013-01-08 NOTE — Care Management (Signed)
Pt took off bipap during the night. Will monator

## 2013-01-08 NOTE — Progress Notes (Signed)
Advanced Heart Failure Rounding Note   Subjective:    Gina Ashley is an 79 y.o. female with history of chronic atrial fibrillation on coumadin, diastolic heart failure and HTN. She also has DM type 2, COPD, OSA on CPAP and renal failure with baseline Cr 1.5-1.7. She has had 4 hospitalizations in the last 6 months, 1 included tibia/fibula fracture and the rest have been due to massive fluid overload in the setting of diastolic heart failure. She has required short term dialysis in the past for renal failure. She currently resides at [facility] and has noted progressive dyspnea and edema over the last 2 weeks.   Echo with RV dysfunction and low urine output therefore started on milrinone 0.25 mcg/kg/min.  I/O down 1L overnight. Weight up 2 pounds.  CVP 14.  Milrinone started 3/5.  Lasix 20 mg/hr and metolazone 10 mg daily.   Cr 1.97>1.69>1.94  Nauseated this morning.  Pain in rt breast and back.  +orthopnea.  +lower extremity edema.  Objective:   Weight Range:  Vital Signs:   Temp:  [97.3 F (36.3 C)-98.2 F (36.8 C)] 97.3 F (36.3 C) (03/07 0738) Pulse Rate:  [91-126] 118 (03/07 0738) Resp:  [10-24] 24 (03/07 0800) BP: (98-136)/(55-106) 125/68 mmHg (03/07 0800) SpO2:  [92 %-100 %] 99 % (03/07 0800) Weight:  [117.1 kg (258 lb 2.5 oz)] 117.1 kg (258 lb 2.5 oz) (03/07 0500) Last BM Date: 01/07/13  Weight change: Filed Weights   01/06/13 1930 01/07/13 0500 01/08/13 0500  Weight: 117.5 kg (259 lb 0.7 oz) 116.3 kg (256 lb 6.3 oz) 117.1 kg (258 lb 2.5 oz)    Intake/Output:   Intake/Output Summary (Last 24 hours) at 01/08/13 1136 Last data filed at 01/08/13 1000  Gross per 24 hour  Intake 1120.4 ml  Output   1502 ml  Net -381.6 ml     Physical Exam: General: Chronically ill appearing. No resp difficulty on n/c  HEENT: normal  Neck: supple. JVP jaw . Carotids 2+ bilat; no bruits. No lymphadenopathy or thryomegaly appreciated.  Cor: Distant. Regular rate & rhythm. No rubs,  gallops or murmurs.  Lungs: decreased throughout  Abdomen: obese, soft, nontender, ++ distention. No hepatosplenomegaly. No bruits or masses. Good bowel sounds.  Extremities: no cyanosis, clubbing, rash, 3+ thigh edema. Well healing scar left proximal tibia  Neuro: alert & orientedx3, cranial nerves grossly intact. moves all 4 extremities w/o difficulty. Affect pleasant   Telemetry: atrial flutter  Labs: Basic Metabolic Panel:  Recent Labs Lab 01/03/13 0800 01/04/13 0626 01/05/13 0530 01/06/13 0655 01/07/13 0615 01/08/13 0430  NA 140 139 140 139 140 138  K 3.8 3.5 3.4* 3.3* 3.5 3.7  CL 91* 93* 91* 89* 90* 88*  CO2 40* 40* 45* 44* 44* >45*  GLUCOSE 154* 155* 157* 152* 126* 203*  BUN 43* 47* 48* 47* 47* 50*  CREATININE 1.62* 1.86* 2.01* 1.87* 1.69* 1.94*  CALCIUM 10.0 9.7 9.5 10.0 9.7 9.8  MG 2.0  --   --   --  1.8  --   PHOS 3.4  --   --   --   --   --     Liver Function Tests:  Recent Labs Lab 01/03/13 0800  AST 20  ALT 12  ALKPHOS 163*  BILITOT 0.8  PROT 7.1  ALBUMIN 4.2   No results found for this basename: LIPASE, AMYLASE,  in the last 168 hours No results found for this basename: AMMONIA,  in the last 168 hours  CBC:  Recent Labs Lab 01/02/13 1511 01/03/13 0800 01/04/13 0626 01/05/13 0530 01/06/13 0655  WBC 5.6 6.1 6.0 5.6 5.1  NEUTROABS 4.3  --   --   --   --   HGB 10.4* 10.3* 9.5* 9.0* 9.7*  HCT 33.9* 34.0* 31.7* 29.5* 30.8*  MCV 88.3 89.0 89.5 88.3 86.8  PLT 95* 99* 84* 86* 87*    Cardiac Enzymes:  Recent Labs Lab 01/02/13 2354 01/03/13 0800 01/03/13 1135  TROPONINI <0.30 <0.30 <0.30    BNP: BNP (last 3 results)  Recent Labs  01/03/13 0800  PROBNP 4706.0*      Imaging: Dg Chest Port 1 View  01/06/2013  *RADIOLOGY REPORT*  Clinical Data: If placement.  PORTABLE CHEST - 1 VIEW  Comparison: Chest radiograph performed 01/02/2013  Findings: The right PICC is noted ending about the distal SVC.  There is persistent bibasilar and  right midlung zone airspace opacification, with underlying vascular congestion, most compatible with pulmonary edema.  Moderate right and small left pleural effusions are grossly unchanged in appearance.  No pneumothorax is seen.  The cardiomediastinal silhouette is enlarged.  No acute osseous abnormalities are identified.  IMPRESSION:  1.  Right PICC noted ending about the distal SVC. 2.  Relatively stable appearance to pulmonary edema, with moderate right and small left pleural effusions, as on the prior study. 3.  Underlying vascular congestion and cardiomegaly.   Original Report Authenticated By: Tonia Ghent, M.D.      Medications:     Scheduled Medications: . allopurinol  100 mg Oral Daily  . amiodarone  200 mg Oral BID  . busPIRone  5 mg Oral Q12H  . carvedilol  25 mg Oral BID WC  . docusate sodium  100 mg Oral Q8H  . feeding supplement (NEPRO CARB STEADY)  237 mL Oral BID BM  . guaiFENesin  600 mg Oral BID  . insulin aspart  0-5 Units Subcutaneous QHS  . insulin aspart  0-9 Units Subcutaneous TID WC  . insulin glargine  20 Units Subcutaneous QHS  . isosorbide-hydrALAZINE  1 tablet Oral BID  . metolazone  10 mg Oral Daily  . OxyCODONE  10 mg Oral Q12H  . pantoprazole  40 mg Oral Daily  . polyethylene glycol  17 g Oral BID  . spironolactone  25 mg Oral Daily  . traZODone  50 mg Oral QHS  . Warfarin - Pharmacist Dosing Inpatient   Does not apply q1800    Infusions: . furosemide (LASIX) infusion 20 mg/hr (01/08/13 0100)  . milrinone 0.249 mcg/kg/min (01/08/13 0100)    PRN Medications: acetaminophen, albuterol, alum & mag hydroxide-simeth, diphenhydrAMINE, HYDROcodone-acetaminophen, ipratropium, lactulose, ondansetron (ZOFRAN) IV, oxyCODONE, simethicone   Assessment:   1. Acute on chronic right sided heart failure  2. Acute on chronic respiratory failure  3. Acute on chronic renal failure.  4. HTN  5. Obesity, suspect OHS/OSA  6. Chronic atrial flutter  - on coumadin   7. Deconditioning 8. Metabolic alkalosis  Plan/Discussion:    She continues to have massive volume overload with cardiorenal syndrome in the setting of intrinsic renal disease and severe RH failure. This may be an untenable situation. Will check co-ox. We will try to add low dose dopamine as AF rate tolerates. Also add diamox for alkalosis.  May need attempt at short-term CVVHD. Currently UF not an option due to rise in Cr and INR of >3.  AF is chronic on amio for rate control. Has failed DC-CV in past.   Patient and  family updated as to severity of situation  Length of Stay: 6

## 2013-01-08 NOTE — Progress Notes (Signed)
Physical Therapy Treatment Patient Details Name: Gina Ashley MRN: [id number] DOB: 1927/03/29 Today's Date: 01/08/2013 Time: 4098-1191 PT Time Calculation (min): 28 min  PT Assessment / Plan / Recommendation Comments on Treatment Session  Pt admitted with SoB, CHF, anasarca with LLE fx from [facility]. Pt continues to require extensive assist with transfers and mobility and educated to sequence as well as HEp to begin performing to increase strength and ROM. Pt particularly educated for achilles tendon stretch on LLE. Will follow.     Follow Up Recommendations  SNF     Does the patient have the potential to tolerate intense rehabilitation     Barriers to Discharge        Equipment Recommendations       Recommendations for Other Services    Frequency Min 2X/week   Plan Discharge plan remains appropriate;Frequency remains appropriate    Precautions / Restrictions Precautions Precautions: Fall Required Braces or Orthoses: Knee Immobilizer - Left Knee Immobilizer - Left: On when out of bed or walking Restrictions Weight Bearing Restrictions: Yes LLE Weight Bearing: Non weight bearing   Pertinent Vitals/Pain 4/10 LLE pain, repositioned sats 95% on 2L with decrease to 86% on RA when taken off in supine and returned to O2    Mobility  Bed Mobility Bed Mobility: Supine to Sit;Sitting - Scoot to Edge of Bed Supine to Sit: 3: Mod assist;HOB elevated;With rails Sitting - Scoot to Edge of Bed: 4: Min assist Details for Bed Mobility Assistance: pt able to pivot to EOB with use of right rail and assist to initiate bil LE movement with use of pad to fuly scoot to EOB, HOB grossly 20degrees for supine to sit Transfers Transfers: Lateral/Scoot Transfers Lateral/Scoot Transfers: 1: +2 Total assist Lateral Transfers: Patient Percentage: 30% Details for Transfer Assistance: pt scooted from bed to drop arm recliner toward right with use of RLE on floor and cueing for sequence and safety     Exercises General Exercises - Lower Extremity Ankle Circles/Pumps: AROM;PROM;Right;Left;10 reps;Seated (PROM on LLE) Long Arc Quad: AROM;Right;15 reps;Seated Hip ABduction/ADduction: AROM;Both;15 reps;Seated   PT Diagnosis:    PT Problem List:   PT Treatment Interventions:     PT Goals Acute Rehab PT Goals PT Goal: Rolling Supine to Right Side - Progress: Progressing toward goal PT Goal: Supine/Side to Sit - Progress: Progressing toward goal PT Transfer Goal: Bed to Chair/Chair to Bed - Progress: Progressing toward goal  Visit Information  Last PT Received On: 01/08/13 Assistance Needed: +2    Subjective Data  Subjective: I've had that brace on for 6 months now   Cognition  Cognition Overall Cognitive Status: Appears within functional limits for tasks assessed/performed Arousal/Alertness: Awake/alert Orientation Level: Appears intact for tasks assessed Behavior During Session: [facility] for tasks performed    Balance     End of Session PT - End of Session Equipment Utilized During Treatment: Gait belt;Left knee immobilizer Activity Tolerance: Patient tolerated treatment well Patient left: in chair;with call bell/phone within reach;with family/visitor present Nurse Communication: Mobility status;Need for lift equipment   GP     [name] 01/08/2013, 11:10 AM [name], PT [phone]

## 2013-01-08 NOTE — Progress Notes (Signed)
CRITICAL VALUE ALERT  Critical value received:  CO2 - 45  Date of notification:  01/08/13  Time of notification:  5:46 PM  Critical value read back:yes  Nurse who received alert:  Leta Baptist E  MD notified (1st page):  Dr. Gala Romney  Time of first page:  1750  MD notified (2nd page):  Time of second page:  Responding MD:  Dr. Gala Romney  Time MD responded:  [phone]

## 2013-01-08 NOTE — Progress Notes (Signed)
2338Pt c/o left arm pain radiating to chest, 4/10, tightness. Pt also c/o "all over pain" 4/10 and nausea. EKG completed.  MD notified via txt page. 2342: Discussed pt plan of care with MD, this nurse assessment, pain regime, vitals. Orders to be received. Will continue to monitor closely.

## 2013-01-09 DIAGNOSIS — J150 Pneumonia due to Klebsiella pneumoniae: Secondary | ICD-10-CM

## 2013-01-09 LAB — TROPONIN I
Troponin I: <0.3 ng/mL (ref <0.30)
Troponin I: <0.3 ng/mL (ref <0.30)
Troponin I: <0.3 ng/mL (ref <0.30)

## 2013-01-09 LAB — GLUCOSE, CAPILLARY
Glucose-Capillary: 202 mg/dL — ABNORMAL HIGH (ref 70–99)
Glucose-Capillary: 165 mg/dL — ABNORMAL HIGH (ref 70–99)

## 2013-01-09 LAB — BASIC METABOLIC PANEL
CO2: >45 mEq/L (ref 19–32)
Chloride: 86 mEq/L — ABNORMAL LOW (ref 96–112)
Creatinine, Ser: 1.87 mg/dL — ABNORMAL HIGH (ref 0.50–1.10)
Potassium: 3.5 mEq/L (ref 3.5–5.1)

## 2013-01-09 LAB — PROTIME-INR: INR: 3.25 — ABNORMAL HIGH (ref 0.00–1.49)

## 2013-01-09 MED ORDER — METOLAZONE 5 MG PO TABS
5.0000 mg | ORAL_TABLET | Freq: Two times a day (BID) | ORAL | Status: DC
  Administered 2013-01-09 – 2013-01-11 (×4): 5 mg via ORAL
  Filled 2013-01-09 (×6): qty 1

## 2013-01-09 MED ORDER — INSULIN GLARGINE 100 UNIT/ML ~~LOC~~ SOLN
25.0000 [IU] | Freq: Every day | SUBCUTANEOUS | Status: DC
  Administered 2013-01-09 – 2013-01-19 (×11): 25 [IU] via SUBCUTANEOUS
  Filled 2013-01-09 (×2): qty 0.25

## 2013-01-09 NOTE — Progress Notes (Addendum)
Advanced Heart Failure Rounding Note   Subjective:    Gina Ashley is an 79 y.o. female with history of chronic atrial fibrillation on coumadin, diastolic heart failure and HTN. She also has DM type 2, COPD, OSA on CPAP and renal failure with baseline Cr 1.5-1.7. She has had 4 hospitalizations in the last 6 months, 1 included tibia/fibula fracture and the rest have been due to massive fluid overload in the setting of diastolic heart failure. She has required short term dialysis in the past for renal failure.   Echo with RV dysfunction and low urine output therefore started on milrinone 0.25 mcg/kg/minon 3/5  Had poor urine output so low dose dopamine added 3/7. Urine output improved. Weight down 7 pounds. Renal function stable. Bicarb getting worse. Now on diamox.  Had CP overnight. ECG and CE are negative. She says she thinks it is her rotator cuff  AF rate low 100s on dopa.  Objective:   Weight Range:  Vital Signs:   Temp:  [97.3 F (36.3 C)-98.3 F (36.8 C)] 98 F (36.7 C) (03/08 0729) Pulse Rate:  [104-121] 119 (03/08 0729) Resp:  [11-19] 19 (03/08 0729) BP: (109-134)/(66-83) 133/83 mmHg (03/08 0729) SpO2:  [97 %-99 %] 99 % (03/08 0729) Weight:  [114.1 kg (251 lb 8.7 oz)] 114.1 kg (251 lb 8.7 oz) (03/08 0500) Last BM Date: 01/07/13  Weight change: Filed Weights   01/07/13 0500 01/08/13 0500 01/09/13 0500  Weight: 116.3 kg (256 lb 6.3 oz) 117.1 kg (258 lb 2.5 oz) 114.1 kg (251 lb 8.7 oz)    Intake/Output:   Intake/Output Summary (Last 24 hours) at 01/09/13 1020 Last data filed at 01/09/13 0900  Gross per 24 hour  Intake 849.85 ml  Output   4350 ml  Net -3500.15 ml    CVP measured personally = 22 Physical Exam: General: Chronically ill appearing. No resp difficulty on n/c  HEENT: normal  Neck: supple. JVP jaw . Carotids 2+ bilat; no bruits. No lymphadenopathy or thryomegaly appreciated.  Cor: Distant. Regular rate & rhythm. No rubs, gallops or murmurs.  Lungs:  decreased throughout  Abdomen: obese, soft, nontender, ++ distention. Good bowel sounds.  Extremities: no cyanosis, clubbing, rash, 3+ thigh edema. Well healing scar left proximal tibia  Neuro: alert & orientedx3, cranial nerves grossly intact. moves all 4 extremities w/o difficulty. Affect pleasant   Telemetry: atrial fib 90-110  Labs: Basic Metabolic Panel:  Recent Labs Lab 01/03/13 0800  01/06/13 0655 01/07/13 0615 01/08/13 0430 01/08/13 1630 01/09/13 0645  NA 140  < > 139 140 138 139 137  K 3.8  < > 3.3* 3.5 3.7 3.6 3.5  CL 91*  < > 89* 90* 88* 87* 86*  CO2 40*  < > 44* 44* >45* 45* >45*  GLUCOSE 154*  < > 152* 126* 203* 149* 204*  BUN 43*  < > 47* 47* 50* 48* 50*  CREATININE 1.62*  < > 1.87* 1.69* 1.94* 1.82* 1.87*  CALCIUM 10.0  < > 10.0 9.7 9.8 9.8 9.9  MG 2.0  --   --  1.8  --   --   --   PHOS 3.4  --   --   --   --   --   --   < > = values in this interval not displayed.  Liver Function Tests:  Recent Labs Lab 01/03/13 0800  AST 20  ALT 12  ALKPHOS 163*  BILITOT 0.8  PROT 7.1  ALBUMIN 4.2   No  results found for this basename: LIPASE, AMYLASE,  in the last 168 hours No results found for this basename: AMMONIA,  in the last 168 hours  CBC:  Recent Labs Lab 01/02/13 1511 01/03/13 0800 01/04/13 0626 01/05/13 0530 01/06/13 0655  WBC 5.6 6.1 6.0 5.6 5.1  NEUTROABS 4.3  --   --   --   --   HGB 10.4* 10.3* 9.5* 9.0* 9.7*  HCT 33.9* 34.0* 31.7* 29.5* 30.8*  MCV 88.3 89.0 89.5 88.3 86.8  PLT 95* 99* 84* 86* 87*    Cardiac Enzymes:  Recent Labs Lab 01/02/13 2354 01/03/13 0800 01/03/13 1135 01/08/13 2348 01/09/13 0548  TROPONINI <0.30 <0.30 <0.30 <0.30 <0.30    BNP: BNP (last 3 results)  Recent Labs  01/03/13 0800  PROBNP 4706.0*      Imaging: No results found.   Medications:     Scheduled Medications: . acetaZOLAMIDE  500 mg Intravenous BID  . allopurinol  100 mg Oral Daily  . amiodarone  200 mg Oral BID  . busPIRone  5 mg  Oral Q12H  . carvedilol  25 mg Oral BID WC  . docusate sodium  100 mg Oral Q8H  . feeding supplement (NEPRO CARB STEADY)  237 mL Oral BID BM  . guaiFENesin  600 mg Oral BID  . insulin aspart  0-5 Units Subcutaneous QHS  . insulin aspart  0-9 Units Subcutaneous TID WC  . insulin glargine  20 Units Subcutaneous QHS  . isosorbide-hydrALAZINE  1 tablet Oral BID  . metolazone  10 mg Oral Daily  . OxyCODONE  10 mg Oral Q12H  . pantoprazole  40 mg Oral Daily  . polyethylene glycol  17 g Oral BID  . spironolactone  25 mg Oral Daily  . traZODone  50 mg Oral QHS  . Warfarin - Pharmacist Dosing Inpatient   Does not apply q1800    Infusions: . DOPamine 2 mcg/kg/min (01/08/13 1417)  . furosemide (LASIX) infusion 20 mg/hr (01/09/13 0542)  . milrinone 0.25 mcg/kg/min (01/08/13 2354)    PRN Medications: acetaminophen, albuterol, alum & mag hydroxide-simeth, diphenhydrAMINE, HYDROcodone-acetaminophen, ipratropium, lactulose, nitroGLYCERIN, ondansetron (ZOFRAN) IV, oxyCODONE, simethicone   Assessment:   1. Acute on chronic right sided heart failure  2. Acute on chronic respiratory failure  3. Acute on chronic renal failure.  4. HTN  5. Obesity, suspect OHS/OSA  6. Chronic atrial flutter  - on coumadin  7. Deconditioning 8. Metabolic alkalosis  Plan/Discussion:    She continues to have massive volume overload with cardiorenal syndrome in the setting of intrinsic renal disease and severe RH failure. Making some progress with low-dose dopa and milrinone put have a LONG way to go. Will change metolazone to 5 bid. Keep TED hose on. Increase diamox to TID.  The more I get to know her, the more I think dialysis may be best option to keep fluid off, particularly with recurent admissions and cardiorenal syndrome. Co-ox suggests normal cardiac output despite bad right heart so little else we can do from a cardiac standpoint beyond what we are already doing. We will continue with medical therapy for  now and reconsult Renal as needed.  AF is chronic on amio for rate control. Has failed DC-CV in past.   Patient and family updated as to severity of situation  Length of Stay: 7  Daniel Bensimhon,MD 10:31 AM

## 2013-01-09 NOTE — Progress Notes (Signed)
Pt is currently on 3 lpm nasal cannula with 02 sats 100%, resting comfortably, no distress. RN to call RT if patient decides she wants bipap or if she gets into distress. RT will continue to assist as needed.

## 2013-01-09 NOTE — Progress Notes (Signed)
ANTICOAGULATION CONSULT NOTE - Follow Up Consult  Pharmacy Consult for warfarin Indication: atrial fibrillation  Allergies  Allergen Reactions  . Morphine Sulfate Other (See Comments)    REACTION: change in personality  . Remeron (Mirtazapine) Other (See Comments)    Altered mental status, lethargy  . Tuna (Fish Allergy) Other (See Comments)    unknown  . Penicillins Rash    Tolerates Ancef.    Patient Measurements: Height: 5\' 3"  (160 cm) Weight: 251 lb 8.7 oz (114.1 kg) IBW/kg (Calculated) : 52.4   Vital Signs: Temp: 98 F (36.7 C) (03/08 0729) Temp src: Oral (03/08 0729) BP: 133/83 mmHg (03/08 0729) Pulse Rate: 119 (03/08 0729)  Labs:  Recent Labs  01/07/13 0615 01/08/13 0430 01/08/13 1630 01/08/13 2348 01/09/13 0548 01/09/13 0645  LABPROT 32.6* 31.0*  --   --   --  31.4*  INR 3.42* 3.20*  --   --   --  3.25*  CREATININE 1.69* 1.94* 1.82*  --   --  1.87*  TROPONINI  --   --   --  <0.30 <0.30  --     Estimated Creatinine Clearance: 27.7 ml/min (by C-G formula based on Cr of 1.87).   Assessment: 79 y/o F who continues of warfarin for Afib. INR on admit was sub-therapeutic at 1.76. INR today is increased again to 3.25 after holding dose x 2 days. H/H is low and stable, no bleeding reported. Noted DDI with amiodarone, dose increased from home dose. INR continues to rise likely due to combination of extra doses on 3/2 and acute illness with fluid overload/congestion now needing milrinone.  Goal of Therapy:  INR 2-3 Monitor platelets by anticoagulation protocol: Yes   Plan:  - Hold warfarin tonight, will likely need to resume 3/9 - Daily PT/INR, CBC in am - Monitor for signs and symptoms of bleeding   Thank you for the consult.   Tomi Bamberger, PharmD Clinical Pharmacist Pager: [phone] Pharmacy: [phone] 01/09/2013 9:35 AM

## 2013-01-09 NOTE — Progress Notes (Signed)
Urine cultures reviewed; Klebsiella carbapenem producing;   ID following; defer further therapy to primary team.

## 2013-01-09 NOTE — Progress Notes (Addendum)
[facility] PROGRESS NOTE Interim History: The patient is a 79 y.o. year-old with hx of morbid obesity, HTN (all her life), CKD III, afib, OSA on CPAP and DM2 x 10-15 years presenting with marked anasarca to ED. She has been either in hospitals or SNF"s or LTAC's for the last 6 months. She fell and broke her R femur and L lower leg requiring surgical repair during this time. She has gotten temporary dialysis during this time, details not available. Creat is 1.6. She says swelling of abdomen and legs has been there for 7 days. Denies SOB, n/v/d, CP or cough, orthopnea. No fam hx of kidney disease. No NSAID"S. Non ambulatory    Assessment/Plan:  Acute right heart congestive heart failure/ Acute on chronic renal failure, cardio-renal syndrome: -  appriciate HF team assistance. - on lasix drip, dopamine, diamox and  metolazone changed to 5 BID. - Creatinine has stabilize. - CVP 11, C0-ox 77% .- 99 kg back 10.24.2014 down to 114 kg. - further management per HF team.   PAF (paroxysmal atrial fibrillation), was in SR 06/2012 : - will differ to cards - Coumadin per pharmacy  - Continue amiodarone and coreg - INR high.  DM with complications (CKD), controlled : - Hemoglobin A1c on this admission 6.8  - Continue sliding scale insulin  - Increase Lantus Insulin Glargine 25 units Q HS     HYPERTENSION : - cont. Bidil - coreg 25 mg PO BID, spironolactone 25 mg daily  Depression: - Continue buspirone   Obesity (BMI 30-39.9) - counseling.  CKD (chronic kidney disease) - baslein cr 1.7    Anemia due to chronic illness - Hbg stable. - cbc in am   OBSTRUCTIVE SLEEP APNEA - c-pap   HYPERTENSION: - Coreg 25 mg PO BID, spironolactone 25 mg daily - Cont bidil, due to her renal function.    Code Status: partial code blue  Family Communication: no family at bedside  Disposition Plan: home or SNF when stable    Consultants:  HF  Procedures: Echo:ejection fraction was in the  range of 50% to 55%, cannot evaluate diatolic function    Antibiotics:  none  HPI/Subjective: Feels better. No complains  Objective: Filed Vitals:   01/09/13 0329 01/09/13 0500 01/09/13 0729 01/09/13 1144  BP: 134/80  133/83 107/65  Pulse: 120  119 122  Temp:  98.3 F (36.8 C) 98 F (36.7 C) 98.3 F (36.8 C)  TempSrc:  Oral Oral Oral  Resp: 15  19   Height:      Weight:  114.1 kg (251 lb 8.7 oz)    SpO2: 97%  99% 99%    Intake/Output Summary (Last 24 hours) at 01/09/13 1210 Last data filed at 01/09/13 1141  Gross per 24 hour  Intake 1540.18 ml  Output   4350 ml  Net -2809.82 ml   Filed Weights   01/07/13 0500 01/08/13 0500 01/09/13 0500  Weight: 116.3 kg (256 lb 6.3 oz) 117.1 kg (258 lb 2.5 oz) 114.1 kg (251 lb 8.7 oz)    Exam:  General: Alert, awake, oriented x3, in no acute distress.  HEENT: No bruits, no goiter. + JVD Heart: Regular rate and rhythm, without murmurs, rubs, gallops.  Lungs: Good air movement, bilateral air movement.  Abdomen: Soft, nontender,  positive bowel sounds. Anasarca EXT: 3 + edema   Data Reviewed: Basic Metabolic Panel:  Recent Labs Lab 01/03/13 0800  01/06/13 0655 01/07/13 0615 01/08/13 0430 01/08/13 1630 01/09/13 0645  NA 140  < >  139 140 138 139 137  K 3.8  < > 3.3* 3.5 3.7 3.6 3.5  CL 91*  < > 89* 90* 88* 87* 86*  CO2 40*  < > 44* 44* >45* 45* >45*  GLUCOSE 154*  < > 152* 126* 203* 149* 204*  BUN 43*  < > 47* 47* 50* 48* 50*  CREATININE 1.62*  < > 1.87* 1.69* 1.94* 1.82* 1.87*  CALCIUM 10.0  < > 10.0 9.7 9.8 9.8 9.9  MG 2.0  --   --  1.8  --   --   --   PHOS 3.4  --   --   --   --   --   --   < > = values in this interval not displayed. Liver Function Tests:  Recent Labs Lab 01/03/13 0800  AST 20  ALT 12  ALKPHOS 163*  BILITOT 0.8  PROT 7.1  ALBUMIN 4.2   No results found for this basename: LIPASE, AMYLASE,  in the last 168 hours No results found for this basename: AMMONIA,  in the last 168  hours CBC:  Recent Labs Lab 01/02/13 1511 01/03/13 0800 01/04/13 0626 01/05/13 0530 01/06/13 0655  WBC 5.6 6.1 6.0 5.6 5.1  NEUTROABS 4.3  --   --   --   --   HGB 10.4* 10.3* 9.5* 9.0* 9.7*  HCT 33.9* 34.0* 31.7* 29.5* 30.8*  MCV 88.3 89.0 89.5 88.3 86.8  PLT 95* 99* 84* 86* 87*   Cardiac Enzymes:  Recent Labs Lab 01/02/13 2354 01/03/13 0800 01/03/13 1135 01/08/13 2348 01/09/13 0548  TROPONINI <0.30 <0.30 <0.30 <0.30 <0.30   BNP (last 3 results)  Recent Labs  01/03/13 0800  PROBNP 4706.0*   CBG:  Recent Labs Lab 01/08/13 0741 01/08/13 1138 01/08/13 1603 01/08/13 2154 01/09/13 0806  GLUCAP 152* 117* 100* 213* 174*    Recent Results (from the past 240 hour(s))  MRSA PCR SCREENING     Status: None   Collection Time    01/03/13  1:03 AM      Result Value Range Status   MRSA by PCR NEGATIVE  NEGATIVE Final   Comment:            The GeneXpert MRSA Assay (FDA     approved for NASAL specimens     only), is one component of a     comprehensive MRSA colonization     surveillance program. It is not     intended to diagnose MRSA     infection nor to guide or     monitor treatment for     MRSA infections.  URINE CULTURE     Status: None   Collection Time    01/03/13  3:06 AM      Result Value Range Status   Specimen Description URINE, CATHETERIZED   Final   Special Requests ADDED 0337   Final   Culture  Setup Time 01/03/2013 03:54   Final   Colony Count >=100,000 COLONIES/ML   Final   Culture     Final   Value: KLEBSIELLA PNEUMONIAE     Note: THIS ISOLATE HAS BEEN CONFIRMED AS A [facility]PC CARBAPENEMASE PRODUCER COLISTIN 0.125 ETEST results for this drug are "FOR INVESTIGATIONAL USE ONLY" and should NOT be used for clinical purposes.   Report Status PENDING   Incomplete   Organism ID, Bacteria KLEBSIELLA PNEUMONIAE   Final     Studies: No results found.  Scheduled Meds: . acetaZOLAMIDE  500 mg Intravenous  BID  . allopurinol  100 mg Oral Daily  .  amiodarone  200 mg Oral BID  . busPIRone  5 mg Oral Q12H  . carvedilol  25 mg Oral BID WC  . docusate sodium  100 mg Oral Q8H  . feeding supplement (NEPRO CARB STEADY)  237 mL Oral BID BM  . guaiFENesin  600 mg Oral BID  . insulin aspart  0-5 Units Subcutaneous QHS  . insulin aspart  0-9 Units Subcutaneous TID WC  . insulin glargine  20 Units Subcutaneous QHS  . isosorbide-hydrALAZINE  1 tablet Oral BID  . metolazone  10 mg Oral Daily  . OxyCODONE  10 mg Oral Q12H  . pantoprazole  40 mg Oral Daily  . polyethylene glycol  17 g Oral BID  . spironolactone  25 mg Oral Daily  . traZODone  50 mg Oral QHS  . Warfarin - Pharmacist Dosing Inpatient   Does not apply q1800   Continuous Infusions: . DOPamine 2 mcg/kg/min (01/08/13 1417)  . furosemide (LASIX) infusion 20 mg/hr (01/09/13 0542)  . milrinone 0.25 mcg/kg/min (01/08/13 2354)     Marinda Elk  [facility] Pager [phone]. If 8PM-8AM, please contact night-coverage at [email or web address], password [facility] 01/09/2013, 12:10 PM  LOS: 7 days

## 2013-01-09 NOTE — Consult Note (Signed)
INFECTIOUS DISEASE CONSULT NOTE  Date of Admission:  01/02/2013  Date of Consult:  01/09/2013  Reason for Consult:KPC (Klebsiella producing carbapenem) Referring Physician: CHAMP  Impression/Recommendation KPC, asymptomatic bacturia CHF CKD   Would- hold anbx Not treat KPC unless she has change in fever, wbc, clinical status.  Strict Isolation.  Thank you so much for this interesting consult,   Johny Sax [phone]  Gina Ashley is an 79 y.o. female.  HPI: 79 yo F with hx of DM, HTN, Afib, diastolic CHF, obesity, CKD III, comes to hospital on 3-1 with worsening LE edema. She was previously staying at [facility] after LE fractures. She has previously had HD (short term) and had her cath removed on ~2-22. She was brought to [facility] on 3-1 after having worsening SOB, abd distension, and LE edema. Her Cr on admisson was 1.65 and her CXR showed interstitial edema and effusions. proBNP 4706.  She was treated with diuresis in hospital and then milrinone. Diamox added as well. Wt down 3 kg in hospital.  She had UCx sent 3-2 (afeb, normal WBC) which has since grown Klebsiella (carbapenemase producing). She is noted to have had a foley in place since 12-01-12.   Past Medical History  Diagnosis Date  . GERD (gastroesophageal reflux disease)   . Back pain, chronic   . Pain in shoulder   . Pain in ankle   . Hiatal hernia   . HTN (hypertension)     all her life  . CKD (chronic kidney disease), stage III     GFR: 38  . Atrial fibrillation     since 1996  . Sleep apnea   . Fall 3 weeks ago    was evaluated at [facility]  . OSA on CPAP   . Depression   . PONV (postoperative nausea and vomiting)     difficult to wake up  . Chronic diastolic heart failure 08/31/2012  . Tibia fracture     BILATERAL  . Renal failure     acute on chronic   . Fibula fracture     bilateral  . Respiratory failure     acute on chronic hx of  requiring BIPAP  . COPD (chronic obstructive pulmonary disease)     . Metabolic encephalopathy     hx of   . Renal failure     acute on chronic with need for intermittent dialysis - hx of   . UTI (urinary tract infection)     hx of   . Pleural effusion     right sided now resolved   . Diabetes mellitus     x 15 yrs, hx of hypoglycemia   . CHF (congestive heart failure)     diastolic HF    Past Surgical History  Procedure Laterality Date  . Bunionectomy      bilateral  . Cholecystectomy    . Rotator cuff repair      2002  . Total knee arthroplasty      bilateral 2001  . Hammer toe surgery      2004  . Colonoscopy  07/11/2011    Procedure: COLONOSCOPY;  Surgeon: Malissa Hippo, MD;  Location: [facility];  Service: Endoscopy;  Laterality: N/A;  3:00  . Orif periprosthetic fracture  09/08/2012    Procedure: OPEN REDUCTION INTERNAL FIXATION (ORIF) PERIPROSTHETIC FRACTURE;  Surgeon: Shelda Pal, MD;  Location: [facility]S;  Service: Orthopedics;  Laterality: Right;  ORIF periprosthetic right proximal femur fracture Spanning external fixator left  distal tibia  . External fixation leg  09/08/2012    Procedure: EXTERNAL FIXATION LEG;  Surgeon: Shelda Pal, MD;  Location: [facility];  Service: Orthopedics;  Laterality: Left;  . Joint replacement      bilateral knees   . External fixation leg  11/30/2012    Procedure: EXTERNAL FIXATION LEG;  Surgeon: Shelda Pal, MD;  Location: [facility];  Service: Orthopedics;  Laterality: Left;  Removal of External Fixation Left Knee with Evaluation with Floroscopy     Allergies  Allergen Reactions  . Morphine Sulfate Other (See Comments)    REACTION: change in personality  . Remeron (Mirtazapine) Other (See Comments)    Altered mental status, lethargy  . Tuna (Fish Allergy) Other (See Comments)    unknown  . Penicillins Rash    Tolerates Ancef.    Medications:  Scheduled: . acetaZOLAMIDE  500 mg Intravenous BID  . allopurinol  100 mg Oral Daily  . amiodarone  200 mg Oral BID  . busPIRone  5 mg Oral Q12H   . carvedilol  25 mg Oral BID WC  . docusate sodium  100 mg Oral Q8H  . feeding supplement (NEPRO CARB STEADY)  237 mL Oral BID BM  . guaiFENesin  600 mg Oral BID  . insulin aspart  0-5 Units Subcutaneous QHS  . insulin aspart  0-9 Units Subcutaneous TID WC  . insulin glargine  25 Units Subcutaneous QHS  . isosorbide-hydrALAZINE  1 tablet Oral BID  . metolazone  10 mg Oral Daily  . OxyCODONE  10 mg Oral Q12H  . pantoprazole  40 mg Oral Daily  . polyethylene glycol  17 g Oral BID  . spironolactone  25 mg Oral Daily  . traZODone  50 mg Oral QHS  . Warfarin - Pharmacist Dosing Inpatient   Does not apply q1800    Total days of antibiotics:0   Social History:  reports that she has never smoked. She has never used smokeless tobacco. She reports that she does not drink alcohol or use illicit drugs.  Family History  Problem Relation Age of Onset  . Colon cancer Brother     General ROS: see HPI, chronic back pain, has received multiple courses of anbx previously,  nl BM qod.   Blood pressure 107/65, pulse 122, temperature 98.3 F (36.8 C), temperature source Oral, resp. rate 19, height 5\' 3"  (1.6 m), weight 114.1 kg (251 lb 8.7 oz), SpO2 99.00%. General appearance: alert, cooperative and no distress Eyes: negative findings: pupils equal, round, reactive to light and accomodation Throat: normal findings: oropharynx pink & moist without lesions or evidence of thrush Neck: no adenopathy and supple, symmetrical, trachea midline Lungs: rales anterior - bilateral Heart: irregularly irregular rhythm Abdomen: normal findings: bowel sounds normal and soft, non-tender and abnormal findings:  distended Extremities: edema 3+ BLE   Results for orders placed during the hospital encounter of 01/02/13 (from the past 48 hour(s))  GLUCOSE, CAPILLARY     Status: Abnormal   Collection Time    01/07/13  4:52 PM      Result Value Range   Glucose-Capillary 227 (*) 70 - 99 mg/dL   Comment 1 Notify RN     GLUCOSE, CAPILLARY     Status: Abnormal   Collection Time    01/07/13  9:23 PM      Result Value Range   Glucose-Capillary 282 (*) 70 - 99 mg/dL  PROTIME-INR     Status: Abnormal   Collection Time  01/08/13  4:30 AM      Result Value Range   Prothrombin Time 31.0 (*) 11.6 - 15.2 seconds   INR 3.20 (*) 0.00 - 1.49  BASIC METABOLIC PANEL     Status: Abnormal   Collection Time    01/08/13  4:30 AM      Result Value Range   Sodium 138  135 - 145 mEq/L   Potassium 3.7  3.5 - 5.1 mEq/L   Chloride 88 (*) 96 - 112 mEq/L   CO2 >45 (*) 19 - 32 mEq/L   Comment: CRITICAL RESULT CALLED TO, READ BACK BY AND VERIFIED WITH:     JONES C,RN 01/08/13 0525 WAYK   Glucose, Bld 203 (*) 70 - 99 mg/dL   BUN 50 (*) 6 - 23 mg/dL   Creatinine, Ser 1.61 (*) 0.50 - 1.10 mg/dL   Calcium 9.8  8.4 - 09.6 mg/dL   GFR calc non Af Amer 23 (*) >90 mL/min   GFR calc Af Amer 26 (*) >90 mL/min   Comment:            The eGFR has been calculated     using the CKD EPI equation.     This calculation has not been     validated in all clinical     situations.     eGFR's persistently     <90 mL/min signify     possible Chronic Kidney Disease.  GLUCOSE, CAPILLARY     Status: Abnormal   Collection Time    01/08/13  7:41 AM      Result Value Range   Glucose-Capillary 152 (*) 70 - 99 mg/dL  GLUCOSE, CAPILLARY     Status: Abnormal   Collection Time    01/08/13 11:38 AM      Result Value Range   Glucose-Capillary 117 (*) 70 - 99 mg/dL  CARBOXYHEMOGLOBIN     Status: Abnormal   Collection Time    01/08/13 12:20 PM      Result Value Range   Total hemoglobin 9.0 (*) 12.0 - 16.0 g/dL   O2 Saturation 04.5     Carboxyhemoglobin 2.1 (*) 0.5 - 1.5 %   Methemoglobin 1.1  0.0 - 1.5 %  GLUCOSE, CAPILLARY     Status: Abnormal   Collection Time    01/08/13  4:03 PM      Result Value Range   Glucose-Capillary 100 (*) 70 - 99 mg/dL  BASIC METABOLIC PANEL     Status: Abnormal   Collection Time    01/08/13  4:30 PM       Result Value Range   Sodium 139  135 - 145 mEq/L   Potassium 3.6  3.5 - 5.1 mEq/L   Chloride 87 (*) 96 - 112 mEq/L   CO2 45 (*) 19 - 32 mEq/L   Comment: CRITICAL RESULT CALLED TO, READ BACK BY AND VERIFIED WITH:     J CORONA,RN 1745 01/08/13 D BRADLEY   Glucose, Bld 149 (*) 70 - 99 mg/dL   BUN 48 (*) 6 - 23 mg/dL   Creatinine, Ser 4.09 (*) 0.50 - 1.10 mg/dL   Calcium 9.8  8.4 - 81.1 mg/dL   GFR calc non Af Amer 25 (*) >90 mL/min   GFR calc Af Amer 28 (*) >90 mL/min   Comment:            The eGFR has been calculated     using the CKD EPI equation.     This calculation  has not been     validated in all clinical     situations.     eGFR's persistently     <90 mL/min signify     possible Chronic Kidney Disease.  GLUCOSE, CAPILLARY     Status: Abnormal   Collection Time    01/08/13  9:54 PM      Result Value Range   Glucose-Capillary 213 (*) 70 - 99 mg/dL  TROPONIN I     Status: None   Collection Time    01/08/13 11:48 PM      Result Value Range   Troponin I <0.30  <0.30 ng/mL   Comment:            Due to the release kinetics of cTnI,     a negative result within the first hours     of the onset of symptoms does not rule out     myocardial infarction with certainty.     If myocardial infarction is still suspected,     repeat the test at appropriate intervals.  TROPONIN I     Status: None   Collection Time    01/09/13  5:48 AM      Result Value Range   Troponin I <0.30  <0.30 ng/mL   Comment:            Due to the release kinetics of cTnI,     a negative result within the first hours     of the onset of symptoms does not rule out     myocardial infarction with certainty.     If myocardial infarction is still suspected,     repeat the test at appropriate intervals.  PROTIME-INR     Status: Abnormal   Collection Time    01/09/13  6:45 AM      Result Value Range   Prothrombin Time 31.4 (*) 11.6 - 15.2 seconds   INR 3.25 (*) 0.00 - 1.49  BASIC METABOLIC PANEL     Status:  Abnormal   Collection Time    01/09/13  6:45 AM      Result Value Range   Sodium 137  135 - 145 mEq/L   Potassium 3.5  3.5 - 5.1 mEq/L   Chloride 86 (*) 96 - 112 mEq/L   CO2 >45 (*) 19 - 32 mEq/L   Comment: CRITICAL RESULT CALLED TO, READ BACK BY AND VERIFIED WITH:     MUHORODRN [id number] MCCAULEG   Glucose, Bld 204 (*) 70 - 99 mg/dL   BUN 50 (*) 6 - 23 mg/dL   Creatinine, Ser 0.98 (*) 0.50 - 1.10 mg/dL   Calcium 9.9  8.4 - 11.9 mg/dL   GFR calc non Af Amer 24 (*) >90 mL/min   GFR calc Af Amer 28 (*) >90 mL/min   Comment:            The eGFR has been calculated     using the CKD EPI equation.     This calculation has not been     validated in all clinical     situations.     eGFR's persistently     <90 mL/min signify     possible Chronic Kidney Disease.  GLUCOSE, CAPILLARY     Status: Abnormal   Collection Time    01/09/13  8:06 AM      Result Value Range   Glucose-Capillary 174 (*) 70 - 99 mg/dL  GLUCOSE, CAPILLARY     Status: Abnormal  Collection Time    01/09/13 12:27 PM      Result Value Range   Glucose-Capillary 199 (*) 70 - 99 mg/dL      Component Value Date/Time   SDES URINE, CATHETERIZED 01/03/2013 0306   SPECREQUEST ADDED 0337 01/03/2013 0306   CULT  Value: KLEBSIELLA PNEUMONIAE Note: THIS ISOLATE HAS BEEN CONFIRMED AS A [facility]PC CARBAPENEMASE PRODUCER COLISTIN 0.125 ETEST results for this drug are "FOR INVESTIGATIONAL USE ONLY" and should NOT be used for clinical purposes. 01/03/2013 0306   REPTSTATUS PENDING 01/03/2013 0306   No results found. Recent Results (from the past 240 hour(s))  MRSA PCR SCREENING     Status: None   Collection Time    01/03/13  1:03 AM      Result Value Range Status   MRSA by PCR NEGATIVE  NEGATIVE Final   Comment:            The GeneXpert MRSA Assay (FDA     approved for NASAL specimens     only), is one component of a     comprehensive MRSA colonization     surveillance program. It is not     intended to diagnose MRSA     infection  nor to guide or     monitor treatment for     MRSA infections.  URINE CULTURE     Status: None   Collection Time    01/03/13  3:06 AM      Result Value Range Status   Specimen Description URINE, CATHETERIZED   Final   Special Requests ADDED 0337   Final   Culture  Setup Time 01/03/2013 03:54   Final   Colony Count >=100,000 COLONIES/ML   Final   Culture     Final   Value: KLEBSIELLA PNEUMONIAE     Note: THIS ISOLATE HAS BEEN CONFIRMED AS A KPC CARBAPENEMASE PRODUCER COLISTIN 0.125 ETEST results for this drug are "FOR INVESTIGATIONAL USE ONLY" and should NOT be used for clinical purposes.   Report Status PENDING   Incomplete   Organism ID, Bacteria KLEBSIELLA PNEUMONIAE   Final      01/09/2013, 3:24 PM     LOS: 7 days

## 2013-01-09 NOTE — Care Management (Signed)
Pt wore bipap a total of 30 min tonight before she removed it. Will monotor.

## 2013-01-10 LAB — GLUCOSE, CAPILLARY
Glucose-Capillary: 120 mg/dL — ABNORMAL HIGH (ref 70–99)
Glucose-Capillary: 218 mg/dL — ABNORMAL HIGH (ref 70–99)

## 2013-01-10 LAB — BASIC METABOLIC PANEL
BUN: 52 mg/dL — ABNORMAL HIGH (ref 6–23)
GFR calc Af Amer: 24 mL/min — ABNORMAL LOW (ref >90)
GFR calc non Af Amer: 21 mL/min — ABNORMAL LOW (ref >90)
Potassium: 3.4 mEq/L — ABNORMAL LOW (ref 3.5–5.1)
Sodium: 134 mEq/L — ABNORMAL LOW (ref 135–145)

## 2013-01-10 LAB — CBC
Hemoglobin: 9 g/dL — ABNORMAL LOW (ref 12.0–15.0)
MCHC: 31.6 g/dL (ref 30.0–36.0)

## 2013-01-10 LAB — PROTIME-INR
INR: 3.34 — ABNORMAL HIGH (ref 0.00–1.49)
Prothrombin Time: 32 seconds — ABNORMAL HIGH (ref 11.6–15.2)

## 2013-01-10 MED ORDER — POTASSIUM CHLORIDE CRYS ER 20 MEQ PO TBCR
40.0000 meq | EXTENDED_RELEASE_TABLET | Freq: Once | ORAL | Status: AC
  Administered 2013-01-10: 40 meq via ORAL

## 2013-01-10 MED ORDER — POTASSIUM CHLORIDE CRYS ER 20 MEQ PO TBCR
40.0000 meq | EXTENDED_RELEASE_TABLET | Freq: Every day | ORAL | Status: DC
  Administered 2013-01-10: 40 meq via ORAL
  Filled 2013-01-10: qty 2

## 2013-01-10 MED ORDER — CARVEDILOL 12.5 MG PO TABS
12.5000 mg | ORAL_TABLET | Freq: Two times a day (BID) | ORAL | Status: DC
  Administered 2013-01-10 – 2013-01-23 (×24): 12.5 mg via ORAL
  Filled 2013-01-10 (×28): qty 1

## 2013-01-10 NOTE — Progress Notes (Addendum)
Advanced Heart Failure Rounding Note   Subjective:    Gina Ashley is an 79 y.o. female with history of chronic atrial fibrillation on coumadin, diastolic heart failure and HTN. She also has DM type 2, COPD, OSA on CPAP and renal failure with baseline Cr 1.5-1.7. She has had 4 hospitalizations in the last 6 months, 1 included tibia/fibula fracture and the rest have been due to massive fluid overload in the setting of diastolic heart failure. She has required short term dialysis in the past for renal failure.   Echo with RV dysfunction and low urine output therefore started on milrinone 0.25 mcg/kg/minon 3/5. Had poor urine output so low dose dopamine added 3/7. Urine output improved.  Bicarb getting worse. Now on diamox.  Continues to diurese well. Breathing much better. Says her stomach is much improved.   CVP checked personally 20-22.   AF rate low 100s on dopa.  Objective:   Weight Range:  Vital Signs:   Temp:  [98 F (36.7 C)-98.3 F (36.8 C)] 98.3 F (36.8 C) (03/09 1130) Pulse Rate:  [89-121] 121 (03/09 1130) Resp:  [10-17] 11 (03/09 1130) BP: (92-129)/(49-80) 92/49 mmHg (03/09 1130) SpO2:  [95 %-100 %] 95 % (03/09 1130) Weight:  [113.7 kg (250 lb 10.6 oz)] 113.7 kg (250 lb 10.6 oz) (03/09 0535) Last BM Date: 01/07/13  Weight change: Filed Weights   01/08/13 0500 01/09/13 0500 01/10/13 0535  Weight: 117.1 kg (258 lb 2.5 oz) 114.1 kg (251 lb 8.7 oz) 113.7 kg (250 lb 10.6 oz)    Intake/Output:   Intake/Output Summary (Last 24 hours) at 01/10/13 1218 Last data filed at 01/10/13 1000  Gross per 24 hour  Intake  972.6 ml  Output   4350 ml  Net -3377.4 ml    CVP measured personally = 22 Physical Exam: General: Chronically ill appearing. No resp difficulty on n/c  HEENT: normal  Neck: supple. JVP jaw . Carotids 2+ bilat; no bruits. No lymphadenopathy or thryomegaly appreciated.  Cor: Distant. Regular rate & rhythm. No rubs, gallops or murmurs.  Lungs: decreased  throughout  Abdomen: obese, soft, nontender, distention much improved. Good bowel sounds.  Extremities: no cyanosis, clubbing, rash, 1+ edema. + TED hoseWell healing scar left proximal tibia  Neuro: alert & orientedx3, cranial nerves grossly intact. moves all 4 extremities w/o difficulty. Affect pleasant   Telemetry: atrial fib 90-110  Labs: Basic Metabolic Panel:  Recent Labs Lab 01/07/13 0615 01/08/13 0430 01/08/13 1630 01/09/13 0645 01/10/13 0500  NA 140 138 139 137 134*  K 3.5 3.7 3.6 3.5 3.4*  CL 90* 88* 87* 86* 85*  CO2 44* >45* 45* >45* >45*  GLUCOSE 126* 203* 149* 204* 145*  BUN 47* 50* 48* 50* 52*  CREATININE 1.69* 1.94* 1.82* 1.87* 2.08*  CALCIUM 9.7 9.8 9.8 9.9 9.8  MG 1.8  --   --   --   --     Liver Function Tests: No results found for this basename: AST, ALT, ALKPHOS, BILITOT, PROT, ALBUMIN,  in the last 168 hours No results found for this basename: LIPASE, AMYLASE,  in the last 168 hours No results found for this basename: AMMONIA,  in the last 168 hours  CBC:  Recent Labs Lab 01/04/13 0626 01/05/13 0530 01/06/13 0655 01/10/13 0500  WBC 6.0 5.6 5.1 6.2  HGB 9.5* 9.0* 9.7* 9.0*  HCT 31.7* 29.5* 30.8* 28.5*  MCV 89.5 88.3 86.8 87.2  PLT 84* 86* 87* 90*    Cardiac Enzymes:  Recent  Labs Lab 01/08/13 2348 01/09/13 0548 01/09/13 1510  TROPONINI <0.30 <0.30 <0.30    BNP: BNP (last 3 results)  Recent Labs  01/03/13 0800  PROBNP 4706.0*      Imaging: No results found.   Medications:     Scheduled Medications: . allopurinol  100 mg Oral Daily  . amiodarone  200 mg Oral BID  . busPIRone  5 mg Oral Q12H  . carvedilol  25 mg Oral BID WC  . docusate sodium  100 mg Oral Q8H  . feeding supplement (NEPRO CARB STEADY)  237 mL Oral BID BM  . guaiFENesin  600 mg Oral BID  . insulin aspart  0-5 Units Subcutaneous QHS  . insulin aspart  0-9 Units Subcutaneous TID WC  . insulin glargine  25 Units Subcutaneous QHS  .  isosorbide-hydrALAZINE  1 tablet Oral BID  . metolazone  5 mg Oral BID  . OxyCODONE  10 mg Oral Q12H  . pantoprazole  40 mg Oral Daily  . polyethylene glycol  17 g Oral BID  . spironolactone  25 mg Oral Daily  . traZODone  50 mg Oral QHS  . Warfarin - Pharmacist Dosing Inpatient   Does not apply q1800    Infusions: . DOPamine 2 mcg/kg/min (01/08/13 1417)  . furosemide (LASIX) infusion 20 mg/hr (01/09/13 1845)  . milrinone 0.25 mcg/kg/min (01/10/13 0314)    PRN Medications: acetaminophen, albuterol, alum & mag hydroxide-simeth, diphenhydrAMINE, HYDROcodone-acetaminophen, ipratropium, lactulose, nitroGLYCERIN, ondansetron (ZOFRAN) IV, oxyCODONE, simethicone   Assessment:   1. Acute on chronic right sided heart failure  2. Acute on chronic respiratory failure  3. Acute on chronic renal failure.  4. HTN  5. Obesity, suspect OHS/OSA  6. Chronic atrial flutter  - on coumadin  7. Deconditioning 8. Metabolic alkalosis 9. Hypokalemia  Plan/Discussion:    Volume overload improving but creatinine getting worse again. Consistent with cardiorenal syndrome in the setting of intrinsic renal disease and severe RH failure. Will continue low-dose dopa and milrinone and metolazone 5 bid. Cut lasix gtt to 10.   Supp K+ and cut carvedilol back to 12.5 bid to let BP rise to assist with renal perfusion.  The more I get to know her, the more I think dialysis may be best option to keep fluid off, particularly with recurrent admissions and cardiorenal syndrome. Co-ox suggests normal cardiac output despite bad right heart so little else we can do from a cardiac standpoint beyond what we are already doing. We will continue with medical therapy for now and reconsult Renal as needed.  AF is chronic on amio for rate control. Has failed DC-CV in past.   Patient and family updated as to severity of situation  Length of Stay: 8  Daniel Bensimhon,MD 12:18 PM

## 2013-01-10 NOTE — Progress Notes (Addendum)
[facility]                                                                                Patient Demographics  Gina Ashley, is a 79 y.o. female, DOB - [birth year], GEX:[id number], WNU:[id number]  Admit date - 01/02/2013  Admitting Physician Therisa Doyne, MD  Outpatient Primary MD for the patient is Ernestine Conrad, MD  LOS - 8   Chief Complaint  Patient presents with  . Vascular Access Problem        Assessment & Plan   Interim History:  The patient is a 79 y.o. year-old with hx of morbid obesity, HTN (all her life), CKD III, afib, OSA on CPAP and DM2 x 10-15 years presenting with marked anasarca to ED. She has been either in hospitals or SNF"s or LTAC's for the last 6 months. She fell and broke her R femur and L lower leg requiring surgical repair during this time. She has gotten temporary dialysis during this time, details not available. Creat is 1.6. She says swelling of abdomen and legs has been there for 7 days. Denies SOB, n/v/d, CP or cough, orthopnea. No fam hx of kidney disease.  y.   Assessment/Plan:   Acute right heart congestive heart failure/ Acute on chronic renal failure, cardio-renal syndrome:  - appriciate HF team assistance.  - on lasix drip, dopamine, diamox and metolazone changed per HF team. - CVP 11, C0-ox 77%  .- 99 kg back 10.24.2014 down to 114 kg.  - further management per HF team.     PAF (paroxysmal atrial fibrillation), was in [facility] 06/2012 :  - will differ to cards  - Continue amiodarone and coreg  - INR high, pharmacy monitoring Coumadin dose.    DM with complications (CKD), controlled :  - Hemoglobin A1c on this admission 6.8  - Continue sliding scale insulin  - Increase Lantus Insulin Glargine 25 units Q HS   CBG (last 3)   Recent Labs  01/09/13 1655 01/09/13 2233 01/10/13 0832  GLUCAP 202* 165* 120*       Depression:  - Continue buspirone     Obesity (BMI 30-39.9)  - counseling.    CKD (chronic  kidney disease)  - baslein cr 1.7 to 1.8, slightly up than baseline, Lasix dose has been reduced by cardiologist today, we'll continue to trend.    Anemia due to chronic illness  - Hbg stable.  Check H&H intermittently     OBSTRUCTIVE SLEEP APNEA   - c-pap     HYPERTENSION:  - Blood pressure slightly on the lower side, Coreg has been reduced by cardiology, also Lasix dose has been reduced, Cont bidil , no dizziness, patient asymptomatic monitor blood pressure and adjust medications as needed.    Bilateral lower extremity fracture due to recent fall at rehabilitation.  PT OT as tolerated, rehabilitation placement.    Hypokalemia.   Replaced today, recheck in the morning with magnesium. Patient on Aldactone also.    Code Status: Partial  Family Communication: Patient and daughter bedside  Disposition Plan: Rehabilitation   Procedures Ech0 - Ef 50-55 %,  Cannot evaluate diastolic dysfunction   Consults  Heart Failure team  DVT Prophylaxis  Coumadin  Lab Results  Component Value Date   INR 3.34* 01/10/2013   INR 3.25* 01/09/2013   INR 3.20* 01/08/2013     Lab Results  Component Value Date   PLT 90* 01/10/2013    Medications  Scheduled Meds: . allopurinol  100 mg Oral Daily  . amiodarone  200 mg Oral BID  . busPIRone  5 mg Oral Q12H  . carvedilol  12.5 mg Oral BID WC  . docusate sodium  100 mg Oral Q8H  . feeding supplement (NEPRO CARB STEADY)  237 mL Oral BID BM  . guaiFENesin  600 mg Oral BID  . insulin aspart  0-5 Units Subcutaneous QHS  . insulin aspart  0-9 Units Subcutaneous TID WC  . insulin glargine  25 Units Subcutaneous QHS  . isosorbide-hydrALAZINE  1 tablet Oral BID  . metolazone  5 mg Oral BID  . OxyCODONE  10 mg Oral Q12H  . pantoprazole  40 mg Oral Daily  . polyethylene glycol  17 g Oral BID  . potassium chloride  40 mEq Oral Daily  . spironolactone  25 mg Oral Daily  . traZODone  50 mg Oral QHS  . Warfarin - Pharmacist Dosing  Inpatient   Does not apply q1800   Continuous Infusions: . DOPamine 2 mcg/kg/min (01/08/13 1417)  . furosemide (LASIX) infusion 10 mg/hr (01/10/13 1256)  . milrinone 0.25 mcg/kg/min (01/10/13 0314)   PRN Meds:.acetaminophen, albuterol, alum & mag hydroxide-simeth, diphenhydrAMINE, HYDROcodone-acetaminophen, ipratropium, lactulose, nitroGLYCERIN, ondansetron (ZOFRAN) IV, oxyCODONE, simethicone  Antibiotics    Anti-infectives   None       Time Spent in minutes   35   SINGH,PRASHANT K M.D on 01/10/2013 at 1:11 PM  Between 7am to 7pm - Pager - [phone]  After 7pm go to [email or web address] - password [email or web address]  And look for the night coverage person covering for me after hours  [facility] Office  [phone]    Subjective:   Gina Ashley today has, No headache, No chest pain, No abdominal pain - No Nausea, No new weakness tingling or numbness, No Cough - SOB.    Objective:   Filed Vitals:   01/10/13 0535 01/10/13 0743 01/10/13 1130 01/10/13 1200  BP:  111/61 92/49   Pulse:  113 121   Temp:  98 F (36.7 C) 98.3 F (36.8 C)   TempSrc:  Axillary Oral   Resp:  12 11   Height:      Weight: 113.7 kg (250 lb 10.6 oz)   111.6 kg (246 lb 0.5 oz)  SpO2:  100% 95%     Wt Readings from Last 3 Encounters:  01/10/13 111.6 kg (246 lb 0.5 oz)  11/30/12 98.884 kg (218 lb)  11/30/12 98.884 kg (218 lb)     Intake/Output Summary (Last 24 hours) at 01/10/13 1311 Last data filed at 01/10/13 1000  Gross per 24 hour  Intake  939.3 ml  Output   4350 ml  Net -3410.7 ml    Exam Awake Alert, Oriented X 3, No new F.N deficits, Normal affect [place].AT,PERRAL Supple Neck,No JVD, No cervical lymphadenopathy appriciated.  Symmetrical Chest wall movement, Good air movement bilaterally, CTAB RRR,No Gallops,Rubs or new Murmurs, No Parasternal Heave +ve B.Sounds, Abd Soft, Non tender, No organomegaly appriciated, No rebound - guarding or rigidity. No Cyanosis, Clubbing , 1+ edema,  No new Rash or bruise    Data Review   Micro Results Recent Results (from the past 240 hour(s))  MRSA PCR  SCREENING     Status: None   Collection Time    01/03/13  1:03 AM      Result Value Range Status   MRSA by PCR NEGATIVE  NEGATIVE Final   Comment:            The GeneXpert MRSA Assay (FDA     approved for NASAL specimens     only), is one component of a     comprehensive MRSA colonization     surveillance program. It is not     intended to diagnose MRSA     infection nor to guide or     monitor treatment for     MRSA infections.  URINE CULTURE     Status: None   Collection Time    01/03/13  3:06 AM      Result Value Range Status   Specimen Description URINE, CATHETERIZED   Final   Special Requests ADDED 0337   Final   Culture  Setup Time 01/03/2013 03:54   Final   Colony Count >=100,000 COLONIES/ML   Final   Culture     Final   Value: KLEBSIELLA PNEUMONIAE     Note: THIS ISOLATE HAS BEEN CONFIRMED AS A [facility] CARBAPENEMASE PRODUCER COLISTIN 0.125 ETEST results for this drug are "FOR INVESTIGATIONAL USE ONLY" and should NOT be used for clinical purposes. CRITICAL RESULT CALLED TO, READ BACK BY AND VERIFIED WITH:      LAURA S (INFECTION CONTROL) ON 3.8.14 @ 1535 BY FERGK   Report Status 01/09/2013 FINAL   Final   Organism ID, Bacteria KLEBSIELLA PNEUMONIAE   Final    Radiology Reports Dg Chest 2 View  01/02/2013  *RADIOLOGY REPORT*  Clinical Data: Shortness of breath  CHEST - 2 VIEW  Comparison: 11/24/2012  Findings: Cardiomegaly with mild interstitial edema, stable. Moderate right pleural effusion, increased.  Small left pleural effusion, unchanged.  No pneumothorax.  Interval removal of right PICC and left chest dual lumen catheter.  Degenerative changes of the visualized thoracolumbar spine.  IMPRESSION: Cardiomegaly with mild interstitial edema and small left pleural effusion, unchanged.  Moderate right pleural effusion, increased.   Original Report Authenticated By: Charline Bills, M.D.    Dg Chest Port 1 View  01/06/2013  *RADIOLOGY REPORT*  Clinical Data: If placement.  PORTABLE CHEST - 1 VIEW  Comparison: Chest radiograph performed 01/02/2013  Findings: The right PICC is noted ending about the distal SVC.  There is persistent bibasilar and right midlung zone airspace opacification, with underlying vascular congestion, most compatible with pulmonary edema.  Moderate right and small left pleural effusions are grossly unchanged in appearance.  No pneumothorax is seen.  The cardiomediastinal silhouette is enlarged.  No acute osseous abnormalities are identified.  IMPRESSION:  1.  Right PICC noted ending about the distal SVC. 2.  Relatively stable appearance to pulmonary edema, with moderate right and small left pleural effusions, as on the prior study. 3.  Underlying vascular congestion and cardiomegaly.   Original Report Authenticated By: Tonia Ghent, M.D.     [facility]  Recent Labs Lab 01/04/13 [phone] 01/05/13 0530 01/06/13 0655 01/10/13 0500  WBC 6.0 5.6 5.1 6.2  HGB 9.5* 9.0* 9.7* 9.0*  HCT 31.7* 29.5* 30.8* 28.5*  PLT 84* 86* 87* 90*  MCV 89.5 88.3 86.8 87.2  MCH 26.8 26.9 27.3 27.5  MCHC 30.0 30.5 31.5 31.6  RDW 16.8* 16.7* 16.5* 16.3*    Chemistries   Recent Labs Lab 01/07/13 0615 01/08/13 0430 01/08/13 1630  01/09/13 0645 01/10/13 0500  NA 140 138 139 137 134*  K 3.5 3.7 3.6 3.5 3.4*  CL 90* 88* 87* 86* 85*  CO2 44* >45* 45* >45* >45*  GLUCOSE 126* 203* 149* 204* 145*  BUN 47* 50* 48* 50* 52*  CREATININE 1.69* 1.94* 1.82* 1.87* 2.08*  CALCIUM 9.7 9.8 9.8 9.9 9.8  MG 1.8  --   --   --   --    ------------------------------------------------------------------------------------------------------------------ estimated creatinine clearance is 24.6 ml/min (by C-G formula based on Cr of 2.08). ------------------------------------------------------------------------------------------------------------------ No results found for this basename:  HGBA1C,  in the last 72 hours ------------------------------------------------------------------------------------------------------------------ No results found for this basename: CHOL, HDL, LDLCALC, TRIG, CHOLHDL, LDLDIRECT,  in the last 72 hours ------------------------------------------------------------------------------------------------------------------ No results found for this basename: TSH, T4TOTAL, FREET3, T3FREE, THYROIDAB,  in the last 72 hours ------------------------------------------------------------------------------------------------------------------ No results found for this basename: VITAMINB12, FOLATE, FERRITIN, TIBC, IRON, RETICCTPCT,  in the last 72 hours  Coagulation profile  Recent Labs Lab 01/06/13 0655 01/07/13 0615 01/08/13 0430 01/09/13 0645 01/10/13 0500  INR 2.91* 3.42* 3.20* 3.25* 3.34*    No results found for this basename: DDIMER,  in the last 72 hours  Cardiac Enzymes  Recent Labs Lab 01/08/13 2348 01/09/13 0548 01/09/13 1510  TROPONINI <0.30 <0.30 <0.30   ------------------------------------------------------------------------------------------------------------------ No components found with this basename: POCBNP,

## 2013-01-10 NOTE — Progress Notes (Signed)
ANTICOAGULATION CONSULT NOTE - Follow Up Consult  Pharmacy Consult for warfarin Indication: atrial fibrillation  Allergies  Allergen Reactions  . Morphine Sulfate Other (See Comments)    REACTION: change in personality  . Remeron (Mirtazapine) Other (See Comments)    Altered mental status, lethargy  . Tuna (Fish Allergy) Other (See Comments)    unknown  . Penicillins Rash    Tolerates Ancef.    Patient Measurements: Height: 5\' 3"  (160 cm) Weight: 246 lb 0.5 oz (111.6 kg) IBW/kg (Calculated) : 52.4   Vital Signs: Temp: 98.3 F (36.8 C) (03/09 1130) Temp src: Oral (03/09 1130) BP: 92/49 mmHg (03/09 1130) Pulse Rate: 121 (03/09 1130)  Labs:  Recent Labs  01/08/13 0430 01/08/13 1630 01/08/13 2348 01/09/13 0548 01/09/13 0645 01/09/13 1510 01/10/13 0500  HGB  --   --   --   --   --   --  9.0*  HCT  --   --   --   --   --   --  28.5*  PLT  --   --   --   --   --   --  90*  LABPROT 31.0*  --   --   --  31.4*  --  32.0*  INR 3.20*  --   --   --  3.25*  --  3.34*  CREATININE 1.94* 1.82*  --   --  1.87*  --  2.08*  TROPONINI  --   --  <0.30 <0.30  --  <0.30  --     Estimated Creatinine Clearance: 24.6 ml/min (by C-G formula based on Cr of 2.08).   Assessment: 79 y/o F who continues of warfarin for Afib. INR on admit was sub-therapeutic at 1.76. INR today is increased again to 3.34 after holding dose x 3 days. CBC is low and essentially stable, no bleeding reported.   Noted DDI with amiodarone, dose increased from home dose. INR continues to rise likely due to combination of extra doses on 3/2 and acute illness with fluid overload/congestion now needing milrinone and dopamine to preserve renal perfusion.  Goal of Therapy:  INR 2-3 Monitor platelets by anticoagulation protocol: Yes   Plan:  - Hold warfarin tonight - Daily PT/INR, CBC in am - Monitor for signs and symptoms of bleeding  Thank you for the consult.  Tomi Bamberger, PharmD Clinical Pharmacist Pager:  [phone] Pharmacy: [phone] 01/10/2013 12:31 PM

## 2013-01-11 LAB — PROTIME-INR: INR: 2.96 — ABNORMAL HIGH (ref 0.00–1.49)

## 2013-01-11 LAB — BASIC METABOLIC PANEL
CO2: >45 mEq/L (ref 19–32)
Calcium: 9.9 mg/dL (ref 8.4–10.5)
Creatinine, Ser: 2.1 mg/dL — ABNORMAL HIGH (ref 0.50–1.10)
Glucose, Bld: 171 mg/dL — ABNORMAL HIGH (ref 70–99)

## 2013-01-11 LAB — GLUCOSE, CAPILLARY: Glucose-Capillary: 167 mg/dL — ABNORMAL HIGH (ref 70–99)

## 2013-01-11 LAB — URINE CULTURE

## 2013-01-11 LAB — HEMOGLOBIN AND HEMATOCRIT, BLOOD: Hemoglobin: 9.3 g/dL — ABNORMAL LOW (ref 12.0–15.0)

## 2013-01-11 LAB — MAGNESIUM: Magnesium: 1.9 mg/dL (ref 1.5–2.5)

## 2013-01-11 MED ORDER — WARFARIN SODIUM 1 MG PO TABS
1.0000 mg | ORAL_TABLET | Freq: Once | ORAL | Status: AC
  Administered 2013-01-11: 1 mg via ORAL
  Filled 2013-01-11: qty 1

## 2013-01-11 MED ORDER — FUROSEMIDE 10 MG/ML IJ SOLN
10.0000 mg/h | INTRAVENOUS | Status: DC
  Administered 2013-01-11 – 2013-01-12 (×2): 10 mg/h via INTRAVENOUS
  Filled 2013-01-11 (×5): qty 25

## 2013-01-11 MED ORDER — POTASSIUM CHLORIDE CRYS ER 20 MEQ PO TBCR
40.0000 meq | EXTENDED_RELEASE_TABLET | Freq: Once | ORAL | Status: DC
Start: 2013-01-11 — End: 2013-01-11

## 2013-01-11 MED ORDER — POTASSIUM CHLORIDE CRYS ER 20 MEQ PO TBCR
40.0000 meq | EXTENDED_RELEASE_TABLET | Freq: Two times a day (BID) | ORAL | Status: DC
  Administered 2013-01-11: 40 meq via ORAL
  Filled 2013-01-11 (×2): qty 2

## 2013-01-11 MED ORDER — ACETAZOLAMIDE 250 MG PO TABS
500.0000 mg | ORAL_TABLET | Freq: Two times a day (BID) | ORAL | Status: DC
  Administered 2013-01-11 – 2013-01-17 (×13): 500 mg via ORAL
  Filled 2013-01-11 (×17): qty 2

## 2013-01-11 MED ORDER — POTASSIUM CHLORIDE CRYS ER 20 MEQ PO TBCR
40.0000 meq | EXTENDED_RELEASE_TABLET | Freq: Once | ORAL | Status: AC
  Administered 2013-01-11: 40 meq via ORAL

## 2013-01-11 NOTE — Progress Notes (Signed)
Have reviewed hypotension, SBP now running 96-120.  Milrinone and lasix have been discontinued.  Will continue to hold both agents as well as metolazone today.  Will also place parameters on remainder of HF meds.  Continue dopa at 3.  Continue to monitor closely.  Will reassess BMET in the morning.  Will most likely need RHC in the near future.  Robbi Garter, PAC 1:33 PM

## 2013-01-11 NOTE — Progress Notes (Signed)
Clinical Social Worker submitted updated clincials to [place] at [place].  CSW to continue to follow and assist as needed.   Angelia Mould, MSW, [place] [phone]

## 2013-01-11 NOTE — Progress Notes (Signed)
ANTICOAGULATION CONSULT NOTE - Follow Up Consult  Pharmacy Consult for warfarin Indication: atrial fibrillation  Patient Measurements: Height: 5\' 3"  (160 cm) Weight: 244 lb 11.4 oz (111 kg) IBW/kg (Calculated) : 52.4   Vital Signs: Temp: 97.7 F (36.5 C) (03/10 0837) Temp src: Oral (03/10 0837) BP: 118/65 mmHg (03/10 0837) Pulse Rate: 105 (03/10 0837)  Labs:  Recent Labs  01/08/13 2348 01/09/13 0548 01/09/13 0645 01/09/13 1510 01/10/13 0500 01/11/13 0500  HGB  --   --   --   --  9.0* 9.3*  HCT  --   --   --   --  28.5* 29.5*  PLT  --   --   --   --  90*  --   LABPROT  --   --  31.4*  --  32.0* 29.3*  INR  --   --  3.25*  --  3.34* 2.96*  CREATININE  --   --  1.87*  --  2.08* 2.10*  TROPONINI <0.30 <0.30  --  <0.30  --   --     Estimated Creatinine Clearance: 24.3 ml/min (by C-G formula based on Cr of 2.1).   Assessment: 79 y/o F who continues of warfarin for Afib. INR on admit was sub-therapeutic at 1.76. INR today is down to 2.9 after holding dose x 4 days. CBC is low and essentially stable, no bleeding reported.   Warfarin home dose is 3mg  daily  Goal of Therapy:  INR 2-3 Monitor platelets by anticoagulation protocol: Yes   Plan:  - warfarin 1mg  tonight - Daily PT/INR, CBC in am - Monitor for signs and symptoms of bleeding  Thank you for the consult.  Sheppard Coil, PharmD Clinical Pharmacist Pager: [phone] Pharmacy: [phone] 01/11/2013 8:56 AM

## 2013-01-11 NOTE — Progress Notes (Addendum)
[facility] PROGRESS NOTE Interim History: The patient is a 79 y.o. year-old with hx of morbid obesity, HTN (all her life), CKD III, afib, OSA on CPAP and DM2 x 10-15 years presenting with marked anasarca to ED. She has been either in hospitals or SNF"s or LTAC's for the last 6 months. She fell and broke her R femur and L lower leg requiring surgical repair during this time. She has gotten temporary dialysis during this time, details not available. Creat is 1.6. She says swelling of abdomen and legs has been there for 7 days. Denies SOB, n/v/d, CP or cough, orthopnea. No fam hx of kidney disease. No NSAID"S. Non ambulatory. diuresing in hospital with Dopamine, milrinone, lasix, metolazone and diamox.   Assessment/Plan:  Acute right heart congestive heart failure/ Acute on chronic renal failure, cardio-renal syndrome: - on lasix drip, dopamine, diamox and  metolazone changed to 5 BID. She continue to diuresis well. Replete K, check a mag. - Creatinine trended up. .- 99 kg back 10.24.2014, on admision 117.5 now down to 111 kg. - further management per HF team.   PAF (paroxysmal atrial fibrillation), was in SR 06/2012 : - will differ to cards - Coumadin per pharmacy  - Continue amiodarone and coreg - INR high.  DM with complications (CKD), controlled : - Hemoglobin A1c on this admission 6.8  - Continue sliding scale insulin  - Lantus Insulin Glargine 25 units Q HS     HYPERTENSION : - cont. Bidil - coreg 25 mg PO BID, spironolactone 25 mg daily  Depression: - Continue buspirone   Obesity (BMI 30-39.9) - counseling.  CKD (chronic kidney disease) - baslein cr 1.7    Anemia due to chronic illness - Hbg stable. - cbc in am   OBSTRUCTIVE SLEEP APNEA - c-pap   HYPERTENSION: - Coreg 25 mg PO BID, spironolactone 25 mg daily - Cont bidil, due to her renal function.    Code Status: partial code blue  Family Communication: no family at bedside  Disposition Plan: home or  SNF when stable    Consultants:  HF  Procedures: Echo:ejection fraction was in the range of 50% to 55%, cannot evaluate diatolic function    Antibiotics:  none  HPI/Subjective: Feels better. Abdominal pain worse with foods.  Objective: Filed Vitals:   01/10/13 2054 01/11/13 0011 01/11/13 0333 01/11/13 0409  BP: 107/60 119/78  117/63  Pulse: 110 98 106 110  Temp: 97.9 F (36.6 C) 97.7 F (36.5 C)  97.8 F (36.6 C)  TempSrc: Oral Oral  Oral  Resp: 16 15 18 15   Height:      Weight:    111 kg (244 lb 11.4 oz)  SpO2: 99% 100% 99% 100%    Intake/Output Summary (Last 24 hours) at 01/11/13 0734 Last data filed at 01/11/13 0600  Gross per 24 hour  Intake  939.5 ml  Output   3425 ml  Net -2485.5 ml   Filed Weights   01/10/13 0535 01/10/13 1200 01/11/13 0409  Weight: 113.7 kg (250 lb 10.6 oz) 111.6 kg (246 lb 0.5 oz) 111 kg (244 lb 11.4 oz)    Exam:  General: Alert, awake, oriented x3, in no acute distress.  HEENT: No bruits, no goiter. + JVD Heart: Regular rate and rhythm, without murmurs, rubs, gallops.  Lungs: Good air movement, bilateral air movement.  Abdomen: Soft, nontender,  positive bowel sounds. Anasarca EXT: 3 + edema   Data Reviewed: Basic Metabolic Panel:  Recent Labs Lab 01/07/13 0615  01/08/13 0430 01/08/13 1630 01/09/13 0645 01/10/13 0500 01/11/13 0500  NA 140 138 139 137 134* 134*  K 3.5 3.7 3.6 3.5 3.4* 3.4*  CL 90* 88* 87* 86* 85* 83*  CO2 44* >45* 45* >45* >45* >45*  GLUCOSE 126* 203* 149* 204* 145* 171*  BUN 47* 50* 48* 50* 52* 52*  CREATININE 1.69* 1.94* 1.82* 1.87* 2.08* 2.10*  CALCIUM 9.7 9.8 9.8 9.9 9.8 9.9  MG 1.8  --   --   --   --  2.0   Liver Function Tests: No results found for this basename: AST, ALT, ALKPHOS, BILITOT, PROT, ALBUMIN,  in the last 168 hours No results found for this basename: LIPASE, AMYLASE,  in the last 168 hours No results found for this basename: AMMONIA,  in the last 168 hours CBC:  Recent  Labs Lab 01/05/13 0530 01/06/13 0655 01/10/13 0500 01/11/13 0500  WBC 5.6 5.1 6.2  --   HGB 9.0* 9.7* 9.0* 9.3*  HCT 29.5* 30.8* 28.5* 29.5*  MCV 88.3 86.8 87.2  --   PLT 86* 87* 90*  --    Cardiac Enzymes:  Recent Labs Lab 01/08/13 2348 01/09/13 0548 01/09/13 1510  TROPONINI <0.30 <0.30 <0.30   BNP (last 3 results)  Recent Labs  01/03/13 0800  PROBNP 4706.0*   CBG:  Recent Labs Lab 01/09/13 2233 01/10/13 0832 01/10/13 1208 01/10/13 1740 01/10/13 2124  GLUCAP 165* 120* 136* 139* 218*    Recent Results (from the past 240 hour(s))  MRSA PCR SCREENING     Status: None   Collection Time    01/03/13  1:03 AM      Result Value Range Status   MRSA by PCR NEGATIVE  NEGATIVE Final   Comment:            The GeneXpert MRSA Assay (FDA     approved for NASAL specimens     only), is one component of a     comprehensive MRSA colonization     surveillance program. It is not     intended to diagnose MRSA     infection nor to guide or     monitor treatment for     MRSA infections.  URINE CULTURE     Status: None   Collection Time    01/03/13  3:06 AM      Result Value Range Status   Specimen Description URINE, CATHETERIZED   Final   Special Requests ADDED 0337   Final   Culture  Setup Time 01/03/2013 03:54   Final   Colony Count >=100,000 COLONIES/ML   Final   Culture     Final   Value: KLEBSIELLA PNEUMONIAE     Note: THIS ISOLATE HAS BEEN CONFIRMED AS A [facility] CARBAPENEMASE PRODUCER COLISTIN 0.125 ETEST results for this drug are "FOR INVESTIGATIONAL USE ONLY" and should NOT be used for clinical purposes. CRITICAL RESULT CALLED TO, READ BACK BY AND VERIFIED WITH:      LAURA S (INFECTION CONTROL) ON 3.8.14 @ 1535 BY FERGK   Report Status 01/09/2013 FINAL   Final   Organism ID, Bacteria KLEBSIELLA PNEUMONIAE   Final     Studies: No results found.  Scheduled Meds: . allopurinol  100 mg Oral Daily  . amiodarone  200 mg Oral BID  . busPIRone  5 mg Oral Q12H  .  carvedilol  12.5 mg Oral BID WC  . docusate sodium  100 mg Oral Q8H  . feeding supplement (NEPRO CARB STEADY)  237 mL Oral BID BM  . guaiFENesin  600 mg Oral BID  . insulin aspart  0-5 Units Subcutaneous QHS  . insulin aspart  0-9 Units Subcutaneous TID WC  . insulin glargine  25 Units Subcutaneous QHS  . isosorbide-hydrALAZINE  1 tablet Oral BID  . metolazone  5 mg Oral BID  . OxyCODONE  10 mg Oral Q12H  . pantoprazole  40 mg Oral Daily  . polyethylene glycol  17 g Oral BID  . spironolactone  25 mg Oral Daily  . traZODone  50 mg Oral QHS  . Warfarin - Pharmacist Dosing Inpatient   Does not apply q1800   Continuous Infusions: . DOPamine 2 mcg/kg/min (01/10/13 1628)  . furosemide (LASIX) infusion 10 mg/hr (01/10/13 1256)  . milrinone 0.25 mcg/kg/min (01/11/13 1610)     Marinda Elk  [facility] Pager [phone]. If 8PM-8AM, please contact night-coverage at [email or web address], password [facility] 01/11/2013, 7:34 AM  LOS: 9 days

## 2013-01-11 NOTE — Progress Notes (Addendum)
Nurse went into to help pt and noticed BP reading systolic in the low 90's.  Nurse took another BP measurement and systolic was in 80's.  Nurse stopped IV drip for milrinone and held PO Spironolactone.  Nurse assessed pt; pt denies symptoms, pt tachy mid 120's.  Nurse contacted both MD/PA.  PA acknowledged nurse concerns and instructed nurse to continue to hold both Milrinone,lasix and spironolactone at this time, and stated she would assess pt soon, no additional orders were given at this time.  Nurse will continue to monitor.

## 2013-01-11 NOTE — Progress Notes (Signed)
Advanced Heart Failure Rounding Note   Subjective:    Gina Ashley is an 79 y.o. female with history of chronic atrial fibrillation on coumadin, diastolic heart failure and HTN. She also has DM type 2, COPD, OSA on CPAP and renal failure with baseline Cr 1.5-1.7. She has had 4 hospitalizations in the last 6 months, 1 included tibia/fibula fracture and the rest have been due to massive fluid overload in the setting of diastolic heart failure. She has required short term dialysis in the past for renal failure.   Echo with RV dysfunction and low urine output therefore started on milrinone 0.25 mcg/kg/minon 3/5. Had poor urine output so low dose dopamine added 3/7.   Bicarb getting worse. Diamox for 2 days.  Continues to diurese well, net -2.5 L.  Weight down 2 pounds overnight.  On lasix 10 mg /hr, metolazone 2.5 mg BID, milrinone 0.25 and dopa 2.   Legs continue to feel tight.  Stomach feels better but still has some problems with eating.  Breathing improved.   Cr 2.1.  CVP checked personally 19-20.  AF rate low 100s on dopa, amio 200 mg BID and carvedilol 12.5 mg BID.  Objective:    Vital Signs:   Temp:  [97.7 F (36.5 C)-98.3 F (36.8 C)] 97.8 F (36.6 C) (03/10 0409) Pulse Rate:  [98-121] 110 (03/10 0409) Resp:  [11-18] 15 (03/10 0409) BP: (92-119)/(49-78) 117/63 mmHg (03/10 0409) SpO2:  [95 %-100 %] 100 % (03/10 0409) Weight:  [111 kg (244 lb 11.4 oz)-111.6 kg (246 lb 0.5 oz)] 111 kg (244 lb 11.4 oz) (03/10 0409) Last BM Date: 01/07/13  Weight change: Filed Weights   01/10/13 0535 01/10/13 1200 01/11/13 0409  Weight: 113.7 kg (250 lb 10.6 oz) 111.6 kg (246 lb 0.5 oz) 111 kg (244 lb 11.4 oz)    Intake/Output:   Intake/Output Summary (Last 24 hours) at 01/11/13 0757 Last data filed at 01/11/13 0600  Gross per 24 hour  Intake  939.5 ml  Output   3425 ml  Net -2485.5 ml     Physical Exam: General: Chronically ill appearing. No resp difficulty on n/c  HEENT: normal   Neck: supple. JVP jaw . Carotids 2+ bilat; no bruits. No lymphadenopathy or thryomegaly appreciated.  Cor: Distant. Regular rate & rhythm. No rubs, gallops or murmurs.  Lungs: decreased throughout  Abdomen: obese, soft, nontender, distention much improved. Good bowel sounds.  Extremities: no cyanosis, clubbing, rash, 1+ edema. Well healing scar left proximal tibia  Neuro: alert & orientedx3, cranial nerves grossly intact. moves all 4 extremities w/o difficulty. Affect pleasant   Telemetry: atrial fib 90-110  Labs: Basic Metabolic Panel:  Recent Labs Lab 01/07/13 0615 01/08/13 0430 01/08/13 1630 01/09/13 0645 01/10/13 0500 01/11/13 0500  NA 140 138 139 137 134* 134*  K 3.5 3.7 3.6 3.5 3.4* 3.4*  CL 90* 88* 87* 86* 85* 83*  CO2 44* >45* 45* >45* >45* >45*  GLUCOSE 126* 203* 149* 204* 145* 171*  BUN 47* 50* 48* 50* 52* 52*  CREATININE 1.69* 1.94* 1.82* 1.87* 2.08* 2.10*  CALCIUM 9.7 9.8 9.8 9.9 9.8 9.9  MG 1.8  --   --   --   --  2.0    Liver Function Tests: No results found for this basename: AST, ALT, ALKPHOS, BILITOT, PROT, ALBUMIN,  in the last 168 hours No results found for this basename: LIPASE, AMYLASE,  in the last 168 hours No results found for this basename: AMMONIA,  in the  last 168 hours  CBC:  Recent Labs Lab 01/05/13 0530 01/06/13 0655 01/10/13 0500 01/11/13 0500  WBC 5.6 5.1 6.2  --   HGB 9.0* 9.7* 9.0* 9.3*  HCT 29.5* 30.8* 28.5* 29.5*  MCV 88.3 86.8 87.2  --   PLT 86* 87* 90*  --     Cardiac Enzymes:  Recent Labs Lab 01/08/13 2348 01/09/13 0548 01/09/13 1510  TROPONINI <0.30 <0.30 <0.30    BNP: BNP (last 3 results)  Recent Labs  01/03/13 0800  PROBNP 4706.0*      Imaging: No results found.   Medications:     Scheduled Medications: . acetaZOLAMIDE  500 mg Oral BID  . allopurinol  100 mg Oral Daily  . amiodarone  200 mg Oral BID  . busPIRone  5 mg Oral Q12H  . carvedilol  12.5 mg Oral BID WC  . docusate sodium  100 mg  Oral Q8H  . feeding supplement (NEPRO CARB STEADY)  237 mL Oral BID BM  . guaiFENesin  600 mg Oral BID  . insulin aspart  0-5 Units Subcutaneous QHS  . insulin aspart  0-9 Units Subcutaneous TID WC  . insulin glargine  25 Units Subcutaneous QHS  . isosorbide-hydrALAZINE  1 tablet Oral BID  . metolazone  5 mg Oral BID  . OxyCODONE  10 mg Oral Q12H  . pantoprazole  40 mg Oral Daily  . polyethylene glycol  17 g Oral BID  . potassium chloride  40 mEq Oral BID  . potassium chloride  40 mEq Oral Once  . spironolactone  25 mg Oral Daily  . traZODone  50 mg Oral QHS  . Warfarin - Pharmacist Dosing Inpatient   Does not apply q1800    Infusions: . DOPamine 2 mcg/kg/min (01/10/13 1628)  . furosemide (LASIX) infusion 10 mg/hr (01/10/13 1256)  . milrinone 0.25 mcg/kg/min (01/11/13 1610)    PRN Medications: acetaminophen, albuterol, alum & mag hydroxide-simeth, diphenhydrAMINE, HYDROcodone-acetaminophen, ipratropium, lactulose, nitroGLYCERIN, ondansetron (ZOFRAN) IV, oxyCODONE, simethicone   Assessment:   1. Acute on chronic right sided heart failure  2. Acute on chronic respiratory failure  3. Acute on chronic renal failure.  4. HTN  5. Obesity, suspect OHS/OSA  6. Chronic atrial flutter  - on coumadin  7. Deconditioning 8. Metabolic alkalosis 9. Hypokalemia  Plan/Discussion:    Still markedly fluid overloaded but diuresing slowly on lasix 10 mg/hr, metolazone 2.5 mg BID, dopa 3 and milrinone 0.25.  Her Cr is up from baseline but stable ~2.  At this time will continue current medications but will add back diamox 500 mg BID for alkalosis.  Supp K. Baseline weight apparently ~215-220 pounds.   The patient will most likely need dialysis in the future.  Will continue to push diuresis at this time.    AF is chronic on amio for rate control. Has failed DC-CV in past. Carvedilol cut back due to low BP   Length of Stay: 9  Daniel Bensimhon,MD 7:58 AM

## 2013-01-12 LAB — BASIC METABOLIC PANEL
CO2: 45 mEq/L (ref 19–32)
Chloride: 84 mEq/L — ABNORMAL LOW (ref 96–112)
Glucose, Bld: 139 mg/dL — ABNORMAL HIGH (ref 70–99)
Potassium: 3.8 mEq/L (ref 3.5–5.1)
Sodium: 134 mEq/L — ABNORMAL LOW (ref 135–145)

## 2013-01-12 LAB — GLUCOSE, CAPILLARY: Glucose-Capillary: 117 mg/dL — ABNORMAL HIGH (ref 70–99)

## 2013-01-12 LAB — PROTIME-INR: INR: 2.26 — ABNORMAL HIGH (ref 0.00–1.49)

## 2013-01-12 LAB — CARBOXYHEMOGLOBIN
Carboxyhemoglobin: 1.4 % (ref 0.5–1.5)
O2 Saturation: 77.6 %
Total hemoglobin: 9.8 g/dL — ABNORMAL LOW (ref 12.0–16.0)

## 2013-01-12 MED ORDER — WARFARIN SODIUM 4 MG PO TABS
4.0000 mg | ORAL_TABLET | Freq: Once | ORAL | Status: AC
  Administered 2013-01-12: 4 mg via ORAL
  Filled 2013-01-12: qty 1

## 2013-01-12 MED ORDER — BISACODYL 10 MG RE SUPP
10.0000 mg | Freq: Once | RECTAL | Status: AC
  Administered 2013-01-12: 10 mg via RECTAL
  Filled 2013-01-12: qty 1

## 2013-01-12 NOTE — Progress Notes (Signed)
Advanced Heart Failure Rounding Note   Subjective:    Gina Ashley is an 79 y.o. female with history of chronic atrial fibrillation on coumadin, diastolic heart failure and HTN. She also has DM type 2, COPD, OSA on CPAP and renal failure with baseline Cr 1.5-1.7. She has had 4 hospitalizations in the last 6 months, 1 included tibia/fibula fracture and the rest have been due to massive fluid overload in the setting of diastolic heart failure. She has required short term dialysis in the past for renal failure.   Echo with RV dysfunction and low urine output therefore started on milrinone 0.25 mcg/kg/min on 3/5. Had poor urine output so low dose dopamine added 3/7.   Bicarb mildly improved yesterday with restarting diamox.  BP down yesterday so milrinone and metolazone stopped yesterday. IV lasix restarted. Continues to diurese well, net -4.9 L.  Weight down 6 pounds overnight.   Legs not feeling as tight.  Stomach feels better but needs to have a bowel movement.  Breathing better.    Cr 2.12.  CVP checked personally 19-20.  AF rate 90-100s on dopa, amio 200 mg BID and carvedilol 12.5 mg BID.  Objective:    Vital Signs:   Temp:  [97.4 F (36.3 C)-98 F (36.7 C)] 97.4 F (36.3 C) (03/11 0731) Pulse Rate:  [76-120] 94 (03/11 0731) Resp:  [10-29] 13 (03/11 0731) BP: (82-138)/(47-84) 121/82 mmHg (03/11 0731) SpO2:  [96 %-100 %] 100 % (03/11 0731) FiO2 (%):  [40 %] 40 % (03/10 2347) Weight:  [108.2 kg (238 lb 8.6 oz)] 108.2 kg (238 lb 8.6 oz) (03/11 0500) Last BM Date: 01/07/13  Weight change: Filed Weights   01/10/13 1200 01/11/13 0409 01/12/13 0500  Weight: 111.6 kg (246 lb 0.5 oz) 111 kg (244 lb 11.4 oz) 108.2 kg (238 lb 8.6 oz)    Intake/Output:   Intake/Output Summary (Last 24 hours) at 01/12/13 0920 Last data filed at 01/12/13 0733  Gross per 24 hour  Intake  179.4 ml  Output   5425 ml  Net -5245.6 ml     Physical Exam: General: Chronically ill appearing. No resp  difficulty on n/c  HEENT: normal  Neck: supple. JVP jaw . Carotids 2+ bilat; no bruits. No lymphadenopathy or thryomegaly appreciated.  Cor: Distant. Regular rate & rhythm. No rubs, gallops or murmurs.  Lungs: decreased throughout  Abdomen: obese, soft, nontender, distention much improved. Good bowel sounds.  Extremities: no cyanosis, clubbing, rash, 1+ edema. Well healing scar left proximal tibia  Neuro: alert & orientedx3, cranial nerves grossly intact. moves all 4 extremities w/o difficulty. Affect pleasant   Telemetry: atrial fib 90-110  Labs: Basic Metabolic Panel:  Recent Labs Lab 01/07/13 0615  01/08/13 1630 01/09/13 0645 01/10/13 0500 01/11/13 0500 01/11/13 0810 01/12/13 0500  NA 140  < > 139 137 134* 134*  --  134*  K 3.5  < > 3.6 3.5 3.4* 3.4*  --  3.8  CL 90*  < > 87* 86* 85* 83*  --  84*  CO2 44*  < > 45* >45* >45* >45*  --  45*  GLUCOSE 126*  < > 149* 204* 145* 171*  --  139*  BUN 47*  < > 48* 50* 52* 52*  --  54*  CREATININE 1.69*  < > 1.82* 1.87* 2.08* 2.10*  --  2.12*  CALCIUM 9.7  < > 9.8 9.9 9.8 9.9  --  10.2  MG 1.8  --   --   --   --  2.0 1.9  --   < > = values in this interval not displayed.  Liver Function Tests: No results found for this basename: AST, ALT, ALKPHOS, BILITOT, PROT, ALBUMIN,  in the last 168 hours No results found for this basename: LIPASE, AMYLASE,  in the last 168 hours No results found for this basename: AMMONIA,  in the last 168 hours  CBC:  Recent Labs Lab 01/06/13 0655 01/10/13 0500 01/11/13 0500  WBC 5.1 6.2  --   HGB 9.7* 9.0* 9.3*  HCT 30.8* 28.5* 29.5*  MCV 86.8 87.2  --   PLT 87* 90*  --     Cardiac Enzymes:  Recent Labs Lab 01/08/13 2348 01/09/13 0548 01/09/13 1510  TROPONINI <0.30 <0.30 <0.30    BNP: BNP (last 3 results)  Recent Labs  01/03/13 0800  PROBNP 4706.0*      Imaging: No results found.   Medications:     Scheduled Medications: . acetaZOLAMIDE  500 mg Oral BID  . allopurinol   100 mg Oral Daily  . amiodarone  200 mg Oral BID  . busPIRone  5 mg Oral Q12H  . carvedilol  12.5 mg Oral BID WC  . docusate sodium  100 mg Oral Q8H  . feeding supplement (NEPRO CARB STEADY)  237 mL Oral BID BM  . guaiFENesin  600 mg Oral BID  . insulin aspart  0-5 Units Subcutaneous QHS  . insulin aspart  0-9 Units Subcutaneous TID WC  . insulin glargine  25 Units Subcutaneous QHS  . isosorbide-hydrALAZINE  1 tablet Oral BID  . OxyCODONE  10 mg Oral Q12H  . pantoprazole  40 mg Oral Daily  . polyethylene glycol  17 g Oral BID  . spironolactone  25 mg Oral Daily  . traZODone  50 mg Oral QHS  . warfarin  4 mg Oral ONCE-1800  . Warfarin - Pharmacist Dosing Inpatient   Does not apply q1800    Infusions: . DOPamine 2 mcg/kg/min (01/10/13 1628)  . furosemide (LASIX) infusion 10 mg/hr (01/11/13 1655)    PRN Medications: acetaminophen, albuterol, alum & mag hydroxide-simeth, diphenhydrAMINE, HYDROcodone-acetaminophen, ipratropium, lactulose, nitroGLYCERIN, ondansetron (ZOFRAN) IV, oxyCODONE, simethicone   Assessment:   1. Acute on chronic right sided heart failure  2. Acute on chronic respiratory failure  3. Acute on chronic renal failure.  4. HTN  5. Obesity, suspect OHS/OSA  6. Chronic atrial flutter  - on coumadin  7. Deconditioning 8. Metabolic alkalosis 9. Hypokalemia 10. Hypotension, resolved  Plan/Discussion:    Diuresis picked up yesterday, continues on lasix 10 mg/hr and dopa 2 as CVP remains high.  Will not add back milrinone at this time with recent hypotension.  Cr remains stable around 2.  Continue diamox for now. Baseline weight apparently ~215-220 pounds.   The patient will most likely need dialysis in the future.  As she is diuresing now an Cr relatively stable will continue to push with current regimen.      AF is chronic on amio for rate control. Has failed DC-CV in past. Carvedilol cut back due to low BP   Length of Stay: 10 Daniel Bensimhon,MD 9:21  AM

## 2013-01-12 NOTE — Progress Notes (Signed)
Pt has opted not to wear BIPAP tonight.  She is on 2lpm [place] and saturations are in upper 90's to 100.  Pt says she doesn't rest well while wearing BIPAP.

## 2013-01-12 NOTE — Progress Notes (Signed)
[facility] PROGRESS NOTE Interim History: The patient is a 79 y.o. year-old with hx of morbid obesity, HTN (all her life), CKD III, afib, OSA on CPAP and DM2 x 10-15 years presenting with marked anasarca to ED. She has been either in hospitals or SNF"s or LTAC's for the last 6 months. She fell and broke her R femur and L lower leg requiring surgical repair during this time. She has gotten temporary dialysis during this time, details not available. Creat is 1.6. She says swelling of abdomen and legs has been there for 7 days. Denies SOB, n/v/d, CP or cough, orthopnea. No fam hx of kidney disease. No NSAID"S. Non ambulatory. diuresing in hospital with Dopamine, milrinone, lasix, metolazone and diamox.   Assessment/Plan:  Acute right heart congestive heart failure/ Acute on chronic renal failure, cardio-renal syndrome: - on lasix drip, dopamine, diamox and  metolazone changed to 5 BID. She continue to diuresis well. Off milrinone, has episode of hypotension on 3.10. - continue to monitor electrolytes and replete - Creatinine has stabilize at 2.0.- ? If her real creatinine is around 2 and she has been hemodiluted all this time. .- On admision 117.5 now down to 108kg. - further management per HF team. - get Physical therapy. OOB   PAF (paroxysmal atrial fibrillation), was in SR 06/2012 : - will differ to cards. RVR resolved. - Coumadin per pharmacy  - Continue amiodarone and coreg - INR high.  DM with complications (CKD), controlled : - Hemoglobin A1c on this admission 6.8  - Continue sliding scale insulin & - Lantus, excellent control.    HYPERTENSION : - cont. Bidil - coreg 25 mg PO BID, spironolactone 25 mg daily  Depression: - Continue buspirone   Obesity (BMI 30-39.9) - counseling.  CKD (chronic kidney disease) - ? Is baseline creatinine is around 2.0    Anemia due to chronic illness - Hbg stable. - cbc in am   OBSTRUCTIVE SLEEP APNEA - c-pap   HYPERTENSION: -  Coreg 25 mg PO BID, spironolactone 25 mg daily - Cont bidil, due to her renal function.    Code Status: partial code blue  Family Communication: no family at bedside  Disposition Plan: home or SNF when stable    Consultants:  HF  Procedures: Echo:ejection fraction was in the range of 50% to 55%, cannot evaluate diatolic function    Antibiotics:  none  HPI/Subjective: Feels better. She relates her can move her legs.  Objective: Filed Vitals:   01/11/13 2347 01/12/13 0300 01/12/13 0500 01/12/13 0731  BP: 126/77 108/56  121/82  Pulse: 89 114  94  Temp:   98 F (36.7 C) 97.4 F (36.3 C)  TempSrc:   Oral Oral  Resp: 16 12  13   Height:      Weight:   108.2 kg (238 lb 8.6 oz)   SpO2: 100% 99%  100%    Intake/Output Summary (Last 24 hours) at 01/12/13 0735 Last data filed at 01/12/13 0733  Gross per 24 hour  Intake    226 ml  Output   5825 ml  Net  -5599 ml   Filed Weights   01/10/13 1200 01/11/13 0409 01/12/13 0500  Weight: 111.6 kg (246 lb 0.5 oz) 111 kg (244 lb 11.4 oz) 108.2 kg (238 lb 8.6 oz)    Exam:  General: Alert, awake, oriented x3, in no acute distress.  HEENT: No bruits, no goiter. + JVD Heart: Regular rate and rhythm, without murmurs, rubs, gallops.  Lungs: Good  air movement, bilateral air movement.  Abdomen: Soft, nontender,  positive bowel sounds. Anasarca EXT: 3 + edema, but less now.   Data Reviewed: Basic Metabolic Panel:  Recent Labs Lab 01/07/13 0615  01/08/13 1630 01/09/13 0645 01/10/13 0500 01/11/13 0500 01/11/13 0810 01/12/13 0500  NA 140  < > 139 137 134* 134*  --  134*  K 3.5  < > 3.6 3.5 3.4* 3.4*  --  3.8  CL 90*  < > 87* 86* 85* 83*  --  84*  CO2 44*  < > 45* >45* >45* >45*  --  45*  GLUCOSE 126*  < > 149* 204* 145* 171*  --  139*  BUN 47*  < > 48* 50* 52* 52*  --  54*  CREATININE 1.69*  < > 1.82* 1.87* 2.08* 2.10*  --  2.12*  CALCIUM 9.7  < > 9.8 9.9 9.8 9.9  --  10.2  MG 1.8  --   --   --   --  2.0 1.9  --   < >  = values in this interval not displayed. Liver Function Tests: No results found for this basename: AST, ALT, ALKPHOS, BILITOT, PROT, ALBUMIN,  in the last 168 hours No results found for this basename: LIPASE, AMYLASE,  in the last 168 hours No results found for this basename: AMMONIA,  in the last 168 hours CBC:  Recent Labs Lab 01/06/13 0655 01/10/13 0500 01/11/13 0500  WBC 5.1 6.2  --   HGB 9.7* 9.0* 9.3*  HCT 30.8* 28.5* 29.5*  MCV 86.8 87.2  --   PLT 87* 90*  --    Cardiac Enzymes:  Recent Labs Lab 01/08/13 2348 01/09/13 0548 01/09/13 1510  TROPONINI <0.30 <0.30 <0.30   BNP (last 3 results)  Recent Labs  01/03/13 0800  PROBNP 4706.0*   CBG:  Recent Labs Lab 01/10/13 2124 01/11/13 0835 01/11/13 1138 01/11/13 1602 01/11/13 2121  GLUCAP 218* 141* 166* 191* 167*    Recent Results (from the past 240 hour(s))  MRSA PCR SCREENING     Status: None   Collection Time    01/03/13  1:03 AM      Result Value Range Status   MRSA by PCR NEGATIVE  NEGATIVE Final   Comment:            The GeneXpert MRSA Assay (FDA     approved for NASAL specimens     only), is one component of a     comprehensive MRSA colonization     surveillance program. It is not     intended to diagnose MRSA     infection nor to guide or     monitor treatment for     MRSA infections.  URINE CULTURE     Status: None   Collection Time    01/03/13  3:06 AM      Result Value Range Status   Specimen Description URINE, CATHETERIZED   Final   Special Requests ADDED 0337   Final   Culture  Setup Time 01/03/2013 03:54   Final   Colony Count >=100,000 COLONIES/ML   Final   Culture     Final   Value: KLEBSIELLA PNEUMONIAE     Note: THIS ISOLATE HAS BEEN CONFIRMED AS A KPC CARBAPENEMASE PRODUCER COLISTIN 0.125 ETEST results for this drug are "FOR INVESTIGATIONAL USE ONLY" and should NOT be used for clinical purposes. CRITICAL RESULT CALLED TO, READ BACK BY AND VERIFIED WITH:  LAURA S (INFECTION  CONTROL) ON 3.8.14 @ 1535 BY FERGK   Report Status 01/09/2013 FINAL   Final   Organism ID, Bacteria KLEBSIELLA PNEUMONIAE   Final     Studies: No results found.  Scheduled Meds: . acetaZOLAMIDE  500 mg Oral BID  . allopurinol  100 mg Oral Daily  . amiodarone  200 mg Oral BID  . busPIRone  5 mg Oral Q12H  . carvedilol  12.5 mg Oral BID WC  . docusate sodium  100 mg Oral Q8H  . feeding supplement (NEPRO CARB STEADY)  237 mL Oral BID BM  . guaiFENesin  600 mg Oral BID  . insulin aspart  0-5 Units Subcutaneous QHS  . insulin aspart  0-9 Units Subcutaneous TID WC  . insulin glargine  25 Units Subcutaneous QHS  . isosorbide-hydrALAZINE  1 tablet Oral BID  . OxyCODONE  10 mg Oral Q12H  . pantoprazole  40 mg Oral Daily  . polyethylene glycol  17 g Oral BID  . spironolactone  25 mg Oral Daily  . traZODone  50 mg Oral QHS  . warfarin  4 mg Oral ONCE-1800  . Warfarin - Pharmacist Dosing Inpatient   Does not apply q1800   Continuous Infusions: . DOPamine 2 mcg/kg/min (01/10/13 1628)  . furosemide (LASIX) infusion 10 mg/hr (01/11/13 1655)     Marinda Elk  [facility] Pager [phone]. If 8PM-8AM, please contact night-coverage at [email or web address], password [facility] 01/12/2013, 7:35 AM  LOS: 10 days

## 2013-01-12 NOTE — Progress Notes (Signed)
Rt came to assess pt on BIPAP.  Pt had removed herself in the night.  Pt had made comment that she didn't feel she needed it anymore, and can only tolerate it for a little while.  Saturations on 2lpm [place] are 95-100%

## 2013-01-12 NOTE — Progress Notes (Signed)
ANTICOAGULATION CONSULT NOTE - Follow Up Consult  Pharmacy Consult for warfarin Indication: atrial fibrillation  Patient Measurements: Height: 5\' 3"  (160 cm) Weight: 238 lb 8.6 oz (108.2 kg) IBW/kg (Calculated) : 52.4   Vital Signs: Temp: 98 F (36.7 C) (03/11 0500) Temp src: Oral (03/11 0500) BP: 108/56 mmHg (03/11 0300) Pulse Rate: 114 (03/11 0300)  Labs:  Recent Labs  01/09/13 1510 01/10/13 0500 01/11/13 0500 01/12/13 0500  HGB  --  9.0* 9.3*  --   HCT  --  28.5* 29.5*  --   PLT  --  90*  --   --   LABPROT  --  32.0* 29.3* 24.0*  INR  --  3.34* 2.96* 2.26*  CREATININE  --  2.08* 2.10* 2.12*  TROPONINI <0.30  --   --   --     Estimated Creatinine Clearance: 23.7 ml/min (by C-G formula based on Cr of 2.12).   Assessment: 79 y/o F who continues of warfarin for Afib. INR on admit was sub-therapeutic at 1.76. INR today is down to 2.2 after holding dose x 4 days. CBC is low and essentially stable, no bleeding reported.   Warfarin home dose is 3mg  daily  Goal of Therapy:  INR 2-3 Monitor platelets by anticoagulation protocol: Yes   Plan:  - warfarin 4mg  tonight in hopes of reversing trend - Daily PT/INR, CBC in am - Monitor for signs and symptoms of bleeding  Thank you for the consult.  Sheppard Coil, PharmD Clinical Pharmacist Pager: [phone] Pharmacy: [phone] 01/12/2013 7:18 AM

## 2013-01-12 NOTE — Progress Notes (Signed)
Clinical Social Worker staffed case with Bjorn Loser at [facility]; Gina Ashley is able to offer a bed once pt is medically stable for dc.  CSW to continue to follow and assist as needed.   Angelia Mould, MSW, [place] [phone]

## 2013-01-12 NOTE — Progress Notes (Signed)
Physical Therapy Treatment Patient Details Name: Gina Ashley MRN: [id number] DOB: 1927-02-10 Today's Date: 01/12/2013 Time: 1191-4782 PT Time Calculation (min): 49 min  PT Assessment / Plan / Recommendation Comments on Treatment Session  Adm. sob, chf, anasarca with LLE fx from [facility]. Weakness persists today. Pt with jumpy asterixis type motor control difficulties today specifically with upper extremities. Unable to scoot to chair today. Emphasized need for patient to be getting OOB daily with nursing using lift and RN was made aware. Patient and daughter educated on HEP.     Follow Up Recommendations        Does the patient have the potential to tolerate intense rehabilitation     Barriers to Discharge        Equipment Recommendations       Recommendations for Other Services    Frequency     Plan      Precautions / Restrictions Precautions Precautions: Fall Required Braces or Orthoses: Knee Immobilizer - Left Knee Immobilizer - Left: On when out of bed or walking Restrictions Weight Bearing Restrictions: Yes LLE Weight Bearing: Non weight bearing   Pertinent Vitals/Pain Chronic back pain noted, RN made aware    Mobility  Bed Mobility Bed Mobility: Supine to Sit Rolling Left: 3: Mod assist;With rail Sitting - Scoot to Edge of Bed: 3: Mod assist Scooting to [facility]: With rail Details for Bed Mobility Assistance: sequencing cues, pt with jumpy asterixis type movement of upper extremities and trunk needing assist to control; use of pad to assist with scooting EOB +2totalpt20% to lie back down and get her repositioned Transfers Details for Transfer Assistance: attempted lateral scoot transfer from bed->drop arm recliner to the right however pt with decreased control of RLE today and difficulty sequencing, her hips started sliding out and she needed assist to scoot back and lie back down; scooting transfer aborted    Exercises General Exercises - Lower Extremity Ankle  Circles/Pumps: AROM;Right;10 reps;Supine Heel Slides: AAROM;Right;10 reps;Supine Hip ABduction/ADduction: AAROM;Right;10 reps;Supine Other Exercises Other Exercises: Passive DF stretch on LLE, 10x with 3 second holds (patient able to use leg lifter to assist and perform independently, cued for proper ankle positioning)    PT Goals Acute Rehab PT Goals PT Goal: Rolling Supine to Right Side - Progress: Progressing toward goal PT Goal: Supine/Side to Sit - Progress: Progressing toward goal PT Goal: Sit at Edge Of Bed - Progress: Progressing toward goal PT Goal: Sit to Supine/Side - Progress: Progressing toward goal PT Transfer Goal: Bed to Chair/Chair to Bed - Progress: Progressing toward goal  Visit Information  Last PT Received On: 01/12/13 Assistance Needed: +2    Subjective Data  Subjective: I haven't been up in a while.  Patient Stated Goal: home   Cognition  Cognition Overall Cognitive Status: Appears within functional limits for tasks assessed/performed Arousal/Alertness: Awake/alert Orientation Level: Appears intact for tasks assessed Behavior During Session: [facility] for tasks performed    Balance  Static Sitting Balance Static Sitting - Balance Support: Right upper extremity supported Static Sitting - Level of Assistance: 4: Min assist Static Sitting - Comment/# of Minutes: initially pt with lean posteriorly and to the right, assistance to square hips EOB and then patient able to sit there with mingaurdA; did have jumpiness in her upper extremities limiting her stabiliy sitting EOB; sat there 5 minutes  End of Session PT - End of Session Equipment Utilized During Treatment: Gait belt;Left knee immobilizer Activity Tolerance: Patient limited by fatigue Patient left: in bed;with call bell/phone within  reach Nurse Communication: Mobility status;Need for lift equipment   GP     [facility] HELEN 01/12/2013, 12:45 PM

## 2013-01-13 LAB — BASIC METABOLIC PANEL
CO2: 45 mEq/L (ref 19–32)
Calcium: 10 mg/dL (ref 8.4–10.5)
GFR calc non Af Amer: 20 mL/min — ABNORMAL LOW (ref >90)
Glucose, Bld: 179 mg/dL — ABNORMAL HIGH (ref 70–99)
Potassium: 3.4 mEq/L — ABNORMAL LOW (ref 3.5–5.1)
Sodium: 137 mEq/L (ref 135–145)

## 2013-01-13 LAB — CBC
Hemoglobin: 9.3 g/dL — ABNORMAL LOW (ref 12.0–15.0)
MCHC: 31.5 g/dL (ref 30.0–36.0)
RBC: 3.43 MIL/uL — ABNORMAL LOW (ref 3.87–5.11)
RDW: 16.3 % — ABNORMAL HIGH (ref 11.5–15.5)
WBC: 5.1 10*3/uL (ref 4.0–10.5)

## 2013-01-13 LAB — PROTIME-INR: INR: 2.04 — ABNORMAL HIGH (ref 0.00–1.49)

## 2013-01-13 LAB — GLUCOSE, CAPILLARY
Glucose-Capillary: 131 mg/dL — ABNORMAL HIGH (ref 70–99)
Glucose-Capillary: 176 mg/dL — ABNORMAL HIGH (ref 70–99)

## 2013-01-13 MED ORDER — WARFARIN SODIUM 4 MG PO TABS
4.0000 mg | ORAL_TABLET | Freq: Once | ORAL | Status: AC
  Administered 2013-01-13: 4 mg via ORAL
  Filled 2013-01-13: qty 1

## 2013-01-13 NOTE — Progress Notes (Signed)
ANTICOAGULATION CONSULT NOTE - Follow Up Consult  Pharmacy Consult for warfarin Indication: atrial fibrillation  Patient Measurements: Height: 5\' 3"  (160 cm) Weight: 228 lb 9.9 oz (103.7 kg) IBW/kg (Calculated) : 52.4   Vital Signs: Temp: 98.5 F (36.9 C) (03/12 0737) Temp src: Oral (03/12 0737) BP: 124/71 mmHg (03/12 0737) Pulse Rate: 98 (03/12 0737)  Labs:  Recent Labs  01/11/13 0500 01/12/13 0500 01/13/13 0500  HGB 9.3*  --  9.3*  HCT 29.5*  --  29.5*  PLT  --   --  112*  LABPROT 29.3* 24.0* 22.2*  INR 2.96* 2.26* 2.04*  CREATININE 2.10* 2.12* 2.14*    Estimated Creatinine Clearance: 22.9 ml/min (by C-G formula based on Cr of 2.14).   Assessment: 79 y/o F who continues of warfarin for Afib. INR on admit was sub-therapeutic at 1.76. INR today is down further to 2.0 likely due to holding dose x 4 days earlier. CBC is low and essentially stable, no bleeding reported.   Warfarin home dose is 3mg  daily  Goal of Therapy:  INR 2-3 Monitor platelets by anticoagulation protocol: Yes   Plan:  - Repeat warfarin 4mg  tonight in hopes of reversing trend - Daily PT/INR, CBC in am - Monitor for signs and symptoms of bleeding  Thank you for the consult.  Sheppard Coil, PharmD Clinical Pharmacist Pager: [phone] Pharmacy: [phone] 01/13/2013 11:06 AM

## 2013-01-13 NOTE — Progress Notes (Signed)
[facility] PROGRESS NOTE  Interim History: 79 y.o. year-old with hx of morbid obesity, HTN, CKD III, afib, OSA on CPAP and DM2 x 10-15 years presenting with marked anasarca to ED. She has been either in hospitals or SNF"s or LTAC's for the last 6 months. She fell and broke her R femur and L lower leg requiring surgical repair during this time. She has gotten temporary dialysis during this time, details not available. Creat is 1.6. She says swelling of abdomen and legs has been there for 7 days. Denies SOB, n/v/d, CP or cough, orthopnea. No fam hx of kidney disease. No NSAID"S. Non ambulatory.  The CHF Team has been actively following this patient.  She has been diuresing with Dopamine, milrinone, lasix, metolazone and diamox therapies.  Assessment/Plan:  Acute right heart congestive heart failure/ Acute on chronic kidney disease stage 3, cardio-renal syndrome: - diuresis per CHF Team - continue to monitor electrolytes and replete - On admission 117.5 now down to 104kg. (10 lbs off overnight into 3/12) -baseline Scr (09/15/12) was 1.14- ? If has new baseline now -GFR now in CKD stage 4 range  PAF (paroxysmal atrial fibrillation), was in SR 06/2012 : - will differ to cards/rate controlled - Coumadin per pharmacy   Physical deconditioning -multiple hospitalizations over past several months with most recent location an LTAC prior to this admit -PT/OT following  DM with complications (CKD), controlled : - Hemoglobin A1c on this admission 6.8  - Continue sliding scale insulin & Lantus    HYPERTENSION : - cont. Bidil, coreg and spironolactone  Depression: - Continue buspirone  Obesity (BMI 30-39.9) - counseling.  Anemia due to chronic illness - Hbg stable. - cbc in am  OBSTRUCTIVE SLEEP APNEA - c-pap  Code Status: partial code blue - no CPR or intubation but OK with ant-arrhythmic meds, defib/cardiovert and vasoactive drugs Family Communication: no family at bedside   Disposition Plan: SDU; home or SNF when stable  DVT prophylaxis:  warfarin  Consultants:  HF Team/[place]  Procedures: 2-D echocardiogram (01/04/13) Study Conclusions - Left ventricle: The cavity size was normal. There was moderate concentric hypertrophy. Systolic function was normal. The estimated ejection fraction was in the range of 50% to 55%. LV diastolic function cannot be assessed due to atrial fibrillation. - Aortic valve: Mildly calcified leaflets with reduced leaflet excursion. Transvalvular velocity was minimally increased. - Mitral valve: Heavily calcified posterior annulus. - Left atrium: Severely dilated (28 cm2). - Right atrium: Moderately dilated. - Tricuspid valve: Mild regurgitation. - Pulmonary arteries: PA peak pressure: 35mm Hg (S). - Pericardium, extracardiac: There was no pericardial effusion.  Antibiotics:  none  HPI/Subjective: States feels much better. No difficulty with shortness of breath or chest pain. Wants to improve enough to discharge to home  Objective: Filed Vitals:   01/13/13 0400 01/13/13 0600 01/13/13 0700 01/13/13 0737  BP: 105/53 120/71 150/90 124/71  Pulse: 104 94 97 98  Temp: 98.1 F (36.7 C)   98.5 F (36.9 C)  TempSrc: Oral   Oral  Resp: 12 15 14 26   Height:      Weight: 103.7 kg (228 lb 9.9 oz)     SpO2: 98% 100% 100% 100%    Intake/Output Summary (Last 24 hours) at 01/13/13 1115 Last data filed at 01/13/13 0643  Gross per 24 hour  Intake  743.6 ml  Output   5385 ml  Net -4641.4 ml   Filed Weights   01/11/13 0409 01/12/13 0500 01/13/13 0400  Weight: 111 kg (  244 lb 11.4 oz) 108.2 kg (238 lb 8.6 oz) 103.7 kg (228 lb 9.9 oz)    Exam:  General: Alert, awake, oriented x3, in no acute distress.  HEENT: No bruits, no goiter Heart: Irregular rate and atrial fib rhythm, without murmurs, rubs, gallops - 1+ bilateral LE edema slightly more swelling right foot Lungs: Basilar crackles but o/w clear, 4 L oxygen, no  tachypnea Abdomen: Soft, nontender,  positive bowel sounds - Anasarca - obese Musculoskeletal: Extremities symmetrical without any acute trauma or erythema, no clubbing Neurological: Alert and oriented x3, moves all extremities x4, cranial nerves II through XII grossly intact  Data Reviewed: Basic Metabolic Panel:  Recent Labs Lab 01/07/13 0615  01/09/13 0645 01/10/13 0500 01/11/13 0500 01/11/13 0810 01/12/13 0500 01/13/13 0500  NA 140  < > 137 134* 134*  --  134* 137  K 3.5  < > 3.5 3.4* 3.4*  --  3.8 3.4*  CL 90*  < > 86* 85* 83*  --  84* 84*  CO2 44*  < > >45* >45* >45*  --  45* 45*  GLUCOSE 126*  < > 204* 145* 171*  --  139* 179*  BUN 47*  < > 50* 52* 52*  --  54* 56*  CREATININE 1.69*  < > 1.87* 2.08* 2.10*  --  2.12* 2.14*  CALCIUM 9.7  < > 9.9 9.8 9.9  --  10.2 10.0  MG 1.8  --   --   --  2.0 1.9  --   --   < > = values in this interval not displayed.  CBC:  Recent Labs Lab 01/10/13 0500 01/11/13 0500 01/13/13 0500  WBC 6.2  --  5.1  HGB 9.0* 9.3* 9.3*  HCT 28.5* 29.5* 29.5*  MCV 87.2  --  86.0  PLT 90*  --  112*   Cardiac Enzymes:  Recent Labs Lab 01/08/13 2348 01/09/13 0548 01/09/13 1510  TROPONINI <0.30 <0.30 <0.30   BNP (last 3 results)  Recent Labs  01/03/13 0800  PROBNP 4706.0*   CBG:  Recent Labs Lab 01/12/13 0730 01/12/13 1153 01/12/13 1641 01/12/13 2132 01/13/13 0735  GLUCAP 117* 141* 293* 193* 131*    Studies: No results found.  Scheduled Meds: . acetaZOLAMIDE  500 mg Oral BID  . allopurinol  100 mg Oral Daily  . amiodarone  200 mg Oral BID  . busPIRone  5 mg Oral Q12H  . carvedilol  12.5 mg Oral BID WC  . docusate sodium  100 mg Oral Q8H  . feeding supplement (NEPRO CARB STEADY)  237 mL Oral BID BM  . guaiFENesin  600 mg Oral BID  . insulin aspart  0-5 Units Subcutaneous QHS  . insulin aspart  0-9 Units Subcutaneous TID WC  . insulin glargine  25 Units Subcutaneous QHS  . isosorbide-hydrALAZINE  1 tablet Oral BID   . OxyCODONE  10 mg Oral Q12H  . pantoprazole  40 mg Oral Daily  . polyethylene glycol  17 g Oral BID  . spironolactone  25 mg Oral Daily  . traZODone  50 mg Oral QHS  . warfarin  4 mg Oral ONCE-1800  . Warfarin - Pharmacist Dosing Inpatient   Does not apply q1800   Continuous Infusions: . DOPamine 2 mcg/kg/min (01/13/13 9811)  . furosemide (LASIX) infusion 10 mg/hr (01/12/13 1816)     ELLIS,ALLISON L.  [facility] Pager [phone]. If 8PM-8AM, please contact night-coverage at [email or web address], password [facility] 01/13/2013, 11:15 AM  LOS: 11 days  I have personally examined this patient and reviewed the entire database. I have reviewed the above note, made any necessary editorial changes, and agree with its content.  Lonia Blood, MD [facility]

## 2013-01-13 NOTE — Progress Notes (Signed)
PT has refused bipap machine at this time. Pt is currently on 3 lpm nasal cannula with o2 sats 100%, no distress. RT will assist as needed.

## 2013-01-13 NOTE — Progress Notes (Signed)
Advanced Heart Failure Rounding Note   Subjective:    Ms. Gruner is an 79 y.o. female with history of chronic atrial fibrillation on coumadin, diastolic heart failure and HTN. She also has DM type 2, COPD, OSA on CPAP and renal failure with baseline Cr 1.5-1.7. She has had 4 hospitalizations in the last 6 months, 1 included tibia/fibula fracture and the rest have been due to massive fluid overload in the setting of diastolic heart failure. She has required short term dialysis in the past for renal failure.   Echo with RV dysfunction and low urine output therefore started on milrinone 0.25 mcg/kg/min on 3/5. Had poor urine output so low dose dopamine added 3/7.   Remains on lasix gtt and low dose dopamine. Diuresis has picked up again. Weight down 10 pounds overnight. (now down 33 pounds)  Feels much better. Renal function stable.   CVP checked personally 19-20.  AF rate 90-100s on dopa, amio 200 mg BID and carvedilol 12.5 mg BID.  Objective:    Vital Signs:   Temp:  [97.4 F (36.3 C)-98.9 F (37.2 C)] 98.1 F (36.7 C) (03/12 0400) Pulse Rate:  [72-104] 94 (03/12 0600) Resp:  [10-24] 15 (03/12 0600) BP: (87-126)/(47-84) 120/71 mmHg (03/12 0600) SpO2:  [91 %-100 %] 100 % (03/12 0600) Weight:  [103.7 kg (228 lb 9.9 oz)] 103.7 kg (228 lb 9.9 oz) (03/12 0400) Last BM Date: 01/12/13 (small soft formed)  Weight change: Filed Weights   01/11/13 0409 01/12/13 0500 01/13/13 0400  Weight: 111 kg (244 lb 11.4 oz) 108.2 kg (238 lb 8.6 oz) 103.7 kg (228 lb 9.9 oz)    Intake/Output:   Intake/Output Summary (Last 24 hours) at 01/13/13 0658 Last data filed at 01/13/13 0643  Gross per 24 hour  Intake 1055.6 ml  Output   8085 ml  Net -7029.4 ml     Physical Exam: General: Chronically ill appearing. No resp difficulty on n/c  HEENT: normal  Neck: supple. JVP jaw . Carotids 2+ bilat; no bruits. No lymphadenopathy or thryomegaly appreciated.  Cor: Distant. Irregular. No rubs, gallops or  murmurs.  Lungs: decreased throughout  Abdomen: obese, soft, nontender, distention much improved. Good bowel sounds.  Extremities: no cyanosis, clubbing, rash, tr-1+ edema. Well healing scar left proximal tibia  Neuro: alert & orientedx3, cranial nerves grossly intact. moves all 4 extremities w/o difficulty. Affect pleasant   Telemetry: atrial fib 90-110  Labs: Basic Metabolic Panel:  Recent Labs Lab 01/07/13 0615  01/09/13 0645 01/10/13 0500 01/11/13 0500 01/11/13 0810 01/12/13 0500 01/13/13 0500  NA 140  < > 137 134* 134*  --  134* 137  K 3.5  < > 3.5 3.4* 3.4*  --  3.8 3.4*  CL 90*  < > 86* 85* 83*  --  84* 84*  CO2 44*  < > >45* >45* >45*  --  45* 45*  GLUCOSE 126*  < > 204* 145* 171*  --  139* 179*  BUN 47*  < > 50* 52* 52*  --  54* 56*  CREATININE 1.69*  < > 1.87* 2.08* 2.10*  --  2.12* 2.14*  CALCIUM 9.7  < > 9.9 9.8 9.9  --  10.2 10.0  MG 1.8  --   --   --  2.0 1.9  --   --   < > = values in this interval not displayed.  Liver Function Tests: No results found for this basename: AST, ALT, ALKPHOS, BILITOT, PROT, ALBUMIN,  in the last  168 hours No results found for this basename: LIPASE, AMYLASE,  in the last 168 hours No results found for this basename: AMMONIA,  in the last 168 hours  CBC:  Recent Labs Lab 01/10/13 0500 01/11/13 0500 01/13/13 0500  WBC 6.2  --  5.1  HGB 9.0* 9.3* 9.3*  HCT 28.5* 29.5* 29.5*  MCV 87.2  --  86.0  PLT 90*  --  112*    Cardiac Enzymes:  Recent Labs Lab 01/08/13 2348 01/09/13 0548 01/09/13 1510  TROPONINI <0.30 <0.30 <0.30    BNP: BNP (last 3 results)  Recent Labs  01/03/13 0800  PROBNP 4706.0*      Imaging: No results found.   Medications:     Scheduled Medications: . acetaZOLAMIDE  500 mg Oral BID  . allopurinol  100 mg Oral Daily  . amiodarone  200 mg Oral BID  . busPIRone  5 mg Oral Q12H  . carvedilol  12.5 mg Oral BID WC  . docusate sodium  100 mg Oral Q8H  . feeding supplement (NEPRO CARB  STEADY)  237 mL Oral BID BM  . guaiFENesin  600 mg Oral BID  . insulin aspart  0-5 Units Subcutaneous QHS  . insulin aspart  0-9 Units Subcutaneous TID WC  . insulin glargine  25 Units Subcutaneous QHS  . isosorbide-hydrALAZINE  1 tablet Oral BID  . OxyCODONE  10 mg Oral Q12H  . pantoprazole  40 mg Oral Daily  . polyethylene glycol  17 g Oral BID  . spironolactone  25 mg Oral Daily  . traZODone  50 mg Oral QHS  . Warfarin - Pharmacist Dosing Inpatient   Does not apply q1800    Infusions: . DOPamine 2 mcg/kg/min (01/13/13 1610)  . furosemide (LASIX) infusion 10 mg/hr (01/12/13 1816)    PRN Medications: acetaminophen, albuterol, alum & mag hydroxide-simeth, diphenhydrAMINE, HYDROcodone-acetaminophen, ipratropium, lactulose, nitroGLYCERIN, ondansetron (ZOFRAN) IV, oxyCODONE, simethicone   Assessment:   1. Acute on chronic right sided heart failure  2. Acute on chronic respiratory failure  3. Acute on chronic renal failure.  4. HTN  5. Obesity, suspect OHS/OSA  6. Chronic atrial flutter  - on coumadin  7. Deconditioning 8. Metabolic alkalosis 9. Hypokalemia 10. Hypotension, resolved  Plan/Discussion:    Diuresis picking up again. Weight coming down very nicely. Closing in on baseline weight (215-220) Will not add back milrinone at this time with recent hypotension.  Cr remains stable around 2.  Continue diamox for now. Given tenuous renal function need to be careful not to overshoot with diuresis too much.   AF is chronic on amio for rate control. Has failed DC-CV in past. Carvedilol cut back due to low BP  PT/OT order in place.   Length of Stay: 11 Daniel Bensimhon,MD 6:58 AM

## 2013-01-13 NOTE — Progress Notes (Signed)
CRITICAL VALUE ALERT  Critical value received:  CO2 45  Date of notification:  01/13/13  Time of notification:  0650  Critical value read back:yes  Nurse who received alert:  Arman Bogus RN  MD notified (1st page): NP Craige Cotta Via Text Page  Time of first page:  0700

## 2013-01-14 LAB — PROTIME-INR
INR: 2.21 — ABNORMAL HIGH (ref 0.00–1.49)
Prothrombin Time: 23.6 seconds — ABNORMAL HIGH (ref 11.6–15.2)

## 2013-01-14 LAB — BASIC METABOLIC PANEL
Calcium: 10.3 mg/dL (ref 8.4–10.5)
Chloride: 84 mEq/L — ABNORMAL LOW (ref 96–112)
Creatinine, Ser: 2.2 mg/dL — ABNORMAL HIGH (ref 0.50–1.10)
GFR calc Af Amer: 23 mL/min — ABNORMAL LOW (ref >90)
GFR calc non Af Amer: 20 mL/min — ABNORMAL LOW (ref >90)

## 2013-01-14 LAB — GLUCOSE, CAPILLARY
Glucose-Capillary: 102 mg/dL — ABNORMAL HIGH (ref 70–99)
Glucose-Capillary: 141 mg/dL — ABNORMAL HIGH (ref 70–99)

## 2013-01-14 MED ORDER — SODIUM CHLORIDE 0.9 % IV BOLUS (SEPSIS)
250.0000 mL | Freq: Once | INTRAVENOUS | Status: AC
  Administered 2013-01-14: 250 mL via INTRAVENOUS

## 2013-01-14 MED ORDER — WARFARIN SODIUM 3 MG PO TABS
3.0000 mg | ORAL_TABLET | Freq: Every day | ORAL | Status: DC
  Administered 2013-01-14 – 2013-01-22 (×9): 3 mg via ORAL
  Filled 2013-01-14 (×10): qty 1

## 2013-01-14 MED ORDER — POTASSIUM CHLORIDE CRYS ER 20 MEQ PO TBCR
40.0000 meq | EXTENDED_RELEASE_TABLET | Freq: Two times a day (BID) | ORAL | Status: AC
  Administered 2013-01-14 – 2013-01-15 (×3): 40 meq via ORAL
  Filled 2013-01-14 (×3): qty 2

## 2013-01-14 NOTE — Progress Notes (Signed)
Pt to transfer to 4704 via bed, O2, IV. Pt VS stable at time of transfer. Novolog given before transfer. Meds in chart. Belongings and family at bedside. MD Rizwan called to place order for continuance of foley to monitor I&Os. No current questions or complaints at this time. Gina Ashley

## 2013-01-14 NOTE — Progress Notes (Signed)
Report called to 4700 RN, Silvana Newness. Using lift to get pt back to bed. Dulcemaria Bula L

## 2013-01-14 NOTE — Progress Notes (Signed)
ANTICOAGULATION CONSULT NOTE - Follow Up Consult  Pharmacy Consult for warfarin Indication: atrial fibrillation  Patient Measurements: Height: 5\' 3"  (160 cm) Weight: 215 lb (97.523 kg) IBW/kg (Calculated) : 52.4   Vital Signs: Temp: 97.8 F (36.6 C) (03/13 0736) Temp src: Oral (03/13 0736) BP: 110/67 mmHg (03/13 0736) Pulse Rate: 99 (03/13 0736)  Labs:  Recent Labs  01/12/13 0500 01/13/13 0500 01/14/13 0500  HGB  --  9.3*  --   HCT  --  29.5*  --   PLT  --  112*  --   LABPROT 24.0* 22.2* 23.6*  INR 2.26* 2.04* 2.21*  CREATININE 2.12* 2.14* 2.20*    Estimated Creatinine Clearance: 21.5 ml/min (by C-G formula based on Cr of 2.2).   Assessment: 79 y/o F who continues of warfarin for Afib. INR now trending up to 2.2, no bleeding issues noted. CBC is low and essentially stable. Will transition back to home dose of 3mg  daily today.   Dopamine and lasix to be discontinued today.   Warfarin home dose is 3mg  daily  Goal of Therapy:  INR 2-3 Monitor platelets by anticoagulation protocol: Yes   Plan:  - Warfarin 3mg  today - Daily PT/INR, CBC in am-will change to mwf INR is stable in am 3/14 - Monitor for signs and symptoms of bleeding  Sheppard Coil, PharmD Clinical Pharmacist Pager: [phone] Pharmacy: [phone] 01/14/2013 8:28 AM

## 2013-01-14 NOTE — Progress Notes (Signed)
Received report from nurse on 2600, patient has arrived to unit and vital signs are stable; will continue to monitor patient. Lorretta Harp RN

## 2013-01-14 NOTE — Progress Notes (Signed)
Clinical Social Worker submitted updated progress notes to [facility].  CSW to continue to follow and assist as needed.   Angelia Mould, MSW, [place] [phone]

## 2013-01-14 NOTE — Progress Notes (Signed)
CRITICAL VALUE ALERT  Critical value received:  CO2-44  Date of notification:  01/14/13  Time of notification:  0724  Critical value read back:yes  Nurse who received alert:  Armida Sans RN  MD notified (1st page):  Rizwan  Time of first page: 0725  MD notified (2nd page):  Time of second page:  Responding MD:  Butler Denmark  Time MD responded:  0730

## 2013-01-14 NOTE — Progress Notes (Addendum)
[facility] PROGRESS NOTE  Interim History: 79 y.o. year-old with hx of morbid obesity, HTN, CKD III, afib, OSA on CPAP and DM2 x 10-15 years presenting with marked anasarca to ED. She has been either in hospitals or SNF"s or LTAC's for the last 6 months. She fell and broke her R femur and L lower leg requiring surgical repair during this time. She has gotten temporary dialysis during this time, details not available. Creat is 1.6. She says swelling of abdomen and legs has been there for 7 days. Denies SOB, n/v/d, CP or cough, orthopnea. No fam hx of kidney disease. No NSAID"S. Non ambulatory.  The CHF Team has been actively following this patient.  She diuresed well with Dopamine, milrinone, lasix, metolazone and diamox therapies. Lasix gtt and Dopamine dc'd 3/13.  Assessment/Plan:  Acute right heart congestive heart failure/ Acute on chronic kidney disease stage 3, cardio-renal syndrome: - diuresis per CHF Team - continue to monitor electrolytes and replete - On admission 117.5kg now down to 97.5kg.  -baseline Scr (09/15/12) was 1.14- ? If has new baseline now -GFR now in CKD stage 4 range -VERY brittle DHF and would like to monitor until Monday since PO meds just resumed -cont. 1000cc FR-consider dc foley 3/14 after see how does with mobilization Off Lasix gtt and Dopamine/dc CVP readings  PAF (paroxysmal atrial fibrillation), was in SR 06/2012 : - will differ to cards/rate controlled - Coumadin per pharmacy -INR therapeutic -failed DCCV in past  Hypokalemia Replaced-recheck in a.m.  Physical deconditioning -multiple hospitalizations over past several months with most recent location an LTAC prior to this admit -PT/OT following -plan dc to [facility] SNF  DM with complications (CKD), controlled : - Hemoglobin A1c on this admission 6.8  - Continue sliding scale insulin & Lantus    Current hypotension with history of HYPERTENSION  - cont. Bidil, coreg (dose lowered this  admit 2/2 hypotension) -holding parameters orders  Depression: - Continue buspirone  Obesity (BMI 30-39.9) - counseling.  Anemia due to chronic illness - Hbg stable. - cbc in am  OBSTRUCTIVE SLEEP APNEA - c-pap -c/o ear fullness/ pain and post nasal drip so begin Afrin intranasal  Code Status: partial code blue - no CPR or intubation but OK with ant-arrhythmic meds, defib/cardiovert and vasoactive drugs Family Communication: no family at bedside  Disposition Plan: SDU; home or SNF when stable  DVT prophylaxis:  warfarin  Consultants:  HF Team/Benshimon  Procedures: 2-D echocardiogram (01/04/13) Study Conclusions - Left ventricle: The cavity size was normal. There was moderate concentric hypertrophy. Systolic function was normal. The estimated ejection fraction was in the range of 50% to 55%. LV diastolic function cannot be assessed due to atrial fibrillation. - Aortic valve: Mildly calcified leaflets with reduced leaflet excursion. Transvalvular velocity was minimally increased. - Mitral valve: Heavily calcified posterior annulus. - Left atrium: Severely dilated (28 cm2). - Right atrium: Moderately dilated. - Tricuspid valve: Mild regurgitation. - Pulmonary arteries: PA peak pressure: 35mm Hg (S). - Pericardium, extracardiac: There was no pericardial effusion.  Antibiotics:  none  HPI/Subjective: Fells great-has not been OOB yet. No SOB/CP.  Objective: Filed Vitals:   01/14/13 1100 01/14/13 1130 01/14/13 1147 01/14/13 1200  BP: 77/47 84/46 96/54  94/53  Pulse: 87 102 90 86  Temp:      TempSrc:   Oral   Resp:   14   Height:      Weight:      SpO2: 99% 99% 99% 96%    Intake/Output  Summary (Last 24 hours) at 01/14/13 1214 Last data filed at 01/14/13 1122  Gross per 24 hour  Intake  811.6 ml  Output   7000 ml  Net -6188.4 ml   Filed Weights   01/13/13 0400 01/13/13 2327 01/14/13 0736  Weight: 103.7 kg (228 lb 9.9 oz) 104.4 kg (230 lb 2.6 oz) 97.523  kg (215 lb)    Exam:  General: Alert, awake, oriented x3, in no acute distress.  HEENT: No bruits, no goiter Heart: Irregular rate and atrial fib rhythm, without murmurs, rubs, gallops - 1+ bilateral LE edema slightly more swelling right foot Lungs: Basilar crackles but o/w clear, 4 L oxygen, no tachypnea Abdomen: Soft, nontender,  positive bowel sounds - Anasarca - obese Musculoskeletal: Extremities symmetrical without any acute trauma or erythema, no clubbing Neurological: Alert and oriented x3, moves all extremities x4, cranial nerves II through XII grossly intact  Data Reviewed: Basic Metabolic Panel:  Recent Labs Lab 01/10/13 0500 01/11/13 0500 01/11/13 0810 01/12/13 0500 01/13/13 0500 01/14/13 0500  NA 134* 134*  --  134* 137 138  K 3.4* 3.4*  --  3.8 3.4* 3.0*  CL 85* 83*  --  84* 84* 84*  CO2 >45* >45*  --  45* 45* 44*  GLUCOSE 145* 171*  --  139* 179* 98  BUN 52* 52*  --  54* 56* 56*  CREATININE 2.08* 2.10*  --  2.12* 2.14* 2.20*  CALCIUM 9.8 9.9  --  10.2 10.0 10.3  MG  --  2.0 1.9  --   --   --     CBC:  Recent Labs Lab 01/10/13 0500 01/11/13 0500 01/13/13 0500  WBC 6.2  --  5.1  HGB 9.0* 9.3* 9.3*  HCT 28.5* 29.5* 29.5*  MCV 87.2  --  86.0  PLT 90*  --  112*   Cardiac Enzymes:  Recent Labs Lab 01/08/13 2348 01/09/13 0548 01/09/13 1510  TROPONINI <0.30 <0.30 <0.30   BNP (last 3 results)  Recent Labs  01/03/13 0800  PROBNP 4706.0*   CBG:  Recent Labs Lab 01/13/13 1243 01/13/13 1735 01/13/13 2024 01/14/13 0739 01/14/13 1148  GLUCAP 147* 176* 160* 102* 137*    Studies: No results found.  Scheduled Meds: . acetaZOLAMIDE  500 mg Oral BID  . allopurinol  100 mg Oral Daily  . amiodarone  200 mg Oral BID  . busPIRone  5 mg Oral Q12H  . carvedilol  12.5 mg Oral BID WC  . docusate sodium  100 mg Oral Q8H  . feeding supplement (NEPRO CARB STEADY)  237 mL Oral BID BM  . guaiFENesin  600 mg Oral BID  . insulin aspart  0-5 Units  Subcutaneous QHS  . insulin aspart  0-9 Units Subcutaneous TID WC  . insulin glargine  25 Units Subcutaneous QHS  . isosorbide-hydrALAZINE  1 tablet Oral BID  . OxyCODONE  10 mg Oral Q12H  . pantoprazole  40 mg Oral Daily  . polyethylene glycol  17 g Oral BID  . potassium chloride  40 mEq Oral BID  . spironolactone  25 mg Oral Daily  . traZODone  50 mg Oral QHS  . warfarin  3 mg Oral q1800  . Warfarin - Pharmacist Dosing Inpatient   Does not apply q1800   Continuous Infusions:     [facility]AN[facility]  [facility] Pager [phone]. If 8PM-8AM, please contact night-coverage at [email or web address], password [facility] 01/14/2013, 12:14 PM  LOS: 12 days   I have examined the  patient, reviewed the chart and modified the above note which I agree with.   RIZWAN,SAIMA,MD [phone] 01/14/2013, 5:25 PM

## 2013-01-14 NOTE — Progress Notes (Addendum)
Advanced Heart Failure Rounding Note   Subjective:    Gina Ashley is an 79 y.o. female with history of chronic atrial fibrillation on coumadin, diastolic heart failure and HTN. She also has DM type 2, COPD, OSA on CPAP and renal failure with baseline Cr 1.5-1.7. She has had 4 hospitalizations in the last 6 months, 1 included tibia/fibula fracture and the rest have been due to massive fluid overload in the setting of diastolic heart failure. She has required short term dialysis in the past for renal failure.   Echo with RV dysfunction and low urine output therefore started on milrinone 0.25 mcg/kg/min on 3/5. Had poor urine output so low dose dopamine added 3/7.   Remains on lasix gtt and low dose dopamine. 24 hour I/O -2.4 liters.  Weight 228>215 pounds. .   Denies SOB/PND/Orthopnea. Feels much better.  2.14>2.2  CVP 7-8 checked personally  AF rate 90-100s on dopa, amio 200 mg BID and carvedilol 12.5 mg BID.  Objective:    Vital Signs:   Temp:  [97.5 F (36.4 C)-98.9 F (37.2 C)] 97.8 F (36.6 C) (03/13 0736) Pulse Rate:  [52-107] 99 (03/13 0736) Resp:  [10-22] 12 (03/13 0736) BP: (92-130)/(49-82) 110/67 mmHg (03/13 0736) SpO2:  [91 %-100 %] 100 % (03/13 0736) Weight:  [97.523 kg (215 lb)-104.4 kg (230 lb 2.6 oz)] 97.523 kg (215 lb) (03/13 0736) Last BM Date: 01/13/13  Weight change: Filed Weights   01/13/13 0400 01/13/13 2327 01/14/13 0736  Weight: 103.7 kg (228 lb 9.9 oz) 104.4 kg (230 lb 2.6 oz) 97.523 kg (215 lb)    Intake/Output:   Intake/Output Summary (Last 24 hours) at 01/14/13 0814 Last data filed at 01/14/13 0738  Gross per 24 hour  Intake  292.8 ml  Output   7000 ml  Net -6707.2 ml     Physical Exam: CVP 7-8 General: Chronically ill appearing. No resp difficulty on n/c  HEENT: normal  Neck: supple. JVP 7-8. Carotids 2+ bilat; no bruits. No lymphadenopathy or thryomegaly appreciated.  Cor: Distant. Irregular. No rubs, gallops or murmurs.  Lungs:  decreased throughout  Abdomen: obese, soft, nontender, distention much improved. Good bowel sounds.  Extremities: no cyanosis, clubbing, rash, tr- edema. Well healing scar left proximal tibia RUE PICC Neuro: alert & orientedx3, cranial nerves grossly intact. moves all 4 extremities w/o difficulty. Affect pleasant   Telemetry: atrial fib 90-110  Labs: Basic Metabolic Panel:  Recent Labs Lab 01/10/13 0500 01/11/13 0500 01/11/13 0810 01/12/13 0500 01/13/13 0500 01/14/13 0500  NA 134* 134*  --  134* 137 138  K 3.4* 3.4*  --  3.8 3.4* 3.0*  CL 85* 83*  --  84* 84* 84*  CO2 >45* >45*  --  45* 45* 44*  GLUCOSE 145* 171*  --  139* 179* 98  BUN 52* 52*  --  54* 56* 56*  CREATININE 2.08* 2.10*  --  2.12* 2.14* 2.20*  CALCIUM 9.8 9.9  --  10.2 10.0 10.3  MG  --  2.0 1.9  --   --   --     Liver Function Tests: No results found for this basename: AST, ALT, ALKPHOS, BILITOT, PROT, ALBUMIN,  in the last 168 hours No results found for this basename: LIPASE, AMYLASE,  in the last 168 hours No results found for this basename: AMMONIA,  in the last 168 hours  CBC:  Recent Labs Lab 01/10/13 0500 01/11/13 0500 01/13/13 0500  WBC 6.2  --  5.1  HGB 9.0*  9.3* 9.3*  HCT 28.5* 29.5* 29.5*  MCV 87.2  --  86.0  PLT 90*  --  112*    Cardiac Enzymes:  Recent Labs Lab 01/08/13 2348 01/09/13 0548 01/09/13 1510  TROPONINI <0.30 <0.30 <0.30    BNP: BNP (last 3 results)  Recent Labs  01/03/13 0800  PROBNP 4706.0*      Imaging: No results found.   Medications:     Scheduled Medications: . acetaZOLAMIDE  500 mg Oral BID  . allopurinol  100 mg Oral Daily  . amiodarone  200 mg Oral BID  . busPIRone  5 mg Oral Q12H  . carvedilol  12.5 mg Oral BID WC  . docusate sodium  100 mg Oral Q8H  . feeding supplement (NEPRO CARB STEADY)  237 mL Oral BID BM  . guaiFENesin  600 mg Oral BID  . insulin aspart  0-5 Units Subcutaneous QHS  . insulin aspart  0-9 Units Subcutaneous TID WC   . insulin glargine  25 Units Subcutaneous QHS  . isosorbide-hydrALAZINE  1 tablet Oral BID  . OxyCODONE  10 mg Oral Q12H  . pantoprazole  40 mg Oral Daily  . polyethylene glycol  17 g Oral BID  . spironolactone  25 mg Oral Daily  . traZODone  50 mg Oral QHS  . warfarin  3 mg Oral q1800  . Warfarin - Pharmacist Dosing Inpatient   Does not apply q1800    Infusions:    PRN Medications: acetaminophen, albuterol, alum & mag hydroxide-simeth, diphenhydrAMINE, lactulose, nitroGLYCERIN, ondansetron (ZOFRAN) IV, oxyCODONE, simethicone   Assessment:   1. Acute on chronic right sided heart failure  2. Acute on chronic respiratory failure  3. Acute on chronic renal failure.  4. HTN  5. Obesity, suspect OHS/OSA  6. Chronic atrial flutter  - on coumadin  7. Deconditioning 8. Metabolic alkalosis 9. Hypokalemia 10. Hypotension, resolved  Plan/Discussion:    Weight down 13 pounds over night. At her baseline 215 pounds. CVP 7-8. Stop lasix drip/dopamine.  Continue diamox for now. Restart Torsemide 40 mg daily tomorrow. Add ted hose.   AF is chronic on amio for rate control. Has failed DC-CV in past. Carvedilol cut back due to low BP  PT/OT order in place.  She wants to go to [facility]. Likely OK to go tomorrow.    Length of Stay: 12 Daniel Bensimhon,MD 8:14 AM

## 2013-01-15 LAB — BASIC METABOLIC PANEL
GFR calc Af Amer: 19 mL/min — ABNORMAL LOW (ref >90)
GFR calc non Af Amer: 17 mL/min — ABNORMAL LOW (ref >90)
Potassium: 3.7 mEq/L (ref 3.5–5.1)
Sodium: 137 mEq/L (ref 135–145)

## 2013-01-15 LAB — GLUCOSE, CAPILLARY
Glucose-Capillary: 117 mg/dL — ABNORMAL HIGH (ref 70–99)
Glucose-Capillary: 150 mg/dL — ABNORMAL HIGH (ref 70–99)

## 2013-01-15 LAB — PROTIME-INR
INR: 2.38 — ABNORMAL HIGH (ref 0.00–1.49)
Prothrombin Time: 24.9 seconds — ABNORMAL HIGH (ref 11.6–15.2)

## 2013-01-15 NOTE — Progress Notes (Signed)
CRITICAL VALUE ALERT  Critical value received: CO2 45  Date of notification: 01/15/13  Time of notification: 0643  Critical value read back:yes  Nurse who received alert:  Leanor Kail  MD notified (1st page):  Donnamarie Poag  Time of first page:  [phone]  MD notified (2nd page):K.Kirby  Time of second page:0700  Responding MD:  Still awaiting for reply  Time MD responded: still awaiting for reply

## 2013-01-15 NOTE — Progress Notes (Signed)
Patient transferred to 4700 yesterday and handoff report provided by Angelia Mould, LCSWA.  DC plan is for placement at [facility] when medically stable per MD.  Gina Ashley is on chart.  CSW discussed with Dr. Brien Few- not currently stable for d/c. Will monitor.  Gina Ashley. [name]  [phone]

## 2013-01-15 NOTE — Evaluation (Signed)
Occupational Therapy Evaluation Patient Details Name: Gina Ashley MRN: [id number] DOB: Aug 17, 1927 Today's Date: 01/15/2013 Time: 4098-1191 OT Time Calculation (min): 43 min  OT Assessment / Plan / Recommendation Clinical Impression  This 79 y.o. female admitted from [facility] with Anasarca.  Pt with h/o Rt femur rx and Lt. fibula fx.  She remains NWB on LLE.   Pt presents to OT with the below listed deficits and will benefit from OT to maximize safety and independence with BADLs to allow her to return to min A level after SNF level rehab.  No acute needs identified as pt is likely to discharge to SNF in next 1-3 days.    OT Assessment  All further OT needs can be met in the next venue of care    Follow Up Recommendations  SNF    Barriers to Discharge      Equipment Recommendations  None recommended by OT    Recommendations for Other Services    Frequency       Precautions / Restrictions Precautions Precautions: Fall Required Braces or Orthoses: Knee Immobilizer - Left Knee Immobilizer - Left: On when out of bed or walking Restrictions Weight Bearing Restrictions: Yes LLE Weight Bearing: Non weight bearing       ADL  Eating/Feeding: Independent Where Assessed - Eating/Feeding: Edge of bed Grooming: Wash/dry hands;Wash/dry face;Teeth care;Minimal assistance Where Assessed - Grooming: Unsupported sitting Upper Body Bathing: Set up Where Assessed - Upper Body Bathing: Supine, head of bed up;Supported sitting Lower Body Bathing: +1 Total assistance Where Assessed - Lower Body Bathing: Supine, head of bed up;Rolling right and/or left Upper Body Dressing: Set up Where Assessed - Upper Body Dressing: Supported sitting Lower Body Dressing: +1 Total assistance Where Assessed - Lower Body Dressing: Supine, head of bed up;Rolling right and/or left Toilet Transfer: +1 Total assistance Toilet Transfer Method: Other (comment) (scoot) Equipment Used: Knee Immobilizer ADL Comments: Pt.  performed 10 reps moderately resisted shoulder flex/ext; PNF patterns,, and elbow flex/ext.      OT Diagnosis: Generalized weakness;Acute pain  OT Problem List: Decreased strength;Decreased activity tolerance;Decreased knowledge of use of DME or AE;Obesity;Pain OT Treatment Interventions:     OT Goals    Visit Information  Last OT Received On: 01/15/13 Assistance Needed: +2 (for OOB)    Subjective Data  Subjective: "I get so sore when I lie in bed too long" Patient Stated Goal: To get better   Prior Functioning     Home Living Available Help at Discharge: Skilled Nursing Facility;Available 24 hours/day Type of Home: Skilled Nursing Facility Prior Function Level of Independence: Needs assistance Needs Assistance: Bathing;Dressing;Toileting;Transfers Bath: Moderate Dressing: Moderate Toileting: Moderate Transfer Assistance: was doing both scooting transfers and stand turn with walker per her report until edema, then unable to stand due to large swelling in both legs. Able to Take Stairs?: No Driving: No Communication Communication: No difficulties         Vision/Perception     Cognition  Cognition Overall Cognitive Status: Appears within functional limits for tasks assessed/performed Arousal/Alertness: Awake/alert Orientation Level: Appears intact for tasks assessed Behavior During Session: [place] for tasks performed    Extremity/Trunk Assessment Right Upper Extremity Assessment RUE ROM/Strength/Tone: Deficits RUE ROM/Strength/Tone Deficits: shoulder grossly 4-/4 RUE Sensation: WFL - Light Touch RUE Coordination: WFL - gross/fine motor Left Upper Extremity Assessment LUE ROM/Strength/Tone: Within functional levels LUE Sensation: WFL - Light Touch LUE Coordination: WFL - gross/fine motor Trunk Assessment Trunk Assessment: Normal     Mobility Bed Mobility Bed  Mobility: Supine to Sit;Sitting - Scoot to Edge of Bed;Sit to Supine Supine to Sit: 3: Mod  assist;With rails;Other (comment) (trapeze) Sitting - Scoot to Edge of Bed: 3: Mod assist Sit to Supine: 3: Mod assist;With rail;HOB flat;Other (comment) (with trapeze) Details for Bed Mobility Assistance: cuing for sequence     Exercise     Balance Balance Balance Assessed: Yes Static Sitting Balance Static Sitting - Balance Support: Right upper extremity supported Static Sitting - Level of Assistance: 4: Min assist Static Sitting - Comment/# of Minutes: min a to support Lt. LE.  Sat x 10 mins EOB before fatiguing.  MD in to examine pt while pt sitting EOB   End of Session OT - End of Session Equipment Utilized During Treatment: Left knee immobilizer Activity Tolerance: Patient tolerated treatment well Patient left: in bed;with call bell/phone within reach  GO     Conarpe, Wendi M 01/15/2013, 3:01 PM

## 2013-01-15 NOTE — Progress Notes (Signed)
Patient's urinary catheter has been removed per protocol.  D. Brown RN 

## 2013-01-15 NOTE — Clinical Social Work Placement (Signed)
     Clinical Social Work Department CLINICAL SOCIAL WORK PLACEMENT NOTE 01/15/2013  Patient:  DARIONNA, BANKE  Account Number:  [id number] Admit date:  01/02/2013  Clinical Social Worker:  Genelle Bal, LCSW  Date/time:  01/05/2013 02:50 AM  Clinical Social Work is seeking post-discharge placement for this patient at the following level of care:   SKILLED NURSING   (*CSW will update this form in Epic as items are completed)   01/05/2013  Patient/family provided with [facility] Department of Clinical Social Works list of facilities offering this level of care within the geographic area requested by the patient (or if unable, by the patients family).  01/05/2013  Patient/family informed of their freedom to choose among providers that offer the needed level of care, that participate in Medicare, Medicaid or managed care program needed by the patient, have an available bed and are willing to accept the patient.    Patient/family informed of [facility] ownership interest in [facility], as well as of the fact that they are under no obligation to receive care at this facility.  PASARR submitted to EDS on 08/06/2012 PASARR number received from EDS on 08/06/2012  FL2 transmitted to all facilities in geographic area requested by pt/family on  01/05/2013 FL2 transmitted to all facilities within larger geographic area on 01/05/2013  Patient informed that his/her managed care company has contracts with or will negotiate with  certain facilities, including the following:     Patient/family informed of bed offers received:  01/07/2013 Patient chooses bed at [facility] Physician recommends and patient chooses bed at    Patient to be transferred to  on   Patient to be transferred to facility by [facility])  The following physician request were entered in Epic:   Additional Comments:

## 2013-01-15 NOTE — Progress Notes (Addendum)
Advanced Heart Failure Rounding Note   Subjective:    Gina Ashley is an 79 y.o. female with history of chronic atrial fibrillation on coumadin, diastolic heart failure and HTN. She also has DM type 2, COPD, OSA on CPAP and renal failure with baseline Cr 1.5-1.7. She has had 4 hospitalizations in the last 6 months, 1 included tibia/fibula fracture and the rest have been due to massive fluid overload in the setting of diastolic heart failure. She has required short term dialysis in the past for renal failure.   Echo with RV dysfunction and low urine output therefore started on milrinone 0.25 mcg/kg/min on 3/5. Had poor urine output so low dose dopamine added 3/7.    Overall 47 pound diuresis since admit.  Yesterday lasix drip and dopamine stopped. She became hypotensive yesterday and received 500 cc. BP improved.  24 hour I/O -1.1 liters.  Weight stable.   Denies SOB/PND/Orthopnea. Feels much better.   Creatinine 2.14>2.2>2.52 CO2 44>45    Objective:    Vital Signs:   Temp:  [97.4 F (36.3 C)-97.9 F (36.6 C)] 97.9 F (36.6 C) (03/14 1335) Pulse Rate:  [74-92] 78 (03/14 1335) Resp:  [13-20] 20 (03/14 1335) BP: (89-114)/(47-67) 96/56 mmHg (03/14 1335) SpO2:  [94 %-100 %] 95 % (03/14 1335) Weight:  [97.9 kg (215 lb 13.3 oz)-98.3 kg (216 lb 11.4 oz)] 97.9 kg (215 lb 13.3 oz) (03/14 0636) Last BM Date: 01/15/13  Weight change: Filed Weights   01/14/13 0736 01/14/13 1740 01/15/13 0636  Weight: 97.523 kg (215 lb) 98.3 kg (216 lb 11.4 oz) 97.9 kg (215 lb 13.3 oz)    Intake/Output:   Intake/Output Summary (Last 24 hours) at 01/15/13 1448 Last data filed at 01/15/13 1344  Gross per 24 hour  Intake   1795 ml  Output    500 ml  Net   1295 ml     Physical Exam: General: Chronically ill appearing in chair. No resp difficulty on n/c  HEENT: normal  Neck: supple. JVP 7-8. Carotids 2+ bilat; no bruits. No lymphadenopathy or thryomegaly appreciated.  Cor: Distant. Irregular. No  rubs, gallops or murmurs.  Lungs: decreased throughout  Abdomen: obese, soft, nontender, distention much improved. Good bowel sounds.  Extremities: no cyanosis, clubbing, rash, tr- edema. LLE brace. Well healing scar left proximal tibia RUE PICC Neuro: alert & orientedx3, cranial nerves grossly intact. moves all 4 extremities w/o difficulty. Affect pleasant   Telemetry: atrial fib 90-110  Labs: Basic Metabolic Panel:  Recent Labs Lab 01/11/13 0500 01/11/13 0810 01/12/13 0500 01/13/13 0500 01/14/13 0500 01/15/13 0540  NA 134*  --  134* 137 138 137  K 3.4*  --  3.8 3.4* 3.0* 3.7  CL 83*  --  84* 84* 84* 85*  CO2 >45*  --  45* 45* 44* 45*  GLUCOSE 171*  --  139* 179* 98 116*  BUN 52*  --  54* 56* 56* 59*  CREATININE 2.10*  --  2.12* 2.14* 2.20* 2.52*  CALCIUM 9.9  --  10.2 10.0 10.3 9.8  MG 2.0 1.9  --   --   --   --     Liver Function Tests: No results found for this basename: AST, ALT, ALKPHOS, BILITOT, PROT, ALBUMIN,  in the last 168 hours No results found for this basename: LIPASE, AMYLASE,  in the last 168 hours No results found for this basename: AMMONIA,  in the last 168 hours  CBC:  Recent Labs Lab 01/10/13 0500 01/11/13 0500 01/13/13  0500  WBC 6.2  --  5.1  HGB 9.0* 9.3* 9.3*  HCT 28.5* 29.5* 29.5*  MCV 87.2  --  86.0  PLT 90*  --  112*    Cardiac Enzymes:  Recent Labs Lab 01/08/13 2348 01/09/13 0548 01/09/13 1510  TROPONINI <0.30 <0.30 <0.30    BNP: BNP (last 3 results)  Recent Labs  01/03/13 0800  PROBNP 4706.0*      Imaging: No results found.   Medications:     Scheduled Medications: . acetaZOLAMIDE  500 mg Oral BID  . allopurinol  100 mg Oral Daily  . amiodarone  200 mg Oral BID  . busPIRone  5 mg Oral Q12H  . carvedilol  12.5 mg Oral BID WC  . docusate sodium  100 mg Oral Q8H  . feeding supplement (NEPRO CARB STEADY)  237 mL Oral BID BM  . guaiFENesin  600 mg Oral BID  . insulin aspart  0-5 Units Subcutaneous QHS  .  insulin aspart  0-9 Units Subcutaneous TID WC  . insulin glargine  25 Units Subcutaneous QHS  . isosorbide-hydrALAZINE  1 tablet Oral BID  . OxyCODONE  10 mg Oral Q12H  . pantoprazole  40 mg Oral Daily  . polyethylene glycol  17 g Oral BID  . traZODone  50 mg Oral QHS  . warfarin  3 mg Oral q1800  . Warfarin - Pharmacist Dosing Inpatient   Does not apply q1800    Infusions:    PRN Medications: acetaminophen, albuterol, alum & mag hydroxide-simeth, diphenhydrAMINE, lactulose, nitroGLYCERIN, ondansetron (ZOFRAN) IV, oxyCODONE, simethicone   Assessment:   1. Acute on chronic right sided heart failure  2. Acute on chronic respiratory failure  3. Acute on chronic renal failure.  4. HTN  5. Obesity, suspect OHS/OSA  6. Chronic atrial flutter  - on coumadin  7. Deconditioning 8. Metabolic alkalosis 9. Hypokalemia 10. Hypotension, resolved  Plan/Discussion:    Weight unchanged.  Now at her baseline 215 pounds.  Renal function trending up slowly. Would continue to hold diuretics at least one more day. Will stop spironolactone. Continue diamox. Once renal function plateaus would resume torsemide at 40mg  daily to start. Can titrate up as needed.  AF is chronic on amio for rate control. Has failed DC-CV in past. Carvedilol cut back due to low BP  PT/OT following.    Length of Stay: 13 Daniel Bensimhon,MD 2:50 PM

## 2013-01-15 NOTE — Progress Notes (Signed)
[facility] PROGRESS NOTE  Gina Ashley AVW:[id number] DOB: 12-13-1926 DOA: 01/02/2013 PCP: Ernestine Conrad, MD  Assessment/Plan: Principal Problem:   Shortness of breath Active Problems:   DM   OBSTRUCTIVE SLEEP APNEA   HYPERTENSION   GERD   PAF (paroxysmal atrial fibrillation), was in SR 06/2012    COPD (chronic obstructive pulmonary disease)   Anemia due to chronic illness   CKD (chronic kidney disease)   Chronic diastolic heart failure   Obesity (BMI 30-39.9)   Depression   Acute systolic congestive heart failure   Cardiorenal syndrome with renal failure   Hypokalemia   Acute on chronic diastolic heart failure    1. Acute right heart congestive heart failure: Patient presented with worsening edema, increasing abdominal girth and SOB, despite iv Lasix. Clinically, she was found to be anasarcic and felt to have acute on chronic right sided HF and marked volume overload on exam. Dr Marca Ancona provided cardiology consultation, reviewed 2D echocardiogram, which showed poorly visualized moderately dilated RV with moderately decreased systolic function and mildly left-shifted IV septum. Patient was initially managed with ivi Milrinone, as well as aggressive diuresis. Dr Reuel Boom Bensimhon/Heart Failure team followed subsequently, and were extremely helpful in rationalizing heart failure treatment. Clinically, patient has responded satisfactorily to treatment, and as of 01/15/13, is feeling considerably better. Diuretics were discontinued on 01/14/13, due to hypotension, and patient administered a bolus of NS.  2. Acute on chronic kidney disease stage 3, cardio-renal syndrome: Patient's baseline creatinine was 1.14 on 09/15/12. Creatinine was 1.65 at presentation, and gradually climbed, reaching a high of 2.20 on 01/14/13. GFR now in CKD stage 4 range. In the initial few days of hospitalization, she was managed with iviv low dose Dopamine. This was discontinued on 01/14/13. Following renal  indices. Nephrology consultation was provided by Dr Delano Metz. Patient is considered a poor candidate for RRT, due to her co-morbidities. 3. PAF (paroxysmal atrial fibrillation), was in SR 06/2012: Patient has a known history of chronic atrial fibrillation/flutter and has failed DCCV in the past. Managed with Coreg and Amiodarone, with satisfactory rate control. On Coumadin per pharmacy -INR therapeutic  4. Hypokalemia: Likely due to diuretics. Repleted as indicated. Following Lytes.   Replaced-recheck in a.m.  5. DM with complications (CKD), controlled: Hemoglobin A1c on this admission 6.8. Managing with diet, SSI and Lantus, with reasonable control.   6. HTN: Patient has a known history of HTN, but during this hospitalization, BP has been borderline to low. On Bidil and Coreg (dose lowered this admit, secondary to hypotension), with holding parameters orders. Patient appears to be tolerating borderline BP well.   7. Depression: Stable on Buspirone.  8. Anemia: Patient has a mild normocytic anemia, due to chronic illness. HB has remained stable/reasonable.  9. OSA: Known history of OSA/OHS: Managing with nocturnal CPAP, and appear stable from this view point as of 01/15/13, although as discussed in #1, she presented with right heart failure. 10. Physical deconditioning: Patient is significantly deconditioned, due to multiple co-morbidites, multiple hospitalizations over past several months and recent acute illness. PT/OT following. Plan to [facility], when stable.       Code Status: Limited code blue. No CPR or intubation but OK with anti-arrhythmic meds, defib/cardiovert and vasoactive drugs.  Family Communication:  Disposition Plan: To be determined.    Brief narrative: 79 y.o. female with history of GERD, chronic back pain, Hiatal hernia, HTN, CKD stage III; Atrial fibrillation, OSA on CPAP, Depression, Chronic diastolic heart failure (08/31/2012), Tibia/Fibula fracture  s/p surgical  repair with post-op course complicated by ARF, requiring temporary HD, COPD, Obesity Hypoventilation syndrome, chronic respiratory failure. Following her last hospitalization, she was discharged to [facility] and has been living there since then. Family states that 1 month ago she had her perma-cath was discontinued. For the past 6 days, she has had worsening edema with increasing abdominal girth and SOB, despite iv Lasix. She was sent to [facility] ED for further treatment, and was admitted.   Consultants:  Dr Arvilla Meres, cardiologist.  Dr dalton Shirlee Latch, cardiologist.   Dr Johny Sax, ID.  Dr Delano Metz, nephrologist.   Procedures:  CXRs  2D Echocardiogram     Antibiotics:  N/A.   HPI/Subjective: Feeling quite well today.  Objective: Vital signs in last 24 hours: Temp:  [97.4 F (36.3 C)-97.9 F (36.6 C)] 97.4 F (36.3 C) (03/14 0636) Pulse Rate:  [65-102] 86 (03/14 0636) Resp:  [9-18] 18 (03/14 0636) BP: (77-114)/(46-67) 102/54 mmHg (03/14 0636) SpO2:  [95 %-100 %] 97 % (03/14 0636) Weight:  [97.523 kg (215 lb)-98.3 kg (216 lb 11.4 oz)] 97.9 kg (215 lb 13.3 oz) (03/14 0636) Weight change: -6.877 kg (-15 lb 2.6 oz) Last BM Date: 01/15/13  Intake/Output from previous day: 03/13 0701 - 03/14 0700 In: 1809.4 [P.O.:720; I.V.:14.4; IV Piggyback:500] Out: 2950 [Urine:2950]     Physical Exam: General: Comfortable, alert, communicative, fully oriented, not short of breath at rest.  HEENT:  Mild clinical pallor, no jaundice, no conjunctival injection or discharge. NECK:  Supple, JVP not seen, no carotid bruits, no palpable lymphadenopathy, no palpable goiter. CHEST:  Clinically clear to auscultation, no wheezes, no crackles. HEART:  Sounds 1 and 2 heard, normal, regular, no murmurs. ABDOMEN:  Obese, soft, non-tender, no palpable organomegaly, no palpable masses, normal bowel sounds. GENITALIA:  Not examined. LOWER EXTREMITIES:  Moderate pitting edema RLE.  LLE is in orthopedic support/Immobilizer.  MUSCULOSKELETAL SYSTEM:  Generalized osteoarthritic changes, otherwise, normal. CENTRAL NERVOUS SYSTEM:  No focal neurologic deficit on gross examination.  Lab Results:  Recent Labs  01/13/13 0500  WBC 5.1  HGB 9.3*  HCT 29.5*  PLT 112*    Recent Labs  01/14/13 0500 01/15/13 0540  NA 138 137  K 3.0* 3.7  CL 84* 85*  CO2 44* 45*  GLUCOSE 98 116*  BUN 56* 59*  CREATININE 2.20* 2.52*  CALCIUM 10.3 9.8   No results found for this or any previous visit (from the past 240 hour(s)).   Studies/Results: No results found.  Medications: Scheduled Meds: . acetaZOLAMIDE  500 mg Oral BID  . allopurinol  100 mg Oral Daily  . amiodarone  200 mg Oral BID  . busPIRone  5 mg Oral Q12H  . carvedilol  12.5 mg Oral BID WC  . docusate sodium  100 mg Oral Q8H  . feeding supplement (NEPRO CARB STEADY)  237 mL Oral BID BM  . guaiFENesin  600 mg Oral BID  . insulin aspart  0-5 Units Subcutaneous QHS  . insulin aspart  0-9 Units Subcutaneous TID WC  . insulin glargine  25 Units Subcutaneous QHS  . isosorbide-hydrALAZINE  1 tablet Oral BID  . OxyCODONE  10 mg Oral Q12H  . pantoprazole  40 mg Oral Daily  . polyethylene glycol  17 g Oral BID  . potassium chloride  40 mEq Oral BID  . spironolactone  25 mg Oral Daily  . traZODone  50 mg Oral QHS  . warfarin  3 mg Oral q1800  .  Warfarin - Pharmacist Dosing Inpatient   Does not apply q1800   Continuous Infusions:  PRN Meds:.acetaminophen, albuterol, alum & mag hydroxide-simeth, diphenhydrAMINE, lactulose, nitroGLYCERIN, ondansetron (ZOFRAN) IV, oxyCODONE, simethicone    LOS: 13 days   OTI,CHRISTOPHER  [facility] Pager [phone]. If 8PM-8AM, please contact night-coverage at [email or web address], password [facility] 01/15/2013, 7:31 AM  LOS: 13 days

## 2013-01-15 NOTE — Progress Notes (Signed)
ANTICOAGULATION CONSULT NOTE - Follow Up Consult  Pharmacy Consult for warfarin Indication: atrial fibrillation  Patient Measurements: Height: 5\' 3"  (160 cm) Weight: 215 lb 13.3 oz (97.9 kg) (bedscale) IBW/kg (Calculated) : 52.4   Vital Signs: Temp: 97.4 F (36.3 C) (03/14 0636) Temp src: Oral (03/14 0636) BP: 102/54 mmHg (03/14 0636) Pulse Rate: 86 (03/14 0636)  Labs:  Recent Labs  01/13/13 0500 01/14/13 0500 01/15/13 0540  HGB 9.3*  --   --   HCT 29.5*  --   --   PLT 112*  --   --   LABPROT 22.2* 23.6* 24.9*  INR 2.04* 2.21* 2.38*  CREATININE 2.14* 2.20* 2.52*    Estimated Creatinine Clearance: 18.9 ml/min (by C-G formula based on Cr of 2.52).   Assessment: 79 y/o F who continues of warfarin for Afib. INR stable at 2.3, no bleeding issues noted. CBC is low and essentially stable. Continue home regimen of 3mg  daily and will change INR checks to MWF only.   Dopamine and lasix to be discontinued.   Goal of Therapy:  INR 2-3 Monitor platelets by anticoagulation protocol: Yes   Plan:  - Warfarin 3mg  daily - Daily PT/INR-MWF only,  - Monitor for signs and symptoms of bleeding  Sheppard Coil, PharmD Clinical Pharmacist Pager: [phone] Pharmacy: [phone] 01/15/2013 8:43 AM

## 2013-01-15 NOTE — Progress Notes (Signed)
Physical Therapy Treatment Patient Details Name: Gina Ashley MRN: [id number] DOB: 11-Jul-1927 Today's Date: 01/15/2013 Time: 4098-1191 PT Time Calculation (min): 28 min  PT Assessment / Plan / Recommendation Comments on Treatment Session  Pt with SOB, CHF, anasarca with LLE fx from [facility] with persist generalized weakness and particularly weak left dorsiflexors. Passive stretch to left achilles tendon performed with pt able to demonstrate with use of leg lifter. Pt encouraged to continue HEP and RN aware of lift for return to bed. Will follow. Pt would benefit from left PRAFO    Follow Up Recommendations        Does the patient have the potential to tolerate intense rehabilitation     Barriers to Discharge        Equipment Recommendations       Recommendations for Other Services    Frequency     Plan Discharge plan remains appropriate;Frequency remains appropriate    Precautions / Restrictions Precautions Precautions: Fall Required Braces or Orthoses: Knee Immobilizer - Left Knee Immobilizer - Left: On when out of bed or walking Restrictions LLE Weight Bearing: Non weight bearing   Pertinent Vitals/Pain No pain 2L throughout    Mobility  Bed Mobility Bed Mobility: Supine to Sit;Sitting - Scoot to Edge of Bed Supine to Sit: 3: Mod assist;HOB elevated;With rails Sitting - Scoot to Edge of Bed: 3: Mod assist Details for Bed Mobility Assistance: cueing for sequence with use of rail and HOB 20 degrees initially to sit then HOB flat to complete scooting to EOB. Assist needed to pivot legs to EOB and use of pad to scoot Transfers Transfers: Risk manager: 1: +2 Total assist Anterior-Posterior Transfers: Patient Percentage: 50% Details for Transfer Assistance: drop arm recliner not available and opted for AP transfer today which pt able to perform with greater participation than lateral transfer with cueing throughout for anterior weight  shift, hand placement and sequence to complete with use of pad to scoot and perform transfer Ambulation/Gait Ambulation/Gait Assistance: Not tested (comment)    Exercises General Exercises - Lower Extremity Ankle Circles/Pumps: AROM;PROM;Right;Left;15 reps;5 reps;Seated (AROm on right, Prom on left) Heel Slides: AAROM;Right;15 reps;Seated Hip ABduction/ADduction: AAROM;Both;15 reps;Seated Straight Leg Raises: AAROM;Both;15 reps;Seated   PT Diagnosis:    PT Problem List:   PT Treatment Interventions:     PT Goals Acute Rehab PT Goals PT Goal: Supine/Side to Sit - Progress: Progressing toward goal PT Transfer Goal: Bed to Chair/Chair to Bed - Progress: Progressing toward goal  Visit Information  Last PT Received On: 01/15/13 Assistance Needed: +2    Subjective Data  Subjective: I thank you for getting me up   Cognition  Cognition Overall Cognitive Status: Appears within functional limits for tasks assessed/performed Arousal/Alertness: Awake/alert Orientation Level: Appears intact for tasks assessed Behavior During Session: [facility] for tasks performed    Balance     End of Session PT - End of Session Equipment Utilized During Treatment: Left knee immobilizer Activity Tolerance: Patient tolerated treatment well Patient left: in chair;with call bell/phone within reach Nurse Communication: Mobility status;Need for lift equipment   GP     Delorse Ashley 01/15/2013, 10:05 AM Gina Ashley, PT [phone]

## 2013-01-16 DIAGNOSIS — A0472 Enterocolitis due to Clostridium difficile, not specified as recurrent: Secondary | ICD-10-CM | POA: Diagnosis not present

## 2013-01-16 LAB — GLUCOSE, CAPILLARY
Glucose-Capillary: 106 mg/dL — ABNORMAL HIGH (ref 70–99)
Glucose-Capillary: 136 mg/dL — ABNORMAL HIGH (ref 70–99)

## 2013-01-16 LAB — BASIC METABOLIC PANEL
CO2: 44 mEq/L (ref 19–32)
Calcium: 9.6 mg/dL (ref 8.4–10.5)
Creatinine, Ser: 2.79 mg/dL — ABNORMAL HIGH (ref 0.50–1.10)
Glucose, Bld: 123 mg/dL — ABNORMAL HIGH (ref 70–99)

## 2013-01-16 LAB — URINALYSIS, ROUTINE W REFLEX MICROSCOPIC
Glucose, UA: NEGATIVE mg/dL
Protein, ur: NEGATIVE mg/dL

## 2013-01-16 LAB — CBC
MCH: 26.6 pg (ref 26.0–34.0)
MCV: 89.2 fL (ref 78.0–100.0)
Platelets: 130 10*3/uL — ABNORMAL LOW (ref 150–400)
RDW: 16.2 % — ABNORMAL HIGH (ref 11.5–15.5)

## 2013-01-16 LAB — URINE MICROSCOPIC-ADD ON

## 2013-01-16 MED ORDER — SODIUM CHLORIDE 0.9 % IJ SOLN
10.0000 mL | INTRAMUSCULAR | Status: DC | PRN
  Administered 2013-01-16: 30 mL
  Administered 2013-01-17 – 2013-01-20 (×5): 10 mL
  Administered 2013-01-21: 20 mL
  Administered 2013-01-21 (×2): 10 mL
  Administered 2013-01-22: 20 mL
  Administered 2013-01-22: 10 mL

## 2013-01-16 MED ORDER — METRONIDAZOLE 500 MG PO TABS
500.0000 mg | ORAL_TABLET | Freq: Three times a day (TID) | ORAL | Status: DC
  Administered 2013-01-16 – 2013-01-21 (×16): 500 mg via ORAL
  Filled 2013-01-16 (×20): qty 1

## 2013-01-16 MED ORDER — CIPROFLOXACIN HCL 250 MG PO TABS
250.0000 mg | ORAL_TABLET | Freq: Two times a day (BID) | ORAL | Status: AC
  Administered 2013-01-16 – 2013-01-19 (×6): 250 mg via ORAL
  Filled 2013-01-16 (×7): qty 1

## 2013-01-16 MED ORDER — MICONAZOLE NITRATE 2 % VA CREA
1.0000 | TOPICAL_CREAM | Freq: Every day | VAGINAL | Status: DC
  Administered 2013-01-16 – 2013-01-22 (×7): 1 via VAGINAL
  Filled 2013-01-16: qty 45

## 2013-01-16 NOTE — Progress Notes (Signed)
Patient Name: Gina Ashley      SUBJECTIVE: Ms. Downard is an 79 y.o. female with history of chronic atrial fibrillation on coumadin, diastolic heart failure and HTN. She also has DM type 2, COPD, OSA on CPAP and renal failure with baseline Cr 1.5-1.7. She has had 4 hospitalizations in the last 6 months, 1 included tibia/fibula fracture and the rest have been due to massive fluid overload in the setting of diastolic heart failure. She has required short term dialysis in the past for renal failure.  Echo with RV dysfunction and low urine output therefore started on milrinone 0.25 mcg/kg/min on 3/5. Had poor urine output so low dose dopamine added 3/7.  Overall 47 pound diuresis since admit. Yesterday lasix drip and dopamine stopped. She became hypotensive yesterday and received 500 cc. BP improved. 24 hour I/O -1.1 liters. Weight stable.  Denies SOB/PND/Orthopnea. Feels much better.    Past Medical History  Diagnosis Date  . GERD (gastroesophageal reflux disease)   . Back pain, chronic   . Pain in shoulder   . Pain in ankle   . Hiatal hernia   . HTN (hypertension)     all her life  . CKD (chronic kidney disease), stage III     GFR: 38  . Atrial fibrillation     since 1996  . Sleep apnea   . Fall 3 weeks ago    was evaluated at [facility]  . OSA on CPAP   . Depression   . PONV (postoperative nausea and vomiting)     difficult to wake up  . Chronic diastolic heart failure 08/31/2012  . Tibia fracture     BILATERAL  . Renal failure     acute on chronic   . Fibula fracture     bilateral  . Respiratory failure     acute on chronic hx of  requiring BIPAP  . COPD (chronic obstructive pulmonary disease)   . Metabolic encephalopathy     hx of   . Renal failure     acute on chronic with need for intermittent dialysis - hx of   . UTI (urinary tract infection)     hx of   . Pleural effusion     right sided now resolved   . Diabetes mellitus     x 15 yrs, hx of hypoglycemia     . CHF (congestive heart failure)     diastolic HF    PHYSICAL EXAM Filed Vitals:   01/15/13 1040 01/15/13 1335 01/15/13 1951 01/16/13 0640  BP: 112/67 96/56 111/68 121/77  Pulse: 74 78 84 76  Temp: 97.9 F (36.6 C) 97.9 F (36.6 C) 97.6 F (36.4 C) 98 F (36.7 C)  TempSrc: Oral Oral Oral Axillary  Resp: 18 20 18 18   Height:      Weight:    217 lb 6 oz (98.6 kg)  SpO2: 94% 95% 98% 97%   Well developed and obese woman HENT normal Neck supple with JVP>10 I thinnk Carotids brisk and full without bruits Clear nut diominshed Regular rate and rhythm, 2/6 sys murmur Abd-soft with active BS without hepatomegaly No Clubbing cyanosis 2+edema Skin-warm and dry A & Oriented  Grossly normal sensory and motor function   TELEMETRY: Reviewed telemetry pt in AFlutter:    Intake/Output Summary (Last 24 hours) at 01/16/13 0817 Last data filed at 01/16/13 0642  Gross per 24 hour  Intake    970 ml  Output    400 ml  Net  570 ml    LABS: Basic Metabolic Panel:  Recent Labs Lab 01/10/13 0500 01/11/13 0500 01/11/13 0810 01/12/13 0500 01/13/13 0500 01/14/13 0500 01/15/13 0540 01/16/13 0500  NA 134* 134*  --  134* 137 138 137 137  K 3.4* 3.4*  --  3.8 3.4* 3.0* 3.7 4.0  CL 85* 83*  --  84* 84* 84* 85* 86*  CO2 >45* >45*  --  45* 45* 44* 45* 44*  GLUCOSE 145* 171*  --  139* 179* 98 116* 123*  BUN 52* 52*  --  54* 56* 56* 59* 65*  CREATININE 2.08* 2.10*  --  2.12* 2.14* 2.20* 2.52* 2.79*  CALCIUM 9.8 9.9  --  10.2 10.0 10.3 9.8 9.6  MG  --  2.0 1.9  --   --   --   --   --    Cardiac Enzymes: No results found for this basename: CKTOTAL, CKMB, CKMBINDEX, TROPONINI,  in the last 72 hours CBC:  Recent Labs Lab 01/10/13 0500 01/11/13 0500 01/13/13 0500 01/16/13 0500  WBC 6.2  --  5.1 4.8  HGB 9.0* 9.3* 9.3* 9.4*  HCT 28.5* 29.5* 29.5* 31.5*  MCV 87.2  --  86.0 89.2  PLT 90*  --  112* 130*   PROTIME:  Recent Labs  01/14/13 0500 01/15/13 0540  LABPROT 23.6*  24.9*  INR 2.21* 2.38*   Liver Function Tests: No results found for this basename: AST, ALT, ALKPHOS, BILITOT, PROT, ALBUMIN,  in the last 72 hours No results found for this basename: LIPASE, AMYLASE,  in the last 72 hours BNP: BNP (last 3 results)  Recent Labs  01/03/13 0800  PROBNP 4706.0*   D-Dimer: No results found for this basename: DDIMER,  in the last 72 hours Hemoglobin A1C: No results found for this basename: HGBA1C,  in the last 72 hours   ASSESSMENT AND PLAN:  Principal Problem:   Shortness of breath Active Problems:     PAFlutter   COPD (chronic obstructive pulmonary disease)     CKD (chronic kidney disease)   Chronic diastolic heart failure   Obesity (BMI 30-39.9)       Cardiorenal syndrome with renal failure     Acute on chronic diastolic heart failure  Renal function is worsening. This despite holding diuretics. Her weight is up about 1 kg consistent with her I.'s and O.'s. I've asked her whether you she never been shocked before. She does not recall that she has. I wonder whether it is worth attempting cardioversion for atrial flutter. This may help augment cardiac performance especially with her diastolic heart failure.  We'll continue to hold diuretics today. Continue carvedilol with reasonable blood pressure.  Signed, Sherryl Manges MD  01/16/2013

## 2013-01-16 NOTE — Progress Notes (Signed)
CRITICAL VALUE ALERT  Critical value received:  CO2 44  Date of notification:  01/16/13  Time of notification:  0641  Critical value read back:yes  Nurse who received alert:  Leonia Reeves, RN  MD notified (1st page):  Claiborne Billings, NP  Time of first page:  [phone]  MD notified (2nd page):  Time of second page:  Responding MD:  Claiborne Billings, NP  Time MD responded:  [phone]

## 2013-01-16 NOTE — Plan of Care (Signed)
Problem: Phase I Progression Outcomes Goal: EF % per last Echo/documented,Core Reminder form on chart Ef per last echo on 01/04/13 showed an Ef of 50-55%.

## 2013-01-16 NOTE — Progress Notes (Signed)
Around 0140 was called into patient's room. Patient appeared to have a lot of discomfort and was complaining of burning and pain in vaginal area. Patient was groaning and crying out in pain. Assessed pt.and seen that patient had a rash on her labia, redness, excoriation and some scant white discharge. Mid level MD on call for [facility] was paged and notified. New orders written. Once foley inserted patient had 300 cc of urine output. U/A obtained. Patient also received vaginal cream. Patient has no further complaints of discomfort at this time and is resting in bed.

## 2013-01-16 NOTE — Progress Notes (Signed)
[facility] PROGRESS NOTE  Gina Ashley ZOX:[id number] DOB: January 13, 1927 DOA: 01/02/2013 PCP: Ernestine Conrad, MD  Assessment/Plan: Principal Problem:   Shortness of breath Active Problems:   DM   OBSTRUCTIVE SLEEP APNEA   HYPERTENSION   GERD   Persistent Atrial flutter   COPD (chronic obstructive pulmonary disease)   Anemia due to chronic illness   CKD (chronic kidney disease)   Chronic diastolic heart failure   Obesity (BMI 30-39.9)   Depression   Cardiorenal syndrome with renal failure   Hypokalemia   Acute on chronic diastolic heart failure    1. Acute right heart congestive heart failure: Patient presented with worsening edema, increasing abdominal girth and SOB, despite iv Lasix. Clinically, she was found to be anasarcic and felt to have acute on chronic right sided HF and marked volume overload on exam. Dr Marca Ancona provided cardiology consultation, reviewed 2D echocardiogram, which showed poorly visualized moderately dilated RV with moderately decreased systolic function and mildly left-shifted IV septum. Patient was initially managed with ivi Milrinone, as well as aggressive diuresis. Dr Reuel Boom Bensimhon/Heart Failure team followed subsequently, and were extremely helpful in rationalizing heart failure treatment. Clinically, patient has responded satisfactorily to treatment, and as of 01/15/13, was feeling considerably better. Diuretics were discontinued on 01/14/13, due to hypotension, and patient administered a bolus of NS. BP is satisfactory at this time, but diuretics are still on hold, due to tenuous renal status. Managing per cardiology recommendations.  2. Acute on chronic kidney disease stage 3/cardio-renal syndrome: Patient's baseline creatinine was 1.14 on 09/15/12. Creatinine was 1.65 at presentation, and gradually climbed, reaching a high of 2.20 on 01/14/13. GFR now in CKD stage 4 range. In the initial few days of hospitalization, she was managed with ivi low dose  Dopamine. This was discontinued on 01/14/13. Following renal indices. Nephrology consultation was provided by Dr Delano Metz. Patient is considered a poor candidate for RRT, due to her co-morbidities. The discussion in #1 above is pertinent.  3. PAF (paroxysmal atrial fibrillation), was in SR 06/2012: Patient has a known history of chronic atrial fibrillation/flutter and has failed DCCV in the past. Managed with Coreg and Amiodarone, with satisfactory rate control. On Coumadin per pharmacy, and INR remains therapeutic  4. Hypokalemia: Likely due to diuretics. Repleted as indicated. Following Lytes.  5. DM with complications (CKD), controlled: Hemoglobin A1c on this admission 6.8. Managing with diet, SSI and Lantus, with reasonable control.   6. HTN: Patient has a known history of HTN, but during this hospitalization, BP has been borderline to low. On Bidil and Coreg (dose lowered this admit, secondary to hypotension), with holding parameters orders. Patient appears to be tolerating borderline BP well.   7. Depression: Stable on Buspirone.  8. Anemia: Patient has a mild normocytic anemia, due to chronic illness. HB has remained stable/reasonable.  9. OSA: Known history of OSA/OHS: Managing with nocturnal CPAP, and appear stable from this view point as of 01/15/13, although as discussed in #1, she presented with right heart failure. 10. Physical deconditioning: Patient is significantly deconditioned, due to multiple co-morbidites, multiple hospitalizations over past several months and recent acute illness. PT/OT following. Plan to [facility], when stable.   11. C. Difficile infection: Patient had a brief episode of diarrhea on 01/05/13. C.Difficile PCR is positive. She has been commenced on a 10-day course of Flagyl, effective 01/16/13.  12. UTI: U/A of 01/16/13, demonstrated significant pyuria and bacteriuria. Culture is pending. Will pace on a 3-day course of Ciprofloxacin (avoiding long  course, due to C.  Difficile infection).    Code Status: Limited code blue. No CPR or intubation but OK with anti-arrhythmic meds, defib/cardiovert and vasoactive drugs.  Family Communication:  Disposition Plan: To be determined.    Brief narrative: 79 y.o. female with history of GERD, chronic back pain, Hiatal hernia, HTN, CKD stage III; Atrial fibrillation, OSA on CPAP, Depression, Chronic diastolic heart failure (08/31/2012), Tibia/Fibula fracture s/p surgical repair with post-op course complicated by ARF, requiring temporary HD, COPD, Obesity Hypoventilation syndrome, chronic respiratory failure. Following her last hospitalization, she was discharged to [facility] and has been living there since then. Family states that 1 month ago she had her perma-cath was discontinued. For the past 6 days, she has had worsening edema with increasing abdominal girth and SOB, despite iv Lasix. She was sent to [facility] ED for further treatment, and was admitted.   Consultants:  Dr Arvilla Meres, cardiologist.  Dr dalton Shirlee Latch, cardiologist.   Dr Johny Sax, ID.  Dr Delano Metz, nephrologist.   Procedures:  CXRs  2D Echocardiogram     Antibiotics:  N/A.   HPI/Subjective: No new issues.   Objective: Vital signs in last 24 hours: Temp:  [97.4 F (36.3 C)-98 F (36.7 C)] 97.4 F (36.3 C) (03/15 1353) Pulse Rate:  [76-84] 79 (03/15 1353) Resp:  [18] 18 (03/15 1353) BP: (95-121)/(61-77) 95/61 mmHg (03/15 1353) SpO2:  [96 %-98 %] 96 % (03/15 1353) Weight:  [98.6 kg (217 lb 6 oz)] 98.6 kg (217 lb 6 oz) (03/15 0640) Weight change: 1.077 kg (2 lb 6 oz) Last BM Date: 01/16/13  Intake/Output from previous day: 03/14 0701 - 03/15 0700 In: 970 [P.O.:720] Out: 400 [Urine:400] Total I/O In: 320 [P.O.:320] Out: 325 [Urine:325]   Physical Exam: General: Comfortable, alert, communicative, fully oriented, not short of breath at rest.  HEENT:  Mild clinical pallor, no jaundice, no conjunctival  injection or discharge. NECK:  Supple, JVP not seen, no carotid bruits, no palpable lymphadenopathy, no palpable goiter. CHEST:  Clinically clear to auscultation, no wheezes, no crackles. HEART:  Sounds 1 and 2 heard, normal, regular, no murmurs. ABDOMEN:  Obese, soft, non-tender, no palpable organomegaly, no palpable masses, normal bowel sounds. GENITALIA:  Not examined. LOWER EXTREMITIES:  Moderate pitting edema, bilateral LLE.  MUSCULOSKELETAL SYSTEM:  Generalized osteoarthritic changes, otherwise, normal. CENTRAL NERVOUS SYSTEM:  No focal neurologic deficit on gross examination.  Lab Results:  Recent Labs  01/16/13 0500  WBC 4.8  HGB 9.4*  HCT 31.5*  PLT 130*    Recent Labs  01/15/13 0540 01/16/13 0500  NA 137 137  K 3.7 4.0  CL 85* 86*  CO2 45* 44*  GLUCOSE 116* 123*  BUN 59* 65*  CREATININE 2.52* 2.79*  CALCIUM 9.8 9.6   Recent Results (from the past 240 hour(s))  CLOSTRIDIUM DIFFICILE BY PCR     Status: Abnormal   Collection Time    01/16/13  3:35 AM      Result Value Range Status   C difficile by pcr POSITIVE (*) NEGATIVE Final   Comment: CRITICAL RESULT CALLED TO, READ BACK BY AND VERIFIED WITH:     HANEY R.,RN 01/16/13 0842 BY JONESJ     Studies/Results: No results found.  Medications: Scheduled Meds: . acetaZOLAMIDE  500 mg Oral BID  . allopurinol  100 mg Oral Daily  . amiodarone  200 mg Oral BID  . busPIRone  5 mg Oral Q12H  . carvedilol  12.5 mg Oral BID WC  .  docusate sodium  100 mg Oral Q8H  . feeding supplement (NEPRO CARB STEADY)  237 mL Oral BID BM  . guaiFENesin  600 mg Oral BID  . insulin aspart  0-5 Units Subcutaneous QHS  . insulin aspart  0-9 Units Subcutaneous TID WC  . insulin glargine  25 Units Subcutaneous QHS  . isosorbide-hydrALAZINE  1 tablet Oral BID  . metroNIDAZOLE  500 mg Oral Q8H  . miconazole  1 Applicatorful Vaginal QHS  . OxyCODONE  10 mg Oral Q12H  . pantoprazole  40 mg Oral Daily  . polyethylene glycol  17 g Oral  BID  . traZODone  50 mg Oral QHS  . warfarin  3 mg Oral q1800  . Warfarin - Pharmacist Dosing Inpatient   Does not apply q1800   Continuous Infusions:  PRN Meds:.acetaminophen, albuterol, alum & mag hydroxide-simeth, diphenhydrAMINE, lactulose, nitroGLYCERIN, ondansetron (ZOFRAN) IV, oxyCODONE, simethicone, sodium chloride    LOS: 14 days   Callum Wolf,CHRISTOPHER  [facility] Pager [phone]. If 8PM-8AM, please contact night-coverage at [email or web address], password [facility] 01/16/2013, 4:41 PM  LOS: 14 days

## 2013-01-17 DIAGNOSIS — A0472 Enterocolitis due to Clostridium difficile, not specified as recurrent: Secondary | ICD-10-CM

## 2013-01-17 LAB — BASIC METABOLIC PANEL
BUN: 70 mg/dL — ABNORMAL HIGH (ref 6–23)
Calcium: 9.6 mg/dL (ref 8.4–10.5)
Creatinine, Ser: 2.87 mg/dL — ABNORMAL HIGH (ref 0.50–1.10)
GFR calc Af Amer: 16 mL/min — ABNORMAL LOW (ref >90)
GFR calc non Af Amer: 14 mL/min — ABNORMAL LOW (ref >90)
Potassium: 3.4 mEq/L — ABNORMAL LOW (ref 3.5–5.1)

## 2013-01-17 LAB — GLUCOSE, CAPILLARY: Glucose-Capillary: 169 mg/dL — ABNORMAL HIGH (ref 70–99)

## 2013-01-17 LAB — CBC
MCH: 27 pg (ref 26.0–34.0)
MCHC: 30.4 g/dL (ref 30.0–36.0)
MCV: 88.7 fL (ref 78.0–100.0)
Platelets: 128 10*3/uL — ABNORMAL LOW (ref 150–400)
RDW: 16.1 % — ABNORMAL HIGH (ref 11.5–15.5)

## 2013-01-17 MED ORDER — DOPAMINE-DEXTROSE 3.2-5 MG/ML-% IV SOLN
2.5000 ug/kg/min | INTRAVENOUS | Status: DC
  Administered 2013-01-17: 2.5 ug/kg/min via INTRAVENOUS
  Filled 2013-01-17 (×2): qty 250

## 2013-01-17 MED ORDER — FUROSEMIDE 10 MG/ML IJ SOLN
160.0000 mg | Freq: Two times a day (BID) | INTRAVENOUS | Status: AC
  Administered 2013-01-17 (×2): 160 mg via INTRAVENOUS
  Filled 2013-01-17 (×2): qty 16

## 2013-01-17 MED ORDER — POTASSIUM CHLORIDE CRYS ER 20 MEQ PO TBCR
40.0000 meq | EXTENDED_RELEASE_TABLET | Freq: Two times a day (BID) | ORAL | Status: AC
  Administered 2013-01-17 – 2013-01-18 (×3): 40 meq via ORAL
  Filled 2013-01-17 (×3): qty 2

## 2013-01-17 MED ORDER — MILRINONE IN DEXTROSE 20 MG/100ML IV SOLN
0.1250 ug/kg/min | INTRAVENOUS | Status: DC
  Administered 2013-01-17: 0.125 ug/kg/min via INTRAVENOUS
  Filled 2013-01-17 (×3): qty 100

## 2013-01-17 NOTE — Progress Notes (Signed)
ANTICOAGULATION CONSULT NOTE - Follow Up Consult  Pharmacy Consult for warfarin Indication: atrial fibrillation  Patient Measurements: Height: 5\' 3"  (160 cm) Weight: 220 lb (99.791 kg) IBW/kg (Calculated) : 52.4   Vital Signs: Temp: 98 F (36.7 C) (03/16 0359) Temp src: Oral (03/16 0359) BP: 102/67 mmHg (03/16 0901) Pulse Rate: 91 (03/16 0901)  Labs:  Recent Labs  01/15/13 0540 01/16/13 0500 01/17/13 0505  HGB  --  9.4* 9.1*  HCT  --  31.5* 29.9*  PLT  --  130* 128*  LABPROT 24.9*  --  28.5*  INR 2.38*  --  2.86*  CREATININE 2.52* 2.79* 2.87*    Estimated Creatinine Clearance: 16.7 ml/min (by C-G formula based on Cr of 2.87).   Assessment: 79 y/o F who continues of warfarin for Afib. INR therapeutic at 2.86, no bleeding issues noted. CBC is low and essentially stable. Home regimen is 3 mg daily.  Pt was switched to MWF INR checks, but due to addition of metronidazole (significantly increases INR) and now cipro (can also increase INR), will switch back to daily INR checks.     Goal of Therapy:  INR 2-3 Monitor platelets by anticoagulation protocol: Yes   Plan:  - Warfarin 3mg  daily - Daily PT/INR - Monitor for signs and symptoms of bleeding    Doris Cheadle, PharmD Clinical Pharmacist Pager: [phone] Phone: [phone] 01/17/2013 9:28 AM

## 2013-01-17 NOTE — Progress Notes (Signed)
[facility] PROGRESS NOTE  Gina Ashley ZOX:[id number] DOB: Jun 27, 1927 DOA: 01/02/2013 PCP: Ernestine Conrad, MD  Assessment/Plan: Principal Problem:   Shortness of breath Active Problems:   DM   OBSTRUCTIVE SLEEP APNEA   HYPERTENSION   GERD   Persistent Atrial flutter   COPD (chronic obstructive pulmonary disease)   Anemia due to chronic illness   CKD (chronic kidney disease)   Chronic diastolic heart failure   Obesity (BMI 30-39.9)   Depression   Cardiorenal syndrome with renal failure   Hypokalemia   Acute on chronic diastolic heart failure   Enteritis due to Clostridium difficile    1. Acute right heart congestive heart failure: Patient presented with worsening edema, increasing abdominal girth and SOB, despite iv Lasix. Clinically, she was found to be anasarcic and felt to have acute on chronic right sided HF and marked volume overload on exam. Dr Marca Ancona provided cardiology consultation, reviewed 2D echocardiogram, which showed poorly visualized moderately dilated RV with moderately decreased systolic function and mildly left-shifted IV septum. Patient was initially managed with ivi Milrinone, as well as aggressive diuresis. Dr Reuel Boom Bensimhon/Heart Failure team followed subsequently, and were extremely helpful in rationalizing heart failure treatment. Clinically, patient has responded satisfactorily to treatment, and as of 01/15/13, was feeling considerably better. Diuretics were discontinued on 01/14/13, due to hypotension, and patient administered a bolus of NS. BP is satisfactory at this time. Diuretics were placed on hold till 01/17/13, but as creatinine has failed to stabilize, and weight is going up, Dr Graciela Husbands today restarted Milrinone/Dopamine. RRT may yet be considered. Managing per cardiology recommendations.  2. Acute on chronic kidney disease stage 3/cardio-renal syndrome: Patient's baseline creatinine was 1.14 on 09/15/12. Creatinine was 1.65 at presentation, and  gradually climbed, reaching a high of 2.20 on 01/14/13. GFR now in CKD stage 4 range. In the initial few days of hospitalization, she was managed with ivi low dose Dopamine. This was discontinued on 01/14/13. Following renal indices. Nephrology consultation was provided by Dr Delano Metz. Patient is considered a poor candidate for RRT, due to her co-morbidities. The discussion in #1 above is pertinent.  3. PAF (paroxysmal atrial fibrillation), was in SR 06/2012: Patient has a known history of chronic atrial fibrillation/flutter and has failed DCCV in the past. Managed with Coreg and Amiodarone, with satisfactory rate control. On Coumadin per pharmacy, and INR remains therapeutic  4. Hypokalemia: Likely due to diuretics. Repleted as indicated. Following Lytes.  5. DM with complications (CKD), controlled: Hemoglobin A1c on this admission 6.8. Managing with diet, SSI and Lantus, with reasonable control.   6. HTN: Patient has a known history of HTN, but during this hospitalization, BP has been borderline to low. On Bidil and Coreg (dose lowered this admit, secondary to hypotension), with holding parameters orders. Patient appears to be tolerating borderline BP well.   7. Depression: Stable on Buspirone.  8. Anemia: Patient has a mild normocytic anemia, due to chronic illness. HB has remained stable/reasonable.  9. OSA: Known history of OSA/OHS: Managing with nocturnal CPAP, and appear stable from this view point as of 01/15/13, although as discussed in #1, she presented with right heart failure. 10. Physical deconditioning: Patient is significantly deconditioned, due to multiple co-morbidites, multiple hospitalizations over past several months and recent acute illness. PT/OT following. Plan to [facility], when stable.   11. C. Difficile infection: Patient had a brief episode of diarrhea on 01/05/13. C.Difficile PCR is positive. She has been commenced on a 10-day course of Flagyl, effective  01/16/13 now day #2.   12. UTI: U/A of 01/16/13, demonstrated significant pyuria and bacteriuria. Culture is positive for GNR. On 3-day course of Ciprofloxacin (avoiding long course, due to C. Difficile infection), now day#2.    Code Status: Limited code blue. No CPR or intubation but OK with anti-arrhythmic meds, defib/cardiovert and vasoactive drugs.  Family Communication:  Disposition Plan: To be determined.    Brief narrative: 79 y.o. female with history of GERD, chronic back pain, Hiatal hernia, HTN, CKD stage III; Atrial fibrillation, OSA on CPAP, Depression, Chronic diastolic heart failure (08/31/2012), Tibia/Fibula fracture s/p surgical repair with post-op course complicated by ARF, requiring temporary HD, COPD, Obesity Hypoventilation syndrome, chronic respiratory failure. Following her last hospitalization, she was discharged to [facility] and has been living there since then. Family states that 1 month ago she had her perma-cath was discontinued. For the past 6 days, she has had worsening edema with increasing abdominal girth and SOB, despite iv Lasix. She was sent to [facility] ED for further treatment, and was admitted.   Consultants:  Dr Arvilla Meres, cardiologist.  Dr dalton Shirlee Latch, cardiologist.   Dr Johny Sax, ID.  Dr Delano Metz, nephrologist.   Procedures:  CXRs  2D Echocardiogram     Antibiotics:  N/A.   HPI/Subjective: Feels quite well. No diarrhea.    Objective: Vital signs in last 24 hours: Temp:  [97 F (36.1 C)-98 F (36.7 C)] 98 F (36.7 C) (03/16 0359) Pulse Rate:  [63-91] 91 (03/16 0901) Resp:  [18] 18 (03/16 0359) BP: (95-123)/(61-69) 102/67 mmHg (03/16 0901) SpO2:  [96 %-100 %] 100 % (03/16 0359) Weight:  [99.791 kg (220 lb)] 99.791 kg (220 lb) (03/16 0359) Weight change: 1.191 kg (2 lb 10 oz) Last BM Date: 01/17/13  Intake/Output from previous day: 03/15 0701 - 03/16 0700 In: 640 [P.O.:640] Out: 625 [Urine:625]     Physical Exam: General:  Comfortable, alert, communicative, fully oriented, not short of breath at rest.  HEENT:  Mild clinical pallor, no jaundice, no conjunctival injection or discharge. NECK:  Supple, JVP not seen, no carotid bruits, no palpable lymphadenopathy, no palpable goiter. CHEST:  Fine crackles right base. HEART:  Sounds 1 and 2 heard, normal, regular, no murmurs. ABDOMEN:  Obese, soft, non-tender, no palpable organomegaly, no palpable masses, normal bowel sounds. GENITALIA:  Not examined. LOWER EXTREMITIES:  Moderate pitting edema, bilateral LLE.  MUSCULOSKELETAL SYSTEM:  Generalized osteoarthritic changes, otherwise, normal. CENTRAL NERVOUS SYSTEM:  No focal neurologic deficit on gross examination.  Lab Results:  Recent Labs  01/16/13 0500 01/17/13 0505  WBC 4.8 4.8  HGB 9.4* 9.1*  HCT 31.5* 29.9*  PLT 130* 128*    Recent Labs  01/16/13 0500 01/17/13 0505  NA 137 134*  K 4.0 3.4*  CL 86* 85*  CO2 44* 41*  GLUCOSE 123* 132*  BUN 65* 70*  CREATININE 2.79* 2.87*  CALCIUM 9.6 9.6   Recent Results (from the past 240 hour(s))  URINE CULTURE     Status: None   Collection Time    01/16/13  2:30 AM      Result Value Range Status   Specimen Description URINE, CATHETERIZED   Final   Special Requests CX ADDED AT 0324 ON 119147   Final   Culture  Setup Time 01/16/2013 11:18   Final   Colony Count >=100,000 COLONIES/ML   Final   Culture GRAM NEGATIVE RODS   Final   Report Status PENDING   Incomplete  CLOSTRIDIUM DIFFICILE BY PCR  Status: Abnormal   Collection Time    01/16/13  3:35 AM      Result Value Range Status   C difficile by pcr POSITIVE (*) NEGATIVE Final   Comment: CRITICAL RESULT CALLED TO, READ BACK BY AND VERIFIED WITH:     HANEY R.,RN 01/16/13 0842 BY JONESJ     Studies/Results: No results found.  Medications: Scheduled Meds: . acetaZOLAMIDE  500 mg Oral BID  . allopurinol  100 mg Oral Daily  . amiodarone  200 mg Oral BID  . busPIRone  5 mg Oral Q12H  .  carvedilol  12.5 mg Oral BID WC  . ciprofloxacin  250 mg Oral BID  . docusate sodium  100 mg Oral Q8H  . feeding supplement (NEPRO CARB STEADY)  237 mL Oral BID BM  . furosemide  160 mg Intravenous BID  . guaiFENesin  600 mg Oral BID  . insulin aspart  0-5 Units Subcutaneous QHS  . insulin aspart  0-9 Units Subcutaneous TID WC  . insulin glargine  25 Units Subcutaneous QHS  . isosorbide-hydrALAZINE  1 tablet Oral BID  . metroNIDAZOLE  500 mg Oral Q8H  . miconazole  1 Applicatorful Vaginal QHS  . OxyCODONE  10 mg Oral Q12H  . pantoprazole  40 mg Oral Daily  . polyethylene glycol  17 g Oral BID  . potassium chloride  40 mEq Oral BID  . traZODone  50 mg Oral QHS  . warfarin  3 mg Oral q1800  . Warfarin - Pharmacist Dosing Inpatient   Does not apply q1800   Continuous Infusions: . DOPamine 2.5 mcg/kg/min (01/17/13 1005)  . milrinone 0.125 mcg/kg/min (01/17/13 1016)   PRN Meds:.acetaminophen, albuterol, alum & mag hydroxide-simeth, diphenhydrAMINE, lactulose, nitroGLYCERIN, ondansetron (ZOFRAN) IV, oxyCODONE, simethicone, sodium chloride    LOS: 15 days   Moosa Bueche,CHRISTOPHER  [facility] Pager [phone]. If 8PM-8AM, please contact night-coverage at [email or web address], password [facility] 01/17/2013, 1:22 PM  LOS: 15 days

## 2013-01-17 NOTE — Consult Note (Addendum)
Gina Ashley 01/17/2013 Jalayne Ganesh D Requesting Physician:  Dr Graciela Husbands  Reason for Consult:  Cardiorenal syndrome HPI: The patient is a 79 y.o. year-old with hx of morbid obesity, OHS with chronic CO2 retention, DM2 x 15 years and anasarca due to R HF ([place]). She was admitted on 3/1 with anasarca.  She has diuresed with diuretics, milrinone and IV dopamine per the cardiology service.  Since milrinone and dopamine were stopped (3/10 and 3/13 respectively) , renal function has worsened with creatinine up to 2.87 from baseline 1.8-2.0 and weight is up from 97 > 100kg. Admit weight was 117kg.   Patient c/o severe fatigue, almost constant.  Per records she has been bedridden x the last 6 months which has included a fall at a SNF with bilat leg fracture and surgical repair.    Last ABG 3/3 was 7.38/67/87 Last CXR 3/5 showed bilat pulm edema unchanged and moderate R effusion    ROS  no cp or fevers  no confusion or hallucination  no n/v/d  no abd pain   Past Medical History:  Past Medical History  Diagnosis Date  . GERD (gastroesophageal reflux disease)   . Back pain, chronic   . Pain in shoulder   . Pain in ankle   . Hiatal hernia   . HTN (hypertension)     all her life  . CKD (chronic kidney disease), stage III     GFR: 38  . Atrial fibrillation     since 1996  . Sleep apnea   . Fall 3 weeks ago    was evaluated at [facility]  . OSA on CPAP   . Depression   . PONV (postoperative nausea and vomiting)     difficult to wake up  . Chronic diastolic heart failure 08/31/2012  . Tibia fracture     BILATERAL  . Renal failure     acute on chronic   . Fibula fracture     bilateral  . Respiratory failure     acute on chronic hx of  requiring BIPAP  . COPD (chronic obstructive pulmonary disease)   . Metabolic encephalopathy     hx of   . Renal failure     acute on chronic with need for intermittent dialysis - hx of   . UTI (urinary tract infection)     hx of   .  Pleural effusion     right sided now resolved   . Diabetes mellitus     x 15 yrs, hx of hypoglycemia   . CHF (congestive heart failure)     diastolic HF    Past Surgical History:  Past Surgical History  Procedure Laterality Date  . Bunionectomy      bilateral  . Cholecystectomy    . Rotator cuff repair      2002  . Total knee arthroplasty      bilateral 2001  . Hammer toe surgery      2004  . Colonoscopy  07/11/2011    Procedure: COLONOSCOPY;  Surgeon: Malissa Hippo, MD;  Location: [facility];  Service: Endoscopy;  Laterality: N/A;  3:00  . Orif periprosthetic fracture  09/08/2012    Procedure: OPEN REDUCTION INTERNAL FIXATION (ORIF) PERIPROSTHETIC FRACTURE;  Surgeon: Shelda Pal, MD;  Location: [facility] ORS;  Service: Orthopedics;  Laterality: Right;  ORIF periprosthetic right proximal femur fracture Spanning external fixator left distal tibia  . External fixation leg  09/08/2012    Procedure: EXTERNAL FIXATION LEG;  Surgeon:  Shelda Pal, MD;  Location: [facility] ORS;  Service: Orthopedics;  Laterality: Left;  . Joint replacement      bilateral knees   . External fixation leg  11/30/2012    Procedure: EXTERNAL FIXATION LEG;  Surgeon: Shelda Pal, MD;  Location: [facility] ORS;  Service: Orthopedics;  Laterality: Left;  Removal of External Fixation Left Knee with Evaluation with Floroscopy    Family History:  Family History  Problem Relation Age of Onset  . Colon cancer Brother    Social History:  reports that she has never smoked. She has never used smokeless tobacco. She reports that she does not drink alcohol or use illicit drugs.  Allergies:  Allergies  Allergen Reactions  . Morphine Sulfate Other (See Comments)    REACTION: change in personality  . Remeron (Mirtazapine) Other (See Comments)    Altered mental status, lethargy  . Tuna (Fish Allergy) Other (See Comments)    unknown  . Penicillins Rash    Tolerates Ancef.    Home medications: Prior to Admission medications    Medication Sig Start Date End Date Taking? Authorizing Provider  busPIRone (BUSPAR) 5 MG tablet Take 5 mg by mouth every 12 (twelve) hours.   Yes Historical Provider, MD  diphenhydrAMINE (BENADRYL) 25 mg capsule Take 25 mg by mouth every 8 (eight) hours as needed for itching.   Yes Historical Provider, MD  HYDROcodone-acetaminophen (NORCO/VICODIN) 5-325 MG per tablet Take 1 tablet by mouth every 4 (four) hours as needed for pain.   Yes Historical Provider, MD  Menthol-Zinc Oxide 0.4-20.5 % PSTE Apply 1 application topically daily.   Yes Historical Provider, MD  Multiple Vitamin (MULTIVITAMIN WITH MINERALS) TABS Take 1 tablet by mouth daily.   Yes Historical Provider, MD  ondansetron (ZOFRAN) 4 MG tablet Take 4 mg by mouth every 6 (six) hours as needed for nausea.   Yes Historical Provider, MD  oxyCODONE (OXY IR/ROXICODONE) 5 MG immediate release tablet Take 5 mg by mouth every 4 (four) hours as needed for pain.   Yes Historical Provider, MD  simethicone (MYLICON) 80 MG chewable tablet Chew 80 mg by mouth every 6 (six) hours as needed for flatulence.   Yes Historical Provider, MD  spironolactone (ALDACTONE) 25 MG tablet Take 25 mg by mouth daily.   Yes Historical Provider, MD  torsemide (DEMADEX) 20 MG tablet Take 20 mg by mouth daily.   Yes Historical Provider, MD  vitamin C (ASCORBIC ACID) 500 MG tablet Take 500 mg by mouth every 12 (twelve) hours.   Yes Historical Provider, MD  warfarin (COUMADIN) 3 MG tablet Take 3 mg by mouth daily.   Yes Historical Provider, MD  allopurinol (ZYLOPRIM) 100 MG tablet Take 100 mg by mouth daily.    Historical Provider, MD  amiodarone (PACERONE) 200 MG tablet Take 200 mg by mouth daily.     Historical Provider, MD  carvedilol (COREG) 25 MG tablet Take 25 mg by mouth 2 (two) times daily with a meal.    Historical Provider, MD  docusate sodium (COLACE) 100 MG capsule Take 100 mg by mouth every 8 (eight) hours.     Historical Provider, MD  guaiFENesin (MUCINEX) 600  MG 12 hr tablet Take 600 mg by mouth 2 (two) times daily.     Historical Provider, MD  hydrALAZINE (APRESOLINE) 25 MG tablet Take 25 mg by mouth 3 (three) times daily.    Historical Provider, MD  insulin aspart (NOVOLOG) 100 UNIT/ML injection Inject 1-5 Units into the skin  3 (three) times daily with meals. 150-200=1unit,201-250=2units,251-300=3units,301-350=4units,351-400=5units if over 400 call MD 09/15/12   Gwenyth Bender, NP  insulin glargine (LANTUS) 100 UNIT/ML injection Inject 5 Units into the skin at bedtime.    Historical Provider, MD  isosorbide dinitrate (ISORDIL) 20 MG tablet Take 20 mg by mouth every 8 (eight) hours.     Historical Provider, MD  nitroGLYCERIN (NITROSTAT) 0.4 MG SL tablet Place 0.4 mg under the tongue every 5 (five) minutes as needed. Chest pain    Historical Provider, MD  oxyCODONE (OXYCONTIN) 10 MG 12 hr tablet Take 10 mg by mouth every 12 (twelve) hours.    Historical Provider, MD  pantoprazole (PROTONIX) 40 MG tablet Take 40 mg by mouth daily.    Historical Provider, MD  polyethylene glycol (MIRALAX / GLYCOLAX) packet Take 17 g by mouth 2 (two) times daily.     Historical Provider, MD  traZODone (DESYREL) 50 MG tablet Take 50 mg by mouth at bedtime.    Historical Provider, MD  Vitamin D, Ergocalciferol, (DRISDOL) 50000 UNITS CAPS Take 50,000 Units by mouth every 7 (seven) days.    Historical Provider, MD  vitamin k 100 MCG tablet Take 100 mcg by mouth daily.    Historical Provider, MD    Labs: Basic Metabolic Panel:  Recent Labs Lab 01/11/13 0500 01/12/13 0500 01/13/13 0500 01/14/13 0500 01/15/13 0540 01/16/13 0500 01/17/13 0505  NA 134* 134* 137 138 137 137 134*  K 3.4* 3.8 3.4* 3.0* 3.7 4.0 3.4*  CL 83* 84* 84* 84* 85* 86* 85*  CO2 >45* 45* 45* 44* 45* 44* 41*  GLUCOSE 171* 139* 179* 98 116* 123* 132*  BUN 52* 54* 56* 56* 59* 65* 70*  CREATININE 2.10* 2.12* 2.14* 2.20* 2.52* 2.79* 2.87*  CALCIUM 9.9 10.2 10.0 10.3 9.8 9.6 9.6   Liver Function  Tests: No results found for this basename: AST, ALT, ALKPHOS, BILITOT, PROT, ALBUMIN,  in the last 168 hours No results found for this basename: LIPASE, AMYLASE,  in the last 168 hours No results found for this basename: AMMONIA,  in the last 168 hours CBC:  Recent Labs Lab 01/11/13 0500 01/13/13 0500 01/16/13 0500 01/17/13 0505  WBC  --  5.1 4.8 4.8  HGB 9.3* 9.3* 9.4* 9.1*  HCT 29.5* 29.5* 31.5* 29.9*  MCV  --  86.0 89.2 88.7  PLT  --  112* 130* 128*   PT/INR: @LABRCNTIP (inr:5) Cardiac Enzymes: )No results found for this basename: CKTOTAL, CKMB, CKMBINDEX, TROPONINI,  in the last 168 hours CBG:  Recent Labs Lab 01/16/13 1124 01/16/13 1636 01/16/13 2134 01/17/13 0417 01/17/13 0609  GLUCAP 136* 138* 175* 168* 133*     Physical Exam:  Blood pressure 102/67, pulse 91, temperature 98 F (36.7 C), temperature source Oral, resp. rate 18, height 5\' 3"  (1.6 m), weight 99.791 kg (220 lb), SpO2 100.00%.  Gen: fatigued pleasant and pale elderly female in no distress Skin: no rash, cyanosis HEENT:  EOMI, sclera anicteric, throat clear and moist Neck: ++ JVD, no LAN Chest: bilateral crackles at bases CV: regular, no rub or murmur noted, no carotid bruits, pedal pulses diminished Abdomen: soft, obese, nontender, no masses Ext: 2-3+ pitting edema bilat LE's, no joint effusion or deformity, no gangrene or ulceration Neuro: alert, Ox3, no focal deficit, generalized weakness, no asterixis   Impression/Plan 1. Acute on CKD IV- due to cardiorenal syndrome from severe RHF.  Improved temporarily with IV milrinone, dopamine and lasix.  Renal function is worsening off these medications;  not a dialysis candidate due to severe comorbidities.  Discussed with patient and family; would consider palliative care.  D/W primary MD and cardiology. 2. OHS/OSA, R HF with anasarca 3. HTN > 40 yrs 4. DM 27 yrs 5. Hx afib 6. Obesity   Vinson Moselle  MD [facility] [phone] pgr     [phone] cell 01/17/2013, 2:26 PM

## 2013-01-17 NOTE — Progress Notes (Signed)
Patient Name: Gina Ashley      SUBJECTIVE: Ms. Hammac is an 79 y.o. female with history of chronic atrial fibrillation on coumadin, diastolic heart failure and HTN. She also has DM type 2, COPD, OSA on CPAP and renal failure with baseline Cr 1.5-1.7. She has had 4 hospitalizations in the last 6 months, 1 included tibia/fibula fracture and the rest have been due to massive fluid overload in the setting of diastolic heart failure. She has required short term dialysis in the past for renal failure.  Echo with RV dysfunction and low urine output therefore started on milrinone 0.25 mcg/kg/min on 3/5. Had poor urine output so low dose dopamine added 3/7.  Overall 47 pound diuresis since admit. Yesterday lasix drip and dopamine stopped. She became hypotensive Friday  and received 500 cc. BP improved.     Denies SOB/PND/Orthopnea. Feels OK   Past Medical History  Diagnosis Date  . GERD (gastroesophageal reflux disease)   . Back pain, chronic   . Pain in shoulder   . Pain in ankle   . Hiatal hernia   . HTN (hypertension)     all her life  . CKD (chronic kidney disease), stage III     GFR: 38  . Atrial fibrillation     since 1996  . Sleep apnea   . Fall 3 weeks ago    was evaluated at [facility]  . OSA on CPAP   . Depression   . PONV (postoperative nausea and vomiting)     difficult to wake up  . Chronic diastolic heart failure 08/31/2012  . Tibia fracture     BILATERAL  . Renal failure     acute on chronic   . Fibula fracture     bilateral  . Respiratory failure     acute on chronic hx of  requiring BIPAP  . COPD (chronic obstructive pulmonary disease)   . Metabolic encephalopathy     hx of   . Renal failure     acute on chronic with need for intermittent dialysis - hx of   . UTI (urinary tract infection)     hx of   . Pleural effusion     right sided now resolved   . Diabetes mellitus     x 15 yrs, hx of hypoglycemia   . CHF (congestive heart failure)     diastolic  HF    PHYSICAL EXAM Filed Vitals:   01/16/13 1048 01/16/13 1353 01/16/13 2107 01/17/13 0359  BP: 112/67 95/61 113/62 123/69  Pulse: 77 79 76 63  Temp:  97.4 F (36.3 C) 97 F (36.1 C) 98 F (36.7 C)  TempSrc:  Oral Oral Oral  Resp: 18 18 18 18   Height:      Weight:    220 lb (99.791 kg)  SpO2: 96% 96% 98% 100%  Wt back up  Well developed and obese woman wearing  HENT normal Neck supple with JVP>10 I thinnk Carotids brisk and full without bruits Clear nut diominshed Regular rate and rhythm, 2/6 sys murmur Abd-soft with active BS without hepatomegaly No Clubbing cyanosis 2+edema Skin-warm and dry A & Oriented  Grossly normal sensory and motor function   TELEMETRY: Reviewed telemetry pt in     Intake/Output Summary (Last 24 hours) at 01/17/13 0749 Last data filed at 01/17/13 0200  Gross per 24 hour  Intake    640 ml  Output    625 ml  Net     15 ml  LABS: Basic Metabolic Panel:  Recent Labs Lab 01/11/13 0500 01/11/13 0810 01/12/13 0500 01/13/13 0500 01/14/13 0500 01/15/13 0540 01/16/13 0500 01/17/13 0505  NA 134*  --  134* 137 138 137 137 134*  K 3.4*  --  3.8 3.4* 3.0* 3.7 4.0 3.4*  CL 83*  --  84* 84* 84* 85* 86* 85*  CO2 >45*  --  45* 45* 44* 45* 44* 41*  GLUCOSE 171*  --  139* 179* 98 116* 123* 132*  BUN 52*  --  54* 56* 56* 59* 65* 70*  CREATININE 2.10*  --  2.12* 2.14* 2.20* 2.52* 2.79* 2.87*  CALCIUM 9.9  --  10.2 10.0 10.3 9.8 9.6 9.6  MG 2.0 1.9  --   --   --   --   --   --    Cardiac Enzymes: No results found for this basename: CKTOTAL, CKMB, CKMBINDEX, TROPONINI,  in the last 72 hours CBC:  Recent Labs Lab 01/11/13 0500 01/13/13 0500 01/16/13 0500 01/17/13 0505  WBC  --  5.1 4.8 4.8  HGB 9.3* 9.3* 9.4* 9.1*  HCT 29.5* 29.5* 31.5* 29.9*  MCV  --  86.0 89.2 88.7  PLT  --  112* 130* 128*   PROTIME:  Recent Labs  01/15/13 0540 01/17/13 0505  LABPROT 24.9* 28.5*  INR 2.38* 2.86*   Liver Function Tests: No results found  for this basename: AST, ALT, ALKPHOS, BILITOT, PROT, ALBUMIN,  in the last 72 hours No results found for this basename: LIPASE, AMYLASE,  in the last 72 hours BNP: BNP (last 3 results)  Recent Labs  01/03/13 0800  PROBNP 4706.0*   D-Dimer: No results found for this basename: DDIMER,  in the last 72 hours Hemoglobin A1C: No results found for this basename: HGBA1C,  in the last 72 hours   ASSESSMENT AND PLAN:  Principal Problem:   Shortness of breath Active Problems:     PAFlutter   COPD (chronic obstructive pulmonary disease)     CKD (chronic kidney disease)   Chronic diastolic heart failure   Obesity (BMI 30-39.9)       Cardiorenal syndrome with renal failure     Acute on chronic diastolic heart failure  Renal function is worsening. This despite holding diuretics. Her weight is up about 1 kg and urine output is down.  BP are reasonable as is O2 sat.  This is ominous and while i note that renal as said not a candidate for RRT, their input may be valuable.  Tracking the Cr the last week or so, there was no improvement with the use of either milrinone or DA although diuresis was successful, and Cr did not rise.  Will resume lasix w trepidation and ask renal to see  It may be worth attempting cardioversion for atrial flutter. This may help augment cardiac performance especially with her diastolic heart failure. INR therapeutic  Will defer to CHF team in am but will make NPO   Resume milrinone and dopamine today; replete K  Reviewed with family  Signed, Sherryl Manges MD  01/17/2013

## 2013-01-18 LAB — GLUCOSE, CAPILLARY
Glucose-Capillary: 112 mg/dL — ABNORMAL HIGH (ref 70–99)
Glucose-Capillary: 122 mg/dL — ABNORMAL HIGH (ref 70–99)

## 2013-01-18 LAB — PROTIME-INR
INR: 2.51 — ABNORMAL HIGH (ref 0.00–1.49)
Prothrombin Time: 25.9 seconds — ABNORMAL HIGH (ref 11.6–15.2)

## 2013-01-18 LAB — BASIC METABOLIC PANEL
CO2: 42 mEq/L (ref 19–32)
Calcium: 10 mg/dL (ref 8.4–10.5)
Chloride: 87 mEq/L — ABNORMAL LOW (ref 96–112)
Creatinine, Ser: 2.62 mg/dL — ABNORMAL HIGH (ref 0.50–1.10)
GFR calc Af Amer: 18 mL/min — ABNORMAL LOW (ref >90)
Sodium: 136 mEq/L (ref 135–145)

## 2013-01-18 LAB — CBC
Hemoglobin: 9.6 g/dL — ABNORMAL LOW (ref 12.0–15.0)
MCH: 27.3 pg (ref 26.0–34.0)
MCHC: 31.8 g/dL (ref 30.0–36.0)
Platelets: 139 10*3/uL — ABNORMAL LOW (ref 150–400)
RBC: 3.52 MIL/uL — ABNORMAL LOW (ref 3.87–5.11)

## 2013-01-18 MED ORDER — CARBAMIDE PEROXIDE 6.5 % OT SOLN
5.0000 [drp] | Freq: Two times a day (BID) | OTIC | Status: AC
  Administered 2013-01-18 – 2013-01-20 (×6): 5 [drp] via OTIC
  Filled 2013-01-18: qty 15

## 2013-01-18 MED ORDER — SODIUM CHLORIDE 0.9 % IJ SOLN
3.0000 mL | Freq: Two times a day (BID) | INTRAMUSCULAR | Status: DC
  Administered 2013-01-18 – 2013-01-22 (×6): 3 mL via INTRAVENOUS

## 2013-01-18 MED ORDER — OXYCODONE HCL 5 MG PO TABS
10.0000 mg | ORAL_TABLET | ORAL | Status: DC | PRN
  Administered 2013-01-18 – 2013-01-23 (×16): 10 mg via ORAL
  Filled 2013-01-18 (×16): qty 2

## 2013-01-18 MED ORDER — SODIUM CHLORIDE 0.9 % IV SOLN
INTRAVENOUS | Status: DC
  Administered 2013-01-19: 05:00:00 via INTRAVENOUS

## 2013-01-18 MED ORDER — FUROSEMIDE 10 MG/ML IJ SOLN
80.0000 mg | Freq: Three times a day (TID) | INTRAMUSCULAR | Status: AC
  Administered 2013-01-18 – 2013-01-19 (×3): 80 mg via INTRAVENOUS
  Filled 2013-01-18 (×3): qty 8

## 2013-01-18 MED ORDER — SODIUM CHLORIDE 0.9 % IJ SOLN
3.0000 mL | INTRAMUSCULAR | Status: DC | PRN
  Administered 2013-01-19: 3 mL via INTRAVENOUS

## 2013-01-18 MED ORDER — POTASSIUM CHLORIDE CRYS ER 20 MEQ PO TBCR
40.0000 meq | EXTENDED_RELEASE_TABLET | Freq: Once | ORAL | Status: AC
  Administered 2013-01-18: 40 meq via ORAL
  Filled 2013-01-18: qty 2

## 2013-01-18 MED ORDER — AMIODARONE HCL 200 MG PO TABS
400.0000 mg | ORAL_TABLET | Freq: Two times a day (BID) | ORAL | Status: DC
  Administered 2013-01-18 – 2013-01-19 (×4): 400 mg via ORAL
  Filled 2013-01-18 (×6): qty 2

## 2013-01-18 MED ORDER — SODIUM CHLORIDE 0.9 % IV SOLN
250.0000 mL | INTRAVENOUS | Status: DC
  Administered 2013-01-18 – 2013-01-21 (×4): 250 mL via INTRAVENOUS

## 2013-01-18 MED ORDER — TORSEMIDE 20 MG PO TABS
60.0000 mg | ORAL_TABLET | Freq: Every day | ORAL | Status: DC
  Filled 2013-01-18: qty 3

## 2013-01-18 NOTE — Progress Notes (Signed)
Advanced Heart Failure Rounding Note   Subjective:    Gina Ashley is an 79 y.o. female with history of chronic atrial fibrillation on coumadin, diastolic heart failure and HTN. She also has DM type 2, COPD, OSA on CPAP and renal failure with baseline Cr 1.5-1.7. She has had 4 hospitalizations in the last 6 months, 1 included tibia/fibula fracture and the rest have been due to massive fluid overload in the setting of diastolic heart failure. She has required short term dialysis in the past for renal failure.   Echo with RV dysfunction and low urine output therefore started on milrinone 0.25 mcg/kg/min on 3/5. Had poor urine output so low dose dopamine added 3/7.    Overall 42 pound diuresis since admit.  Restarted on milrinone and dopamine over the weekend due rise in Cr 2.87.   Cr improving 2.62 today (baseline 1.8-2).    Denies SOB/PND/Orthopnea. C/o ringing in ears and pain in rt ear.     Objective:    Vital Signs:   Temp:  [97.6 F (36.4 C)-97.9 F (36.6 C)] 97.6 F (36.4 C) (03/17 0453) Pulse Rate:  [91-108] 95 (03/17 0453) Resp:  [18] 18 (03/17 0453) BP: (102-138)/(58-82) 135/58 mmHg (03/17 0453) SpO2:  [93 %-95 %] 95 % (03/17 0453) Weight:  [220 lb 0.3 oz (99.8 kg)] 220 lb 0.3 oz (99.8 kg) (03/17 0453) Last BM Date: 01/17/13  Weight change: Filed Weights   01/16/13 0640 01/17/13 0359 01/18/13 0453  Weight: 217 lb 6 oz (98.6 kg) 220 lb (99.791 kg) 220 lb 0.3 oz (99.8 kg)    Intake/Output:   Intake/Output Summary (Last 24 hours) at 01/18/13 0839 Last data filed at 01/18/13 0828  Gross per 24 hour  Intake    750 ml  Output   4000 ml  Net  -3250 ml     Physical Exam: General: Chronically ill appearing in chair. No resp difficulty on n/c  HEENT: normal  Neck: supple. JVP 7-8. Carotids 2+ bilat; no bruits. No lymphadenopathy or thryomegaly appreciated.  Cor: Distant. Irregular. No rubs, gallops or murmurs.  Lungs: decreased throughout  Abdomen: obese, soft,  nontender, distention much improved. Good bowel sounds.  Extremities: no cyanosis, clubbing, rash, tr- 1+edema. Well healing scar left proximal tibia RUE PICC Neuro: alert & orientedx3, cranial nerves grossly intact. moves all 4 extremities w/o difficulty. Affect pleasant   Telemetry: atrial flutter 90-110  Labs: Basic Metabolic Panel:  Recent Labs Lab 01/14/13 0500 01/15/13 0540 01/16/13 0500 01/17/13 0505 01/18/13 0500  NA 138 137 137 134* 136  K 3.0* 3.7 4.0 3.4* 3.5  CL 84* 85* 86* 85* 87*  CO2 44* 45* 44* 41* 42*  GLUCOSE 98 116* 123* 132* 116*  BUN 56* 59* 65* 70* 75*  CREATININE 2.20* 2.52* 2.79* 2.87* 2.62*  CALCIUM 10.3 9.8 9.6 9.6 10.0    Liver Function Tests: No results found for this basename: AST, ALT, ALKPHOS, BILITOT, PROT, ALBUMIN,  in the last 168 hours No results found for this basename: LIPASE, AMYLASE,  in the last 168 hours No results found for this basename: AMMONIA,  in the last 168 hours  CBC:  Recent Labs Lab 01/13/13 0500 01/16/13 0500 01/17/13 0505 01/18/13 0500  WBC 5.1 4.8 4.8 5.8  HGB 9.3* 9.4* 9.1* 9.6*  HCT 29.5* 31.5* 29.9* 30.2*  MCV 86.0 89.2 88.7 85.8  PLT 112* 130* 128* 139*    Cardiac Enzymes: No results found for this basename: CKTOTAL, CKMB, CKMBINDEX, TROPONINI,  in the last   168 hours  BNP: BNP (last 3 results)  Recent Labs  01/03/13 0800  PROBNP 4706.0*      Imaging: No results found.   Medications:     Scheduled Medications: . allopurinol  100 mg Oral Daily  . amiodarone  200 mg Oral BID  . busPIRone  5 mg Oral Q12H  . carvedilol  12.5 mg Oral BID WC  . ciprofloxacin  250 mg Oral BID  . docusate sodium  100 mg Oral Q8H  . feeding supplement (NEPRO CARB STEADY)  237 mL Oral BID BM  . guaiFENesin  600 mg Oral BID  . insulin aspart  0-5 Units Subcutaneous QHS  . insulin aspart  0-9 Units Subcutaneous TID WC  . insulin glargine  25 Units Subcutaneous QHS  . isosorbide-hydrALAZINE  1 tablet Oral BID   . metroNIDAZOLE  500 mg Oral Q8H  . miconazole  1 Applicatorful Vaginal QHS  . OxyCODONE  10 mg Oral Q12H  . pantoprazole  40 mg Oral Daily  . polyethylene glycol  17 g Oral BID  . potassium chloride  40 mEq Oral BID  . torsemide  60 mg Oral Daily  . traZODone  50 mg Oral QHS  . warfarin  3 mg Oral q1800  . Warfarin - Pharmacist Dosing Inpatient   Does not apply q1800    Infusions: . DOPamine 2.5 mcg/kg/min (01/17/13 1005)    PRN Medications: acetaminophen, albuterol, alum & mag hydroxide-simeth, diphenhydrAMINE, lactulose, nitroGLYCERIN, ondansetron (ZOFRAN) IV, oxyCODONE, simethicone, sodium chloride   Assessment:   1. Acute on chronic right sided heart failure  2. Acute on chronic respiratory failure  3. Acute on chronic renal failure.  4. HTN  5. Obesity, suspect OHS/OSA  6. Chronic atrial flutter  - on coumadin  7. Deconditioning 8. Metabolic alkalosis 9. Hypokalemia 10. Hypotension, resolved  Plan/Discussion:    Weight is fairly stable.  Diuresed well.  Back on milrinone and dopamine and renal function improving.  She is still volume overloaded on exam.  Will cut off milrinone today in preparation for possible DCCV tomorrow.  Continue dopamine and treat with Lasix 80 mg IV every 8 hrs today.  Follow creatinine closely with cardiorenal syndrome.   AF is chronic on amio for rate control.  She has been in atrial fibrillation at least since 10/13 but not sure how long before. Agree with Dr. Klein that given her CHF, would be reasonable to try to keep her in NSR.    - Increase amiodarone to 400 mg bid today - Will plan on stopping dopamine tomorrow am with TEE-guided DCCV attempt after that.   PT/OT following.   Gina Ashley 01/18/2013 8:43 AM Length of Stay: 16      

## 2013-01-18 NOTE — Progress Notes (Signed)
[facility] PROGRESS NOTE  Gina Ashley MWN:[id number] DOB: August 06, 1927 DOA: 01/02/2013 PCP: Ernestine Conrad, MD  Assessment/Plan: Principal Problem:   Shortness of breath Active Problems:   DM   OBSTRUCTIVE SLEEP APNEA   HYPERTENSION   GERD   Persistent Atrial flutter   COPD (chronic obstructive pulmonary disease)   Anemia due to chronic illness   CKD (chronic kidney disease)   Chronic diastolic heart failure   Obesity (BMI 30-39.9)   Depression   Cardiorenal syndrome with renal failure   Hypokalemia   Acute on chronic diastolic heart failure   Enteritis due to Clostridium difficile    1. Acute right heart congestive heart failure: Patient presented with worsening edema, increasing abdominal girth and SOB, despite iv Lasix. Clinically, she was found to be anasarcic and felt to have acute on chronic right sided HF and marked volume overload on exam. Dr Marca Ancona provided cardiology consultation, reviewed 2D echocardiogram, which showed poorly visualized moderately dilated RV with moderately decreased systolic function and mildly left-shifted IV septum. Patient was initially managed with ivi Milrinone, as well as aggressive diuresis. Dr Reuel Boom Bensimhon/Heart Failure team followed subsequently, and were extremely helpful in rationalizing heart failure treatment. Clinically, patient has responded satisfactorily to treatment, and as of 01/15/13, was feeling considerably better. Diuretics were discontinued on 01/14/13, due to hypotension, and patient administered a bolus of NS. BP is satisfactory at this time. Diuretics were placed on hold till 01/17/13, but as creatinine has failed to stabilize, and weight is going up, Dr Graciela Husbands on 01/17/13 restarted Milrinone/Dopamine. Dr Delano Metz was re-consulted on 01/17/13, for possible HD, but has opined that patient is not a suitable candidate, due to her severe co-morbidities. Cardiology plans DCCV on 01/19/13.  Managing as recommended.  2. Acute  on chronic kidney disease stage 3/cardio-renal syndrome: Patient's baseline creatinine was 1.14 on 09/15/12. Creatinine was 1.65 at presentation, and gradually climbed, reaching a high of 2.20 on 01/14/13. GFR now in CKD stage 4 range. In the initial few days of hospitalization, she was managed with ivi low dose Dopamine. This was discontinued on 01/14/13. Following renal indices. Nephrology consultation was provided by Dr Delano Metz, and he was again re-consulted on 01/17/13. Patient is considered a poor candidate for RRT, due to her co-morbidities. The discussion in #1 above is pertinent. Creatinine has plateaued today.  3. PAF (paroxysmal atrial fibrillation), was in SR 06/2012: Patient has a known history of chronic atrial fibrillation/flutter and has failed DCCV in the past. Managed with Coreg and Amiodarone, with satisfactory rate control. On Coumadin per pharmacy, and INR remains therapeutic. As discussed above, repeat DCCV is planned for 01/19/13.   4. Hypokalemia: Likely due to diuretics. Repleted as indicated. Following Lytes.  5. DM with complications (CKD), controlled: Hemoglobin A1c on this admission 6.8. Managing with diet, SSI and Lantus, with reasonable control.   6. HTN: Patient has a known history of HTN, but during this hospitalization, BP has been borderline to low. On Bidil and Coreg (dose lowered this admit, secondary to hypotension), with holding parameters orders. Patient appears to be tolerating borderline BP well.   7. Depression: Stable on Buspirone.  8. Anemia: Patient has a mild normocytic anemia, due to chronic illness. HB has remained stable/reasonable.  9. OSA: Known history of OSA/OHS: Managing with nocturnal CPAP, and appear stable from this view point as of 01/15/13, although as discussed in #1, she presented with right heart failure. 10. Physical deconditioning: Patient is significantly deconditioned, due to multiple co-morbidites, multiple  hospitalizations over past several  months and recent acute illness. PT/OT following. Plan to [facility], when stable.   11. C. Difficile infection: Patient had a brief episode of diarrhea on 01/05/13. C.Difficile PCR is positive. She has been commenced on a 10-day course of Flagyl, effective 01/16/13 now day #3.  12. UTI: U/A of 01/16/13, demonstrated significant pyuria and bacteriuria. Culture is positive for GNR. On 3-day course of Ciprofloxacin (avoiding long course, due to C. Difficile infection), now day#3. 13: Right ear discomfort: Patient complained of discomfort in her right ear today. Otoscopic examination revealed small amount of impacted cerumen. Debrox ordered.     Code Status: Limited code blue. No CPR or intubation but OK with anti-arrhythmic meds, defib/cardiovert and vasoactive drugs.  Family Communication:  Disposition Plan: To be determined.    Brief narrative: 79 y.o. female with history of GERD, chronic back pain, Hiatal hernia, HTN, CKD stage III; Atrial fibrillation, OSA on CPAP, Depression, Chronic diastolic heart failure (08/31/2012), Tibia/Fibula fracture s/p surgical repair with post-op course complicated by ARF, requiring temporary HD, COPD, Obesity Hypoventilation syndrome, chronic respiratory failure. Following her last hospitalization, she was discharged to [facility] and has been living there since then. Family states that 1 month ago she had her perma-cath was discontinued. For the past 6 days, she has had worsening edema with increasing abdominal girth and SOB, despite iv Lasix. She was sent to [facility] ED for further treatment, and was admitted.   Consultants:  Dr Arvilla Meres, cardiologist.  Dr dalton Shirlee Latch, cardiologist.   Dr Johny Sax, ID.  Dr Delano Metz, nephrologist.   Procedures:  CXRs  2D Echocardiogram     Antibiotics:  N/A.   HPI/Subjective: Right ear discomfort  Objective: Vital signs in last 24 hours: Temp:  [97.6 F (36.4 C)-97.9 F (36.6 C)] 97.6 F  (36.4 C) (03/17 0453) Pulse Rate:  [95-109] 109 (03/17 0854) Resp:  [18] 18 (03/17 0453) BP: (111-138)/(58-82) 126/68 mmHg (03/17 0854) SpO2:  [93 %-95 %] 95 % (03/17 0453) Weight:  [99.8 kg (220 lb 0.3 oz)] 99.8 kg (220 lb 0.3 oz) (03/17 0453) Weight change: 0.009 kg (0.3 oz) Last BM Date: 01/17/13  Intake/Output from previous day: 03/16 0701 - 03/17 0700 In: 750 [P.O.:750] Out: 4000 [Urine:4000]     Physical Exam: General: Comfortable, alert, communicative, fully oriented, not short of breath at rest.  HEENT:  Mild clinical pallor, no jaundice, no conjunctival injection or discharge. Otoscopic examination revealed small amount of impacted cerumen bilaterally.  NECK:  Supple, JVP not seen, no carotid bruits, no palpable lymphadenopathy, no palpable goiter. CHEST:  Fine crackles right base. HEART:  Sounds 1 and 2 heard, normal, regular, no murmurs. ABDOMEN:  Obese, soft, non-tender, no palpable organomegaly, no palpable masses, normal bowel sounds. GENITALIA:  Not examined. LOWER EXTREMITIES:  Moderate pitting edema, bilateral LLE.  MUSCULOSKELETAL SYSTEM:  Generalized osteoarthritic changes, otherwise, normal. CENTRAL NERVOUS SYSTEM:  No focal neurologic deficit on gross examination.  Lab Results:  Recent Labs  01/17/13 0505 01/18/13 0500  WBC 4.8 5.8  HGB 9.1* 9.6*  HCT 29.9* 30.2*  PLT 128* 139*    Recent Labs  01/17/13 0505 01/18/13 0500  NA 134* 136  K 3.4* 3.5  CL 85* 87*  CO2 41* 42*  GLUCOSE 132* 116*  BUN 70* 75*  CREATININE 2.87* 2.62*  CALCIUM 9.6 10.0   Recent Results (from the past 240 hour(s))  URINE CULTURE     Status: None   Collection Time  01/16/13  2:30 AM      Result Value Range Status   Specimen Description URINE, CATHETERIZED   Final   Special Requests CX ADDED AT 0324 ON 161096   Final   Culture  Setup Time 01/16/2013 11:18   Final   Colony Count >=100,000 COLONIES/ML   Final   Culture GRAM NEGATIVE RODS   Final   Report Status  PENDING   Incomplete  CLOSTRIDIUM DIFFICILE BY PCR     Status: Abnormal   Collection Time    01/16/13  3:35 AM      Result Value Range Status   C difficile by pcr POSITIVE (*) NEGATIVE Final   Comment: CRITICAL RESULT CALLED TO, READ BACK BY AND VERIFIED WITH:     HANEY R.,RN 01/16/13 0842 BY JONESJ     Studies/Results: No results found.  Medications: Scheduled Meds: . allopurinol  100 mg Oral Daily  . amiodarone  400 mg Oral BID  . busPIRone  5 mg Oral Q12H  . carvedilol  12.5 mg Oral BID WC  . ciprofloxacin  250 mg Oral BID  . docusate sodium  100 mg Oral Q8H  . feeding supplement (NEPRO CARB STEADY)  237 mL Oral BID BM  . furosemide  80 mg Intravenous Q8H  . guaiFENesin  600 mg Oral BID  . insulin aspart  0-5 Units Subcutaneous QHS  . insulin aspart  0-9 Units Subcutaneous TID WC  . insulin glargine  25 Units Subcutaneous QHS  . isosorbide-hydrALAZINE  1 tablet Oral BID  . metroNIDAZOLE  500 mg Oral Q8H  . miconazole  1 Applicatorful Vaginal QHS  . OxyCODONE  10 mg Oral Q12H  . pantoprazole  40 mg Oral Daily  . polyethylene glycol  17 g Oral BID  . potassium chloride  40 mEq Oral Once  . traZODone  50 mg Oral QHS  . warfarin  3 mg Oral q1800  . Warfarin - Pharmacist Dosing Inpatient   Does not apply q1800   Continuous Infusions: . DOPamine 2.5 mcg/kg/min (01/17/13 1005)   PRN Meds:.acetaminophen, albuterol, alum & mag hydroxide-simeth, diphenhydrAMINE, lactulose, nitroGLYCERIN, ondansetron (ZOFRAN) IV, oxyCODONE, simethicone, sodium chloride    LOS: 16 days   Colette Dicamillo,CHRISTOPHER  [facility] Pager [phone]. If 8PM-8AM, please contact night-coverage at [email or web address], password [facility] 01/18/2013, 12:15 PM  LOS: 16 days

## 2013-01-18 NOTE — Progress Notes (Signed)
ANTICOAGULATION CONSULT NOTE - Follow Up Consult  Pharmacy Consult for Coumadin Indication: atrial fibrillation  Weight: 99.8 kg  BP 126/68  Pulse 109  Temp(Src) 97.6 F (36.4 C) (Oral)  Resp 18  Ht 5\' 3"  (1.6 m)  Wt 220 lb 0.3 oz (99.8 kg)  BMI 38.98 kg/m2  SpO2 95%   Labs:  Recent Labs  01/16/13 0500 01/17/13 0505 01/18/13 0500  HGB 9.4* 9.1* 9.6*  HCT 31.5* 29.9* 30.2*  PLT 130* 128* 139*  LABPROT  --  28.5* 25.9*  INR  --  2.86* 2.51*  CREATININE 2.79* 2.87* 2.62*   Lab Results  Component Value Date   INR 2.51* 01/18/2013   INR 2.86* 01/17/2013   INR 2.38* 01/15/2013    Medications:  Scheduled:  . allopurinol  100 mg Oral Daily  . amiodarone  400 mg Oral BID  . busPIRone  5 mg Oral Q12H  . carvedilol  12.5 mg Oral BID WC  . ciprofloxacin  250 mg Oral BID  . docusate sodium  100 mg Oral Q8H  . feeding supplement (NEPRO CARB STEADY)  237 mL Oral BID BM  . [COMPLETED] furosemide  160 mg Intravenous BID  . furosemide  80 mg Intravenous Q8H  . guaiFENesin  600 mg Oral BID  . insulin aspart  0-5 Units Subcutaneous QHS  . insulin aspart  0-9 Units Subcutaneous TID WC  . insulin glargine  25 Units Subcutaneous QHS  . isosorbide-hydrALAZINE  1 tablet Oral BID  . metroNIDAZOLE  500 mg Oral Q8H  . miconazole  1 Applicatorful Vaginal QHS  . OxyCODONE  10 mg Oral Q12H  . pantoprazole  40 mg Oral Daily  . polyethylene glycol  17 g Oral BID  . potassium chloride  40 mEq Oral BID  . potassium chloride  40 mEq Oral Once  . traZODone  50 mg Oral QHS  . warfarin  3 mg Oral q1800  . Warfarin - Pharmacist Dosing Inpatient   Does not apply q1800   Anti-infectives   Start     Dose/Rate Route Frequency Ordered Stop   01/16/13 2000  ciprofloxacin (CIPRO) tablet 250 mg     250 mg Oral 2 times daily 01/16/13 1655 01/19/13 1959   01/16/13 0900  metroNIDAZOLE (FLAGYL) tablet 500 mg     500 mg Oral 3 times per day 01/16/13 0847        Assessment:  79 y/o female on  chronic Coumadin for atrial fibrillation.  Patient on Metronidazole, Cipro, and Amiodarone all of which are known to interact with Coumadin with likely increases in INR response.  INR 2.51 today with No bleeding complications noted   Goal of Therapy:  INR 2-3    Plan:  Continue Coumadin 3 mg daily.  Daily INR's, CBC.    Teran Knittle, Deetta Perla.D 01/18/2013, 9:00 AM

## 2013-01-18 NOTE — Progress Notes (Signed)
[facility] ROUNDING NOTE   Subjective:   Interval History: comfortable no complaints  Objective:  Vital signs in last 24 hours:  Temp:  [97.6 F (36.4 C)-97.9 F (36.6 C)] 97.6 F (36.4 C) (03/17 0453) Pulse Rate:  [95-109] 109 (03/17 0854) Resp:  [18] 18 (03/17 0453) BP: (111-138)/(58-82) 126/68 mmHg (03/17 0854) SpO2:  [93 %-95 %] 95 % (03/17 0453) Weight:  [99.8 kg (220 lb 0.3 oz)] 99.8 kg (220 lb 0.3 oz) (03/17 0453)  Weight change: 0.009 kg (0.3 oz) Filed Weights   01/16/13 0640 01/17/13 0359 01/18/13 0453  Weight: 98.6 kg (217 lb 6 oz) 99.791 kg (220 lb) 99.8 kg (220 lb 0.3 oz)    Intake/Output: I/O last 3 completed shifts: In: 950 [P.O.:950] Out: 4300 [Urine:4300]   Intake/Output this shift:     Gen: fatigued pleasant and pale elderly female in no distress  Skin: no rash, cyanosis  HEENT: EOMI, sclera anicteric, throat clear and moist  Neck: ++ JVD, no LAN  Chest: bilateral crackles at bases  CV: regular, no rub or murmur noted, no carotid bruits, pedal pulses diminished  Abdomen: soft, obese, nontender, no masses  Ext: 2-3+ pitting edema bilat LE's, no joint effusion or deformity, no gangrene or ulceration  Neuro: alert, Ox3, no focal deficit, generalized weakness, no asterixis    Basic Metabolic Panel:  Recent Labs Lab 01/14/13 0500 01/15/13 0540 01/16/13 0500 01/17/13 0505 01/18/13 0500  NA 138 137 137 134* 136  K 3.0* 3.7 4.0 3.4* 3.5  CL 84* 85* 86* 85* 87*  CO2 44* 45* 44* 41* 42*  GLUCOSE 98 116* 123* 132* 116*  BUN 56* 59* 65* 70* 75*  CREATININE 2.20* 2.52* 2.79* 2.87* 2.62*  CALCIUM 10.3 9.8 9.6 9.6 10.0    Liver Function Tests: No results found for this basename: AST, ALT, ALKPHOS, BILITOT, PROT, ALBUMIN,  in the last 168 hours No results found for this basename: LIPASE, AMYLASE,  in the last 168 hours No results found for this basename: AMMONIA,  in the last 168 hours  CBC:  Recent Labs Lab 01/13/13 0500  01/16/13 0500 01/17/13 0505 01/18/13 0500  WBC 5.1 4.8 4.8 5.8  HGB 9.3* 9.4* 9.1* 9.6*  HCT 29.5* 31.5* 29.9* 30.2*  MCV 86.0 89.2 88.7 85.8  PLT 112* 130* 128* 139*    Cardiac Enzymes: No results found for this basename: CKTOTAL, CKMB, CKMBINDEX, TROPONINI,  in the last 168 hours  BNP: No components found with this basename: POCBNP,   CBG:  Recent Labs Lab 01/17/13 1141 01/17/13 1651 01/17/13 2109 01/18/13 0444 01/18/13 0607  GLUCAP 139* 169* 289* 125* 112*    Microbiology: Results for orders placed during the hospital encounter of 01/02/13  MRSA PCR SCREENING     Status: None   Collection Time    01/03/13  1:03 AM      Result Value Range Status   MRSA by PCR NEGATIVE  NEGATIVE Final   Comment:            The GeneXpert MRSA Assay (FDA     approved for NASAL specimens     only), is one component of a     comprehensive MRSA colonization     surveillance program. It is not     intended to diagnose MRSA     infection nor to guide or     monitor treatment for     MRSA infections.  URINE CULTURE     Status: None  Collection Time    01/03/13  3:06 AM      Result Value Range Status   Specimen Description URINE, CATHETERIZED   Final   Special Requests ADDED 0337   Final   Culture  Setup Time 01/03/2013 03:54   Final   Colony Count >=100,000 COLONIES/ML   Final   Culture     Final   Value: KLEBSIELLA PNEUMONIAE     Note: THIS ISOLATE HAS BEEN CONFIRMED AS A [facility] CARBAPENEMASE PRODUCER COLISTIN 0.125 ETEST results for this drug are "FOR INVESTIGATIONAL USE ONLY" and should NOT be used for clinical purposes. CRITICAL RESULT CALLED TO, READ BACK BY AND VERIFIED WITH:      LAURA S (INFECTION CONTROL) ON 3.8.14 @ 1535 BY FERGK   Report Status 01/09/2013 FINAL   Final   Organism ID, Bacteria KLEBSIELLA PNEUMONIAE   Final  URINE CULTURE     Status: None   Collection Time    01/16/13  2:30 AM      Result Value Range Status   Specimen Description URINE, CATHETERIZED    Final   Special Requests CX ADDED AT 0324 ON [id number]   Final   Culture  Setup Time 01/16/2013 11:18   Final   Colony Count >=100,000 COLONIES/ML   Final   Culture GRAM NEGATIVE RODS   Final   Report Status PENDING   Incomplete  CLOSTRIDIUM DIFFICILE BY PCR     Status: Abnormal   Collection Time    01/16/13  3:35 AM      Result Value Range Status   C difficile by pcr POSITIVE (*) NEGATIVE Final   Comment: CRITICAL RESULT CALLED TO, READ BACK BY AND VERIFIED WITH:     HANEY R.,RN 01/16/13 0842 BY JONESJ    Coagulation Studies:  Recent Labs  01/17/13 0505 01/18/13 0500  LABPROT 28.5* 25.9*  INR 2.86* 2.51*    Urinalysis:  Recent Labs  01/16/13 0230  COLORURINE YELLOW  LABSPEC 1.015  PHURINE 7.0  GLUCOSEU NEGATIVE  HGBUR MODERATE*  BILIRUBINUR NEGATIVE  KETONESUR NEGATIVE  PROTEINUR NEGATIVE  UROBILINOGEN 1.0  NITRITE NEGATIVE  LEUKOCYTESUR LARGE*      Imaging: No results found.   Medications:   . DOPamine 2.5 mcg/kg/min (01/17/13 1005)   . allopurinol  100 mg Oral Daily  . amiodarone  400 mg Oral BID  . busPIRone  5 mg Oral Q12H  . carvedilol  12.5 mg Oral BID WC  . ciprofloxacin  250 mg Oral BID  . docusate sodium  100 mg Oral Q8H  . feeding supplement (NEPRO CARB STEADY)  237 mL Oral BID BM  . furosemide  80 mg Intravenous Q8H  . guaiFENesin  600 mg Oral BID  . insulin aspart  0-5 Units Subcutaneous QHS  . insulin aspart  0-9 Units Subcutaneous TID WC  . insulin glargine  25 Units Subcutaneous QHS  . isosorbide-hydrALAZINE  1 tablet Oral BID  . metroNIDAZOLE  500 mg Oral Q8H  . miconazole  1 Applicatorful Vaginal QHS  . OxyCODONE  10 mg Oral Q12H  . pantoprazole  40 mg Oral Daily  . polyethylene glycol  17 g Oral BID  . potassium chloride  40 mEq Oral Once  . traZODone  50 mg Oral QHS  . warfarin  3 mg Oral q1800  . Warfarin - Pharmacist Dosing Inpatient   Does not apply q1800   acetaminophen, albuterol, alum & mag hydroxide-simeth,  diphenhydrAMINE, lactulose, nitroGLYCERIN, ondansetron (ZOFRAN) IV, oxyCODONE, simethicone, sodium chloride  Assessment/ Plan:  1. Acute on CKD IV- due to cardiorenal syndrome from severe RHF. Improved temporarily with IV milrinone, dopamine and lasix.  not a dialysis candidate due to severe comorbidities. Discussed with patient and family; would consider palliative care.  2. OHS/OSA, R HF with anasarca 3. HTN > 40 yrs 4. DM 25 yrs 5. Hx afib 6. Obesity  Will sign off no further recommendations     LOS: 16 Ruchy Wildrick W @TODAY @10 :42 AM

## 2013-01-19 ENCOUNTER — Encounter (HOSPITAL_COMMUNITY)
Admission: EM | Disposition: A | Discharge status: Skilled Nursing Facility | Payer: Self-pay | Source: Home / Self Care | Attending: Internal Medicine

## 2013-01-19 ENCOUNTER — Encounter (HOSPITAL_COMMUNITY): Payer: Self-pay | Admitting: *Deleted

## 2013-01-19 DIAGNOSIS — I059 Rheumatic mitral valve disease, unspecified: Secondary | ICD-10-CM

## 2013-01-19 HISTORY — PX: TEE WITHOUT CARDIOVERSION: SHX5443

## 2013-01-19 LAB — CBC
HCT: 31.5 % — ABNORMAL LOW (ref 36.0–46.0)
MCH: 27.3 pg (ref 26.0–34.0)
MCV: 87 fL (ref 78.0–100.0)
RBC: 3.62 MIL/uL — ABNORMAL LOW (ref 3.87–5.11)
RDW: 15.9 % — ABNORMAL HIGH (ref 11.5–15.5)
WBC: 5.3 10*3/uL (ref 4.0–10.5)

## 2013-01-19 LAB — BASIC METABOLIC PANEL
CO2: 41 mEq/L (ref 19–32)
Chloride: 89 mEq/L — ABNORMAL LOW (ref 96–112)
Creatinine, Ser: 2.58 mg/dL — ABNORMAL HIGH (ref 0.50–1.10)
Glucose, Bld: 81 mg/dL (ref 70–99)

## 2013-01-19 LAB — GLUCOSE, CAPILLARY
Glucose-Capillary: 81 mg/dL (ref 70–99)
Glucose-Capillary: 52 mg/dL — ABNORMAL LOW (ref 70–99)

## 2013-01-19 SURGERY — ECHOCARDIOGRAM, TRANSESOPHAGEAL
Anesthesia: Moderate Sedation

## 2013-01-19 MED ORDER — MIDAZOLAM HCL 10 MG/2ML IJ SOLN
INTRAMUSCULAR | Status: DC | PRN
  Administered 2013-01-19: 2 mg via INTRAVENOUS
  Administered 2013-01-19: 1 mg via INTRAVENOUS

## 2013-01-19 MED ORDER — FENTANYL CITRATE 0.05 MG/ML IJ SOLN
INTRAMUSCULAR | Status: AC
  Filled 2013-01-19: qty 2

## 2013-01-19 MED ORDER — POTASSIUM CHLORIDE CRYS ER 20 MEQ PO TBCR
40.0000 meq | EXTENDED_RELEASE_TABLET | Freq: Two times a day (BID) | ORAL | Status: AC
  Administered 2013-01-19 (×2): 40 meq via ORAL
  Filled 2013-01-19 (×2): qty 2

## 2013-01-19 MED ORDER — POTASSIUM CHLORIDE CRYS ER 20 MEQ PO TBCR
EXTENDED_RELEASE_TABLET | ORAL | Status: AC
  Filled 2013-01-19: qty 2

## 2013-01-19 MED ORDER — BUTAMBEN-TETRACAINE-BENZOCAINE 2-2-14 % EX AERO
INHALATION_SPRAY | CUTANEOUS | Status: DC | PRN
  Administered 2013-01-19: 1 via TOPICAL

## 2013-01-19 MED ORDER — MIDAZOLAM HCL 5 MG/ML IJ SOLN
INTRAMUSCULAR | Status: AC
  Filled 2013-01-19: qty 2

## 2013-01-19 NOTE — Progress Notes (Signed)
Called by nursing staff for chest pain.  Upon arrival patient states she felt fullness in chest that has moved to her abdomen and now is sitting in her lower abdomen.  She states she has not had a bowel movement in 3-4 days and feels that she needs to go.  EKG reviewed with atrial flutter 89 bpm and unchanged from previous.  Patient given simethicone and prune juice.  Will continue to monitor.  No further ischemic work up as appears pain was related to gas.    Robbi Garter, PAC 4:10 PM

## 2013-01-19 NOTE — Progress Notes (Signed)
Patient complained of having chest pain rating a 3/10. Eyes were closed and fist clenched at chest. She stated that it started in her chest and then moved to her belly. EKG was performed and patient was currently on 2L BP is 109/67 and HR is 76. PA notified. No new orders given. PRN Simethicone was given Will continue to monitor patient for further changes in condition.

## 2013-01-19 NOTE — Progress Notes (Signed)
PT Cancellation Note  Patient Details Name: Gina Ashley MRN: [id number] DOB: May 19, 1927   Cancelled Treatment:    Reason Eval/Treat Not Completed: Patient at procedure or test/unavailable. At TEE. Will f/u as time and schedule allows.   [facility] HELEN 01/19/2013, 11:02 AM Pager: [phone]

## 2013-01-19 NOTE — Progress Notes (Signed)
Advanced Heart Failure Rounding Note   Subjective:    Gina Ashley is an 79 y.o. female with history of chronic atrial fibrillation on coumadin, diastolic heart failure and HTN. She also has DM type 2, COPD, OSA on CPAP and renal failure with baseline Cr 1.5-1.7. She has had 4 hospitalizations in the last 6 months, 1 included tibia/fibula fracture and the rest have been due to massive fluid overload in the setting of diastolic heart failure. She has required short term dialysis in the past for renal failure.   Echo with RV dysfunction and low urine output therefore started on milrinone 0.25 mcg/kg/min on 3/5. Had poor urine output so low dose dopamine added 3/7.   Overall 43 pound diuresis since admit.  Milrinone off yesterday but continued on dopamine 2.5.  Cr improving 2.58 today (baseline 1.8-2).    Slept well.  Currently no c/o orthopnea/PND or chest pain.      Objective:    Vital Signs:   Temp:  [97.6 F (36.4 C)-97.9 F (36.6 C)] 97.6 F (36.4 C) (03/18 1015) Pulse Rate:  [87-98] 94 (03/18 1100) Resp:  [12-26] 13 (03/18 1128) BP: (92-142)/(52-83) 95/52 mmHg (03/18 1128) SpO2:  [93 %-100 %] 99 % (03/18 1128) Weight:  [219 lb 6.4 oz (99.519 kg)] 219 lb 6.4 oz (99.519 kg) (03/18 0712) Last BM Date: 01/17/13  Weight change: Filed Weights   01/17/13 0359 01/18/13 0453 01/19/13 0712  Weight: 220 lb (99.791 kg) 220 lb 0.3 oz (99.8 kg) 219 lb 6.4 oz (99.519 kg)    Intake/Output:   Intake/Output Summary (Last 24 hours) at 01/19/13 1204 Last data filed at 01/19/13 0713  Gross per 24 hour  Intake    726 ml  Output   4100 ml  Net  -3374 ml     Physical Exam: General: Chronically ill appearing in chair. No resp difficulty on n/c  HEENT: normal  Neck: supple. JVP 7-8. Carotids 2+ bilat; no bruits. No lymphadenopathy or thryomegaly appreciated.  Cor: Distant. Irregular. No rubs, gallops or murmurs.  Lungs: decreased throughout  Abdomen: obese, soft, nontender, distention much  improved. Good bowel sounds.  Extremities: no cyanosis, clubbing, rash, tr edema. Well healing scar left proximal tibia RUE PICC Neuro: alert & orientedx3, cranial nerves grossly intact. moves all 4 extremities w/o difficulty. Affect pleasant   Telemetry: atrial flutter 90-110  Labs: Basic Metabolic Panel:  Recent Labs Lab 01/15/13 0540 01/16/13 0500 01/17/13 0505 01/18/13 0500 01/19/13 0500  NA 137 137 134* 136 138  K 3.7 4.0 3.4* 3.5 3.4*  CL 85* 86* 85* 87* 89*  CO2 45* 44* 41* 42* 41*  GLUCOSE 116* 123* 132* 116* 81  BUN 59* 65* 70* 75* 77*  CREATININE 2.52* 2.79* 2.87* 2.62* 2.58*  CALCIUM 9.8 9.6 9.6 10.0 10.5    Liver Function Tests: No results found for this basename: AST, ALT, ALKPHOS, BILITOT, PROT, ALBUMIN,  in the last 168 hours No results found for this basename: LIPASE, AMYLASE,  in the last 168 hours No results found for this basename: AMMONIA,  in the last 168 hours  CBC:  Recent Labs Lab 01/13/13 0500 01/16/13 0500 01/17/13 0505 01/18/13 0500 01/19/13 0500  WBC 5.1 4.8 4.8 5.8 5.3  HGB 9.3* 9.4* 9.1* 9.6* 9.9*  HCT 29.5* 31.5* 29.9* 30.2* 31.5*  MCV 86.0 89.2 88.7 85.8 87.0  PLT 112* 130* 128* 139* 159    Cardiac Enzymes: No results found for this basename: CKTOTAL, CKMB, CKMBINDEX, TROPONINI,  in the last  168 hours  BNP: BNP (last 3 results)  Recent Labs  01/03/13 0800  PROBNP 4706.0*      Imaging: No results found.   Medications:     Scheduled Medications: . [facility] HOLD] allopurinol  100 mg Oral Daily  . [facility] HOLD] amiodarone  400 mg Oral BID  . [[facility] HOLD] busPIRone  5 mg Oral Q12H  . [MAR HOLD] carbamide peroxide  5 drop Both Ears BID  . [facility] HOLD] carvedilol  12.5 mg Oral BID WC  . [[facility] HOLD] docusate sodium  100 mg Oral Q8H  . [MAR HOLD] feeding supplement (NEPRO CARB STEADY)  237 mL Oral BID BM  . [facility] HOLD] guaiFENesin  600 mg Oral BID  . [MAR HOLD] insulin aspart  0-5 Units Subcutaneous QHS  . [MAR HOLD] insulin  aspart  0-9 Units Subcutaneous TID WC  . [[facility] HOLD] insulin glargine  25 Units Subcutaneous QHS  . [[facility] HOLD] isosorbide-hydrALAZINE  1 tablet Oral BID  . [MAR HOLD] metroNIDAZOLE  500 mg Oral Q8H  . [MAR HOLD] miconazole  1 Applicatorful Vaginal QHS  . [[facility] HOLD] OxyCODONE  10 mg Oral Q12H  . [[facility] HOLD] pantoprazole  40 mg Oral Daily  . [MAR HOLD] polyethylene glycol  17 g Oral BID  . [facility] HOLD] potassium chloride  40 mEq Oral BID  . [MAR HOLD] sodium chloride  3 mL Intravenous Q12H  . [facility] HOLD] traZODone  50 mg Oral QHS  . [facility] HOLD] warfarin  3 mg Oral q1800  . [facility] HOLD] Warfarin - Pharmacist Dosing Inpatient   Does not apply q1800    Infusions: . sodium chloride 250 mL (01/18/13 1521)    PRN Medications: [[facility] HOLD] acetaminophen, [MAR HOLD] albuterol, [[facility] HOLD] alum & mag hydroxide-simeth, [[facility] HOLD] diphenhydrAMINE, [MAR HOLD] lactulose, [MAR HOLD] nitroGLYCERIN, [MAR HOLD] ondansetron (ZOFRAN) IV, [MAR HOLD] oxyCODONE, [MAR HOLD] simethicone, [MAR HOLD] sodium chloride, [MAR HOLD] sodium chloride   Assessment:   1. Acute on chronic right sided heart failure  2. Acute on chronic respiratory failure  3. Acute on chronic renal failure.  4. HTN  5. Obesity, suspect OHS/OSA  6. Chronic atrial flutter  - on coumadin  7. Deconditioning 8. Metabolic alkalosis 9. Hypokalemia 10. Hypotension, resolved  Plan/Discussion:    Diuresed 3.4 liters yesterday off of milrinone.  Cr stable on dopamine.   - Will stopped dopamine in preparation for DCCV - Start torsemide 60 mg po daily today. - Replete K  Atrial flutter is persistent on amio for rate control.  She has been in flutter at least since 10/13 but not sure how long before. Agree with Dr. Graciela Husbands that given her CHF, would be reasonable to try to keep her in NSR.  Amiodarone increased to 400 mg BID.   - Dopamine stopped this morning.   - TEE done but thick smoke in [place] and ? Small thrombus.  Think the safest plan at this point  will be 4 weeks therapeutic INR, continue loading amiodarone, and re-attempt cardioversion in 4 wks.   Disposition: back to SNF, possibly tomorrow. .   Length of Stay: [address] Marca Ancona 01/19/2013 12:11 PM

## 2013-01-19 NOTE — Progress Notes (Signed)
*  PRELIMINARY RESULTS* Echocardiogram Echocardiogram Transesophageal has been performed.  Gina Ashley 01/19/2013, 12:05 PM

## 2013-01-19 NOTE — CV Procedure (Signed)
See full TEE report in [facility]; moderate to severe biatrial enlargement; severe spontaneous contrast LAA with reduced emptying velocity; question small thrombus at tip; will review with colleagues and decide about proceeding with DCCV in AM or wait 3 weeks of therapeutic coumadin. Gina Ashley

## 2013-01-19 NOTE — Progress Notes (Signed)
CRITICAL VALUE ALERT  Critical value received:  CBG is 52  Date of notification:  01/19/13  Time of notification:  1244  Critical value read back: yes  Nurse who received alert:  Junie Panning   MD notified (1st page):  oti  Time of first page:  1350  MD notified (2nd page):  Time of second page:  Responding MD:  Oti  Time MD responded:  1351   Patient returned from her procedure. Has been NPO since midnight. CBG rechecked after 15 mins was 59. PO fluids and peanut butter was given and CBG was re-checked and is now 109. MD is aware no new orders given. No complaints from patient. No signs or symptoms of distress or discomfort. Will continue to monitor patient for further changes in condition.

## 2013-01-19 NOTE — H&P (View-Only) (Signed)
Advanced Heart Failure Rounding Note   Subjective:    Gina Ashley is an 79 y.o. female with history of chronic atrial fibrillation on coumadin, diastolic heart failure and HTN. She also has DM type 2, COPD, OSA on CPAP and renal failure with baseline Cr 1.5-1.7. She has had 4 hospitalizations in the last 6 months, 1 included tibia/fibula fracture and the rest have been due to massive fluid overload in the setting of diastolic heart failure. She has required short term dialysis in the past for renal failure.   Echo with RV dysfunction and low urine output therefore started on milrinone 0.25 mcg/kg/min on 3/5. Had poor urine output so low dose dopamine added 3/7.    Overall 42 pound diuresis since admit.  Restarted on milrinone and dopamine over the weekend due rise in Cr 2.87.   Cr improving 2.62 today (baseline 1.8-2).    Denies SOB/PND/Orthopnea. C/o ringing in ears and pain in rt ear.     Objective:    Vital Signs:   Temp:  [97.6 F (36.4 C)-97.9 F (36.6 C)] 97.6 F (36.4 C) (03/17 0453) Pulse Rate:  [91-108] 95 (03/17 0453) Resp:  [18] 18 (03/17 0453) BP: (102-138)/(58-82) 135/58 mmHg (03/17 0453) SpO2:  [93 %-95 %] 95 % (03/17 0453) Weight:  [220 lb 0.3 oz (99.8 kg)] 220 lb 0.3 oz (99.8 kg) (03/17 0453) Last BM Date: 01/17/13  Weight change: Filed Weights   01/16/13 0640 01/17/13 0359 01/18/13 0453  Weight: 217 lb 6 oz (98.6 kg) 220 lb (99.791 kg) 220 lb 0.3 oz (99.8 kg)    Intake/Output:   Intake/Output Summary (Last 24 hours) at 01/18/13 0839 Last data filed at 01/18/13 1610  Gross per 24 hour  Intake    750 ml  Output   4000 ml  Net  -3250 ml     Physical Exam: General: Chronically ill appearing in chair. No resp difficulty on n/c  HEENT: normal  Neck: supple. JVP 7-8. Carotids 2+ bilat; no bruits. No lymphadenopathy or thryomegaly appreciated.  Cor: Distant. Irregular. No rubs, gallops or murmurs.  Lungs: decreased throughout  Abdomen: obese, soft,  nontender, distention much improved. Good bowel sounds.  Extremities: no cyanosis, clubbing, rash, tr- 1+edema. Well healing scar left proximal tibia RUE PICC Neuro: alert & orientedx3, cranial nerves grossly intact. moves all 4 extremities w/o difficulty. Affect pleasant   Telemetry: atrial flutter 90-110  Labs: Basic Metabolic Panel:  Recent Labs Lab 01/14/13 0500 01/15/13 0540 01/16/13 0500 01/17/13 0505 01/18/13 0500  NA 138 137 137 134* 136  K 3.0* 3.7 4.0 3.4* 3.5  CL 84* 85* 86* 85* 87*  CO2 44* 45* 44* 41* 42*  GLUCOSE 98 116* 123* 132* 116*  BUN 56* 59* 65* 70* 75*  CREATININE 2.20* 2.52* 2.79* 2.87* 2.62*  CALCIUM 10.3 9.8 9.6 9.6 10.0    Liver Function Tests: No results found for this basename: AST, ALT, ALKPHOS, BILITOT, PROT, ALBUMIN,  in the last 168 hours No results found for this basename: LIPASE, AMYLASE,  in the last 168 hours No results found for this basename: AMMONIA,  in the last 168 hours  CBC:  Recent Labs Lab 01/13/13 0500 01/16/13 0500 01/17/13 0505 01/18/13 0500  WBC 5.1 4.8 4.8 5.8  HGB 9.3* 9.4* 9.1* 9.6*  HCT 29.5* 31.5* 29.9* 30.2*  MCV 86.0 89.2 88.7 85.8  PLT 112* 130* 128* 139*    Cardiac Enzymes: No results found for this basename: CKTOTAL, CKMB, CKMBINDEX, TROPONINI,  in the last  168 hours  BNP: BNP (last 3 results)  Recent Labs  01/03/13 0800  PROBNP 4706.0*      Imaging: No results found.   Medications:     Scheduled Medications: . allopurinol  100 mg Oral Daily  . amiodarone  200 mg Oral BID  . busPIRone  5 mg Oral Q12H  . carvedilol  12.5 mg Oral BID WC  . ciprofloxacin  250 mg Oral BID  . docusate sodium  100 mg Oral Q8H  . feeding supplement (NEPRO CARB STEADY)  237 mL Oral BID BM  . guaiFENesin  600 mg Oral BID  . insulin aspart  0-5 Units Subcutaneous QHS  . insulin aspart  0-9 Units Subcutaneous TID WC  . insulin glargine  25 Units Subcutaneous QHS  . isosorbide-hydrALAZINE  1 tablet Oral BID   . metroNIDAZOLE  500 mg Oral Q8H  . miconazole  1 Applicatorful Vaginal QHS  . OxyCODONE  10 mg Oral Q12H  . pantoprazole  40 mg Oral Daily  . polyethylene glycol  17 g Oral BID  . potassium chloride  40 mEq Oral BID  . torsemide  60 mg Oral Daily  . traZODone  50 mg Oral QHS  . warfarin  3 mg Oral q1800  . Warfarin - Pharmacist Dosing Inpatient   Does not apply q1800    Infusions: . DOPamine 2.5 mcg/kg/min (01/17/13 1005)    PRN Medications: acetaminophen, albuterol, alum & mag hydroxide-simeth, diphenhydrAMINE, lactulose, nitroGLYCERIN, ondansetron (ZOFRAN) IV, oxyCODONE, simethicone, sodium chloride   Assessment:   1. Acute on chronic right sided heart failure  2. Acute on chronic respiratory failure  3. Acute on chronic renal failure.  4. HTN  5. Obesity, suspect OHS/OSA  6. Chronic atrial flutter  - on coumadin  7. Deconditioning 8. Metabolic alkalosis 9. Hypokalemia 10. Hypotension, resolved  Plan/Discussion:    Weight is fairly stable.  Diuresed well.  Back on milrinone and dopamine and renal function improving.  She is still volume overloaded on exam.  Will cut off milrinone today in preparation for possible DCCV tomorrow.  Continue dopamine and treat with Lasix 80 mg IV every 8 hrs today.  Follow creatinine closely with cardiorenal syndrome.   AF is chronic on amio for rate control.  She has been in atrial fibrillation at least since 10/13 but not sure how long before. Agree with Dr. Graciela Husbands that given her CHF, would be reasonable to try to keep her in NSR.    - Increase amiodarone to 400 mg bid today - Will plan on stopping dopamine tomorrow am with TEE-guided DCCV attempt after that.   PT/OT following.   Marca Ancona 01/18/2013 8:43 AM Length of Stay: 16

## 2013-01-19 NOTE — Progress Notes (Signed)
Patient's BP is 92/57. Due for Coreg. No signs or symptoms of distress. No complaints. Notified PA. Ok to hold scheduled dose. Will continue to monitor patient for further changes in condition.

## 2013-01-19 NOTE — Progress Notes (Signed)
ANTICOAGULATION CONSULT NOTE - Follow Up Consult  Pharmacy Consult for Warfarin Indication: atrial fibrillation  Allergies  Allergen Reactions  . Morphine Sulfate Other (See Comments)    REACTION: change in personality  . Remeron (Mirtazapine) Other (See Comments)    Altered mental status, lethargy  . Tuna (Fish Allergy) Other (See Comments)    unknown  . Penicillins Rash    Tolerates Ancef.    Patient Measurements: Height: 5\' 3"  (160 cm) Weight: 219 lb 6.4 oz (99.519 kg) IBW/kg (Calculated) : 52.4  Vital Signs: Temp: 97.9 F (36.6 C) (03/18 0712) Temp src: Oral (03/18 0712) BP: 123/74 mmHg (03/18 0712) Pulse Rate: 88 (03/18 0712)  Labs:  Recent Labs  01/17/13 0505 01/18/13 0500 01/19/13 0500  HGB 9.1* 9.6* 9.9*  HCT 29.9* 30.2* 31.5*  PLT 128* 139* 159  LABPROT 28.5* 25.9* 23.9*  INR 2.86* 2.51* 2.25*  CREATININE 2.87* 2.62* 2.58*    Estimated Creatinine Clearance: 18.6 ml/min (by C-G formula based on Cr of 2.58).   Medications:  Prescriptions prior to admission  Medication Sig Dispense Refill  . busPIRone (BUSPAR) 5 MG tablet Take 5 mg by mouth every 12 (twelve) hours.      . diphenhydrAMINE (BENADRYL) 25 mg capsule Take 25 mg by mouth every 8 (eight) hours as needed for itching.      Marland Kitchen HYDROcodone-acetaminophen (NORCO/VICODIN) 5-325 MG per tablet Take 1 tablet by mouth every 4 (four) hours as needed for pain.      . Menthol-Zinc Oxide 0.4-20.5 % PSTE Apply 1 application topically daily.      . Multiple Vitamin (MULTIVITAMIN WITH MINERALS) TABS Take 1 tablet by mouth daily.      . ondansetron (ZOFRAN) 4 MG tablet Take 4 mg by mouth every 6 (six) hours as needed for nausea.      Marland Kitchen oxyCODONE (OXY IR/ROXICODONE) 5 MG immediate release tablet Take 5 mg by mouth every 4 (four) hours as needed for pain.      . simethicone (MYLICON) 80 MG chewable tablet Chew 80 mg by mouth every 6 (six) hours as needed for flatulence.      Marland Kitchen spironolactone (ALDACTONE) 25 MG  tablet Take 25 mg by mouth daily.      Marland Kitchen torsemide (DEMADEX) 20 MG tablet Take 20 mg by mouth daily.      . vitamin C (ASCORBIC ACID) 500 MG tablet Take 500 mg by mouth every 12 (twelve) hours.      Marland Kitchen warfarin (COUMADIN) 3 MG tablet Take 3 mg by mouth daily.      Marland Kitchen allopurinol (ZYLOPRIM) 100 MG tablet Take 100 mg by mouth daily.      Marland Kitchen amiodarone (PACERONE) 200 MG tablet Take 200 mg by mouth daily.       . carvedilol (COREG) 25 MG tablet Take 25 mg by mouth 2 (two) times daily with a meal.      . docusate sodium (COLACE) 100 MG capsule Take 100 mg by mouth every 8 (eight) hours.       Marland Kitchen guaiFENesin (MUCINEX) 600 MG 12 hr tablet Take 600 mg by mouth 2 (two) times daily.       . hydrALAZINE (APRESOLINE) 25 MG tablet Take 25 mg by mouth 3 (three) times daily.      . insulin aspart (NOVOLOG) 100 UNIT/ML injection Inject 1-5 Units into the skin 3 (three) times daily with meals. 150-200=1unit,201-250=2units,251-300=3units,301-350=4units,351-400=5units if over 400 call MD      . insulin glargine (LANTUS) 100 UNIT/ML injection  Inject 5 Units into the skin at bedtime.      . isosorbide dinitrate (ISORDIL) 20 MG tablet Take 20 mg by mouth every 8 (eight) hours.       . nitroGLYCERIN (NITROSTAT) 0.4 MG SL tablet Place 0.4 mg under the tongue every 5 (five) minutes as needed. Chest pain      . oxyCODONE (OXYCONTIN) 10 MG 12 hr tablet Take 10 mg by mouth every 12 (twelve) hours.      . pantoprazole (PROTONIX) 40 MG tablet Take 40 mg by mouth daily.      . polyethylene glycol (MIRALAX / GLYCOLAX) packet Take 17 g by mouth 2 (two) times daily.       . traZODone (DESYREL) 50 MG tablet Take 50 mg by mouth at bedtime.      . Vitamin D, Ergocalciferol, (DRISDOL) 50000 UNITS CAPS Take 50,000 Units by mouth every 7 (seven) days.      . vitamin k 100 MCG tablet Take 100 mcg by mouth daily.       79yo female admitted 01/02/2013 from [facility] with staff stating need for emergent HD though may only require  diuresis, to continue Coumadin for Afib; admitted with subtherapeutic INR  PMH: HTN, Afib, Diastolic HF, T2DM, COPD/OSA, Stage III CKD (last 8 months: 1.1-2.7)  Events: Plan for DCCV today and possible discharge 3/19, stopped dopamine, start toresmide,   AC: Afib (goal 2-3).  PTA (3mg  daily at [facility]).  Home dose of 3mg  qday. D/t starting Flagyl, will go back to daily INR checks. *Amio increased to 400mg  BID 3/17  ID: GNR UTI: chronic foley at [facility], >100K KPC, order to change foley, afebrile, WBC WNL. ID following, will not treat now unless change in clinical status 3/15 - cdiff positive 3/15 Metronidazole >> 3/15 cipro>> (x3 days, stop date in place)  CARDS: AoC HF: EF 50-55%, LA/RA severe dilation, Afib, HTN: . For  DCCV today.  amio 200mg  BID  coreg, bidil, K 40 bid (3.4),  726/3425 (-2699), HR 80s, 120s/70s,   Endo: T2DM (A1c 6.8)/Gout: on Lantus/SSI, CBGs <150 , sens SSI + lantus (incr 3/8). On allopurinol  GI/Nutr: GERD. On simethicone prn, colace, PEG, po ppi, nepro, lactulose prn  Neuro: depression/pain. On buspar, Oxy IR/ER, trazodone  Nephro: CKD3.  , no HD planned (not good cand.), renal signed off. BID KCl ordered.  Pulm: OSA, COPD. 95% on 3LNC. Albuterol PRN. Hem/Onc: Anemia of chronic disease (Hgb 9.1, slight down), plts 128 (essentially stable), no anemia panel  PTA Medication Issues: torsemide, MVT, Vit C/D Best Practices: warfarin, po ppi  Goal of Therapy:  INR 2-3 Monitor platelets by anticoagulation protocol: Yes   Plan:  1. Warfarin 3 mg daily 2. Daily INR, while on flagyl and cipro, and recent amiodarone change   Thank you for allowing pharmacy to be a part of this patients care team.  Lovenia Kim Pharm.D., BCPS Clinical Pharmacist 01/19/2013 7:49 AM Pager: [phone] Phone: [phone]

## 2013-01-19 NOTE — Interval H&P Note (Signed)
History and Physical Interval Note:  01/19/2013 10:59 AM  Gina Ashley  has presented today for surgery, with the diagnosis of afib  The various methods of treatment have been discussed with the patient and family. After consideration of risks, benefits and other options for treatment, the patient has consented to  Procedure(s): TRANSESOPHAGEAL ECHOCARDIOGRAM (TEE) (N/A) as a surgical intervention .  The patient's history has been reviewed, patient examined, no change in status, stable for surgery.  I have reviewed the patient's chart and labs.  Questions were answered to the patient's satisfaction.     Olga Millers

## 2013-01-19 NOTE — Progress Notes (Signed)
[facility] PROGRESS NOTE  Gina Ashley AOZ:[id number] DOB: 05-20-1927 DOA: 01/02/2013 PCP: Ernestine Conrad, MD  Assessment/Plan: Principal Problem:   Shortness of breath Active Problems:   DM   OBSTRUCTIVE SLEEP APNEA   HYPERTENSION   GERD   Persistent Atrial flutter   COPD (chronic obstructive pulmonary disease)   Anemia due to chronic illness   CKD (chronic kidney disease)   Chronic diastolic heart failure   Obesity (BMI 30-39.9)   Depression   Cardiorenal syndrome with renal failure   Hypokalemia   Acute on chronic diastolic heart failure   Enteritis due to Clostridium difficile    1. Acute right heart congestive heart failure: Patient presented with worsening edema, increasing abdominal girth and SOB, despite iv Lasix. Clinically, she was found to be anasarcic and felt to have acute on chronic right sided HF and marked volume overload on exam. Dr Marca Ancona provided cardiology consultation, reviewed 2D echocardiogram, which showed poorly visualized moderately dilated RV with moderately decreased systolic function and mildly left-shifted IV septum. Patient was initially managed with ivi Milrinone, as well as aggressive diuresis. Dr Reuel Boom Bensimhon/Heart Failure team followed subsequently, and were extremely helpful in rationalizing heart failure treatment. Clinically, patient has responded satisfactorily to treatment, and as of 01/15/13, was feeling considerably better. Diuretics were discontinued on 01/14/13, due to hypotension, and patient administered a bolus of NS. BP is satisfactory at this time. Diuretics were placed on hold till 01/17/13, but as creatinine has failed to stabilize, and weight is going up, Dr Graciela Husbands on 01/17/13 restarted Milrinone/Dopamine. Dr Delano Metz was re-consulted on 01/17/13, for possible HD, but has opined that patient is not a suitable candidate, due to her severe co-morbidities. Dr Marca Ancona had planned TEE/DCCV on 01/19/13, but TEE revealed thick  smoke in LA and ? Small thrombus. He has opined that the safest plan at this point, will be 4 weeks therapeutic INR, continue loading Amiodarone, and re-attempt cardioversion in 4 wks. Managing as recommended. Dopamine was discontinued again on 01/19/13. 2. Acute on chronic kidney disease stage 3/cardio-renal syndrome: Patient's baseline creatinine was 1.14 on 09/15/12. Creatinine was 1.65 at presentation, and gradually climbed, reaching a high of 2.20 on 01/14/13. GFR now in CKD stage 4 range. In the initial few days of hospitalization, she was managed with ivi low dose Dopamine. This was discontinued on 01/14/13. Following renal indices. Nephrology consultation was provided by Dr Delano Metz, and he was again re-consulted on 01/17/13. Patient is considered a poor candidate for RRT, due to her co-morbidities. The discussion in #1 above is pertinent. Creatinine has plateaued as of 01/18/13.  3. PAF (paroxysmal atrial fibrillation), was in SR 06/2012: Patient has a known history of chronic atrial fibrillation/flutter and has failed DCCV in the past. Managed with Coreg and Amiodarone, with satisfactory rate control. On Coumadin per pharmacy, and INR remains therapeutic.   4. Hypokalemia: Likely due to diuretics. Repleted as indicated. Following Lytes.  5. DM with complications (CKD), controlled: Hemoglobin A1c on this admission 6.8. Managing with diet, SSI and Lantus, with reasonable control.   6. HTN: Patient has a known history of HTN, but during this hospitalization, BP has been borderline to low. On Bidil and Coreg (dose lowered this admit, secondary to hypotension), with holding parameters orders. Patient appears to be tolerating borderline BP well.   7. Depression: Stable on Buspirone.  8. Anemia: Patient has a mild normocytic anemia, due to chronic illness. HB has remained stable/reasonable.  9. OSA: Known history of OSA/OHS: Managing  with nocturnal CPAP, and appear stable from this view point as of  01/15/13, although as discussed in #1, she presented with right heart failure. 10. Physical deconditioning: Patient is significantly deconditioned, due to multiple co-morbidites, multiple hospitalizations over past several months and recent acute illness. PT/OT following. Plan discharge to [facility], when stable.   11. C. Difficile infection: Patient had a brief episode of diarrhea on 01/05/13. C.Difficile PCR is positive. She has been commenced on a 10-day course of Flagyl, effective 01/16/13 now day #4.  12. UTI: U/A of 01/16/13, demonstrated significant pyuria and bacteriuria. Culture is positive for GNR. On 3-day course of Ciprofloxacin (avoiding long course, due to C. Difficile infection), concluded on 01/18/13. 13: Right ear discomfort: Patient complained of discomfort in her right ear on 01/18/13. Otoscopic examination revealed small amount of impacted cerumen. Debrox ordered.     Code Status: Limited code blue. No CPR or intubation but OK with anti-arrhythmic meds, defib/cardiovert and vasoactive drugs.  Family Communication:  Disposition Plan: To be determined. For SNF, when OK-ed by cardiology.    Brief narrative: 79 y.o. female with history of GERD, chronic back pain, Hiatal hernia, HTN, CKD stage III; Atrial fibrillation, OSA on CPAP, Depression, Chronic diastolic heart failure (08/31/2012), Tibia/Fibula fracture s/p surgical repair with post-op course complicated by ARF, requiring temporary HD, COPD, Obesity Hypoventilation syndrome, chronic respiratory failure. Following her last hospitalization, she was discharged to [facility] and has been living there since then. Family states that 1 month ago she had her perma-cath was discontinued. For the past 6 days, she has had worsening edema with increasing abdominal girth and SOB, despite iv Lasix. She was sent to [facility] ED for further treatment, and was admitted.   Consultants:  Dr Arvilla Meres, cardiologist.  Dr dalton Shirlee Latch,  cardiologist.   Dr Johny Sax, ID.  Dr Delano Metz, nephrologist.   Procedures:  CXRs  2D Echocardiogram     Antibiotics:  N/A.   HPI/Subjective: Feels better today.   Objective: Vital signs in last 24 hours: Temp:  [97.6 F (36.4 C)-98 F (36.7 C)] 98 F (36.7 C) (03/18 1300) Pulse Rate:  [87-98] 88 (03/18 1246) Resp:  [12-26] 20 (03/18 1246) BP: (92-142)/(52-83) 114/73 mmHg (03/18 1246) SpO2:  [93 %-100 %] 100 % (03/18 1246) Weight:  [99.519 kg (219 lb 6.4 oz)] 99.519 kg (219 lb 6.4 oz) (03/18 0712) Weight change:  Last BM Date: 01/17/13  Intake/Output from previous day: 03/17 0701 - 03/18 0700 In: 726 [P.O.:720; I.V.:6] Out: 3425 [Urine:3425] Total I/O In: 360 [P.O.:360] Out: 1175 [Urine:1175]   Physical Exam: General: Comfortable, alert, communicative, fully oriented, not short of breath at rest.  HEENT:  Mild clinical pallor, no jaundice, no conjunctival injection or discharge.  NECK:  Supple, JVP not seen, no carotid bruits, no palpable lymphadenopathy, no palpable goiter. CHEST:  Fine crackles in bases. HEART:  Sounds 1 and 2 heard, normal, regular, no murmurs. ABDOMEN:  Obese, soft, non-tender, no palpable organomegaly, no palpable masses, normal bowel sounds. GENITALIA:  Not examined. LOWER EXTREMITIES:  Moderate pitting edema, bilateral LLE.  MUSCULOSKELETAL SYSTEM:  Generalized osteoarthritic changes, otherwise, normal. CENTRAL NERVOUS SYSTEM:  No focal neurologic deficit on gross examination.  Lab Results:  Recent Labs  01/18/13 0500 01/19/13 0500  WBC 5.8 5.3  HGB 9.6* 9.9*  HCT 30.2* 31.5*  PLT 139* 159    Recent Labs  01/18/13 0500 01/19/13 0500  NA 136 138  K 3.5 3.4*  CL 87* 89*  CO2  42* 41*  GLUCOSE 116* 81  BUN 75* 77*  CREATININE 2.62* 2.58*  CALCIUM 10.0 10.5   Recent Results (from the past 240 hour(s))  URINE CULTURE     Status: None   Collection Time    01/16/13  2:30 AM      Result Value Range Status    Specimen Description URINE, CATHETERIZED   Final   Special Requests CX ADDED AT 0324 ON 161096   Final   Culture  Setup Time 01/16/2013 11:18   Final   Colony Count >=100,000 COLONIES/ML   Final   Culture GRAM NEGATIVE RODS   Final   Report Status PENDING   Incomplete  CLOSTRIDIUM DIFFICILE BY PCR     Status: Abnormal   Collection Time    01/16/13  3:35 AM      Result Value Range Status   C difficile by pcr POSITIVE (*) NEGATIVE Final   Comment: CRITICAL RESULT CALLED TO, READ BACK BY AND VERIFIED WITH:     HANEY R.,RN 01/16/13 0842 BY JONESJ     Studies/Results: No results found.  Medications: Scheduled Meds: . allopurinol  100 mg Oral Daily  . amiodarone  400 mg Oral BID  . busPIRone  5 mg Oral Q12H  . carbamide peroxide  5 drop Both Ears BID  . carvedilol  12.5 mg Oral BID WC  . docusate sodium  100 mg Oral Q8H  . feeding supplement (NEPRO CARB STEADY)  237 mL Oral BID BM  . guaiFENesin  600 mg Oral BID  . insulin aspart  0-5 Units Subcutaneous QHS  . insulin aspart  0-9 Units Subcutaneous TID WC  . insulin glargine  25 Units Subcutaneous QHS  . isosorbide-hydrALAZINE  1 tablet Oral BID  . metroNIDAZOLE  500 mg Oral Q8H  . miconazole  1 Applicatorful Vaginal QHS  . OxyCODONE  10 mg Oral Q12H  . pantoprazole  40 mg Oral Daily  . polyethylene glycol  17 g Oral BID  . potassium chloride  40 mEq Oral BID  . sodium chloride  3 mL Intravenous Q12H  . traZODone  50 mg Oral QHS  . warfarin  3 mg Oral q1800  . Warfarin - Pharmacist Dosing Inpatient   Does not apply q1800   Continuous Infusions: . sodium chloride 250 mL (01/18/13 1521)   PRN Meds:.acetaminophen, albuterol, alum & mag hydroxide-simeth, diphenhydrAMINE, lactulose, nitroGLYCERIN, ondansetron (ZOFRAN) IV, oxyCODONE, simethicone, sodium chloride, sodium chloride    LOS: 17 days   Zaki Gertsch,CHRISTOPHER  [facility] Pager [phone]. If 8PM-8AM, please contact night-coverage at [email or web address], password  [facility] 01/19/2013, 3:08 PM  LOS: 17 days

## 2013-01-19 NOTE — Progress Notes (Signed)
Pt. Has hx of OSA but refused CPAP. Pt. States she only wears Oxygen QHS.

## 2013-01-20 ENCOUNTER — Encounter (HOSPITAL_COMMUNITY): Payer: Self-pay | Admitting: Cardiology

## 2013-01-20 ENCOUNTER — Inpatient Hospital Stay (HOSPITAL_COMMUNITY): Payer: Medicare Other

## 2013-01-20 LAB — GLUCOSE, CAPILLARY
Glucose-Capillary: 106 mg/dL — ABNORMAL HIGH (ref 70–99)
Glucose-Capillary: 192 mg/dL — ABNORMAL HIGH (ref 70–99)

## 2013-01-20 LAB — BASIC METABOLIC PANEL
BUN: 79 mg/dL — ABNORMAL HIGH (ref 6–23)
CO2: 38 mEq/L — ABNORMAL HIGH (ref 19–32)
Chloride: 92 mEq/L — ABNORMAL LOW (ref 96–112)
Creatinine, Ser: 2.74 mg/dL — ABNORMAL HIGH (ref 0.50–1.10)
Glucose, Bld: 124 mg/dL — ABNORMAL HIGH (ref 70–99)

## 2013-01-20 LAB — CBC
Hemoglobin: 8.7 g/dL — ABNORMAL LOW (ref 12.0–15.0)
MCH: 27.3 pg (ref 26.0–34.0)
MCV: 87.5 fL (ref 78.0–100.0)
RBC: 3.19 MIL/uL — ABNORMAL LOW (ref 3.87–5.11)

## 2013-01-20 MED ORDER — TORSEMIDE 20 MG PO TABS
60.0000 mg | ORAL_TABLET | Freq: Every day | ORAL | Status: DC
  Administered 2013-01-21 – 2013-01-23 (×3): 60 mg via ORAL
  Filled 2013-01-20 (×3): qty 3

## 2013-01-20 MED ORDER — AMIODARONE HCL 200 MG PO TABS
400.0000 mg | ORAL_TABLET | Freq: Every day | ORAL | Status: DC
  Administered 2013-01-20 – 2013-01-23 (×4): 400 mg via ORAL
  Filled 2013-01-20 (×4): qty 2

## 2013-01-20 MED ORDER — INSULIN GLARGINE 100 UNIT/ML ~~LOC~~ SOLN
18.0000 [IU] | Freq: Every day | SUBCUTANEOUS | Status: DC
  Administered 2013-01-20 – 2013-01-22 (×3): 18 [IU] via SUBCUTANEOUS
  Filled 2013-01-20 (×4): qty 0.18

## 2013-01-20 NOTE — Progress Notes (Signed)
Inpatient Diabetes Program Recommendations  AACE/ADA: New Consensus Statement on Inpatient Glycemic Control (2013)  Target Ranges:  Prepandial:   less than 140 mg/dL      Peak postprandial:   less than 180 mg/dL (1-2 hours)      Critically ill patients:  140 - 180 mg/dL   Reason for Visit: Results for Gina Ashley, Gina Ashley (MRN [id number]) as of 01/20/2013 11:05  Ref. Range 01/19/2013 13:45 01/19/2013 15:52 01/19/2013 21:26 01/20/2013 06:03  Glucose-Capillary Latest Range: 70-99 mg/dL 629 (H) 528 (H) 413 (H) 106 (H)   Consider reducing Lantus to 18 units q HS.  Note patient had hypoglycemic event yesterday when NPO.  Will follow.

## 2013-01-20 NOTE — Progress Notes (Addendum)
ANTICOAGULATION CONSULT NOTE - Follow Up Consult  Pharmacy Consult for Warfarin Indication: atrial fibrillation   Recent Labs  01/18/13 0500 01/19/13 0500 01/20/13 0430  HGB 9.6* 9.9* 8.7*  HCT 30.2* 31.5* 27.9*  PLT 139* 159 130*  LABPROT 25.9* 23.9* 26.0*  INR 2.51* 2.25* 2.52*  CREATININE 2.62* 2.58* 2.74*   Estimated Creatinine Clearance: 17.1 ml/min (by C-G formula based on Cr of 2.74).  Assessment:  79yo female continuing on chronic coumadin for atrial fibrillation.  INR continues to be therapeutic at 2.52 today on home dose of 3mg  daily. Patient completed 6-dose course of ciprofloxacin yesterday, amiodarone dose changed to 400mg  daily today, and today is day 5 of metronidazole so will continue to monitor closely for any possible interaction. Hgb down to 8.7, but no overt bleeding noted in chart or by HF team.  Goal: INR 2-3  Plan:  1. Continue Warfarin 3 mg daily 2. Daily INR --- while on flagyl and recent amiodarone change 3. Continue to monitor signs/symptoms of bleeding.   Benjaman Pott, PharmD, BCPS 01/20/2013   10:47 AM

## 2013-01-20 NOTE — Progress Notes (Signed)
[facility] PROGRESS NOTE  Gina Ashley WUJ:[id number] DOB: 1927/04/25 DOA: 01/02/2013 PCP: Ernestine Conrad, MD  Assessment/Plan: Principal Problem:   Shortness of breath Active Problems:   DM   OBSTRUCTIVE SLEEP APNEA   HYPERTENSION   GERD   Persistent Atrial flutter   COPD (chronic obstructive pulmonary disease)   Anemia due to chronic illness   CKD (chronic kidney disease)   Chronic diastolic heart failure   Obesity (BMI 30-39.9)   Depression   Cardiorenal syndrome with renal failure   Hypokalemia   Acute on chronic diastolic heart failure   Enteritis due to Clostridium difficile    1. Acute right heart congestive heart failure: Patient presented with worsening edema, increasing abdominal girth and SOB, despite iv Lasix. Clinically, she was found to be anasarcic and felt to have acute on chronic right sided HF and marked volume overload on exam. Dr Marca Ancona provided cardiology consultation, reviewed 2D echocardiogram, which showed poorly visualized moderately dilated RV with moderately decreased systolic function and mildly left-shifted IV septum. Patient was initially managed with ivi Milrinone, as well as aggressive diuresis. Dr Reuel Boom Bensimhon/Heart Failure team followed subsequently, and were extremely helpful in rationalizing heart failure treatment. Clinically, patient has responded satisfactorily to treatment, and as of 01/15/13, was feeling considerably better. Diuretics were discontinued on 01/14/13, due to hypotension, and patient administered a bolus of NS. BP is satisfactory at this time. Diuretics were placed on hold till 01/17/13, but as creatinine has failed to stabilize, and weight is going up, Dr Graciela Husbands on 01/17/13 restarted Milrinone/Dopamine. Dr Delano Metz was re-consulted on 01/17/13, for possible HD, but has opined that patient is not a suitable candidate, due to her severe co-morbidities. Dr Marca Ancona had planned TEE/DCCV on 01/19/13, but TEE revealed thick  smoke in [place] and ? Small thrombus. He has opined that the safest plan at this point, will be 4 weeks therapeutic INR, continue loading Amiodarone, and re-attempt cardioversion in 4 wks. Managing as recommended. Dopamine was discontinued again on 01/19/13. 2. Acute on chronic kidney disease stage 3/cardio-renal syndrome: Patient's baseline creatinine was 1.14 on 09/15/12. Creatinine was 1.65 at presentation, and gradually climbed, reaching a high of 2.20 on 01/14/13. GFR now in CKD stage 4 range. In the initial few days of hospitalization, she was managed with ivi low dose Dopamine. This was discontinued on 01/14/13. Following renal indices. Nephrology consultation was provided by Dr Delano Metz, and he was again re-consulted on 01/17/13. Patient is considered a poor candidate for RRT, due to her co-morbidities. The discussion in #1 above is pertinent. Creatinine trending up.  Diuretics on hold for today. 3. PAF (paroxysmal atrial fibrillation), was in SR 06/2012: Patient has a known history of chronic atrial fibrillation/flutter and has failed DCCV in the past. Managed with Coreg and Amiodarone, with satisfactory rate control. On Coumadin per pharmacy, and INR remains therapeutic.   4. Hypokalemia: Likely due to diuretics. Repleted as indicated. Following Lytes.  5. DM with complications (CKD), controlled: Hemoglobin A1c on this admission 6.8. Managing with diet, SSI and Lantus, with reasonable control.   6. HTN: Patient has a known history of HTN, but during this hospitalization, BP has been borderline to low. On Bidil and Coreg (dose lowered this admit, secondary to hypotension), with holding parameters orders. Patient appears to be tolerating borderline BP well.   7. Depression: Stable on Buspirone.  8. Anemia: Patient has a mild normocytic anemia, due to chronic illness. HB has remained stable/reasonable.  9. OSA: Known history of  OSA/OHS: Managing with nocturnal CPAP, and appear stable from this view point  as of 01/15/13, although as discussed in #1, she presented with right heart failure. 10. Physical deconditioning: Patient is significantly deconditioned, due to multiple co-morbidites, multiple hospitalizations over past several months and recent acute illness. PT/OT following. Plan discharge to [facility], when stable.   11. C. Difficile infection: Patient had a brief episode of diarrhea on 01/05/13. C.Difficile PCR is positive. She has been commenced on a 10-day course of Flagyl, effective 01/16/13 now day #5.  12. UTI: U/A of 01/16/13, demonstrated significant pyuria and bacteriuria. Culture is positive for GNR. On 3-day course of Ciprofloxacin (avoiding long course, due to C. Difficile infection), concluded on 01/18/13. 13: Right ear discomfort: Patient complained of discomfort in her right ear on 01/18/13. Otoscopic examination revealed small amount of impacted cerumen. Debrox ordered.  14. Discussion.  With patient's cardiorenal syndrome and multiple co morbidities, it is proving challenging to adequately diurese her, without significantly impairing her renal function.  She is not a candidate for hemodialysis.  She has had multiple admissions over the last 6 months for volume related issues.  It appears that patient may not be able to maintain her volume status outside the hospital. Cardiology has recommended palliative care consult to further discuss goals of care, and I think this is reasonable. I discussed this with the patient and she is agreeable to discuss this.  I tried contacting daughter, but was unable to reach her over the phone.    Code Status: Limited code blue. No CPR or intubation but OK with anti-arrhythmic meds, defib/cardiovert and vasoactive drugs.  Family Communication:  Disposition Plan: To be determined. For SNF, when OK-ed by cardiology.    Brief narrative: 79 y.o. female with history of GERD, chronic back pain, Hiatal hernia, HTN, CKD stage III; Atrial fibrillation, OSA on  CPAP, Depression, Chronic diastolic heart failure (08/31/2012), Tibia/Fibula fracture s/p surgical repair with post-op course complicated by ARF, requiring temporary HD, COPD, Obesity Hypoventilation syndrome, chronic respiratory failure. Following her last hospitalization, she was discharged to [facility] and has been living there since then. Family states that 1 month ago she had her perma-cath was discontinued. For the past 6 days, she has had worsening edema with increasing abdominal girth and SOB, despite iv Lasix. She was sent to [facility] ED for further treatment, and was admitted.   Consultants:  Dr Arvilla Meres, cardiologist.  Dr dalton Shirlee Latch, cardiologist.   Dr Johny Sax, ID.  Dr Delano Metz, nephrologist.   Procedures:  CXRs  2D Echocardiogram     Antibiotics:  N/A.   HPI/Subjective: Shortness of breath is improving.  No new complaints.   Objective: Vital signs in last 24 hours: Temp:  [98 F (36.7 C)-98.2 F (36.8 C)] 98.2 F (36.8 C) (03/19 0516) Pulse Rate:  [66-88] 66 (03/19 0516) Resp:  [16-20] 16 (03/19 0516) BP: (105-122)/(64-79) 105/64 mmHg (03/19 0516) SpO2:  [97 %-100 %] 98 % (03/19 0516) Weight:  [95.301 kg (210 lb 1.6 oz)] 95.301 kg (210 lb 1.6 oz) (03/19 0326) Weight change:  Last BM Date: 01/19/13  Intake/Output from previous day: 03/18 0701 - 03/19 0700 In: 720 [P.O.:720] Out: 2052 [Urine:2050; Stool:2] Total I/O In: 360 [P.O.:360] Out: -    Physical Exam: General: Comfortable, alert, communicative, fully oriented, not short of breath at rest.  HEENT:  Mild clinical pallor, no jaundice, no conjunctival injection or discharge.  NECK:  Supple, JVP not seen, no carotid bruits, no palpable  lymphadenopathy, no palpable goiter. CHEST:  Fine crackles in bases. HEART:  Sounds 1 and 2 heard, normal, regular, no murmurs. ABDOMEN:  Obese, soft, non-tender, no palpable organomegaly, no palpable masses, normal bowel sounds. GENITALIA:   Not examined. LOWER EXTREMITIES:  Moderate pitting edema, bilateral LLE.  MUSCULOSKELETAL SYSTEM:  Generalized osteoarthritic changes, otherwise, normal. CENTRAL NERVOUS SYSTEM:  No focal neurologic deficit on gross examination.  Lab Results:  Recent Labs  01/19/13 0500 01/20/13 0430  WBC 5.3 4.4  HGB 9.9* 8.7*  HCT 31.5* 27.9*  PLT 159 130*    Recent Labs  01/19/13 0500 01/20/13 0430  NA 138 139  K 3.4* 4.3  CL 89* 92*  CO2 41* 38*  GLUCOSE 81 124*  BUN 77* 79*  CREATININE 2.58* 2.74*  CALCIUM 10.5 9.6   Recent Results (from the past 240 hour(s))  URINE CULTURE     Status: None   Collection Time    01/16/13  2:30 AM      Result Value Range Status   Specimen Description URINE, CATHETERIZED   Final   Special Requests CX ADDED AT 0324 ON [id number]   Final   Culture  Setup Time 01/16/2013 11:18   Final   Colony Count >=100,000 COLONIES/ML   Final   Culture     Final   Value: KLEBSIELLA PNEUMONIAE     Note: THIS ISOLATE PREVIOUSLY CONFIRMED AS [facility] CARBAPENEMASE PRODUCER CRITICAL RESULT CALLED TO, READ BACK BY AND VERIFIED WITH: TIMED TIJANI@1122  ON [id number] BY [facility] COLISTIN PENDING CALLED INFECTION CONTROL PEGGY DANIELLA [id number] BY [facility]   Report Status PENDING   Incomplete   Organism ID, Bacteria KLEBSIELLA PNEUMONIAE   Final  CLOSTRIDIUM DIFFICILE BY PCR     Status: Abnormal   Collection Time    01/16/13  3:35 AM      Result Value Range Status   C difficile by pcr POSITIVE (*) NEGATIVE Final   Comment: CRITICAL RESULT CALLED TO, READ BACK BY AND VERIFIED WITH:     HANEY R.,RN 01/16/13 0842 BY JONESJ     Studies/Results: No results found.  Medications: Scheduled Meds: . allopurinol  100 mg Oral Daily  . amiodarone  400 mg Oral Daily  . busPIRone  5 mg Oral Q12H  . carbamide peroxide  5 drop Both Ears BID  . carvedilol  12.5 mg Oral BID WC  . docusate sodium  100 mg Oral Q8H  . feeding supplement (NEPRO CARB STEADY)  237 mL Oral BID BM  . guaiFENesin  600  mg Oral BID  . insulin aspart  0-5 Units Subcutaneous QHS  . insulin aspart  0-9 Units Subcutaneous TID WC  . insulin glargine  25 Units Subcutaneous QHS  . isosorbide-hydrALAZINE  1 tablet Oral BID  . metroNIDAZOLE  500 mg Oral Q8H  . miconazole  1 Applicatorful Vaginal QHS  . OxyCODONE  10 mg Oral Q12H  . pantoprazole  40 mg Oral Daily  . polyethylene glycol  17 g Oral BID  . sodium chloride  3 mL Intravenous Q12H  . [START ON 01/21/2013] torsemide  60 mg Oral Daily  . traZODone  50 mg Oral QHS  . warfarin  3 mg Oral q1800  . Warfarin - Pharmacist Dosing Inpatient   Does not apply q1800   Continuous Infusions: . sodium chloride 250 mL (01/20/13 0641)   PRN Meds:.acetaminophen, albuterol, alum & mag hydroxide-simeth, diphenhydrAMINE, lactulose, nitroGLYCERIN, ondansetron (ZOFRAN) IV, oxyCODONE, simethicone, sodium chloride, sodium chloride    LOS: 18  days   [facility]  [facility] Pager [phone]. If 8PM-8AM, please contact night-coverage at [email or web address], password [facility] 01/20/2013, 12:17 PM  LOS: 18 days

## 2013-01-20 NOTE — Progress Notes (Signed)
Advanced Heart Failure Rounding Note   Subjective:    [name] is an 79 y.o. female with history of chronic atrial fibrillation on coumadin, diastolic heart failure and HTN. She also has DM type 2, COPD, OSA on CPAP and renal failure with baseline Cr 1.5-1.7. She has had 4 hospitalizations in the last 6 months, 1 included tibia/fibula fracture and the rest have been due to massive fluid overload in the setting of diastolic heart failure. She has required short term dialysis in the past for renal failure.   Echo with RV dysfunction and low urine output therefore started on milrinone 0.25 mcg/kg/min on 3/5. Had poor urine output so low dose dopamine added 3/7.   Overall 43 pound diuresis since admit.  Milrinone off 3/18 and dopamine off yesterday.  Cr improving 2.74 today (baseline 1.8-2).  Weight says she is down 9 pounds overnight but ? Accuracy.  Unable to complete cardioversion yesterday due to smoke.  Had abdominal pain yesterday due to no BM.  Felt better after BM.  No orthopnea/PND   Objective:    Vital Signs:   Temp:  [97.6 F (36.4 C)-98.2 F (36.8 C)] 98.2 F (36.8 C) (03/19 0516) Pulse Rate:  [66-94] 66 (03/19 0516) Resp:  [12-26] 16 (03/19 0516) BP: (92-122)/(52-79) 105/64 mmHg (03/19 0516) SpO2:  [93 %-100 %] 98 % (03/19 0516) Weight:  [210 lb 1.6 oz (95.301 kg)] 210 lb 1.6 oz (95.301 kg) (03/19 0326) Last BM Date: 01/17/13  Weight change: Filed Weights   01/18/13 0453 01/19/13 0712 01/20/13 0326  Weight: 220 lb 0.3 oz (99.8 kg) 219 lb 6.4 oz (99.519 kg) 210 lb 1.6 oz (95.301 kg)    Intake/Output:   Intake/Output Summary (Last 24 hours) at 01/20/13 0743 Last data filed at 01/20/13 0516  Gross per 24 hour  Intake    720 ml  Output   1377 ml  Net   -657 ml     Physical Exam: General: Chronically ill appearing in chair. No resp difficulty on n/c  HEENT: normal  Neck: supple. JVP 8-9. Carotids 2+ bilat; no bruits. No lymphadenopathy or thryomegaly  appreciated.  Cor: Distant. Irregular. No rubs, gallops or murmurs.  Lungs: decreased throughout  Abdomen: obese, soft, nontender, distention much improved. Good bowel sounds.  Extremities: no cyanosis, clubbing, rash, tr edema. Well healing scar left proximal tibia RUE PICC Neuro: alert & orientedx3, cranial nerves grossly intact. moves all 4 extremities w/o difficulty. Affect pleasant   Telemetry: atrial flutter 90-110  Labs: Basic Metabolic Panel:  Recent Labs Lab 01/16/13 0500 01/17/13 0505 01/18/13 0500 01/19/13 0500 01/20/13 0430  NA 137 134* 136 138 139  K 4.0 3.4* 3.5 3.4* 4.3  CL 86* 85* 87* 89* 92*  CO2 44* 41* 42* 41* 38*  GLUCOSE 123* 132* 116* 81 124*  BUN 65* 70* 75* 77* 79*  CREATININE 2.79* 2.87* 2.62* 2.58* 2.74*  CALCIUM 9.6 9.6 10.0 10.5 9.6    Liver Function Tests: No results found for this basename: AST, ALT, ALKPHOS, BILITOT, PROT, ALBUMIN,  in the last 168 hours No results found for this basename: LIPASE, AMYLASE,  in the last 168 hours No results found for this basename: AMMONIA,  in the last 168 hours  CBC:  Recent Labs Lab 01/16/13 0500 01/17/13 0505 01/18/13 0500 01/19/13 0500 01/20/13 0430  WBC 4.8 4.8 5.8 5.3 4.4  HGB 9.4* 9.1* 9.6* 9.9* 8.7*  HCT 31.5* 29.9* 30.2* 31.5* 27.9*  MCV 89.2 88.7 85.8 87.0 87.5  PLT  130* 128* 139* 159 130*    Cardiac Enzymes: No results found for this basename: CKTOTAL, CKMB, CKMBINDEX, TROPONINI,  in the last 168 hours  BNP: BNP (last 3 results)  Recent Labs  01/03/13 0800  PROBNP 4706.0*      Imaging: No results found.   Medications:     Scheduled Medications: . allopurinol  100 mg Oral Daily  . amiodarone  400 mg Oral BID  . busPIRone  5 mg Oral Q12H  . carbamide peroxide  5 drop Both Ears BID  . carvedilol  12.5 mg Oral BID WC  . docusate sodium  100 mg Oral Q8H  . feeding supplement (NEPRO CARB STEADY)  237 mL Oral BID BM  . guaiFENesin  600 mg Oral BID  . insulin aspart   0-5 Units Subcutaneous QHS  . insulin aspart  0-9 Units Subcutaneous TID WC  . insulin glargine  25 Units Subcutaneous QHS  . isosorbide-hydrALAZINE  1 tablet Oral BID  . metroNIDAZOLE  500 mg Oral Q8H  . miconazole  1 Applicatorful Vaginal QHS  . OxyCODONE  10 mg Oral Q12H  . pantoprazole  40 mg Oral Daily  . polyethylene glycol  17 g Oral BID  . sodium chloride  3 mL Intravenous Q12H  . [START ON 01/21/2013] torsemide  60 mg Oral Daily  . traZODone  50 mg Oral QHS  . warfarin  3 mg Oral q1800  . Warfarin - Pharmacist Dosing Inpatient   Does not apply q1800    Infusions: . sodium chloride 250 mL (01/20/13 0641)    PRN Medications: acetaminophen, albuterol, alum & mag hydroxide-simeth, diphenhydrAMINE, lactulose, nitroGLYCERIN, ondansetron (ZOFRAN) IV, oxyCODONE, simethicone, sodium chloride, sodium chloride   Assessment:   1. Acute on chronic right sided heart failure  2. Acute on chronic respiratory failure  3. Acute on chronic renal failure.  4. HTN  5. Obesity, suspect OHS/OSA  6. Chronic atrial flutter  - on coumadin  7. Deconditioning 8. Metabolic alkalosis 9. Hypokalemia 10. Hypotension, resolved  Plan/Discussion:    Diuresed net 1.3 L yesterday off of milrinone/dopamine.  Cr up to 2.74.   - Will hold torsemide today due to rise in Cr, probably restart tomorrow.  - Cardiorenal syndrome: I am not sure that she will be able to keep volume off just on diuretics.  I would not put her on a home inotrope.  She is not HD candidate (could not physically get to sessions).  It may be time to involve palliative care.   Atrial flutter is persistent on amio for rate control.  TEE done but thick smoke in [place] and ? Small thrombus.  Think the safest plan at this point will be 4 weeks therapeutic INR, continue loading amiodarone, and re-attempt cardioversion in 4 wks. Amiodarone to 400 mg daily.   Disposition: back to SNF hopefully tomorrow or next day if Cr stabalizes with follow  up in the HF clinic.  Gina Ashley 01/20/2013 7:52 AM Length of Stay: 18

## 2013-01-20 NOTE — Progress Notes (Signed)
Chart review complete.  Patient is not eligible for [facility] Care Management services because her PCP is not a [facility] primary care provider or is not [facility] affiliated.  For any additional questions or new referrals please contact Anibal Henderson BSN RN [facility] Liaison at [phone]

## 2013-01-20 NOTE — Progress Notes (Signed)
No change in d/c plan at this time. Patient has an available bed at [facility] which is the facility of choice for the family. They have toured the facility.  Awaiting OK per cardiology.  Palliative care has scheduled a Goals of Care meeting with the family/patient for tomorrow.  Will proceed with d/c to SNF when medically stable per MD.  Lupita Leash T. [name]  [phone]

## 2013-01-20 NOTE — Progress Notes (Signed)
Thank you for consulting the Palliative Medicine Team at [facility] to meet your patient's and family's needs.   The reason that you asked [place] to see your patient is for goals of care discussion, hospice options  We have scheduled your patient for a meeting:  Thursday 01/21/13 @ 2:00 pm  The Surrogate decision maker is: daughter [name], though patient on this visit is alert, oriented and able to participate in discussion about care Contact information: [phone]  Other family members that need to be present: none, per daughter, [name]  Your patient is able/unable to participate: patient is able to participate in discussion   Valente David, RN 01/20/2013, 2:51 PM Palliative Medicine Team RN Liaison [phone]

## 2013-01-21 DIAGNOSIS — R627 Adult failure to thrive: Secondary | ICD-10-CM

## 2013-01-21 DIAGNOSIS — R5383 Other fatigue: Secondary | ICD-10-CM

## 2013-01-21 DIAGNOSIS — R531 Weakness: Secondary | ICD-10-CM

## 2013-01-21 LAB — GLUCOSE, CAPILLARY: Glucose-Capillary: 130 mg/dL — ABNORMAL HIGH (ref 70–99)

## 2013-01-21 LAB — BASIC METABOLIC PANEL
BUN: 81 mg/dL — ABNORMAL HIGH (ref 6–23)
Chloride: 91 mEq/L — ABNORMAL LOW (ref 96–112)
GFR calc Af Amer: 17 mL/min — ABNORMAL LOW (ref >90)
Glucose, Bld: 104 mg/dL — ABNORMAL HIGH (ref 70–99)
Potassium: 3.9 mEq/L (ref 3.5–5.1)
Sodium: 137 mEq/L (ref 135–145)

## 2013-01-21 LAB — PROTIME-INR: INR: 2.56 — ABNORMAL HIGH (ref 0.00–1.49)

## 2013-01-21 LAB — CBC
MCV: 85.4 fL (ref 78.0–100.0)
Platelets: 135 10*3/uL — ABNORMAL LOW (ref 150–400)
RBC: 3.42 MIL/uL — ABNORMAL LOW (ref 3.87–5.11)
WBC: 4.3 10*3/uL (ref 4.0–10.5)

## 2013-01-21 LAB — URINE CULTURE

## 2013-01-21 MED ORDER — FOSFOMYCIN TROMETHAMINE 3 G PO PACK
3.0000 g | PACK | ORAL | Status: DC
  Administered 2013-01-21: 3 g via ORAL
  Filled 2013-01-21: qty 3

## 2013-01-21 MED ORDER — VANCOMYCIN 50 MG/ML ORAL SOLUTION
125.0000 mg | Freq: Four times a day (QID) | ORAL | Status: DC
  Administered 2013-01-21 – 2013-01-23 (×8): 125 mg via ORAL
  Filled 2013-01-21 (×12): qty 2.5

## 2013-01-21 NOTE — Progress Notes (Signed)
[facility] PROGRESS NOTE  Gina Ashley AVW:[id number] DOB: 1927-09-22 DOA: 01/02/2013 PCP: Ernestine Conrad, MD  Assessment/Plan: Principal Problem:   Shortness of breath Active Problems:   DM   OBSTRUCTIVE SLEEP APNEA   HYPERTENSION   GERD   Persistent Atrial flutter   COPD (chronic obstructive pulmonary disease)   Anemia due to chronic illness   CKD (chronic kidney disease)   Chronic diastolic heart failure   Obesity (BMI 30-39.9)   Depression   Cardiorenal syndrome with renal failure   Hypokalemia   Acute on chronic diastolic heart failure   Enteritis due to Clostridium difficile    1. Acute right heart congestive heart failure: Patient presented with worsening edema, increasing abdominal girth and SOB, despite iv Lasix. Clinically, she was found to be anasarcic and felt to have acute on chronic right sided HF and marked volume overload on exam. Dr Marca Ancona provided cardiology consultation, reviewed 2D echocardiogram, which showed poorly visualized moderately dilated RV with moderately decreased systolic function and mildly left-shifted IV septum. Patient was initially managed with iv Milrinone, as well as aggressive diuresis. Dr Reuel Boom Bensimhon/Heart Failure team followed subsequently, and were extremely helpful in rationalizing heart failure treatment. Clinically, patient has responded satisfactorily to treatment, and as of 01/15/13, was feeling considerably better. Diuretics were discontinued on 01/14/13, due to hypotension, and patient administered a bolus of NS. BP is satisfactory at this time. Diuretics were placed on hold till 01/17/13, but as creatinine has failed to stabilize, and weight is going up, Dr Graciela Husbands on 01/17/13 restarted Milrinone/Dopamine. Dr Delano Metz was re-consulted on 01/17/13, for possible HD, but has opined that patient is not a suitable candidate, due to her severe co-morbidities. Dr Marca Ancona had planned TEE/DCCV on 01/19/13, but TEE revealed thick  smoke in [place] and ? Small thrombus. He has opined that the safest plan at this point, will be 4 weeks therapeutic INR, continue loading Amiodarone, and re-attempt cardioversion in 4 wks. Managing as recommended. Dopamine was discontinued again on 01/19/13. She has been restarted on torsemide, will need to follow urine output and renal indices. 2. Acute on chronic kidney disease stage 3/cardio-renal syndrome: Patient's baseline creatinine was 1.14 on 09/15/12. Creatinine was 1.65 at presentation, and gradually climbed, reaching a high of 2.20 on 01/14/13. GFR now in CKD stage 4 range. In the initial few days of hospitalization, she was managed with iv low dose Dopamine. This was discontinued on 01/14/13. Following renal indices. Nephrology consultation was provided by Dr Delano Metz, and he was again re-consulted on 01/17/13. Patient is considered a poor candidate for RRT, due to her co-morbidities. The discussion in #1 above is pertinent. Creatinine trending up.  Restarted on torsemide today. 3. PAF (paroxysmal atrial fibrillation), was in SR 06/2012: Patient has a known history of chronic atrial fibrillation/flutter and has failed DCCV in the past. Managed with Coreg and Amiodarone, with satisfactory rate control. On Coumadin per pharmacy, and INR remains therapeutic.   4. Hypokalemia: Likely due to diuretics. Repleted as indicated. Following Lytes.  5. DM with complications (CKD), controlled: Hemoglobin A1c on this admission 6.8. Managing with diet, SSI and Lantus, with reasonable control.   6. HTN: Patient has a known history of HTN, but during this hospitalization, BP has been borderline to low. On Bidil and Coreg (dose lowered this admit, secondary to hypotension), with holding parameters orders. Patient appears to be tolerating borderline BP well.   7. Depression: Stable on Buspirone.  8. Anemia: Patient has a mild normocytic anemia,  due to chronic illness. HB has remained stable/reasonable.  9. OSA: Known  history of OSA/OHS: Managing with nocturnal CPAP, and appear stable from this view point as of 01/15/13, although as discussed in #1, she presented with right heart failure. 10. Physical deconditioning: Patient is significantly deconditioned, due to multiple co-morbidites, multiple hospitalizations over past several months and recent acute illness. PT/OT following. Plan discharge to [facility], when stable.   11. C. Difficile infection: Patient had a brief episode of diarrhea on 01/05/13. C.Difficile PCR is positive. She has been commenced on a 10-day course of Flagyl, effective 01/16/13 now day #6.  12. UTI: U/A of 01/16/13, demonstrated significant pyuria and bacteriuria. Culture is positive for GNR. On 3-day course of Ciprofloxacin (avoiding long course, due to C. Difficile infection), concluded on 01/18/13. Would not treat with further antibiotics since klebsiella in urine is likely a colonization. 13: Right ear discomfort: Patient complained of discomfort in her right ear on 01/18/13. Otoscopic examination revealed small amount of impacted cerumen. Debrox ordered.  14. Discussion.  With patient's cardiorenal syndrome and multiple co morbidities, it is proving challenging to adequately diurese her, without significantly impairing her renal function.  She is not a candidate for hemodialysis.  She has had multiple admissions over the last 6 months for volume related issues.  It appears that patient may not be able to maintain her volume status outside the hospital. Cardiology has recommended palliative care consult to further discuss goals of care, and I think this is reasonable. Goals of care meeting has been set up for today.    Code Status: Limited code blue. No CPR or intubation but OK with anti-arrhythmic meds, defib/cardiovert and vasoactive drugs.  Family Communication:  Disposition Plan: To be determined. For SNF, when OK-ed by cardiology.    Brief narrative: 79 y.o. female with history of GERD,  chronic back pain, Hiatal hernia, HTN, CKD stage III; Atrial fibrillation, OSA on CPAP, Depression, Chronic diastolic heart failure (08/31/2012), Tibia/Fibula fracture s/p surgical repair with post-op course complicated by ARF, requiring temporary HD, COPD, Obesity Hypoventilation syndrome, chronic respiratory failure. Following her last hospitalization, she was discharged to [facility] and has been living there since then. Family states that 1 month ago she had her perma-cath was discontinued. For the past 6 days, she has had worsening edema with increasing abdominal girth and SOB, despite iv Lasix. She was sent to [facility] ED for further treatment, and was admitted.   Consultants:  Dr Arvilla Meres, cardiologist.  Dr dalton Shirlee Latch, cardiologist.   Dr Johny Sax, ID.  Dr Delano Metz, nephrologist.   Procedures:  CXRs  2D Echocardiogram     Antibiotics:  N/A.   HPI/Subjective: She is feeling better today.  Feels shortness of breath is improving.  Still feels that lower extremities are tight with edema.  Objective: Vital signs in last 24 hours: Temp:  [98.1 F (36.7 C)] 98.1 F (36.7 C) (03/20 0407) Pulse Rate:  [68-81] 68 (03/20 1024) Resp:  [16] 16 (03/20 1024) BP: (103-121)/(65-73) 103/66 mmHg (03/20 1024) SpO2:  [91 %-98 %] 95 % (03/20 1024) Weight:  [94.484 kg (208 lb 4.8 oz)] 94.484 kg (208 lb 4.8 oz) (03/20 0407) Weight change: -5.035 kg (-11 lb 1.6 oz) Last BM Date: 01/20/13  Intake/Output from previous day: 03/19 0701 - 03/20 0700 In: 960 [P.O.:960] Out: 1261 [Urine:1260; Stool:1] Total I/O In: 180 [P.O.:180] Out: 175 [Urine:175]   Physical Exam: General: Comfortable, alert, communicative, fully oriented, not short of breath at rest.  HEENT:  Mild clinical pallor, no jaundice, no conjunctival injection or discharge.  NECK:  Supple, JVP not seen, no carotid bruits, no palpable lymphadenopathy, no palpable goiter. CHEST:  Fine crackles in  bases. HEART:  Sounds 1 and 2 heard, normal, regular, no murmurs. ABDOMEN:  Obese, soft, non-tender, no palpable organomegaly, no palpable masses, normal bowel sounds. GENITALIA:  Not examined. LOWER EXTREMITIES:  Moderate pitting edema, bilateral LLE.  MUSCULOSKELETAL SYSTEM:  Generalized osteoarthritic changes, otherwise, normal. CENTRAL NERVOUS SYSTEM:  No focal neurologic deficit on gross examination.  Lab Results:  Recent Labs  01/20/13 0430 01/21/13 0600  WBC 4.4 4.3  HGB 8.7* 9.3*  HCT 27.9* 29.2*  PLT 130* 135*    Recent Labs  01/20/13 0430 01/21/13 0600  NA 139 137  K 4.3 3.9  CL 92* 91*  CO2 38* 38*  GLUCOSE 124* 104*  BUN 79* 81*  CREATININE 2.74* 2.78*  CALCIUM 9.6 9.7   Recent Results (from the past 240 hour(s))  URINE CULTURE     Status: None   Collection Time    01/16/13  2:30 AM      Result Value Range Status   Specimen Description URINE, CATHETERIZED   Final   Special Requests CX ADDED AT 0324 ON [id number]   Final   Culture  Setup Time 01/16/2013 11:18   Final   Colony Count >=100,000 COLONIES/ML   Final   Culture     Final   Value: KLEBSIELLA PNEUMONIAE     Note: COLISTINE 0.125 ug/ml ETEST results for this drug are "FOR INVESTIGATIONAL USE ONLY" and should NOT be used for clinical purposes. THIS ISOLATE PREVIOUSLY CONFIRMED AS [facility] CARBAPENEMASE PRODUCER CRITICAL RESULT CALLED TO, READ BACK BY AND VERIFIED      WITH: TIMED [email or web address]  ON [id number] BY [facility] CALLED INFECTION CONTROL PEGGY DANIELLA [phone] [id number] BY [facility]   Report Status 01/21/2013 FINAL   Final   Organism ID, Bacteria KLEBSIELLA PNEUMONIAE   Final  CLOSTRIDIUM DIFFICILE BY PCR     Status: Abnormal   Collection Time    01/16/13  3:35 AM      Result Value Range Status   C difficile by pcr POSITIVE (*) NEGATIVE Final   Comment: CRITICAL RESULT CALLED TO, READ BACK BY AND VERIFIED WITH:     HANEY R.,RN 01/16/13 0842 BY JONESJ     Studies/Results: Dg Knee 1-2 Views Left  01/20/2013   *RADIOLOGY REPORT*  Clinical Data: Left knee pain and swelling.  History of old trauma.  LEFT KNEE - 1-2 VIEW  Comparison: CT 12/01/2012.  Fluoroscopy 11/30/2012.  Left tibia and fibula 11/21/2012.  Left tibia and fibula 08/27/2012.  Findings: There are postoperative changes with left total knee arthroplasty and patellofemoral component.  There are pin tracks in the distal femoral and proximal tibial shaft.  There is a displaced fracture of the proximal left tibial metaphysis which has been present previously and is now demonstrating a healing with sclerosis in the fracture line and loss of distinction of the fracture line.  There is residual lateral displacement angulation of the distal fracture fragments.  Alignment and position is similar to previous studies.  There is no significant effusion.  No acute fractures are demonstrated.  IMPRESSION: Old postoperative and post-traumatic changes in the left knee as described.  There is an old healing fracture of the tibial metaphysis with line in position similar to previous study.  Left knee arthroplasty.  Pin tracks in the tibia and fibula.  No acute fractures  are suggested.   Original Report Authenticated By: Burman Nieves, M.D.     Medications: Scheduled Meds: . allopurinol  100 mg Oral Daily  . amiodarone  400 mg Oral Daily  . busPIRone  5 mg Oral Q12H  . carvedilol  12.5 mg Oral BID WC  . docusate sodium  100 mg Oral Q8H  . feeding supplement (NEPRO CARB STEADY)  237 mL Oral BID BM  . fosfomycin  3 g Oral Q3 days  . guaiFENesin  600 mg Oral BID  . insulin aspart  0-5 Units Subcutaneous QHS  . insulin aspart  0-9 Units Subcutaneous TID WC  . insulin glargine  18 Units Subcutaneous QHS  . isosorbide-hydrALAZINE  1 tablet Oral BID  . miconazole  1 Applicatorful Vaginal QHS  . OxyCODONE  10 mg Oral Q12H  . pantoprazole  40 mg Oral Daily  . polyethylene glycol  17 g Oral BID  . sodium chloride  3 mL Intravenous Q12H  . torsemide  60 mg Oral Daily   . traZODone  50 mg Oral QHS  . vancomycin  125 mg Oral Q6H  . warfarin  3 mg Oral q1800  . Warfarin - Pharmacist Dosing Inpatient   Does not apply q1800   Continuous Infusions: . sodium chloride 250 mL (01/21/13 0852)   PRN Meds:.acetaminophen, albuterol, alum & mag hydroxide-simeth, diphenhydrAMINE, lactulose, nitroGLYCERIN, ondansetron (ZOFRAN) IV, oxyCODONE, simethicone, sodium chloride, sodium chloride    LOS: 19 days   Abayomi Pattison  [facility] Pager [phone]. If 8PM-8AM, please contact night-coverage at [email or web address], password [facility] 01/21/2013, 1:34 PM  LOS: 19 days
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
## 2013-01-21 NOTE — Consult Note (Signed)
Patient Gina Ashley      DOB: [birth year]      UJW:[id number]     Consult Note from the Palliative Medicine Team at [facility]    Consult Requested by: Dr Kerry Hough     PCP: Ernestine Conrad, MD Reason for Consultation:Clarification of GOC and options     Phone Number:[phone]  Assessment of patients Current state:79 yo female with continued decline over the past six months in presence of CKD, CHF, DM, COPD, obesity, atrial -fib, decrease in functional status secondary to multiple fx And overall failure to thrive--processing decisions for future medical care  Goals of Care: 1.  Code Status: Partial  -no CPR, compression, intubation   2. Scope of Treatment: 1. Vital Signs: per unit  2. Respiratory/Oxygen: as needed for treatment 3. Nutritional Support/Tube Feeds: no artifical feeding now or in the future 4. Antibiotics:yes 5. Blood Products:yes 6. IVF:yes 7. Review of Medications to be discontinued:continue present medications 8. Labs: yes 9. Telemetry:yes 10. Consults:  Pallitive to    4. Disposition: Disposition to SNF in am with Palliative Care to follow   3. Symptom Management:   1. Weakness: continue to work with PT/OT 2.                     Demonstrated AROM exercises/encouraged 3. Pain: Oxycontin 10 mg every 12 hrs                 Oxycodone 10 every 4 hrs prn  4. Bowel Regimen: Miralax  4. Psychosocial:  Emotional support offered to patient and daughter at bedside.  This has been a difficult past six months medically.  Both are encouraged to continue to process her medical situation and make choices based on realistic medical interventions     Patient Documents Completed or Given: Document Given Completed  Advanced Directives Pkt    MOST yes   DNR    Gone from My Sight    Hard Choices  yes    Brief HPI:79 yo female with continued decline over the past six months in presence of CKD, CHF, DM, COPD, obesity, atrial -fib, decrease in functional status  secondary to multiple fx And overall failure to thrive--processing decisions for future medical care     ROS: weakness, fatigue    PMH:  Past Medical History  Diagnosis Date  . GERD (gastroesophageal reflux disease)   . Back pain, chronic   . Pain in shoulder   . Pain in ankle   . Hiatal hernia   . HTN (hypertension)     all her life  . CKD (chronic kidney disease), stage III     GFR: 38  . Atrial fibrillation     since 1996  . Sleep apnea   . Fall 3 weeks ago    was evaluated at [facility]  . OSA on CPAP   . Depression   . PONV (postoperative nausea and vomiting)     difficult to wake up  . Chronic diastolic heart failure 08/31/2012  . Tibia fracture     BILATERAL  . Renal failure     acute on chronic   . Fibula fracture     bilateral  . Respiratory failure     acute on chronic hx of  requiring BIPAP  . COPD (chronic obstructive pulmonary disease)   . Metabolic encephalopathy     hx of   . Renal failure     acute on chronic with need for intermittent dialysis -  hx of   . UTI (urinary tract infection)     hx of   . Pleural effusion     right sided now resolved   . Diabetes mellitus     x 15 yrs, hx of hypoglycemia   . CHF (congestive heart failure)     diastolic HF     PSH: Past Surgical History  Procedure Laterality Date  . Bunionectomy      bilateral  . Cholecystectomy    . Rotator cuff repair      2002  . Total knee arthroplasty      bilateral 2001  . Hammer toe surgery      2004  . Colonoscopy  07/11/2011    Procedure: COLONOSCOPY;  Surgeon: Malissa Hippo, MD;  Location: [facility];  Service: Endoscopy;  Laterality: N/A;  3:00  . Orif periprosthetic fracture  09/08/2012    Procedure: OPEN REDUCTION INTERNAL FIXATION (ORIF) PERIPROSTHETIC FRACTURE;  Surgeon: Shelda Pal, MD;  Location: [facility] ORS;  Service: Orthopedics;  Laterality: Right;  ORIF periprosthetic right proximal femur fracture Spanning external fixator left distal tibia  .  External fixation leg  09/08/2012    Procedure: EXTERNAL FIXATION LEG;  Surgeon: Shelda Pal, MD;  Location: [facility] ORS;  Service: Orthopedics;  Laterality: Left;  . Joint replacement      bilateral knees   . External fixation leg  11/30/2012    Procedure: EXTERNAL FIXATION LEG;  Surgeon: Shelda Pal, MD;  Location: [facility] ORS;  Service: Orthopedics;  Laterality: Left;  Removal of External Fixation Left Knee with Evaluation with Floroscopy  . Tee without cardioversion N/A 01/19/2013    Procedure: TRANSESOPHAGEAL ECHOCARDIOGRAM (TEE);  Surgeon: Lewayne Bunting, MD;  Location: [facility] ENDOSCOPY;  Service: Cardiovascular;  Laterality: N/A;   I have reviewed the FH and SH and  If appropriate update it with new information. Allergies  Allergen Reactions  . Morphine Sulfate Other (See Comments)    REACTION: change in personality  . Remeron (Mirtazapine) Other (See Comments)    Altered mental status, lethargy  . Tuna (Fish Allergy) Other (See Comments)    unknown  . Penicillins Rash    Tolerates Ancef.   Scheduled Meds: . allopurinol  100 mg Oral Daily  . amiodarone  400 mg Oral Daily  . busPIRone  5 mg Oral Q12H  . carvedilol  12.5 mg Oral BID WC  . docusate sodium  100 mg Oral Q8H  . feeding supplement (NEPRO CARB STEADY)  237 mL Oral BID BM  . fosfomycin  3 g Oral Q3 days  . guaiFENesin  600 mg Oral BID  . insulin aspart  0-5 Units Subcutaneous QHS  . insulin aspart  0-9 Units Subcutaneous TID WC  . insulin glargine  18 Units Subcutaneous QHS  . isosorbide-hydrALAZINE  1 tablet Oral BID  . miconazole  1 Applicatorful Vaginal QHS  . OxyCODONE  10 mg Oral Q12H  . pantoprazole  40 mg Oral Daily  . polyethylene glycol  17 g Oral BID  . sodium chloride  3 mL Intravenous Q12H  . torsemide  60 mg Oral Daily  . traZODone  50 mg Oral QHS  . vancomycin  125 mg Oral Q6H  . warfarin  3 mg Oral q1800  . Warfarin - Pharmacist Dosing Inpatient   Does not apply q1800   Continuous Infusions: . sodium  chloride 250 mL (01/21/13 0852)   PRN Meds:.acetaminophen, albuterol, alum & mag hydroxide-simeth, diphenhydrAMINE, lactulose, nitroGLYCERIN, ondansetron (  ZOFRAN) IV, oxyCODONE, simethicone, sodium chloride, sodium chloride    BP 125/72  Pulse 87  Temp(Src) 97.4 F (36.3 C) (Oral)  Resp 18  Ht 5\' 3"  (1.6 m)  Wt 94.484 kg (208 lb 4.8 oz)  BMI 36.91 kg/m2  SpO2 96%   PPS:30% at best   Intake/Output Summary (Last 24 hours) at 01/21/13 1511 Last data filed at 01/21/13 1400  Gross per 24 hour  Intake 1188.18 ml  Output    985 ml  Net 203.18 ml     Physical Exam:  General:  NAD HEENT:  moist buccal membranes, no exudate noted Chest:   Diminished in bases, CTA CVS: irregular rate, no murmur noted Abdomen: soft NT +BS Ext: BLE with +2 edema, limited ROM and strength Neuro: alert and oriented X3  Labs: CBC    Component Value Date/Time   WBC 4.3 01/21/2013 0600   RBC 3.42* 01/21/2013 0600   HGB 9.3* 01/21/2013 0600   HCT 29.2* 01/21/2013 0600   PLT 135* 01/21/2013 0600   MCV 85.4 01/21/2013 0600   MCH 27.2 01/21/2013 0600   MCHC 31.8 01/21/2013 0600   RDW 16.0* 01/21/2013 0600   LYMPHSABS 0.9 01/02/2013 1511   MONOABS 0.3 01/02/2013 1511   EOSABS 0.1 01/02/2013 1511   BASOSABS 0.0 01/02/2013 1511    BMET    Component Value Date/Time   NA 137 01/21/2013 0600   K 3.9 01/21/2013 0600   CL 91* 01/21/2013 0600   CO2 38* 01/21/2013 0600   GLUCOSE 104* 01/21/2013 0600   BUN 81* 01/21/2013 0600   CREATININE 2.78* 01/21/2013 0600   CALCIUM 9.7 01/21/2013 0600   GFRNONAA 15* 01/21/2013 0600   GFRAA 17* 01/21/2013 0600    CMP     Component Value Date/Time   NA 137 01/21/2013 0600   K 3.9 01/21/2013 0600   CL 91* 01/21/2013 0600   CO2 38* 01/21/2013 0600   GLUCOSE 104* 01/21/2013 0600   BUN 81* 01/21/2013 0600   CREATININE 2.78* 01/21/2013 0600   CALCIUM 9.7 01/21/2013 0600   PROT 7.1 01/03/2013 0800   ALBUMIN 4.2 01/03/2013 0800   AST 20 01/03/2013 0800   ALT 12 01/03/2013 0800   ALKPHOS 163*  01/03/2013 0800   BILITOT 0.8 01/03/2013 0800   GFRNONAA 15* 01/21/2013 0600   GFRAA 17* 01/21/2013 0600      Time In Time Out Total Time Spent with Patient Total Overall Time  1400 1520 70 min 85 min    Greater than 50%  of this time was spent counseling and coordinating care related to the above assessment and plan.  Discussed with Lovette Cliche LCSW   Lorinda Creed NP  Palliative Medicine Team Team Phone # [phone] Pager [phone]

## 2013-01-21 NOTE — Progress Notes (Signed)
Physical Therapy Treatment Patient Details Name: Gina Ashley MRN: [id number] DOB: 1927-07-25 Today's Date: 01/21/2013 Time: 5784-6962 PT Time Calculation (min): 39 min  PT Assessment / Plan / Recommendation Comments on Treatment Session  Pt with SOB, CHF, anasarca with LLE fx from [facility] with persist generalized weakness and particularly weak left dorsiflexors. Daughter notes ortho possibly in to see patient to determine WB status of LLE.     Follow Up Recommendations  SNF     Does the patient have the potential to tolerate intense rehabilitation     Barriers to Discharge        Equipment Recommendations  Wheelchair (measurements PT);Wheelchair cushion (measurements PT)    Recommendations for Other Services    Frequency Min 2X/week   Plan Discharge plan remains appropriate;Frequency remains appropriate    Precautions / Restrictions Precautions Required Braces or Orthoses: Knee Immobilizer - Left Knee Immobilizer - Left: On when out of bed or walking Restrictions Weight Bearing Restrictions: Yes LLE Weight Bearing: Non weight bearing   Pertinent Vitals/Pain Reports 4/10 back pain, RN made aware and meds provided    Mobility  Bed Mobility Bed Mobility: Supine to Sit;Sitting - Scoot to [place] of Bed;Sit to Supine;Rolling Right Rolling Right: 3: Mod assist;With rail Rolling Left: 3: Mod assist;With rail Supine to Sit: 3: Mod assist;With rails;Other (comment) Sitting - Scoot to Edge of Bed: 3: Mod assist Sit to Supine: 3: Mod assist;With rail;HOB flat;Other (comment) Scooting to HOB: 1: +2 Total assist Scooting to [facility]: Patient Percentage: 30% Details for Bed Mobility Assistance: cues for sequencing, assist for follow through Transfers Transfers: Lateral/Scoot Transfers Details for Transfer Assistance: pt performed lateral scoot up the head of the bed because she didn't want to transfer to the recliner today; mod verbal and faciliatory sequencing cues as well as use of pad to  assist with scooting Ambulation/Gait Ambulation/Gait Assistance: Not tested (comment)    Exercises General Exercises - Lower Extremity Ankle Circles/Pumps: AROM;Both;20 reps;Supine Straight Leg Raises: AAROM;Both;10 reps;Supine     PT Goals Acute Rehab PT Goals PT Goal: Rolling Supine to Right Side - Progress: Progressing toward goal PT Goal: Rolling Supine to Left Side - Progress: Progressing toward goal PT Goal: Supine/Side to Sit - Progress: Progressing toward goal PT Goal: Sit at Edge Of Bed - Progress: Progressing toward goal PT Goal: Sit to Supine/Side - Progress: Progressing toward goal PT Transfer Goal: Bed to Chair/Chair to Bed - Progress: Progressing toward goal  Visit Information  Last PT Received On: 01/21/13 Assistance Needed: +2    Subjective Data  Subjective: I don't think I can get up tot  he chair, Im too lightheaded.   Cognition  Cognition Overall Cognitive Status: Appears within functional limits for tasks assessed/performed Arousal/Alertness: Awake/alert Orientation Level: Appears intact for tasks assessed Behavior During Session: [facility] for tasks performed    Balance  Static Sitting Balance Static Sitting - Level of Assistance: 5: Stand by assistance Static Sitting - Comment/# of Minutes: sat EOB 10 minutes with inital complaints of light headedness, BP checked and it was [facility], good balance today, did fall posteriorly several times needing assist to correct  End of Session     GP     [facility] HELEN 01/21/2013, 2:48 PM

## 2013-01-21 NOTE — Progress Notes (Signed)
ANTICOAGULATION CONSULT NOTE - Follow Up Consult  Pharmacy Consult for Warfarin Indication: atrial fibrillation   Recent Labs  01/19/13 0500 01/20/13 0430 01/21/13 0600  HGB 9.9* 8.7* 9.3*  HCT 31.5* 27.9* 29.2*  PLT 159 130* 135*  LABPROT 23.9* 26.0* 26.3*  INR 2.25* 2.52* 2.56*  CREATININE 2.58* 2.74* 2.78*   Estimated Creatinine Clearance: 16.8 ml/min (by C-G formula based on Cr of 2.78).  Assessment:  79yo female continues on chronic coumadin for atrial fibrillation.  INR continues to be therapeutic at 2.56 today on home dose of 3mg  daily. Patient completed 6 dose course of ciprofloxacin 3/18, amiodarone dose changed to 400mg  daily 3/19, and was just on 5 days of metronidazole (changed to PO vancomycin).  Hgb improved today. No bleeding reported.  Goal: INR 2-3  Plan:  1) Continue coumadin 3mg  daily 2) Follow up INR in AM   Louie Casa, PharmD, BCPS 01/21/2013   12:18 PM

## 2013-01-21 NOTE — Progress Notes (Signed)
Per MD note- possible d/c tomorrow to SNF. Bed has been confirmed available at [facility] for patient. CSW spoke with patient's daughter who agreed with this plan.  There is a palliative care conference today at 2pm per daughter.  CSW will assist with d/c once MD determines stability.  Lorri Frederick. [name]  [phone]

## 2013-01-22 LAB — CBC
MCH: 27.5 pg (ref 26.0–34.0)
MCHC: 32.1 g/dL (ref 30.0–36.0)
MCV: 85.7 fL (ref 78.0–100.0)
Platelets: 126 10*3/uL — ABNORMAL LOW (ref 150–400)
RBC: 3.42 MIL/uL — ABNORMAL LOW (ref 3.87–5.11)
RDW: 16.1 % — ABNORMAL HIGH (ref 11.5–15.5)

## 2013-01-22 LAB — GLUCOSE, CAPILLARY
Glucose-Capillary: 137 mg/dL — ABNORMAL HIGH (ref 70–99)
Glucose-Capillary: 125 mg/dL — ABNORMAL HIGH (ref 70–99)

## 2013-01-22 LAB — BASIC METABOLIC PANEL
BUN: 84 mg/dL — ABNORMAL HIGH (ref 6–23)
CO2: 37 mEq/L — ABNORMAL HIGH (ref 19–32)
Calcium: 9.9 mg/dL (ref 8.4–10.5)
Creatinine, Ser: 2.88 mg/dL — ABNORMAL HIGH (ref 0.50–1.10)
GFR calc non Af Amer: 14 mL/min — ABNORMAL LOW (ref >90)
Glucose, Bld: 96 mg/dL (ref 70–99)
Sodium: 138 mEq/L (ref 135–145)

## 2013-01-22 LAB — PROTIME-INR: Prothrombin Time: 27.7 seconds — ABNORMAL HIGH (ref 11.6–15.2)

## 2013-01-22 MED ORDER — TORSEMIDE 20 MG PO TABS: 60.0000 mg | ORAL_TABLET | Freq: Every day | ORAL | Status: DC

## 2013-01-22 MED ORDER — AMIODARONE HCL 200 MG PO TABS: 400.0000 mg | ORAL_TABLET | Freq: Every day | ORAL | Status: DC

## 2013-01-22 MED ORDER — VANCOMYCIN 50 MG/ML ORAL SOLUTION: 125.0000 mg | Freq: Four times a day (QID) | ORAL | Status: DC

## 2013-01-22 MED ORDER — ISOSORB DINITRATE-HYDRALAZINE 20-37.5 MG PO TABS: 1.0000 | ORAL_TABLET | Freq: Two times a day (BID) | ORAL | Status: DC

## 2013-01-22 NOTE — Clinical Social Work Note (Signed)
CSW confirmed SNF bed available for Pt on Saturday at [facility] if Pt is medically ready. CSW sent SNF dc summary and Pt's daughter is at SNF doing admission paperwork. Weekend CSW will f/u to facilitate dc on Saturday if Pt is medically cleared for dc.    Frederico Hamman, LCSW [phone]
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
## 2013-01-22 NOTE — Progress Notes (Signed)
ANTICOAGULATION CONSULT NOTE - Follow Up Consult  Pharmacy Consult for Warfarin Indication: atrial fibrillation   Recent Labs  01/20/13 0430 01/21/13 0600 01/22/13 0500  HGB 8.7* 9.3* 9.4*  HCT 27.9* 29.2* 29.3*  PLT 130* 135* 126*  LABPROT 26.0* 26.3* 27.7*  INR 2.52* 2.56* 2.75*  CREATININE 2.74* 2.78* 2.88*   Estimated Creatinine Clearance: 16.5 ml/min (by C-G formula based on Cr of 2.88).  Assessment:  79yo female continues on chronic coumadin for atrial fibrillation.  INR continues to be therapeutic at 2.75 today on home dose of 3mg  daily. Patient completed 6 dose course of ciprofloxacin 3/18, amiodarone dose changed to 400mg  daily 3/19, and was just on 5 days of metronidazole (changed to PO vancomycin 3/20). CBC stable. No bleeding reported.  Goal: INR 2-3  Plan:  1) Continue coumadin 3mg  daily 2) Follow up INR in AM  Louie Casa, PharmD, BCPS 01/22/2013   10:03 AM

## 2013-01-22 NOTE — Progress Notes (Signed)
Patient ID: Gina Ashley, female   DOB: [birth year], 79 y.o.   MRN: [id number]  Gina Ashley in for issues related to her CHF  No pain with regards to her left leg at this point but very limited activity  Right leg DISTAL FEMUR fracture, s/p ORIF, doing well without pain  Xrays in hospital of left tibia reveal no obvious healing of proximal tibial fracture  PLAN: No need to follow up next week in our office, instead can see her back in 5 weeks for x-rays unless re hospitalized  Home PT for pivot transfers on right lower extremity NON weight bearing left lower extremity  Upper body strengthening asa tolerated for transfer purposes  Family can call for assistance or questions, [phone].

## 2013-01-22 NOTE — Discharge Summary (Addendum)
Physician Discharge Summary  Gina Ashley NFA:[id number] DOB: [birth year] DOA: 01/02/2013  PCP: Ernestine Conrad, MD  Admit date: 01/02/2013 Discharge date: 01/22/2013  Time spent: 50 minutes  Recommendations for Outpatient Follow-up:  1. Patient will be transferred to [facility] skilled nursing center for continued care, possibly tomorrow if cleared by cardiology 2. Needs outpatient palliative care services to follow patient. 3. Follow up with Dr. Charlann Boxer in 5 weeks with xrays of left knee 4. Non weight bearing on left knee, WBAT on right knee 5. Follow up with cardiology as outpatient.  Discharge Diagnoses:  Principal Problem:   Shortness of breath Active Problems:   DM   OBSTRUCTIVE SLEEP APNEA   HYPERTENSION   GERD   Persistent Atrial flutter   COPD (chronic obstructive pulmonary disease)   Anemia due to chronic illness   CKD (chronic kidney disease)   Chronic diastolic heart failure   Obesity (BMI 30-39.9)   Depression   Cardiorenal syndrome with renal failure   Hypokalemia   Acute on chronic diastolic heart failure   Enteritis due to Clostridium difficile   Weakness generalized   Adult failure to thrive   Discharge Condition: stable  Diet recommendation: low salt, fluid restrict to 1L/day  [facility] Weights   01/20/13 0326 01/21/13 0407 01/22/13 0451  Weight: 95.301 kg (210 lb 1.6 oz) 94.484 kg (208 lb 4.8 oz) 98.1 kg (216 lb 4.3 oz)    History of present illness:  Naydeen Speirs is a 79 y.o. female  has a past medical history of GERD (gastroesophageal reflux disease); Back pain, chronic; Pain in shoulder; Pain in ankle; Hiatal hernia; HTN (hypertension); CKD (chronic kidney disease), stage III; Atrial fibrillation; Sleep apnea; Fall (3 weeks ago); OSA on CPAP; Depression; PONV (postoperative nausea and vomiting); Chronic diastolic heart failure (08/31/2012); Tibia fracture; Renal failure; Fibula fracture; Respiratory failure; COPD (chronic obstructive pulmonary disease);  Metabolic encephalopathy; Renal failure; UTI (urinary tract infection); Pleural effusion; Diabetes mellitus; and CHF (congestive heart failure).  Presented with  79 yo W hx of complicated hospital stay due to numerous lower extremities' fractures. She has hx of CHF and CKD in the past requiring short term dialysis. IN the past she have responded well to high doses of lasix for diuresis. She also has hx of chronic respiratory failure due to obesity hypoventilation syndrome and COPD. She supposed to be on BIPAP at night and has chronic oxygen requirement of 2 L. She was discharged to [facility] And have been living there since than.  Family states that 1 month ago she had her perma-cath was discontinued. Family states that for the past 6 days she have had worsening edema. She was given lasix IV but were not successful to Diurese her. Her oxygen requirement stayed about the same but she was feeling more short of breath because of increasing abdominal girth. Patient states she wanted to have her Lasix increased butt it was not done instead she was sent to [facility] ER for further treatment. Patient states that the food that she is being fed is very salty and she attributes her fluid gain to this. According to the records it seems that at first her Lasix was attempted to be titrated up and then lunch and then she was switched to torsemide 20 mg a day and was given albumin IV twice a day albumin level was checked on February 24 and was 3.5.  Patient did not endorse any chest pains. Patient states that she was told that she'll likely will need dialysis  and that is the reason why she was transferred to [facility]. Patient is tearful and stating she does not wish to go on dialysis.  I spoke to nephrology regarding the patient quit this point recommends IV Lasix 120 mg IV every 6 hours. Given patient overall condition at this point she is a good dialysis candidate.   Hospital Course:  1. Acute right heart  congestive heart failure: Patient presented with worsening edema, increasing abdominal girth and SOB, despite iv Lasix. Clinically, she was found to be anasarcic and felt to have acute on chronic right sided HF and marked volume overload on exam. Dr Marca Ancona provided cardiology consultation, reviewed 2D echocardiogram, which showed poorly visualized moderately dilated RV with moderately decreased systolic function and mildly left-shifted IV septum. Patient was initially managed with iv Milrinone, as well as aggressive diuresis. Dr Reuel Boom Bensimhon/Heart Failure team followed subsequently, and were extremely helpful in rationalizing heart failure treatment. Clinically, patient has responded satisfactorily to treatment, and as of 01/15/13, was feeling considerably better. Diuretics were discontinued on 01/14/13, due to hypotension, and patient administered a bolus of NS. BP is satisfactory at this time. Diuretics were placed on hold till 01/17/13, but as creatinine has failed to stabilize, and weight is going up, Dr Graciela Husbands on 01/17/13 restarted Milrinone/Dopamine. Dr Delano Metz was re-consulted on 01/17/13, for possible HD, but has opined that patient is not a suitable candidate, due to her severe co-morbidities. Dr Marca Ancona had planned TEE/DCCV on 01/19/13, but TEE revealed thick smoke in LA and ? Small thrombus. He has opined that the safest plan at this point, will be 4 weeks therapeutic INR, continue loading Amiodarone, and re-attempt cardioversion in 4 wks. Managing as recommended. Dopamine was discontinued again on 01/19/13. She has been restarted on torsemide and her creatinine has mildly trended up again.  Her urine output is fair, but it is possible that she will not be able to tolerate any diuretics.  Will plan on continuing current dose of torsemide and repeat bmet in 2-3 days at nursing home. If cardiology clears the patient for discharge, we will plan on discharge in the morning. 2. Acute on  chronic kidney disease stage 3/cardio-renal syndrome: Patient's baseline creatinine was 1.14 on 09/15/12. Creatinine was 1.65 at presentation, and gradually climbed, reaching a high of 2.20 on 01/14/13. GFR now in CKD stage 4 range. In the initial few days of hospitalization, she was managed with iv low dose Dopamine. This was discontinued on 01/14/13. Following renal indices. Nephrology consultation was provided by Dr Delano Metz, and he was again re-consulted on 01/17/13. Patient is considered a poor candidate for RRT, due to her co-morbidities. The discussion in #1 above is pertinent. Creatinine trending up. Restarted on torsemide.  Will need to follow creatinine.  3. PAF (paroxysmal atrial fibrillation), was in SR 06/2012: Patient has a known history of chronic atrial fibrillation/flutter and has failed DCCV in the past. Managed with Coreg and Amiodarone, with satisfactory rate control. On Coumadin per pharmacy, and INR remains therapeutic.  4. Hypokalemia: Likely due to diuretics. Repleted as indicated. Following Lytes.  5. DM with complications (CKD), controlled: Hemoglobin A1c on this admission 6.8. Managing with diet, SSI and Lantus, with reasonable control.  6. HTN: Patient has a known history of HTN, but during this hospitalization, BP has been borderline to low. On Bidil and Coreg (dose lowered this admit, secondary to hypotension), with holding parameters orders. Patient appears to be tolerating borderline BP well.  7. Depression: Stable  on Buspirone.  8. Anemia: Patient has a mild normocytic anemia, due to chronic illness. HB has remained stable/reasonable.  9. OSA: Known history of OSA/OHS: Managing with nocturnal CPAP, and appear stable from this view point as of 01/15/13, although as discussed in #1, she presented with right heart failure.  10. Physical deconditioning: Patient is significantly deconditioned, due to multiple co-morbidites, multiple hospitalizations over past several months and  recent acute illness. PT/OT following. Plan discharge to [facility], when stable.  11. C. Difficile infection: Patient had a brief episode of diarrhea on 01/05/13. C.Difficile PCR is positive. She has been commenced on oral vancomycin until 3/30. 12. UTI: U/A of 01/16/13, demonstrated significant pyuria and bacteriuria. Culture is positive for GNR. On 3-day course of Ciprofloxacin (avoiding long course, due to C. Difficile infection), concluded on 01/18/13. Would not treat with further antibiotics since klebsiella in urine is likely a colonization.  13: Right ear discomfort: Patient complained of discomfort in her right ear on 01/18/13. Otoscopic examination revealed small amount of impacted cerumen. Debrox ordered.  14. Discussion. With patient's cardiorenal syndrome and multiple co morbidities, it is proving challenging to adequately diurese her, without significantly impairing her renal function. She is not a candidate for hemodialysis. She has had multiple admissions over the last 6 months for volume related issues. It appears that patient may not be able to maintain her volume status outside the hospital. Cardiology has recommended palliative care consult to further discuss goals of care, and I think this is reasonable. Goals of care meeting was conducted and patient has decided to continue with current treatments for now.  She does not wish to be aggressively rescusitated in the event of a cardiac arrest.  It is strongly recommended that palliative care services follow the patient in the outpatient setting.   Procedures:  TEE: Left ventricle: Systolic function was normal. The estimated ejection fraction was in the range of 55% to 60%. Wall motion was normal; there were no regional wall motion abnormalities. - Aortic valve: Valve mobility was restricted. - Mitral valve: Mildly calcified annulus. Mild regurgitation. - Left atrium: The atrium was moderately to severely dilated. There was a  possible thrombus in the tip of the appendage. There was moderatecontinuous spontaneous echo contrast ("smoke") in the appendage. Emptying velocity was reduced. - Right ventricle: The cavity size was moderately dilated. Systolic function was moderately reduced. - Right atrium: The atrium was moderately to severely dilated. - Tricuspid valve: Moderate regurgitation.    Consultations: Dr Arvilla Meres, cardiologist.  Dr dalton Shirlee Latch, cardiologist.  Dr Johny Sax, ID.  Dr Delano Metz, nephrologist.    Discharge Exam: Filed Vitals:   01/21/13 2101 01/22/13 0433 01/22/13 0451 01/22/13 1410  BP: 123/79 117/71  119/76  Pulse: 73 90  92  Temp: 97.4 F (36.3 C) 98.2 F (36.8 C)  98 F (36.7 C)  TempSrc: Oral Oral  Oral  Resp: 18 18  18   Height:      Weight:   98.1 kg (216 lb 4.3 oz)   SpO2: 98% 96%  96%    General: No distress Cardiovascular: S1, s2, rrr Respiratory: crackles at bases Extremities: 2+ edema b/l  Discharge Instructions     Medication List    STOP taking these medications       hydrALAZINE 25 MG tablet  Commonly known as:  APRESOLINE     isosorbide dinitrate 20 MG tablet  Commonly known as:  ISORDIL     spironolactone 25 MG tablet  Commonly  known as:  ALDACTONE     vitamin C 500 MG tablet  Commonly known as:  ASCORBIC ACID     Vitamin D (Ergocalciferol) 50000 UNITS Caps  Commonly known as:  DRISDOL     vitamin k 100 MCG tablet      TAKE these medications       allopurinol 100 MG tablet  Commonly known as:  ZYLOPRIM  Take 100 mg by mouth daily.     amiodarone 200 MG tablet  Commonly known as:  PACERONE  Take 2 tablets (400 mg total) by mouth daily.     busPIRone 5 MG tablet  Commonly known as:  BUSPAR  Take 5 mg by mouth every 12 (twelve) hours.     carvedilol 25 MG tablet  Commonly known as:  COREG  Take 25 mg by mouth 2 (two) times daily with a meal.     diphenhydrAMINE 25 mg capsule  Commonly known as:  BENADRYL   Take 25 mg by mouth every 8 (eight) hours as needed for itching.     docusate sodium 100 MG capsule  Commonly known as:  COLACE  Take 100 mg by mouth every 8 (eight) hours.     guaiFENesin 600 MG 12 hr tablet  Commonly known as:  MUCINEX  Take 600 mg by mouth 2 (two) times daily.     HYDROcodone-acetaminophen 5-325 MG per tablet  Commonly known as:  NORCO/VICODIN  Take 1 tablet by mouth every 4 (four) hours as needed for pain.     insulin aspart 100 UNIT/ML injection  Commonly known as:  novoLOG  Inject 1-5 Units into the skin 3 (three) times daily with meals. 150-200=1unit,201-250=2units,251-300=3units,301-350=4units,351-400=5units if over 400 call MD     insulin glargine 100 UNIT/ML injection  Commonly known as:  LANTUS  Inject 5 Units into the skin at bedtime.     isosorbide-hydrALAZINE 20-37.5 MG per tablet  Commonly known as:  BIDIL  Take 1 tablet by mouth 2 (two) times daily.     Menthol-Zinc Oxide 0.4-20.5 % Pste  Apply 1 application topically daily.     multivitamin with minerals Tabs  Take 1 tablet by mouth daily.     nitroGLYCERIN 0.4 MG SL tablet  Commonly known as:  NITROSTAT  Place 0.4 mg under the tongue every 5 (five) minutes as needed. Chest pain     ondansetron 4 MG tablet  Commonly known as:  ZOFRAN  Take 4 mg by mouth every 6 (six) hours as needed for nausea.     oxyCODONE 10 MG 12 hr tablet  Commonly known as:  OXYCONTIN  Take 10 mg by mouth every 12 (twelve) hours.     oxyCODONE 5 MG immediate release tablet  Commonly known as:  Oxy IR/ROXICODONE  Take 5 mg by mouth every 4 (four) hours as needed for pain.     pantoprazole 40 MG tablet  Commonly known as:  PROTONIX  Take 40 mg by mouth daily.     polyethylene glycol packet  Commonly known as:  MIRALAX / GLYCOLAX  Take 17 g by mouth 2 (two) times daily.     simethicone 80 MG chewable tablet  Commonly known as:  MYLICON  Chew 80 mg by mouth every 6 (six) hours as needed for flatulence.      torsemide 20 MG tablet  Commonly known as:  DEMADEX  Take 3 tablets (60 mg total) by mouth daily.     traZODone 50 MG tablet  Commonly known as:  DESYREL  Take 50 mg by mouth at bedtime.     vancomycin 50 mg/mL oral solution  Commonly known as:  VANCOCIN  Take 2.5 mLs (125 mg total) by mouth every 6 (six) hours. Until 3/30     warfarin 3 MG tablet  Commonly known as:  COUMADIN  Take 3 mg by mouth daily.           Follow-up Information   Follow up with Shelda Pal, MD In 5 weeks.   Contact information:   [address] [place] [address] [phone]        The results of significant diagnostics from this hospitalization (including imaging, microbiology, ancillary and laboratory) are listed below for reference.    Significant Diagnostic Studies: Dg Chest 2 View  01/02/2013  *RADIOLOGY REPORT*  Clinical Data: Shortness of breath  CHEST - 2 VIEW  Comparison: 11/24/2012  Findings: Cardiomegaly with mild interstitial edema, stable. Moderate right pleural effusion, increased.  Small left pleural effusion, unchanged.  No pneumothorax.  Interval removal of right PICC and left chest dual lumen catheter.  Degenerative changes of the visualized thoracolumbar spine.  IMPRESSION: Cardiomegaly with mild interstitial edema and small left pleural effusion, unchanged.  Moderate right pleural effusion, increased.   Original Report Authenticated By: Charline Bills, M.D.    Dg Knee 1-2 Views Left  01/20/2013  *RADIOLOGY REPORT*  Clinical Data: Left knee pain and swelling.  History of old trauma.  LEFT KNEE - 1-2 VIEW  Comparison: CT 12/01/2012.  Fluoroscopy 11/30/2012.  Left tibia and fibula 11/21/2012.  Left tibia and fibula 08/27/2012.  Findings: There are postoperative changes with left total knee arthroplasty and patellofemoral component.  There are pin tracks in the distal femoral and proximal tibial shaft.  There is a displaced fracture of the  proximal left tibial metaphysis which has been present previously and is now demonstrating a healing with sclerosis in the fracture line and loss of distinction of the fracture line.  There is residual lateral displacement angulation of the distal fracture fragments.  Alignment and position is similar to previous studies.  There is no significant effusion.  No acute fractures are demonstrated.  IMPRESSION: Old postoperative and post-traumatic changes in the left knee as described.  There is an old healing fracture of the tibial metaphysis with line in position similar to previous study.  Left knee arthroplasty.  Pin tracks in the tibia and fibula.  No acute fractures are suggested.   Original Report Authenticated By: Burman Nieves, M.D.    Dg Chest Port 1 View  01/06/2013  *RADIOLOGY REPORT*  Clinical Data: If placement.  PORTABLE CHEST - 1 VIEW  Comparison: Chest radiograph performed 01/02/2013  Findings: The right PICC is noted ending about the distal SVC.  There is persistent bibasilar and right midlung zone airspace opacification, with underlying vascular congestion, most compatible with pulmonary edema.  Moderate right and small left pleural effusions are grossly unchanged in appearance.  No pneumothorax is seen.  The cardiomediastinal silhouette is enlarged.  No acute osseous abnormalities are identified.  IMPRESSION:  1.  Right PICC noted ending about the distal SVC. 2.  Relatively stable appearance to pulmonary edema, with moderate right and small left pleural effusions, as on the prior study. 3.  Underlying vascular congestion and cardiomegaly.   Original Report Authenticated By: Tonia Ghent, M.D.     Microbiology: Recent Results (from the past 240 hour(s))  URINE CULTURE     Status: None  Collection Time    01/16/13  2:30 AM      Result Value Range Status   Specimen Description URINE, CATHETERIZED   Final   Special Requests CX ADDED AT 0324 ON [id number]   Final   Culture  Setup Time  01/16/2013 11:18   Final   Colony Count >=100,000 COLONIES/ML   Final   Culture     Final   Value: KLEBSIELLA PNEUMONIAE     Note: COLISTINE 0.125 ug/ml ETEST results for this drug are "FOR INVESTIGATIONAL USE ONLY" and should NOT be used for clinical purposes. THIS ISOLATE PREVIOUSLY CONFIRMED AS [facility] CARBAPENEMASE PRODUCER CRITICAL RESULT CALLED TO, READ BACK BY AND VERIFIED      WITH: TIMED [email or web address]  ON [id number] BY [facility] CALLED INFECTION CONTROL PEGGY DANIELLA [phone] [id number] BY [facility]   Report Status 01/21/2013 FINAL   Final   Organism ID, Bacteria KLEBSIELLA PNEUMONIAE   Final  CLOSTRIDIUM DIFFICILE BY PCR     Status: Abnormal   Collection Time    01/16/13  3:35 AM      Result Value Range Status   C difficile by pcr POSITIVE (*) NEGATIVE Final   Comment: CRITICAL RESULT CALLED TO, READ BACK BY AND VERIFIED WITH:     HANEY R.,RN 01/16/13 0842 BY JONESJ     Labs: Basic Metabolic Panel:  Recent Labs Lab 01/18/13 0500 01/19/13 0500 01/20/13 0430 01/21/13 0600 01/22/13 0500  NA 136 138 139 137 138  K 3.5 3.4* 4.3 3.9 3.6  CL 87* 89* 92* 91* 91*  CO2 42* 41* 38* 38* 37*  GLUCOSE 116* 81 124* 104* 96  BUN 75* 77* 79* 81* 84*  CREATININE 2.62* 2.58* 2.74* 2.78* 2.88*  CALCIUM 10.0 10.5 9.6 9.7 9.9   Liver Function Tests: No results found for this basename: AST, ALT, ALKPHOS, BILITOT, PROT, ALBUMIN,  in the last 168 hours No results found for this basename: LIPASE, AMYLASE,  in the last 168 hours No results found for this basename: AMMONIA,  in the last 168 hours CBC:  Recent Labs Lab 01/18/13 0500 01/19/13 0500 01/20/13 0430 01/21/13 0600 01/22/13 0500  WBC 5.8 5.3 4.4 4.3 4.4  HGB 9.6* 9.9* 8.7* 9.3* 9.4*  HCT 30.2* 31.5* 27.9* 29.2* 29.3*  MCV 85.8 87.0 87.5 85.4 85.7  PLT 139* 159 130* 135* 126*   Cardiac Enzymes: No results found for this basename: CKTOTAL, CKMB, CKMBINDEX, TROPONINI,  in the last 168 hours BNP: BNP (last 3 results)  Recent Labs  01/03/13 0800   PROBNP 4706.0*   CBG:  Recent Labs Lab 01/21/13 1638 01/21/13 2057 01/22/13 0655 01/22/13 1103 01/22/13 1545  GLUCAP 157* 153* 96 127* 137*       Signed:  MEMON,JEHANZEB  [facility] 01/22/2013, 4:27 PM

## 2013-01-23 DIAGNOSIS — D638 Anemia in other chronic diseases classified elsewhere: Secondary | ICD-10-CM

## 2013-01-23 DIAGNOSIS — I5033 Acute on chronic diastolic (congestive) heart failure: Secondary | ICD-10-CM

## 2013-01-23 DIAGNOSIS — R609 Edema, unspecified: Secondary | ICD-10-CM

## 2013-01-23 LAB — GLUCOSE, CAPILLARY: Glucose-Capillary: 62 mg/dL — ABNORMAL LOW (ref 70–99)

## 2013-01-23 NOTE — Significant Event (Signed)
Report called to [facility] at [facility].  Pt transferred via ambulance with belongings  VSS no C/O noted.

## 2013-01-23 NOTE — Clinical Social Work Note (Signed)
Patient to transfer today to [facility] via [facility]. Patient's daughter and facility aware of transfer. Discharge packet complete. CSW signing off.  Ricke Hey, [place] [phone] (weekend)

## 2013-01-23 NOTE — Progress Notes (Addendum)
Subjective: Patient seen, no new complaints. Denies shortness of breath. Filed Vitals:   01/23/13 0423  BP: 127/69  Pulse: 90  Temp: 98.2 F (36.8 C)  Resp: 18    Chest: Clear Bilaterally Heart : S1S2 RRR Abdomen: Soft, nontender Ext : 1+ edema in lower extremities Neuro: Alert, oriented x 3  A/P  Patient Active Problem List  Diagnosis  . DM  . OBSTRUCTIVE SLEEP APNEA  . HYPERTENSION  . GERD  . Persistent Atrial flutter  . COPD (chronic obstructive pulmonary disease)  . Anemia due to chronic illness  . CKD (chronic kidney disease)  . Chronic diastolic heart failure  . Obesity (BMI 30-39.9)  . Depression  . Shortness of breath  . Cardiorenal syndrome with renal failure  . Hypokalemia  . Acute on chronic diastolic heart failure  . Enteritis due to Clostridium difficile  . Weakness generalized  . Adult failure to thrive   Patient is stable for discharge See details in discharge summary done by Dr Kerry Hough  Patient had picc line placed during this admission for IV access, no need of picc line at this time.will d/c the Picc line. Meredeth Ide [facility]/ Pager- [phone]

## 2013-01-23 NOTE — Progress Notes (Signed)
ANTICOAGULATION CONSULT NOTE - Follow Up Consult  Pharmacy Consult for Warfarin Indication: atrial fibrillation   Recent Labs  01/21/13 0600 01/22/13 0500 01/23/13 0535  HGB 9.3* 9.4*  --   HCT 29.2* 29.3*  --   PLT 135* 126*  --   LABPROT 26.3* 27.7* 28.0*  INR 2.56* 2.75* 2.79*  CREATININE 2.78* 2.88*  --    Estimated Creatinine Clearance: 16.4 ml/min (by C-G formula based on Cr of 2.88).  Assessment:  79yo female admitted 01/02/2013  And continues on chronic coumadin for atrial fibrillation.  INR continues to be therapeutic at 2.79 today on home dose of 3mg  daily. Patient completed 6 dose course of ciprofloxacin 3/18, amiodarone dose changed to 400mg  daily 3/19, and was just on 5 days of metronidazole (changed to PO vancomycin 3/20). CBC stable. No bleeding reported.  Goal: INR 2-3  Plan:  1) Continue coumadin 3mg  daily 2) Change INR to MWF if not discharge to SNF today   Thank you for allowing pharmacy to be a part of this patients care team.  Lovenia Kim Pharm.D., BCPS Clinical Pharmacist 01/23/2013 7:58 AM Pager: [phone] Phone: [phone]

## 2013-01-25 ENCOUNTER — Other Ambulatory Visit: Payer: Self-pay | Admitting: *Deleted

## 2013-01-25 ENCOUNTER — Other Ambulatory Visit: Payer: Self-pay | Admitting: Geriatric Medicine

## 2013-01-25 MED ORDER — OXYCODONE HCL 10 MG PO TB12: 10.0000 mg | ORAL_TABLET | Freq: Two times a day (BID) | ORAL | Status: DC

## 2013-01-25 MED ORDER — OXYCODONE HCL 5 MG PO TABS: ORAL_TABLET | ORAL | Status: DC

## 2013-01-25 MED ORDER — HYDROCODONE-ACETAMINOPHEN 5-325 MG PO TABS: 1.0000 | ORAL_TABLET | ORAL | Status: DC | PRN

## 2013-01-27 ENCOUNTER — Encounter (HOSPITAL_COMMUNITY): Payer: Self-pay

## 2013-01-27 ENCOUNTER — Ambulatory Visit (HOSPITAL_COMMUNITY)
Admission: RE | Admit: 2013-01-27 | Discharge: 2013-01-27 | Disposition: A | Discharge status: Home / Self Care | Payer: Medicare Other | Source: Ambulatory Visit | Attending: Internal Medicine | Admitting: Internal Medicine

## 2013-01-27 VITALS — BP 120/84 | HR 100

## 2013-01-27 DIAGNOSIS — I480 Paroxysmal atrial fibrillation: Secondary | ICD-10-CM

## 2013-01-27 DIAGNOSIS — I129 Hypertensive chronic kidney disease with stage 1 through stage 4 chronic kidney disease, or unspecified chronic kidney disease: Secondary | ICD-10-CM | POA: Diagnosis not present

## 2013-01-27 DIAGNOSIS — I1310 Hypertensive heart and chronic kidney disease without heart failure, with stage 1 through stage 4 chronic kidney disease, or unspecified chronic kidney disease: Secondary | ICD-10-CM

## 2013-01-27 DIAGNOSIS — N189 Chronic kidney disease, unspecified: Secondary | ICD-10-CM | POA: Diagnosis not present

## 2013-01-27 DIAGNOSIS — N19 Unspecified kidney failure: Secondary | ICD-10-CM

## 2013-01-27 DIAGNOSIS — I10 Essential (primary) hypertension: Secondary | ICD-10-CM

## 2013-01-27 DIAGNOSIS — I4891 Unspecified atrial fibrillation: Secondary | ICD-10-CM

## 2013-01-27 DIAGNOSIS — I5032 Chronic diastolic (congestive) heart failure: Secondary | ICD-10-CM | POA: Diagnosis present

## 2013-01-27 LAB — BASIC METABOLIC PANEL
CO2: 38 mEq/L — ABNORMAL HIGH (ref 19–32)
Calcium: 10 mg/dL (ref 8.4–10.5)
Creatinine, Ser: 1.9 mg/dL — ABNORMAL HIGH (ref 0.50–1.10)
GFR calc Af Amer: 27 mL/min — ABNORMAL LOW (ref >90)
Sodium: 138 mEq/L (ref 135–145)

## 2013-01-27 NOTE — Assessment & Plan Note (Signed)
Check BMET today.  Baseline Cr 1.8-2.0.  Continue current diuretics.  No ACE-I with renal failure.

## 2013-01-27 NOTE — Progress Notes (Signed)
HPI: [facility] is an 79 y.o. female with history of chronic atrial fibrillation on coumadin, diastolic heart failure and HTN. She also has DM type 2, COPD, OSA on CPAP and renal failure with baseline Cr 1.5-1.7. She has had 4 hospitalizations in the last 6 months, 1 included tibia/fibula fracture and the rest have been due to massive fluid overload in the setting of diastolic heart failure. She has required short term dialysis in the past for renal failure.   She was admitted for massive volume overload.  Echo showed RV dysfunction and she began to have sluggish milrinone 0.25 mcg/kg/min.  Urine output remained low therefore low dose dopamine started 3/7.  Milrinone was weaned to off on 3/18 and dopamine to off 3/19.  Cr improved initially but by discharge had increased to 2.78 (baseline 1.8-2.0).  Transitioned to torsemide 60 mg daily.  Discharge weight 212 pounds.  Permanent atrial fib and went for TEE/DCCV on 3/18 but unable to complete due to smoke.  Continued on amio for rate control and coumadin and repeat in 4 weeks.    She returns for post hospital follow up today.  She feels really good.  She is currently at [facility] receiving rehab.  She remains non-weight bearing on her left leg therefore could not get a weight.  She denies orthopnea, PND or dyspnea.  She wears continuous O2 at 2L.   Lower extremity edema improving.  Feels like rehab is going well.     ROS: All systems negative except as listed in HPI, PMH and Problem List.  Past Medical History  Diagnosis Date  . GERD (gastroesophageal reflux disease)   . Back pain, chronic   . Pain in shoulder   . Pain in ankle   . Hiatal hernia   . HTN (hypertension)     all her life  . CKD (chronic kidney disease), stage III     GFR: 38  . Atrial fibrillation     since 1996  . Sleep apnea   . Fall 3 weeks ago    was evaluated at [facility]  . OSA on CPAP   . Depression   . PONV (postoperative nausea and vomiting)     difficult to  wake up  . Chronic diastolic heart failure 08/31/2012  . Tibia fracture     BILATERAL  . Renal failure     acute on chronic   . Fibula fracture     bilateral  . Respiratory failure     acute on chronic hx of  requiring BIPAP  . COPD (chronic obstructive pulmonary disease)   . Metabolic encephalopathy     hx of   . Renal failure     acute on chronic with need for intermittent dialysis - hx of   . UTI (urinary tract infection)     hx of   . Pleural effusion     right sided now resolved   . Diabetes mellitus     x 15 yrs, hx of hypoglycemia   . CHF (congestive heart failure)     diastolic HF    Current Outpatient Prescriptions  Medication Sig Dispense Refill  . allopurinol (ZYLOPRIM) 100 MG tablet Take 100 mg by mouth daily.      Marland Kitchen amiodarone (PACERONE) 200 MG tablet Take 2 tablets (400 mg total) by mouth daily.      . busPIRone (BUSPAR) 5 MG tablet Take 5 mg by mouth every 12 (twelve) hours.      Marland Kitchen  carvedilol (COREG) 25 MG tablet Take 25 mg by mouth 2 (two) times daily with a meal.      . diphenhydrAMINE (BENADRYL) 25 mg capsule Take 25 mg by mouth every 8 (eight) hours as needed for itching.      . docusate sodium (COLACE) 100 MG capsule Take 100 mg by mouth every 8 (eight) hours.       Marland Kitchen guaiFENesin (MUCINEX) 600 MG 12 hr tablet Take 600 mg by mouth 2 (two) times daily.       Marland Kitchen HYDROcodone-acetaminophen (NORCO/VICODIN) 5-325 MG per tablet Take 1 tablet by mouth every 4 (four) hours as needed for pain.  180 tablet  0  . insulin aspart (NOVOLOG) 100 UNIT/ML injection Inject 1-5 Units into the skin 3 (three) times daily with meals. 150-200=1unit,201-250=2units,251-300=3units,301-350=4units,351-400=5units if over 400 call MD      . insulin glargine (LANTUS) 100 UNIT/ML injection Inject 5 Units into the skin at bedtime.      . isosorbide-hydrALAZINE (BIDIL) 20-37.5 MG per tablet Take 1 tablet by mouth 2 (two) times daily.      . Menthol-Zinc Oxide 0.4-20.5 % PSTE Apply 1 application  topically daily.      . Multiple Vitamin (MULTIVITAMIN WITH MINERALS) TABS Take 1 tablet by mouth daily.      . nitroGLYCERIN (NITROSTAT) 0.4 MG SL tablet Place 0.4 mg under the tongue every 5 (five) minutes as needed. Chest pain      . omeprazole (PRILOSEC) 20 MG capsule Take 40 mg by mouth daily.      . ondansetron (ZOFRAN) 4 MG tablet Take 4 mg by mouth every 6 (six) hours as needed for nausea.      Marland Kitchen oxyCODONE (OXY IR/ROXICODONE) 5 MG immediate release tablet Take one tablet by mouth every four hours as needed for pain  180 tablet  0  . oxyCODONE (OXYCONTIN) 10 MG 12 hr tablet Take 1 tablet (10 mg total) by mouth every 12 (twelve) hours.  60 tablet  0  . polyethylene glycol (MIRALAX / GLYCOLAX) packet Take 17 g by mouth 2 (two) times daily.       . simethicone (MYLICON) 80 MG chewable tablet Chew 80 mg by mouth every 6 (six) hours as needed for flatulence.      . torsemide (DEMADEX) 20 MG tablet Take 3 tablets (60 mg total) by mouth daily.      . traZODone (DESYREL) 50 MG tablet Take 50 mg by mouth at bedtime.      . vancomycin (VANCOCIN) 50 mg/mL oral solution Take 2.5 mLs (125 mg total) by mouth every 6 (six) hours. Until 3/30      . warfarin (COUMADIN) 3 MG tablet Take 3 mg by mouth daily.       No current facility-administered medications for this encounter.     PHYSICAL EXAM: Filed Vitals:   01/27/13 1347  BP: 120/84  Pulse: 100  SpO2: 100%    General:  Chronically ill appearing. No resp difficulty on 2L O2 per n/c HEENT: normal Neck: supple. JVP 7-8. Carotids 2+ bilaterally; no bruits. No lymphadenopathy or thryomegaly appreciated. Cor: Distant, irregularly irregular.  No rubs, gallops or murmurs. Lungs: clear but distant Abdomen: obese, soft, nontender, nondistended. No hepatosplenomegaly. No bruits or masses. Good bowel sounds. Extremities: no cyanosis, clubbing, rash, tr edema Rt lower extremity, Lt lower extremity in full brace Neuro: alert & orientedx3, cranial nerves  grossly intact. Moves all 4 extremities w/o difficulty. Affect pleasant.   ASSESSMENT & PLAN:

## 2013-01-27 NOTE — Assessment & Plan Note (Addendum)
Continue amiodarone for rate control.  Can repeat DCCV in 3 weeks if INR remains above 2 due to smoke found on TEE on 3/18.  Continue coumadin.

## 2013-01-27 NOTE — Assessment & Plan Note (Signed)
Fluid status has remained stable on torsemide 60 mg daily.  Will continue current dose.  Will have [facility] begin to weigh the patient daily.  Have discussed low sodium diet and fluid restrictions in full.

## 2013-01-27 NOTE — Assessment & Plan Note (Signed)
Currently controlled, will continue to monitor.

## 2013-01-29 DIAGNOSIS — R531 Weakness: Secondary | ICD-10-CM

## 2013-01-29 NOTE — Progress Notes (Signed)
Date: 01/29/2013  MRN:  [id number] Name:  [facility] Sex:  female Age:  79 y.o. DOB:1927-10-16   [facility] #:    [id number]                   Facility/Room;[facility] Level Of Care: SNF Provider: Dr. Murray Hodgkins   Emergency Contacts: Contact Information   Name Relation Home Work Mobile   [facility] Daughter [phone]  [phone]      Code Status:DNR MOST Form:  Allergies: Allergies  Allergen Reactions  . Morphine Sulfate Other (See Comments)    REACTION: change in personality  . Remeron (Mirtazapine) Other (See Comments)    Altered mental status, lethargy  . Tuna (Fish Allergy) Other (See Comments)    unknown  . Penicillins Rash    Tolerates Ancef.     No chief complaint on file.    [facility]:UVOZ Pore is a 79 y.o. female   has a past medical history of GERD (gastroesophageal reflux disease); Back pain, chronic; Pain in shoulder; Pain in ankle; Hiatal hernia; HTN (hypertension); CKD (chronic kidney disease), stage III; Atrial fibrillation; Sleep apnea; Fall (3 weeks ago); OSA on CPAP; Depression; PONV (postoperative nausea and vomiting); Chronic diastolic heart failure (08/31/2012); Tibia fracture; Renal failure; Fibula fracture; Respiratory failure; COPD (chronic obstructive pulmonary disease); Metabolic encephalopathy; Renal failure; UTI (urinary tract infection); Pleural effusion; Diabetes mellitus; and CHF (congestive heart failure).   Presented with   79 yo W hx of complicated [facility] stay due to numerous lower extremities' fractures. She has hx of CHF and CKD in the past requiring short term dialysis. IN the past she have responded well to high doses of lasix for diuresis. She also has hx of chronic respiratory failure due to obesity hypoventilation syndrome and COPD. She supposed to be on BIPAP at night and has chronic oxygen requirement of 2 L. She was [facility] to [facility] have been living there since than.   Family states that 1 month ago she  had her perma-cath was discontinued. Family states that for the past 6 days she have had worsening edema. She was given lasix IV but were not successful to Diurese her. Her oxygen requirement stayed about the same but she was feeling more short of breath because of increasing abdominal girth. Patient states she wanted to have her Lasix increased butt it was not done instead she was sent to [facility] for further treatment. Patient states that the food that she is being fed is very salty and she attributes her fluid gain to this. According to the records it seems that at first her Lasix was attempted to be titrated up and then lunch and then she was switched to torsemide 20 mg a day and was given albumin IV twice a day albumin level was checked on February 24 and was 3.5.   Patient did not endorse any chest pains. Patient states that she was told that she'll likely will need dialysis and that is the reason why she was transferred to [facility]. Patient is tearful and stating she does not wish to go on dialysis.        Past Medical History  Diagnosis Date  . GERD (gastroesophageal reflux disease)   . Back pain, chronic   . Pain in shoulder   . Pain in ankle   . Hiatal hernia   . HTN (hypertension)     all her life  . CKD (chronic kidney disease), stage III     GFR:  38  . Atrial fibrillation     since 1996  . Sleep apnea   . Fall 3 weeks ago    was evaluated at [facility]  . OSA on CPAP   . Depression   . PONV (postoperative nausea and vomiting)     difficult to wake up  . Chronic diastolic heart failure 08/31/2012  . Tibia fracture     BILATERAL  . Renal failure     Gina on chronic   . Fibula fracture     bilateral  . Respiratory failure     Gina on chronic hx of  requiring BIPAP  . COPD (chronic obstructive pulmonary disease)   . Metabolic encephalopathy     hx of   . Renal failure     Gina on chronic with need for intermittent dialysis - hx of   . UTI (urinary tract  infection)     hx of   . Pleural effusion     right sided now resolved   . Diabetes mellitus     x 15 yrs, hx of hypoglycemia   . CHF (congestive heart failure)     diastolic HF   Shortness of breath  Atrial Flutter  Obesity  Anemia Hypokalemia Past Surgical History  Procedure Laterality Date  . Bunionectomy      bilateral  . Cholecystectomy    . Rotator cuff repair      2002  . Total knee arthroplasty      bilateral 2001  . Hammer toe surgery      2004  . Colonoscopy  07/11/2011    Procedure: COLONOSCOPY;  Surgeon: Malissa Hippo, MD;  Location: [facility];  Service: Endoscopy;  Laterality: N/A;  3:00  . Orif periprosthetic fracture  09/08/2012    Procedure: OPEN REDUCTION INTERNAL FIXATION (ORIF) PERIPROSTHETIC FRACTURE;  Surgeon: Shelda Pal, MD;  Location: [facility];  Service: Orthopedics;  Laterality: Right;  ORIF periprosthetic right proximal femur fracture Spanning external fixator left distal tibia  . External fixation leg  09/08/2012    Procedure: EXTERNAL FIXATION LEG;  Surgeon: Shelda Pal, MD;  Location: [facility];  Service: Orthopedics;  Laterality: Left;  . Joint replacement      bilateral knees   . External fixation leg  11/30/2012    Procedure: EXTERNAL FIXATION LEG;  Surgeon: Shelda Pal, MD;  Location: [facility];  Service: Orthopedics;  Laterality: Left;  Removal of External Fixation Left Knee with Evaluation with Floroscopy  . Tee without cardioversion N/A 01/19/2013    Procedure: TRANSESOPHAGEAL ECHOCARDIOGRAM (TEE);  Surgeon: Lewayne Bunting, MD;  Location[facility];  Service: Cardiovascular;  Laterality: N/A;     Procedures:TEE: Left ventricle: Systolic function was normal. The estimated ejection fraction was in the range of 55% to 60%. Wall motion was normal; there were no regional wall motion abnormalities. - Aortic valve: Valve mobility was restricted. - Mitral valve: Mildly calcified annulus. Mild regurgitation. - Left atrium: The atrium was  moderately to severely dilated. There was a possible thrombus in the tip of the appendage. There was moderatecontinuous spontaneous echo contrast ("smoke") in the appendage. Emptying velocity was reduced. - Right ventricle: The cavity size was moderately dilated. Systolic function was moderately reduced. - Right atrium: The atrium was moderately to severely dilated. - Tricuspid valve: Moderate regurgitation.  Dg Chest 2 View  01/02/2013  *RADIOLOGY REPORT*  Clinical Data: Shortness of breath  CHEST - 2 VIEW  Comparison: 11/24/2012  Findings: Cardiomegaly with  mild interstitial edema, stable. Moderate right pleural effusion, increased.  Small left pleural effusion, unchanged. No pneumothorax.  Interval removal of right PICC and left chest dual lumen catheter.  Degenerative changes of the visualized thoracolumbar spine.  IMPRESSION: Cardiomegaly with mild interstitial edema and small left pleural effusion, unchanged.  Moderate right pleural effusion, increased.   Original Report Authenticated By: Charline Bills, M.D.    Dg Knee 1-2 Views Left  01/20/2013  *RADIOLOGY REPORT*  Clinical Data: Left knee pain and swelling.  History of old trauma.  LEFT KNEE - 1-2 VIEW  Comparison: CT 12/01/2012.  Fluoroscopy 11/30/2012.  Left tibia and fibula 11/21/2012.  Left tibia and fibula 08/27/2012.  Findings: There are postoperative changes with left total knee arthroplasty and patellofemoral component.  There are pin tracks in the distal femoral and proximal tibial shaft.  There is a displaced fracture of the proximal left tibial metaphysis which has been present previously and is now demonstrating a healing with sclerosis in the fracture line and loss of distinction of the fracture line.  There is residual lateral displacement angulation of the distal fracture fragments.  Alignment and position is similar to previous studies.  There is no significant effusion.  No Gina fractures are demonstrated.  IMPRESSION: Old  postoperative and post-traumatic changes in the left knee as described.  There is an old healing fracture of the tibial metaphysis with line in position similar to previous study.  Left knee arthroplasty.  Pin tracks in the tibia and fibula.  No Gina fractures are suggested.   Original Report Authenticated By: Burman Nieves, M.D.    Dg Chest Port 1 View  01/06/2013  *RADIOLOGY REPORT*  Clinical Data: If placement.  PORTABLE CHEST - 1 VIEW  Comparison: Chest radiograph performed 01/02/2013  Findings: The right PICC is noted ending about the distal SVC.  There is persistent bibasilar and right midlung zone airspace opacification, with underlying vascular congestion, most compatible with pulmonary edema.  Moderate right and small left pleural effusions are grossly unchanged in appearance.  No pneumothorax is seen.  The cardiomediastinal silhouette is enlarged.  No Gina osseous abnormalities are identified.  IMPRESSION:  1.  Right PICC noted ending about the distal SVC. 2.  Relatively stable appearance to pulmonary edema, with moderate right and small left pleural effusions, as on the prior study. 3.  Underlying vascular congestion and cardiomegaly.   Original Report Authenticated By: Tonia Ghent, M.D.      Consultants:Dr Arvilla Meres, cardiologist.    Dr dalton Shirlee Latch, cardiologist.    Dr Johny Sax, ID.    Dr Delano Metz, nephrologist.     Current Outpatient Prescriptions  Medication Sig Dispense Refill  . allopurinol (ZYLOPRIM) 100 MG tablet Take 100 mg by mouth daily.      Marland Kitchen amiodarone (PACERONE) 200 MG tablet Take 2 tablets (400 mg total) by mouth daily.      . busPIRone (BUSPAR) 5 MG tablet Take 5 mg by mouth every 12 (twelve) hours.      . carvedilol (COREG) 25 MG tablet Take 25 mg by mouth 2 (two) times daily with a meal.      . diphenhydrAMINE (BENADRYL) 25 mg capsule Take 25 mg by mouth every 8 (eight) hours as needed for itching.      . docusate sodium (COLACE) 100 MG  capsule Take 100 mg by mouth every 8 (eight) hours.       Marland Kitchen guaiFENesin (MUCINEX) 600 MG 12 hr tablet Take 600 mg by mouth 2 (  two) times daily.       Marland Kitchen HYDROcodone-acetaminophen (NORCO/VICODIN) 5-325 MG per tablet Take 1 tablet by mouth every 4 (four) hours as needed for pain.  180 tablet  0  . insulin aspart (NOVOLOG) 100 UNIT/ML injection Inject 1-5 Units into the skin 3 (three) times daily with meals. 150-200=1unit,201-250=2units,251-300=3units,301-350=4units,351-400=5units if over 400 call MD      . insulin glargine (LANTUS) 100 UNIT/ML injection Inject 5 Units into the skin at bedtime.      . isosorbide-hydrALAZINE (BIDIL) 20-37.5 MG per tablet Take 1 tablet by mouth 2 (two) times daily.      . Menthol-Zinc Oxide 0.4-20.5 % PSTE Apply 1 application topically daily.      . Multiple Vitamin (MULTIVITAMIN WITH MINERALS) TABS Take 1 tablet by mouth daily.      . nitroGLYCERIN (NITROSTAT) 0.4 MG SL tablet Place 0.4 mg under the tongue every 5 (five) minutes as needed. Chest pain      . omeprazole (PRILOSEC) 20 MG capsule Take 40 mg by mouth daily.      . ondansetron (ZOFRAN) 4 MG tablet Take 4 mg by mouth every 6 (six) hours as needed for nausea.      Marland Kitchen oxyCODONE (OXY IR/ROXICODONE) 5 MG immediate release tablet Take one tablet by mouth every four hours as needed for pain  180 tablet  0  . oxyCODONE (OXYCONTIN) 10 MG 12 hr tablet Take 1 tablet (10 mg total) by mouth every 12 (twelve) hours.  60 tablet  0  . polyethylene glycol (MIRALAX / GLYCOLAX) packet Take 17 g by mouth 2 (two) times daily.       . simethicone (MYLICON) 80 MG chewable tablet Chew 80 mg by mouth every 6 (six) hours as needed for flatulence.      . torsemide (DEMADEX) 20 MG tablet Take 3 tablets (60 mg total) by mouth daily.      . traZODone (DESYREL) 50 MG tablet Take 50 mg by mouth at bedtime.      . vancomycin (VANCOCIN) 50 mg/mL oral solution Take 2.5 mLs (125 mg total) by mouth every 6 (six) hours. Until 3/30      . warfarin  (COUMADIN) 3 MG tablet Take 3 mg by mouth daily.       No current facility-administered medications for this visit.    Immunization History  Administered Date(s) Administered  . Influenza Split 08/24/2012  . Influenza-Generic 10/04/2012  . Pneumococcal-Generic 11/04/2010    History  Substance Use Topics  . Smoking status: Never Smoker   . Smokeless tobacco: Never Used  . Alcohol Use: No    Family History  Problem Relation Age of Onset  . Colon cancer Brother        Vital signs: BP 130/76  Pulse 78  Temp(Src) 97.4 F (36.3 C)  Resp 18    Screening Score  MMS    PHQ2    PHQ9     Fall Risk    BIMS    Annual summary: Hospitalizations:  Problem List as of 01/29/2013     ICD-9-CM   Persistent Atrial flutter   Last Assessment & Plan   01/27/2013 [facility] Encounter Edited 01/27/2013  5:31 PM by Hadassah Pais, PA-C     Continue amiodarone for rate control.  Can repeat DCCV in 3 weeks if INR remains above 2 due to smoke found on TEE on 3/18.  Continue coumadin.    Chronic diastolic heart failure   Last Assessment & Plan   01/27/2013  [facility] En[facility] Written 01/27/2013  5:28 PM by Hadassah Pais, PA-C     Fluid status has remained stable on torsemide 60 mg daily.  Will continue current dose.  Will have [facility] begin to weigh the patient daily.  Have discussed low sodium diet and fluid restrictions in full.     DM   OBSTRUCTIVE SLEEP APNEA   HYPERTENSION   Last Assessment & Plan   01/27/2013 [facility] Encounter Written 01/27/2013  5:28 PM by Hadassah Pais, PA-C     Currently controlled, will continue to monitor.    GERD   COPD (chronic obstructive pulmonary disease)   Anemia due to chronic illness   CKD (chronic kidney disease)   Obesity (BMI 30-39.9)   Depression   Shortness of breath   Cardiorenal syndrome with renal failure   Last Assessment & Plan   01/27/2013 [facility] Encounter Written 01/27/2013  5:29 PM by Hadassah Pais, PA-C     Check BMET today.   Baseline Cr 1.8-2.0.  Continue current diuretics.  No ACE-I with renal failure.     Hypokalemia   Gina on chronic diastolic heart failure   Enteritis due to Clostridium difficile   Weakness generalized   Adult failure to thrive     Infection History:  Functional assessment: Areas of potential improvement: Rehabilitation Potential: Prognosis for survival: Plan:  This encounter was created in error - please disregard.

## 2013-02-08 ENCOUNTER — Encounter (HOSPITAL_COMMUNITY): Payer: Self-pay | Admitting: Emergency Medicine

## 2013-02-08 ENCOUNTER — Inpatient Hospital Stay (HOSPITAL_COMMUNITY)
Admission: EM | Admit: 2013-02-08 | Discharge: 2013-02-13 | DRG: 291 | Disposition: A | Discharge status: Skilled Nursing Facility | Payer: Medicare Other | Attending: Internal Medicine | Admitting: Internal Medicine

## 2013-02-08 ENCOUNTER — Emergency Department (HOSPITAL_COMMUNITY): Payer: Medicare Other

## 2013-02-08 DIAGNOSIS — Z66 Do not resuscitate: Secondary | ICD-10-CM | POA: Diagnosis present

## 2013-02-08 DIAGNOSIS — D638 Anemia in other chronic diseases classified elsewhere: Secondary | ICD-10-CM | POA: Diagnosis present

## 2013-02-08 DIAGNOSIS — Z7901 Long term (current) use of anticoagulants: Secondary | ICD-10-CM

## 2013-02-08 DIAGNOSIS — Z228 Carrier of other infectious diseases: Secondary | ICD-10-CM

## 2013-02-08 DIAGNOSIS — R5381 Other malaise: Secondary | ICD-10-CM | POA: Diagnosis present

## 2013-02-08 DIAGNOSIS — M109 Gout, unspecified: Secondary | ICD-10-CM | POA: Diagnosis present

## 2013-02-08 DIAGNOSIS — J4489 Other specified chronic obstructive pulmonary disease: Secondary | ICD-10-CM | POA: Diagnosis present

## 2013-02-08 DIAGNOSIS — E669 Obesity, unspecified: Secondary | ICD-10-CM | POA: Diagnosis present

## 2013-02-08 DIAGNOSIS — N184 Chronic kidney disease, stage 4 (severe): Secondary | ICD-10-CM | POA: Diagnosis present

## 2013-02-08 DIAGNOSIS — Z79899 Other long term (current) drug therapy: Secondary | ICD-10-CM

## 2013-02-08 DIAGNOSIS — R531 Weakness: Secondary | ICD-10-CM

## 2013-02-08 DIAGNOSIS — I4891 Unspecified atrial fibrillation: Secondary | ICD-10-CM | POA: Diagnosis present

## 2013-02-08 DIAGNOSIS — Z794 Long term (current) use of insulin: Secondary | ICD-10-CM

## 2013-02-08 DIAGNOSIS — K219 Gastro-esophageal reflux disease without esophagitis: Secondary | ICD-10-CM | POA: Diagnosis present

## 2013-02-08 DIAGNOSIS — N390 Urinary tract infection, site not specified: Secondary | ICD-10-CM | POA: Diagnosis present

## 2013-02-08 DIAGNOSIS — G4733 Obstructive sleep apnea (adult) (pediatric): Secondary | ICD-10-CM | POA: Diagnosis present

## 2013-02-08 DIAGNOSIS — I509 Heart failure, unspecified: Secondary | ICD-10-CM | POA: Diagnosis present

## 2013-02-08 DIAGNOSIS — F329 Major depressive disorder, single episode, unspecified: Secondary | ICD-10-CM | POA: Diagnosis present

## 2013-02-08 DIAGNOSIS — R8271 Bacteriuria: Secondary | ICD-10-CM

## 2013-02-08 DIAGNOSIS — Z880 Allergy status to penicillin: Secondary | ICD-10-CM

## 2013-02-08 DIAGNOSIS — F3289 Other specified depressive episodes: Secondary | ICD-10-CM | POA: Diagnosis present

## 2013-02-08 DIAGNOSIS — I5032 Chronic diastolic (congestive) heart failure: Secondary | ICD-10-CM

## 2013-02-08 DIAGNOSIS — E119 Type 2 diabetes mellitus without complications: Secondary | ICD-10-CM | POA: Diagnosis present

## 2013-02-08 DIAGNOSIS — R601 Generalized edema: Secondary | ICD-10-CM | POA: Diagnosis present

## 2013-02-08 DIAGNOSIS — Z8679 Personal history of other diseases of the circulatory system: Secondary | ICD-10-CM

## 2013-02-08 DIAGNOSIS — I5033 Acute on chronic diastolic (congestive) heart failure: Secondary | ICD-10-CM | POA: Diagnosis present

## 2013-02-08 DIAGNOSIS — I1310 Hypertensive heart and chronic kidney disease without heart failure, with stage 1 through stage 4 chronic kidney disease, or unspecified chronic kidney disease: Secondary | ICD-10-CM | POA: Diagnosis present

## 2013-02-08 DIAGNOSIS — D631 Anemia in chronic kidney disease: Secondary | ICD-10-CM | POA: Diagnosis present

## 2013-02-08 DIAGNOSIS — D696 Thrombocytopenia, unspecified: Secondary | ICD-10-CM | POA: Diagnosis present

## 2013-02-08 DIAGNOSIS — E662 Morbid (severe) obesity with alveolar hypoventilation: Secondary | ICD-10-CM | POA: Diagnosis present

## 2013-02-08 DIAGNOSIS — I130 Hypertensive heart and chronic kidney disease with heart failure and stage 1 through stage 4 chronic kidney disease, or unspecified chronic kidney disease: Principal | ICD-10-CM | POA: Diagnosis present

## 2013-02-08 DIAGNOSIS — Z888 Allergy status to other drugs, medicaments and biological substances status: Secondary | ICD-10-CM

## 2013-02-08 DIAGNOSIS — E873 Alkalosis: Secondary | ICD-10-CM | POA: Diagnosis present

## 2013-02-08 DIAGNOSIS — I4892 Unspecified atrial flutter: Secondary | ICD-10-CM | POA: Diagnosis present

## 2013-02-08 DIAGNOSIS — N179 Acute kidney failure, unspecified: Secondary | ICD-10-CM | POA: Diagnosis present

## 2013-02-08 DIAGNOSIS — J449 Chronic obstructive pulmonary disease, unspecified: Secondary | ICD-10-CM | POA: Diagnosis present

## 2013-02-08 DIAGNOSIS — N189 Chronic kidney disease, unspecified: Secondary | ICD-10-CM

## 2013-02-08 DIAGNOSIS — Z6837 Body mass index (BMI) 37.0-37.9, adult: Secondary | ICD-10-CM

## 2013-02-08 DIAGNOSIS — E875 Hyperkalemia: Secondary | ICD-10-CM

## 2013-02-08 DIAGNOSIS — I480 Paroxysmal atrial fibrillation: Secondary | ICD-10-CM | POA: Diagnosis present

## 2013-02-08 DIAGNOSIS — Z96659 Presence of unspecified artificial knee joint: Secondary | ICD-10-CM

## 2013-02-08 LAB — CBC WITH DIFFERENTIAL/PLATELET
Basophils Absolute: 0 10*3/uL (ref 0.0–0.1)
Lymphocytes Relative: 17 % (ref 12–46)
Lymphs Abs: 1 10*3/uL (ref 0.7–4.0)
MCV: 85 fL (ref 78.0–100.0)
Neutro Abs: 4.6 10*3/uL (ref 1.7–7.7)
Neutrophils Relative %: 77 % (ref 43–77)
Platelets: 129 10*3/uL — ABNORMAL LOW (ref 150–400)
RBC: 3.67 MIL/uL — ABNORMAL LOW (ref 3.87–5.11)
RDW: 16.5 % — ABNORMAL HIGH (ref 11.5–15.5)
WBC: 6 10*3/uL (ref 4.0–10.5)

## 2013-02-08 LAB — COMPREHENSIVE METABOLIC PANEL
ALT: 16 U/L (ref 0–35)
AST: 22 U/L (ref 0–37)
Alkaline Phosphatase: 150 U/L — ABNORMAL HIGH (ref 39–117)
CO2: 36 mEq/L — ABNORMAL HIGH (ref 19–32)
Calcium: 10 mg/dL (ref 8.4–10.5)
Chloride: 94 mEq/L — ABNORMAL LOW (ref 96–112)
GFR calc Af Amer: 17 mL/min — ABNORMAL LOW (ref >90)
GFR calc non Af Amer: 14 mL/min — ABNORMAL LOW (ref >90)
Glucose, Bld: 226 mg/dL — ABNORMAL HIGH (ref 70–99)
Potassium: 5.3 mEq/L — ABNORMAL HIGH (ref 3.5–5.1)
Sodium: 136 mEq/L (ref 135–145)

## 2013-02-08 LAB — URINALYSIS, ROUTINE W REFLEX MICROSCOPIC
Bilirubin Urine: NEGATIVE
Hgb urine dipstick: NEGATIVE
Ketones, ur: NEGATIVE mg/dL
Specific Gravity, Urine: 1.011 (ref 1.005–1.030)
Urobilinogen, UA: 0.2 mg/dL (ref 0.0–1.0)

## 2013-02-08 LAB — URINE MICROSCOPIC-ADD ON

## 2013-02-08 LAB — POTASSIUM: Potassium: 4.8 mEq/L (ref 3.5–5.1)

## 2013-02-08 NOTE — ED Notes (Signed)
Per ems- pt reports generalized weakness with some dizziness x1 week. Pt has apt here tomorrow afternoon. Vs stable. No pain, no fall. Pt a&ox4. Pt is from [place] [facility] facility.

## 2013-02-08 NOTE — ED Provider Notes (Signed)
History     CSN: [id number]  Arrival date & time 02/08/13  2055   First MD Initiated Contact with Patient 02/08/13 2116      Chief Complaint  Patient presents with  . Weakness    (Consider location/radiation/quality/duration/timing/severity/associated sxs/prior treatment) HPI Comments: Patient is an 79 year old female with an extensive past medical history who presents from [facility] skilled nursing for generalized weakness x3 days that is waxing and waning in severity, unchanged since onset. Patient states that symptoms primarily occur when going from a seated to standing position. Patient states rest alleviates her symptoms. She admits to associated dizziness and intermittent nausea. Patient denies fever, headache, visual disturbances, tinnitus, hearing loss, difficulty swallowing, chest pain, shortness of breath, vomiting or abdominal pain, numbness or tingling in her extremities, and syncope. Patient was recently hospitalized for 3 weeks for acute right CHF and acute on chronic kidney disease, stage III. Patient states that she is on Lasix which was increased on Saturday by [facility] skilled nursing facility, after which time symptoms began. Patient has chronic foley catheter and is currently in PT and nonweightbearing 2/2 a right femur fx and left tibial fracture post fall in 08/2012.  The history is provided by the patient. No language interpreter was used.    Past Medical History  Diagnosis Date  . GERD (gastroesophageal reflux disease)   . Back pain, chronic   . Pain in shoulder   . Pain in ankle   . Hiatal hernia   . HTN (hypertension)     all her life  . CKD (chronic kidney disease), stage III     GFR: 38  . Atrial fibrillation     since 1996  . Sleep apnea   . Fall 3 weeks ago    was evaluated at [facility]  . OSA on CPAP   . Depression   . PONV (postoperative nausea and vomiting)     difficult to wake up  . Chronic diastolic heart failure 08/31/2012  . Tibia  fracture     BILATERAL  . Renal failure     acute on chronic   . Fibula fracture     bilateral  . Respiratory failure     acute on chronic hx of  requiring BIPAP  . COPD (chronic obstructive pulmonary disease)   . Metabolic encephalopathy     hx of   . Renal failure     acute on chronic with need for intermittent dialysis - hx of   . UTI (urinary tract infection)     hx of   . Pleural effusion     right sided now resolved   . Diabetes mellitus     x 15 yrs, hx of hypoglycemia   . CHF (congestive heart failure)     diastolic HF    Past Surgical History  Procedure Laterality Date  . Bunionectomy      bilateral  . Cholecystectomy    . Rotator cuff repair      2002  . Total knee arthroplasty      bilateral 2001  . Hammer toe surgery      2004  . Colonoscopy  07/11/2011    Procedure: COLONOSCOPY;  Surgeon: Malissa Hippo, MD;  Location: [facility] ENDO SUITE;  Service: Endoscopy;  Laterality: N/A;  3:00  . Orif periprosthetic fracture  09/08/2012    Procedure: OPEN REDUCTION INTERNAL FIXATION (ORIF) PERIPROSTHETIC FRACTURE;  Surgeon: Shelda Pal, MD;  Location: [facility] ORS;  Service: Orthopedics;  Laterality: Right;  ORIF periprosthetic right proximal femur fracture Spanning external fixator left distal tibia  . External fixation leg  09/08/2012    Procedure: EXTERNAL FIXATION LEG;  Surgeon: Shelda Pal, MD;  Location: [facility] ORS;  Service: Orthopedics;  Laterality: Left;  . Joint replacement      bilateral knees   . External fixation leg  11/30/2012    Procedure: EXTERNAL FIXATION LEG;  Surgeon: Shelda Pal, MD;  Location: [facility] ORS;  Service: Orthopedics;  Laterality: Left;  Removal of External Fixation Left Knee with Evaluation with Floroscopy  . Tee without cardioversion N/A 01/19/2013    Procedure: TRANSESOPHAGEAL ECHOCARDIOGRAM (TEE);  Surgeon: Lewayne Bunting, MD;  Location: [facility] ENDOSCOPY;  Service: Cardiovascular;  Laterality: N/A;    Family History  Problem Relation Age of Onset   . Colon cancer Brother     History  Substance Use Topics  . Smoking status: Never Smoker   . Smokeless tobacco: Never Used  . Alcohol Use: No    OB History   Grav Para Term Preterm Abortions TAB SAB Ect Mult Living                  Review of Systems  Constitutional: Negative for fever and chills.  HENT: Negative for trouble swallowing, neck pain, neck stiffness and voice change.   Eyes: Negative for visual disturbance.  Respiratory: Negative for cough and shortness of breath.   Cardiovascular: Positive for leg swelling. Negative for chest pain and palpitations.  Gastrointestinal: Positive for nausea. Negative for vomiting, abdominal pain and diarrhea.  Genitourinary: Negative for hematuria.       Patient has foley catheter  Skin: Negative for color change and pallor.  Neurological: Positive for dizziness, weakness and light-headedness. Negative for syncope and numbness.  All other systems reviewed and are negative.    Allergies  Morphine sulfate; Remeron; Tuna; and Penicillins  Home Medications   Current Outpatient Rx  Name  Route  Sig  Dispense  Refill  . allopurinol (ZYLOPRIM) 100 MG tablet   Oral   Take 100 mg by mouth daily.         Marland Kitchen amiodarone (PACERONE) 200 MG tablet   Oral   Take 2 tablets (400 mg total) by mouth daily.         . busPIRone (BUSPAR) 5 MG tablet   Oral   Take 5 mg by mouth every 12 (twelve) hours.         . carvedilol (COREG) 25 MG tablet   Oral   Take 25 mg by mouth 2 (two) times daily with a meal.         . diphenhydrAMINE (BENADRYL) 25 mg capsule   Oral   Take 25 mg by mouth every 8 (eight) hours as needed for itching.         . docusate sodium (COLACE) 100 MG capsule   Oral   Take 100 mg by mouth every 8 (eight) hours.          Marland Kitchen guaiFENesin (MUCINEX) 600 MG 12 hr tablet   Oral   Take 600 mg by mouth 2 (two) times daily.          Marland Kitchen HYDROcodone-acetaminophen (NORCO/VICODIN) 5-325 MG per tablet   Oral   Take  1 tablet by mouth every 4 (four) hours as needed for pain.   180 tablet   0   . insulin aspart (NOVOLOG) 100 UNIT/ML injection   Subcutaneous   Inject 1-5 Units into the skin 3 (  three) times daily with meals. 150-200=1unit,201-250=2units,251-300=3units,301-350=4units,351-400=5units if over 400 call MD         . isosorbide-hydrALAZINE (BIDIL) 20-37.5 MG per tablet   Oral   Take 1 tablet by mouth 2 (two) times daily.         . Multiple Vitamin (MULTIVITAMIN WITH MINERALS) TABS   Oral   Take 1 tablet by mouth daily.         . nitroGLYCERIN (NITROSTAT) 0.4 MG SL tablet   Sublingual   Place 0.4 mg under the tongue every 5 (five) minutes as needed. Chest pain         . omeprazole (PRILOSEC) 20 MG capsule   Oral   Take 40 mg by mouth daily.         . ondansetron (ZOFRAN) 4 MG tablet   Oral   Take 4 mg by mouth every 6 (six) hours as needed for nausea.         Marland Kitchen oxyCODONE (OXY IR/ROXICODONE) 5 MG immediate release tablet   Oral   Take 5 mg by mouth every 4 (four) hours as needed for pain.         Marland Kitchen oxyCODONE (OXYCONTIN) 10 MG 12 hr tablet   Oral   Take 1 tablet (10 mg total) by mouth every 12 (twelve) hours.   60 tablet   0   . polyethylene glycol (MIRALAX / GLYCOLAX) packet   Oral   Take 17 g by mouth 2 (two) times daily.          . simethicone (MYLICON) 80 MG chewable tablet   Oral   Chew 80 mg by mouth every 6 (six) hours as needed for flatulence.         . torsemide (DEMADEX) 20 MG tablet   Oral   Take 3 tablets (60 mg total) by mouth daily.         . traZODone (DESYREL) 50 MG tablet   Oral   Take 50 mg by mouth at bedtime.         Marland Kitchen warfarin (COUMADIN) 3 MG tablet   Oral   Take 3 mg by mouth daily.         . insulin glargine (LANTUS) 100 UNIT/ML injection   Subcutaneous   Inject 5 Units into the skin at bedtime.           BP 129/71  Pulse 81  Temp(Src) 98.3 F (36.8 C) (Oral)  Resp 13  SpO2 99%  Physical Exam  Nursing note  and vitals reviewed. Constitutional: She is oriented to person, place, and time. No distress.  Sitting comfortably in bed, well and nontoxic appearing, in NAD  HENT:  Head: Normocephalic and atraumatic.  Mouth/Throat: Oropharynx is clear and moist. No oropharyngeal exudate.  Symmetric rise of the uvula with phonation  Eyes: Conjunctivae and EOM are normal. Pupils are equal, round, and reactive to light. No scleral icterus.  Neck: Normal range of motion. Neck supple. No JVD present.  Cardiovascular: Normal rate, normal heart sounds and intact distal pulses.   Irregular rhythm  Pulmonary/Chest: Effort normal. No respiratory distress. She has no wheezes. She has no rales.  Mildly decreased breath sounds in RLL. Good inspiratory effort without accessory muscle use.  Abdominal: Soft. Bowel sounds are normal. She exhibits distension. There is no tenderness. There is no rebound and no guarding.  Musculoskeletal: She exhibits edema. She exhibits no tenderness.  2+ pitting edema in b/l lower extremities, more so in b/l ankles and feet  Lymphadenopathy:    She has no cervical adenopathy.  Neurological: She is alert and oriented to person, place, and time. No cranial nerve deficit. She exhibits normal muscle tone.  Cranial nerves 2 through 12 grossly intact. Patient exhibits equal grip strength b/l without sensory or motor deficits.   Skin: Skin is warm. No rash noted. She is not diaphoretic. No erythema.  Psychiatric: She has a normal mood and affect. Her behavior is normal.    ED Course  Procedures (including critical care time)  Labs Reviewed  CBC WITH DIFFERENTIAL - Abnormal; Notable for the following:    RBC 3.67 (*)    Hemoglobin 10.0 (*)    HCT 31.2 (*)    RDW 16.5 (*)    Platelets 129 (*)    All other components within normal limits  COMPREHENSIVE METABOLIC PANEL - Abnormal; Notable for the following:    Potassium 5.3 (*)    Chloride 94 (*)    CO2 36 (*)    Glucose, Bld 226 (*)     BUN 57 (*)    Creatinine, Ser 2.83 (*)    Alkaline Phosphatase 150 (*)    GFR calc non Af Amer 14 (*)    GFR calc Af Amer 17 (*)    All other components within normal limits  URINALYSIS, ROUTINE W REFLEX MICROSCOPIC - Abnormal; Notable for the following:    APPearance CLOUDY (*)    Nitrite POSITIVE (*)    Leukocytes, UA MODERATE (*)    All other components within normal limits  PRO B NATRIURETIC PEPTIDE - Abnormal; Notable for the following:    Pro B Natriuretic peptide (BNP) 3800.0 (*)    All other components within normal limits  PROTIME-INR - Abnormal; Notable for the following:    Prothrombin Time 25.3 (*)    INR 2.43 (*)    All other components within normal limits  URINE MICROSCOPIC-ADD ON - Abnormal; Notable for the following:    Squamous Epithelial / LPF FEW (*)    Bacteria, UA MANY (*)    Casts HYALINE CASTS (*)    All other components within normal limits  URINE CULTURE  POTASSIUM  TROPONIN I   Dg Chest 2 View  02/08/2013  *RADIOLOGY REPORT*  Clinical Data: Shortness of breath; shaky, nervous and weak.  CHEST - 2 VIEW  Comparison: Chest radiograph performed 01/06/2013  Findings: The lungs are well-aerated.  There is a persistent small to moderate right-sided pleural effusion, with patchy right basilar airspace opacification.  Underlying vascular congestion is seen. No pneumothorax identified.  The heart is mildly enlarged; calcification is noted in the aortic arch.  No acute osseous abnormalities are seen.  IMPRESSION:  1.  Persistent small to moderate right-sided pleural effusion, with patchy right basilar airspace opacification.  This may reflect pulmonary edema or possibly pneumonia. 2.  Underlying vascular congestion and mild cardiomegaly noted.   Original Report Authenticated By: Tonia Ghent, M.D.      Date: 02/08/2013  Rate: 89  Rhythm: atrial fibrillation  QRS Axis: indeterminate  Intervals: QT prolonged  ST/T Wave abnormalities: nonspecific ST changes   Conduction Disutrbances:incomplete RBBB and LAFB  Narrative Interpretation: Atrial fibrillation with signs of flutter; no STEMI  Old EKG Reviewed: further prolonged QT from prior on 01/03/2013; no other changes noted I have personally reviewed and interpreted this EKG.   1. Generalized weakness   2. Hyperkalemia   3. UTI (lower urinary tract infection)     MDM  Patient with hx of  CKD and CHF presents with generalized weakness x 3 days that waxing and waning in severity and unchanged from onset. Patient d/c from hospital 2 weeks ago after 3 week long stay for acute on chronic kidney disease and acute right congestive heart failure. Patient attributes symptoms to an increase in her Lasix dose on Saturday, stating symptoms worse with postural change. On exam patient is hemodynamically stable and neurovascularly intact; well and nontoxic appearing in NAD. W/u to include CBC, CMP, BNP, troponin, PT/INR, UA, EKG, and CXR.   W/u significant for UTI as well as creatinine of 2.83 and hyperkalemia to 5.3. CXR with evidence of persistent small/moderate R pleural effusion - pulmonary edema vs pneumonia. BNP elevated, though lower than 1 month prior. Spoke with Dr. Jae Dire from [facility] who will admit the patient to telemetry for further work up and evaluation. Dr. Betti Cruz requested patient be started on IV Rocephin for UTI which has been ordered. Patient without any complaints at this time. Patient seen, also, by Dr. Dierdre Highman with whom this patient's work up and management was discussed. Dr. Dierdre Highman to assume patient management until brought to the floor.   Filed Vitals:   02/08/13 2102 02/08/13 2145  BP: 125/75 129/71  Pulse: 78 81  Temp: 98.3 F (36.8 C)   TempSrc: Oral   Resp: 18 13  SpO2: 98% 99%         Antony Madura, PA-C 02/11/13 1042

## 2013-02-09 ENCOUNTER — Ambulatory Visit (HOSPITAL_COMMUNITY): Payer: Medicare Other

## 2013-02-09 ENCOUNTER — Encounter (HOSPITAL_COMMUNITY): Payer: Self-pay | Admitting: Internal Medicine

## 2013-02-09 DIAGNOSIS — D638 Anemia in other chronic diseases classified elsewhere: Secondary | ICD-10-CM

## 2013-02-09 DIAGNOSIS — I5033 Acute on chronic diastolic (congestive) heart failure: Secondary | ICD-10-CM

## 2013-02-09 DIAGNOSIS — N189 Chronic kidney disease, unspecified: Secondary | ICD-10-CM

## 2013-02-09 DIAGNOSIS — I1310 Hypertensive heart and chronic kidney disease without heart failure, with stage 1 through stage 4 chronic kidney disease, or unspecified chronic kidney disease: Secondary | ICD-10-CM

## 2013-02-09 DIAGNOSIS — I4891 Unspecified atrial fibrillation: Secondary | ICD-10-CM

## 2013-02-09 DIAGNOSIS — N19 Unspecified kidney failure: Secondary | ICD-10-CM

## 2013-02-09 DIAGNOSIS — I5032 Chronic diastolic (congestive) heart failure: Secondary | ICD-10-CM

## 2013-02-09 DIAGNOSIS — N390 Urinary tract infection, site not specified: Secondary | ICD-10-CM

## 2013-02-09 LAB — CBC
Hemoglobin: 9.1 g/dL — ABNORMAL LOW (ref 12.0–15.0)
MCH: 26.9 pg (ref 26.0–34.0)
MCHC: 31.6 g/dL (ref 30.0–36.0)
Platelets: 122 10*3/uL — ABNORMAL LOW (ref 150–400)
RDW: 16.6 % — ABNORMAL HIGH (ref 11.5–15.5)

## 2013-02-09 LAB — GLUCOSE, CAPILLARY
Glucose-Capillary: 177 mg/dL — ABNORMAL HIGH (ref 70–99)
Glucose-Capillary: 207 mg/dL — ABNORMAL HIGH (ref 70–99)
Glucose-Capillary: 130 mg/dL — ABNORMAL HIGH (ref 70–99)

## 2013-02-09 LAB — TROPONIN I: Troponin I: <0.3 ng/mL (ref <0.30)

## 2013-02-09 LAB — BASIC METABOLIC PANEL
BUN: 56 mg/dL — ABNORMAL HIGH (ref 6–23)
Calcium: 9.8 mg/dL (ref 8.4–10.5)
Creatinine, Ser: 2.74 mg/dL — ABNORMAL HIGH (ref 0.50–1.10)
GFR calc Af Amer: 17 mL/min — ABNORMAL LOW (ref >90)
GFR calc non Af Amer: 15 mL/min — ABNORMAL LOW (ref >90)
Glucose, Bld: 165 mg/dL — ABNORMAL HIGH (ref 70–99)
Potassium: 4.5 mEq/L (ref 3.5–5.1)

## 2013-02-09 MED ORDER — ACETAMINOPHEN 325 MG PO TABS: 650.0000 mg | ORAL_TABLET | Freq: Four times a day (QID) | ORAL | Status: DC | PRN

## 2013-02-09 MED ORDER — OXYCODONE HCL 10 MG PO TB12: 10.0000 mg | ORAL_TABLET | Freq: Two times a day (BID) | ORAL | Status: DC

## 2013-02-09 MED ORDER — ADULT MULTIVITAMIN W/MINERALS CH
1.0000 | ORAL_TABLET | Freq: Every day | ORAL | Status: DC
  Administered 2013-02-09 – 2013-02-12 (×4): 1 via ORAL
  Filled 2013-02-09 (×5): qty 1

## 2013-02-09 MED ORDER — TRAZODONE HCL 50 MG PO TABS
50.0000 mg | ORAL_TABLET | Freq: Every day | ORAL | Status: DC
  Administered 2013-02-09 – 2013-02-12 (×4): 50 mg via ORAL
  Filled 2013-02-09 (×5): qty 1

## 2013-02-09 MED ORDER — SACCHAROMYCES BOULARDII 250 MG PO CAPS
250.0000 mg | ORAL_CAPSULE | Freq: Two times a day (BID) | ORAL | Status: DC
  Administered 2013-02-09 – 2013-02-12 (×8): 250 mg via ORAL
  Filled 2013-02-09 (×10): qty 1

## 2013-02-09 MED ORDER — ACETAMINOPHEN 650 MG RE SUPP: 650.0000 mg | Freq: Four times a day (QID) | RECTAL | Status: DC | PRN

## 2013-02-09 MED ORDER — GUAIFENESIN ER 600 MG PO TB12
600.0000 mg | ORAL_TABLET | Freq: Two times a day (BID) | ORAL | Status: DC
  Administered 2013-02-09 – 2013-02-12 (×8): 600 mg via ORAL
  Filled 2013-02-09 (×10): qty 1

## 2013-02-09 MED ORDER — DIPHENHYDRAMINE HCL 25 MG PO CAPS
25.0000 mg | ORAL_CAPSULE | Freq: Three times a day (TID) | ORAL | Status: DC | PRN
  Administered 2013-02-10: 25 mg via ORAL
  Filled 2013-02-09: qty 1

## 2013-02-09 MED ORDER — INSULIN ASPART 100 UNIT/ML ~~LOC~~ SOLN
0.0000 [IU] | Freq: Every day | SUBCUTANEOUS | Status: DC
  Administered 2013-02-12: 2 [IU] via SUBCUTANEOUS

## 2013-02-09 MED ORDER — INSULIN ASPART 100 UNIT/ML ~~LOC~~ SOLN
0.0000 [IU] | Freq: Three times a day (TID) | SUBCUTANEOUS | Status: DC
  Administered 2013-02-09: 3 [IU] via SUBCUTANEOUS
  Administered 2013-02-09 – 2013-02-10 (×4): 2 [IU] via SUBCUTANEOUS
  Administered 2013-02-11: 1 [IU] via SUBCUTANEOUS
  Administered 2013-02-11: 5 [IU] via SUBCUTANEOUS
  Administered 2013-02-11: 3 [IU] via SUBCUTANEOUS
  Administered 2013-02-12 (×2): 1 [IU] via SUBCUTANEOUS
  Administered 2013-02-12: 3 [IU] via SUBCUTANEOUS

## 2013-02-09 MED ORDER — TORSEMIDE 20 MG PO TABS
60.0000 mg | ORAL_TABLET | Freq: Every day | ORAL | Status: DC
  Administered 2013-02-09 – 2013-02-11 (×3): 60 mg via ORAL
  Filled 2013-02-09 (×4): qty 3

## 2013-02-09 MED ORDER — SODIUM CHLORIDE 0.9 % IJ SOLN
3.0000 mL | Freq: Two times a day (BID) | INTRAMUSCULAR | Status: DC
  Administered 2013-02-09 – 2013-02-12 (×9): 3 mL via INTRAVENOUS

## 2013-02-09 MED ORDER — DOCUSATE SODIUM 100 MG PO CAPS
100.0000 mg | ORAL_CAPSULE | Freq: Three times a day (TID) | ORAL | Status: DC
  Administered 2013-02-09 – 2013-02-13 (×12): 100 mg via ORAL
  Filled 2013-02-09 (×16): qty 1

## 2013-02-09 MED ORDER — PANTOPRAZOLE SODIUM 40 MG PO TBEC
40.0000 mg | DELAYED_RELEASE_TABLET | Freq: Every day | ORAL | Status: DC
  Administered 2013-02-09 – 2013-02-12 (×4): 40 mg via ORAL
  Filled 2013-02-09 (×4): qty 1

## 2013-02-09 MED ORDER — DEXTROSE 5 % IV SOLN
1.0000 g | INTRAVENOUS | Status: AC
  Administered 2013-02-09 – 2013-02-10 (×2): 1 g via INTRAVENOUS
  Filled 2013-02-09 (×2): qty 10

## 2013-02-09 MED ORDER — NITROGLYCERIN 0.4 MG SL SUBL: 0.4000 mg | SUBLINGUAL_TABLET | SUBLINGUAL | Status: DC | PRN

## 2013-02-09 MED ORDER — SIMETHICONE 80 MG PO CHEW: 80.0000 mg | CHEWABLE_TABLET | Freq: Four times a day (QID) | ORAL | Status: DC | PRN

## 2013-02-09 MED ORDER — ONDANSETRON HCL 4 MG PO TABS: 4.0000 mg | ORAL_TABLET | Freq: Four times a day (QID) | ORAL | Status: DC | PRN

## 2013-02-09 MED ORDER — OXYCODONE HCL ER 10 MG PO T12A
10.0000 mg | EXTENDED_RELEASE_TABLET | Freq: Two times a day (BID) | ORAL | Status: DC
  Administered 2013-02-09 – 2013-02-12 (×8): 10 mg via ORAL
  Filled 2013-02-09 (×8): qty 1

## 2013-02-09 MED ORDER — ONDANSETRON HCL 4 MG/2ML IJ SOLN: 4.0000 mg | Freq: Four times a day (QID) | INTRAMUSCULAR | Status: DC | PRN

## 2013-02-09 MED ORDER — POLYETHYLENE GLYCOL 3350 17 G PO PACK
17.0000 g | PACK | Freq: Two times a day (BID) | ORAL | Status: DC
  Administered 2013-02-09 – 2013-02-12 (×8): 17 g via ORAL
  Filled 2013-02-09 (×11): qty 1

## 2013-02-09 MED ORDER — ISOSORB DINITRATE-HYDRALAZINE 20-37.5 MG PO TABS
1.0000 | ORAL_TABLET | Freq: Two times a day (BID) | ORAL | Status: DC
  Administered 2013-02-09 – 2013-02-12 (×8): 1 via ORAL
  Filled 2013-02-09 (×10): qty 1

## 2013-02-09 MED ORDER — INSULIN GLARGINE 100 UNIT/ML ~~LOC~~ SOLN
5.0000 [IU] | Freq: Every day | SUBCUTANEOUS | Status: DC
  Administered 2013-02-09 – 2013-02-12 (×5): 5 [IU] via SUBCUTANEOUS
  Filled 2013-02-09 (×8): qty 0.05

## 2013-02-09 MED ORDER — OXYCODONE HCL 5 MG PO TABS
5.0000 mg | ORAL_TABLET | ORAL | Status: DC | PRN
  Administered 2013-02-09 – 2013-02-13 (×11): 5 mg via ORAL
  Filled 2013-02-09 (×8): qty 1

## 2013-02-09 MED ORDER — BUSPIRONE HCL 5 MG PO TABS
5.0000 mg | ORAL_TABLET | Freq: Two times a day (BID) | ORAL | Status: DC
  Administered 2013-02-09 – 2013-02-12 (×8): 5 mg via ORAL
  Filled 2013-02-09 (×10): qty 1

## 2013-02-09 MED ORDER — WARFARIN SODIUM 3 MG PO TABS
3.0000 mg | ORAL_TABLET | Freq: Every day | ORAL | Status: DC
  Administered 2013-02-09: 3 mg via ORAL
  Filled 2013-02-09 (×2): qty 1

## 2013-02-09 MED ORDER — ALLOPURINOL 100 MG PO TABS
100.0000 mg | ORAL_TABLET | Freq: Every day | ORAL | Status: DC
  Administered 2013-02-09 – 2013-02-12 (×4): 100 mg via ORAL
  Filled 2013-02-09 (×5): qty 1

## 2013-02-09 MED ORDER — AMIODARONE HCL 200 MG PO TABS
400.0000 mg | ORAL_TABLET | Freq: Every day | ORAL | Status: DC
  Administered 2013-02-09 – 2013-02-12 (×4): 400 mg via ORAL
  Filled 2013-02-09 (×5): qty 2

## 2013-02-09 MED ORDER — GLUCERNA SHAKE PO LIQD
237.0000 mL | ORAL | Status: DC
  Administered 2013-02-09 – 2013-02-12 (×4): 237 mL via ORAL

## 2013-02-09 MED ORDER — INSULIN ASPART 100 UNIT/ML ~~LOC~~ SOLN: 1.0000 [IU] | Freq: Three times a day (TID) | SUBCUTANEOUS | Status: DC

## 2013-02-09 MED ORDER — BIOTENE DRY MOUTH MT LIQD
15.0000 mL | Freq: Two times a day (BID) | OROMUCOSAL | Status: DC
  Administered 2013-02-09 – 2013-02-13 (×9): 15 mL via OROMUCOSAL

## 2013-02-09 MED ORDER — OXYCODONE HCL 5 MG PO TABS
5.0000 mg | ORAL_TABLET | Freq: Once | ORAL | Status: DC
  Filled 2013-02-09 (×4): qty 1

## 2013-02-09 MED ORDER — WARFARIN - PHARMACIST DOSING INPATIENT
Freq: Every day | Status: DC
  Administered 2013-02-09: 18:00:00

## 2013-02-09 MED ORDER — CARVEDILOL 25 MG PO TABS
25.0000 mg | ORAL_TABLET | Freq: Two times a day (BID) | ORAL | Status: DC
  Administered 2013-02-09 – 2013-02-13 (×9): 25 mg via ORAL
  Filled 2013-02-09 (×11): qty 1

## 2013-02-09 MED ORDER — DEXTROSE 5 % IV SOLN
1.0000 g | Freq: Once | INTRAVENOUS | Status: AC
  Administered 2013-02-09: 1 g via INTRAVENOUS
  Filled 2013-02-09: qty 10

## 2013-02-09 NOTE — Progress Notes (Signed)
[facility] PROGRESS NOTE Assessment/Plan: Acute on Chronic diastolic heart failure/Cardiorenal syndrome with renal failure: - Cr. Increase from baseline 1.4, Cr increase to 2.7. - Consult heart failure team. - She Has JVD, lower extremity edema, crackles at bases B/L, CXR Persistent small to moderate right-sided pleural effusion. - Last time she was in House, renal deemed her no a candidate for HD. - Torsemide, Coreg, BIdil,  - 700, weight minimally change from D/c on 3.21.2014. - Will d/w with HF team palliative/hospice consult.  Weakness generalized/UTI: - weakness likely related with UTI. - rocephin 4.8.2014. - UC  Atrial fibrillation/flutter: - Rate controlled.  - On 400 mg of amiodarone, which will need to be confirmed with cardiology.  - INR Thx.  Chronic kidney disease stage III  - Patient's creatinine in 3.21.2014 1.6 - She is fluid overloaded, with worsening Cr, see problem #1.  Hyperkalemia  - Resolved with rechecking the labs.   Diabetes  - Sliding scale insulin.  - controlled.  Hypertension: - stable continue current treatment. - see HF.  Anemia: - Likely due to chronic kidney disease and chronic disease. Hemoglobin stable.   ChronicThrombocytopenia: - Stable. Continue to monitor.     Code Status: full Family Communication: daughter  Disposition Plan: inpatietn   Consultants:  cardiology  Procedures:  none  Antibiotics:  Rocephin 4.8.2014  HPI/Subjective: - Complaining abdominal swelling. - generalized weakness. - mildy SOB  Objective: Filed Vitals:   02/08/13 2145 02/09/13 0000 02/09/13 0222 02/09/13 0532  BP: 129/71 126/76 148/87 114/57  Pulse: 81 84 73 66  Temp:   98 F (36.7 C) 97.9 F (36.6 C)  TempSrc:   Oral Oral  Resp: 13  17 17   Height:   5' 3.6" (1.615 m)   Weight:   98.158 kg (216 lb 6.4 oz)   SpO2: 99% 98% 97% 100%    Intake/Output Summary (Last 24 hours) at 02/09/13 0715 Last data filed at 02/09/13  0532  Gross per 24 hour  Intake      0 ml  Output    700 ml  Net   -700 ml   Filed Weights   02/09/13 0222  Weight: 98.158 kg (216 lb 6.4 oz)    Exam:  General: Alert, awake, oriented x3, in no acute distress.  HEENT: No bruits, no goiter. + JVD Heart: Regular rate and rhythm, without murmurs, rubs, gallops.  Lungs: Good air movement, decrease air sound at bases, crackles B/L Abdomen: Soft, nontender, nondistended, positive bowel sounds.  Neuro: Grossly intact, nonfocal.   Data Reviewed: Basic Metabolic Panel:  Recent Labs Lab 02/08/13 2139 02/08/13 2251 02/09/13 0600  NA 136  --  137  K 5.3* 4.8 4.5  CL 94*  --  95*  CO2 36*  --  35*  GLUCOSE 226*  --  165*  BUN 57*  --  56*  CREATININE 2.83*  --  2.74*  CALCIUM 10.0  --  9.8   Liver Function Tests:  Recent Labs Lab 02/08/13 2139  AST 22  ALT 16  ALKPHOS 150*  BILITOT 0.3  PROT 7.7  ALBUMIN 4.2   No results found for this basename: LIPASE, AMYLASE,  in the last 168 hours No results found for this basename: AMMONIA,  in the last 168 hours CBC:  Recent Labs Lab 02/08/13 2139 02/09/13 0600  WBC 6.0 5.7  NEUTROABS 4.6  --   HGB 10.0* 9.1*  HCT 31.2* 28.8*  MCV 85.0 85.2  PLT 129* 122*   Cardiac  Enzymes:  Recent Labs Lab 02/09/13 0039 02/09/13 0600  TROPONINI <0.30 <0.30   BNP (last 3 results)  Recent Labs  01/03/13 0800 02/08/13 2140  PROBNP 4706.0* 3800.0*   CBG:  Recent Labs Lab 02/09/13 0239  GLUCAP 177*    Recent Results (from the past 240 hour(s))  MRSA PCR SCREENING     Status: None   Collection Time    02/09/13  2:23 AM      Result Value Range Status   MRSA by PCR NEGATIVE  NEGATIVE Final   Comment:            The GeneXpert MRSA Assay (FDA     approved for NASAL specimens     only), is one component of a     comprehensive MRSA colonization     surveillance program. It is not     intended to diagnose MRSA     infection nor to guide or     monitor treatment for      MRSA infections.     Studies: Dg Chest 2 View  02/08/2013  *RADIOLOGY REPORT*  Clinical Data: Shortness of breath; shaky, nervous and weak.  CHEST - 2 VIEW  Comparison: Chest radiograph performed 01/06/2013  Findings: The lungs are well-aerated.  There is a persistent small to moderate right-sided pleural effusion, with patchy right basilar airspace opacification.  Underlying vascular congestion is seen. No pneumothorax identified.  The heart is mildly enlarged; calcification is noted in the aortic arch.  No acute osseous abnormalities are seen.  IMPRESSION:  1.  Persistent small to moderate right-sided pleural effusion, with patchy right basilar airspace opacification.  This may reflect pulmonary edema or possibly pneumonia. 2.  Underlying vascular congestion and mild cardiomegaly noted.   Original Report Authenticated By: Tonia Ghent, M.D.     Scheduled Meds: . allopurinol  100 mg Oral Daily  . amiodarone  400 mg Oral Daily  . antiseptic oral rinse  15 mL Mouth Rinse BID  . busPIRone  5 mg Oral Q12H  . carvedilol  25 mg Oral BID WC  . cefTRIAXone (ROCEPHIN)  IV  1 g Intravenous Q24H  . docusate sodium  100 mg Oral Q8H  . guaiFENesin  600 mg Oral BID  . insulin aspart  0-5 Units Subcutaneous QHS  . insulin aspart  0-9 Units Subcutaneous TID WC  . insulin glargine  5 Units Subcutaneous QHS  . isosorbide-hydrALAZINE  1 tablet Oral BID  . multivitamin with minerals  1 tablet Oral Daily  . OxyCODONE  10 mg Oral Q12H  . pantoprazole  40 mg Oral Daily  . polyethylene glycol  17 g Oral BID  . saccharomyces boulardii  250 mg Oral BID  . sodium chloride  3 mL Intravenous Q12H  . torsemide  60 mg Oral Daily  . traZODone  50 mg Oral QHS  . warfarin  3 mg Oral q1800  . Warfarin - Pharmacist Dosing Inpatient   Does not apply q1800   Continuous Infusions:    Marinda Elk  [facility] Pager [phone]. If 8PM-8AM, please contact night-coverage at [email or web address], password  [facility] 02/09/2013, 7:15 AM  LOS: 1 day

## 2013-02-09 NOTE — Evaluation (Signed)
Physical Therapy Evaluation Patient Details Name: Gina Ashley MRN: [id number] DOB: 1927/10/16 Today's Date: 02/09/2013 Time: 6213-0865 PT Time Calculation (min): 26 min  PT Assessment / Plan / Recommendation Clinical Impression  Pt adm from [facility] with CHF.  Pt with nonhealing fx of LLE which still requires knee immobilizer and NWB on LLE.  Needs skilled PT to maximize I and safety so pt can eventually return home.    PT Assessment  Patient needs continued PT services    Follow Up Recommendations  SNF    Does the patient have the potential to tolerate intense rehabilitation      Barriers to Discharge        Equipment Recommendations  None recommended by PT    Recommendations for Other Services     Frequency Min 2X/week    Precautions / Restrictions Precautions Precautions: Fall Required Braces or Orthoses: Knee Immobilizer - Left Knee Immobilizer - Left: On when out of bed or walking Restrictions LLE Weight Bearing: Non weight bearing   Pertinent Vitals/Pain See flow sheet      Mobility  Bed Mobility Rolling Right: 4: Min assist;With rail Rolling Left: 5: Supervision;With rail Supine to Sit: 4: Min assist Sitting - Scoot to [place] of Bed: 4: Min assist Transfers Transfers: Risk manager: 1: +2 Total assist Anterior-Posterior Transfers: Patient Percentage: 50% Details for Transfer Assistance: Used posterior transfer to chair due to not having drop arm on recliner.  Used bed pad to facilitate scooting.    Exercises     PT Diagnosis: Difficulty walking;Generalized weakness  PT Problem List: Decreased strength;Decreased activity tolerance;Decreased balance;Decreased mobility;Obesity PT Treatment Interventions: DME instruction;Functional mobility training;Therapeutic activities;Therapeutic exercise;Wheelchair mobility training;Patient/family education;Balance training   PT Goals Acute Rehab PT Goals PT Goal  Formulation: With patient Time For Goal Achievement: 02/16/13 Potential to Achieve Goals: Good Pt will Roll Supine to Right Side: with supervision PT Goal: Rolling Supine to Right Side - Progress: Goal set today Pt will Roll Supine to Left Side: with modified independence PT Goal: Rolling Supine to Left Side - Progress: Goal set today Pt will go Supine/Side to Sit: with supervision PT Goal: Supine/Side to Sit - Progress: Goal set today Pt will go Sit to Supine/Side: with supervision PT Goal: Sit to Supine/Side - Progress: Goal set today Pt will go Sit to Stand: with mod assist PT Goal: Sit to Stand - Progress: Goal set today Pt will go Stand to Sit: with mod assist PT Goal: Stand to Sit - Progress: Goal set today Pt will Transfer Bed to Chair/Chair to Bed: with mod assist PT Transfer Goal: Bed to Chair/Chair to Bed - Progress: Goal set today  Visit Information  Last PT Received On: 02/09/13 Assistance Needed: +2    Subjective Data  Subjective: "That's hard," pt stated about doing posterior transfer.   Prior Functioning  Home Living Available Help at Discharge: Skilled Nursing Facility;Available 24 hours/day Type of Home: Skilled Nursing Facility Prior Function Level of Independence: Needs assistance Needs Assistance: Bathing;Dressing;Toileting;Transfers Bath: Moderate Dressing: Moderate Toileting: Moderate Transfer Assistance: was doing both scooting transfers and stand turn with walker per her report until edema, then unable to stand due to large swelling in both legs. Able to Take Stairs?: No Driving: No Communication Communication: No difficulties    Cognition  Cognition Overall Cognitive Status: Appears within functional limits for tasks assessed/performed Arousal/Alertness: Awake/alert Orientation Level: Appears intact for tasks assessed Behavior During Session: [facility] for tasks performed    Extremity/Trunk Assessment Right Lower  Extremity Assessment RLE  ROM/Strength/Tone Deficits: grossly 4/5 Left Lower Extremity Assessment LLE ROM/Strength/Tone: Deficits LLE ROM/Strength/Tone Deficits: Moves against gravity with assist.  Pt with knee immobilizer on.   Balance Static Sitting Balance Static Sitting - Balance Support: Bilateral upper extremity supported Static Sitting - Level of Assistance: 5: Stand by assistance  End of Session PT - End of Session Equipment Utilized During Treatment: Left knee immobilizer Activity Tolerance: Patient tolerated treatment well Patient left: in chair;with call bell/phone within reach;with family/visitor present Nurse Communication: Mobility status;Need for lift equipment  GP     Gina Ashley 02/09/2013, 2:37 PM  [facility] PT [phone]

## 2013-02-09 NOTE — Progress Notes (Signed)
Pt complains of severe pain- calling night shift to increase medication from 5mg  to 10 mg. Awaiting response.

## 2013-02-09 NOTE — Progress Notes (Signed)
INITIAL NUTRITION ASSESSMENT  DOCUMENTATION CODES Per approved criteria  -Obesity Unspecified   INTERVENTION: 1. Glucerna Shake po daily, each supplement provides 220 kcal and 10 grams of protein.  2. RD will continue to follow   NUTRITION DIAGNOSIS: Predicted Sub optimal oral intake related to weakness and UTI as evidenced by Pt reported no appetite.   Goal: PO intake to meet >/=90% estimated nutrition needs  Monitor:  PO intake, weight trends, labs, I/O's  Reason for Assessment: Malnutrition Screening Tool  79 y.o. female  Admitting Dx: Weakness generalized  ASSESSMENT: Pt is a resident of [facility]. Was recently at [facility] for a UTI. Pt with increased weakness and brought back to ED. Pt continues with UTI. Has a hx of CHF with volume overload. Currently weight is stable with last d/c.  Pt reports poor appetite for the past few days, eats a heart healthy diet at [facility] but thinks they put too much salt on the food.  Recent weight changes likely related to fluid status. Pt would like Glucerna shakes.   Height: Ht Readings from Last 1 Encounters:  02/09/13 5' 3.6" (1.615 m)    Weight: Wt Readings from Last 1 Encounters:  02/09/13 216 lb 6.4 oz (98.158 kg)    Ideal Body Weight: 118 lbs   % Ideal Body Weight: 183%  Wt Readings from Last 10 Encounters:  02/09/13 216 lb 6.4 oz (98.158 kg)  01/23/13 212 lb 6.4 oz (96.344 kg)  01/23/13 212 lb 6.4 oz (96.344 kg)  11/30/12 218 lb (98.884 kg)  11/30/12 218 lb (98.884 kg)  09/15/12 218 lb 4.1 oz (99 kg)  09/15/12 218 lb 4.1 oz (99 kg)  09/15/12 218 lb 4.1 oz (99 kg)  08/25/12 241 lb 2.9 oz (109.4 kg)  06/16/12 221 lb 5.5 oz (100.4 kg)    Usual Body Weight: 212 lbs per pt report   % Usual Body Weight: 101%  BMI:  Body mass index is 37.63 kg/(m^2). Obesity class 2  Estimated Nutritional Needs: Kcal: 1800-2000 Protein: 60-70  gm  Fluid: >/= 1.2 L   Skin: intact   Diet Order: Carb Control- no added salt    EDUCATION NEEDS: -No education needs identified at this time   Intake/Output Summary (Last 24 hours) at 02/09/13 0909 Last data filed at 02/09/13 0532  Gross per 24 hour  Intake     50 ml  Output    700 ml  Net   -650 ml    Last BM: PTA    Labs:   Recent Labs Lab 02/08/13 2139 02/08/13 2251 02/09/13 0600  NA 136  --  137  K 5.3* 4.8 4.5  CL 94*  --  95*  CO2 36*  --  35*  BUN 57*  --  56*  CREATININE 2.83*  --  2.74*  CALCIUM 10.0  --  9.8  GLUCOSE 226*  --  165*    CBG (last 3)   Recent Labs  02/09/13 0239 02/09/13 0806  GLUCAP 177* 153*    Scheduled Meds: . allopurinol  100 mg Oral Daily  . amiodarone  400 mg Oral Daily  . antiseptic oral rinse  15 mL Mouth Rinse BID  . busPIRone  5 mg Oral Q12H  . carvedilol  25 mg Oral BID WC  . cefTRIAXone (ROCEPHIN)  IV  1 g Intravenous Q24H  . docusate sodium  100 mg Oral Q8H  . guaiFENesin  600 mg Oral BID  . insulin aspart  0-5 Units Subcutaneous  QHS  . insulin aspart  0-9 Units Subcutaneous TID WC  . insulin glargine  5 Units Subcutaneous QHS  . isosorbide-hydrALAZINE  1 tablet Oral BID  . multivitamin with minerals  1 tablet Oral Daily  . OxyCODONE  10 mg Oral Q12H  . pantoprazole  40 mg Oral Daily  . polyethylene glycol  17 g Oral BID  . saccharomyces boulardii  250 mg Oral BID  . sodium chloride  3 mL Intravenous Q12H  . torsemide  60 mg Oral Daily  . traZODone  50 mg Oral QHS  . warfarin  3 mg Oral q1800  . Warfarin - Pharmacist Dosing Inpatient   Does not apply q1800    Continuous Infusions:   Past Medical History  Diagnosis Date  . GERD (gastroesophageal reflux disease)   . Back pain, chronic   . Pain in shoulder   . Pain in ankle   . Hiatal hernia   . HTN (hypertension)     all her life  . CKD (chronic kidney disease), stage III     GFR: 38  . Atrial fibrillation     since 1996  . Sleep apnea   . Fall 3 weeks ago    was evaluated at [facility]  . OSA on CPAP   . Depression   .  PONV (postoperative nausea and vomiting)     difficult to wake up  . Chronic diastolic heart failure 08/31/2012  . Tibia fracture     BILATERAL  . Renal failure     acute on chronic   . Fibula fracture     bilateral  . Respiratory failure     acute on chronic hx of  requiring BIPAP  . COPD (chronic obstructive pulmonary disease)   . Metabolic encephalopathy     hx of   . Renal failure     acute on chronic with need for intermittent dialysis - hx of   . UTI (urinary tract infection)     hx of   . Pleural effusion     right sided now resolved   . Diabetes mellitus     x 15 yrs, hx of hypoglycemia   . CHF (congestive heart failure)     diastolic HF    Past Surgical History  Procedure Laterality Date  . Bunionectomy      bilateral  . Cholecystectomy    . Rotator cuff repair      2002  . Total knee arthroplasty      bilateral 2001  . Hammer toe surgery      2004  . Colonoscopy  07/11/2011    Procedure: COLONOSCOPY;  Surgeon: Malissa Hippo, MD;  [facility];  Service: Endoscopy;  Laterality: N/A;  3:00  . Orif periprosthetic fracture  09/08/2012    Procedure: OPEN REDUCTION INTERNAL FIXATION (ORIF) PERIPROSTHETIC FRACTURE;  Surgeon: Shelda Pal, MD;  Location: [facility];  Service: Orthopedics;  Laterality: Right;  ORIF periprosthetic right proximal femur fracture Spanning external fixator left distal tibia  . External fixation leg  09/08/2012    Procedure: EXTERNAL FIXATION LEG;  Surgeon: Shelda Pal, MD;  Location: [facility];  Service: Orthopedics;  Laterality: Left;  . Joint replacement      bilateral knees   . External fixation leg  11/30/2012    Procedure: EXTERNAL FIXATION LEG;  Surgeon: Shelda Pal, MD;  Location: [facility];  Service: Orthopedics;  Laterality: Left;  Removal of External Fixation Left Knee with  Evaluation with Floroscopy  . Tee without cardioversion N/A 01/19/2013    Procedure: TRANSESOPHAGEAL ECHOCARDIOGRAM (TEE);  Surgeon: Lewayne Bunting,  MD;  Location: [facility] ENDOSCOPY;  Service: Cardiovascular;  Laterality: N/A;    Clarene Duke RD, LDN Pager [phone] After Hours pager [phone]

## 2013-02-09 NOTE — H&P (Signed)
Patient's PCP: Ernestine Conrad, MD  Chief Complaint: Generalized weakness  History of Present Illness: Gina Ashley is a 79 y.o. African American female with history of chronic kidney disease stage III, chronic diastolic heart failure, atrial fibrillation on chronic anticoagulation, hypertension, sleep apnea, depression, COPD, and diabetes who presents with the above complaints.  Patient was recently hospitalized from 01/02/2013 till 01/23/2013 for acute on chronic diastolic congestive heart failure.  During the prolonged hospitalization patient was placed on milrinone/dopamine and was aggressively diuresed.  She was also noted that patient may not be able to maintain her volume status her to the hospital and palliative care consultation was requested.  Patient in November of 2013 had multiple fractures in both lower extremities, she had right ORIF and left external fixation which has limited her mobility.  On 02/06/2013 she noted feeling anxious, nervous and had generalized weakness.  Her symptoms persisted for the last 3 days as a result she presented to the ER for further evaluation.  In the emergency department she was found to have urinary tract and mild hyperkalemia.  The hospitalist service was asked to admit the patient for further care and management.  Patient denies any recent fevers, chills, chest pain, shortness of breath, nausea, vomiting, abdominal pain, diarrhea, headaches or vision changes.   Review of Systems: All systems reviewed with the patient and positive as per history of present illness, otherwise all other systems are negative.  Past Medical History  Diagnosis Date  . GERD (gastroesophageal reflux disease)   . Back pain, chronic   . Pain in shoulder   . Pain in ankle   . Hiatal hernia   . HTN (hypertension)     all her life  . CKD (chronic kidney disease), stage III     GFR: 38  . Atrial fibrillation     since 1996  . Sleep apnea   . Fall 3 weeks ago    was evaluated  at [facility]  . OSA on CPAP   . Depression   . PONV (postoperative nausea and vomiting)     difficult to wake up  . Chronic diastolic heart failure 08/31/2012  . Tibia fracture     BILATERAL  . Renal failure     acute on chronic   . Fibula fracture     bilateral  . Respiratory failure     acute on chronic hx of  requiring BIPAP  . COPD (chronic obstructive pulmonary disease)   . Metabolic encephalopathy     hx of   . Renal failure     acute on chronic with need for intermittent dialysis - hx of   . UTI (urinary tract infection)     hx of   . Pleural effusion     right sided now resolved   . Diabetes mellitus     x 15 yrs, hx of hypoglycemia   . CHF (congestive heart failure)     diastolic HF   Past Surgical History  Procedure Laterality Date  . Bunionectomy      bilateral  . Cholecystectomy    . Rotator cuff repair      2002  . Total knee arthroplasty      bilateral 2001  . Hammer toe surgery      2004  . Colonoscopy  07/11/2011    Procedure: COLONOSCOPY;  Surgeon: Malissa Hippo, MD;  [facility];  Service: Endoscopy;  Laterality: N/A;  3:00  . Orif periprosthetic fracture  09/08/2012  Procedure: OPEN REDUCTION INTERNAL FIXATION (ORIF) PERIPROSTHETIC FRACTURE;  Surgeon: Shelda Pal, MD;  Location: [facility];  Service: Orthopedics;  Laterality: Right;  ORIF periprosthetic right proximal femur fracture Spanning external fixator left distal tibia  . External fixation leg  09/08/2012    Procedure: EXTERNAL FIXATION LEG;  Surgeon: Shelda Pal, MD;  Location: [facility];  Service: Orthopedics;  Laterality: Left;  . Joint replacement      bilateral knees   . External fixation leg  11/30/2012    Procedure: EXTERNAL FIXATION LEG;  Surgeon: Shelda Pal, MD;  Location: [facility];  Service: Orthopedics;  Laterality: Left;  Removal of External Fixation Left Knee with Evaluation with Floroscopy  . Tee without cardioversion N/A 01/19/2013    Procedure: TRANSESOPHAGEAL  ECHOCARDIOGRAM (TEE);  Surgeon: Lewayne Bunting, MD;  Location: [facility] ENDOSCOPY;  Service: Cardiovascular;  Laterality: N/A;   Family History  Problem Relation Age of Onset  . Colon cancer Brother    History   Social History  . Marital Status: Widowed    Spouse Name: N/A    Number of Children: N/A  . Years of Education: N/A   Occupational History  . Not on file.   Social History Main Topics  . Smoking status: Never Smoker   . Smokeless tobacco: Never Used  . Alcohol Use: No  . Drug Use: No  . Sexually Active: No   Other Topics Concern  . Not on file   Social History Narrative  . No narrative on file   Allergies: Morphine sulfate; Remeron; Tuna; and Penicillins  Home Meds: Prior to Admission medications   Medication Sig Start Date End Date Taking? Authorizing Provider  allopurinol (ZYLOPRIM) 100 MG tablet Take 100 mg by mouth daily.   Yes Historical Provider, MD  amiodarone (PACERONE) 200 MG tablet Take 2 tablets (400 mg total) by mouth daily. 01/22/13  Yes Erick Blinks, MD  busPIRone (BUSPAR) 5 MG tablet Take 5 mg by mouth every 12 (twelve) hours.   Yes Historical Provider, MD  carvedilol (COREG) 25 MG tablet Take 25 mg by mouth 2 (two) times daily with a meal.   Yes Historical Provider, MD  diphenhydrAMINE (BENADRYL) 25 mg capsule Take 25 mg by mouth every 8 (eight) hours as needed for itching.   Yes Historical Provider, MD  docusate sodium (COLACE) 100 MG capsule Take 100 mg by mouth every 8 (eight) hours.    Yes Historical Provider, MD  guaiFENesin (MUCINEX) 600 MG 12 hr tablet Take 600 mg by mouth 2 (two) times daily.    Yes Historical Provider, MD  HYDROcodone-acetaminophen (NORCO/VICODIN) 5-325 MG per tablet Take 1 tablet by mouth every 4 (four) hours as needed for pain. 01/25/13  Yes Tiffany L Reed, DO  insulin aspart (NOVOLOG) 100 UNIT/ML injection Inject 1-5 Units into the skin 3 (three) times daily with meals.  150-200=1unit,201-250=2units,251-300=3units,301-350=4units,351-400=5units if over 400 call MD 09/15/12  Yes Lesle Chris Black, NP  isosorbide-hydrALAZINE (BIDIL) 20-37.5 MG per tablet Take 1 tablet by mouth 2 (two) times daily. 01/22/13  Yes Erick Blinks, MD  Multiple Vitamin (MULTIVITAMIN WITH MINERALS) TABS Take 1 tablet by mouth daily.   Yes Historical Provider, MD  nitroGLYCERIN (NITROSTAT) 0.4 MG SL tablet Place 0.4 mg under the tongue every 5 (five) minutes as needed. Chest pain   Yes Historical Provider, MD  omeprazole (PRILOSEC) 20 MG capsule Take 40 mg by mouth daily.   Yes Historical Provider, MD  ondansetron (ZOFRAN) 4 MG tablet Take 4 mg  by mouth every 6 (six) hours as needed for nausea.   Yes Historical Provider, MD  oxyCODONE (OXY IR/ROXICODONE) 5 MG immediate release tablet Take 5 mg by mouth every 4 (four) hours as needed for pain.   Yes Historical Provider, MD  oxyCODONE (OXYCONTIN) 10 MG 12 hr tablet Take 1 tablet (10 mg total) by mouth every 12 (twelve) hours. 01/25/13  Yes Tiffany L Reed, DO  polyethylene glycol (MIRALAX / GLYCOLAX) packet Take 17 g by mouth 2 (two) times daily.    Yes Historical Provider, MD  simethicone (MYLICON) 80 MG chewable tablet Chew 80 mg by mouth every 6 (six) hours as needed for flatulence.   Yes Historical Provider, MD  torsemide (DEMADEX) 20 MG tablet Take 3 tablets (60 mg total) by mouth daily. 01/22/13  Yes Erick Blinks, MD  traZODone (DESYREL) 50 MG tablet Take 50 mg by mouth at bedtime.   Yes Historical Provider, MD  warfarin (COUMADIN) 3 MG tablet Take 3 mg by mouth daily.   Yes Historical Provider, MD  insulin glargine (LANTUS) 100 UNIT/ML injection Inject 5 Units into the skin at bedtime.    Historical Provider, MD    Physical Exam: Blood pressure 148/87, pulse 73, temperature 98 F (36.7 C), temperature source Oral, resp. rate 17, height 5' 3.6" (1.615 m), weight 98.158 kg (216 lb 6.4 oz), SpO2 97.00%. General: Awake, Oriented x3, No acute  distress. HEENT: EOMI, Moist mucous membranes Neck: Supple CV: S1 and S2 Lungs: Crackles at bases bilaterally, good respiratory effort. Abdomen: Soft, Nontender, Nondistended, +bowel sounds. Ext: Good pulses.  1-2+ lower extremity edema. No clubbing or cyanosis noted. Neuro: Cranial Nerves II-XII grossly intact. Has 5/5 motor strength in upper and lower extremities.   Lab results:  Recent Labs  02/08/13 2139 02/08/13 2251  NA 136  --   K 5.3* 4.8  CL 94*  --   CO2 36*  --   GLUCOSE 226*  --   BUN 57*  --   CREATININE 2.83*  --   CALCIUM 10.0  --     Recent Labs  02/08/13 2139  AST 22  ALT 16  ALKPHOS 150*  BILITOT 0.3  PROT 7.7  ALBUMIN 4.2   No results found for this basename: LIPASE, AMYLASE,  in the last 72 hours  Recent Labs  02/08/13 2139  WBC 6.0  NEUTROABS 4.6  HGB 10.0*  HCT 31.2*  MCV 85.0  PLT 129*    Recent Labs  02/09/13 0039  TROPONINI <0.30   No components found with this basename: POCBNP,  No results found for this basename: DDIMER,  in the last 72 hours No results found for this basename: HGBA1C,  in the last 72 hours No results found for this basename: CHOL, HDL, LDLCALC, TRIG, CHOLHDL, LDLDIRECT,  in the last 72 hours No results found for this basename: TSH, T4TOTAL, FREET3, T3FREE, THYROIDAB,  in the last 72 hours No results found for this basename: VITAMINB12, FOLATE, FERRITIN, TIBC, IRON, RETICCTPCT,  in the last 72 hours Imaging results:  Dg Chest 2 View  02/08/2013  *RADIOLOGY REPORT*  Clinical Data: Shortness of breath; shaky, nervous and weak.  CHEST - 2 VIEW  Comparison: Chest radiograph performed 01/06/2013  Findings: The lungs are well-aerated.  There is a persistent small to moderate right-sided pleural effusion, with patchy right basilar airspace opacification.  Underlying vascular congestion is seen. No pneumothorax identified.  The heart is mildly enlarged; calcification is noted in the aortic arch.  No acute  osseous  abnormalities are seen.  IMPRESSION:  1.  Persistent small to moderate right-sided pleural effusion, with patchy right basilar airspace opacification.  This may reflect pulmonary edema or possibly pneumonia. 2.  Underlying vascular congestion and mild cardiomegaly noted.   Original Report Authenticated By: Tonia Ghent, M.D.    Dg Knee 1-2 Views Left  01/20/2013  *RADIOLOGY REPORT*  Clinical Data: Left knee pain and swelling.  History of old trauma.  LEFT KNEE - 1-2 VIEW  Comparison: CT 12/01/2012.  Fluoroscopy 11/30/2012.  Left tibia and fibula 11/21/2012.  Left tibia and fibula 08/27/2012.  Findings: There are postoperative changes with left total knee arthroplasty and patellofemoral component.  There are pin tracks in the distal femoral and proximal tibial shaft.  There is a displaced fracture of the proximal left tibial metaphysis which has been present previously and is now demonstrating a healing with sclerosis in the fracture line and loss of distinction of the fracture line.  There is residual lateral displacement angulation of the distal fracture fragments.  Alignment and position is similar to previous studies.  There is no significant effusion.  No acute fractures are demonstrated.  IMPRESSION: Old postoperative and post-traumatic changes in the left knee as described.  There is an old healing fracture of the tibial metaphysis with line in position similar to previous study.  Left knee arthroplasty.  Pin tracks in the tibia and fibula.  No acute fractures are suggested.   Original Report Authenticated By: Burman Nieves, M.D.    Other results: EKG: Atrial flutter with heart rate of 89.  Assessment & Plan by Problem: Generalized weakness Likely related to urinary tract infection.  Request physical therapy consultation.  Urinary tract infection Patient reports that she has had chronic Foley since last year after surgery.  Urine culture has been sent.  Start ceftriaxone.  Suspect findings on  urine analysis likely reactive to the Foley.  If urine culture is negative, rapidly de-escalates antibiotics.  Atrial fibrillation/flutter Rate controlled.  Patient on a large dose of amiodarone, which will need to be confirmed with cardiology.  Uncertain if cardiology would consider lowering the amiodarone dose.  INR therapeutic on anticoagulation.  Chronic diastolic congestive heart failure Patient's last ejection fraction was 55-60% this had transesophageal echocardiogram March of 2014.  Patient has peripheral edema, but appears compensated at this time.  Continue home diuretics.  Minimize fluids.  Chronic kidney disease stage III Patient's creatinine in November of 2013 was 1.14.  Suspect patient needed to be on the dry side given she has had difficulty with volume status during previous hospitalization.  Continue to monitor.  Chronic Foley/urinary retention Discontinue Foley this morning and attempt voiding trial.  If patient fails consider reinserting foley at that time.  Hyperkalemia Likely due to hemolysis.  Resolved with rechecking the labs.  Diabetes Sliding scale insulin.  Continue home diabetic regimen.  Hypertension Continue home antihypertensive medications.  Stable.  Anemia Likely due to chronic kidney disease and chronic disease.  Hemoglobin stable.  Thrombocytopenia Stable.  Continue to monitor.  Obstructive sleep apnea Continue CPAP.  Depression Continue home medications.  History of C. Difficile Completed course of oral vancomycin on 01/31/2013.  Start florastar.  Prophylaxis INR therapeutic on anticoagulation.  CODE STATUS DO NOT RESUSCITATE/DO NOT INTUBATE.  This was discussed with the patient and daughter at the time of admission.  Patient wishes to pursue aggressive medical care.  Disposition Admit the patient as inpatient to telemetry.  Time spent on admission, talking to the  patient, and coordinating care was: 60 mins.  Alistar Mcenery A,  MD 02/09/2013, 2:54 AM

## 2013-02-09 NOTE — Plan of Care (Signed)
Problem: Phase I Progression Outcomes Goal: Initial discharge plan identified Outcome: Completed/Met Date Met:  02/09/13 To return to [facility] for rehab

## 2013-02-09 NOTE — Consult Note (Addendum)
Advanced Heart Failure Team Consult Note  Referring Physician: Dr Robb Matar Primary Physician: Primary Cardiologist:  Dr Gala Romney  Reason for Consultation: Heart Failure  HPI:   Gina Ashley is an 79 y.o. female with history of chronic atrial fibrillation on coumadin, diastolic/right heart failure and HTN. She also has DM type 2, COPD, OSA on CPAP and renal failure with baseline Cr 1.5-1.7. She has had 5 hospitalizations in the last 6 months, 1 included tibia/fibula fracture and the rest have been due to massive fluid overload in the setting of diastolic heart failure. She has required short term dialysis in the past for renal failure.   Most recent admit to [facility] 01/02/13 for massive volume overload. Diuresed over 45 pounds (262-> 216lbs) with IV lasix, milrinone and low-dose dopamine.  Creatinine bumped to low 2.7-2.8 range but was stable. She has h/o PAF and consideration was given to DC-CV to boost cardiac output and facilitate diuresis. However, TEE was done and there was question of LAA thrombus so no DC-CV performed. Amio continued. During that admission nephrology evaluated she was not a candidate for HD. Palliative Care also evaluated and she requested No CPR, compression, or intubation. Discharge weight 216 pounds. She was later discharged to [facility]   Echo EF 55-60% RV moderately HK  She presented to [facility] ED 02/08/13 with generalized weakness. UTI noted and she was started on ceftriaxone. Pertininent admission labs include CEs negative, creatinine 2.8, potassium 5.3, and. Pro BNP 3800. CXR persistent r sided small to moderate pleural effusion. Admit weight 216 pounds.  Says she is feeling much better. Denies SOB/PND/Orthopnea.    Review of Systems: [y] = yes, [ ]  = no   General: Weight gain [ ] ; Weight loss [ ] ; Anorexia [ ] ; Fatigue [ Y]; Fever [ ] ; Chills [ ] ; Weakness [ ]   Cardiac: Chest pain/pressure [ ] ; Resting SOB [ ] ; Exertional SOB [ ] ; Orthopnea [ ] ; Pedal Edema [ ] ;  Palpitations [ ] ; Syncope [ ] ; Presyncope [ ] ; Paroxysmal nocturnal dyspnea[ ]   Pulmonary: Cough [ ] ; Wheezing[ ] ; Hemoptysis[ ] ; Sputum [ ] ; Snoring [ ]   GI: Vomiting[ ] ; Dysphagia[ ] ; Melena[ ] ; Hematochezia [ ] ; Heartburn[ ] ; Abdominal pain [ ] ; Constipation [ ] ; Diarrhea [ ] ; BRBPR [ ]   GU: Hematuria[ ] ; Dysuria [Y ]; Nocturia[ ]   Vascular: Pain in legs with walking [ ] ; Pain in feet with lying flat [ ] ; Non-healing sores [ ] ; Stroke [ ] ; TIA [ ] ; Slurred speech [ ] ;  Neuro: Headaches[ ] ; Vertigo[ ] ; Seizures[ ] ; Paresthesias[ ] ;Blurred vision [ ] ; Diplopia [ ] ; Vision changes [ ]   Ortho/Skin: Arthritis [ ] ; Joint pain [Y ]; Muscle pain [ Y]; Joint swelling [ ] ; Back Pain [ ] ; Rash [ ]   Psych: Depression[ ] ; Anxiety[ ]   Heme: Bleeding problems [ ] ; Clotting disorders [ ] ; Anemia [ ]   Endocrine: Diabetes [ ] ; Thyroid dysfunction[ ]   Home Medications Prior to Admission medications   Medication Sig Start Date End Date Taking? Authorizing Provider  allopurinol (ZYLOPRIM) 100 MG tablet Take 100 mg by mouth daily.   Yes Historical Provider, MD  amiodarone (PACERONE) 200 MG tablet Take 2 tablets (400 mg total) by mouth daily. 01/22/13  Yes Erick Blinks, MD  busPIRone (BUSPAR) 5 MG tablet Take 5 mg by mouth every 12 (twelve) hours.   Yes Historical Provider, MD  carvedilol (COREG) 25 MG tablet Take 25 mg by mouth 2 (two) times daily with a meal.   Yes Historical  Provider, MD  diphenhydrAMINE (BENADRYL) 25 mg capsule Take 25 mg by mouth every 8 (eight) hours as needed for itching.   Yes Historical Provider, MD  docusate sodium (COLACE) 100 MG capsule Take 100 mg by mouth every 8 (eight) hours.    Yes Historical Provider, MD  guaiFENesin (MUCINEX) 600 MG 12 hr tablet Take 600 mg by mouth 2 (two) times daily.    Yes Historical Provider, MD  HYDROcodone-acetaminophen (NORCO/VICODIN) 5-325 MG per tablet Take 1 tablet by mouth every 4 (four) hours as needed for pain. 01/25/13  Yes Tiffany L Reed, DO   insulin aspart (NOVOLOG) 100 UNIT/ML injection Inject 1-5 Units into the skin 3 (three) times daily with meals. 150-200=1unit,201-250=2units,251-300=3units,301-350=4units,351-400=5units if over 400 call MD 09/15/12  Yes Lesle Chris Black, NP  isosorbide-hydrALAZINE (BIDIL) 20-37.5 MG per tablet Take 1 tablet by mouth 2 (two) times daily. 01/22/13  Yes Erick Blinks, MD  Multiple Vitamin (MULTIVITAMIN WITH MINERALS) TABS Take 1 tablet by mouth daily.   Yes Historical Provider, MD  nitroGLYCERIN (NITROSTAT) 0.4 MG SL tablet Place 0.4 mg under the tongue every 5 (five) minutes as needed. Chest pain   Yes Historical Provider, MD  omeprazole (PRILOSEC) 20 MG capsule Take 40 mg by mouth daily.   Yes Historical Provider, MD  ondansetron (ZOFRAN) 4 MG tablet Take 4 mg by mouth every 6 (six) hours as needed for nausea.   Yes Historical Provider, MD  oxyCODONE (OXY IR/ROXICODONE) 5 MG immediate release tablet Take 5 mg by mouth every 4 (four) hours as needed for pain.   Yes Historical Provider, MD  oxyCODONE (OXYCONTIN) 10 MG 12 hr tablet Take 1 tablet (10 mg total) by mouth every 12 (twelve) hours. 01/25/13  Yes Tiffany L Reed, DO  polyethylene glycol (MIRALAX / GLYCOLAX) packet Take 17 g by mouth 2 (two) times daily.    Yes Historical Provider, MD  simethicone (MYLICON) 80 MG chewable tablet Chew 80 mg by mouth every 6 (six) hours as needed for flatulence.   Yes Historical Provider, MD  torsemide (DEMADEX) 20 MG tablet Take 3 tablets (60 mg total) by mouth daily. 01/22/13  Yes Erick Blinks, MD  traZODone (DESYREL) 50 MG tablet Take 50 mg by mouth at bedtime.   Yes Historical Provider, MD  warfarin (COUMADIN) 3 MG tablet Take 3 mg by mouth daily.   Yes Historical Provider, MD  insulin glargine (LANTUS) 100 UNIT/ML injection Inject 5 Units into the skin at bedtime.    Historical Provider, MD    Past Medical History: Past Medical History  Diagnosis Date  . GERD (gastroesophageal reflux disease)   . Back pain,  chronic   . Pain in shoulder   . Pain in ankle   . Hiatal hernia   . HTN (hypertension)     all her life  . CKD (chronic kidney disease), stage III     GFR: 38  . Atrial fibrillation     since 1996  . Sleep apnea   . Fall 3 weeks ago    was evaluated at [facility]  . OSA on CPAP   . Depression   . PONV (postoperative nausea and vomiting)     difficult to wake up  . Chronic diastolic heart failure 08/31/2012  . Tibia fracture     BILATERAL  . Renal failure     acute on chronic   . Fibula fracture     bilateral  . Respiratory failure     acute on chronic hx of  requiring BIPAP  . COPD (chronic obstructive pulmonary disease)   . Metabolic encephalopathy     hx of   . Renal failure     acute on chronic with need for intermittent dialysis - hx of   . UTI (urinary tract infection)     hx of   . Pleural effusion     right sided now resolved   . Diabetes mellitus     x 15 yrs, hx of hypoglycemia   . CHF (congestive heart failure)     diastolic HF    Past Surgical History: Past Surgical History  Procedure Laterality Date  . Bunionectomy      bilateral  . Cholecystectomy    . Rotator cuff repair      2002  . Total knee arthroplasty      bilateral 2001  . Hammer toe surgery      2004  . Colonoscopy  07/11/2011    Procedure: COLONOSCOPY;  Surgeon: Malissa Hippo, MD;  Location: [facility];  Service: Endoscopy;  Laterality: N/A;  3:00  . Orif periprosthetic fracture  09/08/2012    Procedure: OPEN REDUCTION INTERNAL FIXATION (ORIF) PERIPROSTHETIC FRACTURE;  Surgeon: Shelda Pal, MD;  Location: [facility] ORS;  Service: Orthopedics;  Laterality: Right;  ORIF periprosthetic right proximal femur fracture Spanning external fixator left distal tibia  . External fixation leg  09/08/2012    Procedure: EXTERNAL FIXATION LEG;  Surgeon: Shelda Pal, MD;  Location: [facility] ORS;  Service: Orthopedics;  Laterality: Left;  . Joint replacement      bilateral knees   . External fixation leg   11/30/2012    Procedure: EXTERNAL FIXATION LEG;  Surgeon: Shelda Pal, MD;  Location: [facility] ORS;  Service: Orthopedics;  Laterality: Left;  Removal of External Fixation Left Knee with Evaluation with Floroscopy  . Tee without cardioversion N/A 01/19/2013    Procedure: TRANSESOPHAGEAL ECHOCARDIOGRAM (TEE);  Surgeon: Lewayne Bunting, MD;  Location: [facility] ENDOSCOPY;  Service: Cardiovascular;  Laterality: N/A;    Family History: Family History  Problem Relation Age of Onset  . Colon cancer Brother     Social History: History   Social History  . Marital Status: Widowed    Spouse Name: N/A    Number of Children: N/A  . Years of Education: N/A   Social History Main Topics  . Smoking status: Never Smoker   . Smokeless tobacco: Never Used  . Alcohol Use: No  . Drug Use: No  . Sexually Active: No   Other Topics Concern  . None   Social History Narrative  . None    Allergies:  Allergies  Allergen Reactions  . Morphine Sulfate Other (See Comments)    REACTION: change in personality  . Remeron (Mirtazapine) Other (See Comments)    Altered mental status, lethargy  . Tuna (Fish Allergy) Other (See Comments)    unknown  . Penicillins Rash    Tolerates Ancef.    Objective:    Vital Signs:   Temp:  [97.9 F (36.6 C)-98.3 F (36.8 C)] 97.9 F (36.6 C) (04/08 0532) Pulse Rate:  [66-84] 66 (04/08 0532) Resp:  [13-80] 80 (04/08 1100) BP: (114-148)/(57-87) 128/63 mmHg (04/08 1100) SpO2:  [97 %-100 %] 100 % (04/08 0532) Weight:  [98.158 kg (216 lb 6.4 oz)] 98.158 kg (216 lb 6.4 oz) (04/08 0222) Last BM Date: 02/08/13  Weight change: Filed Weights   02/09/13 0222  Weight: 98.158 kg (216 lb 6.4 oz)  Intake/Output:   Intake/Output Summary (Last 24 hours) at 02/09/13 1348 Last data filed at 02/09/13 0930  Gross per 24 hour  Intake     50 ml  Output    925 ml  Net   -875 ml     Physical Exam: General:  Chronically ill appearing. No resp difficulty HEENT:  normal Neck: supple. JVP to jaw . Carotids 2+ bilat; no bruits. No lymphadenopathy or thryomegaly appreciated. Cor: PMI nondisplaced. Irregular rate & rhythm. No rubs, gallops or murmurs. Lungs: Diminished on 2 liters Abdomen: soft, obese nontender, mildly distended.  No hepatosplenomegaly. No bruits or masses. Good bowel sounds. Extremities: no cyanosis, clubbing, rash, tr-1+ edema/ LLE in boot Neuro: alert & orientedx3, cranial nerves grossly intact. moves all 4 extremities w/o difficulty. Affect pleasant  Telemetry: A flutter  Labs: Basic Metabolic Panel:  Recent Labs Lab 02/08/13 2139 02/08/13 2251 02/09/13 0600  NA 136  --  137  K 5.3* 4.8 4.5  CL 94*  --  95*  CO2 36*  --  35*  GLUCOSE 226*  --  165*  BUN 57*  --  56*  CREATININE 2.83*  --  2.74*  CALCIUM 10.0  --  9.8    Liver Function Tests:  Recent Labs Lab 02/08/13 2139  AST 22  ALT 16  ALKPHOS 150*  BILITOT 0.3  PROT 7.7  ALBUMIN 4.2   No results found for this basename: LIPASE, AMYLASE,  in the last 168 hours No results found for this basename: AMMONIA,  in the last 168 hours  CBC:  Recent Labs Lab 02/08/13 2139 02/09/13 0600  WBC 6.0 5.7  NEUTROABS 4.6  --   HGB 10.0* 9.1*  HCT 31.2* 28.8*  MCV 85.0 85.2  PLT 129* 122*    Cardiac Enzymes:  Recent Labs Lab 02/09/13 0039 02/09/13 0600  TROPONINI <0.30 <0.30    BNP: BNP (last 3 results)  Recent Labs  01/03/13 0800 02/08/13 2140  PROBNP 4706.0* 3800.0*    CBG:  Recent Labs Lab 02/09/13 0239 02/09/13 0806 02/09/13 1213  GLUCAP 177* 153* 155*    Coagulation Studies:  Recent Labs  02/08/13 2252  LABPROT 25.3*  INR 2.43*    Other results: EKG: A flutter 89 bpm   Imaging: Dg Chest 2 View  02/08/2013  *RADIOLOGY REPORT*  Clinical Data: Shortness of breath; shaky, nervous and weak.  CHEST - 2 VIEW  Comparison: Chest radiograph performed 01/06/2013  Findings: The lungs are well-aerated.  There is a persistent small to  moderate right-sided pleural effusion, with patchy right basilar airspace opacification.  Underlying vascular congestion is seen. No pneumothorax identified.  The heart is mildly enlarged; calcification is noted in the aortic arch.  No acute osseous abnormalities are seen.  IMPRESSION:  1.  Persistent small to moderate right-sided pleural effusion, with patchy right basilar airspace opacification.  This may reflect pulmonary edema or possibly pneumonia. 2.  Underlying vascular congestion and mild cardiomegaly noted.   Original Report Authenticated By: Tonia Ghent, M.D.      Medications:     Current Medications: . allopurinol  100 mg Oral Daily  . amiodarone  400 mg Oral Daily  . antiseptic oral rinse  15 mL Mouth Rinse BID  . busPIRone  5 mg Oral Q12H  . carvedilol  25 mg Oral BID WC  . cefTRIAXone (ROCEPHIN)  IV  1 g Intravenous Q24H  . docusate sodium  100 mg Oral Q8H  . feeding supplement  237 mL Oral  Q24H  . guaiFENesin  600 mg Oral BID  . insulin aspart  0-5 Units Subcutaneous QHS  . insulin aspart  0-9 Units Subcutaneous TID WC  . insulin glargine  5 Units Subcutaneous QHS  . isosorbide-hydrALAZINE  1 tablet Oral BID  . multivitamin with minerals  1 tablet Oral Daily  . OxyCODONE  10 mg Oral Q12H  . pantoprazole  40 mg Oral Daily  . polyethylene glycol  17 g Oral BID  . saccharomyces boulardii  250 mg Oral BID  . sodium chloride  3 mL Intravenous Q12H  . torsemide  60 mg Oral Daily  . traZODone  50 mg Oral QHS  . warfarin  3 mg Oral q1800  . Warfarin - Pharmacist Dosing Inpatient   Does not apply q1800    Infusions:     Assessment:  1. UTI 2. Chronic diastolic and right sided heart failure (basleine weight 216 pounds) 3. Chronic renal failure. (baseline 2.7-2.8) 4. HTN  5. Obesity, suspect OHS/OSA  6. Chronic atrial flutter - recent TEE with LAA clot - on coumadin  7. Deconditioning  8. Metabolic alkalosis  9. DNR/DNI     Plan/Discussion:    Ms.  Ashley's weight and renal function are at her new baseline. While she does have a little fluid on boaad in setting of chronic R-sided CHF I would not try to diurese her aggressively given risk of worsening renal function. So unfortunately there is little else to offer her from this perspective. Would continue current regimen and treat UTI as per primary team. As noted from last admission she is not a candidate for hemodialysis.  If weight rending up can give intermittent IV lasix as needed.   Will follow at a distance. Call with questions.   Length of Stay: 1  Eltha Tingley 02/09/2013, 1:48 PM   Advanced Heart Failure Team Pager [phone] (7a - 4p)  Please contact [facility] for night-coverage after hours (4p -7a ) on [email or web address]

## 2013-02-09 NOTE — Progress Notes (Signed)
ANTICOAGULATION CONSULT NOTE - Initial Consult  Pharmacy Consult for Coumadin  Indication: atrial fibrillation  Allergies  Allergen Reactions  . Morphine Sulfate Other (See Comments)    REACTION: change in personality  . Remeron (Mirtazapine) Other (See Comments)    Altered mental status, lethargy  . Tuna (Fish Allergy) Other (See Comments)    unknown  . Penicillins Rash    Tolerates Ancef.    Patient Measurements: Height: 5' 3.6" (161.5 cm) Weight: 216 lb 6.4 oz (98.158 kg) IBW/kg (Calculated) : 53.78  Vital Signs: Temp: 98 F (36.7 C) (04/08 0222) Temp src: Oral (04/08 0222) BP: 148/87 mmHg (04/08 0222) Pulse Rate: 73 (04/08 0222)  Labs:  Recent Labs  02/08/13 2139 02/08/13 2252 02/09/13 0039  HGB 10.0*  --   --   HCT 31.2*  --   --   PLT 129*  --   --   LABPROT  --  25.3*  --   INR  --  2.43*  --   CREATININE 2.83*  --   --   TROPONINI  --   --  <0.30    Estimated Creatinine Clearance: 17 ml/min (by C-G formula based on Cr of 2.83).   Medical History: Past Medical History  Diagnosis Date  . GERD (gastroesophageal reflux disease)   . Back pain, chronic   . Pain in shoulder   . Pain in ankle   . Hiatal hernia   . HTN (hypertension)     all her life  . CKD (chronic kidney disease), stage III     GFR: 38  . Atrial fibrillation     since 1996  . Sleep apnea   . Fall 3 weeks ago    was evaluated at [facility]  . OSA on CPAP   . Depression   . PONV (postoperative nausea and vomiting)     difficult to wake up  . Chronic diastolic heart failure 08/31/2012  . Tibia fracture     BILATERAL  . Renal failure     acute on chronic   . Fibula fracture     bilateral  . Respiratory failure     acute on chronic hx of  requiring BIPAP  . COPD (chronic obstructive pulmonary disease)   . Metabolic encephalopathy     hx of   . Renal failure     acute on chronic with need for intermittent dialysis - hx of   . UTI (urinary tract infection)     hx of   .  Pleural effusion     right sided now resolved   . Diabetes mellitus     x 15 yrs, hx of hypoglycemia   . CHF (congestive heart failure)     diastolic HF    Medications:  Scheduled:  . allopurinol  100 mg Oral Daily  . amiodarone  400 mg Oral Daily  . busPIRone  5 mg Oral Q12H  . carvedilol  25 mg Oral BID WC  . [COMPLETED] cefTRIAXone (ROCEPHIN)  IV  1 g Intravenous Once  . cefTRIAXone (ROCEPHIN)  IV  1 g Intravenous Q24H  . docusate sodium  100 mg Oral Q8H  . guaiFENesin  600 mg Oral BID  . insulin aspart  0-5 Units Subcutaneous QHS  . insulin aspart  0-9 Units Subcutaneous TID WC  . insulin glargine  5 Units Subcutaneous QHS  . isosorbide-hydrALAZINE  1 tablet Oral BID  . multivitamin with minerals  1 tablet Oral Daily  . OxyCODONE  10 mg Oral Q12H  . pantoprazole  40 mg Oral Daily  . polyethylene glycol  17 g Oral BID  . sodium chloride  3 mL Intravenous Q12H  . torsemide  60 mg Oral Daily  . traZODone  50 mg Oral QHS  . [DISCONTINUED] insulin aspart  1-5 Units Subcutaneous TID WC  . [DISCONTINUED] oxyCODONE  10 mg Oral Q12H    Assessment: 79 yo female with h/o atrial fibrillation presented with generalized weakness x 3 days. Pharmacy consulted to manage Coumadin. INR (2.43) is at-goal. Home Coumadin regimen of 3mg  daily.   Goal of Therapy:  INR 2-3 Monitor platelets by anticoagulation protocol: Yes   Plan:  1. Continue home Coumadin regimen.  2. Daily PT / INR 3. Coumadin education with pharmacist  Emeline Gins 02/09/2013,3:07 AM

## 2013-02-09 NOTE — Progress Notes (Signed)
Admitted pt.to 5504,from ED ,bec.of generalized weakness since Sat.She is from [place].She is AAO X4;Oriented pt.to the room & call bell  & placed pt.on heart monitor & verified with central monitor.Fall risk assessment done & dsicussed the fall prevention safety plan & verbalized understanding & to call the nurse  Before getting OOB.Skin is intact except old scars from prevoius surgeries.Will continue to monitor pt.Marland KitchenMarland KitchenSonia Alyric Parkin,RN.

## 2013-02-09 NOTE — Clinical Social Work Psychosocial (Signed)
     Clinical Social Work Department BRIEF PSYCHOSOCIAL ASSESSMENT 02/09/2013  Patient:  Gina Ashley     Account Number:  [id number]     Admit date:  02/08/2013  Clinical Social Worker:  Hulan Fray  Date/Time:  02/09/2013 01:28 PM  Referred by:  Care Management  Date Referred:  02/09/2013 Referred for  Other - See comment   Other Referral:   admitted from facility   Interview type:  Patient Other interview type:    PSYCHOSOCIAL DATA Living Status:  FACILITY Admitted from facility:  [facility] Level of care:  Skilled Nursing Facility Primary support name:  [name] Primary support relationship to patient:  CHILD, ADULT Degree of support available:   supportive    CURRENT CONCERNS Current Concerns  Post-Acute Placement   Other Concerns:    SOCIAL WORK ASSESSMENT / PLAN Clinical Social Worker received referral for patient being admitted from facility, [facility]. Patient introduced self and explained reason for visit. Patient reported that she has been at [facility] and would prefer to do another SNF search. Patient reported that she would be agreeable for CSW to search in [place] and [place] Patient reported that she is agreeable for CSW to contact daughter to assist with new snf search. CSW will leave SNF packet with patient and will update patient and daughter when bed offers are made.    CSW will complete FL2 for MD's signature and continue to follow.   Assessment/plan status:  Psychosocial Support/Ongoing Assessment of Needs Other assessment/ plan:   Information/referral to community resources:   SNF packet    PATIENTS/FAMILYS RESPONSE TO PLAN OF CARE: Patient prefers for CSW to initiate a new SNF search as she does not want to return back to [place]. Patient was appreciative of CSW's visit and assistance with new SNF placement.

## 2013-02-09 NOTE — Care Management Note (Unsigned)
    Page 1 of 1   02/10/2013     10:56:10 AM   CARE MANAGEMENT NOTE 02/10/2013  Patient:  [facility]   Account Number:  [id number]  Date Initiated:  02/09/2013  Documentation initiated by:  Letha Cape  Subjective/Objective Assessment:   dx uti, chf, weakness  admit- from [facility].     Action/Plan:   Anticipated DC Date:  02/12/2013   Anticipated DC Plan:  SKILLED NURSING FACILITY  In-house referral  Clinical Social Worker      DC Planning Services  CM consult      Choice offered to / List presented to:             Status of service:  In process, will continue to follow Medicare Important Message given?   (If response is "NO", the following Medicare IM given date fields will be blank) Date Medicare IM given:   Date Additional Medicare IM given:    Discharge Disposition:    Per UR Regulation:  Reviewed for med. necessity/level of care/duration of stay  If discussed at Long Length of Stay Meetings, dates discussed:    Comments:  02/10/13 10:46 Letha Cape RN, BSN [phone] I faxed referral for sleep study to [facility], patient is scheduled for 5/5 but she will be put at the top of the waiting list if someone cancels.  I gave patient's daughter, Eunice Blase, the paper work to fill out so that it can be faxed back to the sleep center.  NCM will continue to follow for dc needs.  02/09/13 16:57 Letha Cape RN, BSN [phone] patient is from [facility], CSW referral.

## 2013-02-09 NOTE — Progress Notes (Signed)
Pt reports that she is having urine retention- RN did bladder scan and residue was only 3ml. will continue to monitor

## 2013-02-09 NOTE — Clinical Social Work Placement (Signed)
     Clinical Social Work Department CLINICAL SOCIAL WORK PLACEMENT NOTE 02/09/2013  Patient:  Gina Ashley, Gina Ashley  Account Number:  [id number] Admit date:  02/08/2013  Clinical Social Worker:  Hulan Fray  Date/time:  02/09/2013 01:35 PM  Clinical Social Work is seeking post-discharge placement for this patient at the following level of care:   SKILLED NURSING   (*CSW will update this form in Epic as items are completed)   02/09/2013  Patient/family provided with [facility] Department of Clinical Social Works list of facilities offering this level of care within the geographic area requested by the patient (or if unable, by the patients family).  02/09/2013  Patient/family informed of their freedom to choose among providers that offer the needed level of care, that participate in Medicare, Medicaid or managed care program needed by the patient, have an available bed and are willing to accept the patient.  02/09/2013  Patient/family informed of [facility] ownership interest in [facility], as well as of the fact that they are under no obligation to receive care at this facility.  PASARR submitted to EDS on 08/06/2012 PASARR number received from EDS on 08/06/2012  FL2 transmitted to all facilities in geographic area requested by pt/family on  02/09/2013 FL2 transmitted to all facilities within larger geographic area on   Patient informed that his/her managed care company has contracts with or will negotiate with  certain facilities, including the following:     Patient/family informed of bed offers received:   Patient chooses bed at  Physician recommends and patient chooses bed at    Patient to be transferred to  on   Patient to be transferred to facility by   The following physician request were entered in Epic:   Additional Comments:

## 2013-02-10 DIAGNOSIS — R5381 Other malaise: Secondary | ICD-10-CM

## 2013-02-10 LAB — PROTIME-INR: INR: 3.07 — ABNORMAL HIGH (ref 0.00–1.49)

## 2013-02-10 LAB — GLUCOSE, CAPILLARY: Glucose-Capillary: 101 mg/dL — ABNORMAL HIGH (ref 70–99)

## 2013-02-10 MED ORDER — WARFARIN SODIUM 1 MG PO TABS
1.0000 mg | ORAL_TABLET | Freq: Once | ORAL | Status: AC
  Administered 2013-02-10: 1 mg via ORAL
  Filled 2013-02-10: qty 1

## 2013-02-10 MED ORDER — IPRATROPIUM BROMIDE 0.02 % IN SOLN
0.5000 mg | Freq: Three times a day (TID) | RESPIRATORY_TRACT | Status: DC
  Administered 2013-02-10 – 2013-02-11 (×2): 0.5 mg via RESPIRATORY_TRACT
  Filled 2013-02-10 (×2): qty 2.5

## 2013-02-10 MED ORDER — LEVALBUTEROL HCL 0.63 MG/3ML IN NEBU
0.6300 mg | INHALATION_SOLUTION | Freq: Three times a day (TID) | RESPIRATORY_TRACT | Status: DC
  Administered 2013-02-10 – 2013-02-11 (×2): 0.63 mg via RESPIRATORY_TRACT
  Filled 2013-02-10 (×5): qty 3

## 2013-02-10 MED ORDER — ALBUTEROL SULFATE (5 MG/ML) 0.5% IN NEBU
2.5000 mg | INHALATION_SOLUTION | RESPIRATORY_TRACT | Status: DC | PRN
  Administered 2013-02-10: 2.5 mg via RESPIRATORY_TRACT
  Filled 2013-02-10 (×2): qty 0.5

## 2013-02-10 NOTE — Progress Notes (Signed)
Pt had a couple of runs of v-tach. Will is alert. Will continue to monitor

## 2013-02-10 NOTE — Progress Notes (Signed)
PATIENT DETAILS Name: Gina Ashley Age: 79 y.o. Sex: female Date of Birth: 03-21-1927 Admit Date: 02/08/2013 Admitting Physician Cristal Ford, MD [id number], Lyda Perone, MD  Subjective: No major complaints overnight  Assessment/Plan: Principal Problem:   Weakness generalized - Suspect this is multifactorial with elements of chronic congestive heart failure, renal failure, generalized deconditioning and possibly UTI  - Will need continued physical therapy  Active Problems: Complicated UTI - Remains afebrile and without leukocytosis since admission - UA however consistent with UTI, urine cultures positive for gram-negative rods-awaiting final sensitivity - Continue with Rocephin for now  Mild acute on chronic diastolic heart failure - Appreciate cardiology input - Would continue with Demadex 60 mg - Continue with Coreg  Chronic kidney disease stage 4 - Secondary to cardiorenal syndrome - Review of prior notes from his previous admission-patient is not a candidate for hemodialysis - Creatinine close to her usual baseline  Diabetes - CBGs stable - Continue with SSI and 5 units of Lantus  Atrial fibrillation - Rate controlled-on amiodarone and Coreg - Coumadin per pharmacy, INR slightly supratherapeutic at 3.07   anemia - Suspect secondary to chronic kidney disease - Monitor H&H periodically    OBSTRUCTIVE SLEEP APNEA - Patient is in the process of getting an appointment with the sleep Center for outpatient sleep study - Case management is working with the family to see if we can get a earlier appointment  Hypertension - Controlled with Coreg and BiDil  COPD - Lungs currently clear - Nebulized albuterol as needed  Gout - Stable - Continue with allopurinol  Disposition: Remain inpatient-SNF on discharge  DVT Prophylaxis: Not needed as on therapeutic anticoagulantion  Code Status:  DNR  Procedures:  None  CONSULTS:  cardiology  PHYSICAL EXAM: Vital  signs in last 24 hours: Filed Vitals:   02/09/13 1528 02/09/13 2033 02/10/13 0500 02/10/13 1002  BP: 116/58 139/79  136/79  Pulse: 80 79  84  Temp: 98.6 F (37 C) 97.9 F (36.6 C)  99.1 F (37.3 C)  TempSrc: Oral Oral  Oral  Resp: 18 20  18   Height:      Weight:   98.884 kg (218 lb)   SpO2: 100% 100%  99%    Weight change: 0.726 kg (1 lb 9.6 oz) Body mass index is 37.91 kg/(m^2).   Gen Exam: Awake and alert with clear speech.   Neck: Supple, No JVD.   Chest: B/L Clear except Few bibasilar rales CVS: S1 S2 Regular, no murmurs.  Abdomen: soft, BS +, non tender, non distended.  Extremities: no edema, lower extremities warm to touch. Neurologic: Non Focal.   Skin: No Rash.   Wounds: N/A.   Intake/Output from previous day:  Intake/Output Summary (Last 24 hours) at 02/10/13 1210 Last data filed at 02/10/13 5409  Gross per 24 hour  Intake    720 ml  Output   1476 ml  Net   -756 ml     LAB RESULTS: CBC  Recent Labs Lab 02/08/13 2139 02/09/13 0600  WBC 6.0 5.7  HGB 10.0* 9.1*  HCT 31.2* 28.8*  PLT 129* 122*  MCV 85.0 85.2  MCH 27.2 26.9  MCHC 32.1 31.6  RDW 16.5* 16.6*  LYMPHSABS 1.0  --   MONOABS 0.3  --   EOSABS 0.1  --   BASOSABS 0.0  --     Chemistries   Recent Labs Lab 02/08/13 2139 02/08/13 2251 02/09/13 0600  NA 136  --  137  K 5.3* 4.8 4.5  CL 94*  --  95*  CO2 36*  --  35*  GLUCOSE 226*  --  165*  BUN 57*  --  56*  CREATININE 2.83*  --  2.74*  CALCIUM 10.0  --  9.8    CBG:  Recent Labs Lab 02/09/13 0806 02/09/13 1213 02/09/13 1652 02/09/13 2141 02/10/13 0749  GLUCAP 153* 155* 207* 130* 101*    GFR Estimated Creatinine Clearance: 17.6 ml/min (by C-G formula based on Cr of 2.74).  Coagulation profile  Recent Labs Lab 02/08/13 2252 02/10/13 0510  INR 2.43* 3.07*    Cardiac Enzymes  Recent Labs Lab 02/09/13 0039 02/09/13 0600  TROPONINI <0.30 <0.30    No components found with this basename: POCBNP,  No results  found for this basename: DDIMER,  in the last 72 hours No results found for this basename: HGBA1C,  in the last 72 hours No results found for this basename: CHOL, HDL, LDLCALC, TRIG, CHOLHDL, LDLDIRECT,  in the last 72 hours No results found for this basename: TSH, T4TOTAL, FREET3, T3FREE, THYROIDAB,  in the last 72 hours No results found for this basename: VITAMINB12, FOLATE, FERRITIN, TIBC, IRON, RETICCTPCT,  in the last 72 hours No results found for this basename: LIPASE, AMYLASE,  in the last 72 hours  Urine Studies No results found for this basename: UACOL, UAPR, USPG, UPH, UTP, UGL, UKET, UBIL, UHGB, UNIT, UROB, ULEU, UEPI, UWBC, URBC, UBAC, CAST, CRYS, UCOM, BILUA,  in the last 72 hours  MICROBIOLOGY: Recent Results (from the past 240 hour(s))  URINE CULTURE     Status: None   Collection Time    02/09/13 12:33 AM      Result Value Range Status   Specimen Description URINE, CATHETERIZED   Final   Special Requests NONE   Final   Culture  Setup Time 02/09/2013 01:24   Final   Colony Count >=100,000 COLONIES/ML   Final   Culture GRAM NEGATIVE RODS   Final   Report Status PENDING   Incomplete  MRSA PCR SCREENING     Status: None   Collection Time    02/09/13  2:23 AM      Result Value Range Status   MRSA by PCR NEGATIVE  NEGATIVE Final   Comment:            The GeneXpert MRSA Assay (FDA     approved for NASAL specimens     only), is one component of a     comprehensive MRSA colonization     surveillance program. It is not     intended to diagnose MRSA     infection nor to guide or     monitor treatment for     MRSA infections.    RADIOLOGY STUDIES/RESULTS: Dg Chest 2 View  02/08/2013  *RADIOLOGY REPORT*  Clinical Data: Shortness of breath; shaky, nervous and weak.  CHEST - 2 VIEW  Comparison: Chest radiograph performed 01/06/2013  Findings: The lungs are well-aerated.  There is a persistent small to moderate right-sided pleural effusion, with patchy right basilar airspace  opacification.  Underlying vascular congestion is seen. No pneumothorax identified.  The heart is mildly enlarged; calcification is noted in the aortic arch.  No acute osseous abnormalities are seen.  IMPRESSION:  1.  Persistent small to moderate right-sided pleural effusion, with patchy right basilar airspace opacification.  This may reflect pulmonary edema or possibly pneumonia. 2.  Underlying vascular congestion and mild cardiomegaly noted.   Original Report Authenticated By: Tonia Ghent,  M.D.    Dg Knee 1-2 Views Left  01/20/2013  *RADIOLOGY REPORT*  Clinical Data: Left knee pain and swelling.  History of old trauma.  LEFT KNEE - 1-2 VIEW  Comparison: CT 12/01/2012.  Fluoroscopy 11/30/2012.  Left tibia and fibula 11/21/2012.  Left tibia and fibula 08/27/2012.  Findings: There are postoperative changes with left total knee arthroplasty and patellofemoral component.  There are pin tracks in the distal femoral and proximal tibial shaft.  There is a displaced fracture of the proximal left tibial metaphysis which has been present previously and is now demonstrating a healing with sclerosis in the fracture line and loss of distinction of the fracture line.  There is residual lateral displacement angulation of the distal fracture fragments.  Alignment and position is similar to previous studies.  There is no significant effusion.  No acute fractures are demonstrated.  IMPRESSION: Old postoperative and post-traumatic changes in the left knee as described.  There is an old healing fracture of the tibial metaphysis with line in position similar to previous study.  Left knee arthroplasty.  Pin tracks in the tibia and fibula.  No acute fractures are suggested.   Original Report Authenticated By: Burman Nieves, M.D.     MEDICATIONS: Scheduled Meds: . allopurinol  100 mg Oral Daily  . amiodarone  400 mg Oral Daily  . antiseptic oral rinse  15 mL Mouth Rinse BID  . busPIRone  5 mg Oral Q12H  . carvedilol  25 mg  Oral BID WC  . cefTRIAXone (ROCEPHIN)  IV  1 g Intravenous Q24H  . docusate sodium  100 mg Oral Q8H  . feeding supplement  237 mL Oral Q24H  . guaiFENesin  600 mg Oral BID  . insulin aspart  0-5 Units Subcutaneous QHS  . insulin aspart  0-9 Units Subcutaneous TID WC  . insulin glargine  5 Units Subcutaneous QHS  . isosorbide-hydrALAZINE  1 tablet Oral BID  . multivitamin with minerals  1 tablet Oral Daily  . oxyCODONE  5 mg Oral Once  . OxyCODONE  10 mg Oral Q12H  . pantoprazole  40 mg Oral Daily  . polyethylene glycol  17 g Oral BID  . saccharomyces boulardii  250 mg Oral BID  . sodium chloride  3 mL Intravenous Q12H  . torsemide  60 mg Oral Daily  . traZODone  50 mg Oral QHS  . warfarin  1 mg Oral ONCE-1800  . Warfarin - Pharmacist Dosing Inpatient   Does not apply q1800   Continuous Infusions:  PRN Meds:.acetaminophen, acetaminophen, albuterol, diphenhydrAMINE, nitroGLYCERIN, ondansetron (ZOFRAN) IV, ondansetron, oxyCODONE, simethicone  Antibiotics: Anti-infectives   Start     Dose/Rate Route Frequency Ordered Stop   02/09/13 2200  cefTRIAXone (ROCEPHIN) 1 g in dextrose 5 % 50 mL IVPB     1 g 100 mL/hr over 30 Minutes Intravenous Every 24 hours 02/09/13 0258 02/11/13 2159   02/09/13 0030  cefTRIAXone (ROCEPHIN) 1 g in dextrose 5 % 50 mL IVPB     1 g 100 mL/hr over 30 Minutes Intravenous  Once 02/09/13 0016 02/09/13 0124       Jeoffrey Massed, MD  [facility] Pager:[phone]  If 7PM-7AM, please contact night-coverage [email or web address] Password [email or web address] 02/10/2013, 12:10 PM   LOS: 2 days

## 2013-02-10 NOTE — Progress Notes (Signed)
ANTICOAGULATION CONSULT NOTE -follow up  Pharmacy Consult for Coumadin  Indication: atrial fibrillation  Allergies  Allergen Reactions  . Morphine Sulfate Other (See Comments)    REACTION: change in personality  . Remeron (Mirtazapine) Other (See Comments)    Altered mental status, lethargy  . Tuna (Fish Allergy) Other (See Comments)    unknown  . Penicillins Rash    Tolerates Ancef.    Patient Measurements: Height: 5' 3.6" (161.5 cm) Weight: 218 lb (98.884 kg) IBW/kg (Calculated) : 53.78  Vital Signs: Temp: 99.1 F (37.3 C) (04/09 1002) Temp src: Oral (04/09 1002) BP: 136/79 mmHg (04/09 1002) Pulse Rate: 84 (04/09 1002)  Labs:  Recent Labs  02/08/13 2139 02/08/13 2252 02/09/13 0039 02/09/13 0600 02/10/13 0510  HGB 10.0*  --   --  9.1*  --   HCT 31.2*  --   --  28.8*  --   PLT 129*  --   --  122*  --   LABPROT  --  25.3*  --   --  30.1*  INR  --  2.43*  --   --  3.07*  CREATININE 2.83*  --   --  2.74*  --   TROPONINI  --   --  <0.30 <0.30  --     Estimated Creatinine Clearance: 17.6 ml/min (by C-G formula based on Cr of 2.74).    Assessment: 79 yo female with h/o atrial fibrillation presented with generalized weakness x 3 days. Pharmacy consulted to manage Coumadin. INR up to 3.07 from 2.43 is slightly above goal. Home Coumadin regimen of 3mg  daily. No bleeding reported. PLTC low on both CBC checks (129 and 122).  On amio PTA, resumed.   Goal of Therapy:  INR 2-3 Monitor platelets by anticoagulation protocol: Yes   Plan:  1. discontinue home Coumadin regimen of 3 mg po daily 2. Coumadin 1 mg po x 1 dose today 3. Daily PT / INR Herby Abraham, Pharm.D. [phone] 02/10/2013 10:44 AM

## 2013-02-10 NOTE — Clinical Social Work Note (Signed)
Clinical Social Worker followed up with patient and her two daughters at beside regarding SNF bed offers received. CSW provided response list to patient and daughters. CSW will return tomorrow regarding confirmation on SNF choice.   Gina Ashley MSW, [employer] [phone]

## 2013-02-11 LAB — BASIC METABOLIC PANEL
BUN: 57 mg/dL — ABNORMAL HIGH (ref 6–23)
CO2: 34 mEq/L — ABNORMAL HIGH (ref 19–32)
Calcium: 9.5 mg/dL (ref 8.4–10.5)
GFR calc non Af Amer: 19 mL/min — ABNORMAL LOW (ref >90)
Glucose, Bld: 133 mg/dL — ABNORMAL HIGH (ref 70–99)
Sodium: 135 mEq/L (ref 135–145)

## 2013-02-11 LAB — GLUCOSE, CAPILLARY

## 2013-02-11 LAB — PROTIME-INR: Prothrombin Time: 26.5 seconds — ABNORMAL HIGH (ref 11.6–15.2)

## 2013-02-11 MED ORDER — FUROSEMIDE 10 MG/ML IJ SOLN
80.0000 mg | Freq: Once | INTRAMUSCULAR | Status: AC
  Administered 2013-02-11: 80 mg via INTRAVENOUS
  Filled 2013-02-11: qty 8

## 2013-02-11 MED ORDER — TORSEMIDE 20 MG PO TABS
60.0000 mg | ORAL_TABLET | Freq: Every day | ORAL | Status: DC
  Administered 2013-02-12: 60 mg via ORAL
  Filled 2013-02-11 (×2): qty 3

## 2013-02-11 MED ORDER — METOLAZONE 5 MG PO TABS
5.0000 mg | ORAL_TABLET | Freq: Every day | ORAL | Status: DC
  Administered 2013-02-11 – 2013-02-12 (×2): 5 mg via ORAL
  Filled 2013-02-11 (×3): qty 1

## 2013-02-11 MED ORDER — WARFARIN SODIUM 3 MG PO TABS
3.0000 mg | ORAL_TABLET | Freq: Once | ORAL | Status: AC
  Administered 2013-02-11: 3 mg via ORAL
  Filled 2013-02-11: qty 1

## 2013-02-11 NOTE — Progress Notes (Signed)
PATIENT DETAILS Name: Gina Ashley Age: 79 y.o. Sex: female Date of Birth: 1926/11/10 Admit Date: 02/08/2013 Admitting Physician Cristal Ford, MD [id number], Lyda Perone, MD  Subjective: No major complaints overnight  Assessment/Plan: Principal Problem:   Weakness generalized - Suspect this is multifactorial with elements of chronic congestive heart failure, renal failure, generalized deconditioning and possibly UTI  - Will need continued physical therapy while at SNF  Active Problems: Complicated UTI - Remains afebrile and without leukocytosis since admission - UA however consistent with UTI, urine cultures positive for gram-negative rods-awaiting final sensitivity - Continue with Rocephin for now  Mild acute on chronic diastolic heart failure - Appreciate cardiology input today-weight slightly up today - Will continue with Demadex 60 mg-but getting one dose of Lasix and Metalozone today-at the recommendation of the heart failure team - Continue with Coreg  Chronic kidney disease stage 4 - Secondary to cardiorenal syndrome - Review of prior notes from his previous admission-patient is not a candidate for hemodialysis - Creatinine close to her usual baseline-but now slowly downtrending  Diabetes - CBGs stable - Continue with SSI and 5 units of Lantus  Atrial fibrillation - Rate controlled-on amiodarone and Coreg - Coumadin per pharmacy, INR pending 4/10   anemia - Suspect secondary to chronic kidney disease - Monitor H&H periodically    OBSTRUCTIVE SLEEP APNEA - Patient is in the process of getting an appointment with the sleep Center for outpatient sleep study - Case management is working with the family to see if we can get a earlier appointment  Hypertension - Controlled with Coreg and BiDil  COPD - Lungs currently clear - Nebulized albuterol as needed  Gout - Stable - Continue with allopurinol  Disposition: Remain inpatient-SNF on discharge-await Final urine  culture results  DVT Prophylaxis: Not needed as on therapeutic anticoagulantion  Code Status:  DNR  Procedures:  None  CONSULTS:  cardiology  PHYSICAL EXAM: Vital signs in last 24 hours: Filed Vitals:   02/10/13 2115 02/10/13 2242 02/11/13 0621 02/11/13 0823  BP: 153/81  119/71   Pulse: 87 78 87   Temp: 97.4 F (36.3 C)  98.2 F (36.8 C)   TempSrc: Oral  Oral   Resp: 18 18 20    Height:      Weight:   99.837 kg (220 lb 1.6 oz)   SpO2: 100% 97% 95% 98%    Weight change: 0.953 kg (2 lb 1.6 oz) Body mass index is 38.28 kg/(m^2).   Gen Exam: Awake and alert with clear speech.   Neck: Supple, No JVD.   Chest: B/L Clear except Few bibasilar rales CVS: S1 S2 Regular, no murmurs.  Abdomen: soft, BS +, non tender, non distended.  Extremities: no edema, lower extremities warm to touch. Neurologic: Non Focal.   Skin: No Rash.   Wounds: N/A.   Intake/Output from previous day:  Intake/Output Summary (Last 24 hours) at 02/11/13 1000 Last data filed at 02/11/13 0622  Gross per 24 hour  Intake    600 ml  Output   1450 ml  Net   -850 ml     LAB RESULTS: CBC  Recent Labs Lab 02/08/13 2139 02/09/13 0600  WBC 6.0 5.7  HGB 10.0* 9.1*  HCT 31.2* 28.8*  PLT 129* 122*  MCV 85.0 85.2  MCH 27.2 26.9  MCHC 32.1 31.6  RDW 16.5* 16.6*  LYMPHSABS 1.0  --   MONOABS 0.3  --   EOSABS 0.1  --   BASOSABS 0.0  --  Chemistries   Recent Labs Lab 02/08/13 2139 02/08/13 2251 02/09/13 0600 02/11/13 0545  NA 136  --  137 135  K 5.3* 4.8 4.5 4.0  CL 94*  --  95* 93*  CO2 36*  --  35* 34*  GLUCOSE 226*  --  165* 133*  BUN 57*  --  56* 57*  CREATININE 2.83*  --  2.74* 2.29*  CALCIUM 10.0  --  9.8 9.5    CBG:  Recent Labs Lab 02/10/13 0749 02/10/13 1213 02/10/13 1732 02/10/13 2137 02/11/13 0744  GLUCAP 101* 178* 193* 163* 126*    GFR Estimated Creatinine Clearance: 21.2 ml/min (by C-G formula based on Cr of 2.29).  Coagulation profile  Recent  Labs Lab 02/08/13 2252 02/10/13 0510  INR 2.43* 3.07*    Cardiac Enzymes  Recent Labs Lab 02/09/13 0039 02/09/13 0600  TROPONINI <0.30 <0.30    No components found with this basename: POCBNP,  No results found for this basename: DDIMER,  in the last 72 hours No results found for this basename: HGBA1C,  in the last 72 hours No results found for this basename: CHOL, HDL, LDLCALC, TRIG, CHOLHDL, LDLDIRECT,  in the last 72 hours No results found for this basename: TSH, T4TOTAL, FREET3, T3FREE, THYROIDAB,  in the last 72 hours No results found for this basename: VITAMINB12, FOLATE, FERRITIN, TIBC, IRON, RETICCTPCT,  in the last 72 hours No results found for this basename: LIPASE, AMYLASE,  in the last 72 hours  Urine Studies No results found for this basename: UACOL, UAPR, USPG, UPH, UTP, UGL, UKET, UBIL, UHGB, UNIT, UROB, ULEU, UEPI, UWBC, URBC, UBAC, CAST, CRYS, UCOM, BILUA,  in the last 72 hours  MICROBIOLOGY: Recent Results (from the past 240 hour(s))  URINE CULTURE     Status: None   Collection Time    02/09/13 12:33 AM      Result Value Range Status   Specimen Description URINE, CATHETERIZED   Final   Special Requests NONE   Final   Culture  Setup Time 02/09/2013 01:24   Final   Colony Count >=100,000 COLONIES/ML   Final   Culture GRAM NEGATIVE RODS   Final   Report Status PENDING   Incomplete  MRSA PCR SCREENING     Status: None   Collection Time    02/09/13  2:23 AM      Result Value Range Status   MRSA by PCR NEGATIVE  NEGATIVE Final   Comment:            The GeneXpert MRSA Assay (FDA     approved for NASAL specimens     only), is one component of a     comprehensive MRSA colonization     surveillance program. It is not     intended to diagnose MRSA     infection nor to guide or     monitor treatment for     MRSA infections.    RADIOLOGY STUDIES/RESULTS: Dg Chest 2 View  02/08/2013  *RADIOLOGY REPORT*  Clinical Data: Shortness of breath; shaky, nervous and  weak.  CHEST - 2 VIEW  Comparison: Chest radiograph performed 01/06/2013  Findings: The lungs are well-aerated.  There is a persistent small to moderate right-sided pleural effusion, with patchy right basilar airspace opacification.  Underlying vascular congestion is seen. No pneumothorax identified.  The heart is mildly enlarged; calcification is noted in the aortic arch.  No acute osseous abnormalities are seen.  IMPRESSION:  1.  Persistent small to moderate  right-sided pleural effusion, with patchy right basilar airspace opacification.  This may reflect pulmonary edema or possibly pneumonia. 2.  Underlying vascular congestion and mild cardiomegaly noted.   Original Report Authenticated By: Tonia Ghent, M.D.    Dg Knee 1-2 Views Left  01/20/2013  *RADIOLOGY REPORT*  Clinical Data: Left knee pain and swelling.  History of old trauma.  LEFT KNEE - 1-2 VIEW  Comparison: CT 12/01/2012.  Fluoroscopy 11/30/2012.  Left tibia and fibula 11/21/2012.  Left tibia and fibula 08/27/2012.  Findings: There are postoperative changes with left total knee arthroplasty and patellofemoral component.  There are pin tracks in the distal femoral and proximal tibial shaft.  There is a displaced fracture of the proximal left tibial metaphysis which has been present previously and is now demonstrating a healing with sclerosis in the fracture line and loss of distinction of the fracture line.  There is residual lateral displacement angulation of the distal fracture fragments.  Alignment and position is similar to previous studies.  There is no significant effusion.  No acute fractures are demonstrated.  IMPRESSION: Old postoperative and post-traumatic changes in the left knee as described.  There is an old healing fracture of the tibial metaphysis with line in position similar to previous study.  Left knee arthroplasty.  Pin tracks in the tibia and fibula.  No acute fractures are suggested.   Original Report Authenticated By: Burman Nieves, M.D.     MEDICATIONS: Scheduled Meds: . allopurinol  100 mg Oral Daily  . amiodarone  400 mg Oral Daily  . antiseptic oral rinse  15 mL Mouth Rinse BID  . busPIRone  5 mg Oral Q12H  . carvedilol  25 mg Oral BID WC  . docusate sodium  100 mg Oral Q8H  . feeding supplement  237 mL Oral Q24H  . furosemide  80 mg Intravenous Once  . guaiFENesin  600 mg Oral BID  . insulin aspart  0-5 Units Subcutaneous QHS  . insulin aspart  0-9 Units Subcutaneous TID WC  . insulin glargine  5 Units Subcutaneous QHS  . ipratropium  0.5 mg Nebulization TID  . isosorbide-hydrALAZINE  1 tablet Oral BID  . levalbuterol  0.63 mg Nebulization TID  . metolazone  5 mg Oral Daily  . multivitamin with minerals  1 tablet Oral Daily  . oxyCODONE  5 mg Oral Once  . OxyCODONE  10 mg Oral Q12H  . pantoprazole  40 mg Oral Daily  . polyethylene glycol  17 g Oral BID  . saccharomyces boulardii  250 mg Oral BID  . sodium chloride  3 mL Intravenous Q12H  . torsemide  60 mg Oral Daily  . [START ON 02/12/2013] torsemide  60 mg Oral Daily  . traZODone  50 mg Oral QHS  . Warfarin - Pharmacist Dosing Inpatient   Does not apply q1800   Continuous Infusions:  PRN Meds:.acetaminophen, acetaminophen, albuterol, diphenhydrAMINE, nitroGLYCERIN, ondansetron (ZOFRAN) IV, ondansetron, oxyCODONE, simethicone  Antibiotics: Anti-infectives   Start     Dose/Rate Route Frequency Ordered Stop   02/09/13 2200  cefTRIAXone (ROCEPHIN) 1 g in dextrose 5 % 50 mL IVPB     1 g 100 mL/hr over 30 Minutes Intravenous Every 24 hours 02/09/13 0258 02/10/13 2322   02/09/13 0030  cefTRIAXone (ROCEPHIN) 1 g in dextrose 5 % 50 mL IVPB     1 g 100 mL/hr over 30 Minutes Intravenous  Once 02/09/13 0016 02/09/13 0124       Jeoffrey Massed, MD  [facility] Pager:[phone]  If 7PM-7AM, please contact night-coverage [email or web address] Password [email or web address] 02/11/2013, 10:00 AM   LOS: 3 days

## 2013-02-11 NOTE — Progress Notes (Signed)
Pt had a few runs of v-tach. Pt was receiving a bath at the time. Pt says that she is fine besides minor pains in her abd. Night coverage contacted

## 2013-02-11 NOTE — Progress Notes (Signed)
   HF team following from a distance. Weight trending up slowly. 216->220. Cr improved.  Would give dose of lasix 80 mg IV today with metolazone 5mg  x1.  Once discharged would provide instructions to take extra demadex dose (60 mg) for weight of 219 or greater. Shoot to keep weight 215-219.  Verlon Carcione,MD 8:58 AM

## 2013-02-11 NOTE — Progress Notes (Signed)
ANTICOAGULATION CONSULT NOTE -follow up  Pharmacy Consult for Coumadin  Indication: atrial fibrillation  Allergies  Allergen Reactions  . Morphine Sulfate Other (See Comments)    REACTION: change in personality  . Remeron (Mirtazapine) Other (See Comments)    Altered mental status, lethargy  . Tuna (Fish Allergy) Other (See Comments)    unknown  . Penicillins Rash    Tolerates Ancef.    Patient Measurements: Height: 5' 3.6" (161.5 cm) Weight: 220 lb 1.6 oz (99.837 kg) IBW/kg (Calculated) : 53.78  Vital Signs: Temp: 98.2 F (36.8 C) (04/10 0621) Temp src: Oral (04/10 0621) BP: 119/71 mmHg (04/10 0621) Pulse Rate: 87 (04/10 0621)  Labs:  Recent Labs  02/08/13 2139 02/08/13 2252 02/09/13 0039 02/09/13 0600 02/10/13 0510 02/11/13 0545 02/11/13 1125  HGB 10.0*  --   --  9.1*  --   --   --   HCT 31.2*  --   --  28.8*  --   --   --   PLT 129*  --   --  122*  --   --   --   LABPROT  --  25.3*  --   --  30.1*  --  26.5*  INR  --  2.43*  --   --  3.07*  --  2.59*  CREATININE 2.83*  --   --  2.74*  --  2.29*  --   TROPONINI  --   --  <0.30 <0.30  --   --   --     Estimated Creatinine Clearance: 21.2 ml/min (by C-G formula based on Cr of 2.29).    Assessment: 79 yo female with h/o atrial fibrillation resumed on coumadin. INR back down to 2.59 after given lower dose yesterday. Home Coumadin regimen of 3mg  daily. No bleeding reported. On amio PTA, resumed.   Goal of Therapy:  INR 2-3 Monitor platelets by anticoagulation protocol: Yes   Plan:  1. Coumadin 3mg  po x 1 2. Daily PT / INR  Bayard Hugger, PharmD, BCPS  Clinical Pharmacist  Pager: [phone]   02/11/2013 12:17 PM

## 2013-02-11 NOTE — Clinical Social Work Note (Signed)
Clinical Social Worker followed with her daughter, [name] and plan is to return back to [place]. CSW contacted Bjorn Loser, admissions coordinator and they are ready to receive patient when medically stable.   Rozetta Nunnery MSW, [employer] [phone]

## 2013-02-11 NOTE — Progress Notes (Signed)
O2 sats were 99% on 2L O2, patient still complaining of shortness of breath. Took oxygen down to 1L only per patient request. Will assess again shortly to wean more if possible. [place], [employer]

## 2013-02-11 NOTE — Progress Notes (Signed)
Physical Therapy Treatment Patient Details Name: Gina Ashley MRN: [id number] DOB: [birth year] Today's Date: 02/11/2013 Time: 0920-1001 PT Time Calculation (min): 41 min  PT Assessment / Plan / Recommendation Comments on Treatment Session  Pt mobility continues to be limited by generalized weakness and fatigue.        Follow Up Recommendations  SNF     Does the patient have the potential to tolerate intense rehabilitation     Barriers to Discharge        Equipment Recommendations  None recommended by PT    Recommendations for Other Services    Frequency Min 2X/week   Plan Discharge plan remains appropriate;Frequency remains appropriate    Precautions / Restrictions Precautions Precautions: Fall Required Braces or Orthoses: Knee Immobilizer - Left Knee Immobilizer - Left: On when out of bed or walking Restrictions Weight Bearing Restrictions: Yes LLE Weight Bearing: Non weight bearing   Pertinent Vitals/Pain C/o pain in LLE.  RN notified.      Mobility  Bed Mobility Bed Mobility: Supine to Sit;Sitting - Scoot to Edge of Bed Rolling Right: Not tested (comment) Rolling Left: Not tested (comment) Supine to Sit: 5: Supervision;HOB elevated Sitting - Scoot to Edge of Bed: 5: Supervision;With rail Sit to Supine: Not Tested (comment) Details for Bed Mobility Assistance: Pt able to manage bilateral LEs and trunkn without assistance.  Transfers Transfers: Sit to Stand;Stand to Sit;Anterior-Posterior Transfer Sit to Stand: 1: +2 Total assist;From bed;With upper extremity assist Sit to Stand: Patient Percentage: 40% Stand to Sit: 1: +2 Total assist;To bed;With upper extremity assist Stand to Sit: Patient Percentage: 50% Anterior-Posterior Transfer: 1: +2 Total assist Anterior-Posterior Transfers: Patient Percentage: 50% Lateral/Scoot Transfers: Not tested (comment) Details for Transfer Assistance: Pt performed sit <--> stand transfers x 3 from EOB with RW and +2 total assist.  Manual facilitation to maintain NWB in LLE.  Pt only able to stand for a few seconds (<8sec) each trial secondary to R LE weakness.  Pt required manual facilitation +2 assist to slide hips posteriorly to recliner from bed secondary to UE and generalized fatigue from standing trials.     Ambulation/Gait Ambulation/Gait Assistance: Not tested (comment) Wheelchair Mobility Wheelchair Mobility: No    Exercises     PT Diagnosis:    PT Problem List:   PT Treatment Interventions:     PT Goals Acute Rehab PT Goals PT Goal Formulation: With patient Time For Goal Achievement: 02/16/13 Potential to Achieve Goals: Good Pt will Roll Supine to Right Side: with supervision PT Goal: Rolling Supine to Right Side - Progress: Progressing toward goal Pt will Roll Supine to Left Side: with modified independence PT Goal: Rolling Supine to Left Side - Progress: Progressing toward goal Pt will go Supine/Side to Sit: with supervision PT Goal: Supine/Side to Sit - Progress: Met Pt will Sit at Edge of Bed: with modified independence;3-5 min;with unilateral upper extremity support PT Goal: Sit at Edge Of Bed - Progress: Met Pt will go Sit to Supine/Side: with supervision Pt will go Sit to Stand: with mod assist PT Goal: Sit to Stand - Progress: Progressing toward goal Pt will go Stand to Sit: with mod assist PT Goal: Stand to Sit - Progress: Progressing toward goal Pt will Transfer Bed to Chair/Chair to Bed: with mod assist PT Transfer Goal: Bed to Chair/Chair to Bed - Progress: Progressing toward goal Pt will Propel Wheelchair: with supervision;> 150 feet  Visit Information  Last PT Received On: 02/11/13    Subjective Data  Subjective:  I feel tired   Cognition  Cognition Overall Cognitive Status: Appears within functional limits for tasks assessed/performed Arousal/Alertness: Awake/alert Orientation Level: Appears intact for tasks assessed Behavior During Session: [facility] for tasks performed     Balance  Balance Balance Assessed: No  End of Session PT - End of Session Equipment Utilized During Treatment: Left knee immobilizer;Gait belt Activity Tolerance: Patient tolerated treatment well Patient left: in chair;with call bell/phone within reach;with family/visitor present Nurse Communication: Mobility status;Need for lift equipment   GP     Xee Hollman 02/11/2013, 11:51 AM Neema Fluegge L. Kyre Jeffries DPT [phone]

## 2013-02-12 DIAGNOSIS — R82998 Other abnormal findings in urine: Secondary | ICD-10-CM

## 2013-02-12 DIAGNOSIS — Z228 Carrier of other infectious diseases: Secondary | ICD-10-CM

## 2013-02-12 LAB — BASIC METABOLIC PANEL
BUN: 54 mg/dL — ABNORMAL HIGH (ref 6–23)
Calcium: 10.1 mg/dL (ref 8.4–10.5)
GFR calc non Af Amer: 22 mL/min — ABNORMAL LOW (ref >90)
Glucose, Bld: 123 mg/dL — ABNORMAL HIGH (ref 70–99)

## 2013-02-12 LAB — URINE CULTURE

## 2013-02-12 MED ORDER — ALBUTEROL SULFATE (2.5 MG/3ML) 0.083% IN NEBU: 2.5000 mg | INHALATION_SOLUTION | RESPIRATORY_TRACT | Status: DC | PRN

## 2013-02-12 MED ORDER — ALBUTEROL SULFATE (5 MG/ML) 0.5% IN NEBU: 2.5000 mg | INHALATION_SOLUTION | Freq: Three times a day (TID) | RESPIRATORY_TRACT | Status: DC

## 2013-02-12 MED ORDER — WARFARIN SODIUM 3 MG PO TABS
3.0000 mg | ORAL_TABLET | Freq: Every day | ORAL | Status: AC
  Administered 2013-02-12: 3 mg via ORAL
  Filled 2013-02-12: qty 1

## 2013-02-12 MED ORDER — OXYCODONE HCL 10 MG PO TB12: 10.0000 mg | ORAL_TABLET | Freq: Two times a day (BID) | ORAL | Status: DC

## 2013-02-12 MED ORDER — ONDANSETRON HCL 4 MG PO TABS: 4.0000 mg | ORAL_TABLET | Freq: Four times a day (QID) | ORAL | Status: DC | PRN

## 2013-02-12 MED ORDER — ALPRAZOLAM 0.25 MG PO TABS
0.2500 mg | ORAL_TABLET | Freq: Once | ORAL | Status: AC
  Administered 2013-02-12: 0.25 mg via ORAL
  Filled 2013-02-12: qty 1

## 2013-02-12 MED ORDER — GI COCKTAIL ~~LOC~~: 30.0000 mL | Freq: Three times a day (TID) | ORAL | Status: DC | PRN

## 2013-02-12 MED ORDER — SACCHAROMYCES BOULARDII 250 MG PO CAPS: 250.0000 mg | ORAL_CAPSULE | Freq: Two times a day (BID) | ORAL | Status: DC

## 2013-02-12 MED ORDER — LORAZEPAM 0.5 MG PO TABS
0.2500 mg | ORAL_TABLET | Freq: Once | ORAL | Status: AC
  Administered 2013-02-12: 0.25 mg via ORAL
  Filled 2013-02-12: qty 1

## 2013-02-12 MED ORDER — GLUCERNA SHAKE PO LIQD: 237.0000 mL | ORAL | Status: DC

## 2013-02-12 MED ORDER — OXYCODONE HCL 5 MG PO TABS: 5.0000 mg | ORAL_TABLET | ORAL | Status: DC | PRN

## 2013-02-12 MED ORDER — METOLAZONE 5 MG PO TABS: 5.0000 mg | ORAL_TABLET | Freq: Every day | ORAL | Status: DC

## 2013-02-12 MED ORDER — GI COCKTAIL ~~LOC~~
30.0000 mL | Freq: Three times a day (TID) | ORAL | Status: DC | PRN
  Administered 2013-02-13: 30 mL via ORAL
  Filled 2013-02-12 (×2): qty 30

## 2013-02-12 NOTE — Progress Notes (Signed)
PATIENT DETAILS Name: Gina Ashley Age: 79 y.o. Sex: female Date of Birth: 13-Sep-1927 Admit Date: 02/08/2013 Admitting Physician Cristal Ford, MD [id number], Lyda Perone, MD  Subjective: No major complaints overnight  Assessment/Plan: Principal Problem:   Weakness generalized - Suspect this is multifactorial with elements of chronic congestive heart failure, renal failure, generalized deconditioning and possibly UTI  - Will need continued physical therapy while at SNF  Active Problems: Complicated UTI - Remains afebrile and without leukocytosis since admission - UA however consistent with UTI, urine cultures positive for gram-negative rods-awaiting final sensitivity-spoke with micro lab-Likely growing Carbapenamase inhibitor K. Pneumoniae-likely a colonization rather than a true infection-spoke with Dr Zenaida Niece [place]-who suggested that we not treat this infection-has had similar culture results in March.Dr Daiva Eves will evaluate shortly as well. -  Mild acute on chronic diastolic heart failure - Appreciate cardiology input today-weight down today - given extra dosing of diuretics by cards yesterday - Continue with Coreg, current dosing of Metalozone and Demadex  Chronic kidney disease stage 4 - Secondary to cardiorenal syndrome - Review of prior notes from his previous admission-patient is not a candidate for hemodialysis - Creatinine close to her usual baseline-but now slowly downtrending  Diabetes - CBGs stable - Continue with SSI and 5 units of Lantus  Atrial fibrillation - Rate controlled-on amiodarone and Coreg - Coumadin per pharmacy, INR pending 4/10   anemia - Suspect secondary to chronic kidney disease - Monitor H&H periodically    OBSTRUCTIVE SLEEP APNEA - Patient is in the process of getting an appointment with the sleep Center for outpatient sleep study - Case management is working with the family to see if we can get a earlier appointment  Hypertension - Controlled  with Coreg and BiDil  COPD - Lungs currently clear - Nebulized albuterol as needed  Gout - Stable - Continue with allopurinol  Disposition: Remain inpatient-SNF on discharge-await Final urine culture results  DVT Prophylaxis: Not needed as on therapeutic anticoagulation  Code Status:  DNR  Procedures:  None  CONSULTS:  cardiology  PHYSICAL EXAM: Vital signs in last 24 hours: Filed Vitals:   02/11/13 2201 02/11/13 2300 02/12/13 0415 02/12/13 0802  BP: 146/81 150/81 144/88 151/91  Pulse: 97 63 95 107  Temp: 98 F (36.7 C)  98.2 F (36.8 C) 98.7 F (37.1 C)  TempSrc: Oral  Oral Oral  Resp: 18  16 18   Height:      Weight:   97.4 kg (214 lb 11.7 oz)   SpO2: 95%  100% 99%    Weight change: -2.437 kg (-5 lb 6 oz) Body mass index is 37.34 kg/(m^2).   Gen Exam: Awake and alert with clear speech.   Neck: Supple, No JVD.   Chest: B/L Clear except Few bibasilar rales CVS: S1 S2 Regular, no murmurs.  Abdomen: soft, BS +, non tender, non distended.  Extremities: 1-2 +edema, lower extremities warm to touch. Neurologic: Non Focal.   Skin: No Rash.   Wounds: N/A.   Intake/Output from previous day:  Intake/Output Summary (Last 24 hours) at 02/12/13 1055 Last data filed at 02/12/13 0730  Gross per 24 hour  Intake      0 ml  Output   2851 ml  Net  -2851 ml     LAB RESULTS: CBC  Recent Labs Lab 02/08/13 2139 02/09/13 0600  WBC 6.0 5.7  HGB 10.0* 9.1*  HCT 31.2* 28.8*  PLT 129* 122*  MCV 85.0 85.2  MCH 27.2 26.9  MCHC 32.1  31.6  RDW 16.5* 16.6*  LYMPHSABS 1.0  --   MONOABS 0.3  --   EOSABS 0.1  --   BASOSABS 0.0  --     Chemistries   Recent Labs Lab 02/08/13 2139 02/08/13 2251 02/09/13 0600 02/11/13 0545 02/12/13 0758  NA 136  --  137 135 136  K 5.3* 4.8 4.5 4.0 3.7  CL 94*  --  95* 93* 89*  CO2 36*  --  35* 34* 40*  GLUCOSE 226*  --  165* 133* 123*  BUN 57*  --  56* 57* 54*  CREATININE 2.83*  --  2.74* 2.29* 2.01*  CALCIUM 10.0  --   9.8 9.5 10.1    CBG:  Recent Labs Lab 02/11/13 0744 02/11/13 1221 02/11/13 1643 02/11/13 2207 02/12/13 0741  GLUCAP 126* 260* 205* 198* 128*    GFR Estimated Creatinine Clearance: 23.8 ml/min (by C-G formula based on Cr of 2.01).  Coagulation profile  Recent Labs Lab 02/08/13 2252 02/10/13 0510 02/11/13 1125 02/12/13 0758  INR 2.43* 3.07* 2.59* 2.41*    Cardiac Enzymes  Recent Labs Lab 02/09/13 0039 02/09/13 0600  TROPONINI <0.30 <0.30    No components found with this basename: POCBNP,  No results found for this basename: DDIMER,  in the last 72 hours No results found for this basename: HGBA1C,  in the last 72 hours No results found for this basename: CHOL, HDL, LDLCALC, TRIG, CHOLHDL, LDLDIRECT,  in the last 72 hours No results found for this basename: TSH, T4TOTAL, FREET3, T3FREE, THYROIDAB,  in the last 72 hours No results found for this basename: VITAMINB12, FOLATE, FERRITIN, TIBC, IRON, RETICCTPCT,  in the last 72 hours No results found for this basename: LIPASE, AMYLASE,  in the last 72 hours  Urine Studies No results found for this basename: UACOL, UAPR, USPG, UPH, UTP, UGL, UKET, UBIL, UHGB, UNIT, UROB, ULEU, UEPI, UWBC, URBC, UBAC, CAST, CRYS, UCOM, BILUA,  in the last 72 hours  MICROBIOLOGY: Recent Results (from the past 240 hour(s))  URINE CULTURE     Status: None   Collection Time    02/09/13 12:33 AM      Result Value Range Status   Specimen Description URINE, CATHETERIZED   Final   Special Requests NONE   Final   Culture  Setup Time 02/09/2013 01:24   Final   Colony Count >=100,000 COLONIES/ML   Final   Culture GRAM NEGATIVE RODS   Final   Report Status PENDING   Incomplete  MRSA PCR SCREENING     Status: None   Collection Time    02/09/13  2:23 AM      Result Value Range Status   MRSA by PCR NEGATIVE  NEGATIVE Final   Comment:            The GeneXpert MRSA Assay (FDA     approved for NASAL specimens     only), is one component of a      comprehensive MRSA colonization     surveillance program. It is not     intended to diagnose MRSA     infection nor to guide or     monitor treatment for     MRSA infections.    RADIOLOGY STUDIES/RESULTS: Dg Chest 2 View  02/08/2013  *RADIOLOGY REPORT*  Clinical Data: Shortness of breath; shaky, nervous and weak.  CHEST - 2 VIEW  Comparison: Chest radiograph performed 01/06/2013  Findings: The lungs are well-aerated.  There is a persistent small to  moderate right-sided pleural effusion, with patchy right basilar airspace opacification.  Underlying vascular congestion is seen. No pneumothorax identified.  The heart is mildly enlarged; calcification is noted in the aortic arch.  No acute osseous abnormalities are seen.  IMPRESSION:  1.  Persistent small to moderate right-sided pleural effusion, with patchy right basilar airspace opacification.  This may reflect pulmonary edema or possibly pneumonia. 2.  Underlying vascular congestion and mild cardiomegaly noted.   Original Report Authenticated By: Tonia Ghent, M.D.    Dg Knee 1-2 Views Left  01/20/2013  *RADIOLOGY REPORT*  Clinical Data: Left knee pain and swelling.  History of old trauma.  LEFT KNEE - 1-2 VIEW  Comparison: CT 12/01/2012.  Fluoroscopy 11/30/2012.  Left tibia and fibula 11/21/2012.  Left tibia and fibula 08/27/2012.  Findings: There are postoperative changes with left total knee arthroplasty and patellofemoral component.  There are pin tracks in the distal femoral and proximal tibial shaft.  There is a displaced fracture of the proximal left tibial metaphysis which has been present previously and is now demonstrating a healing with sclerosis in the fracture line and loss of distinction of the fracture line.  There is residual lateral displacement angulation of the distal fracture fragments.  Alignment and position is similar to previous studies.  There is no significant effusion.  No acute fractures are demonstrated.  IMPRESSION: Old  postoperative and post-traumatic changes in the left knee as described.  There is an old healing fracture of the tibial metaphysis with line in position similar to previous study.  Left knee arthroplasty.  Pin tracks in the tibia and fibula.  No acute fractures are suggested.   Original Report Authenticated By: Burman Nieves, M.D.     MEDICATIONS: Scheduled Meds: . allopurinol  100 mg Oral Daily  . amiodarone  400 mg Oral Daily  . antiseptic oral rinse  15 mL Mouth Rinse BID  . busPIRone  5 mg Oral Q12H  . carvedilol  25 mg Oral BID WC  . docusate sodium  100 mg Oral Q8H  . feeding supplement  237 mL Oral Q24H  . guaiFENesin  600 mg Oral BID  . insulin aspart  0-5 Units Subcutaneous QHS  . insulin aspart  0-9 Units Subcutaneous TID WC  . insulin glargine  5 Units Subcutaneous QHS  . isosorbide-hydrALAZINE  1 tablet Oral BID  . metolazone  5 mg Oral Daily  . multivitamin with minerals  1 tablet Oral Daily  . oxyCODONE  5 mg Oral Once  . OxyCODONE  10 mg Oral Q12H  . pantoprazole  40 mg Oral Daily  . polyethylene glycol  17 g Oral BID  . saccharomyces boulardii  250 mg Oral BID  . sodium chloride  3 mL Intravenous Q12H  . torsemide  60 mg Oral Daily  . traZODone  50 mg Oral QHS  . warfarin  3 mg Oral q1800  . Warfarin - Pharmacist Dosing Inpatient   Does not apply q1800   Continuous Infusions:  PRN Meds:.acetaminophen, acetaminophen, albuterol, diphenhydrAMINE, gi cocktail, nitroGLYCERIN, ondansetron (ZOFRAN) IV, ondansetron, oxyCODONE, simethicone  Antibiotics: Anti-infectives   Start     Dose/Rate Route Frequency Ordered Stop   02/09/13 2200  cefTRIAXone (ROCEPHIN) 1 g in dextrose 5 % 50 mL IVPB     1 g 100 mL/hr over 30 Minutes Intravenous Every 24 hours 02/09/13 0258 02/10/13 2322   02/09/13 0030  cefTRIAXone (ROCEPHIN) 1 g in dextrose 5 % 50 mL IVPB     1  g 100 mL/hr over 30 Minutes Intravenous  Once 02/09/13 0016 02/09/13 Leroy Libman, MD  [facility] Pager:[phone]  If 7PM-7AM, please contact night-coverage [email or web address] Password [facility] 02/12/2013, 10:55 AM   LOS: 4 days

## 2013-02-12 NOTE — Discharge Summary (Signed)
PATIENT DETAILS Name: Gina Ashley Age: 79 y.o. Sex: female Date of Birth: October 16, 1927 MRN: [id number]. Admit Date: 02/08/2013 Admitting Physician: Cristal Ford, MD OZH:[id number], Lyda Perone, MD  Recommendations for Outpatient Follow-up:  1. Likely has Klebsiella urinary colonization and not a real infection-please be judicious with antibiotic therapy in the future 2. Please check INR-once or twice a week as patient on Coumadin 3. Please check daily weights- and adjust dosing of diuretics 4. Please check electrolytes once or twice a month and follow renal function closely 5. 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 GERD (gastroesophageal reflux disease)   . Back pain, chronic   . Pain in shoulder   . Pain in ankle   . Hiatal hernia   . HTN (hypertension)     all her life  . CKD (chronic kidney disease), stage III     GFR: 38  . Atrial fibrillation     since 1996  . Sleep apnea   . Fall 3 weeks ago    was evaluated at [facility]  . OSA on CPAP   . Depression   . PONV (postoperative nausea and vomiting)     difficult to wake up  . Chronic diastolic heart failure 08/31/2012  . Tibia fracture     BILATERAL  . Renal failure     acute on chronic   . Fibula fracture     bilateral  . Respiratory failure     acute on chronic hx of  requiring BIPAP  . COPD (chronic obstructive pulmonary disease)   . Metabolic encephalopathy     hx of   . Renal  failure     acute on chronic with need for intermittent dialysis - hx of   . UTI (urinary tract infection)     hx of   . Pleural effusion     right sided now resolved   . Diabetes mellitus     x 15 yrs, hx of hypoglycemia   . CHF (congestive heart failure)     diastolic HF    DISCHARGE MEDICATIONS:   Medication List    STOP taking these medications       HYDROcodone-acetaminophen 5-325 MG per tablet  Commonly known as:  NORCO/VICODIN      TAKE these medications       albuterol (5 MG/ML) 0.5% nebulizer solution  Commonly known as:  PROVENTIL  Take 0.5 mLs (2.5 mg total) by nebulization 3 (three) times daily.     albuterol (2.5 MG/3ML) 0.083% nebulizer solution  Commonly known as:  PROVENTIL  Take 3 mLs (2.5 mg total) by nebulization every 2 (two) hours as needed for wheezing or shortness of breath.     allopurinol 100 MG tablet  Commonly known as:  ZYLOPRIM  Take 100 mg by mouth daily.     amiodarone 200 MG tablet  Commonly known as:  PACERONE  Take 2 tablets (400 mg total) by  mouth daily.     busPIRone 5 MG tablet  Commonly known as:  BUSPAR  Take 5 mg by mouth every 12 (twelve) hours.     carvedilol 25 MG tablet  Commonly known as:  COREG  Take 25 mg by mouth 2 (two) times daily with a meal.     diphenhydrAMINE 25 mg capsule  Commonly known as:  BENADRYL  Take 25 mg by mouth every 8 (eight) hours as needed for itching.     docusate sodium 100 MG capsule  Commonly known as:  COLACE  Take 100 mg by mouth every 8 (eight) hours.     feeding supplement Liqd  Take 237 mLs by mouth daily.     gi cocktail Susp suspension  Take 30 mLs by mouth 3 (three) times daily as needed for indigestion. Shake well.     guaiFENesin 600 MG 12 hr tablet  Commonly known as:  MUCINEX  Take 600 mg by mouth 2 (two) times daily.     insulin aspart 100 UNIT/ML injection  Commonly known as:  novoLOG  Inject 1-5 Units into the skin 3 (three) times daily with meals.  150-200=1unit,201-250=2units,251-300=3units,301-350=4units,351-400=5units if over 400 call MD     insulin glargine 100 UNIT/ML injection  Commonly known as:  LANTUS  Inject 5 Units into the skin at bedtime.     isosorbide-hydrALAZINE 20-37.5 MG per tablet  Commonly known as:  BIDIL  Take 1 tablet by mouth 2 (two) times daily.     metolazone 5 MG tablet  Commonly known as:  ZAROXOLYN  Take 1 tablet (5 mg total) by mouth daily.     multivitamin with minerals Tabs  Take 1 tablet by mouth daily.     nitroGLYCERIN 0.4 MG SL tablet  Commonly known as:  NITROSTAT  Place 0.4 mg under the tongue every 5 (five) minutes as needed. Chest pain     omeprazole 20 MG capsule  Commonly known as:  PRILOSEC  Take 40 mg by mouth daily.     ondansetron 4 MG tablet  Commonly known as:  ZOFRAN  Take 1 tablet (4 mg total) by mouth every 6 (six) hours as needed for nausea.     ondansetron 4 MG tablet  Commonly known as:  ZOFRAN  Take 4 mg by mouth every 6 (six) hours as needed for nausea.     oxyCODONE 5 MG immediate release tablet  Commonly known as:  Oxy IR/ROXICODONE  Take 1 tablet (5 mg total) by mouth every 4 (four) hours as needed for pain.     oxyCODONE 10 MG 12 hr tablet  Commonly known as:  OXYCONTIN  Take 1 tablet (10 mg total) by mouth every 12 (twelve) hours.     polyethylene glycol packet  Commonly known as:  MIRALAX / GLYCOLAX  Take 17 g by mouth 2 (two) times daily.     saccharomyces boulardii 250 MG capsule  Commonly known as:  FLORASTOR  Take 1 capsule (250 mg total) by mouth 2 (two) times daily.     simethicone 80 MG chewable tablet  Commonly known as:  MYLICON  Chew 80 mg by mouth every 6 (six) hours as needed for flatulence.     torsemide 20 MG tablet  Commonly known as:  DEMADEX  Take 3 tablets (60 mg total) by mouth daily.     traZODone 50 MG tablet  Commonly known as:  DESYREL  Take 50 mg by mouth at bedtime.     warfarin 3  MG tablet  Commonly known as:   COUMADIN  Take 3 mg by mouth daily.         BRIEF HPI:  See H&P, Labs, Consult and Test reports for all details in brief, 79 y.o. African American female with history of chronic kidney disease stage III, chronic diastolic heart failure, atrial fibrillation on chronic anticoagulation, hypertension, sleep apnea, depression, COPD, and diabetes who presented to the hospital for generalized weakness. She was then admitted for further evaluation and treatment.  CONSULTATIONS:   cardiology and ID  PERTINENT RADIOLOGIC STUDIES: Dg Chest 2 View  02/08/2013  *RADIOLOGY REPORT*  Clinical Data: Shortness of breath; shaky, nervous and weak.  CHEST - 2 VIEW  Comparison: Chest radiograph performed 01/06/2013  Findings: The lungs are well-aerated.  There is a persistent small to moderate right-sided pleural effusion, with patchy right basilar airspace opacification.  Underlying vascular congestion is seen. No pneumothorax identified.  The heart is mildly enlarged; calcification is noted in the aortic arch.  No acute osseous abnormalities are seen.  IMPRESSION:  1.  Persistent small to moderate right-sided pleural effusion, with patchy right basilar airspace opacification.  This may reflect pulmonary edema or possibly pneumonia. 2.  Underlying vascular congestion and mild cardiomegaly noted.   Original Report Authenticated By: Tonia Ghent, M.D.    Dg Knee 1-2 Views Left  01/20/2013  *RADIOLOGY REPORT*  Clinical Data: Left knee pain and swelling.  History of old trauma.  LEFT KNEE - 1-2 VIEW  Comparison: CT 12/01/2012.  Fluoroscopy 11/30/2012.  Left tibia and fibula 11/21/2012.  Left tibia and fibula 08/27/2012.  Findings: There are postoperative changes with left total knee arthroplasty and patellofemoral component.  There are pin tracks in the distal femoral and proximal tibial shaft.  There is a displaced fracture of the proximal left tibial metaphysis which has been present previously and is now demonstrating a  healing with sclerosis in the fracture line and loss of distinction of the fracture line.  There is residual lateral displacement angulation of the distal fracture fragments.  Alignment and position is similar to previous studies.  There is no significant effusion.  No acute fractures are demonstrated.  IMPRESSION: Old postoperative and post-traumatic changes in the left knee as described.  There is an old healing fracture of the tibial metaphysis with line in position similar to previous study.  Left knee arthroplasty.  Pin tracks in the tibia and fibula.  No acute fractures are suggested.   Original Report Authenticated By: Burman Nieves, M.D.      PERTINENT LAB RESULTS: CBC: No results found for this basename: WBC, HGB, HCT, PLT,  in the last 72 hours CMET CMP     Component Value Date/Time   NA 136 02/12/2013 0758   K 3.7 02/12/2013 0758   CL 89* 02/12/2013 0758   CO2 40* 02/12/2013 0758   GLUCOSE 123* 02/12/2013 0758   BUN 54* 02/12/2013 0758   CREATININE 2.01* 02/12/2013 0758   CALCIUM 10.1 02/12/2013 0758   PROT 7.7 02/08/2013 2139   ALBUMIN 4.2 02/08/2013 2139   AST 22 02/08/2013 2139   ALT 16 02/08/2013 2139   ALKPHOS 150* 02/08/2013 2139   BILITOT 0.3 02/08/2013 2139   GFRNONAA 22* 02/12/2013 0758   GFRAA 25* 02/12/2013 0758    GFR Estimated Creatinine Clearance: 23.8 ml/min (by C-G formula based on Cr of 2.01). No results found for this basename: LIPASE, AMYLASE,  in the last 72 hours No results found for this basename: CKTOTAL, CKMB,  CKMBINDEX, TROPONINI,  in the last 72 hours No components found with this basename: POCBNP,  No results found for this basename: DDIMER,  in the last 72 hours No results found for this basename: HGBA1C,  in the last 72 hours No results found for this basename: CHOL, HDL, LDLCALC, TRIG, CHOLHDL, LDLDIRECT,  in the last 72 hours No results found for this basename: TSH, T4TOTAL, FREET3, T3FREE, THYROIDAB,  in the last 72 hours No results found for this  basename: VITAMINB12, FOLATE, FERRITIN, TIBC, IRON, RETICCTPCT,  in the last 72 hours Coags:  Recent Labs  02/11/13 1125 02/12/13 0758  INR 2.59* 2.41*   Microbiology: Recent Results (from the past 240 hour(s))  URINE CULTURE     Status: None   Collection Time    02/09/13 12:33 AM      Result Value Range Status   Specimen Description URINE, CATHETERIZED   Final   Special Requests NONE   Final   Culture  Setup Time 02/09/2013 01:24   Final   Colony Count >=100,000 COLONIES/ML   Final   Culture GRAM NEGATIVE RODS   Final   Report Status PENDING   Incomplete  MRSA PCR SCREENING     Status: None   Collection Time    02/09/13  2:23 AM      Result Value Range Status   MRSA by PCR NEGATIVE  NEGATIVE Final   Comment:            The GeneXpert MRSA Assay (FDA     approved for NASAL specimens     only), is one component of a     comprehensive MRSA colonization     surveillance program. It is not     intended to diagnose MRSA     infection nor to guide or     monitor treatment for     MRSA infections.     BRIEF HOSPITAL COURSE:   Principal Problem:  Weakness generalized  - Suspect this is multifactorial with elements of chronic congestive heart failure, renal failure, generalized deconditioning and possibly UTI  - This is somewhat better with optimization off for CHF treatment. - Although initially attributed to UTI-looks like this is not infection and simply colonization. - Will need continued physical therapy while at SNF  Active Problems: ?Complicated UTI  - Remains afebrile and without leukocytosis since admission  - UA however consistent with UTI, urine cultures positive for gram-negative rods-awaiting final sensitivity-spoke with micro lab-Likely growing Carbapenamase inhibitor K. Pneumoniae-likely a colonization rather than a true infection-spoke with Dr Zenaida Niece [place]-who suggested that we not treat this infection-has had similar culture results in March - Empirically was on  Rocephin since admission, however this has been discontinued on 4/9 and the patient has been monitored off antibiotics. There is no fever or leukocytosis or any signs of infection.  Mild acute on chronic diastolic heart failure - Patient was seen by heart failure team during her hospitalization, diuretics dosing was adjusted. She was given extra dosing of diuretics during the hospital stay. - We plan to discharge her on the current dozing off metolazone, Demadex and Coreg. - She is not a dialysis candidate, she does have stage IV chronic kidney disease, we will need to tolerate some amount of mild fluid overload-so that the kidney function does not deteriorate further.  Chronic kidney disease stage 4  - Secondary to cardiorenal syndrome  - Review of prior notes from his previous admission-patient is not a candidate for hemodialysis  -  Creatinine close to her usual baseline-but now slowly downtrending  - June close electrolyte followup while in the skilled nursing facility  Diabetes  - CBGs stable during admission - Continue with SSI and 5 units of Lantus  Atrial fibrillation  - Rate controlled-on amiodarone and Coreg  - Coumadin was managed by pharmacy during the hospital stay, INR is therapeutic and the day of discharge. She will need close monitoring of her INR while in the skilled nursing facility  anemia  - Suspect secondary to chronic kidney disease  - Monitor H&H periodically   OBSTRUCTIVE SLEEP APNEA  - Patient is in the process of getting an appointment with the sleep Center for outpatient sleep study  - Case management is working with the family to see if we can get a earlier appointment   Hypertension  - Controlled with Coreg and BiDil   COPD  - Lungs currently clear  - Nebulized albuterol as needed   Gout  - Stable  - Continue with allopurinol  TODAY-DAY OF DISCHARGE:  Subjective:   [place] today has no headache,no chest abdominal pain,no new weakness  tingling or numbness, feels much better wants to go home today.   Objective:   Blood pressure 151/91, pulse 107, temperature 98.7 F (37.1 C), temperature source Oral, resp. rate 18, height 5' 3.6" (1.615 m), weight 97.4 kg (214 lb 11.7 oz), SpO2 99.00%.  Intake/Output Summary (Last 24 hours) at 02/12/13 1417 Last data filed at 02/12/13 1300  Gross per 24 hour  Intake      3 ml  Output   4120 ml  Net  -4117 ml    Exam Awake Alert, Oriented *3, No new F.N deficits, Normal affect [place].AT,PERRAL Supple Neck,No JVD, No cervical lymphadenopathy appriciated.  Symmetrical Chest wall movement, Good air movement bilaterally, CTAB RRR,No Gallops,Rubs or new Murmurs, No Parasternal Heave +ve B.Sounds, Abd Soft, Non tender, No organomegaly appriciated, No rebound -guarding or rigidity. No Cyanosis, Clubbing or edema, No new Rash or bruise  DISCHARGE CONDITION: Stable  DISPOSITION: SNF   DISCHARGE INSTRUCTIONS:    Activity:  As tolerated with Full fall precautions use walker/cane & assistance as needed  Diet recommendation: Diabetic Diet Heart Healthy diet   Follow-up Information   Follow up with [facility] On 03/08/2013. (patient will be put on the top of waiting list if someone else cancels)    Contact information:   [address] [place] [address] [phone]      Follow up with Arvilla Meres, MD. Schedule an appointment as soon as possible for a visit in 1 week. (Please make appointment at the heart failure clinic)    Contact information:   [address] [place] [address] [phone]      Total Time spent on discharge equals 45 minutes.  SignedJeoffrey Massed 02/12/2013 2:17 PM

## 2013-02-12 NOTE — Consult Note (Signed)
[facility]    Date of Admission:  02/08/2013  Date of Consult:  02/12/2013  Reason for Consult:CRE (klebsiella) in urine Referring Physician: Dr Jerral Ralph   HPI: Gina Ashley is an 79 y.o. 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 Patient was admitted for weakness and malaise. She has been treated empirically with ceftriaxone for possible urinary tract infection along with IV fluid and supportive care. Her urine cultures yet again growing a carbapenem is resistant Enterobactereciae  species from culture. Despite not receiving appropriate antibiotics for a CRE organism she has improved. It is my opinion that she does not currently have a active urinary tract infection with CRE but rather colonization. I would discontinue her Foley catheter and observe her clinically. Should she need therapy I would give her oral fosfomycin a dose of 3 g x1 and then repeated in 3 days. I certainly would not hazard colistin this patient Dewart he has end-stage renal disease.   Past Medical History  Diagnosis Date  . GERD (gastroesophageal reflux disease)   . Back pain, chronic   . Pain in shoulder   . Pain in ankle   . Hiatal hernia   . HTN (hypertension)     all her life  . CKD (chronic kidney disease), stage III     GFR: 38  . Atrial fibrillation     since 1996  . Sleep apnea   . Fall 3 weeks ago    was evaluated at [facility]  . OSA on CPAP   . Depression   . PONV (postoperative nausea and vomiting)     difficult to wake up  . Chronic diastolic heart failure 08/31/2012  . Tibia fracture     BILATERAL  . Renal failure     acute on chronic   . Fibula fracture     bilateral  . Respiratory failure     acute on chronic hx of   requiring BIPAP  . COPD (chronic obstructive pulmonary disease)   . Metabolic encephalopathy     hx of   . Renal failure     acute on chronic with need for intermittent dialysis - hx of   . UTI (urinary tract infection)     hx of   . Pleural effusion     right sided now resolved   . Diabetes mellitus     x 15 yrs, hx of hypoglycemia   . CHF (congestive heart failure)     diastolic HF    Past Surgical History  Procedure Laterality Date  . Bunionectomy      bilateral  . Cholecystectomy    . Rotator cuff repair      2002  . Total knee arthroplasty      bilateral 2001  . Hammer toe surgery      2004  . Colonoscopy  07/11/2011    Procedure: COLONOSCOPY;  Surgeon: Malissa Hippo, MD;  Location: [facility];  Service: Endoscopy;  Laterality: N/A;  3:00  . Orif periprosthetic fracture  09/08/2012    Procedure: OPEN REDUCTION INTERNAL FIXATION (ORIF) PERIPROSTHETIC FRACTURE;  Surgeon: Shelda Pal, MD;  Location: [facility];  Service: Orthopedics;  Laterality: Right;  ORIF periprosthetic right proximal femur fracture Spanning external fixator left distal tibia  . External  fixation leg  09/08/2012    Procedure: EXTERNAL FIXATION LEG;  Surgeon: Shelda Pal, MD;  Location: [facility];  Service: Orthopedics;  Laterality: Left;  . Joint replacement      bilateral knees   . External fixation leg  11/30/2012    Procedure: EXTERNAL FIXATION LEG;  Surgeon: Shelda Pal, MD;  Location: [facility];  Service: Orthopedics;  Laterality: Left;  Removal of External Fixation Left Knee with Evaluation with Floroscopy  . Tee without cardioversion N/A 01/19/2013    Procedure: TRANSESOPHAGEAL ECHOCARDIOGRAM (TEE);  Surgeon: Lewayne Bunting, MD;  Location: [facility] ENDOSCOPY;  Service: Cardiovascular;  Laterality: N/A;  ergies:   Allergies  Allergen Reactions  . Morphine Sulfate Other (See Comments)    REACTION: change in personality  . Remeron (Mirtazapine) Other (See Comments)    Altered mental status, lethargy   . Tuna (Fish Allergy) Other (See Comments)    unknown  . Penicillins Rash    Tolerates Ancef.     Medications: I have reviewed patients current medications as documented in Epic Anti-infectives   Start     Dose/Rate Route Frequency Ordered Stop   02/09/13 2200  cefTRIAXone (ROCEPHIN) 1 g in dextrose 5 % 50 mL IVPB     1 g 100 mL/hr over 30 Minutes Intravenous Every 24 hours 02/09/13 0258 02/10/13 2322   02/09/13 0030  cefTRIAXone (ROCEPHIN) 1 g in dextrose 5 % 50 mL IVPB     1 g 100 mL/hr over 30 Minutes Intravenous  Once 02/09/13 0016 02/09/13 0124      Social History:  reports that she has never smoked. She has never used smokeless tobacco. She reports that she does not drink alcohol or use illicit drugs.  Family History  Problem Relation Age of Onset  . Colon cancer Brother     As in HPI and primary teams notes otherwise 12 point review of systems is negative  Blood pressure 151/91, pulse 107, temperature 98.7 F (37.1 C), temperature source Oral, resp. rate 18, height 5' 3.6" (1.615 m), weight 214 lb 11.7 oz (97.4 kg), SpO2 99.00%. General: Alert and awake, oriented x not in any acute distress. HEENT: anicteric sclera, pupils reactive to light and accommodation, EOMI, oropharynx clear and without exudate CVS regular rate, normal r,  no murmur rubs or gallops Chest: clear to auscultation bilaterally, no wheezing, rales or rhonchi Abdomen: soft nontender, nondistended, normal bowel sounds, Extremities: no  clubbing or edema noted bilaterally Neuro: nonfocal, strength and sensation intact   Results for orders placed during the hospital encounter of 02/08/13 (from the past 48 hour(s))  GLUCOSE, CAPILLARY     Status: Abnormal   Collection Time    02/10/13 12:13 PM      Result Value Range   Glucose-Capillary 178 (*) 70 - 99 mg/dL  GLUCOSE, CAPILLARY     Status: Abnormal   Collection Time    02/10/13  5:32 PM      Result Value Range   Glucose-Capillary 193 (*) 70 - 99  mg/dL  GLUCOSE, CAPILLARY     Status: Abnormal   Collection Time    02/10/13  9:37 PM      Result Value Range   Glucose-Capillary 163 (*) 70 - 99 mg/dL   Comment 1 Notify RN     Comment 2 Documented in Chart    BASIC METABOLIC PANEL     Status: Abnormal   Collection Time    02/11/13  5:45 AM  Result Value Range   Sodium 135  135 - 145 mEq/L   Potassium 4.0  3.5 - 5.1 mEq/L   Chloride 93 (*) 96 - 112 mEq/L   CO2 34 (*) 19 - 32 mEq/L   Glucose, Bld 133 (*) 70 - 99 mg/dL   BUN 57 (*) 6 - 23 mg/dL   Creatinine, Ser 4.09 (*) 0.50 - 1.10 mg/dL   Calcium 9.5  8.4 - 81.1 mg/dL   GFR calc non Af Amer 19 (*) >90 mL/min   GFR calc Af Amer 22 (*) >90 mL/min   Comment:            The eGFR has been calculated     using the CKD EPI equation.     This calculation has not been     validated in all clinical     situations.     eGFR's persistently     <90 mL/min signify     possible Chronic Kidney Disease.  GLUCOSE, CAPILLARY     Status: Abnormal   Collection Time    02/11/13  7:44 AM      Result Value Range   Glucose-Capillary 126 (*) 70 - 99 mg/dL   Comment 1 Documented in Chart     Comment 2 Notify RN    PROTIME-INR     Status: Abnormal   Collection Time    02/11/13 11:25 AM      Result Value Range   Prothrombin Time 26.5 (*) 11.6 - 15.2 seconds   INR 2.59 (*) 0.00 - 1.49  GLUCOSE, CAPILLARY     Status: Abnormal   Collection Time    02/11/13 12:21 PM      Result Value Range   Glucose-Capillary 260 (*) 70 - 99 mg/dL   Comment 1 Documented in Chart     Comment 2 Notify RN    GLUCOSE, CAPILLARY     Status: Abnormal   Collection Time    02/11/13  4:43 PM      Result Value Range   Glucose-Capillary 205 (*) 70 - 99 mg/dL   Comment 1 Documented in Chart     Comment 2 Notify RN    GLUCOSE, CAPILLARY     Status: Abnormal   Collection Time    02/11/13 10:07 PM      Result Value Range   Glucose-Capillary 198 (*) 70 - 99 mg/dL   Comment 1 Documented in Chart     Comment 2  Notify RN    GLUCOSE, CAPILLARY     Status: Abnormal   Collection Time    02/12/13  7:41 AM      Result Value Range   Glucose-Capillary 128 (*) 70 - 99 mg/dL  PROTIME-INR     Status: Abnormal   Collection Time    02/12/13  7:58 AM      Result Value Range   Prothrombin Time 25.1 (*) 11.6 - 15.2 seconds   INR 2.41 (*) 0.00 - 1.49  BASIC METABOLIC PANEL     Status: Abnormal   Collection Time    02/12/13  7:58 AM      Result Value Range   Sodium 136  135 - 145 mEq/L   Potassium 3.7  3.5 - 5.1 mEq/L   Chloride 89 (*) 96 - 112 mEq/L   CO2 40 (*) 19 - 32 mEq/L   Comment: CRITICAL RESULT CALLED TO, READ BACK BY AND VERIFIED WITH:     R.ZELLNER,RN 02/12/13 0850 CLARK,S   Glucose, Bld  123 (*) 70 - 99 mg/dL   BUN 54 (*) 6 - 23 mg/dL   Creatinine, Ser 4.78 (*) 0.50 - 1.10 mg/dL   Calcium 29.5  8.4 - 62.1 mg/dL   GFR calc non Af Amer 22 (*) >90 mL/min   GFR calc Af Amer 25 (*) >90 mL/min   Comment:            The eGFR has been calculated     using the CKD EPI equation.     This calculation has not been     validated in all clinical     situations.     eGFR's persistently     <90 mL/min signify     possible Chronic Kidney Disease.      Component Value Date/Time   SDES URINE, CATHETERIZED 02/09/2013 0033   SPECREQUEST NONE 02/09/2013 0033   CULT GRAM NEGATIVE RODS 02/09/2013 0033   REPTSTATUS PENDING 02/09/2013 0033   No results found.   Recent Results (from the past 720 hour(s))  URINE CULTURE     Status: None   Collection Time    01/16/13  2:30 AM      Result Value Range Status   Specimen Description URINE, CATHETERIZED   Final   Special Requests CX ADDED AT 0324 ON [id number]   Final   Culture  Setup Time 01/16/2013 11:18   Final   Colony Count >=100,000 COLONIES/ML   Final   Culture     Final   Value: KLEBSIELLA PNEUMONIAE     Note: COLISTINE 0.125 ug/ml ETEST results for this drug are "FOR INVESTIGATIONAL USE ONLY" and should NOT be used for clinical purposes. THIS ISOLATE  PREVIOUSLY CONFIRMED AS [facility] CARBAPENEMASE PRODUCER CRITICAL RESULT CALLED TO, READ BACK BY AND VERIFIED      WITH: TIMED [email or web address]  ON [id number] BY [facility] CALLED INFECTION CONTROL PEGGY DANIELLA [id number] BY [facility]   Report Status 01/21/2013 FINAL   Final   Organism ID, Bacteria KLEBSIELLA PNEUMONIAE   Final  CLOSTRIDIUM DIFFICILE BY PCR     Status: Abnormal   Collection Time    01/16/13  3:35 AM      Result Value Range Status   C difficile by pcr POSITIVE (*) NEGATIVE Final   Comment: CRITICAL RESULT CALLED TO, READ BACK BY AND VERIFIED WITH:     HANEY R.,RN 01/16/13 0842 BY JONESJ  URINE CULTURE     Status: None   Collection Time    02/09/13 12:33 AM      Result Value Range Status   Specimen Description URINE, CATHETERIZED   Final   Special Requests NONE   Final   Culture  Setup Time 02/09/2013 01:24   Final   Colony Count >=100,000 COLONIES/ML   Final   Culture GRAM NEGATIVE RODS   Final   Report Status PENDING   Incomplete  MRSA PCR SCREENING     Status: None   Collection Time    02/09/13  2:23 AM      Result Value Range Status   MRSA by PCR NEGATIVE  NEGATIVE Final   Comment:            The GeneXpert MRSA Assay (FDA     approved for NASAL specimens     only), is one component of a     comprehensive MRSA colonization     surveillance program. It is not     intended to diagnose MRSA     infection nor to guide or  monitor treatment for     MRSA infections.     Impression/Recommendation  79 year old with multiple medical problems, as was CRE and urine:  #1 CRE colonization of urine: No obvious clinical evidence of urinary tract infection. -discontinue Foley catheter --Avoid unnecessary antibiotics --Should she have symptoms consistent with a urinary tract infection and wanted to target the Klebsiella that is resistant to carbapenem's and all antibiotics that we have conventionally I would give her a dose of fosfomycin 3 g x1 and then repeated in 3 days. Other less  desirable options would be tigecycline which has poor penetration into the urine and ultimately Colestin which would be active against this organism but would undoubtedly push this patient's renal function over the edge and I'm afraid would condemn her to hemodialysis.   #2 Hx of C. difficile colitis: --Another reason to avoid Ussery antibiotics. --Discontinue her proton pump inhibitors it is not absolutely necessary.  Thank you so much for this interesting consult. Please call with further questions.  [facility] [phone] (pager) [phone] (office) 02/12/2013, 11:19 AM  Paulette Blanch Dam 02/12/2013, 11:19 AM

## 2013-02-12 NOTE — Progress Notes (Signed)
ANTICOAGULATION CONSULT NOTE -follow up  Pharmacy Consult for Coumadin  Indication: atrial fibrillation  Allergies  Allergen Reactions  . Morphine Sulfate Other (See Comments)    REACTION: change in personality  . Remeron (Mirtazapine) Other (See Comments)    Altered mental status, lethargy  . Tuna (Fish Allergy) Other (See Comments)    unknown  . Penicillins Rash    Tolerates Ancef.    Patient Measurements: Height: 5' 3.6" (161.5 cm) Weight: 214 lb 11.7 oz (97.4 kg) IBW/kg (Calculated) : 53.78  Vital Signs: Temp: 98.7 F (37.1 C) (04/11 0802) Temp src: Oral (04/11 0802) BP: 151/91 mmHg (04/11 0802) Pulse Rate: 107 (04/11 0802)  Labs:  Recent Labs  02/10/13 0510 02/11/13 0545 02/11/13 1125 02/12/13 0758  LABPROT 30.1*  --  26.5* 25.1*  INR 3.07*  --  2.59* 2.41*  CREATININE  --  2.29*  --  2.01*    Estimated Creatinine Clearance: 23.8 ml/min (by C-G formula based on Cr of 2.01).    Assessment: 79 yo female with h/o atrial fibrillation resumed on coumadin. INR (2.41) at goal.  No bleeding reported. On amio PTA, resumed.   Home Coumadin regimen of 3mg  daily.  Goal of Therapy:  INR 2-3 Monitor platelets by anticoagulation protocol: Yes   Plan:  1. Coumadin 3mg  po daily 2. Daily PT / INR  Bayard Hugger, PharmD, BCPS  Clinical Pharmacist  Pager: [phone]  02/12/2013 10:18 AM

## 2013-02-12 NOTE — Progress Notes (Signed)
02/12/13 2100  BiPAP/CPAP/SIPAP  BiPAP/CPAP/SIPAP Pt Type Adult  Mask Type Nasal mask  Mask Size Medium  Respiratory Rate 20 breaths/min  IPAP (Max 15)  EPAP (Min 6)  BiPAP/CPAP/SIPAP CPAP  Patient Home Equipment No  Auto Titrate Yes  Patient on CPAP Auto Mode with 3L bleed in.

## 2013-02-12 NOTE — Progress Notes (Signed)
o2 adjusted to 2L per order

## 2013-02-12 NOTE — Progress Notes (Signed)
Placed patient on CPAP with nasal mask and 3l bleed in and previous settings. After 45 minutes patient's daughter called and requested that I switched her to her home CPAP which I did. After a few minutes on her CPAP patient requested to be switch back to our CPAP, and to decrease the pressure. The CPAP was adjusted to an [employer], Max 14Cmh2o and Min 6Cmh2o. She wore the CPAP for about 1 hour and requested to be placed on Nasal Cannula.

## 2013-02-13 LAB — BASIC METABOLIC PANEL
BUN: 50 mg/dL — ABNORMAL HIGH (ref 6–23)
GFR calc Af Amer: 30 mL/min — ABNORMAL LOW (ref >90)
GFR calc non Af Amer: 26 mL/min — ABNORMAL LOW (ref >90)
Potassium: 3 mEq/L — ABNORMAL LOW (ref 3.5–5.1)
Sodium: 135 mEq/L (ref 135–145)

## 2013-02-13 LAB — PROTIME-INR
INR: 2.13 — ABNORMAL HIGH (ref 0.00–1.49)
Prothrombin Time: 22.9 seconds — ABNORMAL HIGH (ref 11.6–15.2)

## 2013-02-13 NOTE — Progress Notes (Signed)
02/13/13 Patient discharged to [facility] . Call made to facility to report.

## 2013-02-13 NOTE — Progress Notes (Signed)
PATIENT DETAILS Name: Gina Ashley Age: 79 y.o. Sex: female Date of Birth: November 19, 1926 Admit Date: 02/08/2013 Admitting Physician Cristal Ford, MD [id number], Lyda Perone, MD  Subjective: No major complaints overnight-slept well-daughter at bedside  Assessment/Plan: Principal Problem:   Weakness generalized - Suspect this is multifactorial with elements of chronic congestive heart failure, renal failure, generalized deconditioning and possibly UTI  - Will need continued physical therapy while at SNF  Active Problems: Complicated UTI - Remains afebrile and without leukocytosis since admission -  urine cultures positive for Carbapenamase inhibitor K. Pneumoniae-likely a colonization rather than a true infection-spoke with Dr Zenaida Niece Dam-who suggested that we not treat this infection-has had similar culture results in March. -Foley catheter has been removed   Mild acute on chronic diastolic heart failure - Appreciate cardiology input-   Continue with Coreg, current dosing of Metalozone and Demadex  Chronic kidney disease stage 4 - Secondary to cardiorenal syndrome - Review of prior notes from his previous admission-patient is not a candidate for hemodialysis - Creatinine close to her usual baseline-but now slowly downtrending  Diabetes - CBGs stable - Continue with SSI and 5 units of Lantus  Atrial fibrillation - Rate controlled-on amiodarone and Coreg - Coumadin per pharmacy   anemia - Suspect secondary to chronic kidney disease - Monitor H&H periodically    OBSTRUCTIVE SLEEP APNEA - Patient is in the process of getting an appointment with the sleep Center for outpatient sleep study - Case management is working with the family to see if we can get a earlier appointment  Hypertension - Controlled with Coreg and BiDil  COPD - Lungs currently clear - Nebulized albuterol as needed  Gout - Stable - Continue with allopurinol  Disposition: Remain inpatient-SNF today  DVT  Prophylaxis: Not needed as on therapeutic anticoagulation  Code Status:  DNR  Procedures:  None  CONSULTS:  cardiology  PHYSICAL EXAM: Vital signs in last 24 hours: Filed Vitals:   02/12/13 1100 02/12/13 1736 02/12/13 2137 02/13/13 0630  BP: 144/85 140/76 125/78 124/65  Pulse: 110 97 92 84  Temp: 98.9 F (37.2 C)  98.2 F (36.8 C) 98.4 F (36.9 C)  TempSrc: Oral   Oral  Resp: 19  18 20   Height:      Weight:      SpO2: 99% 100% 98% 95%    Weight change:  Body mass index is 37.34 kg/(m^2).   Gen Exam: Awake and alert with clear speech.   Neck: Supple, No JVD.   Chest: B/L Clear except Few bibasilar rales CVS: S1 S2 Regular, no murmurs.  Abdomen: soft, BS +, non tender, non distended.  Extremities: trace edema, lower extremities warm to touch. Neurologic: Non Focal.   Skin: No Rash.   Wounds: N/A.   Intake/Output from previous day:  Intake/Output Summary (Last 24 hours) at 02/13/13 0949 Last data filed at 02/13/13 0631  Gross per 24 hour  Intake    718 ml  Output   3460 ml  Net  -2742 ml     LAB RESULTS: CBC  Recent Labs Lab 02/08/13 2139 02/09/13 0600  WBC 6.0 5.7  HGB 10.0* 9.1*  HCT 31.2* 28.8*  PLT 129* 122*  MCV 85.0 85.2  MCH 27.2 26.9  MCHC 32.1 31.6  RDW 16.5* 16.6*  LYMPHSABS 1.0  --   MONOABS 0.3  --   EOSABS 0.1  --   BASOSABS 0.0  --     Chemistries   Recent Labs Lab 02/08/13 2139 02/08/13 2251  02/09/13 0600 02/11/13 0545 02/12/13 0758  NA 136  --  137 135 136  K 5.3* 4.8 4.5 4.0 3.7  CL 94*  --  95* 93* 89*  CO2 36*  --  35* 34* 40*  GLUCOSE 226*  --  165* 133* 123*  BUN 57*  --  56* 57* 54*  CREATININE 2.83*  --  2.74* 2.29* 2.01*  CALCIUM 10.0  --  9.8 9.5 10.1    CBG:  Recent Labs Lab 02/12/13 0741 02/12/13 1141 02/12/13 1703 02/12/13 2132 02/13/13 0806  GLUCAP 128* 132* 219* 219* 98    GFR Estimated Creatinine Clearance: 23.8 ml/min (by C-G formula based on Cr of 2.01).  Coagulation  profile  Recent Labs Lab 02/08/13 2252 02/10/13 0510 02/11/13 1125 02/12/13 0758  INR 2.43* 3.07* 2.59* 2.41*    Cardiac Enzymes  Recent Labs Lab 02/09/13 0039 02/09/13 0600  TROPONINI <0.30 <0.30    No components found with this basename: POCBNP,  No results found for this basename: DDIMER,  in the last 72 hours No results found for this basename: HGBA1C,  in the last 72 hours No results found for this basename: CHOL, HDL, LDLCALC, TRIG, CHOLHDL, LDLDIRECT,  in the last 72 hours No results found for this basename: TSH, T4TOTAL, FREET3, T3FREE, THYROIDAB,  in the last 72 hours No results found for this basename: VITAMINB12, FOLATE, FERRITIN, TIBC, IRON, RETICCTPCT,  in the last 72 hours No results found for this basename: LIPASE, AMYLASE,  in the last 72 hours  Urine Studies No results found for this basename: UACOL, UAPR, USPG, UPH, UTP, UGL, UKET, UBIL, UHGB, UNIT, UROB, ULEU, UEPI, UWBC, URBC, UBAC, CAST, CRYS, UCOM, BILUA,  in the last 72 hours  MICROBIOLOGY: Recent Results (from the past 240 hour(s))  URINE CULTURE     Status: None   Collection Time    02/09/13 12:33 AM      Result Value Range Status   Specimen Description URINE, CATHETERIZED   Final   Special Requests NONE   Final   Culture  Setup Time 02/09/2013 01:24   Final   Colony Count >=100,000 COLONIES/ML   Final   Culture     Final   Value: KLEBSIELLA PNEUMONIAE     Note: THIS ISOLATE HAS BEEN CONFIRMED AS A  [facility] CARBAPENEMASE PRODUCER ETEST results for this drug are "FOR INVESTIGATIONAL USE ONLY" and should NOT be used for clinical purposes. COLISTIN 0.125 CRITICAL RESULT CALLED TO, READ BACK BY AND VERIFIED WITH:      KIM HELSBECK @ 2216 ON 02/12/2013   Report Status 02/12/2013 FINAL   Final   Organism ID, Bacteria KLEBSIELLA PNEUMONIAE   Final  MRSA PCR SCREENING     Status: None   Collection Time    02/09/13  2:23 AM      Result Value Range Status   MRSA by PCR NEGATIVE  NEGATIVE Final   Comment:             The GeneXpert MRSA Assay (FDA     approved for NASAL specimens     only), is one component of a     comprehensive MRSA colonization     surveillance program. It is not     intended to diagnose MRSA     infection nor to guide or     monitor treatment for     MRSA infections.    RADIOLOGY STUDIES/RESULTS: Dg Chest 2 View  02/08/2013  *RADIOLOGY REPORT*  Clinical Data: Shortness  of breath; shaky, nervous and weak.  CHEST - 2 VIEW  Comparison: Chest radiograph performed 01/06/2013  Findings: The lungs are well-aerated.  There is a persistent small to moderate right-sided pleural effusion, with patchy right basilar airspace opacification.  Underlying vascular congestion is seen. No pneumothorax identified.  The heart is mildly enlarged; calcification is noted in the aortic arch.  No acute osseous abnormalities are seen.  IMPRESSION:  1.  Persistent small to moderate right-sided pleural effusion, with patchy right basilar airspace opacification.  This may reflect pulmonary edema or possibly pneumonia. 2.  Underlying vascular congestion and mild cardiomegaly noted.   Original Report Authenticated By: Tonia Ghent, M.D.    Dg Knee 1-2 Views Left  01/20/2013  *RADIOLOGY REPORT*  Clinical Data: Left knee pain and swelling.  History of old trauma.  LEFT KNEE - 1-2 VIEW  Comparison: CT 12/01/2012.  Fluoroscopy 11/30/2012.  Left tibia and fibula 11/21/2012.  Left tibia and fibula 08/27/2012.  Findings: There are postoperative changes with left total knee arthroplasty and patellofemoral component.  There are pin tracks in the distal femoral and proximal tibial shaft.  There is a displaced fracture of the proximal left tibial metaphysis which has been present previously and is now demonstrating a healing with sclerosis in the fracture line and loss of distinction of the fracture line.  There is residual lateral displacement angulation of the distal fracture fragments.  Alignment and position is similar  to previous studies.  There is no significant effusion.  No acute fractures are demonstrated.  IMPRESSION: Old postoperative and post-traumatic changes in the left knee as described.  There is an old healing fracture of the tibial metaphysis with line in position similar to previous study.  Left knee arthroplasty.  Pin tracks in the tibia and fibula.  No acute fractures are suggested.   Original Report Authenticated By: Burman Nieves, M.D.     MEDICATIONS: Scheduled Meds: . allopurinol  100 mg Oral Daily  . amiodarone  400 mg Oral Daily  . antiseptic oral rinse  15 mL Mouth Rinse BID  . busPIRone  5 mg Oral Q12H  . carvedilol  25 mg Oral BID WC  . docusate sodium  100 mg Oral Q8H  . feeding supplement  237 mL Oral Q24H  . guaiFENesin  600 mg Oral BID  . insulin aspart  0-5 Units Subcutaneous QHS  . insulin aspart  0-9 Units Subcutaneous TID WC  . insulin glargine  5 Units Subcutaneous QHS  . isosorbide-hydrALAZINE  1 tablet Oral BID  . metolazone  5 mg Oral Daily  . multivitamin with minerals  1 tablet Oral Daily  . oxyCODONE  5 mg Oral Once  . OxyCODONE  10 mg Oral Q12H  . pantoprazole  40 mg Oral Daily  . polyethylene glycol  17 g Oral BID  . saccharomyces boulardii  250 mg Oral BID  . sodium chloride  3 mL Intravenous Q12H  . torsemide  60 mg Oral Daily  . traZODone  50 mg Oral QHS  . Warfarin - Pharmacist Dosing Inpatient   Does not apply q1800   Continuous Infusions:  PRN Meds:.acetaminophen, acetaminophen, albuterol, diphenhydrAMINE, gi cocktail, nitroGLYCERIN, ondansetron (ZOFRAN) IV, ondansetron, oxyCODONE, simethicone  Antibiotics: Anti-infectives   Start     Dose/Rate Route Frequency Ordered Stop   02/09/13 2200  cefTRIAXone (ROCEPHIN) 1 g in dextrose 5 % 50 mL IVPB     1 g 100 mL/hr over 30 Minutes Intravenous Every 24 hours 02/09/13 0258 02/10/13  2322   02/09/13 0030  cefTRIAXone (ROCEPHIN) 1 g in dextrose 5 % 50 mL IVPB     1 g 100 mL/hr over 30 Minutes  Intravenous  Once 02/09/13 0016 02/09/13 Leroy Libman, MD  [facility] Pager:[phone]  If 7PM-7AM, please contact night-coverage [email or web address] Password [facility] 02/13/2013, 9:49 AM   LOS: 5 days

## 2013-02-13 NOTE — Clinical Social Work Note (Signed)
Patient to return to [facility] today via [facility]. Patient, daughter, and facility aware of transfer. Discharge packet complete. CSW signing off.  Ricke Hey, [place] [phone] (weekend)

## 2013-02-13 NOTE — ED Provider Notes (Signed)
Medical screening examination/treatment/procedure(s) were performed by non-physician practitioner and as supervising physician I was immediately available for consultation/collaboration.  Sunnie Nielsen, MD 02/13/13 0002

## 2013-02-15 ENCOUNTER — Other Ambulatory Visit: Payer: Self-pay | Admitting: *Deleted

## 2013-02-15 MED ORDER — OXYCODONE HCL 5 MG PO TABS: ORAL_TABLET | ORAL | Status: DC

## 2013-02-15 MED ORDER — ALPRAZOLAM 0.25 MG PO TABS: ORAL_TABLET | ORAL | Status: DC

## 2013-02-17 ENCOUNTER — Non-Acute Institutional Stay (SKILLED_NURSING_FACILITY): Payer: Medicare Other | Admitting: Internal Medicine

## 2013-02-17 ENCOUNTER — Encounter: Payer: Self-pay | Admitting: Internal Medicine

## 2013-02-17 DIAGNOSIS — I5032 Chronic diastolic (congestive) heart failure: Secondary | ICD-10-CM

## 2013-02-17 DIAGNOSIS — I482 Chronic atrial fibrillation, unspecified: Secondary | ICD-10-CM

## 2013-02-17 DIAGNOSIS — N184 Chronic kidney disease, stage 4 (severe): Secondary | ICD-10-CM

## 2013-02-17 DIAGNOSIS — R5383 Other fatigue: Secondary | ICD-10-CM

## 2013-02-17 DIAGNOSIS — I4891 Unspecified atrial fibrillation: Secondary | ICD-10-CM

## 2013-02-17 DIAGNOSIS — R531 Weakness: Secondary | ICD-10-CM

## 2013-02-17 NOTE — Progress Notes (Signed)
Patient ID: Gina Ashley, female   DOB: June 16, 1927, 79 y.o.   MRN: [id number]     Gina Ashley WGN:[id number] DOB: 03-Oct-1927 DOA: (Not on file)   PCP: Ernestine Conrad, MD   Chief Complaint: Post hospitalization follow up.  HPI:  79 y/o female with hx of HTN, DM ( insulin dependent), diastolic CHF, chronic afib/ a flutter  on coumadin, chronic back pain, chronic recurrent  UTI, OSA on CPAP, CKD, right femur ad left tibial fracture in 10/13  s/p surgery  and chronic diastolic CHF who was admitted to [facility] for generalized weakness which was a combination of her underlying medical problems including CHF, CKD, possible UTI and  deconditioning. She was diuresed adequately and symptoms started to improved. Her UA was positive for GNR which was thought more likely of chronic colonization. She did not have any fever or leucocytosis. ID was consulted who recommended against further treatment. Patient also had acute on chronic diastolic CHF for which she was diuresed with extra dose of diuretic. She has stage IV CKD so not opted for aggressive diuresis as she was thought not to be a candidate for HD. She recently had removal of left tibial external fixator and given concern for poor union  was recommended for NWB over left leg for few more weeks with outpatient ortho follow up.  Patient discharged to SNF requiring active PT at least 3 times a week . On evaluation today, she denies any headache, dizziness, blurry vision, fever, chills, chest pain, palpitations, SOB, nausea, vomiting, abdominal pain, bowel or urinary symptoms.   Review of Systems:  Constitutional: Denies fever, chills, diaphoresis, appetite change and fatigue.  HEENT: Denies photophobia, eye pain, redness, hearing loss, ear pain, congestion, sore throat, rhinorrhea, sneezing, mouth sores, trouble swallowing, neck pain, neck stiffness and tinnitus.   Respiratory: Denies SOB, DOE, cough, chest tightness,  and wheezing.    Cardiovascular: Denies chest pain, palpitations and leg swelling.  Gastrointestinal: Denies nausea, vomiting, abdominal pain, diarrhea, constipation, blood in stool and abdominal distention.  Genitourinary: Denies dysuria, urgency, frequency, hematuria, flank pain and difficulty urinating.  Musculoskeletal: has chronic back pain, Denies myalgias,joint swelling, arthralgias , has  gait problem.  Skin: Denies pallor, rash and wound.  Neurological: Denies dizziness, seizures, syncope, weakness, light-headedness, numbness and headaches.  Hematological: Denies adenopathy. Has Easy bruising,  Psychiatric/Behavioral: Denies suicidal ideation, mood changes, confusion, nervousness, sleep disturbance and agitation   Past Medical History  Diagnosis Date  . GERD (gastroesophageal reflux disease)   . Back pain, chronic   . Pain in shoulder   . Pain in ankle   . Hiatal hernia   . HTN (hypertension)     all her life  . CKD (chronic kidney disease), stage III     GFR: 38  . Atrial fibrillation     since 1996  . Sleep apnea   . Fall 3 weeks ago    was evaluated at [facility]  . OSA on CPAP   . Depression   . PONV (postoperative nausea and vomiting)     difficult to wake up  . Chronic diastolic heart failure 08/31/2012  . Tibia fracture     BILATERAL  . Renal failure     acute on chronic   . Fibula fracture     bilateral  . Respiratory failure     acute on chronic hx of  requiring BIPAP  . COPD (chronic obstructive pulmonary disease)   . Metabolic encephalopathy     hx  of   . Renal failure     acute on chronic with need for intermittent dialysis - hx of   . UTI (urinary tract infection)     hx of   . Pleural effusion     right sided now resolved   . Diabetes mellitus     x 15 yrs, hx of hypoglycemia   . CHF (congestive heart failure)     diastolic HF   Past Surgical History  Procedure Laterality Date  . Bunionectomy      bilateral  . Cholecystectomy    . Rotator cuff repair       2002  . Total knee arthroplasty      bilateral 2001  . Hammer toe surgery      2004  . Colonoscopy  07/11/2011    Procedure: COLONOSCOPY;  Surgeon: Malissa Hippo, MD;  Location: [facility];  Service: Endoscopy;  Laterality: N/A;  3:00  . Orif periprosthetic fracture  09/08/2012    Procedure: OPEN REDUCTION INTERNAL FIXATION (ORIF) PERIPROSTHETIC FRACTURE;  Surgeon: Shelda Pal, MD;  Location: [facility] ORS;  Service: Orthopedics;  Laterality: Right;  ORIF periprosthetic right proximal femur fracture Spanning external fixator left distal tibia  . External fixation leg  09/08/2012    Procedure: EXTERNAL FIXATION LEG;  Surgeon: Shelda Pal, MD;  Location: [facility] ORS;  Service: Orthopedics;  Laterality: Left;  . Joint replacement      bilateral knees   . External fixation leg  11/30/2012    Procedure: EXTERNAL FIXATION LEG;  Surgeon: Shelda Pal, MD;  Location: [facility] ORS;  Service: Orthopedics;  Laterality: Left;  Removal of External Fixation Left Knee with Evaluation with Floroscopy  . Tee without cardioversion N/A 01/19/2013    Procedure: TRANSESOPHAGEAL ECHOCARDIOGRAM (TEE);  Surgeon: Lewayne Bunting, MD;  Location: [facility] ENDOSCOPY;  Service: Cardiovascular;  Laterality: N/A;   Social History:  reports that she has never smoked. She has never used smokeless tobacco. She reports that she does not drink alcohol or use illicit drugs.  Allergies  Allergen Reactions  . Morphine Sulfate Other (See Comments)    REACTION: change in personality  . Remeron (Mirtazapine) Other (See Comments)    Altered mental status, lethargy  . Tuna (Fish Allergy) Other (See Comments)    unknown  . Penicillins Rash    Tolerates Ancef.    Family History  Problem Relation Age of Onset  . Colon cancer Brother     Prior to Admission medications   Medication Sig Start Date End Date Taking? Authorizing Provider  albuterol (PROVENTIL) (2.5 MG/3ML) 0.083% nebulizer solution Take 3 mLs (2.5 mg total) by  nebulization every 2 (two) hours as needed for wheezing or shortness of breath. 02/12/13   Shanker Levora Dredge, MD  albuterol (PROVENTIL) (5 MG/ML) 0.5% nebulizer solution Take 0.5 mLs (2.5 mg total) by nebulization 3 (three) times daily. 02/12/13   Shanker Levora Dredge, MD  allopurinol (ZYLOPRIM) 100 MG tablet Take 100 mg by mouth daily.    Historical Provider, MD  ALPRAZolam Prudy Feeler) 0.25 MG tablet Take one tablet every 8 hours as needed for anxiety 02/15/13   Tiffany L Reed, DO  Alum & Mag Hydroxide-Simeth (GI COCKTAIL) SUSP suspension Take 30 mLs by mouth 3 (three) times daily as needed for indigestion. Shake well. 02/12/13   Shanker Levora Dredge, MD  amiodarone (PACERONE) 200 MG tablet Take 2 tablets (400 mg total) by mouth daily. 01/22/13   Erick Blinks, MD  busPIRone (BUSPAR) 5  MG tablet Take 5 mg by mouth every 12 (twelve) hours.    Historical Provider, MD  carvedilol (COREG) 25 MG tablet Take 25 mg by mouth 2 (two) times daily with a meal.    Historical Provider, MD  diphenhydrAMINE (BENADRYL) 25 mg capsule Take 25 mg by mouth every 8 (eight) hours as needed for itching.    Historical Provider, MD  docusate sodium (COLACE) 100 MG capsule Take 100 mg by mouth every 8 (eight) hours.     Historical Provider, MD  feeding supplement (GLUCERNA SHAKE) LIQD Take 237 mLs by mouth daily. 02/12/13   Shanker Levora Dredge, MD  guaiFENesin (MUCINEX) 600 MG 12 hr tablet Take 600 mg by mouth 2 (two) times daily.     Historical Provider, MD  insulin aspart (NOVOLOG) 100 UNIT/ML injection Inject 1-5 Units into the skin 3 (three) times daily with meals. 150-200=1unit,201-250=2units,251-300=3units,301-350=4units,351-400=5units if over 400 call MD 09/15/12   Gwenyth Bender, NP  insulin glargine (LANTUS) 100 UNIT/ML injection Inject 5 Units into the skin at bedtime.    Historical Provider, MD  isosorbide-hydrALAZINE (BIDIL) 20-37.5 MG per tablet Take 1 tablet by mouth 2 (two) times daily. 01/22/13   Erick Blinks, MD  metolazone  (ZAROXOLYN) 5 MG tablet Take 1 tablet (5 mg total) by mouth daily. 02/12/13   Shanker Levora Dredge, MD  Multiple Vitamin (MULTIVITAMIN WITH MINERALS) TABS Take 1 tablet by mouth daily.    Historical Provider, MD  nitroGLYCERIN (NITROSTAT) 0.4 MG SL tablet Place 0.4 mg under the tongue every 5 (five) minutes as needed. Chest pain    Historical Provider, MD  omeprazole (PRILOSEC) 20 MG capsule Take 40 mg by mouth daily.    Historical Provider, MD  ondansetron (ZOFRAN) 4 MG tablet Take 4 mg by mouth every 6 (six) hours as needed for nausea.    Historical Provider, MD  ondansetron (ZOFRAN) 4 MG tablet Take 1 tablet (4 mg total) by mouth every 6 (six) hours as needed for nausea. 02/12/13   Shanker Levora Dredge, MD  oxyCODONE (OXY IR/ROXICODONE) 5 MG immediate release tablet Take one tablet every 4 hours as needed for pain 02/15/13   Claudie Revering, NP  oxyCODONE (OXYCONTIN) 10 MG 12 hr tablet Take 1 tablet (10 mg total) by mouth every 12 (twelve) hours. 02/12/13   Shanker Levora Dredge, MD  polyethylene glycol (MIRALAX / GLYCOLAX) packet Take 17 g by mouth 2 (two) times daily.     Historical Provider, MD  saccharomyces boulardii (FLORASTOR) 250 MG capsule Take 1 capsule (250 mg total) by mouth 2 (two) times daily. 02/12/13   Shanker Levora Dredge, MD  simethicone (MYLICON) 80 MG chewable tablet Chew 80 mg by mouth every 6 (six) hours as needed for flatulence.    Historical Provider, MD  torsemide (DEMADEX) 20 MG tablet Take 3 tablets (60 mg total) by mouth daily. 01/22/13   Erick Blinks, MD  traZODone (DESYREL) 50 MG tablet Take 50 mg by mouth at bedtime.    Historical Provider, MD  warfarin (COUMADIN) 3 MG tablet Take 3 mg by mouth daily.    Historical Provider, MD    Physical Exam:  Filed Vitals:   02/17/13 1100  BP: 156/96  Pulse: 100  Temp: 97.4 F (36.3 C)  Resp: 18  SpO2: 90%    Constitutional: Vital signs reviewed.  Patient is an elderly obese female in NAD  Head: Normocephalic and atraumatic Mouth:  no erythema or exudates, MMM Eyes: PERRL, EOMI, conjunctivae normal, No scleral icterus.  Neck: Supple, Trachea midline normal ROM, No JVD, mass, thyromegaly, or carotid bruit present.  Cardiovascular:, S1, S2 irregular, no MRG, pulses symmetric and intact bilaterally Pulmonary/Chest: CTAB, no wheezes, rales, or rhonchi Abdominal: Soft. Non-tender, non-distended, bowel sounds are normal, no masses, organomegaly, or guarding present.  GU: no CVA tenderness Musculoskeletal: No joint deformities, erythema, or stiffness, ROM full and no nontender, has left knee brace. Ext: no edema and no cyanosis, pulses palpable bilaterally  Hematology: no cervical, inginal, or axillary adenopathy.  Neurological: A&O x3, Strenght is normal and symmetric bilaterally, cranial nerve II-XII are grossly intact, no focal motor deficit, sensory intact to light touch bilaterally.  Skin: Warm, dry and intact. No rash, cyanosis, or clubbing.  Psychiatric: Normal mood and affect. speech and behavior is normal. Judgment and thought content normal. Cognition and memory are normal.   Labs on Admission:  Basic Metabolic Panel:  Recent Labs Lab 02/11/13 0545 02/12/13 0758 02/13/13 0914  NA 135 136 135  K 4.0 3.7 3.0*  CL 93* 89* 87*  CO2 34* 40* 42*  GLUCOSE 133* 123* 155*  BUN 57* 54* 50*  CREATININE 2.29* 2.01* 1.76*  CALCIUM 9.5 10.1 10.1   CBG:  Recent Labs Lab 02/12/13 0741 02/12/13 1141 02/12/13 1703 02/12/13 2132 02/13/13 0806  GLUCAP 128* 132* 219* 219* 98       Assessment/Plan  Weakness Secondary to multiple issues including CHF, CKD, b/l leg fractures with non ambulatory state. She was diuresed adequately in the hospital and symptoms are much improved as per patient. Patient quite motivated to participate with PT.   Acute on chronic diastolic CHF Patient received extra dose of diuretic and symptoms improved. She was discharged on her home dose of Demadex and metolazone which will be  continued here . monitor weight closely.   Atrial fibrillation. Rate stable on amiodarone and coreg. Continue coumadin. INR therapeutic. Check levels as scheduled.  CKD stage IV As per hospital notes , she is not a candidate for HD and she has been evaluated by nephrology. Creatinine prior to discharge had improved to 1.76. Continue current dose fo diuretics. Renal function will be monitored here periodically.  HTN  BP stable. Continue current meds   Diabetes mellitus  fsg stable. Continue low dose lantus and sliding scale insulin. Last A1C in 3/14 of 6.8.   COPD  stable. Continue nebs  obstructive sleep apnea Patient working on getting outpatient appt for sleep study.  Concern for recurrent UTI She had a foley upon admission which was discontinued Urine cx growing klebsiella pneumoniae on multiple occasions without clinical signs or symptoms of infection suggestive of colonization. ID consulted evaluated patient in the hospital and recommended against using antibiotics without clinical evidence of UTI. Also given hx of cdiff , frequent abx based on culture alone  Is discouraged.  Right femur and left tibial fracture in 10/13 S/p repair. Patient follows with Dr Charlann Boxer and had removal of left tibial external fixator in jan 14. Given concern for poor union on repeat imaging, Dr Charlann Boxer recommended for NWB over left leg for few more weeks. She informs having an appt with him next week.    Rehab potential: good  Activities  of daily living Feeding independently  dressing independently Needs some assistance with toileting and bathing  dependently ambulating.  Code Status: DNR Family Communication: discussed with daughter  Future labs: INR as scheduled  Future visits: will be followed as needed. Has appt with ortho next week.  Counseling Counseled regarding diet and  medication adherence. Encouraged to participate in PT as tolerated.   Total time spent: 60 minutes  Kathlen Sakurai,  Noha Milberger  02/17/2013, 5:28 PM

## 2013-02-18 ENCOUNTER — Emergency Department (HOSPITAL_COMMUNITY)
Admission: EM | Admit: 2013-02-18 | Discharge: 2013-02-19 | Disposition: A | Discharge status: Skilled Nursing Facility | Payer: Medicare Other | Attending: Emergency Medicine | Admitting: Emergency Medicine

## 2013-02-18 ENCOUNTER — Encounter (HOSPITAL_COMMUNITY): Payer: Self-pay | Admitting: *Deleted

## 2013-02-18 ENCOUNTER — Emergency Department (HOSPITAL_COMMUNITY): Payer: Medicare Other

## 2013-02-18 DIAGNOSIS — Z79899 Other long term (current) drug therapy: Secondary | ICD-10-CM | POA: Insufficient documentation

## 2013-02-18 DIAGNOSIS — Z794 Long term (current) use of insulin: Secondary | ICD-10-CM | POA: Insufficient documentation

## 2013-02-18 DIAGNOSIS — J4489 Other specified chronic obstructive pulmonary disease: Secondary | ICD-10-CM | POA: Insufficient documentation

## 2013-02-18 DIAGNOSIS — K219 Gastro-esophageal reflux disease without esophagitis: Secondary | ICD-10-CM | POA: Insufficient documentation

## 2013-02-18 DIAGNOSIS — G8929 Other chronic pain: Secondary | ICD-10-CM | POA: Insufficient documentation

## 2013-02-18 DIAGNOSIS — I129 Hypertensive chronic kidney disease with stage 1 through stage 4 chronic kidney disease, or unspecified chronic kidney disease: Secondary | ICD-10-CM | POA: Insufficient documentation

## 2013-02-18 DIAGNOSIS — N183 Chronic kidney disease, stage 3 unspecified: Secondary | ICD-10-CM | POA: Insufficient documentation

## 2013-02-18 DIAGNOSIS — Z8709 Personal history of other diseases of the respiratory system: Secondary | ICD-10-CM | POA: Insufficient documentation

## 2013-02-18 DIAGNOSIS — Z8739 Personal history of other diseases of the musculoskeletal system and connective tissue: Secondary | ICD-10-CM | POA: Insufficient documentation

## 2013-02-18 DIAGNOSIS — Z9181 History of falling: Secondary | ICD-10-CM | POA: Insufficient documentation

## 2013-02-18 DIAGNOSIS — J9610 Chronic respiratory failure, unspecified whether with hypoxia or hypercapnia: Secondary | ICD-10-CM | POA: Insufficient documentation

## 2013-02-18 DIAGNOSIS — E119 Type 2 diabetes mellitus without complications: Secondary | ICD-10-CM | POA: Insufficient documentation

## 2013-02-18 DIAGNOSIS — J449 Chronic obstructive pulmonary disease, unspecified: Secondary | ICD-10-CM | POA: Insufficient documentation

## 2013-02-18 DIAGNOSIS — I509 Heart failure, unspecified: Secondary | ICD-10-CM | POA: Insufficient documentation

## 2013-02-18 DIAGNOSIS — Z87440 Personal history of urinary (tract) infections: Secondary | ICD-10-CM | POA: Insufficient documentation

## 2013-02-18 DIAGNOSIS — R5381 Other malaise: Secondary | ICD-10-CM | POA: Insufficient documentation

## 2013-02-18 DIAGNOSIS — R531 Weakness: Secondary | ICD-10-CM

## 2013-02-18 DIAGNOSIS — Z8669 Personal history of other diseases of the nervous system and sense organs: Secondary | ICD-10-CM | POA: Insufficient documentation

## 2013-02-18 DIAGNOSIS — I4891 Unspecified atrial fibrillation: Secondary | ICD-10-CM | POA: Insufficient documentation

## 2013-02-18 DIAGNOSIS — Z8719 Personal history of other diseases of the digestive system: Secondary | ICD-10-CM | POA: Insufficient documentation

## 2013-02-18 DIAGNOSIS — G4733 Obstructive sleep apnea (adult) (pediatric): Secondary | ICD-10-CM | POA: Insufficient documentation

## 2013-02-18 DIAGNOSIS — F3289 Other specified depressive episodes: Secondary | ICD-10-CM | POA: Insufficient documentation

## 2013-02-18 DIAGNOSIS — F329 Major depressive disorder, single episode, unspecified: Secondary | ICD-10-CM | POA: Insufficient documentation

## 2013-02-18 DIAGNOSIS — I5032 Chronic diastolic (congestive) heart failure: Secondary | ICD-10-CM | POA: Insufficient documentation

## 2013-02-18 LAB — BASIC METABOLIC PANEL
BUN: 61 mg/dL — ABNORMAL HIGH (ref 6–23)
Chloride: 82 mEq/L — ABNORMAL LOW (ref 96–112)
Glucose, Bld: 205 mg/dL — ABNORMAL HIGH (ref 70–99)
Potassium: 3 mEq/L — ABNORMAL LOW (ref 3.5–5.1)

## 2013-02-18 LAB — CBC WITH DIFFERENTIAL/PLATELET
Basophils Relative: 0 % (ref 0–1)
Eosinophils Absolute: 0.1 10*3/uL (ref 0.0–0.7)
HCT: 28.6 % — ABNORMAL LOW (ref 36.0–46.0)
Hemoglobin: 9.5 g/dL — ABNORMAL LOW (ref 12.0–15.0)
MCH: 27.5 pg (ref 26.0–34.0)
MCHC: 33.2 g/dL (ref 30.0–36.0)
MCV: 82.9 fL (ref 78.0–100.0)
Monocytes Absolute: 0.3 10*3/uL (ref 0.1–1.0)
Monocytes Relative: 5 % (ref 3–12)

## 2013-02-18 LAB — URINALYSIS, ROUTINE W REFLEX MICROSCOPIC
Bilirubin Urine: NEGATIVE
Hgb urine dipstick: NEGATIVE
Protein, ur: NEGATIVE mg/dL
Specific Gravity, Urine: 1.011 (ref 1.005–1.030)
Urobilinogen, UA: 0.2 mg/dL (ref 0.0–1.0)

## 2013-02-18 LAB — URINE MICROSCOPIC-ADD ON

## 2013-02-18 MED ORDER — POTASSIUM CHLORIDE CRYS ER 20 MEQ PO TBCR
40.0000 meq | EXTENDED_RELEASE_TABLET | Freq: Once | ORAL | Status: AC
  Administered 2013-02-18: 40 meq via ORAL
  Filled 2013-02-18: qty 1
  Filled 2013-02-18: qty 2

## 2013-02-18 NOTE — ED Provider Notes (Signed)
History     CSN: [id number]  Arrival date & time 02/18/13  4098   First MD Initiated Contact with Patient 02/18/13 2039      Chief Complaint  Patient presents with  . Weakness     Patient is a 79 y.o. female presenting with weakness. The history is provided by the patient and a relative.  Weakness This is a new problem. The current episode started yesterday. The problem occurs constantly. The problem has not changed since onset.Pertinent negatives include no chest pain, no abdominal pain, no headaches and no shortness of breath. Nothing aggravates the symptoms. Nothing relieves the symptoms.  pt presents from nursing facility for generalized fatigue No cp/sob.  No abd pain.  No fever/vomiting/diarrhea No focal weakness No new cough   Past Medical History  Diagnosis Date  . GERD (gastroesophageal reflux disease)   . Back pain, chronic   . Pain in shoulder   . Pain in ankle   . Hiatal hernia   . HTN (hypertension)     all her life  . CKD (chronic kidney disease), stage III     GFR: 38  . Atrial fibrillation     since 1996  . Sleep apnea   . Fall 3 weeks ago    was evaluated at [facility]  . OSA on CPAP   . Depression   . PONV (postoperative nausea and vomiting)     difficult to wake up  . Chronic diastolic heart failure 08/31/2012  . Tibia fracture     BILATERAL  . Renal failure     acute on chronic   . Fibula fracture     bilateral  . Respiratory failure     acute on chronic hx of  requiring BIPAP  . COPD (chronic obstructive pulmonary disease)   . Metabolic encephalopathy     hx of   . Renal failure     acute on chronic with need for intermittent dialysis - hx of   . UTI (urinary tract infection)     hx of   . Pleural effusion     right sided now resolved   . Diabetes mellitus     x 15 yrs, hx of hypoglycemia   . CHF (congestive heart failure)     diastolic HF    Past Surgical History  Procedure Laterality Date  . Bunionectomy      bilateral  .  Cholecystectomy    . Rotator cuff repair      2002  . Total knee arthroplasty      bilateral 2001  . Hammer toe surgery      2004  . Colonoscopy  07/11/2011    Procedure: COLONOSCOPY;  Surgeon: Malissa Hippo, MD;  Location: [facility];  Service: Endoscopy;  Laterality: N/A;  3:00  . Orif periprosthetic fracture  09/08/2012    Procedure: OPEN REDUCTION INTERNAL FIXATION (ORIF) PERIPROSTHETIC FRACTURE;  Surgeon: Shelda Pal, MD;  Location: [facility] ORS;  Service: Orthopedics;  Laterality: Right;  ORIF periprosthetic right proximal femur fracture Spanning external fixator left distal tibia  . External fixation leg  09/08/2012    Procedure: EXTERNAL FIXATION LEG;  Surgeon: Shelda Pal, MD;  Location: [facility];  Service: Orthopedics;  Laterality: Left;  . Joint replacement      bilateral knees   . External fixation leg  11/30/2012    Procedure: EXTERNAL FIXATION LEG;  Surgeon: Shelda Pal, MD;  Location: [facility];  Service: Orthopedics;  Laterality:  Left;  Removal of External Fixation Left Knee with Evaluation with Floroscopy  . Tee without cardioversion N/A 01/19/2013    Procedure: TRANSESOPHAGEAL ECHOCARDIOGRAM (TEE);  Surgeon: Lewayne Bunting, MD;  Location: [facility] ENDOSCOPY;  Service: Cardiovascular;  Laterality: N/A;    Family History  Problem Relation Age of Onset  . Colon cancer Brother     History  Substance Use Topics  . Smoking status: Never Smoker   . Smokeless tobacco: Never Used  . Alcohol Use: No    OB History   Grav Para Term Preterm Abortions TAB SAB Ect Mult Living                  Review of Systems  Constitutional: Positive for fatigue. Negative for fever.  Respiratory: Negative for shortness of breath.   Cardiovascular: Negative for chest pain.  Gastrointestinal: Negative for vomiting and abdominal pain.  Neurological: Positive for weakness. Negative for syncope and headaches.  All other systems reviewed and are negative.    Allergies  Morphine sulfate;  Remeron; Tuna; and Penicillins  Home Medications   Current Outpatient Rx  Name  Route  Sig  Dispense  Refill  . albuterol (PROVENTIL) (2.5 MG/3ML) 0.083% nebulizer solution   Nebulization   Take 3 mLs (2.5 mg total) by nebulization every 2 (two) hours as needed for wheezing or shortness of breath.   75 mL   12   . albuterol (PROVENTIL) (5 MG/ML) 0.5% nebulizer solution   Nebulization   Take 0.5 mLs (2.5 mg total) by nebulization 3 (three) times daily.   20 mL      . allopurinol (ZYLOPRIM) 100 MG tablet   Oral   Take 100 mg by mouth daily.         Marland Kitchen ALPRAZolam (XANAX) 0.25 MG tablet   Oral   Take 0.25 mg by mouth every 8 (eight) hours as needed for anxiety.         Marland Kitchen amiodarone (PACERONE) 200 MG tablet   Oral   Take 2 tablets (400 mg total) by mouth daily.         . busPIRone (BUSPAR) 5 MG tablet   Oral   Take 5 mg by mouth every 12 (twelve) hours.         . carvedilol (COREG) 25 MG tablet   Oral   Take 25 mg by mouth 2 (two) times daily with a meal.         . diphenhydrAMINE (BENADRYL) 25 mg capsule   Oral   Take 25 mg by mouth every 8 (eight) hours as needed for itching.         . docusate sodium (COLACE) 100 MG capsule   Oral   Take 100 mg by mouth every 8 (eight) hours.          Marland Kitchen guaiFENesin (MUCINEX) 600 MG 12 hr tablet   Oral   Take 600 mg by mouth 2 (two) times daily.          . insulin aspart (NOVOLOG) 100 UNIT/ML injection   Subcutaneous   Inject 1-5 Units into the skin 3 (three) times daily with meals. 150-200=1unit,201-250=2units,251-300=3units,301-350=4units,351-400=5units if over 400 call MD         . insulin glargine (LANTUS) 100 UNIT/ML injection   Subcutaneous   Inject 5 Units into the skin at bedtime.         . isosorbide-hydrALAZINE (BIDIL) 20-37.5 MG per tablet   Oral   Take 1 tablet by mouth 2 (two) times  daily.         . metolazone (ZAROXOLYN) 5 MG tablet   Oral   Take 1 tablet (5 mg total) by mouth daily.          . Multiple Vitamin (MULTIVITAMIN WITH MINERALS) TABS   Oral   Take 1 tablet by mouth daily.         Marland Kitchen omeprazole (PRILOSEC) 20 MG capsule   Oral   Take 40 mg by mouth daily.         . ondansetron (ZOFRAN) 4 MG tablet   Oral   Take 1 tablet (4 mg total) by mouth every 6 (six) hours as needed for nausea.   20 tablet      . oxyCODONE (OXY IR/ROXICODONE) 5 MG immediate release tablet   Oral   Take 5 mg by mouth every 4 (four) hours as needed for pain.         Marland Kitchen oxyCODONE (OXYCONTIN) 10 MG 12 hr tablet   Oral   Take 1 tablet (10 mg total) by mouth every 12 (twelve) hours.   20 tablet   0   . polyethylene glycol (MIRALAX / GLYCOLAX) packet   Oral   Take 17 g by mouth 2 (two) times daily.          Marland Kitchen saccharomyces boulardii (FLORASTOR) 250 MG capsule   Oral   Take 1 capsule (250 mg total) by mouth 2 (two) times daily.         . simethicone (MYLICON) 80 MG chewable tablet   Oral   Chew 80 mg by mouth every 6 (six) hours as needed for flatulence.         . torsemide (DEMADEX) 20 MG tablet   Oral   Take 3 tablets (60 mg total) by mouth daily.         . traZODone (DESYREL) 50 MG tablet   Oral   Take 50 mg by mouth at bedtime.         Marland Kitchen warfarin (COUMADIN) 3 MG tablet   Oral   Take 3 mg by mouth daily.         . nitroGLYCERIN (NITROSTAT) 0.4 MG SL tablet   Sublingual   Place 0.4 mg under the tongue every 5 (five) minutes as needed. Chest pain           BP 109/56  Pulse 89  Temp(Src) 98.4 F (36.9 C) (Oral)  Resp 14  SpO2 98%  Physical Exam CONSTITUTIONAL: Well developed/well nourished HEAD: Normocephalic/atraumatic EYES: EOMI/PERRL ENMT: Mucous membranes moist NECK: supple no meningeal signs SPINE:entire spine nontender CV: irregular no murmurs/rubs/gallops noted LUNGS: crackles in right base, otherwise no distress, no tachypnea noted ABDOMEN: soft, nontender, no rebound or guarding GU:no cva tenderness NEURO: Pt is awake/alert,  moves all extremitiesx4, no facial droop, no arm droop EXTREMITIES: pulses normal, full ROM. She has knee immobilizer on left LE SKIN: warm, color normal PSYCH: no abnormalities of mood noted  ED Course  Procedures   Labs Reviewed  GLUCOSE, CAPILLARY - Abnormal; Notable for the following:    Glucose-Capillary 268 (*)    All other components within normal limits  BASIC METABOLIC PANEL - Abnormal; Notable for the following:    Sodium 132 (*)    Potassium 3.0 (*)    Chloride 82 (*)    CO2 41 (*)    Glucose, Bld 205 (*)    BUN 61 (*)    Creatinine, Ser 2.05 (*)    GFR calc non  Af Amer 21 (*)    GFR calc Af Amer 25 (*)    All other components within normal limits  CBC WITH DIFFERENTIAL - Abnormal; Notable for the following:    RBC 3.45 (*)    Hemoglobin 9.5 (*)    HCT 28.6 (*)    RDW 15.7 (*)    Platelets 110 (*)    All other components within normal limits   11:09 PM Pt here for generalized fatigue.  No focal weakness doubt acute CVA   No other complaints.  She requests her home CPAP (wears at night) She is in no distress.  Her CXR is improved from prior.  Mild worsening of renal insufficiency but this has been seen previously.  Urine currently pending She is on her home O2 and feels at baseline   11:47 PM CXR improving No uti Pt feels comfortable for d/c home I doubt acute CVA/ACS or emergent condition at this time Patient/family feel comfortable with plan  MDM  Nursing notes including past medical history and social history reviewed and considered in documentation xrays reviewed and considered Labs/vital reviewed and considered Previous records reviewed and considered - previous xray reviewed        Date: 02/18/2013  Rate: 102  Rhythm: atrial flutter  QRS Axis: left  Intervals: normal  ST/T Wave abnormalities: nonspecific ST changes  Conduction Disutrbances:none  Narrative Interpretation:   Old EKG Reviewed: unchanged    Joya Gaskins,  MD 02/18/13 2348

## 2013-02-18 NOTE — ED Notes (Signed)
Pt is from [place], pt complains of not feeling well.  No pain, no sob, no chest pain, a&o x 4, lungs clear bilaterally.  Pt appears normal.  No n/v.  No real chief complaint.  [employer].

## 2013-02-18 NOTE — ED Notes (Signed)
Notified respiratory for CPAP.

## 2013-02-18 NOTE — ED Notes (Signed)
Pt became short of breath, st's she usually wears a CPAP at night and is requesting that.  Pt appears slightly SOB, VSS, EDP [facility] notified.

## 2013-02-18 NOTE — ED Notes (Signed)
Respiratory in with pt applying CPAP.

## 2013-02-18 NOTE — ED Notes (Signed)
EDP [facility] at bedside. 

## 2013-02-19 NOTE — ED Notes (Signed)
Discharge paperwork given to pt's daughter.  Daughter verbalized understanding of d/c and f/u.  E-signature obtained.

## 2013-02-19 NOTE — ED Notes (Signed)
[facility] called for pt pick up 

## 2013-02-19 NOTE — ED Notes (Signed)
EMS called to Transport

## 2013-02-22 ENCOUNTER — Encounter (HOSPITAL_COMMUNITY): Payer: Self-pay | Admitting: Cardiology

## 2013-02-23 ENCOUNTER — Ambulatory Visit (HOSPITAL_COMMUNITY)
Admit: 2013-02-23 | Discharge: 2013-02-23 | Disposition: A | Discharge status: Home / Self Care | Payer: Medicare Other | Attending: Internal Medicine | Admitting: Internal Medicine

## 2013-02-23 ENCOUNTER — Encounter (HOSPITAL_COMMUNITY): Payer: Self-pay

## 2013-02-23 VITALS — BP 100/60 | HR 81

## 2013-02-23 DIAGNOSIS — N189 Chronic kidney disease, unspecified: Secondary | ICD-10-CM

## 2013-02-23 DIAGNOSIS — Z7901 Long term (current) use of anticoagulants: Secondary | ICD-10-CM | POA: Insufficient documentation

## 2013-02-23 DIAGNOSIS — I4891 Unspecified atrial fibrillation: Secondary | ICD-10-CM | POA: Insufficient documentation

## 2013-02-23 DIAGNOSIS — I480 Paroxysmal atrial fibrillation: Secondary | ICD-10-CM

## 2013-02-23 DIAGNOSIS — I5032 Chronic diastolic (congestive) heart failure: Secondary | ICD-10-CM | POA: Insufficient documentation

## 2013-02-23 LAB — BASIC METABOLIC PANEL
BUN: 75 mg/dL — ABNORMAL HIGH (ref 6–23)
Chloride: 81 mEq/L — ABNORMAL LOW (ref 96–112)
GFR calc Af Amer: 18 mL/min — ABNORMAL LOW (ref >90)
Potassium: 3.2 mEq/L — ABNORMAL LOW (ref 3.5–5.1)

## 2013-02-23 NOTE — Progress Notes (Signed)
HPI: Gina Ashley is an 79 y.o. female with history of chronic atrial fibrillation on coumadin, diastolic heart failure and HTN. She also has DM type 2, COPD, OSA on CPAP and renal failure with baseline Cr 1.5-1.7. She has had 4 hospitalizations in the last 6 months, 1 included tibia/fibula fracture and the rest have been due to massive fluid overload in the setting of diastolic heart failure. She has required short term dialysis in the past for renal failure.   She was admitted for massive volume overload.  Echo showed RV dysfunction and she began to have sluggish milrinone 0.25 mcg/kg/min.  Urine output remained low therefore low dose dopamine started 3/7.  Milrinone was weaned to off on 3/18 and dopamine to off 3/19.  Cr improved initially but by discharge had increased to 2.78 (baseline 1.8-2.0).  Transitioned to torsemide 60 mg daily.  Discharge weight 212 pounds.  Permanent atrial fib and went for TEE/DCCV on 3/18 but unable to complete due to smoke.  Continued on amio for rate control and coumadin and repeat in 4 weeks.    She returns for follow up today.  She was evaluated in the ER last week for generalized weakness.  No changes made to medications.  Discharged back to [place].  She feels pretty good today.  She is able to do most ADLs.  Still needs help transferring but hopefully going to get ok to bear weight tomorrow from ortho.  Only weighed once since discharge 213 pounds.  Edema around ankles some days.  Has Bipap but says it is not working  Well because she needs repeat sleep study on 5/5.  No orthopnea, PND.  No chest pain.     ROS: All systems negative except as listed in HPI, PMH and Problem List.  Past Medical History  Diagnosis Date  . GERD (gastroesophageal reflux disease)   . Back pain, chronic   . Pain in shoulder   . Pain in ankle   . Hiatal hernia   . HTN (hypertension)     all her life  . CKD (chronic kidney disease), stage III     GFR: 38  . Atrial fibrillation      since 1996  . Sleep apnea   . Fall 3 weeks ago    was [facility] at [facility]  . OSA on CPAP   . Depression   . PONV (postoperative nausea and vomiting)     difficult to wake up  . Chronic diastolic heart failure 08/31/2012  . Tibia fracture     BILATERAL  . Renal failure     acute on chronic   . Fibula fracture     bilateral  . Respiratory failure     acute on chronic hx of  requiring BIPAP  . COPD (chronic obstructive pulmonary disease)   . Metabolic encephalopathy     hx of   . Renal failure     acute on chronic with need for intermittent dialysis - hx of   . UTI (urinary tract infection)     hx of   . Pleural effusion     right sided now resolved   . Diabetes mellitus     x 15 yrs, hx of hypoglycemia   . CHF (congestive heart failure)     diastolic HF    Current Outpatient Prescriptions  Medication Sig Dispense Refill  . albuterol (PROVENTIL) (2.5 MG/3ML) 0.083% nebulizer solution Take 3 mLs (2.5 mg total) by nebulization every 2 (two) hours as  needed for wheezing or shortness of breath.  75 mL  12  . albuterol (PROVENTIL) (5 MG/ML) 0.5% nebulizer solution Take 0.5 mLs (2.5 mg total) by nebulization 3 (three) times daily.  20 mL    . allopurinol (ZYLOPRIM) 100 MG tablet Take 100 mg by mouth daily.      Marland Kitchen ALPRAZolam (XANAX) 0.25 MG tablet Take 0.25 mg by mouth every 8 (eight) hours as needed for anxiety.      Marland Kitchen amiodarone (PACERONE) 200 MG tablet Take 2 tablets (400 mg total) by mouth daily.      . busPIRone (BUSPAR) 5 MG tablet Take 5 mg by mouth every 12 (twelve) hours.      . carvedilol (COREG) 25 MG tablet Take 25 mg by mouth 2 (two) times daily with a meal.      . diphenhydrAMINE (BENADRYL) 25 mg capsule Take 25 mg by mouth every 8 (eight) hours as needed for itching.      . docusate sodium (COLACE) 100 MG capsule Take 100 mg by mouth every 8 (eight) hours.       Marland Kitchen guaiFENesin (MUCINEX) 600 MG 12 hr tablet Take 600 mg by mouth 2 (two) times daily.       .  insulin aspart (NOVOLOG) 100 UNIT/ML injection Inject 1-5 Units into the skin 3 (three) times daily with meals. 150-200=1unit,201-250=2units,251-300=3units,301-350=4units,351-400=5units if over 400 call MD      . insulin glargine (LANTUS) 100 UNIT/ML injection Inject 5 Units into the skin at bedtime.      . isosorbide-hydrALAZINE (BIDIL) 20-37.5 MG per tablet Take 1 tablet by mouth 2 (two) times daily.      . metolazone (ZAROXOLYN) 5 MG tablet Take 1 tablet (5 mg total) by mouth daily.      . Multiple Vitamin (MULTIVITAMIN WITH MINERALS) TABS Take 1 tablet by mouth daily.      . nitroGLYCERIN (NITROSTAT) 0.4 MG SL tablet Place 0.4 mg under the tongue every 5 (five) minutes as needed. Chest pain      . omeprazole (PRILOSEC) 20 MG capsule Take 40 mg by mouth daily.      . ondansetron (ZOFRAN) 4 MG tablet Take 1 tablet (4 mg total) by mouth every 6 (six) hours as needed for nausea.  20 tablet    . oxyCODONE (OXY IR/ROXICODONE) 5 MG immediate release tablet Take 5 mg by mouth every 4 (four) hours as needed for pain.      Marland Kitchen oxyCODONE (OXYCONTIN) 10 MG 12 hr tablet Take 1 tablet (10 mg total) by mouth every 12 (twelve) hours.  20 tablet  0  . polyethylene glycol (MIRALAX / GLYCOLAX) packet Take 17 g by mouth 2 (two) times daily.       Marland Kitchen saccharomyces boulardii (FLORASTOR) 250 MG capsule Take 1 capsule (250 mg total) by mouth 2 (two) times daily.      . simethicone (MYLICON) 80 MG chewable tablet Chew 80 mg by mouth every 6 (six) hours as needed for flatulence.      . torsemide (DEMADEX) 20 MG tablet Take 3 tablets (60 mg total) by mouth daily.      . traZODone (DESYREL) 50 MG tablet Take 50 mg by mouth at bedtime.      Marland Kitchen warfarin (COUMADIN) 3 MG tablet Take 3 mg by mouth daily.       No current facility-administered medications for this encounter.     PHYSICAL EXAM: Filed Vitals:   02/23/13 0840  BP: 100/60  Pulse: 81  SpO2:  99%    General:  Chronically ill appearing. No resp difficulty on 2L  O2 per n/c HEENT: normal Neck: supple. JVP 7-8. Carotids 2+ bilaterally; no bruits. No lymphadenopathy or thryomegaly appreciated. Cor: Distant, irregularly irregular.  No rubs, gallops or murmurs. Lungs: clear but distant Abdomen: obese, soft, nontender, nondistended. No hepatosplenomegaly. No bruits or masses. Good bowel sounds. Extremities: no cyanosis, clubbing, rash, tr edema Rt lower extremity, Lt lower extremity in full brace Neuro: alert & orientedx3, cranial nerves grossly intact. Moves all 4 extremities w/o difficulty. Affect pleasant.   ASSESSMENT & PLAN:

## 2013-02-23 NOTE — Patient Instructions (Addendum)
-   BMET today 

## 2013-02-24 ENCOUNTER — Other Ambulatory Visit: Payer: Self-pay | Admitting: *Deleted

## 2013-02-24 MED ORDER — ALPRAZOLAM 0.5 MG PO TABS
ORAL_TABLET | ORAL | Status: DC
Start: 2013-02-24 — End: 2013-03-10

## 2013-02-24 NOTE — Assessment & Plan Note (Signed)
She continues to do very well at the facility.  Volume status well controlled.  Will repeat BMET today, Cr climbing last week in ER.  If Cr remains high will cut back metolazone.  See back in 3 weeks to determine if weight remains stable.  Have requested [facility] to start weighing patient daily and fax weights every Monday.

## 2013-02-24 NOTE — Assessment & Plan Note (Signed)
Continue amiodarone and coumadin.  No signs of bleeding.

## 2013-02-24 NOTE — Assessment & Plan Note (Signed)
Will check BMET today.  Baseline Cr 1.8-2.

## 2013-03-08 ENCOUNTER — Ambulatory Visit (HOSPITAL_BASED_OUTPATIENT_CLINIC_OR_DEPARTMENT_OTHER): Payer: Medicare Other

## 2013-03-08 VITALS — Ht 63.0 in | Wt 212.0 lb

## 2013-03-08 DIAGNOSIS — R0989 Other specified symptoms and signs involving the circulatory and respiratory systems: Secondary | ICD-10-CM | POA: Insufficient documentation

## 2013-03-08 DIAGNOSIS — R0602 Shortness of breath: Secondary | ICD-10-CM | POA: Insufficient documentation

## 2013-03-08 DIAGNOSIS — R0609 Other forms of dyspnea: Secondary | ICD-10-CM | POA: Insufficient documentation

## 2013-03-08 DIAGNOSIS — G4730 Sleep apnea, unspecified: Secondary | ICD-10-CM | POA: Insufficient documentation

## 2013-03-08 DIAGNOSIS — R5381 Other malaise: Secondary | ICD-10-CM | POA: Insufficient documentation

## 2013-03-08 DIAGNOSIS — G4733 Obstructive sleep apnea (adult) (pediatric): Secondary | ICD-10-CM

## 2013-03-08 DIAGNOSIS — E669 Obesity, unspecified: Secondary | ICD-10-CM | POA: Insufficient documentation

## 2013-03-08 DIAGNOSIS — R5383 Other fatigue: Secondary | ICD-10-CM | POA: Insufficient documentation

## 2013-03-10 ENCOUNTER — Emergency Department (HOSPITAL_COMMUNITY): Payer: Medicare Other

## 2013-03-10 ENCOUNTER — Inpatient Hospital Stay (HOSPITAL_COMMUNITY)
Admission: EM | Admit: 2013-03-10 | Discharge: 2013-03-17 | DRG: 291 | Disposition: A | Discharge status: Skilled Nursing Facility | Payer: Medicare Other | Attending: Internal Medicine | Admitting: Internal Medicine

## 2013-03-10 DIAGNOSIS — Z888 Allergy status to other drugs, medicaments and biological substances status: Secondary | ICD-10-CM

## 2013-03-10 DIAGNOSIS — N184 Chronic kidney disease, stage 4 (severe): Secondary | ICD-10-CM | POA: Diagnosis present

## 2013-03-10 DIAGNOSIS — I480 Paroxysmal atrial fibrillation: Secondary | ICD-10-CM

## 2013-03-10 DIAGNOSIS — E119 Type 2 diabetes mellitus without complications: Secondary | ICD-10-CM | POA: Diagnosis present

## 2013-03-10 DIAGNOSIS — Z79899 Other long term (current) drug therapy: Secondary | ICD-10-CM

## 2013-03-10 DIAGNOSIS — E876 Hypokalemia: Secondary | ICD-10-CM | POA: Diagnosis present

## 2013-03-10 DIAGNOSIS — I130 Hypertensive heart and chronic kidney disease with heart failure and stage 1 through stage 4 chronic kidney disease, or unspecified chronic kidney disease: Principal | ICD-10-CM | POA: Diagnosis present

## 2013-03-10 DIAGNOSIS — Z6838 Body mass index (BMI) 38.0-38.9, adult: Secondary | ICD-10-CM

## 2013-03-10 DIAGNOSIS — E871 Hypo-osmolality and hyponatremia: Secondary | ICD-10-CM | POA: Diagnosis present

## 2013-03-10 DIAGNOSIS — Z91018 Allergy to other foods: Secondary | ICD-10-CM

## 2013-03-10 DIAGNOSIS — Z96659 Presence of unspecified artificial knee joint: Secondary | ICD-10-CM

## 2013-03-10 DIAGNOSIS — Z794 Long term (current) use of insulin: Secondary | ICD-10-CM

## 2013-03-10 DIAGNOSIS — I5033 Acute on chronic diastolic (congestive) heart failure: Secondary | ICD-10-CM | POA: Diagnosis present

## 2013-03-10 DIAGNOSIS — I1310 Hypertensive heart and chronic kidney disease without heart failure, with stage 1 through stage 4 chronic kidney disease, or unspecified chronic kidney disease: Secondary | ICD-10-CM | POA: Diagnosis present

## 2013-03-10 DIAGNOSIS — K219 Gastro-esophageal reflux disease without esophagitis: Secondary | ICD-10-CM | POA: Diagnosis present

## 2013-03-10 DIAGNOSIS — M545 Low back pain, unspecified: Secondary | ICD-10-CM | POA: Diagnosis present

## 2013-03-10 DIAGNOSIS — F3289 Other specified depressive episodes: Secondary | ICD-10-CM | POA: Diagnosis present

## 2013-03-10 DIAGNOSIS — Z66 Do not resuscitate: Secondary | ICD-10-CM | POA: Diagnosis present

## 2013-03-10 DIAGNOSIS — I4891 Unspecified atrial fibrillation: Secondary | ICD-10-CM | POA: Diagnosis present

## 2013-03-10 DIAGNOSIS — E669 Obesity, unspecified: Secondary | ICD-10-CM | POA: Diagnosis present

## 2013-03-10 DIAGNOSIS — F329 Major depressive disorder, single episode, unspecified: Secondary | ICD-10-CM | POA: Diagnosis present

## 2013-03-10 DIAGNOSIS — I509 Heart failure, unspecified: Secondary | ICD-10-CM

## 2013-03-10 DIAGNOSIS — N189 Chronic kidney disease, unspecified: Secondary | ICD-10-CM

## 2013-03-10 DIAGNOSIS — G8929 Other chronic pain: Secondary | ICD-10-CM | POA: Diagnosis present

## 2013-03-10 DIAGNOSIS — Z880 Allergy status to penicillin: Secondary | ICD-10-CM

## 2013-03-10 DIAGNOSIS — Z7901 Long term (current) use of anticoagulants: Secondary | ICD-10-CM

## 2013-03-10 DIAGNOSIS — K5900 Constipation, unspecified: Secondary | ICD-10-CM | POA: Diagnosis present

## 2013-03-10 DIAGNOSIS — F32A Depression, unspecified: Secondary | ICD-10-CM | POA: Diagnosis present

## 2013-03-10 DIAGNOSIS — I482 Chronic atrial fibrillation, unspecified: Secondary | ICD-10-CM

## 2013-03-10 DIAGNOSIS — I10 Essential (primary) hypertension: Secondary | ICD-10-CM | POA: Diagnosis present

## 2013-03-10 DIAGNOSIS — Z885 Allergy status to narcotic agent status: Secondary | ICD-10-CM

## 2013-03-10 DIAGNOSIS — N179 Acute kidney failure, unspecified: Secondary | ICD-10-CM | POA: Diagnosis present

## 2013-03-10 DIAGNOSIS — G4733 Obstructive sleep apnea (adult) (pediatric): Secondary | ICD-10-CM | POA: Diagnosis present

## 2013-03-10 HISTORY — DX: Adverse effect of unspecified anesthetic, initial encounter: T41.45XA

## 2013-03-10 HISTORY — DX: Low back pain: M54.5

## 2013-03-10 HISTORY — DX: Other complications of anesthesia, initial encounter: T88.59XA

## 2013-03-10 HISTORY — DX: Type 2 diabetes mellitus without complications: E11.9

## 2013-03-10 HISTORY — DX: Gout, unspecified: M10.9

## 2013-03-10 HISTORY — DX: Other chronic pain: G89.29

## 2013-03-10 HISTORY — DX: Anxiety disorder, unspecified: F41.9

## 2013-03-10 HISTORY — DX: Chronic atrial fibrillation, unspecified: I48.20

## 2013-03-10 HISTORY — DX: Low back pain, unspecified: M54.50

## 2013-03-10 HISTORY — DX: Unspecified osteoarthritis, unspecified site: M19.90

## 2013-03-10 HISTORY — DX: Anemia, unspecified: D64.9

## 2013-03-10 HISTORY — DX: Personal history of other medical treatment: Z92.89

## 2013-03-10 LAB — URINE MICROSCOPIC-ADD ON

## 2013-03-10 LAB — URINALYSIS, ROUTINE W REFLEX MICROSCOPIC
Bilirubin Urine: NEGATIVE
Glucose, UA: NEGATIVE mg/dL
Hgb urine dipstick: NEGATIVE
Ketones, ur: NEGATIVE mg/dL
pH: 5 (ref 5.0–8.0)

## 2013-03-10 LAB — CBC
Platelets: 89 10*3/uL — ABNORMAL LOW (ref 150–400)
RBC: 3.54 MIL/uL — ABNORMAL LOW (ref 3.87–5.11)
RDW: 16.7 % — ABNORMAL HIGH (ref 11.5–15.5)
WBC: 5.9 10*3/uL (ref 4.0–10.5)

## 2013-03-10 LAB — COMPREHENSIVE METABOLIC PANEL
ALT: 11 U/L (ref 0–35)
AST: 19 U/L (ref 0–37)
Albumin: 4.1 g/dL (ref 3.5–5.2)
Alkaline Phosphatase: 121 U/L — ABNORMAL HIGH (ref 39–117)
Potassium: 4.1 mEq/L (ref 3.5–5.1)
Sodium: 127 mEq/L — ABNORMAL LOW (ref 135–145)
Total Protein: 7.7 g/dL (ref 6.0–8.3)

## 2013-03-10 MED ORDER — FUROSEMIDE 10 MG/ML IJ SOLN
80.0000 mg | Freq: Once | INTRAMUSCULAR | Status: AC
  Administered 2013-03-11: 80 mg via INTRAVENOUS
  Filled 2013-03-10: qty 8

## 2013-03-10 MED ORDER — OXYCODONE-ACETAMINOPHEN 5-325 MG PO TABS
1.0000 | ORAL_TABLET | Freq: Once | ORAL | Status: AC
  Administered 2013-03-10: 1 via ORAL
  Filled 2013-03-10: qty 1

## 2013-03-10 NOTE — ED Provider Notes (Signed)
History    CSN: [id number] Arrival date & time 03/10/13  2010 First MD Initiated Contact with Patient 03/10/13 2011      Chief Complaint  Patient presents with  . Weakness    HPI The patient presents to the emergency room with complaints of generalized weakness and fatigue.  The patient has a history of heart failure or, obstructive sleep apnea, and multiple other medical problems. Patient states all day today she has felt generally weak. She's noticed increasing swelling in her lower extremities. She has not particularly felt short of breath. Patient states she is currently in nursing home rehabilitation facility. she states they feed her a lot of salty foods and she thinks related to the trouble that she has. patient denies any fevers or chest pain. She denies any headache. She denies any focal numbness or weakness. Past Medical History  Diagnosis Date  . GERD (gastroesophageal reflux disease)   . Back pain, chronic   . Pain in shoulder   . Pain in ankle   . Hiatal hernia   . HTN (hypertension)     all her life  . CKD (chronic kidney disease), stage III     GFR: 38  . Atrial fibrillation     since 1996  . Sleep apnea   . Fall 3 weeks ago    was evaluated at [facility]  . OSA on CPAP   . Depression   . PONV (postoperative nausea and vomiting)     difficult to wake up  . Chronic diastolic heart failure 08/31/2012  . Tibia fracture     BILATERAL  . Renal failure     acute on chronic   . Fibula fracture     bilateral  . Respiratory failure     acute on chronic hx of  requiring BIPAP  . COPD (chronic obstructive pulmonary disease)   . Metabolic encephalopathy     hx of   . Renal failure     acute on chronic with need for intermittent dialysis - hx of   . UTI (urinary tract infection)     hx of   . Pleural effusion     right sided now resolved   . Diabetes mellitus     x 15 yrs, hx of hypoglycemia   . CHF (congestive heart failure)     diastolic HF    Past Surgical  History  Procedure Laterality Date  . Bunionectomy      bilateral  . Cholecystectomy    . Rotator cuff repair      2002  . Total knee arthroplasty      bilateral 2001  . Hammer toe surgery      2004  . Colonoscopy  07/11/2011    Procedure: COLONOSCOPY;  Surgeon: Malissa Hippo, MD;  Location: [facility];  Service: Endoscopy;  Laterality: N/A;  3:00  . Orif periprosthetic fracture  09/08/2012    Procedure: OPEN REDUCTION INTERNAL FIXATION (ORIF) PERIPROSTHETIC FRACTURE;  Surgeon: Shelda Pal, MD;  Location: [facility] ORS;  Service: Orthopedics;  Laterality: Right;  ORIF periprosthetic right proximal femur fracture Spanning external fixator left distal tibia  . External fixation leg  09/08/2012    Procedure: EXTERNAL FIXATION LEG;  Surgeon: Shelda Pal, MD;  Location: [facility] OR[facility];  Service: Orthopedics;  Laterality: Left;  . Joint replacement      bilateral knees   . External fixation leg  11/30/2012    Procedure: EXTERNAL FIXATION LEG;  Surgeon: Madlyn Frankel  Charlann Boxer, MD;  Location: [facility];  Service: Orthopedics;  Laterality: Left;  Removal of External Fixation Left Knee with Evaluation with Floroscopy  . Tee without cardioversion N/A 01/19/2013    Procedure: TRANSESOPHAGEAL ECHOCARDIOGRAM (TEE);  Surgeon: Lewayne Bunting, MD;  Location: [facility] ENDOSCOPY;  Service: Cardiovascular;  Laterality: N/A;    Family History  Problem Relation Age of Onset  . Colon cancer Brother     History  Substance Use Topics  . Smoking status: Never Smoker   . Smokeless tobacco: Never Used  . Alcohol Use: No    OB History   Grav Para Term Preterm Abortions TAB SAB Ect Mult Living                  Review of Systems  All other systems reviewed and are negative.    Allergies  Morphine sulfate; Remeron; Tuna; and Penicillins  Home Medications   Current Outpatient Rx  Name  Route  Sig  Dispense  Refill  . acetaminophen (TYLENOL) 325 MG tablet   Oral   Take 650 mg by mouth every 6 (six) hours as needed  for pain.         Marland Kitchen albuterol (PROVENTIL) (2.5 MG/3ML) 0.083% nebulizer solution   Nebulization   Take 3 mLs (2.5 mg total) by nebulization every 2 (two) hours as needed for wheezing or shortness of breath.   75 mL   12   . allopurinol (ZYLOPRIM) 100 MG tablet   Oral   Take 100 mg by mouth daily.         Marland Kitchen ALPRAZolam (XANAX) 0.25 MG tablet   Oral   Take 0.25-0.5 mg by mouth every 8 (eight) hours as needed for anxiety.          Marland Kitchen amiodarone (PACERONE) 200 MG tablet   Oral   Take 2 tablets (400 mg total) by mouth daily.         . busPIRone (BUSPAR) 5 MG tablet   Oral   Take 5 mg by mouth every 12 (twelve) hours.         . carvedilol (COREG) 25 MG tablet   Oral   Take 25 mg by mouth 2 (two) times daily with a meal.         . diphenhydrAMINE (BENADRYL) 25 mg capsule   Oral   Take 25 mg by mouth every 8 (eight) hours as needed for itching.         . docusate sodium (COLACE) 100 MG capsule   Oral   Take 100 mg by mouth every 8 (eight) hours.          Marland Kitchen guaiFENesin (MUCINEX) 600 MG 12 hr tablet   Oral   Take 600 mg by mouth 2 (two) times daily.          . insulin aspart (NOVOLOG) 100 UNIT/ML injection   Subcutaneous   Inject 3 Units into the skin 3 (three) times daily with meals. If cbg is greater than 150         . insulin glargine (LANTUS) 100 UNIT/ML injection   Subcutaneous   Inject 5 Units into the skin at bedtime.         . isosorbide-hydrALAZINE (BIDIL) 20-37.5 MG per tablet   Oral   Take 1 tablet by mouth 2 (two) times daily.         . Multiple Vitamin (MULTIVITAMIN WITH MINERALS) TABS   Oral   Take 1 tablet by mouth daily.         Marland Kitchen  omeprazole (PRILOSEC) 20 MG capsule   Oral   Take 40 mg by mouth daily.         . ondansetron (ZOFRAN) 4 MG tablet   Oral   Take 1 tablet (4 mg total) by mouth every 6 (six) hours as needed for nausea.   20 tablet      . oxyCODONE (OXY IR/ROXICODONE) 5 MG immediate release tablet   Oral    Take 5 mg by mouth every 4 (four) hours as needed for pain (breakthrough).          Marland Kitchen oxyCODONE (OXYCONTIN) 10 MG 12 hr tablet   Oral   Take 1 tablet (10 mg total) by mouth every 12 (twelve) hours.   20 tablet   0   . polyethylene glycol (MIRALAX / GLYCOLAX) packet   Oral   Take 17 g by mouth 2 (two) times daily.          Marland Kitchen saccharomyces boulardii (FLORASTOR) 250 MG capsule   Oral   Take 1 capsule (250 mg total) by mouth 2 (two) times daily.         . simethicone (MYLICON) 80 MG chewable tablet   Oral   Chew 80 mg by mouth every 6 (six) hours as needed for flatulence.         . torsemide (DEMADEX) 20 MG tablet   Oral   Take 3 tablets (60 mg total) by mouth daily.         . traZODone (DESYREL) 50 MG tablet   Oral   Take 50 mg by mouth at bedtime.         Marland Kitchen warfarin (COUMADIN) 4 MG tablet   Oral   Take 4 mg by mouth daily.           BP 117/75  Pulse 68  Temp(Src) 98.1 F (36.7 C) (Oral)  Resp 13  SpO2 96%  Physical Exam  Nursing note and vitals reviewed. Constitutional: No distress.  Obese  HENT:  Head: Normocephalic and atraumatic.  Right Ear: External ear normal.  Left Ear: External ear normal.  Eyes: Conjunctivae are normal. Right eye exhibits no discharge. Left eye exhibits no discharge. No scleral icterus.  Neck: Neck supple. No tracheal deviation present.  Neck veins are full  Cardiovascular: Normal rate, regular rhythm and intact distal pulses.   Pulmonary/Chest: Effort normal and breath sounds normal. No stridor. No respiratory distress. She has no wheezes. She has no rales.  Abdominal: Soft. Bowel sounds are normal. She exhibits no distension. There is no tenderness. There is no rebound and no guarding.  Musculoskeletal: She exhibits edema. She exhibits no tenderness.  Tense edema bilateral lower extremities  Neurological: She is alert. She has normal strength. No sensory deficit. Cranial nerve deficit:  no gross defecits noted. She  exhibits normal muscle tone. She displays no seizure activity. Coordination normal.  Skin: Skin is warm and dry. No rash noted.  Psychiatric: She has a normal mood and affect.    ED Course  Procedures (including critical care time) EKG A true fibrillation Nonspecific intraventricular conduction delay Abnormal R wave progression No significant change when compared to prior EKG Labs Reviewed  CBC - Abnormal; Notable for the following:    RBC 3.54 (*)    Hemoglobin 9.9 (*)    HCT 29.4 (*)    RDW 16.7 (*)    Platelets 89 (*)    All other components within normal limits  PRO B NATRIURETIC PEPTIDE - Abnormal; Notable for  the following:    Pro B Natriuretic peptide (BNP) 4061.0 (*)    All other components within normal limits  URINALYSIS, ROUTINE W REFLEX MICROSCOPIC - Abnormal; Notable for the following:    Leukocytes, UA SMALL (*)    All other components within normal limits  URINE MICROSCOPIC-ADD ON  COMPREHENSIVE METABOLIC PANEL  POCT I-STAT TROPONIN I   Dg Chest 2 View  03/10/2013  *RADIOLOGY REPORT*  Clinical Data: Weakness and chest pain.  No shortness of breath.  CHEST - 2 VIEW  Comparison: 02/18/2013.  Findings: Cardiac enlargement.  Right base effusion appears increased.  Right basilar atelectasis.  No infiltrate or overt edema.  Degenerative changes thoracic spine.  IMPRESSION: Increased right pleural effusion.   Original Report Authenticated By: Davonna Belling, M.D.      1. CHF (congestive heart failure)       MDM  Clinically patient appears to have congestive heart failure and fluid overload. Chest x-ray does show increasing right pleural effusion.  Patient has distended neck veins.  He'll give her dose of Lasix and consult the medical service regarding admission for diuresis and further treatment of congestive heart failure        Celene Kras, MD 03/10/13 [phone]

## 2013-03-10 NOTE — ED Notes (Signed)
EMS called to [facility] facility due to Pt. C/o generalized weakness, swelling in lower extremities.

## 2013-03-11 ENCOUNTER — Encounter (HOSPITAL_COMMUNITY): Payer: Self-pay | Admitting: General Practice

## 2013-03-11 LAB — MRSA PCR SCREENING: MRSA by PCR: NEGATIVE

## 2013-03-11 LAB — PROTIME-INR: INR: 3.1 — ABNORMAL HIGH (ref 0.00–1.49)

## 2013-03-11 LAB — TROPONIN I
Troponin I: <0.3 ng/mL (ref <0.30)
Troponin I: <0.3 ng/mL (ref <0.30)

## 2013-03-11 MED ORDER — SACCHAROMYCES BOULARDII 250 MG PO CAPS
250.0000 mg | ORAL_CAPSULE | Freq: Two times a day (BID) | ORAL | Status: DC
  Administered 2013-03-11 – 2013-03-17 (×13): 250 mg via ORAL
  Filled 2013-03-11 (×14): qty 1

## 2013-03-11 MED ORDER — OXYCODONE HCL 5 MG PO TABS
5.0000 mg | ORAL_TABLET | ORAL | Status: DC | PRN
  Administered 2013-03-11 – 2013-03-17 (×20): 5 mg via ORAL
  Filled 2013-03-11 (×22): qty 1

## 2013-03-11 MED ORDER — ALBUTEROL SULFATE (5 MG/ML) 0.5% IN NEBU
2.5000 mg | INHALATION_SOLUTION | Freq: Once | RESPIRATORY_TRACT | Status: AC
  Administered 2013-03-11: 2.5 mg via RESPIRATORY_TRACT
  Filled 2013-03-11: qty 0.5

## 2013-03-11 MED ORDER — SIMETHICONE 80 MG PO CHEW
80.0000 mg | CHEWABLE_TABLET | Freq: Four times a day (QID) | ORAL | Status: DC | PRN
  Filled 2013-03-11: qty 1

## 2013-03-11 MED ORDER — INSULIN ASPART 100 UNIT/ML ~~LOC~~ SOLN
0.0000 [IU] | Freq: Every day | SUBCUTANEOUS | Status: DC
  Administered 2013-03-11: 2 [IU] via SUBCUTANEOUS

## 2013-03-11 MED ORDER — AMIODARONE HCL 200 MG PO TABS
400.0000 mg | ORAL_TABLET | Freq: Every day | ORAL | Status: DC
  Administered 2013-03-11 – 2013-03-17 (×7): 400 mg via ORAL
  Filled 2013-03-11 (×7): qty 2

## 2013-03-11 MED ORDER — TRAZODONE HCL 50 MG PO TABS
50.0000 mg | ORAL_TABLET | Freq: Every day | ORAL | Status: DC
  Administered 2013-03-11 – 2013-03-16 (×6): 50 mg via ORAL
  Filled 2013-03-11 (×8): qty 1

## 2013-03-11 MED ORDER — INSULIN GLARGINE 100 UNIT/ML ~~LOC~~ SOLN
5.0000 [IU] | Freq: Every day | SUBCUTANEOUS | Status: DC
  Administered 2013-03-11 – 2013-03-16 (×6): 5 [IU] via SUBCUTANEOUS
  Filled 2013-03-11 (×8): qty 0.05

## 2013-03-11 MED ORDER — DIPHENHYDRAMINE HCL 25 MG PO CAPS: 25.0000 mg | ORAL_CAPSULE | Freq: Three times a day (TID) | ORAL | Status: DC | PRN

## 2013-03-11 MED ORDER — DOCUSATE SODIUM 100 MG PO CAPS
100.0000 mg | ORAL_CAPSULE | Freq: Three times a day (TID) | ORAL | Status: DC
  Administered 2013-03-11 – 2013-03-17 (×20): 100 mg via ORAL
  Filled 2013-03-11 (×23): qty 1

## 2013-03-11 MED ORDER — OXYCODONE HCL ER 10 MG PO T12A
10.0000 mg | EXTENDED_RELEASE_TABLET | Freq: Two times a day (BID) | ORAL | Status: DC
  Administered 2013-03-11 – 2013-03-17 (×13): 10 mg via ORAL
  Filled 2013-03-11 (×13): qty 1

## 2013-03-11 MED ORDER — ONDANSETRON HCL 4 MG/2ML IJ SOLN: 4.0000 mg | Freq: Four times a day (QID) | INTRAMUSCULAR | Status: DC | PRN

## 2013-03-11 MED ORDER — PANTOPRAZOLE SODIUM 40 MG PO TBEC
40.0000 mg | DELAYED_RELEASE_TABLET | Freq: Every day | ORAL | Status: DC
  Administered 2013-03-11 – 2013-03-17 (×7): 40 mg via ORAL
  Filled 2013-03-11 (×7): qty 1

## 2013-03-11 MED ORDER — CARVEDILOL 12.5 MG PO TABS
12.5000 mg | ORAL_TABLET | Freq: Two times a day (BID) | ORAL | Status: DC
  Administered 2013-03-11 – 2013-03-17 (×13): 12.5 mg via ORAL
  Filled 2013-03-11 (×15): qty 1

## 2013-03-11 MED ORDER — BUSPIRONE HCL 5 MG PO TABS
5.0000 mg | ORAL_TABLET | Freq: Two times a day (BID) | ORAL | Status: DC
  Administered 2013-03-11 – 2013-03-17 (×14): 5 mg via ORAL
  Filled 2013-03-11 (×15): qty 1

## 2013-03-11 MED ORDER — SODIUM CHLORIDE 0.9 % IV SOLN: 250.0000 mL | INTRAVENOUS | Status: DC | PRN

## 2013-03-11 MED ORDER — ALPRAZOLAM 0.25 MG PO TABS
0.2500 mg | ORAL_TABLET | Freq: Three times a day (TID) | ORAL | Status: DC | PRN
  Administered 2013-03-11: 0.25 mg via ORAL
  Administered 2013-03-13 – 2013-03-16 (×3): 0.5 mg via ORAL
  Filled 2013-03-11: qty 1
  Filled 2013-03-11 (×3): qty 2

## 2013-03-11 MED ORDER — FUROSEMIDE 10 MG/ML IJ SOLN
80.0000 mg | Freq: Two times a day (BID) | INTRAMUSCULAR | Status: DC
  Administered 2013-03-11 – 2013-03-12 (×3): 80 mg via INTRAVENOUS
  Filled 2013-03-11 (×5): qty 8

## 2013-03-11 MED ORDER — ADULT MULTIVITAMIN W/MINERALS CH
1.0000 | ORAL_TABLET | Freq: Every day | ORAL | Status: DC
  Administered 2013-03-11 – 2013-03-17 (×7): 1 via ORAL
  Filled 2013-03-11 (×7): qty 1

## 2013-03-11 MED ORDER — ALLOPURINOL 100 MG PO TABS
100.0000 mg | ORAL_TABLET | Freq: Every day | ORAL | Status: DC
  Administered 2013-03-12 – 2013-03-17 (×6): 100 mg via ORAL
  Filled 2013-03-11 (×6): qty 1

## 2013-03-11 MED ORDER — SODIUM CHLORIDE 0.9 % IJ SOLN
3.0000 mL | Freq: Two times a day (BID) | INTRAMUSCULAR | Status: DC
  Administered 2013-03-11 – 2013-03-17 (×14): 3 mL via INTRAVENOUS

## 2013-03-11 MED ORDER — SODIUM CHLORIDE 0.9 % IJ SOLN
3.0000 mL | INTRAMUSCULAR | Status: DC | PRN
  Administered 2013-03-16: 3 mL via INTRAVENOUS

## 2013-03-11 MED ORDER — ACETAMINOPHEN 325 MG PO TABS
650.0000 mg | ORAL_TABLET | ORAL | Status: DC | PRN
  Administered 2013-03-13 – 2013-03-16 (×6): 650 mg via ORAL
  Filled 2013-03-11 (×6): qty 2

## 2013-03-11 MED ORDER — POLYETHYLENE GLYCOL 3350 17 G PO PACK
17.0000 g | PACK | Freq: Two times a day (BID) | ORAL | Status: DC
  Administered 2013-03-11 – 2013-03-17 (×13): 17 g via ORAL
  Filled 2013-03-11 (×16): qty 1

## 2013-03-11 MED ORDER — INSULIN ASPART 100 UNIT/ML ~~LOC~~ SOLN
0.0000 [IU] | Freq: Three times a day (TID) | SUBCUTANEOUS | Status: DC
  Administered 2013-03-12 (×2): 3 [IU] via SUBCUTANEOUS
  Administered 2013-03-12: 2 [IU] via SUBCUTANEOUS
  Administered 2013-03-13 (×2): 3 [IU] via SUBCUTANEOUS
  Administered 2013-03-13: 2 [IU] via SUBCUTANEOUS
  Administered 2013-03-14 – 2013-03-15 (×3): 3 [IU] via SUBCUTANEOUS
  Administered 2013-03-15: 5 [IU] via SUBCUTANEOUS
  Administered 2013-03-15: 3 [IU] via SUBCUTANEOUS
  Administered 2013-03-16: 5 [IU] via SUBCUTANEOUS
  Administered 2013-03-16: 3 [IU] via SUBCUTANEOUS
  Administered 2013-03-16 – 2013-03-17 (×2): 2 [IU] via SUBCUTANEOUS

## 2013-03-11 MED ORDER — ACETAMINOPHEN 325 MG PO TABS: 650.0000 mg | ORAL_TABLET | Freq: Four times a day (QID) | ORAL | Status: DC | PRN

## 2013-03-11 MED ORDER — WARFARIN - PHARMACIST DOSING INPATIENT
Freq: Every day | Status: DC
  Administered 2013-03-12: 18:00:00

## 2013-03-11 NOTE — Progress Notes (Signed)
ANTICOAGULATION CONSULT NOTE - Initial Consult  Pharmacy Consult for Coumadin Indication: atrial fibrillation  Allergies  Allergen Reactions  . Morphine Sulfate Other (See Comments)    REACTION: change in personality  . Remeron (Mirtazapine) Other (See Comments)    Altered mental status, lethargy  . Tuna (Fish Allergy) Other (See Comments)    unknown  . Penicillins Rash    Tolerates Ancef.    Patient Measurements: Height: 5\' 3"  (160 cm) Weight: 225 lb 1.4 oz (102.1 kg) IBW/kg (Calculated) : 52.4  Vital Signs: Temp: 98.4 F (36.9 C) (05/08 0204) Temp src: Oral (05/08 0204) BP: 139/76 mmHg (05/08 0204) Pulse Rate: 94 (05/08 0204)  Labs:  Recent Labs  03/10/13 2117 03/11/13 0215  HGB 9.9*  --   HCT 29.4*  --   PLT 89*  --   LABPROT  --  30.3*  INR  --  3.10*  CREATININE 3.06*  --   TROPONINI  --  <0.30    Estimated Creatinine Clearance: 15.9 ml/min (by C-G formula based on Cr of 3.06).   Medical History: Past Medical History  Diagnosis Date  . GERD (gastroesophageal reflux disease)   . Hiatal hernia   . HTN (hypertension)     all her life  . CKD (chronic kidney disease), stage III     GFR: 38  . Atrial fibrillation     since 1996  . Depression   . Chronic diastolic heart failure 08/31/2012  . Tibia fracture     BILATERAL  . Renal failure     acute on chronic   . Respiratory failure     acute on chronic hx of  requiring BIPAP  . COPD (chronic obstructive pulmonary disease)   . Metabolic encephalopathy     hx of   . Renal failure     acute on chronic with need for intermittent dialysis - hx of   . UTI (urinary tract infection)     hx of   . Pleural effusion     right sided now resolved   . CHF (congestive heart failure)     diastolic HF  . Complication of anesthesia     "hard to wake up" (03/11/2013)  . OSA on CPAP   . Type II diabetes mellitus 1980's?  . History of blood transfusion 08/2012    "related to leg OR" (03/11/2013)  . Arthritis     "left arm; lower back, hips, hands" (03/11/2013)  . Chronic lower back pain     "disc pops in and out" (03/11/2013)  . Gout   . Anxiety   . Fall 08/2012    "in nursing home" (03/11/2013)  . Anemia     Medications:  Prescriptions prior to admission  Medication Sig Dispense Refill  . acetaminophen (TYLENOL) 325 MG tablet Take 650 mg by mouth every 6 (six) hours as needed for pain.      Marland Kitchen albuterol (PROVENTIL) (2.5 MG/3ML) 0.083% nebulizer solution Take 3 mLs (2.5 mg total) by nebulization every 2 (two) hours as needed for wheezing or shortness of breath.  75 mL  12  . allopurinol (ZYLOPRIM) 100 MG tablet Take 100 mg by mouth daily.      Marland Kitchen ALPRAZolam (XANAX) 0.25 MG tablet Take 0.25-0.5 mg by mouth every 8 (eight) hours as needed for anxiety.       Marland Kitchen amiodarone (PACERONE) 200 MG tablet Take 2 tablets (400 mg total) by mouth daily.      . busPIRone (BUSPAR) 5 MG tablet  Take 5 mg by mouth every 12 (twelve) hours.      . carvedilol (COREG) 25 MG tablet Take 25 mg by mouth 2 (two) times daily with a meal.      . diphenhydrAMINE (BENADRYL) 25 mg capsule Take 25 mg by mouth every 8 (eight) hours as needed for itching.      . docusate sodium (COLACE) 100 MG capsule Take 100 mg by mouth every 8 (eight) hours.       Marland Kitchen guaiFENesin (MUCINEX) 600 MG 12 hr tablet Take 600 mg by mouth 2 (two) times daily.       . insulin aspart (NOVOLOG) 100 UNIT/ML injection Inject 3 Units into the skin 3 (three) times daily with meals. If cbg is greater than 150      . insulin glargine (LANTUS) 100 UNIT/ML injection Inject 5 Units into the skin at bedtime.      . isosorbide-hydrALAZINE (BIDIL) 20-37.5 MG per tablet Take 1 tablet by mouth 2 (two) times daily.      . Multiple Vitamin (MULTIVITAMIN WITH MINERALS) TABS Take 1 tablet by mouth daily.      Marland Kitchen omeprazole (PRILOSEC) 20 MG capsule Take 40 mg by mouth daily.      . ondansetron (ZOFRAN) 4 MG tablet Take 1 tablet (4 mg total) by mouth every 6 (six) hours as needed for  nausea.  20 tablet    . oxyCODONE (OXY IR/ROXICODONE) 5 MG immediate release tablet Take 5 mg by mouth every 4 (four) hours as needed for pain (breakthrough).       Marland Kitchen oxyCODONE (OXYCONTIN) 10 MG 12 hr tablet Take 1 tablet (10 mg total) by mouth every 12 (twelve) hours.  20 tablet  0  . polyethylene glycol (MIRALAX / GLYCOLAX) packet Take 17 g by mouth 2 (two) times daily.       Marland Kitchen saccharomyces boulardii (FLORASTOR) 250 MG capsule Take 1 capsule (250 mg total) by mouth 2 (two) times daily.      . simethicone (MYLICON) 80 MG chewable tablet Chew 80 mg by mouth every 6 (six) hours as needed for flatulence.      . torsemide (DEMADEX) 20 MG tablet Take 3 tablets (60 mg total) by mouth daily.      . traZODone (DESYREL) 50 MG tablet Take 50 mg by mouth at bedtime.      Marland Kitchen warfarin (COUMADIN) 4 MG tablet Take 4 mg by mouth daily.        Assessment: 79 yo female admitted with CHF, h/o Afib, to continue Coumadin  Goal of Therapy:  INR 2-3 Monitor platelets by anticoagulation protocol: Yes   Plan:  No Coumadin today Daily INR  Shooter Tangen, Gary Fleet 03/11/2013,3:33 AM

## 2013-03-11 NOTE — Progress Notes (Signed)
03/11/13 1100 Noted CM referral for Heart Failure Home Health Screen.  Pt. is from [facility], and will return there upon discharge.  CSW, Lupita Leash, made aware.  NCM will follow for further dc needs.  Tera Mater, RN, BSN NCM [phone]

## 2013-03-11 NOTE — Progress Notes (Signed)
PT will place on when ready. No distress noted. Pt encouraged to call RT if needed.

## 2013-03-11 NOTE — Progress Notes (Signed)
BP 120/67  Pulse 75  Temp(Src) 97.7 F (36.5 C) (Oral)  Resp 18  Ht 5\' 3"  (1.6 m)  Wt 102.1 kg (225 lb 1.4 oz)  BMI 39.88 kg/m2  SpO2 95% - I have review admission note and agree with plan. - b-met in am

## 2013-03-11 NOTE — H&P (Signed)
[facility] History and Physical  Gina Ashley EAV:[id number] DOB: 04-Jul-1927 DOA: 03/10/2013   PCP: Kimber Relic, MD   Chief Complaint: increasing abdominal girth  HPI:  79 year old female with a history of diastolic CHF, CKD stage IV, cardiorenal syndrome, hypertension,atrial fibrillation, and obstructive sleep apnea presents with increasing abdominal girth and lower extremity edema for the past 3-4 days. The patient was recently discharged from the hospital after a stay from 02/08/2013 to 02/12/2013 for CHF exacerbation. The patient was discharged to [facility] nursing facility. The patient has been using BiPAP in the evenings, but has not been able to use her machine for the last 2 nights because of some mechanical malfunction. The patient endorses compliance with her medications. Unfortunately, she states that over the past week she has noted worsening abdominal girth to the point that she cannot fit into her pants. In addition she has noted some increase in her bilateral lower extremity edema, left greater than right. She states that her breathing is not much worse than usual. She denies fevers, chills, chest discomfort, nausea, vomiting, diarrhea, abdominal pain, dysuria, hematuria. The patient normally wears 2 L nasal cannula 24 hours a day. At the nursing facility, the patient had a weight of 229 pounds this morning. She states that this is much worse than usual for her.  In the emergency department, the patient was noted to be hyponatremic with a sodium 127, serum creatinine 3.06, platelets 89,000. Her proBNP was 4061. Chest x-ray showed poor inspiration with interstitial prominence and cardiomegaly. EKG was atrial fibrillation rate controlled.  The patient was ordered to have 80 mg IV furosemide. Assessment/Plan: Acute on chronic diastolic CHF -the patient's weight was 214 pounds on the day of discharge on 02/12/2013 -She states that her weight was 229 pounds this morning -start  patient on furosemide 80 mg IV every 12 hours -The patient likely has a degree of right heart failure with cor pulmonale -Suspect that the patient's malfunction of her BiPAP may have also contributed to her decompensation -As her blood pressure was soft and emergency department, we'll hold off on BiDil at this time -Reduce her carvedilol dose to 12.5 mg twice a day due to her soft blood pressure -Daily weights and strict I.'s and O.'s -the patient was seen by the CHF team on last admission -although the patient's chest x-ray was interpreted as increasing right pleural effusion, the patient has poorer inspiratory effort CKD stage IV -Patient's baseline creatinine 2.6-2.9 -Patient has a degree of cardiorenal syndrome Hyponatremia -Likely due to the patient's heart failure -Continue diuresis Diabetes mellitus type 2 -Restart Lantus 5 units at bedtime -NovoLog sliding scale Bilateral lower extremities edema -venous duplex left lower extremity as edema is worse Atrial fibrillation -Rate controlled -Continue carvedilol -Pharmacy to assist with warfarin Obstructive sleep apnea -Restart CPAP at her previous settings CODE STATUS -Discussed with the patient's daughter at bedside and agreed upon DO NOT RESUSCITATE       Past Medical History  Diagnosis Date  . GERD (gastroesophageal reflux disease)   . Hiatal hernia   . HTN (hypertension)     all her life  . CKD (chronic kidney disease), stage III     GFR: 38  . Atrial fibrillation     since 1996  . Depression   . Chronic diastolic heart failure 08/31/2012  . Tibia fracture     BILATERAL  . Renal failure     acute on chronic   . Respiratory failure     acute  on chronic hx of  requiring BIPAP  . COPD (chronic obstructive pulmonary disease)   . Metabolic encephalopathy     hx of   . Renal failure     acute on chronic with need for intermittent dialysis - hx of   . UTI (urinary tract infection)     hx of   . Pleural  effusion     right sided now resolved   . CHF (congestive heart failure)     diastolic HF  . Complication of anesthesia     "hard to wake up" (03/11/2013)  . OSA on CPAP   . Type II diabetes mellitus 1980's?  . History of blood transfusion 08/2012    "related to leg OR" (03/11/2013)  . Arthritis     "left arm; lower back, hips, hands" (03/11/2013)  . Chronic lower back pain     "disc pops in and out" (03/11/2013)  . Gout   . Anxiety   . Fall 08/2012    "in nursing home" (03/11/2013)   Past Surgical History  Procedure Laterality Date  . Bunionectomy Bilateral     bilateral  . Cholecystectomy    . Shoulder arthroscopy w/ rotator cuff repair Left 2002  . Total knee arthroplasty Bilateral 2001  . Hammer toe surgery Bilateral 2004  . Colonoscopy  07/11/2011    Procedure: COLONOSCOPY;  Surgeon: Malissa Hippo, MD;  Location: [facility];  Service: Endoscopy;  Laterality: N/A;  3:00  . Orif periprosthetic fracture  09/08/2012    Procedure: OPEN REDUCTION INTERNAL FIXATION (ORIF) PERIPROSTHETIC FRACTURE;  Surgeon: Shelda Pal, MD;  Location: [facility];  Service: Orthopedics;  Laterality: Right;  ORIF periprosthetic right proximal femur fracture   . External fixation leg  09/08/2012    Procedure: EXTERNAL FIXATION LEG;  Surgeon: Shelda Pal, MD;  Location: [facility];  Service: Orthopedics;  Laterality: Left;  . Joint replacement    . External fixation leg  11/30/2012    Procedure: EXTERNAL FIXATION LEG;  Surgeon: Shelda Pal, MD;  Location: [facility];  Service: Orthopedics;  Laterality: Left;  Removal of External Fixation Left Knee with Evaluation with Floroscopy  . Tee without cardioversion N/A 01/19/2013    Procedure: TRANSESOPHAGEAL ECHOCARDIOGRAM (TEE);  Surgeon: Lewayne Bunting, MD;  Location: [facility] ENDOSCOPY;  Service: Cardiovascular;  Laterality: N/A;  . Shoulder open rotator cuff repair Left 2002    "maybe 3 months after scope" (03/11/2013)  . Femur fracture surgery Right 08/2012    "fell at  nursing home" (03/11/2013)  . Tibia fracture surgery Left 08/2012    "fell at nursing home" (03/11/2013)   Social History:  reports that she has never smoked. She has never used smokeless tobacco. She reports that she does not drink alcohol or use illicit drugs.   Family History  Problem Relation Age of Onset  . Colon cancer Brother      Allergies  Allergen Reactions  . Morphine Sulfate Other (See Comments)    REACTION: change in personality  . Remeron (Mirtazapine) Other (See Comments)    Altered mental status, lethargy  . Tuna (Fish Allergy) Other (See Comments)    unknown  . Penicillins Rash    Tolerates Ancef.      Prior to Admission medications   Medication Sig Start Date End Date Taking? Authorizing Provider  acetaminophen (TYLENOL) 325 MG tablet Take 650 mg by mouth every 6 (six) hours as needed for pain.   Yes Historical Provider, MD  albuterol (PROVENTIL) (  2.5 MG/3ML) 0.083% nebulizer solution Take 3 mLs (2.5 mg total) by nebulization every 2 (two) hours as needed for wheezing or shortness of breath. 02/12/13  Yes Shanker Levora Dredge, MD  allopurinol (ZYLOPRIM) 100 MG tablet Take 100 mg by mouth daily.   Yes Historical Provider, MD  ALPRAZolam (XANAX) 0.25 MG tablet Take 0.25-0.5 mg by mouth every 8 (eight) hours as needed for anxiety.    Yes Historical Provider, MD  amiodarone (PACERONE) 200 MG tablet Take 2 tablets (400 mg total) by mouth daily. 01/22/13  Yes Erick Blinks, MD  busPIRone (BUSPAR) 5 MG tablet Take 5 mg by mouth every 12 (twelve) hours.   Yes Historical Provider, MD  carvedilol (COREG) 25 MG tablet Take 25 mg by mouth 2 (two) times daily with a meal.   Yes Historical Provider, MD  diphenhydrAMINE (BENADRYL) 25 mg capsule Take 25 mg by mouth every 8 (eight) hours as needed for itching.   Yes Historical Provider, MD  docusate sodium (COLACE) 100 MG capsule Take 100 mg by mouth every 8 (eight) hours.    Yes Historical Provider, MD  guaiFENesin (MUCINEX) 600 MG 12  hr tablet Take 600 mg by mouth 2 (two) times daily.    Yes Historical Provider, MD  insulin aspart (NOVOLOG) 100 UNIT/ML injection Inject 3 Units into the skin 3 (three) times daily with meals. If cbg is greater than 150 09/15/12  Yes Lesle Chris Black, NP  insulin glargine (LANTUS) 100 UNIT/ML injection Inject 5 Units into the skin at bedtime.   Yes Historical Provider, MD  isosorbide-hydrALAZINE (BIDIL) 20-37.5 MG per tablet Take 1 tablet by mouth 2 (two) times daily. 01/22/13  Yes Erick Blinks, MD  Multiple Vitamin (MULTIVITAMIN WITH MINERALS) TABS Take 1 tablet by mouth daily.   Yes Historical Provider, MD  omeprazole (PRILOSEC) 20 MG capsule Take 40 mg by mouth daily.   Yes Historical Provider, MD  ondansetron (ZOFRAN) 4 MG tablet Take 1 tablet (4 mg total) by mouth every 6 (six) hours as needed for nausea. 02/12/13  Yes Shanker Levora Dredge, MD  oxyCODONE (OXY IR/ROXICODONE) 5 MG immediate release tablet Take 5 mg by mouth every 4 (four) hours as needed for pain (breakthrough).    Yes Historical Provider, MD  oxyCODONE (OXYCONTIN) 10 MG 12 hr tablet Take 1 tablet (10 mg total) by mouth every 12 (twelve) hours. 02/12/13  Yes Shanker Levora Dredge, MD  polyethylene glycol (MIRALAX / GLYCOLAX) packet Take 17 g by mouth 2 (two) times daily.    Yes Historical Provider, MD  saccharomyces boulardii (FLORASTOR) 250 MG capsule Take 1 capsule (250 mg total) by mouth 2 (two) times daily. 02/12/13  Yes Shanker Levora Dredge, MD  simethicone (MYLICON) 80 MG chewable tablet Chew 80 mg by mouth every 6 (six) hours as needed for flatulence.   Yes Historical Provider, MD  torsemide (DEMADEX) 20 MG tablet Take 3 tablets (60 mg total) by mouth daily. 01/22/13  Yes Erick Blinks, MD  traZODone (DESYREL) 50 MG tablet Take 50 mg by mouth at bedtime.   Yes Historical Provider, MD  warfarin (COUMADIN) 4 MG tablet Take 4 mg by mouth daily.   Yes Historical Provider, MD    Review of Systems:  Constitutional:  No weight loss, night  sweats, Fevers, chills, fatigue.  Head&Eyes: No headache.  No vision loss.  No eye pain or scotoma ENT:  No Difficulty swallowing,Tooth/dental problems,Sore throat,  No ear ache, post nasal drip,  Cardio-vascular:  No chest pain, Orthopnea, PND,  dizziness, palpitations  GI:  No  abdominal pain, nausea, vomiting, diarrhea, loss of appetite, hematochezia, melena, heartburn, indigestion, Resp:   No cough. No coughing up of blood .No wheezing.No chest wall deformity  Skin:  no rash or lesions.  GU:  no dysuria, change in color of urine, no urgency or frequency. No flank pain.  Musculoskeletal:  No joint pain or swelling. No decreased range of motion. No back pain.  Psych:  No change in mood or affect. No depression or anxiety. Neurologic: No headache, no dysesthesia, no focal weakness, no vision loss. No syncope  Physical Exam: Filed Vitals:   03/10/13 2200 03/10/13 2230 03/10/13 2300 03/10/13 2330  BP: 129/88 149/86 117/75 121/69  Pulse: 68 69 68 67  Temp:      TempSrc:      Resp: 15 16 13 12   SpO2: 93% 97% 96% 98%   General:  A&O x 3, NAD, nontoxic, pleasant/cooperative Head/Eye: No conjunctival hemorrhage, no icterus, [place]/AT, No nystagmus ENT:  No icterus,  No thrush,  no pharyngeal exudate Neck:  No masses, no lymphadenpathy, no bruits CV:  IRRR, no rub, no gallop Lung:  Bibasilar crackles left greater than right. No wheezes or rhonchi. Good air movement. Abdomen: soft/NT, +BS, nondistended, no peritoneal signs Ext: No cyanosis, No rashes, No petechiae, No lymphangitis, 2+right lower edema,3+ edema left lower extremity   Labs on Admission:  Basic Metabolic Panel:  Recent Labs Lab 03/10/13 2117  NA 127*  K 4.1  CL 82*  CO2 36*  GLUCOSE 204*  BUN 76*  CREATININE 3.06*  CALCIUM 9.4   Liver Function Tests:  Recent Labs Lab 03/10/13 2117  AST 19  ALT 11  ALKPHOS 121*  BILITOT 0.5  PROT 7.7  ALBUMIN 4.1   No results found for this basename: LIPASE,  AMYLASE,  in the last 168 hours No results found for this basename: AMMONIA,  in the last 168 hours CBC:  Recent Labs Lab 03/10/13 2117  WBC 5.9  HGB 9.9*  HCT 29.4*  MCV 83.1  PLT 89*   Cardiac Enzymes: No results found for this basename: CKTOTAL, CKMB, CKMBINDEX, TROPONINI,  in the last 168 hours BNP: No components found with this basename: POCBNP,  CBG: No results found for this basename: GLUCAP,  in the last 168 hours  Radiological Exams on Admission: Dg Chest 2 View  03/10/2013  *RADIOLOGY REPORT*  Clinical Data: Weakness and chest pain.  No shortness of breath.  CHEST - 2 VIEW  Comparison: 02/18/2013.  Findings: Cardiac enlargement.  Right base effusion appears increased.  Right basilar atelectasis.  No infiltrate or overt edema.  Degenerative changes thoracic spine.  IMPRESSION: Increased right pleural effusion.   Original Report Authenticated By: Davonna Belling, M.D.     EKG: Independently reviewed. Atrial fibrillation, no ST-T wave change, IVCD    Time spent:70 minutes Code Status:   DNR Family Communication:   Daughter at bedside   Gina Grieder, DO  [facility] Pager [phone]  If 7PM-7AM, please contact night-coverage [email or web address] Password [facility] 03/11/2013, 12:52 AM

## 2013-03-11 NOTE — Progress Notes (Signed)
Pt requesting some breathing tx.  Says she feel like she need a breathing tx.  Been taking one before.  02 Sat 95% on 2L.   Dr. Coralie Carpen notified and order placed and informed MD pt has no order to check blood sugar.  On coming nurse made aware.  Will continue to monitor.  Amanda Pea, Charity fundraiser.

## 2013-03-11 NOTE — Progress Notes (Signed)
Inpatient Diabetes Program Recommendations  AACE/ADA: New Consensus Statement on Inpatient Glycemic Control  Target Ranges:  Prepandial:   less than 140 mg/dL      Peak postprandial:   less than 180 mg/dL (1-2 hours)      Critically ill patients:  140 - 180 mg/dL  Pager:  [phone] Hours:  8 am-10pm   Reason for Visit: Elevated glucose and history of Diabetes  Inpatient Diabetes Program Recommendations Correction (SSI): Add Novolog Correction TID and Hs  Alfredia Client PhD, RN, BC-ADM Diabetes Coordinator  Office:  [phone] Team Pager:  [phone]

## 2013-03-12 ENCOUNTER — Encounter (HOSPITAL_COMMUNITY): Payer: Self-pay | Admitting: Internal Medicine

## 2013-03-12 DIAGNOSIS — R609 Edema, unspecified: Secondary | ICD-10-CM

## 2013-03-12 DIAGNOSIS — I10 Essential (primary) hypertension: Secondary | ICD-10-CM

## 2013-03-12 DIAGNOSIS — M79609 Pain in unspecified limb: Secondary | ICD-10-CM

## 2013-03-12 DIAGNOSIS — N189 Chronic kidney disease, unspecified: Secondary | ICD-10-CM | POA: Diagnosis present

## 2013-03-12 DIAGNOSIS — I1310 Hypertensive heart and chronic kidney disease without heart failure, with stage 1 through stage 4 chronic kidney disease, or unspecified chronic kidney disease: Secondary | ICD-10-CM

## 2013-03-12 DIAGNOSIS — E871 Hypo-osmolality and hyponatremia: Secondary | ICD-10-CM | POA: Diagnosis present

## 2013-03-12 DIAGNOSIS — I482 Chronic atrial fibrillation, unspecified: Secondary | ICD-10-CM

## 2013-03-12 DIAGNOSIS — N19 Unspecified kidney failure: Secondary | ICD-10-CM

## 2013-03-12 DIAGNOSIS — I5033 Acute on chronic diastolic (congestive) heart failure: Secondary | ICD-10-CM

## 2013-03-12 DIAGNOSIS — N179 Acute kidney failure, unspecified: Secondary | ICD-10-CM | POA: Diagnosis present

## 2013-03-12 HISTORY — DX: Chronic atrial fibrillation, unspecified: I48.20

## 2013-03-12 LAB — BASIC METABOLIC PANEL
BUN: 71 mg/dL — ABNORMAL HIGH (ref 6–23)
Chloride: 89 mEq/L — ABNORMAL LOW (ref 96–112)
Creatinine, Ser: 2.66 mg/dL — ABNORMAL HIGH (ref 0.50–1.10)
GFR calc Af Amer: 18 mL/min — ABNORMAL LOW (ref >90)
GFR calc non Af Amer: 16 mL/min — ABNORMAL LOW (ref >90)
Glucose, Bld: 150 mg/dL — ABNORMAL HIGH (ref 70–99)

## 2013-03-12 LAB — GLUCOSE, CAPILLARY
Glucose-Capillary: 144 mg/dL — ABNORMAL HIGH (ref 70–99)
Glucose-Capillary: 174 mg/dL — ABNORMAL HIGH (ref 70–99)

## 2013-03-12 MED ORDER — FUROSEMIDE 10 MG/ML IJ SOLN
15.0000 mg/h | INTRAVENOUS | Status: DC
  Administered 2013-03-12 – 2013-03-14 (×3): 15 mg/h via INTRAVENOUS
  Filled 2013-03-12 (×6): qty 25

## 2013-03-12 MED ORDER — WARFARIN SODIUM 4 MG PO TABS
4.0000 mg | ORAL_TABLET | Freq: Once | ORAL | Status: AC
  Administered 2013-03-12: 4 mg via ORAL
  Filled 2013-03-12: qty 1

## 2013-03-12 NOTE — Consult Note (Signed)
Advanced Heart Failure Team Consult Note  Referring Physician: Dr Janee Morn Primary Physician: Primary Cardiologist:  Dr Gala Romney  Reason for Consultation: Volume Overload   HPI:   The Heart Failure Team was asked to provide a consult by Dr Janee Morn for volume overload.  Gina Ashley is an 79 y.o. female with history of chronic atrial fibrillation on coumadin, diastolic heart failure and HTN. She also has DM type 2, COPD, OSA on CPAP, A Fib- chronic coumadin,  and CKD failure with baseline Cr 2.2. She has had 5 hospitalizations in the last 6 months, 1 included tibia/fibula fracture, 1 for UTI,  and the rest have been due to massive fluid overload in the setting of diastolic heart failure. Per nephrology she is not a candidate for HD.   Admitted March 2014 with massive volume overload. Weight 280 pounds. She required Milrinone and Dopamine. Permanent atrial fib and went for TEE/DCCV on 3/18 but unable to complete due to smoke. Continued on amio for rate control and coumadin. Evaluated by Nephrology due to renal failure and she was not a candidate for HD.  Discharged to SNF. Discharge weight  212 pounds.   She has been followed closely in the HF clinic with most recent OV 4/22 with stable volume status. She continued on home diuretic regimen 60 mg torsemide daily and Metolazone 5 mg daily.   She presented to [facility] ED 02/09/13 with increasing abdominal girth. Increased weight noted at SNF 229 pounds. (baseline weight 215 pounds). She was started on 80 mg IV lasix at every 12 hours. Pertinent admission labs: pro bnp 4061, Creatinine 3.06, potassium 4.1, and WBC 5.9. Diuresing well but incontinent.  Overnight weight down 1 pound. Denies SOB. +Orthopnea.     Review of Systems: [y] = yes, [ ]  = no   General: Weight gain [Y ]; Weight loss [ ] ; Anorexia [ ] ; Fatigue [ ] ; Fever [ ] ; Chills [ ] ; Weakness [ ]   Cardiac: Chest pain/pressure [ ] ; Resting SOB [ ] ; Exertional SOB [Y ]; Orthopnea [ Y]; Pedal  Edema [ Y]; Palpitations [ ] ; Syncope [ ] ; Presyncope [ ] ; Paroxysmal nocturnal dyspnea[ ]   Pulmonary: Cough [ ] ; Wheezing[ ] ; Hemoptysis[ ] ; Sputum [ ] ; Snoring [ ]   GI: Vomiting[ ] ; Dysphagia[ ] ; Melena[ ] ; Hematochezia [ ] ; Heartburn[ ] ; Abdominal pain [ ] ; Constipation [ ] ; Diarrhea [ ] ; BRBPR [ ]   GU: Hematuria[ ] ; Dysuria [ ] ; Nocturia[ ]   Vascular: Pain in legs with walking [ ] ; Pain in feet with lying flat [ ] ; Non-healing sores [ ] ; Stroke [ ] ; TIA [ ] ; Slurred speech [ ] ;  Neuro: Headaches[ ] ; Vertigo[ ] ; Seizures[ ] ; Paresthesias[ ] ;Blurred vision [ ] ; Diplopia [ ] ; Vision changes [ ]   Ortho/Skin: Arthritis [ Y]; Joint pain [Y ]; Muscle pain [ ] ; Joint swelling [ ] ; Back Pain [Y ]; Rash [ ]   Psych: Depression[ ] ; Anxiety[ ]   Heme: Bleeding problems [ ] ; Clotting disorders [ ] ; Anemia [ ]   Endocrine: Diabetes [ ] ; Thyroid dysfunction[ ]   Home Medications Prior to Admission medications   Medication Sig Start Date End Date Taking? Authorizing Provider  acetaminophen (TYLENOL) 325 MG tablet Take 650 mg by mouth every 6 (six) hours as needed for pain.   Yes Historical Provider, MD  albuterol (PROVENTIL) (2.5 MG/3ML) 0.083% nebulizer solution Take 3 mLs (2.5 mg total) by nebulization every 2 (two) hours as needed for wheezing or shortness of breath. 02/12/13  Yes Shanker Levora Dredge, MD  allopurinol (  ZYLOPRIM) 100 MG tablet Take 100 mg by mouth daily.   Yes Historical Provider, MD  ALPRAZolam (XANAX) 0.25 MG tablet Take 0.25-0.5 mg by mouth every 8 (eight) hours as needed for anxiety.    Yes Historical Provider, MD  amiodarone (PACERONE) 200 MG tablet Take 2 tablets (400 mg total) by mouth daily. 01/22/13  Yes Erick Blinks, MD  busPIRone (BUSPAR) 5 MG tablet Take 5 mg by mouth every 12 (twelve) hours.   Yes Historical Provider, MD  carvedilol (COREG) 25 MG tablet Take 25 mg by mouth 2 (two) times daily with a meal.   Yes Historical Provider, MD  diphenhydrAMINE (BENADRYL) 25 mg capsule Take  25 mg by mouth every 8 (eight) hours as needed for itching.   Yes Historical Provider, MD  docusate sodium (COLACE) 100 MG capsule Take 100 mg by mouth every 8 (eight) hours.    Yes Historical Provider, MD  guaiFENesin (MUCINEX) 600 MG 12 hr tablet Take 600 mg by mouth 2 (two) times daily.    Yes Historical Provider, MD  insulin aspart (NOVOLOG) 100 UNIT/ML injection Inject 3 Units into the skin 3 (three) times daily with meals. If cbg is greater than 150 09/15/12  Yes Lesle Chris Black, NP  insulin glargine (LANTUS) 100 UNIT/ML injection Inject 5 Units into the skin at bedtime.   Yes Historical Provider, MD  isosorbide-hydrALAZINE (BIDIL) 20-37.5 MG per tablet Take 1 tablet by mouth 2 (two) times daily. 01/22/13  Yes Erick Blinks, MD  Multiple Vitamin (MULTIVITAMIN WITH MINERALS) TABS Take 1 tablet by mouth daily.   Yes Historical Provider, MD  omeprazole (PRILOSEC) 20 MG capsule Take 40 mg by mouth daily.   Yes Historical Provider, MD  ondansetron (ZOFRAN) 4 MG tablet Take 1 tablet (4 mg total) by mouth every 6 (six) hours as needed for nausea. 02/12/13  Yes Shanker Levora Dredge, MD  oxyCODONE (OXY IR/ROXICODONE) 5 MG immediate release tablet Take 5 mg by mouth every 4 (four) hours as needed for pain (breakthrough).    Yes Historical Provider, MD  oxyCODONE (OXYCONTIN) 10 MG 12 hr tablet Take 1 tablet (10 mg total) by mouth every 12 (twelve) hours. 02/12/13  Yes Shanker Levora Dredge, MD  polyethylene glycol (MIRALAX / GLYCOLAX) packet Take 17 g by mouth 2 (two) times daily.    Yes Historical Provider, MD  saccharomyces boulardii (FLORASTOR) 250 MG capsule Take 1 capsule (250 mg total) by mouth 2 (two) times daily. 02/12/13  Yes Shanker Levora Dredge, MD  simethicone (MYLICON) 80 MG chewable tablet Chew 80 mg by mouth every 6 (six) hours as needed for flatulence.   Yes Historical Provider, MD  torsemide (DEMADEX) 20 MG tablet Take 3 tablets (60 mg total) by mouth daily. 01/22/13  Yes Erick Blinks, MD  traZODone  (DESYREL) 50 MG tablet Take 50 mg by mouth at bedtime.   Yes Historical Provider, MD  warfarin (COUMADIN) 4 MG tablet Take 4 mg by mouth daily.   Yes Historical Provider, MD    Past Medical History: Past Medical History  Diagnosis Date  . GERD (gastroesophageal reflux disease)   . Hiatal hernia   . HTN (hypertension)     all her life  . CKD (chronic kidney disease), stage III     GFR: 38  . Atrial fibrillation     since 1996  . Depression   . Chronic diastolic heart failure 08/31/2012  . Tibia fracture     BILATERAL  . Renal failure  acute on chronic   . Respiratory failure     acute on chronic hx of  requiring BIPAP  . COPD (chronic obstructive pulmonary disease)   . Metabolic encephalopathy     hx of   . Renal failure     acute on chronic with need for intermittent dialysis - hx of   . UTI (urinary tract infection)     hx of   . Pleural effusion     right sided now resolved   . CHF (congestive heart failure)     diastolic HF  . Complication of anesthesia     "hard to wake up" (03/11/2013)  . OSA on CPAP   . Type II diabetes mellitus 1980's?  . History of blood transfusion 08/2012    "related to leg OR" (03/11/2013)  . Arthritis     "left arm; lower back, hips, hands" (03/11/2013)  . Chronic lower back pain     "disc pops in and out" (03/11/2013)  . Gout   . Anxiety   . Fall 08/2012    "in nursing home" (03/11/2013)  . Anemia     Past Surgical History: Past Surgical History  Procedure Laterality Date  . Bunionectomy Bilateral     bilateral  . Cholecystectomy    . Shoulder arthroscopy w/ rotator cuff repair Left 2002  . Total knee arthroplasty Bilateral 2001  . Hammer toe surgery Bilateral 2004  . Colonoscopy  07/11/2011    Procedure: COLONOSCOPY;  Surgeon: Malissa Hippo, MD;  Location: [facility];  Service: Endoscopy;  Laterality: N/A;  3:00  . Orif periprosthetic fracture  09/08/2012    Procedure: OPEN REDUCTION INTERNAL FIXATION (ORIF) PERIPROSTHETIC  FRACTURE;  Surgeon: Shelda Pal, MD;  Location: [facility] ORS;  Service: Orthopedics;  Laterality: Right;  ORIF periprosthetic right proximal femur fracture   . External fixation leg  09/08/2012    Procedure: EXTERNAL FIXATION LEG;  Surgeon: Shelda Pal, MD;  Location: [facility] ORS;  Service: Orthopedics;  Laterality: Left;  . Joint replacement    . External fixation leg  11/30/2012    Procedure: EXTERNAL FIXATION LEG;  Surgeon: Shelda Pal, MD;  Location: [facility];  Service: Orthopedics;  Laterality: Left;  Removal of External Fixation Left Knee with Evaluation with Floroscopy  . Tee without cardioversion N/A 01/19/2013    Procedure: TRANSESOPHAGEAL ECHOCARDIOGRAM (TEE);  Surgeon: Lewayne Bunting, MD;  Location: [facility] ENDOSCOPY;  Service: Cardiovascular;  Laterality: N/A;  . Shoulder open rotator cuff repair Left 2002    "maybe 3 months after scope" (03/11/2013)  . Femur fracture surgery Right 08/2012    "fell at nursing home" (03/11/2013)  . Tibia fracture surgery Left 08/2012    "fell at nursing home" (03/11/2013)    Family History: Family History  Problem Relation Age of Onset  . Colon cancer Brother     Social History: History   Social History  . Marital Status: Widowed    Spouse Name: N/A    Number of Children: N/A  . Years of Education: N/A   Social History Main Topics  . Smoking status: Never Smoker   . Smokeless tobacco: Never Used  . Alcohol Use: No  . Drug Use: No  . Sexually Active: No   Other Topics Concern  . None   Social History Narrative  . None    Allergies:  Allergies  Allergen Reactions  . Morphine Sulfate Other (See Comments)    REACTION: change in personality  . Remeron (Mirtazapine)  Other (See Comments)    Altered mental status, lethargy  . Tuna (Fish Allergy) Other (See Comments)    unknown  . Penicillins Rash    Tolerates Ancef.    Objective:    Vital Signs:   Temp:  [97.8 F (36.6 C)-98 F (36.7 C)] 98 F (36.7 C) (05/09 0534) Pulse Rate:   [78-79] 79 (05/09 0534) Resp:  [14-18] 18 (05/09 0534) BP: (105-155)/(63-83) 105/63 mmHg (05/09 0534) SpO2:  [97 %-100 %] 100 % (05/09 0534) Weight:  [101.8 kg (224 lb 6.9 oz)] 101.8 kg (224 lb 6.9 oz) (05/09 0534) Last BM Date: 03/11/13  Weight change: Filed Weights   03/11/13 0204 03/11/13 0647 03/12/13 0534  Weight: 102.1 kg (225 lb 1.4 oz) 102.1 kg (225 lb 1.4 oz) 101.8 kg (224 lb 6.9 oz)    Intake/Output:   Intake/Output Summary (Last 24 hours) at 03/12/13 1004 Last data filed at 03/11/13 1834  Gross per 24 hour  Intake    480 ml  Output      1 ml  Net    479 ml     Physical Exam: General:  Elderly chronically ill appearing. No resp difficulty HEENT: normal Neck: supple. JVP 10-11 . Carotids 2+ bilat; no bruits. No lymphadenopathy or thryomegaly appreciated. Cor: PMI nondisplaced. Irregular rate & rhythm. No rubs, gallops or murmurs. Lungs: clear Abdomen: soft, nontender, nondistended. No hepatosplenomegaly. No bruits or masses. Good bowel sounds. Extremities: no cyanosis, clubbing, rash, RLE 2+ LLE 1+ edema in ankles Neuro: alert & orientedx3, cranial nerves grossly intact. moves all 4 extremities w/o difficulty. Affect pleasant  Telemetry: Afib  Labs: Basic Metabolic Panel:  Recent Labs Lab 03/10/13 2117 03/12/13 0530  NA 127* 136  K 4.1 3.6  CL 82* 89*  CO2 36* 38*  GLUCOSE 204* 150*  BUN 76* 71*  CREATININE 3.06* 2.66*  CALCIUM 9.4 9.3    Liver Function Tests:  Recent Labs Lab 03/10/13 2117  AST 19  ALT 11  ALKPHOS 121*  BILITOT 0.5  PROT 7.7  ALBUMIN 4.1   No results found for this basename: LIPASE, AMYLASE,  in the last 168 hours No results found for this basename: AMMONIA,  in the last 168 hours  CBC:  Recent Labs Lab 03/10/13 2117  WBC 5.9  HGB 9.9*  HCT 29.4*  MCV 83.1  PLT 89*    Cardiac Enzymes:  Recent Labs Lab 03/11/13 0215 03/11/13 0815  TROPONINI <0.30 <0.30    BNP: BNP (last 3 results)  Recent Labs   01/03/13 0800 02/08/13 2140 03/10/13 2117  PROBNP 4706.0* 3800.0* 4061.0*    CBG:  Recent Labs Lab 03/11/13 0233 03/11/13 2132 03/12/13 0619  GLUCAP 186* 232* 144*    Coagulation Studies:  Recent Labs  03/11/13 0215 03/12/13 0530  LABPROT 30.3* 26.5*  INR 3.10* 2.59*    Other results: EKG:  Imaging: Dg Chest 2 View  03/10/2013  *RADIOLOGY REPORT*  Clinical Data: Weakness and chest pain.  No shortness of breath.  CHEST - 2 VIEW  Comparison: 02/18/2013.  Findings: Cardiac enlargement.  Right base effusion appears increased.  Right basilar atelectasis.  No infiltrate or overt edema.  Degenerative changes thoracic spine.  IMPRESSION: Increased right pleural effusion.   Original Report Authenticated By: Davonna Belling, M.D.      Medications:     Current Medications: . allopurinol  100 mg Oral Daily  . amiodarone  400 mg Oral Daily  . busPIRone  5 mg Oral BID  . carvedilol  12.5 mg Oral BID WC  . docusate sodium  100 mg Oral Q8H  . insulin aspart  0-15 Units Subcutaneous TID WC  . insulin aspart  0-5 Units Subcutaneous QHS  . insulin glargine  5 Units Subcutaneous QHS  . multivitamin with minerals  1 tablet Oral Daily  . OxyCODONE  10 mg Oral BID  . pantoprazole  40 mg Oral Daily  . polyethylene glycol  17 g Oral BID  . saccharomyces boulardii  250 mg Oral BID  . sodium chloride  3 mL Intravenous Q12H  . traZODone  50 mg Oral QHS  . warfarin  4 mg Oral ONCE-1800  . Warfarin - Pharmacist Dosing Inpatient   Does not apply q1800    Infusions: . furosemide (LASIX) infusion       Assessment:  1. Acute on chronic right sided heart failure  2.  Acute on chronic renal failure.  3. HTN  4. Obesity, suspect OHS/OSA  5. Chronic atrial fib  - on coumadin  6. Deconditioning 7. DNR   Plan/Discussion:    Gina Ashley is well known to the HF team. She was admitted with volume overload that is complicated renal failure. Volume status only mildly elevated. Will stop  intermittent lasix and start lasix drip at 15 mg per hour. Will try to diurese as much as possible while watching renal function. Hopefully can get back to baseline of about 215 pounds in the next 1-2 days. If unable to diurese will need palliative care consult.  Per nephrology she is not a candidate for HD. Place TED hose.  I think part of her reason for frequent readmissions is that she is very unhappy at [facility] and prefers the care she gets in the hospital. I told her that this was not a tenable situation. Consult SW to see if there are other SNF as options that she might feel more comfortable at.   The HF team will see again Monday. Please call me with questions.   Length of Stay: 2   Truman Hayward 10:08 AM   Advanced Heart Failure Team Pager [phone] (M-F; 7a - 4p)  Please contact [facility] for night-coverage after hours (4p -7a ) and weekends on [email or web address]

## 2013-03-12 NOTE — Progress Notes (Signed)
ANTICOAGULATION CONSULT NOTE - Follow-up  Pharmacy Consult for Coumadin Indication: atrial fibrillation  Allergies  Allergen Reactions  . Morphine Sulfate Other (See Comments)    REACTION: change in personality  . Remeron (Mirtazapine) Other (See Comments)    Altered mental status, lethargy  . Tuna (Fish Allergy) Other (See Comments)    unknown  . Penicillins Rash    Tolerates Ancef.    Patient Measurements: Height: 5\' 3"  (160 cm) Weight: 224 lb 6.9 oz (101.8 kg) IBW/kg (Calculated) : 52.4  Vital Signs: Temp: 98 F (36.7 C) (05/09 0534) Temp src: Oral (05/09 0534) BP: 105/63 mmHg (05/09 0534) Pulse Rate: 79 (05/09 0534)  Labs:  Recent Labs  03/10/13 2117 03/11/13 0215 03/11/13 0815 03/12/13 0530  HGB 9.9*  --   --   --   HCT 29.4*  --   --   --   PLT 89*  --   --   --   LABPROT  --  30.3*  --  26.5*  INR  --  3.10*  --  2.59*  CREATININE 3.06*  --   --  2.66*  TROPONINI  --  <0.30 <0.30  --     Estimated Creatinine Clearance: 18.3 ml/min (by C-G formula based on Cr of 2.66).  Assessment: 78 yo female admitted with CHF continues on coumadin for afib. INR today is therapeutic at 2.59. H/H was 9.9/29.4 and plts were low at 89 as of 5/7. No bleeding noted.   Goal of Therapy:  INR 2-3   Plan:  1. Coumadin 4mg  PO x 1 tonight 2. F/u AM INR  Lysle Pearl, PharmD, BCPS Pager # [phone] 03/12/2013 9:08 AM

## 2013-03-12 NOTE — Progress Notes (Signed)
VASCULAR LAB PRELIMINARY  PRELIMINARY  PRELIMINARY  PRELIMINARY  Left lower extremity venous duplex completed.    Preliminary report:  Left:  No obvious evidence of DVT, superficial thrombosis, or Baker's cyst.  Mid to distal FV not adequately visualized.  Technically limited by body habitus.  Maryan Sivak, RVT 03/12/2013, 3:16 PM

## 2013-03-12 NOTE — Progress Notes (Signed)
[facility] PROGRESS NOTE  Gina Ashley YNW:[id number] DOB: 20-Dec-1926 DOA: 03/10/2013 PCP: Kimber Relic, MD  Assessment/Plan: #1 acute on chronic right-sided heart failure May be secondary to reason malfunction or BiPAP. Patient's weight last discharge was 215 pounds. Patient currently is 224 pounds. Patient on IV Lasix. Continue Coreg. By Will on hold secondary to borderline blood pressure. Cardiology has been consulted and is following.  #2 acute on chronic kidney disease Patient's baseline creatinine is approximately 2.2. Creatinine on admission was 3.06. Likely secondary to problem #1/cardiorenal syndrome. Renal function improved with diuresis. Continue diuresis as per cardiology. Will follow. Patient has been assessed by the renal team during her last hospitalization and is deemed not to be a candidate for hemodialysis. Follow.  #3 hypertension Patient blood pressure is borderline. Continue Coreg for now. Follow.  #4 hyponatremia Likely secondary to hypervolemic hyponatremia secondary to acute on chronic CHF exacerbation. Sodium levels are improving with diuresis. Continue diuresis and follow.  #5 atrial fibrillation Continue Coreg and amiodarone for rate control. Coumadin for anticoagulations.  #6 type 2 diabetes CBGs every from 144-189. Continue Lantus. Sliding scale insulin.  #7 obesity  #8 obstructive sleep apnea CPAP each bedtime  #9 prophylaxis On Coumadin for DVT prophylaxis.   Code Status: DO NOT RESUSCITATE Family Communication: Updated patient no family at bedside. Disposition Plan: To SNF when medically stable   Consultants:  Cardiology/heart failure team: Dr. Gala Romney 03/12/2013  Procedures:  Lower extremity Dopplers 03/12/2013  Chest x-ray 03/10/2013  Antibiotics:  None  HPI/Subjective: Patient states she's feeling much better than on admission. Patient states shortness of breath has improved.  Objective: Filed Vitals:   03/11/13  1436 03/11/13 2035 03/11/13 2310 03/12/13 0534  BP: 126/72 155/83  105/63  Pulse: 78 78  79  Temp: 97.8 F (36.6 C) 98 F (36.7 C)  98 F (36.7 C)  TempSrc: Oral Oral  Oral  Resp: 18 18 14 18   Height:      Weight:    101.8 kg (224 lb 6.9 oz)  SpO2: 97% 100%  100%    Intake/Output Summary (Last 24 hours) at 03/12/13 1651 Last data filed at 03/12/13 1419  Gross per 24 hour  Intake    720 ml  Output      0 ml  Net    720 ml   Filed Weights   03/11/13 0204 03/11/13 0647 03/12/13 0534  Weight: 102.1 kg (225 lb 1.4 oz) 102.1 kg (225 lb 1.4 oz) 101.8 kg (224 lb 6.9 oz)    Exam:   General:  NAD  Cardiovascular: Irregularly irregular.  Respiratory: Bibasilar crackles, no wheezing, no rhonchi  Abdomen: Soft/NT/ND/+BS  EXTREMITIES: No c/c. 2+ BLE edema   Data Reviewed: Basic Metabolic Panel:  Recent Labs Lab 03/10/13 2117 03/12/13 0530  NA 127* 136  K 4.1 3.6  CL 82* 89*  CO2 36* 38*  GLUCOSE 204* 150*  BUN 76* 71*  CREATININE 3.06* 2.66*  CALCIUM 9.4 9.3   Liver Function Tests:  Recent Labs Lab 03/10/13 2117  AST 19  ALT 11  ALKPHOS 121*  BILITOT 0.5  PROT 7.7  ALBUMIN 4.1   No results found for this basename: LIPASE, AMYLASE,  in the last 168 hours No results found for this basename: AMMONIA,  in the last 168 hours CBC:  Recent Labs Lab 03/10/13 2117  WBC 5.9  HGB 9.9*  HCT 29.4*  MCV 83.1  PLT 89*   Cardiac Enzymes:  Recent Labs Lab 03/11/13 0215 03/11/13  0815  TROPONINI <0.30 <0.30   BNP (last 3 results)  Recent Labs  01/03/13 0800 02/08/13 2140 03/10/13 2117  PROBNP 4706.0* 3800.0* 4061.0*   CBG:  Recent Labs Lab 03/11/13 0233 03/11/13 2132 03/12/13 0619 03/12/13 1056 03/12/13 1613  GLUCAP 186* 232* 144* 189* 174*    Recent Results (from the past 240 hour(s))  MRSA PCR SCREENING     Status: None   Collection Time    03/11/13  2:22 AM      Result Value Range Status   MRSA by PCR NEGATIVE  NEGATIVE Final    Comment:            The GeneXpert MRSA Assay (FDA     approved for NASAL specimens     only), is one component of a     comprehensive MRSA colonization     surveillance program. It is not     intended to diagnose MRSA     infection nor to guide or     monitor treatment for     MRSA infections.     Studies: Dg Chest 2 View  03/10/2013  *RADIOLOGY REPORT*  Clinical Data: Weakness and chest pain.  No shortness of breath.  CHEST - 2 VIEW  Comparison: 02/18/2013.  Findings: Cardiac enlargement.  Right base effusion appears increased.  Right basilar atelectasis.  No infiltrate or overt edema.  Degenerative changes thoracic spine.  IMPRESSION: Increased right pleural effusion.   Original Report Authenticated By: Davonna Belling, M.D.     Scheduled Meds: . allopurinol  100 mg Oral Daily  . amiodarone  400 mg Oral Daily  . busPIRone  5 mg Oral BID  . carvedilol  12.5 mg Oral BID WC  . docusate sodium  100 mg Oral Q8H  . insulin aspart  0-15 Units Subcutaneous TID WC  . insulin aspart  0-5 Units Subcutaneous QHS  . insulin glargine  5 Units Subcutaneous QHS  . multivitamin with minerals  1 tablet Oral Daily  . OxyCODONE  10 mg Oral BID  . pantoprazole  40 mg Oral Daily  . polyethylene glycol  17 g Oral BID  . saccharomyces boulardii  250 mg Oral BID  . sodium chloride  3 mL Intravenous Q12H  . traZODone  50 mg Oral QHS  . warfarin  4 mg Oral ONCE-1800  . Warfarin - Pharmacist Dosing Inpatient   Does not apply q1800   Continuous Infusions: . furosemide (LASIX) infusion 15 mg/hr (03/12/13 1149)    Principal Problem:   Acute on chronic diastolic heart failure Active Problems:   DM   OBSTRUCTIVE SLEEP APNEA   HYPERTENSION   GERD   Obesity (BMI 30-39.9)   Depression   Cardiorenal syndrome with renal failure   Renal failure (ARF), acute on chronic   Hyponatremia   Chronic a-fib    Time spent: > 35 mins    [facility]  [facility] Pager [phone]. If 7PM-7AM, please  contact night-coverage at [email or web address], password [facility] 03/12/2013, 4:51 PM  LOS: 2 days

## 2013-03-13 DIAGNOSIS — G4730 Sleep apnea, unspecified: Secondary | ICD-10-CM

## 2013-03-13 DIAGNOSIS — I4891 Unspecified atrial fibrillation: Secondary | ICD-10-CM

## 2013-03-13 LAB — CBC
HCT: 31.7 % — ABNORMAL LOW (ref 36.0–46.0)
Hemoglobin: 10.2 g/dL — ABNORMAL LOW (ref 12.0–15.0)
MCH: 27.8 pg (ref 26.0–34.0)
MCHC: 32.2 g/dL (ref 30.0–36.0)
RDW: 16.7 % — ABNORMAL HIGH (ref 11.5–15.5)

## 2013-03-13 LAB — GLUCOSE, CAPILLARY
Glucose-Capillary: 134 mg/dL — ABNORMAL HIGH (ref 70–99)
Glucose-Capillary: 190 mg/dL — ABNORMAL HIGH (ref 70–99)
Glucose-Capillary: 179 mg/dL — ABNORMAL HIGH (ref 70–99)

## 2013-03-13 LAB — PROTIME-INR: Prothrombin Time: 21.9 seconds — ABNORMAL HIGH (ref 11.6–15.2)

## 2013-03-13 LAB — BASIC METABOLIC PANEL
BUN: 60 mg/dL — ABNORMAL HIGH (ref 6–23)
Chloride: 89 mEq/L — ABNORMAL LOW (ref 96–112)
Creatinine, Ser: 1.86 mg/dL — ABNORMAL HIGH (ref 0.50–1.10)
GFR calc Af Amer: 28 mL/min — ABNORMAL LOW (ref >90)
GFR calc non Af Amer: 24 mL/min — ABNORMAL LOW (ref >90)

## 2013-03-13 MED ORDER — WARFARIN SODIUM 6 MG PO TABS
6.0000 mg | ORAL_TABLET | Freq: Once | ORAL | Status: AC
  Administered 2013-03-13: 6 mg via ORAL
  Filled 2013-03-13: qty 1

## 2013-03-13 MED ORDER — POTASSIUM CHLORIDE CRYS ER 20 MEQ PO TBCR
40.0000 meq | EXTENDED_RELEASE_TABLET | Freq: Once | ORAL | Status: AC
  Administered 2013-03-13: 40 meq via ORAL
  Filled 2013-03-13: qty 1

## 2013-03-13 NOTE — Progress Notes (Signed)
[facility] PROGRESS NOTE  Gina Ashley FAO:[id number] DOB: [birth year] DOA: 03/10/2013 PCP: Kimber Relic, MD  Assessment/Plan: #1 acute on chronic right-sided heart failure May be secondary to recent malfunction or BiPAP and dietary indiscretion at the skilled nursing facility as patient states that everybody eats the same diet.. Patient's weight last discharge was 215 pounds. Patient was 224 pounds yesterday and her current weight today is 217 pounds. Patient was placed on a Lasix drip per the heart failure team. Lasix may need to be switched back to oral dose will defer to the heart failure team.  Continue Coreg. Bidil on hold secondary to borderline blood pressure yesterday however blood pressure has improved. Will defer resumption of BiDil to cardiology. Cardiology following.  #2 acute on chronic kidney disease Patient's baseline creatinine is approximately 2.2. Creatinine on admission was 3.06. Likely secondary to problem #1/cardiorenal syndrome. Renal function improved with diuresis. Creatinine today is 1.86. Continue diuresis as per cardiology. Will follow. Patient has been assessed by the renal team during her last hospitalization and is deemed not to be a candidate for hemodialysis. Follow.  #3 hypertension Patient blood pressure improved. Continue Coreg for now. Bidil on hold.Follow.  #4 hyponatremia Likely secondary to hypervolemic hyponatremia secondary to acute on chronic CHF exacerbation. Sodium levels are improving with diuresis. Continue diuresis and follow.  #5 atrial fibrillation Continue Coreg and amiodarone for rate control. Coumadin for anticoagulation.  #6 type 2 diabetes CBGs every from 134-173. Continue Lantus. Sliding scale insulin.  #7 obesity  #8 hypokalemia Secondary to diureses. Replete.  #9 obstructive sleep apnea CPAP each bedtime  #10 prophylaxis On Coumadin for DVT prophylaxis.   Code Status: DO NOT RESUSCITATE Family Communication:  Updated patient no family at bedside. Disposition Plan: To SNF when medically stable   Consultants:  Cardiology/heart failure team: Dr. Gala Romney 03/12/2013  Procedures:  Lower extremity Dopplers 03/12/2013  Chest x-ray 03/10/2013  Antibiotics:  None  HPI/Subjective: Patient states she's feeling much better than on admission. Patient states shortness of breath has improved.  Objective: Filed Vitals:   03/12/13 0534 03/12/13 2006 03/12/13 2305 03/13/13 0555  BP: 105/63 122/63  148/74  Pulse: 79 68 83 68  Temp: 98 F (36.7 C) 97.8 F (36.6 C)  98 F (36.7 C)  TempSrc: Oral Oral  Oral  Resp: 18 18 16 18   Height:      Weight: 101.8 kg (224 lb 6.9 oz)   98.431 kg (217 lb)  SpO2: 100% 100%  100%    Intake/Output Summary (Last 24 hours) at 03/13/13 0907 Last data filed at 03/12/13 1850  Gross per 24 hour  Intake    480 ml  Output      0 ml  Net    480 ml   Filed Weights   03/11/13 0647 03/12/13 0534 03/13/13 0555  Weight: 102.1 kg (225 lb 1.4 oz) 101.8 kg (224 lb 6.9 oz) 98.431 kg (217 lb)    Exam:   General:  NAD  Cardiovascular: Irregularly irregular.  Respiratory: Bibasilar crackles, no wheezing, no rhonchi  Abdomen: Soft/NT/ND/+BS  EXTREMITIES: No c/c. 1-2+ BLE edema   Data Reviewed: Basic Metabolic Panel:  Recent Labs Lab 03/10/13 2117 03/12/13 0530 03/13/13 0742  NA 127* 136 135  K 4.1 3.6 3.4*  CL 82* 89* 89*  CO2 36* 38* 37*  GLUCOSE 204* 150* 126*  BUN 76* 71* 60*  CREATININE 3.06* 2.66* 1.86*  CALCIUM 9.4 9.3 9.6   Liver Function Tests:  Recent Labs Lab 03/10/13  2117  AST 19  ALT 11  ALKPHOS 121*  BILITOT 0.5  PROT 7.7  ALBUMIN 4.1   No results found for this basename: LIPASE, AMYLASE,  in the last 168 hours No results found for this basename: AMMONIA,  in the last 168 hours CBC:  Recent Labs Lab 03/10/13 2117 03/13/13 0742  WBC 5.9 5.3  HGB 9.9* 10.2*  HCT 29.4* 31.7*  MCV 83.1 86.4  PLT 89* 100*   Cardiac  Enzymes:  Recent Labs Lab 03/11/13 0215 03/11/13 0815  TROPONINI <0.30 <0.30   BNP (last 3 results)  Recent Labs  01/03/13 0800 02/08/13 2140 03/10/13 2117  PROBNP 4706.0* 3800.0* 4061.0*   CBG:  Recent Labs Lab 03/12/13 0619 03/12/13 1056 03/12/13 1613 03/12/13 2106 03/13/13 0645  GLUCAP 144* 189* 174* 173* 134*    Recent Results (from the past 240 hour(s))  MRSA PCR SCREENING     Status: None   Collection Time    03/11/13  2:22 AM      Result Value Range Status   MRSA by PCR NEGATIVE  NEGATIVE Final   Comment:            The GeneXpert MRSA Assay (FDA     approved for NASAL specimens     only), is one component of a     comprehensive MRSA colonization     surveillance program. It is not     intended to diagnose MRSA     infection nor to guide or     monitor treatment for     MRSA infections.     Studies: No results found.  Scheduled Meds: . allopurinol  100 mg Oral Daily  . amiodarone  400 mg Oral Daily  . busPIRone  5 mg Oral BID  . carvedilol  12.5 mg Oral BID WC  . docusate sodium  100 mg Oral Q8H  . insulin aspart  0-15 Units Subcutaneous TID WC  . insulin aspart  0-5 Units Subcutaneous QHS  . insulin glargine  5 Units Subcutaneous QHS  . multivitamin with minerals  1 tablet Oral Daily  . OxyCODONE  10 mg Oral BID  . pantoprazole  40 mg Oral Daily  . polyethylene glycol  17 g Oral BID  . potassium chloride  40 mEq Oral Once  . saccharomyces boulardii  250 mg Oral BID  . sodium chloride  3 mL Intravenous Q12H  . traZODone  50 mg Oral QHS  . Warfarin - Pharmacist Dosing Inpatient   Does not apply q1800   Continuous Infusions: . furosemide (LASIX) infusion 15 mg/hr (03/13/13 0711)    Principal Problem:   Acute on chronic diastolic heart failure Active Problems:   DM   OBSTRUCTIVE SLEEP APNEA   HYPERTENSION   GERD   Obesity (BMI 30-39.9)   Depression   Cardiorenal syndrome with renal failure   Renal failure (ARF), acute on chronic    Hyponatremia   Chronic a-fib    Time spent: > 35 mins    [facility]  [facility] Pager [phone]. If 7PM-7AM, please contact night-coverage at [email or web address], password [facility] 03/13/2013, 9:07 AM  LOS: 3 days

## 2013-03-13 NOTE — Procedures (Cosign Needed)
NAMEPAOLA, ALESHIRE               ACCOUNT NO.:  [id number]  MEDICAL RECORD NO.:  [id number]          PATIENT TYPE:  OUT  LOCATION:  SLEEP CENTER                 FACILITY:  [facility]  PHYSICIAN:  Janalynn Eder D. Maple Hudson, MD, FCCP, FACPDATE OF BIRTH:  21-Sep-1929  DATE OF STUDY:  03/13/2013                           NOCTURNAL POLYSOMNOGRAM  REFERRING PHYSICIAN:  REFERRING PHYSICIAN:  Jeoffrey Massed, MD.  INDICATION FOR STUDY:  Insomnia with sleep apnea.  EPWORTH SLEEPINESS SCORE:  12/24.  BMI 37.6, weight 212 pounds.  Height 63 inches.  MEDICATIONS:  Home medications are charted and reviewed.  SLEEP ARCHITECTURE:  Total sleep time 382 minutes with sleep efficiency 94.1%.  Stage I was 6%, stage II 88.7%, stage III absent, REM 5.2% of total sleep time.  Sleep latency 2 minutes, REM latency 20.5 minutes. Awake after sleep onset 16.5 minutes.  Arousal index 2.5.  Bedtime medication:  None.  RESPIRATORY DATA:  Apnea-hypotony index (AHI) 0.2 per hour.  A single obstructive apnea was recorded while supine.  There were not enough events to qualify for split protocol CPAP titration.  OXYGEN DATA:  Mild snoring with oxygen desaturation to a nadir of 93% and mean oxygen saturation through the study of 97.1% on room air.  CARDIAC DATA:  Atrial fibrillation with a mean heart rate 74.8 per minute.  MOVEMENT/PARASOMNIA:  No movement, disturbance of sleep.  The patient had a bladder catheter with no bathroom trips.  IMPRESSION/RECOMMENDATION: 1. Sleep pattern is unusual in its heavy preponderance of stage 2     sleep, in the absence of sleep medication. 2. Single obstructive apnea was recorded, within normal limits, AHI     0.2 per hour (the normal range for adult is     from 0-5 events per hour).  Mild snoring with oxygen desaturation     to a nadir of 93% and mean oxygen saturation through the study of     97.1% on room air.     Ayodele Hartsock D. Maple Hudson, MD, [facility], FACP Diplomate, American Board of  Sleep Medicine    CDY/MEDQ  D:  03/13/2013 12:56:20  T:  03/13/2013 19:30:06  Job:  [id number]

## 2013-03-13 NOTE — Progress Notes (Signed)
ANTICOAGULATION CONSULT NOTE - Follow-up  Pharmacy Consult for Coumadin Indication: atrial fibrillation  Allergies  Allergen Reactions  . Morphine Sulfate Other (See Comments)    REACTION: change in personality  . Remeron (Mirtazapine) Other (See Comments)    Altered mental status, lethargy  . Tuna (Fish Allergy) Other (See Comments)    unknown  . Penicillins Rash    Tolerates Ancef.    Patient Measurements: Height: 5\' 3"  (160 cm) Weight: 217 lb (98.431 kg) IBW/kg (Calculated) : 52.4  Vital Signs: Temp: 98 F (36.7 C) (05/10 0555) Temp src: Oral (05/10 0555) BP: 148/74 mmHg (05/10 0555) Pulse Rate: 68 (05/10 0555)  Labs:  Recent Labs  03/10/13 2117 03/11/13 0215 03/11/13 0815 03/12/13 0530 03/13/13 0742  HGB 9.9*  --   --   --  10.2*  HCT 29.4*  --   --   --  31.7*  PLT 89*  --   --   --  100*  LABPROT  --  30.3*  --  26.5* 21.9*  INR  --  3.10*  --  2.59* 2.00*  CREATININE 3.06*  --   --  2.66* 1.86*  TROPONINI  --  <0.30 <0.30  --   --     Estimated Creatinine Clearance: 25.6 ml/min (by C-G formula based on Cr of 1.86).  Home Medications: Warfarin 4mg  daily  Assessment: 79 yo female admitted with CHF continues on coumadin for afib. INR today is barely therapeutic at 2.0. Hg 10.2 and plts were low at 100. No bleeding noted. A dose was held due to 2 days prior and is why INR is down.  Goal of Therapy:  INR 2-3   Plan:  1. Coumadin 6mg  PO x 1 tonight 2. F/u AM INR   Vania Rea. Darin Engels.D. Clinical Pharmacist Pager [phone] Phone [phone] 03/13/2013 2:05 PM

## 2013-03-13 NOTE — Consult Note (Signed)
Advanced Heart Failure Team Consult Note  Referring Physician: Dr Janee Morn Primary Physician: Primary Cardiologist:  Dr Gala Romney  Reason for Consultation: Volume Overload   HPI:   The Heart Failure Team was asked to provide a consult by Dr Janee Morn for volume overload.  Gina Ashley is an 79 y.o. female with history of chronic atrial fibrillation on coumadin, diastolic heart failure and HTN. She also has DM type 2, COPD, OSA on CPAP, A Fib- chronic coumadin,  and CKD failure with baseline Cr 2.2. She has had 5 hospitalizations in the last 6 months, 1 included tibia/fibula fracture, 1 for UTI,  and the rest have been due to massive fluid overload in the setting of diastolic heart failure. Per nephrology she is not a candidate for HD.   Admitted March 2014 with massive volume overload. Weight 280 pounds. She required Milrinone and Dopamine. Permanent atrial fib and went for TEE/DCCV on 3/18 but unable to complete due to smoke. Continued on amio for rate control and coumadin. Evaluated by Nephrology due to renal failure and she was not a candidate for HD.  Discharged to SNF. Discharge weight  212 pounds.   She presented to [facility] ED 02/09/13 with increasing abdominal girth. Increased weight noted at SNF 229 pounds. (baseline weight 215 pounds).   Switched to lasix gtt 15/hr. Weight down 7 pounds to baseline 217. Renal function improved. Breathing better   Objective:    Vital Signs:   Temp:  [97.8 F (36.6 C)-98 F (36.7 C)] 98 F (36.7 C) (05/10 0555) Pulse Rate:  [68-83] 68 (05/10 0555) Resp:  [16-18] 18 (05/10 0555) BP: (122-148)/(63-74) 148/74 mmHg (05/10 0555) SpO2:  [100 %] 100 % (05/10 0555) Weight:  [98.431 kg (217 lb)] 98.431 kg (217 lb) (05/10 0555) Last BM Date: 03/12/13  Weight change: Filed Weights   03/11/13 0647 03/12/13 0534 03/13/13 0555  Weight: 102.1 kg (225 lb 1.4 oz) 101.8 kg (224 lb 6.9 oz) 98.431 kg (217 lb)    Intake/Output:   Intake/Output Summary (Last 24  hours) at 03/13/13 0940 Last data filed at 03/12/13 1850  Gross per 24 hour  Intake    480 ml  Output      0 ml  Net    480 ml     Physical Exam: General:  Elderly chronically ill appearing. No resp difficulty HEENT: normal Neck: supple. JVP 10 . Carotids 2+ bilat; no bruits. No lymphadenopathy or thryomegaly appreciated. Cor: PMI nondisplaced. Irregular rate & rhythm. No rubs, gallops or murmurs. Lungs: clear Abdomen: soft, nontender, nondistended. No hepatosplenomegaly. No bruits or masses. Good bowel sounds. Extremities: no cyanosis, clubbing, rash, RLE 1+ LLE TR edema in ankles Neuro: alert & orientedx3, cranial nerves grossly intact. moves all 4 extremities w/o difficulty. Affect pleasant  Telemetry: Afib  Labs: Basic Metabolic Panel:  Recent Labs Lab 03/10/13 2117 03/12/13 0530 03/13/13 0742  NA 127* 136 135  K 4.1 3.6 3.4*  CL 82* 89* 89*  CO2 36* 38* 37*  GLUCOSE 204* 150* 126*  BUN 76* 71* 60*  CREATININE 3.06* 2.66* 1.86*  CALCIUM 9.4 9.3 9.6    Liver Function Tests:  Recent Labs Lab 03/10/13 2117  AST 19  ALT 11  ALKPHOS 121*  BILITOT 0.5  PROT 7.7  ALBUMIN 4.1   No results found for this basename: LIPASE, AMYLASE,  in the last 168 hours No results found for this basename: AMMONIA,  in the last 168 hours  CBC:  Recent Labs Lab 03/10/13 2117 03/13/13  0742  WBC 5.9 5.3  HGB 9.9* 10.2*  HCT 29.4* 31.7*  MCV 83.1 86.4  PLT 89* 100*    Cardiac Enzymes:  Recent Labs Lab 03/11/13 0215 03/11/13 0815  TROPONINI <0.30 <0.30    BNP: BNP (last 3 results)  Recent Labs  02/08/13 2140 03/10/13 2117 03/13/13 0747  PROBNP 3800.0* 4061.0* 4669.0*    CBG:  Recent Labs Lab 03/12/13 0619 03/12/13 1056 03/12/13 1613 03/12/13 2106 03/13/13 0645  GLUCAP 144* 189* 174* 173* 134*    Coagulation Studies:  Recent Labs  03/11/13 0215 03/12/13 0530 03/13/13 0742  LABPROT 30.3* 26.5* 21.9*  INR 3.10* 2.59* 2.00*    Other  results: EKG:  Imaging: No results found.   Medications:     Current Medications: . allopurinol  100 mg Oral Daily  . amiodarone  400 mg Oral Daily  . busPIRone  5 mg Oral BID  . carvedilol  12.5 mg Oral BID WC  . docusate sodium  100 mg Oral Q8H  . insulin aspart  0-15 Units Subcutaneous TID WC  . insulin aspart  0-5 Units Subcutaneous QHS  . insulin glargine  5 Units Subcutaneous QHS  . multivitamin with minerals  1 tablet Oral Daily  . OxyCODONE  10 mg Oral BID  . pantoprazole  40 mg Oral Daily  . polyethylene glycol  17 g Oral BID  . potassium chloride  40 mEq Oral Once  . saccharomyces boulardii  250 mg Oral BID  . sodium chloride  3 mL Intravenous Q12H  . traZODone  50 mg Oral QHS  . Warfarin - Pharmacist Dosing Inpatient   Does not apply q1800    Infusions: . furosemide (LASIX) infusion 15 mg/hr (03/13/13 0711)     Assessment:  1. Acute on chronic right sided heart failure  2.  Acute on chronic renal failure.  3. HTN  4. Obesity, suspect OHS/OSA  5. Chronic atrial fib  - on coumadin  6. Deconditioning 7. DNR   Plan/Discussion:    Weight back to baseline but neck veins still up. Renal function improved. Would continue lasix drip at least one more day. If renal function starts to rise can stop tomorrow.  SW looking into another SNF as she is not happy with [facility].  She remains DNR. May be reasonable to have Palliative Care team see.   The HF team will see again Monday. Please call me with questions.   Length of Stay: 3   Daniel Bensimhon,MD 9:40 AM   Advanced Heart Failure Team Pager [phone] (M-F; 7a - 4p)  Please contact [facility] for night-coverage after hours (4p -7a ) and weekends on [email or web address]

## 2013-03-14 DIAGNOSIS — I509 Heart failure, unspecified: Secondary | ICD-10-CM

## 2013-03-14 DIAGNOSIS — E876 Hypokalemia: Secondary | ICD-10-CM

## 2013-03-14 LAB — GLUCOSE, CAPILLARY
Glucose-Capillary: 116 mg/dL — ABNORMAL HIGH (ref 70–99)
Glucose-Capillary: 146 mg/dL — ABNORMAL HIGH (ref 70–99)

## 2013-03-14 LAB — BASIC METABOLIC PANEL
BUN: 57 mg/dL — ABNORMAL HIGH (ref 6–23)
CO2: 41 mEq/L (ref 19–32)
Chloride: 89 mEq/L — ABNORMAL LOW (ref 96–112)
Glucose, Bld: 116 mg/dL — ABNORMAL HIGH (ref 70–99)
Potassium: 3.2 mEq/L — ABNORMAL LOW (ref 3.5–5.1)
Sodium: 137 mEq/L (ref 135–145)

## 2013-03-14 LAB — PROTIME-INR: INR: 1.96 — ABNORMAL HIGH (ref 0.00–1.49)

## 2013-03-14 MED ORDER — ISOSORB DINITRATE-HYDRALAZINE 20-37.5 MG PO TABS
1.0000 | ORAL_TABLET | Freq: Two times a day (BID) | ORAL | Status: DC
  Administered 2013-03-14 – 2013-03-17 (×7): 1 via ORAL
  Filled 2013-03-14 (×8): qty 1

## 2013-03-14 MED ORDER — WARFARIN SODIUM 6 MG PO TABS
6.0000 mg | ORAL_TABLET | Freq: Once | ORAL | Status: AC
  Administered 2013-03-14: 6 mg via ORAL
  Filled 2013-03-14: qty 1

## 2013-03-14 MED ORDER — FUROSEMIDE 40 MG PO TABS
60.0000 mg | ORAL_TABLET | Freq: Every day | ORAL | Status: DC
  Administered 2013-03-14: 60 mg via ORAL
  Filled 2013-03-14 (×2): qty 1

## 2013-03-14 MED ORDER — POTASSIUM CHLORIDE CRYS ER 20 MEQ PO TBCR
40.0000 meq | EXTENDED_RELEASE_TABLET | ORAL | Status: AC
  Administered 2013-03-14 (×2): 40 meq via ORAL
  Filled 2013-03-14 (×2): qty 2

## 2013-03-14 NOTE — Progress Notes (Signed)
Patient places self on CPAP at night. NO issue machine set and ready to be used. RN aware

## 2013-03-14 NOTE — Progress Notes (Signed)
CRITICAL VALUE ALERT  Critical value received:  CO2=41   Date of notification:  03/14/13   Time of notification:  0820  Critical value read back:yes  Nurse who received alert:  Juliane Lack, RN  MD notified (1st page):  Dr Janee Morn  Time of first page:  [phone]  MD notified (2nd page):  Time of second page:  Responding MD:  Dr Janee Morn   Time MD responded:  [phone]

## 2013-03-14 NOTE — Progress Notes (Signed)
Occupational Therapy Discharge Patient Details Name: Gina Ashley MRN: [id number] DOB: 07-31-1927 Today's Date: 03/14/2013 Time:  -     Patient discharged from OT services secondary to pt from SNF and plan is to go back to SNF. Defer eval to that facility as they deem appropriate. Acute OT will sign off.[name] [phone] 03/14/2013, 11:59 AM

## 2013-03-14 NOTE — Evaluation (Signed)
Physical Therapy Evaluation Patient Details Name: Gina Ashley MRN: [id number] DOB: [birth year] Today's Date: 03/14/2013 Time: 4098-1191 PT Time Calculation (min): 30 min  PT Assessment / Plan / Recommendation Clinical Impression  Pt admitted for CHF and presents with decreased strength, balance, mobility and activity tolerance.  Pt will benefit from skilled PT services to address deficits and increase functional independence and decrease burden of care.    PT Assessment  Patient needs continued PT services    Follow Up Recommendations  SNF    Does the patient have the potential to tolerate intense rehabilitation      Barriers to Discharge        Equipment Recommendations       Recommendations for Other Services     Frequency Min 3X/week    Precautions / Restrictions Precautions Precautions: Fall   Pertinent Vitals/Pain Pt c/o 3/10 back pain, RN made aware.  Pt on 2L O2, spO2 95% during treatment      Mobility  Bed Mobility Supine to Sit: 5: Supervision;HOB elevated;With rails Transfers Transfers: Sit to Stand;Stand to Sit Sit to Stand: 3: Mod assist;With upper extremity assist;From bed Stand to Sit: 4: Min assist Details for Transfer Assistance: Multiple attempts at sit to stand, pt limited by anxiety, tends to fall posteriorly.  Pt requires manual facilitation for anterior wt shifts and lifting assist at hips.  Pt requires cues for safe UE and LE placement with decreased wt bearing on L LE.  Pt able to stand x 15 seconds multiple attempts before needing seated rest. Ambulation/Gait Stairs: No Wheelchair Mobility Wheelchair Mobility: No    Exercises     PT Diagnosis: Difficulty walking;Generalized weakness  PT Problem List: Decreased strength;Decreased range of motion;Decreased activity tolerance;Decreased balance;Decreased mobility;Cardiopulmonary status limiting activity;Decreased knowledge of use of DME;Pain PT Treatment Interventions: DME instruction;Gait  training;Patient/family education;Stair training;Wheelchair mobility training;Functional mobility training;Manual techniques;Modalities;Therapeutic activities;Therapeutic exercise;Balance training;Neuromuscular re-education   PT Goals Acute Rehab PT Goals PT Goal Formulation: With patient Time For Goal Achievement: 03/21/13 Potential to Achieve Goals: Good Pt will go Supine/Side to Sit: with supervision Pt will Transfer Bed to Chair/Chair to Bed: with supervision Pt will Stand: with supervision;3 - 5 min;with unilateral upper extremity support  Visit Information  Last PT Received On: 03/14/13 Assistance Needed: +1    Subjective Data  Subjective: I've been at rehab before this Patient Stated Goal: I want to be able to get out of bed and walk   Prior Functioning  Home Living Lives With: Other (Comment) Available Help at Discharge: Skilled Nursing Facility Type of Home: Skilled Nursing Facility Home Access: Level entry Home Layout: One level Prior Function Level of Independence: Needs assistance    Cognition  Cognition Arousal/Alertness: Awake/alert Behavior During Therapy: WFL for tasks assessed/performed Overall Cognitive Status: Within Functional Limits for tasks assessed    Extremity/Trunk Assessment Right Lower Extremity Assessment RLE ROM/Strength/Tone: Within functional levels RLE Coordination: WFL - gross/fine motor Left Lower Extremity Assessment LLE ROM/Strength/Tone: Deficits LLE ROM/Strength/Tone Deficits: ankle 0/5, knee 2/5, decreased AROM hip and knee Trunk Assessment Trunk Assessment: Normal   Balance Static Standing Balance Static Standing - Balance Support: Bilateral upper extremity supported Static Standing - Level of Assistance: 4: Min assist Static Standing - Comment/# of Minutes: 15 seconds with facilitation for anterior wt shifting  End of Session PT - End of Session Equipment Utilized During Treatment: Gait belt;Oxygen Activity Tolerance: Patient  tolerated treatment well Patient left: in bed;with call bell/phone within reach;with nursing in room Nurse Communication: Mobility status  GP     Gina Ashley 03/14/2013, 9:45 AM

## 2013-03-14 NOTE — Progress Notes (Signed)
[facility] PROGRESS NOTE  Gina Ashley ZOX:[id number] DOB: 11-Apr-1927 DOA: 03/10/2013 PCP: Kimber Relic, MD  Assessment/Plan: #1 acute on chronic right-sided heart failure May be secondary to recent malfunction or BiPAP and dietary indiscretion at the skilled nursing facility as patient states that everybody eats the same diet.. Patient's weight last discharge was 215 pounds. Patient was 224 pounds 2 days ago, was 217 yesterday and 211 pounds today. Patient was placed on a Lasix drip per the heart failure team. Creatinine slightly increased today. Will d/c lasix drip and resume home dose lasix 60mg  daily. Continue Coreg.  Resume Bidil. Cardiology following.  #2 acute on chronic kidney disease Patient's baseline creatinine is approximately 2.2. Creatinine on admission was 3.06. Likely secondary to problem #1/cardiorenal syndrome. Renal function improved with diuresis. Creatinine today is 1.98. Continue diuresis as per cardiology. Will follow. Patient has been assessed by the renal team during her last hospitalization and is deemed not to be a candidate for hemodialysis. Follow.  #3 hypertension Patient blood pressure improved. Continue Coreg for now. Resume Bidil .Follow.  #4 hyponatremia Likely secondary to hypervolemic hyponatremia secondary to acute on chronic CHF exacerbation. Sodium levels are improved with diuresis. Continue diuresis and follow.  #5 atrial fibrillation Continue Coreg and amiodarone for rate control. Coumadin for anticoagulation.  #6 type 2 diabetes CBGs every from 179-190. Continue Lantus. Sliding scale insulin.  #7 obesity  #8 hypokalemia Secondary to diureses. Replete.  #9 obstructive sleep apnea CPAP each bedtime  #10 prophylaxis On Coumadin for DVT prophylaxis.   Code Status: DO NOT RESUSCITATE Family Communication: Updated patient no family at bedside. Disposition Plan: To SNF when medically stable   Consultants:  Cardiology/heart failure  team: Dr. Gala Romney 03/12/2013  Procedures:  Lower extremity Dopplers 03/12/2013  Chest x-ray 03/10/2013  Antibiotics:  None  HPI/Subjective: Patient states she's feeling much better than on admission. Patient states shortness of breath has improved.  Objective: Filed Vitals:   03/13/13 0555 03/13/13 2102 03/14/13 0555 03/14/13 0910  BP: 148/74 139/76 139/78 127/78  Pulse: 68 90 96 98  Temp: 98 F (36.7 C) 97.8 F (36.6 C) 98.1 F (36.7 C) 97.8 F (36.6 C)  TempSrc: Oral Oral Oral Oral  Resp: 18 18 18 18   Height:      Weight: 98.431 kg (217 lb)  95.8 kg (211 lb 3.2 oz)   SpO2: 100% 100% 100% 100%    Intake/Output Summary (Last 24 hours) at 03/14/13 0920 Last data filed at 03/14/13 0917  Gross per 24 hour  Intake    243 ml  Output      0 ml  Net    243 ml   Filed Weights   03/12/13 0534 03/13/13 0555 03/14/13 0555  Weight: 101.8 kg (224 lb 6.9 oz) 98.431 kg (217 lb) 95.8 kg (211 lb 3.2 oz)    Exam:   General:  NAD  Cardiovascular: Irregularly irregular.  Respiratory: clear, no wheezing, no rhonchi  Abdomen: Soft/NT/ND/+BS  EXTREMITIES: No c/c. 1+ BLE edema   Data Reviewed: Basic Metabolic Panel:  Recent Labs Lab 03/10/13 2117 03/12/13 0530 03/13/13 0742 03/14/13 0435  NA 127* 136 135 137  K 4.1 3.6 3.4* 3.2*  CL 82* 89* 89* 89*  CO2 36* 38* 37* 41*  GLUCOSE 204* 150* 126* 116*  BUN 76* 71* 60* 57*  CREATININE 3.06* 2.66* 1.86* 1.98*  CALCIUM 9.4 9.3 9.6 9.8   Liver Function Tests:  Recent Labs Lab 03/10/13 2117  AST 19  ALT 11  ALKPHOS 121*  BILITOT 0.5  PROT 7.7  ALBUMIN 4.1   No results found for this basename: LIPASE, AMYLASE,  in the last 168 hours No results found for this basename: AMMONIA,  in the last 168 hours CBC:  Recent Labs Lab 03/10/13 2117 03/13/13 0742  WBC 5.9 5.3  HGB 9.9* 10.2*  HCT 29.4* 31.7*  MCV 83.1 86.4  PLT 89* 100*   Cardiac Enzymes:  Recent Labs Lab 03/11/13 0215 03/11/13 0815   TROPONINI <0.30 <0.30   BNP (last 3 results)  Recent Labs  02/08/13 2140 03/10/13 2117 03/13/13 0747  PROBNP 3800.0* 4061.0* 4669.0*   CBG:  Recent Labs Lab 03/12/13 2106 03/13/13 0645 03/13/13 1231 03/13/13 1710 03/13/13 2104  GLUCAP 173* 134* 176* 190* 179*    Recent Results (from the past 240 hour(s))  MRSA PCR SCREENING     Status: None   Collection Time    03/11/13  2:22 AM      Result Value Range Status   MRSA by PCR NEGATIVE  NEGATIVE Final   Comment:            The GeneXpert MRSA Assay (FDA     approved for NASAL specimens     only), is one component of a     comprehensive MRSA colonization     surveillance program. It is not     intended to diagnose MRSA     infection nor to guide or     monitor treatment for     MRSA infections.     Studies: No results found.  Scheduled Meds: . allopurinol  100 mg Oral Daily  . amiodarone  400 mg Oral Daily  . busPIRone  5 mg Oral BID  . carvedilol  12.5 mg Oral BID WC  . docusate sodium  100 mg Oral Q8H  . furosemide  60 mg Oral Daily  . insulin aspart  0-15 Units Subcutaneous TID WC  . insulin aspart  0-5 Units Subcutaneous QHS  . insulin glargine  5 Units Subcutaneous QHS  . multivitamin with minerals  1 tablet Oral Daily  . OxyCODONE  10 mg Oral BID  . pantoprazole  40 mg Oral Daily  . polyethylene glycol  17 g Oral BID  . potassium chloride  40 mEq Oral Q4H  . saccharomyces boulardii  250 mg Oral BID  . sodium chloride  3 mL Intravenous Q12H  . traZODone  50 mg Oral QHS  . Warfarin - Pharmacist Dosing Inpatient   Does not apply q1800   Continuous Infusions:    Principal Problem:   Acute on chronic diastolic heart failure Active Problems:   DM   OBSTRUCTIVE SLEEP APNEA   HYPERTENSION   GERD   Obesity (BMI 30-39.9)   Depression   Cardiorenal syndrome with renal failure   Renal failure (ARF), acute on chronic   Hyponatremia   Chronic a-fib    Time spent: > 35  mins    [facility]  [facility] Pager [phone]. If 7PM-7AM, please contact night-coverage at [email or web address], password [facility] 03/14/2013, 9:20 AM  LOS: 4 days

## 2013-03-14 NOTE — Plan of Care (Signed)
Problem: Phase I Progression Outcomes Goal: EF % per last Echo/documented,Core Reminder form on chart Outcome: Completed/Met Date Met:  03/14/13 Ef 55%

## 2013-03-14 NOTE — Progress Notes (Signed)
ANTICOAGULATION CONSULT NOTE - Follow-up  Pharmacy Consult for Coumadin Indication: atrial fibrillation  Allergies  Allergen Reactions  . Morphine Sulfate Other (See Comments)    REACTION: change in personality  . Remeron (Mirtazapine) Other (See Comments)    Altered mental status, lethargy  . Tuna (Fish Allergy) Other (See Comments)    unknown  . Penicillins Rash    Tolerates Ancef.    Patient Measurements: Height: 5\' 3"  (160 cm) Weight: 211 lb 3.2 oz (95.8 kg) (bed ) IBW/kg (Calculated) : 52.4  Vital Signs: Temp: 97.8 F (36.6 C) (05/11 0910) Temp src: Oral (05/11 0910) BP: 127/78 mmHg (05/11 0910) Pulse Rate: 98 (05/11 0910)  Labs:  Recent Labs  03/12/13 0530 03/13/13 0742 03/14/13 0435  HGB  --  10.2*  --   HCT  --  31.7*  --   PLT  --  100*  --   LABPROT 26.5* 21.9* 21.6*  INR 2.59* 2.00* 1.96*  CREATININE 2.66* 1.86* 1.98*    Estimated Creatinine Clearance: 23.7 ml/min (by C-G formula based on Cr of 1.98).  Home Medications: Warfarin 4mg  daily  Assessment: 79 yo female admitted with CHF continues on coumadin for afib. INR today is slightly subtherapeutic at 1.96. Hg 10.2 and plts were low at 100. No bleeding noted. A dose was held due to previously elevated INR and is why INR is down now.  Goal of Therapy:  INR 2-3   Plan:  1. Coumadin 6mg  PO x 1 tonight 2. F/u AM INR   Vania Rea. Darin Engels.D. Clinical Pharmacist Pager [phone] Phone [phone] 03/14/2013 1:28 PM

## 2013-03-15 DIAGNOSIS — N189 Chronic kidney disease, unspecified: Secondary | ICD-10-CM

## 2013-03-15 DIAGNOSIS — N179 Acute kidney failure, unspecified: Secondary | ICD-10-CM

## 2013-03-15 LAB — GLUCOSE, CAPILLARY
Glucose-Capillary: 212 mg/dL — ABNORMAL HIGH (ref 70–99)
Glucose-Capillary: 105 mg/dL — ABNORMAL HIGH (ref 70–99)

## 2013-03-15 LAB — BASIC METABOLIC PANEL
CO2: 41 mEq/L (ref 19–32)
Calcium: 9.8 mg/dL (ref 8.4–10.5)
Potassium: 4.3 mEq/L (ref 3.5–5.1)
Sodium: 137 mEq/L (ref 135–145)

## 2013-03-15 LAB — PROTIME-INR: INR: 2.05 — ABNORMAL HIGH (ref 0.00–1.49)

## 2013-03-15 MED ORDER — POLYETHYLENE GLYCOL 3350 17 G PO PACK: 17.0000 g | PACK | Freq: Two times a day (BID) | ORAL | Status: DC

## 2013-03-15 MED ORDER — BISACODYL 10 MG RE SUPP
10.0000 mg | Freq: Every day | RECTAL | Status: DC | PRN
  Administered 2013-03-15: 10 mg via RECTAL
  Filled 2013-03-15: qty 1

## 2013-03-15 MED ORDER — WARFARIN SODIUM 6 MG PO TABS
6.0000 mg | ORAL_TABLET | Freq: Once | ORAL | Status: AC
  Administered 2013-03-15: 6 mg via ORAL
  Filled 2013-03-15: qty 1

## 2013-03-15 NOTE — Progress Notes (Signed)
Occupational Therapy Discharge Patient Details Name: Gina Ashley MRN: [id number] DOB: Mar 01, 1927 Today's Date: 03/15/2013 Time:  -     Patient discharged from OT services secondary to had already deferred eval back to SNF and now have a re-order perhaps since pt's daughter wanted potentially CIR. Spoke to CM who states CIR said no and so plan is SNF. OT eval will be deferred to that facility. Acute OT will sign off.           [name] [phone] 03/15/2013, 3:26 PM

## 2013-03-15 NOTE — Progress Notes (Signed)
Physical medicine rehabilitation consult requested in chart reviewed. Patient presently resides at [facility] nursing facility after recent discharge 02/12/2013 for CHF exacerbation. Physical therapy evaluation completed 03/14/2013 with recommendations to return a skilled nursing facility. At this time will hold formal rehabilitation consult at this time pending discharge plan

## 2013-03-15 NOTE — Clinical Social Work Placement (Signed)
Clinical Social Work Department CLINICAL SOCIAL WORK PLACEMENT NOTE 03/15/2013  Patient:  CAIDYNCE, MUZYKA  Account Number:  [id number] Admit date:  03/10/2013  Clinical Social Worker:  Read Drivers  Date/time:  03/15/2013 12:39 PM  Clinical Social Work is seeking post-discharge placement for this patient at the following level of care:   SKILLED NURSING   (*CSW will update this form in Epic as items are completed)   03/12/2013  Patient/family provided with [facility] Department of Clinical Social Work's list of facilities offering this level of care within the geographic area requested by the patient (or if unable, by the patient's family).  03/12/2013  Patient/family informed of their freedom to choose among providers that offer the needed level of care, that participate in Medicare, Medicaid or managed care program needed by the patient, have an available bed and are willing to accept the patient.  03/12/2013  Patient/family informed of [facility]' ownership interest in [facility], as well as of the fact that they are under no obligation to receive care at this facility.  PASARR submitted to EDS on  PASARR number received from EDS on   FL2 transmitted to all facilities in geographic area requested by pt/family on  03/12/2013 FL2 transmitted to all facilities within larger geographic area on   Patient informed that his/her managed care company has contracts with or will negotiate with  certain facilities, including the following:     Patient/family informed of bed offers received:  03/12/2013 Patient chooses bed at  Physician recommends and patient chooses bed at    Patient to be transferred to  on   Patient to be transferred to facility by   The following physician request were entered in Epic:   Additional Comments:

## 2013-03-15 NOTE — Clinical Social Work Psychosocial (Signed)
Clinical Social Work Department BRIEF PSYCHOSOCIAL ASSESSMENT 03/15/2013  Patient:  Gina Ashley, Gina Ashley     Account Number:  [id number]     Admit date:  03/10/2013  Clinical Social Worker:  Read Drivers  Date/Time:  03/15/2013 12:16 PM  Referred by:  RN  Date Referred:  03/15/2013 Referred for  SNF Placement   Other Referral:   from: [facility] SNF does not wish to return   Interview type:  Other - See comment Other interview type:   pt and daughter Gina Ashley    PSYCHOSOCIAL DATA Living Status:  FAMILY Admitted from facility:   Level of care:   Primary support name:  Gina Ashley Primary support relationship to patient:  CHILD, ADULT Degree of support available:   good    CURRENT CONCERNS Current Concerns  Post-Acute Placement   Other Concerns:   none    SOCIAL WORK ASSESSMENT / PLAN CSW assessed pt at bedside along with pt daughter, Gina Ashley. Pt was alert and oriented x4.  Pt stated that she has broken both of her legs and is having difficulty ambulating.    Pt and daughter reside together in an apartment.  Pt was at [facility] for approximately 1 month prior to admission.  Pt is unhappy with the level of care received at [facility]. Pt has been faxed out and bed offers have been received/presented.  CSW will continue to assit with disposition planning and SNF needs.   Assessment/plan status:  Referral to [facility] Other assessment/ plan:   none   Information/referral to community resources:   SNF    PATIENT'S/FAMILY'S RESPONSE TO PLAN OF CARE: Pt and daughter agreeable to SNF search.  Pt and daughter appreciative of CSW assistance.

## 2013-03-15 NOTE — Clinical Social Work Note (Addendum)
CSW reviewed chart: CIR consult placed.  CSW notified pt and daughter, Bernadene Bell of additional offers: [facility], commercial, [address] [facility] [place], [address], [place] and [facility] [facility].  Pt daughter states she is interested in CIR first and SNF as back up.  Pt daughter stated she advised RN that she would like a CIR consult.  CSW requests daughter and pt to have back-up SNF decision for CSW today.  Pt and daughter acknowledged understanding.    Vickii Penna, LCSWA [phone]  Clinical Social Work

## 2013-03-15 NOTE — Progress Notes (Signed)
[facility] PROGRESS NOTE  Gina Ashley:[id number] DOB: 1927/09/01 DOA: 03/10/2013 PCP: Kimber Relic, MD  Assessment/Plan: #1 acute on chronic right-sided heart failure May be secondary to recent malfunction or BiPAP and dietary indiscretion at the skilled nursing facility as patient states that everybody eats the same diet. Clinical improvement. Patient's weight last discharge was 215 pounds. Patient was 224 pounds 3 days ago, was 217  2 days ago,211 pounds yesterday and 211 today. Creatinine slightly increased today. Will d/c lasix. When weight starts to increase and creatinine starts to trend back down will resume home dose demadex. Continue Coreg and bidil.  Cardiology following.  #2 acute on chronic kidney disease Patient's baseline creatinine is approximately 2.2. Creatinine on admission was 3.06. Likely secondary to problem #1/cardiorenal syndrome. Renal function improved with diuresis. Creatinine today is 2.46. Hold lasix today and when weight starts to increase and creatinine trending down will resume home dose demadex. Will follow. Patient has been assessed by the renal team during her last hospitalization and is deemed not to be a candidate for hemodialysis. Follow.  #3 hypertension Patient blood pressure improved. Continue Coreg and bidil.  #4 hyponatremia Likely secondary to hypervolemic hyponatremia secondary to acute on chronic CHF exacerbation. Resolved with diuresis.  #5 atrial fibrillation Continue Coreg and amiodarone for rate control. Coumadin for anticoagulation.  #6 Well controlled type 2 diabetes HgbA1C = 6.8 01/02/13. CBGs every from 146-177. Continue Lantus. Sliding scale insulin.  #7 obesity  #8 hypokalemia Secondary to diureses. Replete.  #9 obstructive sleep apnea CPAP each bedtime  #10 prophylaxis On Coumadin for DVT prophylaxis.   Code Status: DO NOT RESUSCITATE Family Communication: Updated patient and daughter at bedside. Disposition  Plan: To SNF when medically stable   Consultants:  Cardiology/heart failure team: Dr. Gala Romney 03/12/2013  Procedures:  Lower extremity Dopplers 03/12/2013  Chest x-ray 03/10/2013  Antibiotics:  None  HPI/Subjective: Patient sleeping, but easily arousable. No complaints.  Objective: Filed Vitals:   03/14/13 0938 03/14/13 1709 03/14/13 2113 03/15/13 0658  BP:  134/78 133/83 115/64  Pulse:  91 78 79  Temp:  97.8 F (36.6 C) 98.2 F (36.8 C) 98.2 F (36.8 C)  TempSrc:  Oral Oral Oral  Resp:   20 20  Height:      Weight:      SpO2: 96%  98% 97%    Intake/Output Summary (Last 24 hours) at 03/15/13 0841 Last data filed at 03/14/13 1653  Gross per 24 hour  Intake    243 ml  Output      0 ml  Net    243 ml   Filed Weights   03/12/13 0534 03/13/13 0555 03/14/13 0555  Weight: 101.8 kg (224 lb 6.9 oz) 98.431 kg (217 lb) 95.8 kg (211 lb 3.2 oz)    Exam:   General:  NAD  Cardiovascular: Irregularly irregular.  Respiratory: clear, no wheezing, no rhonchi  Abdomen: Soft/NT/ND/+BS  EXTREMITIES: No c/c. Trace edema  Data Reviewed: Basic Metabolic Panel:  Recent Labs Lab 03/10/13 2117 03/12/13 0530 03/13/13 0742 03/14/13 0435 03/15/13 0526  NA 127* 136 135 137 137  K 4.1 3.6 3.4* 3.2* 4.3  CL 82* 89* 89* 89* 92*  CO2 36* 38* 37* 41* 41*  GLUCOSE 204* 150* 126* 116* 163*  BUN 76* 71* 60* 57* 59*  CREATININE 3.06* 2.66* 1.86* 1.98* 2.46*  CALCIUM 9.4 9.3 9.6 9.8 9.8   Liver Function Tests:  Recent Labs Lab 03/10/13 2117  AST 19  ALT 11  ALKPHOS 121*  BILITOT 0.5  PROT 7.7  ALBUMIN 4.1   No results found for this basename: LIPASE, AMYLASE,  in the last 168 hours No results found for this basename: AMMONIA,  in the last 168 hours CBC:  Recent Labs Lab 03/10/13 2117 03/13/13 0742  WBC 5.9 5.3  HGB 9.9* 10.2*  HCT 29.4* 31.7*  MCV 83.1 86.4  PLT 89* 100*   Cardiac Enzymes:  Recent Labs Lab 03/11/13 0215 03/11/13 0815  TROPONINI  <0.30 <0.30   BNP (last 3 results)  Recent Labs  02/08/13 2140 03/10/13 2117 03/13/13 0747  PROBNP 3800.0* 4061.0* 4669.0*   CBG:  Recent Labs Lab 03/13/13 2104 03/14/13 0619 03/14/13 1024 03/14/13 1601 03/14/13 2153  GLUCAP 179* 116* 166* 177* 146*    Recent Results (from the past 240 hour(s))  MRSA PCR SCREENING     Status: None   Collection Time    03/11/13  2:22 AM      Result Value Range Status   MRSA by PCR NEGATIVE  NEGATIVE Final   Comment:            The GeneXpert MRSA Assay (FDA     approved for NASAL specimens     only), is one component of a     comprehensive MRSA colonization     surveillance program. It is not     intended to diagnose MRSA     infection nor to guide or     monitor treatment for     MRSA infections.     Studies: No results found.  Scheduled Meds: . allopurinol  100 mg Oral Daily  . amiodarone  400 mg Oral Daily  . busPIRone  5 mg Oral BID  . carvedilol  12.5 mg Oral BID WC  . docusate sodium  100 mg Oral Q8H  . insulin aspart  0-15 Units Subcutaneous TID WC  . insulin aspart  0-5 Units Subcutaneous QHS  . insulin glargine  5 Units Subcutaneous QHS  . isosorbide-hydrALAZINE  1 tablet Oral BID  . multivitamin with minerals  1 tablet Oral Daily  . OxyCODONE  10 mg Oral BID  . pantoprazole  40 mg Oral Daily  . polyethylene glycol  17 g Oral BID  . saccharomyces boulardii  250 mg Oral BID  . sodium chloride  3 mL Intravenous Q12H  . traZODone  50 mg Oral QHS  . Warfarin - Pharmacist Dosing Inpatient   Does not apply q1800   Continuous Infusions:    Principal Problem:   Acute on chronic diastolic heart failure Active Problems:   DM   OBSTRUCTIVE SLEEP APNEA   HYPERTENSION   GERD   Obesity (BMI 30-39.9)   Depression   Cardiorenal syndrome with renal failure   Renal failure (ARF), acute on chronic   Hyponatremia   Chronic a-fib    Time spent: > 35 mins    [facility]  [facility] Pager [phone].  If 7PM-7AM, please contact night-coverage at [email or web address], password [facility] 03/15/2013, 8:41 AM  LOS: 5 days

## 2013-03-15 NOTE — Progress Notes (Signed)
Advanced Heart Failure Rounding Note   Subjective:    Gina Ashley is an 79 y.o. female with history of chronic atrial fibrillation on coumadin, diastolic heart failure and HTN. She also has DM type 2, COPD, OSA on CPAP, A Fib- chronic coumadin, and CKD failure with baseline Cr 2.2. She has had 5 hospitalizations in the last 6 months, 1 included tibia/fibula fracture, 1 for UTI, and the rest have been due to massive fluid overload in the setting of diastolic heart failure. Per nephrology she is not a candidate for HD.   She presented to [facility] ED 02/09/13 with increasing abdominal girth. Increased weight noted at SNF 229 pounds. (baseline weight 215 pounds).  Started lasix 15 mg/hg. Lasix stopped yesterday.   Weight below baseline, down 14 pounds total.  Cr 1.86>>2.46  Resting comfortably.      Objective:   Weight Range:  Vital Signs:   Temp:  [97.8 F (36.6 C)-98.2 F (36.8 C)] 98.2 F (36.8 C) (05/12 0658) Pulse Rate:  [78-98] 79 (05/12 0658) Resp:  [18-20] 20 (05/12 0658) BP: (115-134)/(64-83) 115/64 mmHg (05/12 0658) SpO2:  [96 %-100 %] 97 % (05/12 0658) Last BM Date: 03/14/13  Weight change: Filed Weights   03/12/13 0534 03/13/13 0555 03/14/13 0555  Weight: 101.8 kg (224 lb 6.9 oz) 98.431 kg (217 lb) 95.8 kg (211 lb 3.2 oz)    Intake/Output:   Intake/Output Summary (Last 24 hours) at 03/15/13 0834 Last data filed at 03/14/13 1653  Gross per 24 hour  Intake    243 ml  Output      0 ml  Net    243 ml     Physical Exam: General:  Lying in bed. No resp difficulty HEENT: normal Neck: supple. JVP unable to see. Seems flat .  Cor: PMI nondisplaced. Regular rate & rhythm. No rubs, gallops or murmurs. Lungs: clear Abdomen: soft, nontender, nondistended. No hepatosplenomegaly. No bruits or masses. Good bowel sounds. Extremities: no cyanosis, clubbing, rash, edema Neuro: alert & orientedx3, cranial nerves grossly intact. moves all 4 extremities w/o difficulty. Affect  pleasant   Labs: Basic Metabolic Panel:  Recent Labs Lab 03/10/13 2117 03/12/13 0530 03/13/13 0742 03/14/13 0435 03/15/13 0526  NA 127* 136 135 137 137  K 4.1 3.6 3.4* 3.2* 4.3  CL 82* 89* 89* 89* 92*  CO2 36* 38* 37* 41* 41*  GLUCOSE 204* 150* 126* 116* 163*  BUN 76* 71* 60* 57* 59*  CREATININE 3.06* 2.66* 1.86* 1.98* 2.46*  CALCIUM 9.4 9.3 9.6 9.8 9.8    Liver Function Tests:  Recent Labs Lab 03/10/13 2117  AST 19  ALT 11  ALKPHOS 121*  BILITOT 0.5  PROT 7.7  ALBUMIN 4.1   No results found for this basename: LIPASE, AMYLASE,  in the last 168 hours No results found for this basename: AMMONIA,  in the last 168 hours  CBC:  Recent Labs Lab 03/10/13 2117 03/13/13 0742  WBC 5.9 5.3  HGB 9.9* 10.2*  HCT 29.4* 31.7*  MCV 83.1 86.4  PLT 89* 100*    Cardiac Enzymes:  Recent Labs Lab 03/11/13 0215 03/11/13 0815  TROPONINI <0.30 <0.30    BNP: BNP (last 3 results)  Recent Labs  02/08/13 2140 03/10/13 2117 03/13/13 0747  PROBNP 3800.0* 4061.0* 4669.0*     Other results:    Imaging: No results found.   Medications:     Scheduled Medications: . allopurinol  100 mg Oral Daily  . amiodarone  400 mg Oral Daily  .  busPIRone  5 mg Oral BID  . carvedilol  12.5 mg Oral BID WC  . docusate sodium  100 mg Oral Q8H  . furosemide  60 mg Oral Daily  . insulin aspart  0-15 Units Subcutaneous TID WC  . insulin aspart  0-5 Units Subcutaneous QHS  . insulin glargine  5 Units Subcutaneous QHS  . isosorbide-hydrALAZINE  1 tablet Oral BID  . multivitamin with minerals  1 tablet Oral Daily  . OxyCODONE  10 mg Oral BID  . pantoprazole  40 mg Oral Daily  . polyethylene glycol  17 g Oral BID  . saccharomyces boulardii  250 mg Oral BID  . sodium chloride  3 mL Intravenous Q12H  . traZODone  50 mg Oral QHS  . Warfarin - Pharmacist Dosing Inpatient   Does not apply q1800    Infusions:    PRN Medications: sodium chloride, acetaminophen, ALPRAZolam,  diphenhydrAMINE, ondansetron (ZOFRAN) IV, oxyCODONE, simethicone, sodium chloride   Assessment:   1. Acute on chronic right sided heart failure  2. Acute on chronic renal failure.  3. HTN  4. Obesity, suspect OHS/OSA  5. Chronic atrial fib  - on coumadin  6. Deconditioning  7. DNR   Plan/Discussion:    Weight below baseline. Cr up a bit. Continue to hold IV lasix. When weight starts trending up and CR down would restart home demadex at 60 daily and can discharge to SNF when available with HF f/u.   D/w Dr. Janee Morn.  Length of Stay: 5   Arvilla Meres 03/15/2013, 8:34 AM  Advanced Heart Failure Team Pager [phone] (M-F; 7a - 4p)  Please contact [facility] for night-coverage after hours (4p -7a ) and weekends on [email or web address]  Advanced Heart Failure Team Pager [phone] (M-F; 7a - 4p)  Please contact [facility] for night-coverage after hours (4p -7a ) and weekends on [email or web address]

## 2013-03-15 NOTE — Progress Notes (Signed)
ANTICOAGULATION CONSULT NOTE  Pharmacy Consult for Coumadin Indication: atrial fibrillation  Allergies  Allergen Reactions  . Morphine Sulfate Other (See Comments)    REACTION: change in personality  . Remeron (Mirtazapine) Other (See Comments)    Altered mental status, lethargy  . Tuna (Fish Allergy) Other (See Comments)    unknown  . Penicillins Rash    Tolerates Ancef.    Patient Measurements: Height: 5\' 3"  (160 cm) Weight: 211 lb 3.2 oz (95.8 kg) (bed ) IBW/kg (Calculated) : 52.4  Vital Signs: Temp: 98.2 F (36.8 C) (05/12 0658) Temp src: Oral (05/12 0658) BP: 140/87 mmHg (05/12 0943) Pulse Rate: 77 (05/12 0943)  Labs:  Recent Labs  03/13/13 0742 03/14/13 0435 03/15/13 0526  HGB 10.2*  --   --   HCT 31.7*  --   --   PLT 100*  --   --   LABPROT 21.9* 21.6* 22.3*  INR 2.00* 1.96* 2.05*  CREATININE 1.86* 1.98* 2.46*    Estimated Creatinine Clearance: 19.1 ml/min (by C-G formula based on Cr of 2.46).  Home Medications: Warfarin 4mg  daily  Assessment: 79 yo female admitted with CHF continues on coumadin for afib. INR today is therapeutic at 2.0. Hg 10.2 and plts were low at 100. No bleeding noted.  Goal of Therapy:  INR 2-3   Plan:  1. Coumadin 6mg  PO x 1 tonight 2. F/u AM INR  Sheppard Coil PharmD., BCPS Clinical Pharmacist Pager [phone] 03/15/2013 11:31 AM

## 2013-03-16 ENCOUNTER — Inpatient Hospital Stay (HOSPITAL_COMMUNITY): Payer: Medicare Other

## 2013-03-16 DIAGNOSIS — K5900 Constipation, unspecified: Secondary | ICD-10-CM | POA: Diagnosis present

## 2013-03-16 LAB — BASIC METABOLIC PANEL
BUN: 62 mg/dL — ABNORMAL HIGH (ref 6–23)
Chloride: 90 mEq/L — ABNORMAL LOW (ref 96–112)
GFR calc Af Amer: 18 mL/min — ABNORMAL LOW (ref >90)
GFR calc non Af Amer: 16 mL/min — ABNORMAL LOW (ref >90)
Potassium: 4.7 mEq/L (ref 3.5–5.1)

## 2013-03-16 LAB — PROTIME-INR
INR: 2.16 — ABNORMAL HIGH (ref 0.00–1.49)
Prothrombin Time: 23.2 seconds — ABNORMAL HIGH (ref 11.6–15.2)

## 2013-03-16 LAB — GLUCOSE, CAPILLARY
Glucose-Capillary: 141 mg/dL — ABNORMAL HIGH (ref 70–99)
Glucose-Capillary: 208 mg/dL — ABNORMAL HIGH (ref 70–99)

## 2013-03-16 MED ORDER — SORBITOL 70 % SOLN
30.0000 mL | Freq: Once | Status: AC
  Administered 2013-03-16: 30 mL via ORAL
  Filled 2013-03-16: qty 30

## 2013-03-16 MED ORDER — WARFARIN SODIUM 4 MG PO TABS
4.0000 mg | ORAL_TABLET | Freq: Once | ORAL | Status: AC
  Administered 2013-03-16: 4 mg via ORAL
  Filled 2013-03-16: qty 1

## 2013-03-16 MED ORDER — FUROSEMIDE 10 MG/ML IJ SOLN
80.0000 mg | Freq: Once | INTRAMUSCULAR | Status: AC
  Administered 2013-03-16: 80 mg via INTRAVENOUS
  Filled 2013-03-16: qty 8

## 2013-03-16 NOTE — Progress Notes (Signed)
Pt stated being SOB and feeling tight in abdomen. Pt has stated 2-3x today Spoke with NP on each occurrence. NP gave 80IV lasix.    Pt still saying they are SOB and feeling tight in the abdomen. Page PA to make aware. MD thompson seeing Pt at this time.  Will continue to monitor Pt.

## 2013-03-16 NOTE — Progress Notes (Signed)
Advanced Heart Failure Rounding Note   Subjective:    Gina Ashley is an 79 y.o. female with history of chronic atrial fibrillation on coumadin, diastolic heart failure and HTN. She also has DM type 2, COPD, OSA on CPAP, A Fib- chronic coumadin, and CKD failure with baseline Cr 2.2. She has had 5 hospitalizations in the last 6 months, 1 included tibia/fibula fracture, 1 for UTI, and the rest have been due to massive fluid overload in the setting of diastolic heart failure. Per nephrology she is not a candidate for HD.   She presented to [facility] ED 02/09/13 with increasing abdominal girth. Increased weight noted at SNF 229 pounds. (baseline weight 215 pounds).  Started lasix 15 mg/hr. Lasix stopped 5/11.   Weight back up to 217 pounds after holding lasix.  Cr 1.86>>2.46>2.65.  Increased dyspnea and decreased urine output over the last 24 hours.  No chest pain, fever/chills.     Objective:   Weight Range:  Vital Signs:   Temp:  [97.8 F (36.6 C)-97.9 F (36.6 C)] 97.9 F (36.6 C) (05/13 0333) Pulse Rate:  [72-85] 72 (05/13 1411) Resp:  [18-20] 18 (05/13 1411) BP: (109-141)/(68-85) 109/68 mmHg (05/13 1411) SpO2:  [97 %-100 %] 99 % (05/13 1411) Weight:  [98.8 kg (217 lb 13 oz)] 98.8 kg (217 lb 13 oz) (05/13 0333) Last BM Date: 03/15/13  Weight change: Filed Weights   03/13/13 0555 03/14/13 0555 03/16/13 0333  Weight: 98.431 kg (217 lb) 95.8 kg (211 lb 3.2 oz) 98.8 kg (217 lb 13 oz)    Intake/Output:   Intake/Output Summary (Last 24 hours) at 03/16/13 1544 Last data filed at 03/16/13 1300  Gross per 24 hour  Intake    703 ml  Output    400 ml  Net    303 ml     Physical Exam: General:  Sitting up in chair.  No visible resp difficulty on  HEENT: normal Neck: supple. JVP elevated but unable to see completely due to body habitus.  Cor: PMI nondisplaced. Regular rate & rhythm. No rubs, gallops or murmurs. Lungs: clear Abdomen: obese soft, nontender, no distention. No  hepatosplenomegaly. No bruits or masses. Good bowel sounds. Extremities: no cyanosis, clubbing, rash, tr- 1+ edema Neuro: alert & orientedx3, cranial nerves grossly intact. moves all 4 extremities w/o difficulty. Affect pleasant   Labs: Basic Metabolic Panel:  Recent Labs Lab 03/12/13 0530 03/13/13 0742 03/14/13 0435 03/15/13 0526 03/16/13 0432  NA 136 135 137 137 134*  K 3.6 3.4* 3.2* 4.3 4.7  CL 89* 89* 89* 92* 90*  CO2 38* 37* 41* 41* 38*  GLUCOSE 150* 126* 116* 163* 140*  BUN 71* 60* 57* 59* 62*  CREATININE 2.66* 1.86* 1.98* 2.46* 2.65*  CALCIUM 9.3 9.6 9.8 9.8 9.7    Liver Function Tests:  Recent Labs Lab 03/10/13 2117  AST 19  ALT 11  ALKPHOS 121*  BILITOT 0.5  PROT 7.7  ALBUMIN 4.1   No results found for this basename: LIPASE, AMYLASE,  in the last 168 hours No results found for this basename: AMMONIA,  in the last 168 hours  CBC:  Recent Labs Lab 03/10/13 2117 03/13/13 0742  WBC 5.9 5.3  HGB 9.9* 10.2*  HCT 29.4* 31.7*  MCV 83.1 86.4  PLT 89* 100*    Cardiac Enzymes:  Recent Labs Lab 03/11/13 0215 03/11/13 0815  TROPONINI <0.30 <0.30    BNP: BNP (last 3 results)  Recent Labs  02/08/13 2140 03/10/13 2117 03/13/13 0747  PROBNP 3800.0* 4061.0* 4669.0*     Other results:    Imaging: No results found.   Medications:     Scheduled Medications: . allopurinol  100 mg Oral Daily  . amiodarone  400 mg Oral Daily  . busPIRone  5 mg Oral BID  . carvedilol  12.5 mg Oral BID WC  . docusate sodium  100 mg Oral Q8H  . insulin aspart  0-15 Units Subcutaneous TID WC  . insulin aspart  0-5 Units Subcutaneous QHS  . insulin glargine  5 Units Subcutaneous QHS  . isosorbide-hydrALAZINE  1 tablet Oral BID  . multivitamin with minerals  1 tablet Oral Daily  . OxyCODONE  10 mg Oral BID  . pantoprazole  40 mg Oral Daily  . polyethylene glycol  17 g Oral BID  . saccharomyces boulardii  250 mg Oral BID  . sodium chloride  3 mL  Intravenous Q12H  . traZODone  50 mg Oral QHS  . warfarin  4 mg Oral ONCE-1800  . Warfarin - Pharmacist Dosing Inpatient   Does not apply q1800    Infusions:    PRN Medications: sodium chloride, acetaminophen, ALPRAZolam, bisacodyl, diphenhydrAMINE, ondansetron (ZOFRAN) IV, oxyCODONE, simethicone, sodium chloride   Assessment:   1. Acute on chronic right sided heart failure  2. Acute on chronic renal failure.  3. HTN  4. Obesity, suspect OHS/OSA  5. Chronic atrial fib  - on coumadin  6. Deconditioning  7. DNR   Plan/Discussion:    Weight up 6 pounds from holding IV lasix.  Her creatinine is up slightly but still within her baseline range. Will give one dose IV lasix today.  Likely can go back to SNF in am.    Truman Hayward 3:45 PM   Length of Stay: 6   Advanced Heart Failure Team Pager [phone] (M-F; 7a - 4p)  Please contact [facility] for night-coverage after hours (4p -7a ) and weekends on [email or web address]

## 2013-03-16 NOTE — Progress Notes (Signed)
Physical Therapy Treatment Patient Details Name: Gina Ashley MRN: [id number] DOB: 09/04/1927 Today's Date: 03/16/2013 Time: 7829-5621 PT Time Calculation (min): 25 min  PT Assessment / Plan / Recommendation Comments on Treatment Session  Pt with improved standing tolerance but required increased assist this date to achieve standing. Pt con't to benefit from return to SNF up d/c from hospital to progress OOB mobiltiy.    Follow Up Recommendations  SNF     Does the patient have the potential to tolerate intense rehabilitation     Barriers to Discharge        Equipment Recommendations       Recommendations for Other Services    Frequency Min 3X/week   Plan Discharge plan remains appropriate;Frequency remains appropriate    Precautions / Restrictions Precautions Precautions: Fall Restrictions Weight Bearing Restrictions: No   Pertinent Vitals/Pain Pt denies pain    Mobility  Bed Mobility Bed Mobility: Left Sidelying to Sit;Sit to Sidelying Left;Scooting to [facility];Sitting - Scoot to Edge of Bed Left Sidelying to Sit: 4: Min assist;With rails;HOB elevated Sitting - Scoot to [place] of Bed: 4: Min assist Sit to Sidelying Left: 3: Mod assist;With rail;HOB flat (A for LEs, v/c's for sequence) Scooting to [facility]: With rail;5: Supervision Details for Bed Mobility Assistance: pt able to move self to [facility] with use of bed rail Transfers Transfers: Sit to Stand;Stand to Sit Sit to Stand: 1: +2 Total assist;With upper extremity assist;From bed Sit to Stand: Patient Percentage: 50% Stand to Sit: 3: Mod assist;With upper extremity assist;To bed Details for Transfer Assistance: max verbal cues to sequencing, pt completed 4 sit to stand, 2 at 1 min each. pt with bilat knee instability requiring blocking of R knee. Pt with increased stabilty with RW. attempted to march in place however pt unable to maintain standing requiring maxA to return to sitting EOB Ambulation/Gait Ambulation/Gait Assistance:  Not tested (comment)    Exercises     PT Diagnosis:    PT Problem List:   PT Treatment Interventions:     PT Goals Acute Rehab PT Goals PT Goal: Supine/Side to Sit - Progress: Progressing toward goal PT Transfer Goal: Bed to Chair/Chair to Bed - Progress: Progressing toward goal PT Goal: Stand - Progress: Progressing toward goal  Visit Information  Last PT Received On: 03/16/13 Assistance Needed: +2    Subjective Data  Subjective: Pt received supine in bed agreeable to PT.   Cognition  Cognition Arousal/Alertness: Awake/alert Behavior During Therapy: WFL for tasks assessed/performed Overall Cognitive Status: Within Functional Limits for tasks assessed    Balance  Static Sitting Balance Static Sitting - Balance Support: No upper extremity supported Static Sitting - Level of Assistance: 5: Stand by assistance Static Sitting - Comment/# of Minutes: 5 min Static Standing Balance Static Standing - Balance Support: Bilateral upper extremity supported Static Standing - Level of Assistance: 3: Mod assist (R knee blocked) Static Standing - Comment/# of Minutes: 2 x 60 sec. 2x <30 sec, tactile cues to maintain bilat hip ext  End of Session PT - End of Session Equipment Utilized During Treatment: Gait belt;Oxygen Activity Tolerance: Patient tolerated treatment well Patient left: in bed;with call bell/phone within reach Nurse Communication: Mobility status   GP     Marcene Brawn 03/16/2013, 4:20 PM   Lewis Shock, PT, DPT Pager #: [phone] Office #: [phone]

## 2013-03-16 NOTE — Progress Notes (Signed)
CSW met with patient and her daughter Bernadene Bell today to discuss d/c disposition. Extended bed search has been completed and bed offers given to patient and daughter.  They do not wish to accept any of the bed offers given and choose to return to [place]. CSW contacted Bjorn Loser at [facility]- they will have a bed available for patient tomorrow. Will await medical stability per MD.  FL2 on shadow chart for MD's signature.  Lorri Frederick. [name]  [phone]

## 2013-03-16 NOTE — Progress Notes (Signed)
[facility] PROGRESS NOTE  Gina Ashley:[id number] DOB: 11-28-1926 DOA: 03/10/2013 PCP: Kimber Relic, MD  Assessment/Plan: #1 acute on chronic right-sided heart failure May be secondary to recent malfunction or BiPAP and dietary indiscretion at the skilled nursing facility as patient states that everybody eats the same diet. Clinical improvement. Patient's weight last discharge was 215 pounds. Patient was 224 pounds 4 days ago, was 217  3 days ago,211 pounds 2 days ago and 211 yesterday and now 217. Creatinine slightly increased today. Patient with slight increase in her weight from yesterday however close to baseline. Lasix was held yesterday. Patient given a dose of 80 mg IV Lasix x1 today per the heart failure team. Will give another dose of 80 mg IV x1. As 400 cc was only recorded out. Follow renal function closely. May need to resume Demadex in the morning. Continue Coreg and bidil. Cardiology following.  #2 acute on chronic kidney disease Patient's baseline creatinine is approximately 2.2. Creatinine on admission was 3.06. Likely secondary to problem #1/cardiorenal syndrome. Renal function improved with diuresis. Creatinine today is 2.65. Lasix was held yesterday. Patient's weight today is 217 pounds. Patient given a dose of 80 mg of IV Lasix x1 today per heart failure team. Will give another dose of 80 mg IV x1. Follow renal function closely. Patient has been assessed by the renal team during her last hospitalization and is deemed not to be a candidate for hemodialysis. Follow.  #3 hypertension Patient blood pressure improved. Continue Coreg and bidil.  #4 hyponatremia Likely secondary to hypervolemic hyponatremia secondary to acute on chronic CHF exacerbation. Resolved with diuresis.  #5 atrial fibrillation Continue Coreg and amiodarone for rate control. Coumadin for anticoagulation.  #6 Well controlled type 2 diabetes HgbA1C = 6.8 01/02/13. CBGs every from 105-153. Continue  Lantus. Sliding scale insulin.  #7 obesity  #8 hypokalemia Secondary to diureses. Repleted.  #9 obstructive sleep apnea CPAP each bedtime   #10 constipation Will give sorbitol orally. Check an acute abdominal series.  #11 prophylaxis On Coumadin for DVT prophylaxis.   Code Status: DO NOT RESUSCITATE Family Communication: Updated patient and daughter at bedside. Disposition Plan: To SNF when medically stable in 1-2 days   Consultants:  Cardiology/heart failure team: Dr. Gala Romney 03/12/2013  Procedures:  Lower extremity Dopplers 03/12/2013  Chest x-ray 03/10/2013  Acute abdominal series pending  Antibiotics:  None  HPI/Subjective: Patient complaining of shortness of breath and abdominal distention. Patient also complaining of back pain. Patient also states has not had a good bowel movement.  Objective: Filed Vitals:   03/15/13 2345 03/16/13 0333 03/16/13 1110 03/16/13 1411  BP:  117/83 141/85 109/68  Pulse: 79 80 79 72  Temp:  97.9 F (36.6 C)    TempSrc:  Oral    Resp: 18 20 18 18   Height:      Weight:  98.8 kg (217 lb 13 oz)    SpO2: 97% 99% 100% 99%    Intake/Output Summary (Last 24 hours) at 03/16/13 1834 Last data filed at 03/16/13 1300  Gross per 24 hour  Intake    483 ml  Output    400 ml  Net     83 ml   Filed Weights   03/13/13 0555 03/14/13 0555 03/16/13 0333  Weight: 98.431 kg (217 lb) 95.8 kg (211 lb 3.2 oz) 98.8 kg (217 lb 13 oz)    Exam:   General:  NAD  Cardiovascular: Irregularly irregular.  Respiratory: clear, no wheezing, no rhonchi  Abdomen:  Soft/NTmild distension/+BS  EXTREMITIES: No c/c. Trace edema  Data Reviewed: Basic Metabolic Panel:  Recent Labs Lab 03/12/13 0530 03/13/13 0742 03/14/13 0435 03/15/13 0526 03/16/13 0432  NA 136 135 137 137 134*  K 3.6 3.4* 3.2* 4.3 4.7  CL 89* 89* 89* 92* 90*  CO2 38* 37* 41* 41* 38*  GLUCOSE 150* 126* 116* 163* 140*  BUN 71* 60* 57* 59* 62*  CREATININE 2.66* 1.86*  1.98* 2.46* 2.65*  CALCIUM 9.3 9.6 9.8 9.8 9.7   Liver Function Tests:  Recent Labs Lab 03/10/13 2117  AST 19  ALT 11  ALKPHOS 121*  BILITOT 0.5  PROT 7.7  ALBUMIN 4.1   No results found for this basename: LIPASE, AMYLASE,  in the last 168 hours No results found for this basename: AMMONIA,  in the last 168 hours CBC:  Recent Labs Lab 03/10/13 2117 03/13/13 0742  WBC 5.9 5.3  HGB 9.9* 10.2*  HCT 29.4* 31.7*  MCV 83.1 86.4  PLT 89* 100*   Cardiac Enzymes:  Recent Labs Lab 03/11/13 0215 03/11/13 0815  TROPONINI <0.30 <0.30   BNP (last 3 results)  Recent Labs  02/08/13 2140 03/10/13 2117 03/13/13 0747  PROBNP 3800.0* 4061.0* 4669.0*   CBG:  Recent Labs Lab 03/15/13 1147 03/15/13 1638 03/15/13 2119 03/16/13 0629 03/16/13 1647  GLUCAP 212* 153* 105* 141* 208*    Recent Results (from the past 240 hour(s))  MRSA PCR SCREENING     Status: None   Collection Time    03/11/13  2:22 AM      Result Value Range Status   MRSA by PCR NEGATIVE  NEGATIVE Final   Comment:            The GeneXpert MRSA Assay (FDA     approved for NASAL specimens     only), is one component of a     comprehensive MRSA colonization     surveillance program. It is not     intended to diagnose MRSA     infection nor to guide or     monitor treatment for     MRSA infections.     Studies: No results found.  Scheduled Meds: . allopurinol  100 mg Oral Daily  . amiodarone  400 mg Oral Daily  . busPIRone  5 mg Oral BID  . carvedilol  12.5 mg Oral BID WC  . docusate sodium  100 mg Oral Q8H  . furosemide  80 mg Intravenous Once  . insulin aspart  0-15 Units Subcutaneous TID WC  . insulin aspart  0-5 Units Subcutaneous QHS  . insulin glargine  5 Units Subcutaneous QHS  . isosorbide-hydrALAZINE  1 tablet Oral BID  . multivitamin with minerals  1 tablet Oral Daily  . OxyCODONE  10 mg Oral BID  . pantoprazole  40 mg Oral Daily  . polyethylene glycol  17 g Oral BID  .  saccharomyces boulardii  250 mg Oral BID  . sodium chloride  3 mL Intravenous Q12H  . sorbitol  30 mL Oral Once  . traZODone  50 mg Oral QHS  . Warfarin - Pharmacist Dosing Inpatient   Does not apply q1800   Continuous Infusions:    Principal Problem:   Acute on chronic diastolic heart failure Active Problems:   DM   OBSTRUCTIVE SLEEP APNEA   HYPERTENSION   GERD   Obesity (BMI 30-39.9)   Depression   Cardiorenal syndrome with renal failure   Renal failure (ARF), acute on chronic  Hyponatremia   Chronic a-fib    Time spent: > 35 mins    [facility]  [facility] Pager [phone]. If 7PM-7AM, please contact night-coverage at [email or web address], password [email or web address] 03/16/2013, 6:34 PM  LOS: 6 days

## 2013-03-16 NOTE — Progress Notes (Signed)
ANTICOAGULATION CONSULT NOTE  Pharmacy Consult for Coumadin Indication: atrial fibrillation  Patient Measurements: Height: 5\' 3"  (160 cm) Weight: 217 lb 13 oz (98.8 kg) IBW/kg (Calculated) : 52.4  Vital Signs: Temp: 97.9 F (36.6 C) (05/13 0333) Temp src: Oral (05/13 0333) BP: 117/83 mmHg (05/13 0333) Pulse Rate: 80 (05/13 0333)  Labs:  Recent Labs  03/14/13 0435 03/15/13 0526 03/16/13 0432  LABPROT 21.6* 22.3* 23.2*  INR 1.96* 2.05* 2.16*  CREATININE 1.98* 2.46* 2.65*    Estimated Creatinine Clearance: 18 ml/min (by C-G formula based on Cr of 2.65).  Home Medications: Warfarin 4mg  daily  Assessment: 79 yo female admitted with CHF continues on coumadin for afib. INR today is therapeutic at 2.16. Hg 10.2 and plts were low at 100. No bleeding noted. Will transition back to home dose.  Goal of Therapy:  INR 2-3   Plan:  1. Coumadin 4mg  PO x 1 tonight 2. F/u AM INR  Sheppard Coil PharmD., BCPS Clinical Pharmacist Pager [phone] 03/16/2013 10:00 AM

## 2013-03-16 NOTE — Progress Notes (Signed)
PT Cancellation Note  Patient Details Name: Gina Ashley MRN: [id number] DOB: [birth year]   Cancelled Treatment:    Reason Eval/Treat Not Completed: Medical issues which prohibited therapy. RN asked PT to defer at this time due to pt with report "I can't breath". PT to return/re-attempt as able.   Lewis Shock, PT, DPT Pager #: [phone] Office #: [phone]

## 2013-03-17 ENCOUNTER — Other Ambulatory Visit: Payer: Self-pay | Admitting: Geriatric Medicine

## 2013-03-17 LAB — GLUCOSE, CAPILLARY
Glucose-Capillary: 161 mg/dL — ABNORMAL HIGH (ref 70–99)
Glucose-Capillary: 142 mg/dL — ABNORMAL HIGH (ref 70–99)

## 2013-03-17 LAB — BASIC METABOLIC PANEL
CO2: 41 mEq/L (ref 19–32)
Chloride: 90 mEq/L — ABNORMAL LOW (ref 96–112)
Glucose, Bld: 118 mg/dL — ABNORMAL HIGH (ref 70–99)
Sodium: 135 mEq/L (ref 135–145)

## 2013-03-17 LAB — PROTIME-INR: INR: 2.83 — ABNORMAL HIGH (ref 0.00–1.49)

## 2013-03-17 MED ORDER — FUROSEMIDE 40 MG PO TABS: 40.0000 mg | ORAL_TABLET | Freq: Every day | ORAL | Status: DC

## 2013-03-17 MED ORDER — TORSEMIDE 20 MG PO TABS: 60.0000 mg | ORAL_TABLET | Freq: Every day | ORAL | Status: DC

## 2013-03-17 MED ORDER — OXYCODONE HCL ER 10 MG PO T12A: 10.0000 mg | EXTENDED_RELEASE_TABLET | Freq: Two times a day (BID) | ORAL | Status: DC

## 2013-03-17 MED ORDER — ALPRAZOLAM 0.25 MG PO TABS: 0.2500 mg | ORAL_TABLET | Freq: Three times a day (TID) | ORAL | Status: DC | PRN

## 2013-03-17 NOTE — Progress Notes (Signed)
Advanced Heart Failure Rounding Note   Subjective:    Gina Ashley is an 79 y.o. female with history of chronic atrial fibrillation on coumadin, diastolic heart failure and HTN. She also has DM type 2, COPD, OSA on CPAP, A Fib- chronic coumadin, and CKD failure with baseline Cr 2.2. She has had 5 hospitalizations in the last 6 months, 1 included tibia/fibula fracture, 1 for UTI, and the rest have been due to massive fluid overload in the setting of diastolic heart failure. Per nephrology she is not a candidate for HD.   She presented to [facility] ED 02/09/13 with increasing abdominal girth. Increased weight noted at SNF 229 pounds. (baseline weight 215 pounds).  Started lasix 15 mg/hr. Lasix stopped 5/11.   Got 2 doses IV lasix yesterday and a dose of sorbitol.   Weight back to baseline 216 pounds.  Cr 1.86>>2.46>2.65>2.63.  Feels much better.  Dyspnea improved.  No chest pain, fever/chills.     Objective:     Vital Signs:   Temp:  [97.8 F (36.6 C)-97.9 F (36.6 C)] 97.8 F (36.6 C) (05/14 1221) Pulse Rate:  [71-79] 71 (05/14 1221) Resp:  [20] 20 (05/14 1221) BP: (102-125)/(66-82) 125/66 mmHg (05/14 1221) SpO2:  [98 %-100 %] 100 % (05/14 1221) Weight:  [98.4 kg (216 lb 14.9 oz)] 98.4 kg (216 lb 14.9 oz) (05/14 0524) Last BM Date: 03/16/13  Weight change: Filed Weights   03/14/13 0555 03/16/13 0333 03/17/13 0524  Weight: 95.8 kg (211 lb 3.2 oz) 98.8 kg (217 lb 13 oz) 98.4 kg (216 lb 14.9 oz)    Intake/Output:   Intake/Output Summary (Last 24 hours) at 03/17/13 1456 Last data filed at 03/17/13 1100  Gross per 24 hour  Intake    480 ml  Output    150 ml  Net    330 ml     Physical Exam: General:  Sitting up in chair.  No visible resp difficulty on  HEENT: normal Neck: supple. JVP elevated but unable to see completely due to body habitus.  Cor: PMI nondisplaced. Regular rate & rhythm. No rubs, gallops or murmurs. Lungs: clear Abdomen: obese soft, nontender, no distention. No  hepatosplenomegaly. No bruits or masses. Good bowel sounds. Extremities: no cyanosis, clubbing, rash, tr edema Neuro: alert & orientedx3, cranial nerves grossly intact. moves all 4 extremities w/o difficulty. Affect pleasant   Labs: Basic Metabolic Panel:  Recent Labs Lab 03/13/13 0742 03/14/13 0435 03/15/13 0526 03/16/13 0432 03/17/13 0525  NA 135 137 137 134* 135  K 3.4* 3.2* 4.3 4.7 4.3  CL 89* 89* 92* 90* 90*  CO2 37* 41* 41* 38* 41*  GLUCOSE 126* 116* 163* 140* 118*  BUN 60* 57* 59* 62* 64*  CREATININE 1.86* 1.98* 2.46* 2.65* 2.63*  CALCIUM 9.6 9.8 9.8 9.7 9.7    Liver Function Tests:  Recent Labs Lab 03/10/13 2117  AST 19  ALT 11  ALKPHOS 121*  BILITOT 0.5  PROT 7.7  ALBUMIN 4.1   No results found for this basename: LIPASE, AMYLASE,  in the last 168 hours No results found for this basename: AMMONIA,  in the last 168 hours  CBC:  Recent Labs Lab 03/10/13 2117 03/13/13 0742  WBC 5.9 5.3  HGB 9.9* 10.2*  HCT 29.4* 31.7*  MCV 83.1 86.4  PLT 89* 100*    Cardiac Enzymes:  Recent Labs Lab 03/11/13 0215 03/11/13 0815  TROPONINI <0.30 <0.30    BNP: BNP (last 3 results)  Recent Labs  02/08/13  2140 03/10/13 2117 03/13/13 0747  PROBNP 3800.0* 4061.0* 4669.0*     Other results:    Imaging: Dg Abd Acute W/chest  03/16/2013   *RADIOLOGY REPORT*  Clinical Data: Shortness breath, abdominal pain, distention.  ACUTE ABDOMEN SERIES (ABDOMEN 2 VIEW & CHEST 1 VIEW)  Comparison: Chest x-ray 03/10/2013.  Abdomen image 10/19/2012.  Findings: There is cardiomegaly with vascular congestion.  Focal right mid and lower lung consolidation with moderate right pleural effusion.  No focal opacity on the left.  Nonobstructive bowel gas pattern.  No free air or organomegaly. Prior cholecystectomy.  Moderate stool burden throughout the colon. No suspicious calcifications.  No acute bony abnormality.  IMPRESSION: Right mid and lower lung consolidation.  Question  pneumonia. Moderate right pleural effusion.  Cardiomegaly, vascular congestion.  Prior cholecystectomy.   Original Report Authenticated By: Gina Ashley, M.D.     Medications:     Scheduled Medications: . allopurinol  100 mg Oral Daily  . amiodarone  400 mg Oral Daily  . busPIRone  5 mg Oral BID  . carvedilol  12.5 mg Oral BID WC  . docusate sodium  100 mg Oral Q8H  . insulin aspart  0-15 Units Subcutaneous TID WC  . insulin aspart  0-5 Units Subcutaneous QHS  . insulin glargine  5 Units Subcutaneous QHS  . isosorbide-hydrALAZINE  1 tablet Oral BID  . multivitamin with minerals  1 tablet Oral Daily  . OxyCODONE  10 mg Oral BID  . pantoprazole  40 mg Oral Daily  . polyethylene glycol  17 g Oral BID  . saccharomyces boulardii  250 mg Oral BID  . sodium chloride  3 mL Intravenous Q12H  . traZODone  50 mg Oral QHS  . Warfarin - Pharmacist Dosing Inpatient   Does not apply q1800    Infusions:    PRN Medications: sodium chloride, acetaminophen, ALPRAZolam, bisacodyl, diphenhydrAMINE, ondansetron (ZOFRAN) IV, oxyCODONE, simethicone, sodium chloride   Assessment:   1. Acute on chronic right sided heart failure  2. Acute on chronic renal failure.  3. HTN  4. Obesity, suspect OHS/OSA  5. Chronic atrial fib  - on coumadin  6. Deconditioning  7. DNR  Plan/Discussion:    Weight back at baseline and Cr stable.  Ok for discharge today on torsemide 60 mg daily.  Have faxed over SNF orders for heart failure to [facility].  Follow up next week in HF clinic.    Daniel Bensimhon,MD 2:56 PM   Length of Stay: 7 Advanced Heart Failure Team Pager [phone] (M-F; 7a - 4p)  Please contact [facility] for night-coverage after hours (4p -7a ) and weekends on [email or web address]

## 2013-03-17 NOTE — Progress Notes (Signed)
Pt discharged per ambulance with all paperwork with ambulance attendants. Daughter wanted copy of d/c and given. Called report to [facility] nursing center. Daughters took all pt belongings.

## 2013-03-17 NOTE — Discharge Summary (Addendum)
Physician Discharge Summary  Gina Ashley MRN: [id number] DOB/AGE: 11/11/1926 79 y.o.  PCP: Kimber Relic, MD   Admit date: 03/10/2013 Discharge date: 03/17/2013  Discharge Diagnoses:   :   Acute on chronic diastolic heart failure Active Problems:   DM   OBSTRUCTIVE SLEEP APNEA   HYPERTENSION   GERD   Obesity (BMI 30-39.9)   Depression   Cardiorenal syndrome with renal failure   Renal failure (ARF), acute on chronic   Hyponatremia   Chronic a-fib   Unspecified constipation     Medication List    TAKE these medications       acetaminophen 325 MG tablet  Commonly known as:  TYLENOL  Take 650 mg by mouth every 6 (six) hours as needed for pain.     albuterol (2.5 MG/3ML) 0.083% nebulizer solution  Commonly known as:  PROVENTIL  Take 3 mLs (2.5 mg total) by nebulization every 2 (two) hours as needed for wheezing or shortness of breath.     allopurinol 100 MG tablet  Commonly known as:  ZYLOPRIM  Take 100 mg by mouth daily.     ALPRAZolam 0.25 MG tablet  Commonly known as:  XANAX  Take 0.25-0.5 mg by mouth every 8 (eight) hours as needed for anxiety.     amiodarone 200 MG tablet  Commonly known as:  PACERONE  Take 2 tablets (400 mg total) by mouth daily.     busPIRone 5 MG tablet  Commonly known as:  BUSPAR  Take 5 mg by mouth every 12 (twelve) hours.     carvedilol 25 MG tablet  Commonly known as:  COREG  Take 25 mg by mouth 2 (two) times daily with a meal.     diphenhydrAMINE 25 mg capsule  Commonly known as:  BENADRYL  Take 25 mg by mouth every 8 (eight) hours as needed for itching.     docusate sodium 100 MG capsule  Commonly known as:  COLACE  Take 100 mg by mouth every 8 (eight) hours.     guaiFENesin 600 MG 12 hr tablet  Commonly known as:  MUCINEX  Take 600 mg by mouth 2 (two) times daily.     insulin aspart 100 UNIT/ML injection  Commonly known as:  novoLOG  Inject 3 Units into the skin 3 (three) times daily with meals. If cbg is  greater than 150     insulin glargine 100 UNIT/ML injection  Commonly known as:  LANTUS  Inject 5 Units into the skin at bedtime.     isosorbide-hydrALAZINE 20-37.5 MG per tablet  Commonly known as:  BIDIL  Take 1 tablet by mouth 2 (two) times daily.     multivitamin with minerals Tabs  Take 1 tablet by mouth daily.     omeprazole 20 MG capsule  Commonly known as:  PRILOSEC  Take 40 mg by mouth daily.     ondansetron 4 MG tablet  Commonly known as:  ZOFRAN  Take 1 tablet (4 mg total) by mouth every 6 (six) hours as needed for nausea.     oxyCODONE 10 MG 12 hr tablet  Commonly known as:  OXYCONTIN  Take 1 tablet (10 mg total) by mouth every 12 (twelve) hours.     oxyCODONE 5 MG immediate release tablet  Commonly known as:  Oxy IR/ROXICODONE  Take 5 mg by mouth every 4 (four) hours as needed for pain (breakthrough).     polyethylene glycol packet  Commonly known as:  MIRALAX /  GLYCOLAX  Take 17 g by mouth 2 (two) times daily.     saccharomyces boulardii 250 MG capsule  Commonly known as:  FLORASTOR  Take 1 capsule (250 mg total) by mouth 2 (two) times daily.     simethicone 80 MG chewable tablet  Commonly known as:  MYLICON  Chew 80 mg by mouth every 6 (six) hours as needed for flatulence.     torsemide 20 MG tablet  Commonly known as:  DEMADEX  Take 3 tablets (60 mg total) by mouth daily.     traZODone 50 MG tablet  Commonly known as:  DESYREL  Take 50 mg by mouth at bedtime.     warfarin 4 MG tablet  Commonly known as:  COUMADIN  Take 4 mg by mouth daily.                   traZODone 50 MG tablet  Commonly known as:  DESYREL  Take 50 mg by mouth at bedtime.     warfarin 4 MG tablet  Commonly known as:  COUMADIN  Take 4 mg by mouth daily.        Discharge Condition:    Disposition: 03-Skilled Nursing Facility   Consults: * Cardiology  Significant Diagnostic Studies: Dg Chest 2 View  03/10/2013   *RADIOLOGY REPORT*  Clinical Data:  Weakness and chest pain.  No shortness of breath.  CHEST - 2 VIEW  Comparison: 02/18/2013.  Findings: Cardiac enlargement.  Right base effusion appears increased.  Right basilar atelectasis.  No infiltrate or overt edema.  Degenerative changes thoracic spine.  IMPRESSION: Increased right pleural effusion.   Original Report Authenticated By: Davonna Belling, M.D.   Dg Chest Portable 1 View  02/18/2013   *RADIOLOGY REPORT*  Clinical Data: Shortness of breath.  Respiratory failure.  Abnormal chest x-ray.  PORTABLE CHEST - 1 VIEW  Comparison: Chest x-ray dated 02/08/2013  Findings: There is slight cardiomegaly.  Pulmonary vascularity is normal and the left lung is clear.  The right effusion has decreased and the infiltrate/atelectasis at the right base has also improved.  No acute osseous abnormality.  IMPRESSION: Improving right base effusion and infiltrate/atelectasis.   Original Report Authenticated By: Francene Boyers, M.D.   Dg Abd Acute W/chest  03/16/2013   *RADIOLOGY REPORT*  Clinical Data: Shortness breath, abdominal pain, distention.  ACUTE ABDOMEN SERIES (ABDOMEN 2 VIEW & CHEST 1 VIEW)  Comparison: Chest x-ray 03/10/2013.  Abdomen image 10/19/2012.  Findings: There is cardiomegaly with vascular congestion.  Focal right mid and lower lung consolidation with moderate right pleural effusion.  No focal opacity on the left.  Nonobstructive bowel gas pattern.  No free air or organomegaly. Prior cholecystectomy.  Moderate stool burden throughout the colon. No suspicious calcifications.  No acute bony abnormality.  IMPRESSION: Right mid and lower lung consolidation.  Question pneumonia. Moderate right pleural effusion.  Cardiomegaly, vascular congestion.  Prior cholecystectomy.   Original Report Authenticated By: Charlett Nose, M.D.       Microbiology: Recent Results (from the past 240 hour(s))  MRSA PCR SCREENING     Status: None   Collection Time    03/11/13  2:22 AM      Result Value Range Status   MRSA  by PCR NEGATIVE  NEGATIVE Final   Comment:            The GeneXpert MRSA Assay (FDA     approved for NASAL specimens     only), is one component of a  comprehensive MRSA colonization     surveillance program. It is not     intended to diagnose MRSA     infection nor to guide or     monitor treatment for     MRSA infections.     Labs: Results for orders placed during the hospital encounter of 03/10/13 (from the past 48 hour(s))  GLUCOSE, CAPILLARY     Status: Abnormal   Collection Time    03/15/13 11:47 AM      Result Value Range   Glucose-Capillary 212 (*) 70 - 99 mg/dL   Comment 1 Notify RN    GLUCOSE, CAPILLARY     Status: Abnormal   Collection Time    03/15/13  4:38 PM      Result Value Range   Glucose-Capillary 153 (*) 70 - 99 mg/dL   Comment 1 Notify RN    GLUCOSE, CAPILLARY     Status: Abnormal   Collection Time    03/15/13  9:19 PM      Result Value Range   Glucose-Capillary 105 (*) 70 - 99 mg/dL   Comment 1 Notify RN    PROTIME-INR     Status: Abnormal   Collection Time    03/16/13  4:32 AM      Result Value Range   Prothrombin Time 23.2 (*) 11.6 - 15.2 seconds   INR 2.16 (*) 0.00 - 1.49  BASIC METABOLIC PANEL     Status: Abnormal   Collection Time    03/16/13  4:32 AM      Result Value Range   Sodium 134 (*) 135 - 145 mEq/L   Potassium 4.7  3.5 - 5.1 mEq/L   Chloride 90 (*) 96 - 112 mEq/L   CO2 38 (*) 19 - 32 mEq/L   Glucose, Bld 140 (*) 70 - 99 mg/dL   BUN 62 (*) 6 - 23 mg/dL   Creatinine, Ser 1.19 (*) 0.50 - 1.10 mg/dL   Calcium 9.7  8.4 - 14.7 mg/dL   GFR calc non Af Amer 16 (*) >90 mL/min   GFR calc Af Amer 18 (*) >90 mL/min   Comment:            The eGFR has been calculated     using the CKD EPI equation.     This calculation has not been     validated in all clinical     situations.     eGFR's persistently     <90 mL/min signify     possible Chronic Kidney Disease.  GLUCOSE, CAPILLARY     Status: Abnormal   Collection Time    03/16/13   6:29 AM      Result Value Range   Glucose-Capillary 141 (*) 70 - 99 mg/dL  GLUCOSE, CAPILLARY     Status: Abnormal   Collection Time    03/16/13  4:47 PM      Result Value Range   Glucose-Capillary 208 (*) 70 - 99 mg/dL   Comment 1 Notify RN    GLUCOSE, CAPILLARY     Status: Abnormal   Collection Time    03/16/13  9:12 PM      Result Value Range   Glucose-Capillary 161 (*) 70 - 99 mg/dL   Comment 1 Notify RN    PROTIME-INR     Status: Abnormal   Collection Time    03/17/13  5:25 AM      Result Value Range   Prothrombin Time 28.3 (*) 11.6 - 15.2  seconds   INR 2.83 (*) 0.00 - 1.49  BASIC METABOLIC PANEL     Status: Abnormal   Collection Time    03/17/13  5:25 AM      Result Value Range   Sodium 135  135 - 145 mEq/L   Potassium 4.3  3.5 - 5.1 mEq/L   Chloride 90 (*) 96 - 112 mEq/L   CO2 41 (*) 19 - 32 mEq/L   Comment: CRITICAL RESULT CALLED TO, READ BACK BY AND VERIFIED WITH:     D HART,RN [id number] WILDERK   Glucose, Bld 118 (*) 70 - 99 mg/dL   BUN 64 (*) 6 - 23 mg/dL   Creatinine, Ser 7.82 (*) 0.50 - 1.10 mg/dL   Calcium 9.7  8.4 - 95.6 mg/dL   GFR calc non Af Amer 16 (*) >90 mL/min   GFR calc Af Amer 18 (*) >90 mL/min   Comment:            The eGFR has been calculated     using the CKD EPI equation.     This calculation has not been     validated in all clinical     situations.     eGFR's persistently     <90 mL/min signify     possible Chronic Kidney Disease.  GLUCOSE, CAPILLARY     Status: Abnormal   Collection Time    03/17/13  5:59 AM      Result Value Range   Glucose-Capillary 109 (*) 70 - 99 mg/dL   Comment 1 Notify RN       HPI :* 79 y.o. female with history of chronic atrial fibrillation on coumadin, diastolic heart failure and HTN. She also has DM type 2, COPD, OSA on CPAP, A Fib- chronic coumadin, and CKD failure with baseline Cr 2.2. She has had 5 hospitalizations in the last 6 months, 1 included tibia/fibula fracture, 1 for UTI, and the rest have  been due to massive fluid overload in the setting of diastolic heart failure. Per nephrology she is not a candidate for HD.  She presented to [facility] ED 02/09/13 with increasing abdominal girth. Increased weight noted at SNF 229 pounds. (baseline weight 215 pounds). Started lasix 15 mg/hr. Lasix stopped 5/11.  Weight back up to 217 pounds after holding lasix. Cr 1.86>>2.46>2.65.    HOSPITAL COURSE:  Assessment/Plan:  #1 acute on chronic right-sided heart failure  May be secondary to recent malfunction or BiPAP and dietary indiscretion at the skilled nursing facility as patient states that everybody eats the same diet. Clinical improvement. Patient's weight last discharge was 215 pounds. Patient was 224 pounds 4 days ago, was 217 3 days ago,211 pounds 2 days ago and 211 yesterday and now 217. Creatinine slightly increased today.  Was on Lasix drip which was discontinued given increasing creatinine, after discontinuation the patient's weight has started going up again she received ED IV Lasix x1 dose yesterday. Creatinine has been stable. The patient will continue on by mouth Lasix. Follow renal function closely.  Recommend low salt diet , cont BIPAP, needs to be in a well ventilated room    #2 acute on chronic kidney disease  Patient's baseline creatinine is approximately 2.2. Creatinine on admission was 3.06. Likely secondary to problem #1/cardiorenal syndrome. Renal function improved with diuresis. Creatinine today is 2.65. Lasix was held yesterday. Patient's weight today is 217 pounds.  Follow renal function closely. Patient has been assessed by the renal team during her last hospitalization and  is deemed not to be a candidate for hemodialysis. continue Lasix at a lower dose, Demodex DC'd  #3 hypertension  Patient blood pressure improved. Continue Coreg and bidil.  #4 hyponatremia  Likely secondary to hypervolemic hyponatremia secondary to acute on chronic CHF exacerbation. Resolved with diuresis.   #5 atrial fibrillation  Continue Coreg and amiodarone for rate control. Coumadin for anticoagulation.  #6 Well controlled type 2 diabetes  HgbA1C = 6.8 01/02/13. CBGs every from 105-153. Continue Lantus. Sliding scale insulin.  #7 obesity  #8 hypokalemia  Secondary to diureses. Repleted.  #9 obstructive sleep apnea  CPAP each bedtime  #10 constipation  Will give sorbitol orally. Check an acute abdominal series.  #11 prophylaxis    On Coumadin for DVT prophylaxis.  Code Status: DO NOT RESUSCITATE  Family Communication: Updated patient and daughter at bedside.  Disposition Plan: SNF  Consultants:  Cardiology/heart failure team: Dr. Gala Romney 03/12/2013 Procedures:  Lower extremity Dopplers 03/12/2013  Chest x-ray 03/10/2013  Acute abdominal series pending       Discharge Exam:   Blood pressure 111/82, pulse 79, temperature 97.9 F (36.6 C), temperature source Oral, resp. rate 20, height 5\' 3"  (1.6 m), weight 98.4 kg (216 lb 14.9 oz), SpO2 98.00%.  General: Sitting up in chair. No visible resp difficulty on  HEENT: normal  Neck: supple. JVP elevated but unable to see completely due to body habitus.  Cor: PMI nondisplaced. Regular rate & rhythm. No rubs, gallops or murmurs.  Lungs: clear  Abdomen: obese soft, nontender, no distention. No hepatosplenomegaly. No bruits or masses. Good bowel sounds.  Extremities: no cyanosis, clubbing, rash, tr- 1+ edema  Neuro: alert & orientedx3, cranial nerves grossly intact. moves all 4 extremities w/o difficulty. Affect pleasant         Future Appointments Provider Department Dept Phone   03/25/2013 10:15 AM [facility] [phone]      Follow-up Information   Schedule an appointment as soon as possible for a visit with GREEN, Lenon Curt, MD. (BMP weekly)    Contact information:   [address] [place] [address] [phone]        Signed: Richarda Overlie 03/17/2013, 11:04 AM         She the AP treat AP

## 2013-03-18 NOTE — Clinical Social Work Placement (Addendum)
    Clinical Social Work Department CLINICAL SOCIAL WORK PLACEMENT NOTE 03/18/2013  Patient:  Gina Ashley, Gina Ashley  Account Number:  [id number] Admit date:  03/10/2013  Clinical Social Worker:  Read Drivers  Date/time:  03/15/2013 12:39 PM  Clinical Social Work is seeking post-discharge placement for this patient at the following level of care:   SKILLED NURSING   (*CSW will update this form in Epic as items are completed)   03/12/2013  Patient/family provided with [facility] Department of Clinical Social Work's list of facilities offering this level of care within the geographic area requested by the patient (or if unable, by the patient's family).  03/12/2013  Patient/family informed of their freedom to choose among providers that offer the needed level of care, that participate in Medicare, Medicaid or managed care program needed by the patient, have an available bed and are willing to accept the patient.  03/12/2013  Patient/family informed of [facility]' ownership interest in [facility], as well as of the fact that they are under no obligation to receive care at this facility.  PASARR submitted to EDS on  PASARR number received from EDS on   FL2 transmitted to all facilities in geographic area requested by pt/family on  03/12/2013 FL2 transmitted to all facilities within larger geographic area on   Patient informed that his/her managed care company has contracts with or will negotiate with  certain facilities, including the following:   NA     Patient/family informed of bed offers received:  03/12/2013 Patient chooses bed at [facility] Physician recommends and patient chooses bed at    Patient to be transferred to [facility] on  03/17/2013 Patient to be transferred to facility by Ambulance  The following physician request were entered in Epic:   Additional Comments: 03-17-13. Bed offers given to patient and  daughter- they refuse all available bed offers and chose to return to [place].  Patient stated "the therapy is good there but the nursing care needs alot of work".  She wants rehab to be able to return home.  CSW notified SNF and pt's nurse of d/c plan.  CSW signing off.  Lorri Frederick. Torria Fromer, LCSWA  [phone]

## 2013-03-19 ENCOUNTER — Inpatient Hospital Stay (HOSPITAL_COMMUNITY)
Admission: EM | Admit: 2013-03-19 | Discharge: 2013-03-30 | DRG: 291 | Disposition: A | Discharge status: Skilled Nursing Facility | Payer: Medicare Other | Attending: Internal Medicine | Admitting: Internal Medicine

## 2013-03-19 ENCOUNTER — Emergency Department (HOSPITAL_COMMUNITY): Payer: Medicare Other

## 2013-03-19 ENCOUNTER — Encounter (HOSPITAL_COMMUNITY): Payer: Self-pay

## 2013-03-19 DIAGNOSIS — M25559 Pain in unspecified hip: Secondary | ICD-10-CM

## 2013-03-19 DIAGNOSIS — N179 Acute kidney failure, unspecified: Secondary | ICD-10-CM | POA: Diagnosis present

## 2013-03-19 DIAGNOSIS — K219 Gastro-esophageal reflux disease without esophagitis: Secondary | ICD-10-CM | POA: Diagnosis present

## 2013-03-19 DIAGNOSIS — J449 Chronic obstructive pulmonary disease, unspecified: Secondary | ICD-10-CM

## 2013-03-19 DIAGNOSIS — N189 Chronic kidney disease, unspecified: Secondary | ICD-10-CM

## 2013-03-19 DIAGNOSIS — Z96659 Presence of unspecified artificial knee joint: Secondary | ICD-10-CM

## 2013-03-19 DIAGNOSIS — D638 Anemia in other chronic diseases classified elsewhere: Secondary | ICD-10-CM

## 2013-03-19 DIAGNOSIS — Z79899 Other long term (current) drug therapy: Secondary | ICD-10-CM

## 2013-03-19 DIAGNOSIS — J9819 Other pulmonary collapse: Secondary | ICD-10-CM | POA: Diagnosis present

## 2013-03-19 DIAGNOSIS — E119 Type 2 diabetes mellitus without complications: Secondary | ICD-10-CM

## 2013-03-19 DIAGNOSIS — I130 Hypertensive heart and chronic kidney disease with heart failure and stage 1 through stage 4 chronic kidney disease, or unspecified chronic kidney disease: Principal | ICD-10-CM | POA: Diagnosis present

## 2013-03-19 DIAGNOSIS — I4892 Unspecified atrial flutter: Secondary | ICD-10-CM | POA: Diagnosis not present

## 2013-03-19 DIAGNOSIS — K449 Diaphragmatic hernia without obstruction or gangrene: Secondary | ICD-10-CM | POA: Diagnosis present

## 2013-03-19 DIAGNOSIS — R0902 Hypoxemia: Secondary | ICD-10-CM

## 2013-03-19 DIAGNOSIS — M109 Gout, unspecified: Secondary | ICD-10-CM | POA: Diagnosis present

## 2013-03-19 DIAGNOSIS — Z515 Encounter for palliative care: Secondary | ICD-10-CM

## 2013-03-19 DIAGNOSIS — J4489 Other specified chronic obstructive pulmonary disease: Secondary | ICD-10-CM | POA: Diagnosis present

## 2013-03-19 DIAGNOSIS — G8929 Other chronic pain: Secondary | ICD-10-CM | POA: Diagnosis present

## 2013-03-19 DIAGNOSIS — R0602 Shortness of breath: Secondary | ICD-10-CM

## 2013-03-19 DIAGNOSIS — Z228 Carrier of other infectious diseases: Secondary | ICD-10-CM

## 2013-03-19 DIAGNOSIS — J9610 Chronic respiratory failure, unspecified whether with hypoxia or hypercapnia: Secondary | ICD-10-CM | POA: Diagnosis present

## 2013-03-19 DIAGNOSIS — Z794 Long term (current) use of insulin: Secondary | ICD-10-CM

## 2013-03-19 DIAGNOSIS — I5033 Acute on chronic diastolic (congestive) heart failure: Secondary | ICD-10-CM

## 2013-03-19 DIAGNOSIS — I509 Heart failure, unspecified: Secondary | ICD-10-CM

## 2013-03-19 DIAGNOSIS — R600 Localized edema: Secondary | ICD-10-CM

## 2013-03-19 DIAGNOSIS — I4891 Unspecified atrial fibrillation: Secondary | ICD-10-CM | POA: Diagnosis present

## 2013-03-19 DIAGNOSIS — Z66 Do not resuscitate: Secondary | ICD-10-CM | POA: Diagnosis present

## 2013-03-19 DIAGNOSIS — R791 Abnormal coagulation profile: Secondary | ICD-10-CM | POA: Diagnosis present

## 2013-03-19 DIAGNOSIS — I10 Essential (primary) hypertension: Secondary | ICD-10-CM

## 2013-03-19 DIAGNOSIS — Z880 Allergy status to penicillin: Secondary | ICD-10-CM

## 2013-03-19 DIAGNOSIS — R5381 Other malaise: Secondary | ICD-10-CM | POA: Diagnosis not present

## 2013-03-19 DIAGNOSIS — Z888 Allergy status to other drugs, medicaments and biological substances status: Secondary | ICD-10-CM

## 2013-03-19 DIAGNOSIS — I1310 Hypertensive heart and chronic kidney disease without heart failure, with stage 1 through stage 4 chronic kidney disease, or unspecified chronic kidney disease: Secondary | ICD-10-CM

## 2013-03-19 DIAGNOSIS — E662 Morbid (severe) obesity with alveolar hypoventilation: Secondary | ICD-10-CM | POA: Diagnosis present

## 2013-03-19 DIAGNOSIS — M549 Dorsalgia, unspecified: Secondary | ICD-10-CM

## 2013-03-19 DIAGNOSIS — D6832 Hemorrhagic disorder due to extrinsic circulating anticoagulants: Secondary | ICD-10-CM

## 2013-03-19 DIAGNOSIS — J90 Pleural effusion, not elsewhere classified: Secondary | ICD-10-CM

## 2013-03-19 DIAGNOSIS — N184 Chronic kidney disease, stage 4 (severe): Secondary | ICD-10-CM | POA: Diagnosis present

## 2013-03-19 DIAGNOSIS — F411 Generalized anxiety disorder: Secondary | ICD-10-CM

## 2013-03-19 DIAGNOSIS — T45515A Adverse effect of anticoagulants, initial encounter: Secondary | ICD-10-CM | POA: Diagnosis present

## 2013-03-19 DIAGNOSIS — G4733 Obstructive sleep apnea (adult) (pediatric): Secondary | ICD-10-CM

## 2013-03-19 DIAGNOSIS — M25552 Pain in left hip: Secondary | ICD-10-CM

## 2013-03-19 DIAGNOSIS — R531 Weakness: Secondary | ICD-10-CM

## 2013-03-19 DIAGNOSIS — I482 Chronic atrial fibrillation, unspecified: Secondary | ICD-10-CM

## 2013-03-19 DIAGNOSIS — F3289 Other specified depressive episodes: Secondary | ICD-10-CM | POA: Diagnosis present

## 2013-03-19 DIAGNOSIS — Z7901 Long term (current) use of anticoagulants: Secondary | ICD-10-CM

## 2013-03-19 DIAGNOSIS — F329 Major depressive disorder, single episode, unspecified: Secondary | ICD-10-CM | POA: Diagnosis present

## 2013-03-19 LAB — CBC WITH DIFFERENTIAL/PLATELET
Basophils Absolute: 0 10*3/uL (ref 0.0–0.1)
Lymphocytes Relative: 16 % (ref 12–46)
Lymphs Abs: 0.9 10*3/uL (ref 0.7–4.0)
Neutro Abs: 4.5 10*3/uL (ref 1.7–7.7)
Platelets: 104 10*3/uL — ABNORMAL LOW (ref 150–400)
RBC: 3.52 MIL/uL — ABNORMAL LOW (ref 3.87–5.11)
RDW: 17 % — ABNORMAL HIGH (ref 11.5–15.5)
WBC: 5.9 10*3/uL (ref 4.0–10.5)

## 2013-03-19 LAB — POCT I-STAT TROPONIN I

## 2013-03-19 LAB — COMPREHENSIVE METABOLIC PANEL
ALT: 10 U/L (ref 0–35)
AST: 18 U/L (ref 0–37)
Alkaline Phosphatase: 109 U/L (ref 39–117)
CO2: 34 mEq/L — ABNORMAL HIGH (ref 19–32)
Chloride: 89 mEq/L — ABNORMAL LOW (ref 96–112)
GFR calc non Af Amer: 14 mL/min — ABNORMAL LOW (ref >90)
Potassium: 4.7 mEq/L (ref 3.5–5.1)
Sodium: 131 mEq/L — ABNORMAL LOW (ref 135–145)
Total Bilirubin: 0.6 mg/dL (ref 0.3–1.2)

## 2013-03-19 LAB — PRO B NATRIURETIC PEPTIDE: Pro B Natriuretic peptide (BNP): 4611 pg/mL — ABNORMAL HIGH (ref 0–450)

## 2013-03-19 MED ORDER — FENTANYL CITRATE 0.05 MG/ML IJ SOLN
50.0000 ug | Freq: Once | INTRAMUSCULAR | Status: AC
  Administered 2013-03-19: 50 ug via INTRAVENOUS
  Filled 2013-03-19: qty 2

## 2013-03-19 NOTE — ED Notes (Signed)
Pt c/o general weakness and epigastric pain/tightness when she sits up in bed. NAD noted at this time. Denies cp. States it is hard to take a deep breath. Pt states "I feel like I got a lot of fluid." Pt alert and oriented.

## 2013-03-19 NOTE — ED Notes (Signed)
MD at bedside. 

## 2013-03-19 NOTE — ED Provider Notes (Signed)
History     CSN: [id number]  Arrival date & time 03/19/13  2044   First MD Initiated Contact with Patient 03/19/13 2055      Chief Complaint  Patient presents with  . Abdominal Pain    (Consider location/radiation/quality/duration/timing/severity/associated sxs/prior treatment) HPI  PATIENT IS DNR per previous records  Pt comes to the  CHF, CKD stage IV, cardiorenal syndrome, hypertension,atrial fibrillation, and obstructive sleep apnea presents with increasing abdominal girth, shortness of breath and lower extremity edema for the past 3-4 days. The patient was recently discharged from the hospital after a stay from 02/08/2013 to 02/12/2013 for CHF exacerbation. The patient was discharged to [facility] nursing facility. The patient has been using BiPAP in the evenings, but has not been able to use her machine for the last 2 nights because of some mechanical malfunction. The patient endorses compliance with her medications. Unfortunately, she states that over the past week she has noted worsening abdominal girth to the point that she cannot fit into her pants. In addition she has noted some increase in her bilateral lower extremity edema, left greater than right. She states that her breathing is not much worse than usual. She denies fevers, chills, chest discomfort, nausea, vomiting, diarrhea, abdominal pain, dysuria, hematuria. The patient normally wears 2 L nasal cannula 24 hours a day. At the nursing facility, the patient had a weight of 229 pounds this morning. She states that this is much worse than usual for her.   She was seen earlier today at another facility but told that she was okay and did not need to be transported to the hospital therefore she had her family member get here and bring her to the ER. The patient is concerned that something is wrong and that her symptoms are the same as her previous CHF exacerbation that required admission so her family members brought her to the ED.  Nad/ vss   Past Medical History  Diagnosis Date  . GERD (gastroesophageal reflux disease)   . Hiatal hernia   . HTN (hypertension)     all her life  . CKD (chronic kidney disease), stage III     GFR: 38  . Atrial fibrillation     since 1996  . Depression   . Chronic diastolic heart failure 08/31/2012  . Tibia fracture     BILATERAL  . Renal failure     acute on chronic   . Respiratory failure     acute on chronic hx of  requiring BIPAP  . COPD (chronic obstructive pulmonary disease)   . Metabolic encephalopathy     hx of   . Renal failure     acute on chronic with need for intermittent dialysis - hx of   . UTI (urinary tract infection)     hx of   . Pleural effusion     right sided now resolved   . CHF (congestive heart failure)     diastolic HF  . Complication of anesthesia     "hard to wake up" (03/11/2013)  . OSA on CPAP   . Type II diabetes mellitus 1980's?  . History of blood transfusion 08/2012    "related to leg OR" (03/11/2013)  . Arthritis     "left arm; lower back, hips, hands" (03/11/2013)  . Chronic lower back pain     "disc pops in and out" (03/11/2013)  . Gout   . Anxiety   . Fall 08/2012    "in nursing home" (03/11/2013)  .  Anemia   . Chronic a-fib 03/12/2013    Past Surgical History  Procedure Laterality Date  . Bunionectomy Bilateral     bilateral  . Cholecystectomy    . Shoulder arthroscopy w/ rotator cuff repair Left 2002  . Total knee arthroplasty Bilateral 2001  . Hammer toe surgery Bilateral 2004  . Colonoscopy  07/11/2011    Procedure: COLONOSCOPY;  Surgeon: Malissa Hippo, MD;  [facility];  Service: Endoscopy;  Laterality: N/A;  3:00  . Orif periprosthetic fracture  09/08/2012    Procedure: OPEN REDUCTION INTERNAL FIXATION (ORIF) PERIPROSTHETIC FRACTURE;  Surgeon: Shelda Pal, MD;  Location: [facility];  Service: Orthopedics;  Laterality: Right;  ORIF periprosthetic right proximal femur fracture   . External fixation leg  09/08/2012     Procedure: EXTERNAL FIXATION LEG;  Surgeon: Shelda Pal, MD;  Location: [facility];  Service: Orthopedics;  Laterality: Left;  . Joint replacement    . External fixation leg  11/30/2012    Procedure: EXTERNAL FIXATION LEG;  Surgeon: Shelda Pal, MD;  Location: [facility];  Service: Orthopedics;  Laterality: Left;  Removal of External Fixation Left Knee with Evaluation with Floroscopy  . Tee without cardioversion N/A 01/19/2013    Procedure: TRANSESOPHAGEAL ECHOCARDIOGRAM (TEE);  Surgeon: Lewayne Bunting, MD;  Location: [facility] ENDOSCOPY;  Service: Cardiovascular;  Laterality: N/A;  . Shoulder open rotator cuff repair Left 2002    "maybe 3 months after scope" (03/11/2013)  . Femur fracture surgery Right 08/2012    "fell at nursing home" (03/11/2013)  . Tibia fracture surgery Left 08/2012    "fell at nursing home" (03/11/2013)    Family History  Problem Relation Age of Onset  . Colon cancer Brother     History  Substance Use Topics  . Smoking status: Never Smoker   . Smokeless tobacco: Never Used  . Alcohol Use: No    OB History   Grav Para Term Preterm Abortions TAB SAB Ect Mult Living                  Review of Systems  All other systems reviewed and are negative.    Allergies  Morphine sulfate; Remeron; Tuna; and Penicillins  Home Medications   Current Outpatient Rx  Name  Route  Sig  Dispense  Refill  . acetaminophen (TYLENOL) 325 MG tablet   Oral   Take 650 mg by mouth every 6 (six) hours as needed for pain.         Marland Kitchen albuterol (PROVENTIL) (2.5 MG/3ML) 0.083% nebulizer solution   Nebulization   Take 3 mLs (2.5 mg total) by nebulization every 2 (two) hours as needed for wheezing or shortness of breath.   75 mL   12   . allopurinol (ZYLOPRIM) 100 MG tablet   Oral   Take 100 mg by mouth daily.         Marland Kitchen ALPRAZolam (XANAX) 0.25 MG tablet   Oral   Take 0.25 mg by mouth every 8 (eight) hours as needed for anxiety.         Marland Kitchen amiodarone (PACERONE) 200 MG tablet    Oral   Take 400 mg by mouth daily.         . busPIRone (BUSPAR) 5 MG tablet   Oral   Take 5 mg by mouth every 12 (twelve) hours.         . carvedilol (COREG) 25 MG tablet   Oral   Take 25 mg by  mouth 2 (two) times daily with a meal.         . diphenhydrAMINE (BENADRYL) 25 mg capsule   Oral   Take 25 mg by mouth every 8 (eight) hours as needed for itching.         . docusate sodium (COLACE) 100 MG capsule   Oral   Take 100 mg by mouth every 8 (eight) hours.          . furosemide (LASIX) 40 MG tablet   Oral   Take 40 mg by mouth daily.         Marland Kitchen guaiFENesin (MUCINEX) 600 MG 12 hr tablet   Oral   Take 600 mg by mouth 2 (two) times daily.          . insulin aspart (NOVOLOG) 100 UNIT/ML injection   Subcutaneous   Inject 3 Units into the skin 3 (three) times daily with meals. If cbg is greater than 150         . insulin glargine (LANTUS) 100 UNIT/ML injection   Subcutaneous   Inject 5 Units into the skin at bedtime.         . isosorbide-hydrALAZINE (BIDIL) 20-37.5 MG per tablet   Oral   Take 1 tablet by mouth 2 (two) times daily.         . Multiple Vitamin (MULTIVITAMIN WITH MINERALS) TABS   Oral   Take 1 tablet by mouth daily.         . nitroGLYCERIN (NITROSTAT) 0.4 MG SL tablet   Sublingual   Place 0.4 mg under the tongue every 5 (five) minutes as needed for chest pain.         Marland Kitchen omeprazole (PRILOSEC) 20 MG capsule   Oral   Take 40 mg by mouth daily.         . ondansetron (ZOFRAN) 4 MG tablet   Oral   Take 1 tablet (4 mg total) by mouth every 6 (six) hours as needed for nausea.   20 tablet      . oxyCODONE (OXY IR/ROXICODONE) 5 MG immediate release tablet   Oral   Take 5 mg by mouth every 4 (four) hours as needed for pain (breakthrough).          . OxyCODONE (OXYCONTIN) 10 mg T12A   Oral   Take 1 tablet (10 mg total) by mouth every 12 (twelve) hours.   60 tablet   0   . polyethylene glycol (MIRALAX / GLYCOLAX) packet   Oral    Take 17 g by mouth 2 (two) times daily.          Marland Kitchen saccharomyces boulardii (FLORASTOR) 250 MG capsule   Oral   Take 1 capsule (250 mg total) by mouth 2 (two) times daily.         . simethicone (MYLICON) 80 MG chewable tablet   Oral   Chew 80 mg by mouth every 6 (six) hours as needed for flatulence.         . traZODone (DESYREL) 50 MG tablet   Oral   Take 50 mg by mouth at bedtime.         Marland Kitchen warfarin (COUMADIN) 3 MG tablet   Oral   Take 3 mg by mouth daily.           BP 104/64  Pulse 75  Temp(Src) 97.9 F (36.6 C) (Oral)  Resp 23  SpO2 97%  Physical Exam  Nursing note and vitals reviewed. Constitutional: She is oriented  to person, place, and time. She appears well-developed and well-nourished. No distress.  HENT:  Head: Normocephalic and atraumatic.  Eyes: Pupils are equal, round, and reactive to light.  Neck: Normal range of motion. Neck supple.  Cardiovascular: Normal rate and regular rhythm.   Pulmonary/Chest: Effort normal. No respiratory distress. She has wheezes.  Patient has on 2L [place]  Abdominal: Soft. Bowel sounds are normal. She exhibits no shifting dullness, no distension, no pulsatile liver, no fluid wave and no ascites. There is no hepatosplenomegaly. There is no tenderness. There is no CVA tenderness.  Mild abdominal distention.    Musculoskeletal:  3+ lower extremity edema  Neurological: She is alert and oriented to person, place, and time.  Skin: Skin is warm and dry.    ED Course  Procedures (including critical care time)  Labs Reviewed  PRO B NATRIURETIC PEPTIDE - Abnormal; Notable for the following:    Pro B Natriuretic peptide (BNP) 4611.0 (*)    All other components within normal limits  CBC WITH DIFFERENTIAL - Abnormal; Notable for the following:    RBC 3.52 (*)    Hemoglobin 9.6 (*)    HCT 30.5 (*)    RDW 17.0 (*)    Platelets 104 (*)    All other components within normal limits  COMPREHENSIVE METABOLIC PANEL - Abnormal;  Notable for the following:    Sodium 131 (*)    Chloride 89 (*)    CO2 34 (*)    Glucose, Bld 202 (*)    BUN 68 (*)    Creatinine, Ser 2.84 (*)    GFR calc non Af Amer 14 (*)    GFR calc Af Amer 17 (*)    All other components within normal limits  LIPASE, BLOOD  URINALYSIS, ROUTINE W REFLEX MICROSCOPIC  POCT I-STAT TROPONIN I   Dg Chest 2 View  03/19/2013   *RADIOLOGY REPORT*  Clinical Data: Chest and abdominal pain  CHEST - 2 VIEW  Comparison: 03/16/2013  Findings: Moderate cardiac enlargement is again identified. Airspace consolidation within the superior segment of right lower lobe and right middle lobe is again noted and does not appear significantly changed from previous exam.  The left lung is clear. No pleural effusion is identified.  IMPRESSION:  1.  Persistent consolidation involving the right middle lobe and superior segment of right lower lobe. 2.  Cardiac enlargement.   Original Report Authenticated By: Signa Kell, M.D.     1. Chronic CHF   2. Shortness of breath   3. Lower extremity edema       MDM  Patient is stable and no significant acute changes noted however she continues to have CHF which subjectively is worsening with some renal dysfunction. Dr. Manus Gunning feels it would be best to admit her. I have spoken with [facility] who has agreed to place her in obs.  [facility], [facility], Tele, observation, Team [address]        Dorthula Matas, [place] 03/20/13 0023

## 2013-03-19 NOTE — ED Notes (Signed)
Per ems-Pt c/o epigastric pain, described as pressure building up. MD at [facility] saw pt twice today and stated that pt was fine and did not need to be transported. Pt in NAD. Pain worse with eating. BP-166/82 HR-76 Pt on 3L O2 at home. CBG-235

## 2013-03-19 NOTE — Progress Notes (Signed)
Date: 03/19/2013  MRN:  [id number] Name:  Gina Ashley Sex:  female Age:  79 y.o. DOB:07-Dec-1926   [facility]:                       Facility/Room; Level Of Care: Provider:   Emergency Contacts: Contact Information   Name Relation Home Work [place] Daughter [phone]  [phone]      Code Status: MOST Form:  Allergies: Allergies  Allergen Reactions  . Morphine Sulfate Other (See Comments)    REACTION: change in personality  . Remeron (Mirtazapine) Other (See Comments)    Altered mental status, lethargy  . Tuna (Fish Allergy) Other (See Comments)    unknown  . Penicillins Rash    Tolerates Ancef.     Chief Complaint  Patient presents with  . Medical Managment of Chronic Issues    Re-admit to SNF following hospitalization     HPI:79 year old female with a history of diastolic CHF, CKD stage IV, cardiorenal syndrome, hypertension,atrial fibrillation, and obstructive sleep apnea presents with increasing abdominal girth and lower extremity edema for the past 3-4 days. The patient was recently discharged from the hospital after a stay from 02/08/2013 to 02/12/2013 for CHF exacerbation. The patient was discharged to [facility] nursing facility. The patient has been using BiPAP in the evenings, but has not been able to use her machine for the last 2 nights because of some mechanical malfunction. The patient endorses compliance with her medications. Unfortunately, she states that over the past week she has noted worsening abdominal girth to the point that she cannot fit into her pants. In addition she has noted some increase in her bilateral lower extremity edema, left greater than right. She states that her breathing is not much worse than usual. She denies fevers, chills, chest discomfort, nausea, vomiting, diarrhea, abdominal pain, dysuria, hematuria. The patient normally wears 2 L nasal cannula 24 hours a day. At the nursing facility, the patient had a weight of 229  pounds this morning. She states that this is much worse than usual for her.  In the emergency department, the patient was noted to be hyponatremic with a sodium 127, serum creatinine 3.06, platelets 89,000. Her proBNP was 4061. Chest x-ray showed poor inspiration with interstitial prominence and cardiomegaly. EKG was atrial fibrillation rate controlled. The patient was ordered to have 80 mg IV furosemide.Was discharged 03/17/2013 back to [facility] for [facility].    Past Medical History  Diagnosis Date  . GERD (gastroesophageal reflux disease)   . Hiatal hernia   . HTN (hypertension)     all her life  . CKD (chronic kidney disease), stage III     GFR: 38  . Atrial fibrillation     since 1996  . Depression   . Chronic diastolic heart failure 08/31/2012  . Tibia fracture     BILATERAL  . Renal failure     acute on chronic   . Respiratory failure     acute on chronic hx of  requiring BIPAP  . COPD (chronic obstructive pulmonary disease)   . Metabolic encephalopathy     hx of   . Renal failure     acute on chronic with need for intermittent dialysis - hx of   . UTI (urinary tract infection)     hx of   . Pleural effusion     right sided now resolved   . CHF (congestive heart failure)     diastolic HF  .  Complication of anesthesia     "hard to wake up" (03/11/2013)  . OSA on CPAP   . Type II diabetes mellitus 1980's?  . History of blood transfusion 08/2012    "related to leg OR" (03/11/2013)  . Arthritis     "left arm; lower back, hips, hands" (03/11/2013)  . Chronic lower back pain     "disc pops in and out" (03/11/2013)  . Gout   . Anxiety   . Fall 08/2012    "in nursing home" (03/11/2013)  . Anemia   . Chronic a-fib 03/12/2013    Past Surgical History  Procedure Laterality Date  . Bunionectomy Bilateral     bilateral  . Cholecystectomy    . Shoulder arthroscopy w/ rotator cuff repair Left 2002  . Total knee arthroplasty Bilateral 2001  . Hammer toe surgery Bilateral 2004  .  Colonoscopy  07/11/2011    Procedure: COLONOSCOPY;  Surgeon: Malissa Hippo, MD;  [facility];  Service: Endoscopy;  Laterality: N/A;  3:00  . Orif periprosthetic fracture  09/08/2012    Procedure: OPEN REDUCTION INTERNAL FIXATION (ORIF) PERIPROSTHETIC FRACTURE;  Surgeon: Shelda Pal, MD;  Location: [facility];  Service: Orthopedics;  Laterality: Right;  ORIF periprosthetic right proximal femur fracture   . External fixation leg  09/08/2012    Procedure: EXTERNAL FIXATION LEG;  Surgeon: Shelda Pal, MD;  Location: [facility];  Service: Orthopedics;  Laterality: Left;  . Joint replacement    . External fixation leg  11/30/2012    Procedure: EXTERNAL FIXATION LEG;  Surgeon: Shelda Pal, MD;  Location: [facility];  Service: Orthopedics;  Laterality: Left;  Removal of External Fixation Left Knee with Evaluation with Floroscopy  . Tee without cardioversion N/A 01/19/2013    Procedure: TRANSESOPHAGEAL ECHOCARDIOGRAM (TEE);  Surgeon: Lewayne Bunting, MD;  Location: [facility] ENDOSCOPY;  Service: Cardiovascular;  Laterality: N/A;  . Shoulder open rotator cuff repair Left 2002    "maybe 3 months after scope" (03/11/2013)  . Femur fracture surgery Right 08/2012    "fell at nursing home" (03/11/2013)  . Tibia fracture surgery Left 08/2012    "fell at nursing home" (03/11/2013)     Procedures: 03/10/2013 Chest x-ray  Weakness and chest pain. No shortness of breath. CHEST - 2 VIEW Comparison: 02/18/2013. Findings: Cardiac enlargement. Right base effusion appears increased. Right basilar atelectasis. No infiltrate or overt edema. Degenerative changes thoracic spine. IMPRESSION: Increased right pleural effusion. Original Report Authenticated By: Davonna Belling, M.D.   02/18/2013 Chest x-ray  Clinical Data: Shortness of breath. Respiratory failure. Abnormal chest x-ray. PORTABLE CHEST - 1 VIEW Comparison: Chest x-ray dated 02/08/2013 Findings: There is slight cardiomegaly. Pulmonary vascularity is normal and the left lung  is clear. The right effusion has decreased and the infiltrate/atelectasis at the right base has also improved. No acute osseous abnormality. IMPRESSION: Improving right base effusion and infiltrate/atelectasis. Original Report Authenticated By: Francene Boyers, M.D.   03/16/2013 Abdominal x-ray w/chest  Clinical Data: Shortness breath, abdominal pain, distention. ACUTE ABDOMEN SERIES (ABDOMEN 2 VIEW & CHEST 1 VIEW) Comparison: Chest x-ray 03/10/2013. Abdomen image 10/19/2012. Findings: There is cardiomegaly with vascular congestion. Focal right mid and lower lung consolidation with moderate right pleural effusion. No focal opacity on the left. Nonobstructive bowel gas pattern. No free air or organomegaly. Prior cholecystectomy. Moderate stool burden throughout the colon. No suspicious calcifications. No acute bony abnormality. IMPRESSION: Right mid and lower lung consolidation. Question pneumonia. Moderate right pleural effusion. Cardiomegaly, vascular congestion. Prior  cholecystectomy. Original Report Authenticated By: Charlett Nose, M.D.     Consultants:  Dr. Harolyn Rutherford [facility]   Current Outpatient Prescriptions  Medication Sig Dispense Refill  . acetaminophen (TYLENOL) 325 MG tablet Take 650 mg by mouth every 6 (six) hours as needed for pain.      Marland Kitchen albuterol (PROVENTIL) (2.5 MG/3ML) 0.083% nebulizer solution Take 3 mLs (2.5 mg total) by nebulization every 2 (two) hours as needed for wheezing or shortness of breath.  75 mL  12  . allopurinol (ZYLOPRIM) 100 MG tablet Take 100 mg by mouth daily.      Marland Kitchen ALPRAZolam (XANAX) 0.25 MG tablet Take 1-2 tablets (0.25-0.5 mg total) by mouth every 8 (eight) hours as needed for anxiety.  90 tablet  3  . amiodarone (PACERONE) 200 MG tablet Take 2 tablets (400 mg total) by mouth daily.      . busPIRone (BUSPAR) 5 MG tablet Take 5 mg by mouth every 12 (twelve) hours.      . carvedilol (COREG) 25 MG tablet Take 25 mg by mouth 2 (two) times daily with a meal.       . diphenhydrAMINE (BENADRYL) 25 mg capsule Take 25 mg by mouth every 8 (eight) hours as needed for itching.      . docusate sodium (COLACE) 100 MG capsule Take 100 mg by mouth every 8 (eight) hours.       Marland Kitchen guaiFENesin (MUCINEX) 600 MG 12 hr tablet Take 600 mg by mouth 2 (two) times daily.       . insulin aspart (NOVOLOG) 100 UNIT/ML injection Inject 3 Units into the skin 3 (three) times daily with meals. If cbg is greater than 150      . insulin glargine (LANTUS) 100 UNIT/ML injection Inject 5 Units into the skin at bedtime.      . isosorbide-hydrALAZINE (BIDIL) 20-37.5 MG per tablet Take 1 tablet by mouth 2 (two) times daily.      . Multiple Vitamin (MULTIVITAMIN WITH MINERALS) TABS Take 1 tablet by mouth daily.      Marland Kitchen omeprazole (PRILOSEC) 20 MG capsule Take 40 mg by mouth daily.      . ondansetron (ZOFRAN) 4 MG tablet Take 1 tablet (4 mg total) by mouth every 6 (six) hours as needed for nausea.  20 tablet    . oxyCODONE (OXY IR/ROXICODONE) 5 MG immediate release tablet Take 5 mg by mouth every 4 (four) hours as needed for pain (breakthrough).       Marland Kitchen oxyCODONE (OXYCONTIN) 10 MG 12 hr tablet Take 1 tablet (10 mg total) by mouth every 12 (twelve) hours.  20 tablet  0  . OxyCODONE (OXYCONTIN) 10 mg T12A Take 1 tablet (10 mg total) by mouth every 12 (twelve) hours.  60 tablet  0  . polyethylene glycol (MIRALAX / GLYCOLAX) packet Take 17 g by mouth 2 (two) times daily.       Marland Kitchen saccharomyces boulardii (FLORASTOR) 250 MG capsule Take 1 capsule (250 mg total) by mouth 2 (two) times daily.      . simethicone (MYLICON) 80 MG chewable tablet Chew 80 mg by mouth every 6 (six) hours as needed for flatulence.      . torsemide (DEMADEX) 20 MG tablet Take 3 tablets (60 mg total) by mouth daily.  90 tablet  0  . traZODone (DESYREL) 50 MG tablet Take 50 mg by mouth at bedtime.      Marland Kitchen warfarin (COUMADIN) 4 MG tablet Take 4 mg by mouth daily.  No current facility-administered medications for this visit.     Immunization History  Administered Date(s) Administered  . Influenza Split 08/24/2012  . Influenza-Generic 10/04/2012  . Pneumococcal-Generic 11/04/2010     Diet:  History  Substance Use Topics  . Smoking status: Never Smoker   . Smokeless tobacco: Never Used  . Alcohol Use: No    Family History  Problem Relation Age of Onset  . Colon cancer Brother        Vital signs: BP 143/80  Pulse 76  Resp 20  Ht 5\' 3"  (1.6 m)  Wt 223 lb 6.4 oz (101.334 kg)  BMI 39.58 kg/m2   Screening Score  MMS    PHQ2    PHQ9     Fall Risk    BIMS    Annual summary: Hospitalizations:  Problem List as of 03/19/2013   Persistent Atrial flutter   Last Assessment & Plan   02/23/2013 Hospital Encounter Written 02/24/2013  9:39 AM by Hadassah Pais, PA-C     Continue amiodarone and coumadin.  No signs of bleeding.    Chronic diastolic heart failure   Last Assessment & Plan   02/23/2013 Hospital Encounter Written 02/24/2013  9:38 AM by Hadassah Pais, PA-C     She continues to do very well at the facility.  Volume status well controlled.  Will repeat BMET today, Cr climbing last week in ER.  If Cr remains high will cut back metolazone.  See back in 3 weeks to determine if weight remains stable.  Have requested [facility] to start weighing patient daily and fax weights every Monday.      Chronic a-fib   DM   OBSTRUCTIVE SLEEP APNEA   HYPERTENSION   Last Assessment & Plan   01/27/2013 Hospital Encounter Written 01/27/2013  5:28 PM by Hadassah Pais, PA-C     Currently controlled, will continue to monitor.    GERD   COPD (chronic obstructive pulmonary disease)   Anemia due to chronic illness   CKD (chronic kidney disease)   Last Assessment & Plan   02/23/2013 Hospital Encounter Written 02/24/2013  9:40 AM by Hadassah Pais, PA-C     Will check BMET today.  Baseline Cr 1.8-2.    Obesity (BMI 30-39.9)   Depression   Shortness of breath   Cardiorenal syndrome with renal failure    Last Assessment & Plan   01/27/2013 Hospital Encounter Written 01/27/2013  5:29 PM by Hadassah Pais, PA-C     Check BMET today.  Baseline Cr 1.8-2.0.  Continue current diuretics.  No ACE-I with renal failure.     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

## 2013-03-20 ENCOUNTER — Encounter (HOSPITAL_COMMUNITY): Payer: Self-pay | Admitting: *Deleted

## 2013-03-20 DIAGNOSIS — I10 Essential (primary) hypertension: Secondary | ICD-10-CM

## 2013-03-20 DIAGNOSIS — T458X1A Poisoning by other primarily systemic and hematological agents, accidental (unintentional), initial encounter: Secondary | ICD-10-CM

## 2013-03-20 DIAGNOSIS — J449 Chronic obstructive pulmonary disease, unspecified: Secondary | ICD-10-CM

## 2013-03-20 DIAGNOSIS — T45515A Adverse effect of anticoagulants, initial encounter: Secondary | ICD-10-CM

## 2013-03-20 DIAGNOSIS — I5033 Acute on chronic diastolic (congestive) heart failure: Secondary | ICD-10-CM

## 2013-03-20 DIAGNOSIS — I4891 Unspecified atrial fibrillation: Secondary | ICD-10-CM

## 2013-03-20 DIAGNOSIS — I1310 Hypertensive heart and chronic kidney disease without heart failure, with stage 1 through stage 4 chronic kidney disease, or unspecified chronic kidney disease: Secondary | ICD-10-CM

## 2013-03-20 DIAGNOSIS — T45511A Poisoning by anticoagulants, accidental (unintentional), initial encounter: Secondary | ICD-10-CM

## 2013-03-20 DIAGNOSIS — J90 Pleural effusion, not elsewhere classified: Secondary | ICD-10-CM

## 2013-03-20 DIAGNOSIS — N189 Chronic kidney disease, unspecified: Secondary | ICD-10-CM

## 2013-03-20 DIAGNOSIS — I509 Heart failure, unspecified: Secondary | ICD-10-CM

## 2013-03-20 DIAGNOSIS — R0902 Hypoxemia: Secondary | ICD-10-CM

## 2013-03-20 DIAGNOSIS — R5381 Other malaise: Secondary | ICD-10-CM

## 2013-03-20 DIAGNOSIS — N19 Unspecified kidney failure: Secondary | ICD-10-CM

## 2013-03-20 DIAGNOSIS — R5383 Other fatigue: Secondary | ICD-10-CM

## 2013-03-20 LAB — URINALYSIS, ROUTINE W REFLEX MICROSCOPIC
Bilirubin Urine: NEGATIVE
Glucose, UA: NEGATIVE mg/dL
Hgb urine dipstick: NEGATIVE
Nitrite: NEGATIVE
Specific Gravity, Urine: 1.013 (ref 1.005–1.030)
pH: 5.5 (ref 5.0–8.0)

## 2013-03-20 LAB — URINE MICROSCOPIC-ADD ON

## 2013-03-20 LAB — PROTIME-INR: Prothrombin Time: 38.6 seconds — ABNORMAL HIGH (ref 11.6–15.2)

## 2013-03-20 MED ORDER — FUROSEMIDE 10 MG/ML IJ SOLN
80.0000 mg | Freq: Two times a day (BID) | INTRAMUSCULAR | Status: AC
  Administered 2013-03-20 – 2013-03-21 (×3): 80 mg via INTRAVENOUS
  Filled 2013-03-20 (×3): qty 8

## 2013-03-20 MED ORDER — ONDANSETRON HCL 4 MG/2ML IJ SOLN
4.0000 mg | Freq: Four times a day (QID) | INTRAMUSCULAR | Status: DC | PRN
  Administered 2013-03-20: 4 mg via INTRAVENOUS
  Filled 2013-03-20: qty 2

## 2013-03-20 MED ORDER — METOLAZONE 2.5 MG PO TABS
2.5000 mg | ORAL_TABLET | Freq: Once | ORAL | Status: AC
  Administered 2013-03-20: 2.5 mg via ORAL
  Filled 2013-03-20: qty 1

## 2013-03-20 MED ORDER — METOCLOPRAMIDE HCL 5 MG/ML IJ SOLN
10.0000 mg | Freq: Three times a day (TID) | INTRAMUSCULAR | Status: DC | PRN
  Administered 2013-03-21: 10 mg via INTRAVENOUS
  Filled 2013-03-20: qty 2

## 2013-03-20 MED ORDER — FUROSEMIDE 10 MG/ML IJ SOLN
80.0000 mg | Freq: Once | INTRAMUSCULAR | Status: AC
  Administered 2013-03-20: 80 mg via INTRAVENOUS
  Filled 2013-03-20: qty 8

## 2013-03-20 MED ORDER — SODIUM CHLORIDE 0.9 % IV SOLN: 250.0000 mL | INTRAVENOUS | Status: DC | PRN

## 2013-03-20 MED ORDER — ALPRAZOLAM 0.25 MG PO TABS
0.2500 mg | ORAL_TABLET | Freq: Two times a day (BID) | ORAL | Status: DC | PRN
  Administered 2013-03-20: 0.25 mg via ORAL
  Filled 2013-03-20 (×2): qty 1

## 2013-03-20 MED ORDER — WARFARIN - PHARMACIST DOSING INPATIENT: Freq: Every day | Status: DC

## 2013-03-20 MED ORDER — SODIUM CHLORIDE 0.9 % IJ SOLN: 3.0000 mL | INTRAMUSCULAR | Status: DC | PRN

## 2013-03-20 MED ORDER — OXYCODONE HCL 5 MG PO TABS
5.0000 mg | ORAL_TABLET | ORAL | Status: DC | PRN
  Administered 2013-03-20 – 2013-03-30 (×33): 5 mg via ORAL
  Filled 2013-03-20 (×33): qty 1

## 2013-03-20 MED ORDER — SODIUM CHLORIDE 0.9 % IJ SOLN
3.0000 mL | Freq: Two times a day (BID) | INTRAMUSCULAR | Status: DC
  Administered 2013-03-20 – 2013-03-30 (×20): 3 mL via INTRAVENOUS

## 2013-03-20 MED ORDER — ACETAMINOPHEN 325 MG PO TABS
650.0000 mg | ORAL_TABLET | ORAL | Status: DC | PRN
  Administered 2013-03-20 – 2013-03-30 (×12): 650 mg via ORAL
  Filled 2013-03-20 (×15): qty 2

## 2013-03-20 NOTE — ED Provider Notes (Signed)
Medical screening examination/treatment/procedure(s) were conducted as a shared visit with non-physician practitioner(s) and myself.  I personally evaluated the patient during the encounter  Increasing abdominal girth, leg swelling, SOB x 3 days.  Recent admit for CHF. On home O2.  No distress. Decreased BS at bases.  Edema to mid thighs bilaterally.   Glynn Octave, MD 03/20/13 1128

## 2013-03-20 NOTE — Progress Notes (Signed)
Admitted last night for CHF exacerbation The patient was discharged to [facility] nursing facility. The patient has been using BiPAP in the evenings, but has not been able to use her machine for the last 2 nights because of some mechanical malfunction. The patient endorses compliance with her medications. Unfortunately, she states that over the past week she has noted worsening abdominal girth to the point that she cannot fit into her pants. In addition she has noted some increase in her bilateral lower extremity edema, left greater than right Work up negative INR supratherapeutic  Continue diuresis  Monitor cr ,baseline around 2.5  CHF team will be notified

## 2013-03-20 NOTE — H&P (Signed)
[facility] History and Physical  Rayola Everhart WUJ:[id number] DOB: May 28, 1927 DOA: 03/19/2013  Referring physician: EDP PCP: Kimber Relic, MD  Specialists:   Chief Complaint: SOB and Increased Swelling of Both Legs  HPI: Gina Ashley is a 79 y.o. female resident of the [facility] who returns to the ED with complaints of worsening SOB and increasing edema of both of her legs.  She denies having Chest pain or fevers or chills.  She denies having a cough or chest congestion or wheezes.   She was discharged on    after a hospitalization for the same.   She was evaluated in the ED and referred for admission for an acute exascerbation of her Chronic CHF.      Review of Systems: The patient denies anorexia, fever, weight loss, vision loss, decreased hearing, hoarseness, chest pain, syncope, balance deficits, hemoptysis, abdominal pain, melena, hematochezia, severe indigestion/heartburn, hematuria, incontinence,  muscle weakness, suspicious skin lesions, transient blindness, difficulty walking, depression, unusual weight change, abnormal bleeding, enlarged lymph nodes, angioedema, and breast masses.    Past Medical History  Diagnosis Date  . GERD (gastroesophageal reflux disease)   . Hiatal hernia   . HTN (hypertension)     all her life  . CKD (chronic kidney disease), stage III     GFR: 38  . Atrial fibrillation     since 1996  . Depression   . Chronic diastolic heart failure 08/31/2012  . Tibia fracture     BILATERAL  . Renal failure     acute on chronic   . Respiratory failure     acute on chronic hx of  requiring BIPAP  . COPD (chronic obstructive pulmonary disease)   . Metabolic encephalopathy     hx of   . Renal failure     acute on chronic with need for intermittent dialysis - hx of   . UTI (urinary tract infection)     hx of   . Pleural effusion     right sided now resolved   . CHF (congestive heart failure)     diastolic HF  . Complication of anesthesia    "hard to wake up" (03/11/2013)  . OSA on CPAP   . Type II diabetes mellitus 1980's?  . History of blood transfusion 08/2012    "related to leg OR" (03/11/2013)  . Arthritis     "left arm; lower back, hips, hands" (03/11/2013)  . Chronic lower back pain     "disc pops in and out" (03/11/2013)  . Gout   . Anxiety   . Fall 08/2012    "in nursing home" (03/11/2013)  . Anemia   . Chronic a-fib 03/12/2013   Past Surgical History  Procedure Laterality Date  . Bunionectomy Bilateral     bilateral  . Cholecystectomy    . Shoulder arthroscopy w/ rotator cuff repair Left 2002  . Total knee arthroplasty Bilateral 2001  . Hammer toe surgery Bilateral 2004  . Colonoscopy  07/11/2011    Procedure: COLONOSCOPY;  Surgeon: Malissa Hippo, MD;  Location: [facility];  Service: Endoscopy;  Laterality: N/A;  3:00  . Orif periprosthetic fracture  09/08/2012    Procedure: OPEN REDUCTION INTERNAL FIXATION (ORIF) PERIPROSTHETIC FRACTURE;  Surgeon: Shelda Pal, MD;  Location: [facility] ORS;  Service: Orthopedics;  Laterality: Right;  ORIF periprosthetic right proximal femur fracture   . External fixation leg  09/08/2012    Procedure: EXTERNAL FIXATION LEG;  Surgeon: Shelda Pal, MD;  Location:  [facility] ORS;  Service: Orthopedics;  Laterality: Left;  . Joint replacement    . External fixation leg  11/30/2012    Procedure: EXTERNAL FIXATION LEG;  Surgeon: Shelda Pal, MD;  Location: [facility] ORS;  Service: Orthopedics;  Laterality: Left;  Removal of External Fixation Left Knee with Evaluation with Floroscopy  . Tee without cardioversion N/A 01/19/2013    Procedure: TRANSESOPHAGEAL ECHOCARDIOGRAM (TEE);  Surgeon: Lewayne Bunting, MD;  Location: [facility] ENDOSCOPY;  Service: Cardiovascular;  Laterality: N/A;  . Shoulder open rotator cuff repair Left 2002    "maybe 3 months after scope" (03/11/2013)  . Femur fracture surgery Right 08/2012    "fell at nursing home" (03/11/2013)  . Tibia fracture surgery Left 08/2012    "fell at nursing  home" (03/11/2013)     Medications:  HOME MEDS: Prior to Admission medications   Medication Sig Start Date End Date Taking? Authorizing Provider  acetaminophen (TYLENOL) 325 MG tablet Take 650 mg by mouth every 6 (six) hours as needed for pain.   Yes Historical Provider, MD  albuterol (PROVENTIL) (2.5 MG/3ML) 0.083% nebulizer solution Take 3 mLs (2.5 mg total) by nebulization every 2 (two) hours as needed for wheezing or shortness of breath. 02/12/13  Yes Shanker Levora Dredge, MD  allopurinol (ZYLOPRIM) 100 MG tablet Take 100 mg by mouth daily.   Yes Historical Provider, MD  ALPRAZolam (XANAX) 0.25 MG tablet Take 0.25 mg by mouth every 8 (eight) hours as needed for anxiety.   Yes Historical Provider, MD  amiodarone (PACERONE) 200 MG tablet Take 400 mg by mouth daily.   Yes Historical Provider, MD  busPIRone (BUSPAR) 5 MG tablet Take 5 mg by mouth every 12 (twelve) hours.   Yes Historical Provider, MD  carvedilol (COREG) 25 MG tablet Take 25 mg by mouth 2 (two) times daily with a meal.   Yes Historical Provider, MD  diphenhydrAMINE (BENADRYL) 25 mg capsule Take 25 mg by mouth every 8 (eight) hours as needed for itching.   Yes Historical Provider, MD  docusate sodium (COLACE) 100 MG capsule Take 100 mg by mouth every 8 (eight) hours.    Yes Historical Provider, MD  furosemide (LASIX) 40 MG tablet Take 40 mg by mouth daily.   Yes Historical Provider, MD  guaiFENesin (MUCINEX) 600 MG 12 hr tablet Take 600 mg by mouth 2 (two) times daily.    Yes Historical Provider, MD  insulin aspart (NOVOLOG) 100 UNIT/ML injection Inject 3 Units into the skin 3 (three) times daily with meals. If cbg is greater than 150 09/15/12  Yes Lesle Chris Black, NP  insulin glargine (LANTUS) 100 UNIT/ML injection Inject 5 Units into the skin at bedtime.   Yes Historical Provider, MD  isosorbide-hydrALAZINE (BIDIL) 20-37.5 MG per tablet Take 1 tablet by mouth 2 (two) times daily. 01/22/13  Yes Erick Blinks, MD  Multiple Vitamin  (MULTIVITAMIN WITH MINERALS) TABS Take 1 tablet by mouth daily.   Yes Historical Provider, MD  nitroGLYCERIN (NITROSTAT) 0.4 MG SL tablet Place 0.4 mg under the tongue every 5 (five) minutes as needed for chest pain.   Yes Historical Provider, MD  omeprazole (PRILOSEC) 20 MG capsule Take 40 mg by mouth daily.   Yes Historical Provider, MD  ondansetron (ZOFRAN) 4 MG tablet Take 1 tablet (4 mg total) by mouth every 6 (six) hours as needed for nausea. 02/12/13  Yes Shanker Levora Dredge, MD  oxyCODONE (OXY IR/ROXICODONE) 5 MG immediate release tablet Take 5 mg by mouth every 4 (four)  hours as needed for pain (breakthrough).    Yes Historical Provider, MD  OxyCODONE (OXYCONTIN) 10 mg T12A Take 1 tablet (10 mg total) by mouth every 12 (twelve) hours. 03/17/13  Yes Kimber Relic, MD  polyethylene glycol [facility] Ethelene Hal) packet Take 17 g by mouth 2 (two) times daily.    Yes Historical Provider, MD  saccharomyces boulardii (FLORASTOR) 250 MG capsule Take 1 capsule (250 mg total) by mouth 2 (two) times daily. 02/12/13  Yes Shanker Levora Dredge, MD  simethicone (MYLICON) 80 MG chewable tablet Chew 80 mg by mouth every 6 (six) hours as needed for flatulence.   Yes Historical Provider, MD  traZODone (DESYREL) 50 MG tablet Take 50 mg by mouth at bedtime.   Yes Historical Provider, MD  warfarin (COUMADIN) 3 MG tablet Take 3 mg by mouth daily.   Yes Historical Provider, MD    Allergies:  Allergies  Allergen Reactions  . Morphine Sulfate Other (See Comments)    REACTION: change in personality  . Remeron (Mirtazapine) Other (See Comments)    Altered mental status, lethargy  . Tuna (Fish Allergy) Other (See Comments)    unknown  . Penicillins Rash    Tolerates Ancef.    Social History:   reports that she has never smoked. She has never used smokeless tobacco. She reports that she does not drink alcohol or use illicit drugs.  Family History: Family History  Problem Relation Age of Onset  . Colon cancer  Brother      Physical Exam:  GEN:  Pleasant   Elderly 79 year old Obese African American Female examined  and in no acute distress; cooperative with exam Filed Vitals:   03/19/13 2330 03/20/13 0243 03/20/13 0255 03/20/13 0559  BP: 104/64  117/77 123/68  Pulse: 75  79 82  Temp:   97.2 F (36.2 C) 97.2 F (36.2 C)  TempSrc:   Oral Oral  Resp: 23  20 20   Height:  5\' 3"  (1.6 m)    Weight:  100.6 kg (221 lb 12.5 oz)    SpO2: 97%  96% 93%   Blood pressure 123/68, pulse 82, temperature 97.2 F (36.2 C), temperature source Oral, resp. rate 20, height 5\' 3"  (1.6 m), weight 100.6 kg (221 lb 12.5 oz), SpO2 93.00%. PSYCH: She is alert and oriented x4; does not appear anxious does not appear depressed; affect is normal HEENT: Normocephalic and Atraumatic, Mucous membranes pink; PERRLA; EOM intact; Fundi:  Benign;  No scleral icterus, Nares: Patent, Oropharynx: Clear,  Fair Dentition, Neck:  FROM, no cervical lymphadenopathy nor thyromegaly or carotid bruit; no JVD; Breasts:: Not examined CHEST WALL: No tenderness CHEST: Normal respiration, clear to auscultation bilaterally HEART: Regular rate and rhythm; no murmurs rubs or gallops BACK: No kyphosis or scoliosis; no CVA tenderness ABDOMEN: Positive Bowel Sounds, Obese, soft non-tender; no masses, no organomegaly, no pannus; no intertriginous candida. Rectal Exam: Not done EXTREMITIES: No cyanosis, clubbing , 2+ EDEMA BLEs;  no ulcerations. Genitalia: not examined PULSES: 2+ and symmetric SKIN: Normal hydration no rash or ulceration CNS: Cranial nerves 2-12 grossly intact no focal neurologic deficit   Labs & Imaging Results for orders placed during the hospital encounter of 03/19/13 (from the past 48 hour(s))  PRO B NATRIURETIC PEPTIDE     Status: Abnormal   Collection Time    03/19/13  9:23 PM      Result Value Range   Pro B Natriuretic peptide (BNP) 4611.0 (*) 0 - 450 pg/mL  CBC  WITH DIFFERENTIAL     Status: Abnormal   Collection Time     03/19/13  9:23 PM      Result Value Range   WBC 5.9  4.0 - 10.5 K/uL   RBC 3.52 (*) 3.87 - 5.11 MIL/uL   Hemoglobin 9.6 (*) 12.0 - 15.0 g/dL   HCT 13.2 (*) 44.0 - 10.2 %   MCV 86.6  78.0 - 100.0 fL   MCH 27.3  26.0 - 34.0 pg   MCHC 31.5  30.0 - 36.0 g/dL   RDW 72.5 (*) 36.6 - 44.0 %   Platelets 104 (*) 150 - 400 K/uL   Comment: CONSISTENT WITH PREVIOUS RESULT   Neutrophils Relative % 77  43 - 77 %   Neutro Abs 4.5  1.7 - 7.7 K/uL   Lymphocytes Relative 16  12 - 46 %   Lymphs Abs 0.9  0.7 - 4.0 K/uL   Monocytes Relative 4  3 - 12 %   Monocytes Absolute 0.3  0.1 - 1.0 K/uL   Eosinophils Relative 2  0 - 5 %   Eosinophils Absolute 0.1  0.0 - 0.7 K/uL   Basophils Relative 0  0 - 1 %   Basophils Absolute 0.0  0.0 - 0.1 K/uL  COMPREHENSIVE METABOLIC PANEL     Status: Abnormal   Collection Time    03/19/13  9:23 PM      Result Value Range   Sodium 131 (*) 135 - 145 mEq/L   Potassium 4.7  3.5 - 5.1 mEq/L   Chloride 89 (*) 96 - 112 mEq/L   CO2 34 (*) 19 - 32 mEq/L   Glucose, Bld 202 (*) 70 - 99 mg/dL   BUN 68 (*) 6 - 23 mg/dL   Creatinine, Ser 3.47 (*) 0.50 - 1.10 mg/dL   Calcium 9.7  8.4 - 42.5 mg/dL   Total Protein 7.7  6.0 - 8.3 g/dL   Albumin 4.1  3.5 - 5.2 g/dL   AST 18  0 - 37 U/L   ALT 10  0 - 35 U/L   Alkaline Phosphatase 109  39 - 117 U/L   Total Bilirubin 0.6  0.3 - 1.2 mg/dL   GFR calc non Af Amer 14 (*) >90 mL/min   GFR calc Af Amer 17 (*) >90 mL/min   Comment:            The eGFR has been calculated     using the CKD EPI equation.     This calculation has not been     validated in all clinical     situations.     eGFR's persistently     <90 mL/min signify     possible Chronic Kidney Disease.  LIPASE, BLOOD     Status: None   Collection Time    03/19/13  9:23 PM      Result Value Range   Lipase 20  11 - 59 U/L  POCT I-STAT TROPONIN I     Status: None   Collection Time    03/19/13  9:40 PM      Result Value Range   Troponin i, poc 0.01  0.00 - 0.08  ng/mL   Comment 3            Comment: Due to the release kinetics of cTnI,     a negative result within the first hours     of the onset of symptoms does not rule out     myocardial  infarction with certainty.     If myocardial infarction is still suspected,     repeat the test at appropriate intervals.  URINALYSIS, ROUTINE W REFLEX MICROSCOPIC     Status: Abnormal   Collection Time    03/20/13  1:33 AM      Result Value Range   Color, Urine YELLOW  YELLOW   APPearance CLOUDY (*) CLEAR   Specific Gravity, Urine 1.013  1.005 - 1.030   pH 5.5  5.0 - 8.0   Glucose, UA NEGATIVE  NEGATIVE mg/dL   Hgb urine dipstick NEGATIVE  NEGATIVE   Bilirubin Urine NEGATIVE  NEGATIVE   Ketones, ur NEGATIVE  NEGATIVE mg/dL   Protein, ur NEGATIVE  NEGATIVE mg/dL   Urobilinogen, UA 0.2  0.0 - 1.0 mg/dL   Nitrite NEGATIVE  NEGATIVE   Leukocytes, UA MODERATE (*) NEGATIVE  URINE MICROSCOPIC-ADD ON     Status: Abnormal   Collection Time    03/20/13  1:33 AM      Result Value Range   Squamous Epithelial / LPF RARE  RARE   WBC, UA 3-6  <3 WBC/hpf   Bacteria, UA MANY (*) RARE  PROTIME-INR     Status: Abnormal   Collection Time    03/20/13  5:30 AM      Result Value Range   Prothrombin Time 38.6 (*) 11.6 - 15.2 seconds   INR 4.30 (*) 0.00 - 1.49    Radiological Exams on Admission: Dg Chest 2 View  03/19/2013   *RADIOLOGY REPORT*  Clinical Data: Chest and abdominal pain  CHEST - 2 VIEW  Comparison: 03/16/2013  Findings: Moderate cardiac enlargement is again identified. Airspace consolidation within the superior segment of right lower lobe and right middle lobe is again noted and does not appear significantly changed from previous exam.  The left lung is clear. No pleural effusion is identified.  IMPRESSION:  1.  Persistent consolidation involving the right middle lobe and superior segment of right lower lobe. 2.  Cardiac enlargement.   Original Report Authenticated By: Signa Kell, M.D.    EKG:  Independently reviewed.  Atrial fibrillation rate 97.    Assessment/Plan Principal Problem:   Acute on chronic diastolic heart failure Active Problems:   COPD (chronic obstructive pulmonary disease)   Shortness of breath   DM   HYPERTENSION   CKD (chronic kidney disease)   Chronic a-fib   Warfarin-induced coagulopathy   1.   Acute on ChronicDiastolic CHF- CHF protocol and Diurese with IV lasix given 1 dose of Metalozone in Ed with the IV lasix 80mg , Monitor her electrolytes BUN/Cr and I/Os.     2.   SOB due to #1   3.  COPD -stable.    4.   DM2- continue and add SSI coverage PRN.    5.   HTN- Monitor BPs, and continue carvedilol therapy and lasix Rx.    6.   CKD- Monitor Bun/Cr, baseline CR= 2.4.    7.   Chronic Atrial Fibrillation-  Contiune Coumadin  Monitor Pt/INRs adjust PRN.    8.  Warfarin Induced Coaguloapthy-Monitor for changes outside of therapeutic goal.     Code Status:  FULL CODE Family Communication:  Daughter at Bedside Disposition Plan:  Reutrn to SNF [facility])  Time spent: 56 Minutes  Ron Parker [facility] Pager [phone]  If 7PM-7AM, please contact night-coverage [email or web address] Password [facility] 03/20/2013, 9:34 AM

## 2013-03-20 NOTE — ED Notes (Signed)
Attempted report 

## 2013-03-20 NOTE — Progress Notes (Signed)
Patient transferred from ED via stretcher to room 4705 with daughter at bedside. Patient oriented to room and call bell system. Patient educated and encouraged to watch Heart failure video but states would like to watch it later for she would like to get some sleep. Will continue to monitor to end of shift.

## 2013-03-20 NOTE — ED Notes (Signed)
Dr. Jenkins at bedside. 

## 2013-03-20 NOTE — Progress Notes (Addendum)
ANTICOAGULATION CONSULT NOTE - Initial Consult  Pharmacy Consult for Coumadin Indication: atrial fibrillation  Allergies  Allergen Reactions  . Morphine Sulfate Other (See Comments)    REACTION: change in personality  . Remeron (Mirtazapine) Other (See Comments)    Altered mental status, lethargy  . Tuna (Fish Allergy) Other (See Comments)    unknown  . Penicillins Rash    Tolerates Ancef.    Patient Measurements:   Vital Signs: Temp: 97.9 F (36.6 C) (05/16 2109) Temp src: Oral (05/16 2109) BP: 104/64 mmHg (05/16 2330) Pulse Rate: 75 (05/16 2330)  Labs:  Recent Labs  03/17/13 0525 03/19/13 2123  HGB  --  9.6*  HCT  --  30.5*  PLT  --  104*  LABPROT 28.3*  --   INR 2.83*  --   CREATININE 2.63* 2.84*    The CrCl is unknown because both a height and weight (above a minimum accepted value) are required for this calculation.   Medical History: Past Medical History  Diagnosis Date  . GERD (gastroesophageal reflux disease)   . Hiatal hernia   . HTN (hypertension)     all her life  . CKD (chronic kidney disease), stage III     GFR: 38  . Atrial fibrillation     since 1996  . Depression   . Chronic diastolic heart failure 08/31/2012  . Tibia fracture     BILATERAL  . Renal failure     acute on chronic   . Respiratory failure     acute on chronic hx of  requiring BIPAP  . COPD (chronic obstructive pulmonary disease)   . Metabolic encephalopathy     hx of   . Renal failure     acute on chronic with need for intermittent dialysis - hx of   . UTI (urinary tract infection)     hx of   . Pleural effusion     right sided now resolved   . CHF (congestive heart failure)     diastolic HF  . Complication of anesthesia     "hard to wake up" (03/11/2013)  . OSA on CPAP   . Type II diabetes mellitus 1980's?  . History of blood transfusion 08/2012    "related to leg OR" (03/11/2013)  . Arthritis     "left arm; lower back, hips, hands" (03/11/2013)  . Chronic lower  back pain     "disc pops in and out" (03/11/2013)  . Gout   . Anxiety   . Fall 08/2012    "in nursing home" (03/11/2013)  . Anemia   . Chronic a-fib 03/12/2013    Medications:  APAP  Albuterol  Allopurinol  Xanax  Amiodarone  Buspar  Coreg  Benadryl  Colace  Lasix  Mucinex  Novolog  Lantus  Bidil  MVI  Ntg  Prilosec  Oxycontin  Miralax  Florastor  Trazodone  Coumadin 3 mg daily  Assessment: 78 yo female admitted with abdominal pain, h/o AFib, to continue Coumadin  Goal of Therapy:  INR 2-3 Monitor platelets by anticoagulation protocol: Yes   Plan:  Daily INR  Abbott, Gary Fleet 03/20/2013,1:53 AM  Addendum  Addendum:  Coumadin 3 mg daily PTA. INR up today to 4.30 from 2.83 on 5/14 (last admission). Last dose of coumadin was on 5/15 per med rec. Hgb stable at 9.6, plts low but stable at 104. Plan - No Coumadin tonight  - Continue to monitor daily INR - Monitor for bleeding   Thank you,  Franchot Erichsen, Pharm.D. Clinical Pharmacist   Pager: [phone] 03/20/2013 7:26 AM

## 2013-03-21 ENCOUNTER — Inpatient Hospital Stay (HOSPITAL_COMMUNITY): Payer: Medicare Other

## 2013-03-21 DIAGNOSIS — I509 Heart failure, unspecified: Secondary | ICD-10-CM

## 2013-03-21 LAB — BASIC METABOLIC PANEL
BUN: 68 mg/dL — ABNORMAL HIGH (ref 6–23)
CO2: 39 mEq/L — ABNORMAL HIGH (ref 19–32)
Calcium: 9.6 mg/dL (ref 8.4–10.5)
Creatinine, Ser: 2.75 mg/dL — ABNORMAL HIGH (ref 0.50–1.10)
Glucose, Bld: 144 mg/dL — ABNORMAL HIGH (ref 70–99)

## 2013-03-21 MED ORDER — POLYETHYLENE GLYCOL 3350 17 G PO PACK: 17.0000 g | PACK | Freq: Every day | ORAL | Status: DC

## 2013-03-21 MED ORDER — AMIODARONE HCL 200 MG PO TABS
400.0000 mg | ORAL_TABLET | Freq: Every day | ORAL | Status: DC
  Administered 2013-03-21 – 2013-03-30 (×10): 400 mg via ORAL
  Filled 2013-03-21 (×10): qty 2

## 2013-03-21 MED ORDER — ALPRAZOLAM 0.25 MG PO TABS
0.2500 mg | ORAL_TABLET | Freq: Three times a day (TID) | ORAL | Status: DC | PRN
  Administered 2013-03-21 – 2013-03-30 (×12): 0.25 mg via ORAL
  Filled 2013-03-21 (×12): qty 1

## 2013-03-21 MED ORDER — LORAZEPAM 2 MG/ML IJ SOLN
1.0000 mg | Freq: Once | INTRAMUSCULAR | Status: AC
  Administered 2013-03-21: 1 mg via INTRAVENOUS
  Filled 2013-03-21: qty 1

## 2013-03-21 MED ORDER — SIMETHICONE 80 MG PO CHEW
80.0000 mg | CHEWABLE_TABLET | Freq: Four times a day (QID) | ORAL | Status: DC | PRN
  Administered 2013-03-21: 80 mg via ORAL
  Filled 2013-03-21 (×2): qty 1

## 2013-03-21 MED ORDER — CARVEDILOL 25 MG PO TABS
25.0000 mg | ORAL_TABLET | Freq: Two times a day (BID) | ORAL | Status: DC
  Administered 2013-03-21 – 2013-03-30 (×18): 25 mg via ORAL
  Filled 2013-03-21 (×21): qty 1

## 2013-03-21 MED ORDER — POLYETHYLENE GLYCOL 3350 17 G PO PACK
17.0000 g | PACK | Freq: Two times a day (BID) | ORAL | Status: DC
  Administered 2013-03-21 – 2013-03-30 (×14): 17 g via ORAL
  Filled 2013-03-21 (×20): qty 1

## 2013-03-21 MED ORDER — BISACODYL 10 MG RE SUPP
10.0000 mg | Freq: Once | RECTAL | Status: AC
  Administered 2013-03-21: 10 mg via RECTAL
  Filled 2013-03-21: qty 1

## 2013-03-21 MED ORDER — OXYCODONE HCL ER 10 MG PO T12A
10.0000 mg | EXTENDED_RELEASE_TABLET | Freq: Two times a day (BID) | ORAL | Status: DC
  Administered 2013-03-21 – 2013-03-30 (×19): 10 mg via ORAL
  Filled 2013-03-21 (×19): qty 1

## 2013-03-21 MED ORDER — ISOSORB DINITRATE-HYDRALAZINE 20-37.5 MG PO TABS
1.0000 | ORAL_TABLET | Freq: Two times a day (BID) | ORAL | Status: DC
  Administered 2013-03-21 – 2013-03-28 (×16): 1 via ORAL
  Filled 2013-03-21 (×18): qty 1

## 2013-03-21 MED ORDER — ALBUTEROL SULFATE (5 MG/ML) 0.5% IN NEBU
2.5000 mg | INHALATION_SOLUTION | Freq: Four times a day (QID) | RESPIRATORY_TRACT | Status: DC
  Administered 2013-03-21 – 2013-03-22 (×4): 2.5 mg via RESPIRATORY_TRACT
  Filled 2013-03-21 (×4): qty 0.5

## 2013-03-21 MED ORDER — PANTOPRAZOLE SODIUM 40 MG PO TBEC
40.0000 mg | DELAYED_RELEASE_TABLET | Freq: Every day | ORAL | Status: DC
  Administered 2013-03-21 – 2013-03-30 (×10): 40 mg via ORAL
  Filled 2013-03-21 (×10): qty 1

## 2013-03-21 MED ORDER — BUSPIRONE HCL 5 MG PO TABS
5.0000 mg | ORAL_TABLET | Freq: Two times a day (BID) | ORAL | Status: DC
  Administered 2013-03-21 – 2013-03-30 (×19): 5 mg via ORAL
  Filled 2013-03-21 (×21): qty 1

## 2013-03-21 MED ORDER — GUAIFENESIN ER 600 MG PO TB12
600.0000 mg | ORAL_TABLET | Freq: Two times a day (BID) | ORAL | Status: DC
  Administered 2013-03-21 – 2013-03-30 (×19): 600 mg via ORAL
  Filled 2013-03-21 (×20): qty 1

## 2013-03-21 MED ORDER — NITROGLYCERIN 0.4 MG SL SUBL: 0.4000 mg | SUBLINGUAL_TABLET | SUBLINGUAL | Status: DC | PRN

## 2013-03-21 MED ORDER — ALLOPURINOL 100 MG PO TABS
100.0000 mg | ORAL_TABLET | Freq: Every day | ORAL | Status: DC
  Administered 2013-03-21 – 2013-03-30 (×10): 100 mg via ORAL
  Filled 2013-03-21 (×10): qty 1

## 2013-03-21 NOTE — Progress Notes (Signed)
ANTICOAGULATION CONSULT NOTE - Initial Consult  Pharmacy Consult for Coumadin Indication: atrial fibrillation  Allergies  Allergen Reactions  . Morphine Sulfate Other (See Comments)    REACTION: change in personality  . Remeron (Mirtazapine) Other (See Comments)    Altered mental status, lethargy  . Tuna (Fish Allergy) Other (See Comments)    unknown  . Penicillins Rash    Tolerates Ancef.    Patient Measurements: Height: 5\' 3"  (160 cm) Weight: 220 lb 3.8 oz (99.9 kg) IBW/kg (Calculated) : 52.4 Vital Signs: Temp: 97.8 F (36.6 C) (05/18 0400) Temp src: Oral (05/18 0400) BP: 142/85 mmHg (05/18 0553) Pulse Rate: 81 (05/18 0553)  Labs:  Recent Labs  03/19/13 2123 03/20/13 0530 03/21/13 0500  HGB 9.6*  --   --   HCT 30.5*  --   --   PLT 104*  --   --   LABPROT  --  38.6* 36.0*  INR  --  4.30* 3.91*  CREATININE 2.84*  --  2.75*    Estimated Creatinine Clearance: 17.5 ml/min (by C-G formula based on Cr of 2.75).   Medical History: Past Medical History  Diagnosis Date  . GERD (gastroesophageal reflux disease)   . Hiatal hernia   . HTN (hypertension)     all her life  . CKD (chronic kidney disease), stage III     GFR: 38  . Atrial fibrillation     since 1996  . Depression   . Chronic diastolic heart failure 08/31/2012  . Tibia fracture     BILATERAL  . Renal failure     acute on chronic   . Respiratory failure     acute on chronic hx of  requiring BIPAP  . COPD (chronic obstructive pulmonary disease)   . Metabolic encephalopathy     hx of   . Renal failure     acute on chronic with need for intermittent dialysis - hx of   . UTI (urinary tract infection)     hx of   . Pleural effusion     right sided now resolved   . CHF (congestive heart failure)     diastolic HF  . Complication of anesthesia     "hard to wake up" (03/11/2013)  . OSA on CPAP   . Type II diabetes mellitus 1980's?  . History of blood transfusion 08/2012    "related to leg OR"  (03/11/2013)  . Arthritis     "left arm; lower back, hips, hands" (03/11/2013)  . Chronic lower back pain     "disc pops in and out" (03/11/2013)  . Gout   . Anxiety   . Fall 08/2012    "in nursing home" (03/11/2013)  . Anemia   . Chronic a-fib 03/12/2013    Medications:  APAP  Albuterol  Allopurinol  Xanax  Amiodarone  Buspar  Coreg  Benadryl  Colace  Lasix  Mucinex  Novolog  Lantus  Bidil  MVI  Ntg  Prilosec  Oxycontin  Miralax  Florastor  Trazodone  Coumadin 3 mg daily  Assessment: 79 yo female admitted with abdominal pain, SOB/LE edema and h/o AFib. Pharmacy consulted to manage Coumadin. Coumadin 3 mg daily PTA. INR up to 4.30 on 5/17 from 2.83 on 5/14 (last admission). INR today is trending down to 3.91. Last dose of coumadin was on 5/15 per med rec. Hgb stable at 9.6 (5/16), plts low but stable at 104 (5/16). No report of bleeding   Goal of Therapy:  INR 2-3  Monitor platelets by anticoagulation protocol: Yes   Plan:  - No Coumadin tonight  - Continue to monitor daily INR - Monitor for bleeding   Thank you, Franchot Erichsen, Pharm.D. Clinical Pharmacist   Pager: [phone] 03/21/2013 7:41 AM

## 2013-03-21 NOTE — Progress Notes (Signed)
[facility] PROGRESS NOTE  Sharena Dibenedetto ZOX:[id number] DOB: [birth year] DOA: 03/19/2013 PCP: Kimber Relic, MD  Assessment/Plan: Principal Problem:   Acute on chronic diastolic heart failure Active Problems:   DM   HYPERTENSION   COPD (chronic obstructive pulmonary disease)   CKD (chronic kidney disease)   Shortness of breath   Chronic a-fib   Warfarin-induced coagulopathy      1.Acute on ChronicDiastolic CHF- CHF protocol and Diurese with IV lasix given 1 dose of Metalozone in Ed , continue IV lasix 80mg , Monitor her electrolytes BUN/Cr and I/Os.  2. SOB due to #1  3. COPD -stable.  4. DM2- continue and add SSI coverage PRN.  5. HTN- Monitor BPs, and continue carvedilol therapy and lasix Rx.  6. CKD- Monitor Bun/Cr, baseline CR= 2.4.  7. Chronic Atrial Fibrillation- Contiune Coumadin Monitor Pt/INRs adjust PRN.  8. Warfarin Induced Coaguloapthy-INR down from 4.3-3.91    Code Status: DO NOT RESUSCITATE Family Communication: family updated about patient's clinical progress Disposition Plan:  Likely Monday, needs a new SNF   Brief narrative: Gina Ashley is a 79 y.o. female resident of the [facility] who returns to the ED with complaints of worsening SOB and increasing edema of both of her legs. She denies having Chest pain or fevers or chills. She denies having a cough or chest congestion or wheezes. She was discharged on after a hospitalization for the same. She was evaluated in the ED and referred for admission for an acute exascerbation of her Chronic CHF.      Consultants:  Heart failure team  Procedures:  None  Antibiotics:  None  HPI/Subjective: Anxious with minimal shortness of breath Appears to be at baseline  Objective: Filed Vitals:   03/20/13 2019 03/21/13 0245 03/21/13 0400 03/21/13 0553  BP: 148/80 120/59 94/57 142/85  Pulse: 82 79 89 81  Temp: 97.7 F (36.5 C) 98.1 F (36.7 C) 97.8 F (36.6 C)   TempSrc: Oral Oral Oral    Resp: 20 16 18    Height:      Weight:   99.9 kg (220 lb 3.8 oz)   SpO2: 95% 100% 90% 95%    Intake/Output Summary (Last 24 hours) at 03/21/13 0744 Last data filed at 03/21/13 0404  Gross per 24 hour  Intake    700 ml  Output   2495 ml  Net  -1795 ml    Exam:  HENT:  Head: Atraumatic.  Nose: Nose normal.  Mouth/Throat: Oropharynx is clear and moist.  Eyes: Conjunctivae are normal. Pupils are equal, round, and reactive to light. No scleral icterus.  Neck: Neck supple. No tracheal deviation present.  Cardiovascular: Normal rate, regular rhythm, normal heart sounds and intact distal pulses.  Pulmonary/Chest: Effort normal and breath sounds normal. No respiratory distress.  Abdominal: Soft. Normal appearance and bowel sounds are normal. She exhibits no distension. There is no tenderness.  Musculoskeletal: She exhibits no edema and no tenderness.  Neurological: She is alert. No cranial nerve deficit.    Data Reviewed: Basic Metabolic Panel:  Recent Labs Lab 03/15/13 0526 03/16/13 0432 03/17/13 0525 03/19/13 2123 03/21/13 0500  NA 137 134* 135 131* 134*  K 4.3 4.7 4.3 4.7 3.9  CL 92* 90* 90* 89* 90*  CO2 41* 38* 41* 34* 39*  GLUCOSE 163* 140* 118* 202* 144*  BUN 59* 62* 64* 68* 68*  CREATININE 2.46* 2.65* 2.63* 2.84* 2.75*  CALCIUM 9.8 9.7 9.7 9.7 9.6    Liver Function Tests:  Recent Labs  Lab 03/19/13 2123  AST 18  ALT 10  ALKPHOS 109  BILITOT 0.6  PROT 7.7  ALBUMIN 4.1    Recent Labs Lab 03/19/13 2123  LIPASE 20   No results found for this basename: AMMONIA,  in the last 168 hours  CBC:  Recent Labs Lab 03/19/13 2123  WBC 5.9  NEUTROABS 4.5  HGB 9.6*  HCT 30.5*  MCV 86.6  PLT 104*    Cardiac Enzymes: No results found for this basename: CKTOTAL, CKMB, CKMBINDEX, TROPONINI,  in the last 168 hours BNP (last 3 results)  Recent Labs  03/10/13 2117 03/13/13 0747 03/19/13 2123  PROBNP 4061.0* 4669.0* 4611.0*     CBG:  Recent  Labs Lab 03/16/13 0629 03/16/13 1647 03/16/13 2112 03/17/13 0559 03/17/13 1120  GLUCAP 141* 208* 161* 109* 142*    No results found for this or any previous visit (from the past 240 hour(s)).   Studies: Dg Chest 2 View  03/19/2013   *RADIOLOGY REPORT*  Clinical Data: Chest and abdominal pain  CHEST - 2 VIEW  Comparison: 03/16/2013  Findings: Moderate cardiac enlargement is again identified. Airspace consolidation within the superior segment of right lower lobe and right middle lobe is again noted and does not appear significantly changed from previous exam.  The left lung is clear. No pleural effusion is identified.  IMPRESSION:  1.  Persistent consolidation involving the right middle lobe and superior segment of right lower lobe. 2.  Cardiac enlargement.   Original Report Authenticated By: Signa Kell, M.D.   Dg Chest 2 View  03/10/2013   *RADIOLOGY REPORT*  Clinical Data: Weakness and chest pain.  No shortness of breath.  CHEST - 2 VIEW  Comparison: 02/18/2013.  Findings: Cardiac enlargement.  Right base effusion appears increased.  Right basilar atelectasis.  No infiltrate or overt edema.  Degenerative changes thoracic spine.  IMPRESSION: Increased right pleural effusion.   Original Report Authenticated By: Davonna Belling, M.D.   Dg Abd Acute W/chest  03/16/2013   *RADIOLOGY REPORT*  Clinical Data: Shortness breath, abdominal pain, distention.  ACUTE ABDOMEN SERIES (ABDOMEN 2 VIEW & CHEST 1 VIEW)  Comparison: Chest x-ray 03/10/2013.  Abdomen image 10/19/2012.  Findings: There is cardiomegaly with vascular congestion.  Focal right mid and lower lung consolidation with moderate right pleural effusion.  No focal opacity on the left.  Nonobstructive bowel gas pattern.  No free air or organomegaly. Prior cholecystectomy.  Moderate stool burden throughout the colon. No suspicious calcifications.  No acute bony abnormality.  IMPRESSION: Right mid and lower lung consolidation.  Question pneumonia.  Moderate right pleural effusion.  Cardiomegaly, vascular congestion.  Prior cholecystectomy.   Original Report Authenticated By: Charlett Nose, M.D.    Scheduled Meds: . furosemide  80 mg Intravenous Q12H  . sodium chloride  3 mL Intravenous Q12H  . Warfarin - Pharmacist Dosing Inpatient   Does not apply q1800   Continuous Infusions:   Principal Problem:   Acute on chronic diastolic heart failure Active Problems:   DM   HYPERTENSION   COPD (chronic obstructive pulmonary disease)   CKD (chronic kidney disease)   Shortness of breath   Chronic a-fib   Warfarin-induced coagulopathy    Time spent: 40 minutes   [facility]  [facility] Pager [phone]. If 8PM-8AM, please contact night-coverage at [email or web address], password [facility] 03/21/2013, 7:44 AM  LOS: 2 days

## 2013-03-21 NOTE — Progress Notes (Signed)
Utilization Review Completed.   Adabelle Griffiths, RN, BSN Nurse Case Manager  [phone]  

## 2013-03-21 NOTE — Progress Notes (Signed)
Clinical Social Work Department CLINICAL SOCIAL WORK PLACEMENT NOTE 03/21/2013  Patient:  Gina Ashley, Gina Ashley  Account Number:  [id number] Admit date:  03/19/2013  Clinical Social Worker:  Pollyann Savoy, LCSW  Date/time:  03/21/2013 03:51 PM  Clinical Social Work is seeking post-discharge placement for this patient at the following level of care:   SKILLED NURSING   (*CSW will update this form in Epic as items are completed)   03/21/2013  Patient/family provided with [facility] Department of Clinical Social Work's list of facilities offering this level of care within the geographic area requested by the patient (or if unable, by the patient's family).  03/21/2013  Patient/family informed of their freedom to choose among providers that offer the needed level of care, that participate in Medicare, Medicaid or managed care program needed by the patient, have an available bed and are willing to accept the patient.  03/21/2013  Patient/family informed of [facility]' ownership interest in [facility], as well as of the fact that they are under no obligation to receive care at this facility.  PASARR submitted to EDS on  PASARR number received from EDS on   FL2 transmitted to all facilities in geographic area requested by pt/family on  03/21/2013 FL2 transmitted to all facilities within larger geographic area on   Patient informed that his/her managed care company has contracts with or will negotiate with  certain facilities, including the following:     Patient/family informed of bed offers received:   Patient chooses bed at  Physician recommends and patient chooses bed at    Patient to be transferred to  on   Patient to be transferred to facility by   The following physician request were entered in Epic:   Additional Comments:

## 2013-03-21 NOTE — Progress Notes (Signed)
Patient placed on CPAP machine for the night.  Patient tolerating well.  RT will continue to monitor.

## 2013-03-21 NOTE — Progress Notes (Signed)
Clinical Social Work Department BRIEF PSYCHOSOCIAL ASSESSMENT 03/21/2013  Patient:  Gina Ashley     Account Number:  [id number]     Admit date:  03/19/2013  Clinical Social Worker:  Illene Silver  Date/Time:  03/21/2013 03:19 PM  Referred by:  Physician  Date Referred:  03/21/2013 Referred for  SNF Placement   Other Referral:   Interview type:  Family Other interview type:    PSYCHOSOCIAL DATA Living Status:  FACILITY Admitted from facility:  [facility] Level of care:  Skilled Nursing Facility Primary support name:  Gina Ashley Primary support relationship to patient:  CHILD, ADULT Degree of support available:   Good.  Spoke with pt's dtr at the bedside.    CURRENT CONCERNS Current Concerns  Post-Acute Placement   Other Concerns:    SOCIAL WORK ASSESSMENT / PLAN CSW spoke with pt's dtr re: changing NH at d/c.  Pt was being taken downstairs for testing.  Pt's dtr prefers that pt doesn't return to [facility] at d/c, b/c she is unsatisfied with the care she is receiving there.  Dtr prefers [facility].  CSW will initiate new bed search.   Assessment/plan status:  Psychosocial Support/Ongoing Assessment of Needs Other assessment/ plan:   Information/referral to community resources:    PATIENT'S/FAMILY'S RESPONSE TO PLAN OF CARE: Dtr. agreeable to CSW plan.  Looking forward to pt moving out of [facility].

## 2013-03-21 NOTE — Progress Notes (Signed)
PT Cancellation Note  Patient Details Name: Gina Ashley MRN: [id number] DOB: 1927-10-28   Cancelled Treatment:    Reason Eval/Treat Not Completed:  (Pt refusing at this time.)  Will plan to follow pt tomorrow.  Thanks!!   Koichi Platte 03/21/2013, 4:38 PM Jake Shark, PT DPT [phone]

## 2013-03-22 ENCOUNTER — Inpatient Hospital Stay (HOSPITAL_COMMUNITY): Payer: Medicare Other

## 2013-03-22 DIAGNOSIS — J90 Pleural effusion, not elsewhere classified: Secondary | ICD-10-CM

## 2013-03-22 DIAGNOSIS — I1310 Hypertensive heart and chronic kidney disease without heart failure, with stage 1 through stage 4 chronic kidney disease, or unspecified chronic kidney disease: Secondary | ICD-10-CM

## 2013-03-22 DIAGNOSIS — N19 Unspecified kidney failure: Secondary | ICD-10-CM

## 2013-03-22 DIAGNOSIS — R0902 Hypoxemia: Secondary | ICD-10-CM

## 2013-03-22 LAB — CBC
HCT: 27.3 % — ABNORMAL LOW (ref 36.0–46.0)
MCV: 86.9 fL (ref 78.0–100.0)
Platelets: 104 10*3/uL — ABNORMAL LOW (ref 150–400)
RBC: 3.14 MIL/uL — ABNORMAL LOW (ref 3.87–5.11)
WBC: 5 10*3/uL (ref 4.0–10.5)

## 2013-03-22 LAB — BASIC METABOLIC PANEL
BUN: 64 mg/dL — ABNORMAL HIGH (ref 6–23)
Chloride: 88 mEq/L — ABNORMAL LOW (ref 96–112)
Glucose, Bld: 238 mg/dL — ABNORMAL HIGH (ref 70–99)
Potassium: 3.4 mEq/L — ABNORMAL LOW (ref 3.5–5.1)
Sodium: 134 mEq/L — ABNORMAL LOW (ref 135–145)

## 2013-03-22 LAB — PROTIME-INR: INR: 3.06 — ABNORMAL HIGH (ref 0.00–1.49)

## 2013-03-22 MED ORDER — FUROSEMIDE 10 MG/ML IJ SOLN
15.0000 mg/h | INTRAVENOUS | Status: DC
  Administered 2013-03-22 – 2013-03-25 (×4): 15 mg/h via INTRAVENOUS
  Filled 2013-03-22 (×8): qty 25

## 2013-03-22 MED ORDER — MENTHOL 3 MG MT LOZG
1.0000 | LOZENGE | OROMUCOSAL | Status: DC | PRN
  Filled 2013-03-22: qty 9

## 2013-03-22 MED ORDER — LEVALBUTEROL HCL 0.63 MG/3ML IN NEBU
0.6300 mg | INHALATION_SOLUTION | Freq: Four times a day (QID) | RESPIRATORY_TRACT | Status: DC
  Administered 2013-03-22 (×2): 0.63 mg via RESPIRATORY_TRACT
  Filled 2013-03-22 (×2): qty 3

## 2013-03-22 MED ORDER — IPRATROPIUM BROMIDE 0.02 % IN SOLN
0.5000 mg | Freq: Four times a day (QID) | RESPIRATORY_TRACT | Status: DC
  Administered 2013-03-22 (×2): 0.5 mg via RESPIRATORY_TRACT
  Filled 2013-03-22 (×2): qty 2.5

## 2013-03-22 MED ORDER — POTASSIUM CHLORIDE CRYS ER 20 MEQ PO TBCR
40.0000 meq | EXTENDED_RELEASE_TABLET | Freq: Two times a day (BID) | ORAL | Status: AC
  Administered 2013-03-22: 40 meq via ORAL
  Filled 2013-03-22: qty 2

## 2013-03-22 MED ORDER — PHENOL 1.4 % MT LIQD
1.0000 | OROMUCOSAL | Status: DC | PRN
  Filled 2013-03-22: qty 177

## 2013-03-22 MED ORDER — WARFARIN SODIUM 2 MG PO TABS
2.0000 mg | ORAL_TABLET | Freq: Once | ORAL | Status: DC
  Filled 2013-03-22: qty 1

## 2013-03-22 MED ORDER — POTASSIUM CHLORIDE CRYS ER 20 MEQ PO TBCR
40.0000 meq | EXTENDED_RELEASE_TABLET | Freq: Once | ORAL | Status: AC
  Administered 2013-03-22: 40 meq via ORAL
  Filled 2013-03-22: qty 2

## 2013-03-22 MED ORDER — POTASSIUM CHLORIDE CRYS ER 20 MEQ PO TBCR
20.0000 meq | EXTENDED_RELEASE_TABLET | Freq: Two times a day (BID) | ORAL | Status: DC
  Administered 2013-03-22: 20 meq via ORAL
  Filled 2013-03-22: qty 1

## 2013-03-22 NOTE — Progress Notes (Signed)
[facility] PROGRESS NOTE  Gina Ashley ZOX:[id number] DOB: 1927/02/28 DOA: 03/19/2013 PCP: Kimber Relic, MD  Assessment/Plan: Principal Problem:   Acute on chronic diastolic heart failure Active Problems:   DM   HYPERTENSION   COPD (chronic obstructive pulmonary disease)   CKD (chronic kidney disease)   Shortness of breath   Chronic a-fib   Warfarin-induced coagulopathy   1.Acute on ChronicDiastolic CHF- CHF protocol and Diurese with IV lasix given 1 dose of Metalozone in Ed , continue IV lasix 80mg , Monitor her electrolytes BUN/Cr and I/Os. Replete potassium. CT chest without contrast yesterday showed bilateral pleural effusions and extensive right lower lobe collapse. Have requested Dr. Molli Knock evaluate the patient. Heart failure team has been consulted  2. SOB due to #1  3. COPD -stable.  4. DM2- continue and add SSI coverage PRN.  5. HTN- Monitor BPs, and continue carvedilol therapy and lasix Rx.  6. CKD- Monitor Bun/Cr, baseline CR= 2.4. , Creatinine steadily improving 7. Chronic Atrial Fibrillation- Contiune Coumadin Monitor Pt/INRs adjust PRN.  8. Warfarin Induced Coaguloapthy-INR down from 4.3-3.91   Code Status: DO NOT RESUSCITATE  Family Communication: family updated about patient's clinical progress  Disposition Plan: Likely Monday, needs a new SNF   Brief narrative:  Gina Ashley is a 79 y.o. female resident of the [facility] who returns to the ED with complaints of worsening SOB and increasing edema of both of her legs. She denies having Chest pain or fevers or chills. She denies having a cough or chest congestion or wheezes. She was discharged on after a hospitalization for the same. She was evaluated in the ED and referred for admission for an acute exascerbation of her Chronic CHF.    Consultants:  Heart failure team Procedures:  None Antibiotics:  None HPI/Subjective:  Anxious with  shortness of breath , abdominal distention fullness  Appears  to be at baseline  Objective: Filed Vitals:   03/21/13 2113 03/21/13 2144 03/22/13 0417 03/22/13 0655  BP:  126/75 103/68 127/91  Pulse:  85 89 89  Temp:  98.2 F (36.8 C) 98 F (36.7 C)   TempSrc:  Oral Oral   Resp:  20 22   Height:      Weight:   94 kg (207 lb 3.7 oz)   SpO2: 100% 99% 96% 100%    Intake/Output Summary (Last 24 hours) at 03/22/13 0804 Last data filed at 03/22/13 0424  Gross per 24 hour  Intake    598 ml  Output   2678 ml  Net  -2080 ml    Exam:  General: Sitting up in chair. No visible resp difficulty on  HEENT: normal  Neck: supple. JVP elevated but unable to see completely due to body habitus.  Cor: PMI nondisplaced. Regular rate & rhythm. No rubs, gallops or murmurs.  Lungs: clear  Abdomen: obese soft, nontender, no distention. No hepatosplenomegaly. No bruits or masses. Good bowel sounds.  Extremities: no cyanosis, clubbing, rash, tr- 1+ edema  Neuro: alert & orientedx3, cranial nerves grossly intact. moves all 4 extremities w/o difficulty. Affect pleasant t.    Data Reviewed: Basic Metabolic Panel:  Recent Labs Lab 03/16/13 0432 03/17/13 0525 03/19/13 2123 03/21/13 0500 03/22/13 0036  NA 134* 135 131* 134* 134*  K 4.7 4.3 4.7 3.9 3.4*  CL 90* 90* 89* 90* 88*  CO2 38* 41* 34* 39* 39*  GLUCOSE 140* 118* 202* 144* 238*  BUN 62* 64* 68* 68* 64*  CREATININE 2.65* 2.63* 2.84* 2.75* 2.52*  CALCIUM 9.7 9.7 9.7 9.6 9.3    Liver Function Tests:  Recent Labs Lab 03/19/13 2123  AST 18  ALT 10  ALKPHOS 109  BILITOT 0.6  PROT 7.7  ALBUMIN 4.1    Recent Labs Lab 03/19/13 2123  LIPASE 20   No results found for this basename: AMMONIA,  in the last 168 hours  CBC:  Recent Labs Lab 03/19/13 2123 03/22/13 0036  WBC 5.9 5.0  NEUTROABS 4.5  --   HGB 9.6* 8.9*  HCT 30.5* 27.3*  MCV 86.6 86.9  PLT 104* 104*    Cardiac Enzymes:  Recent Labs Lab 03/21/13 1321 03/21/13 1840 03/22/13 0036  TROPONINI <0.30 <0.30 <0.30    BNP (last 3 results)  Recent Labs  03/10/13 2117 03/13/13 0747 03/19/13 2123  PROBNP 4061.0* 4669.0* 4611.0*     CBG:  Recent Labs Lab 03/16/13 0629 03/16/13 1647 03/16/13 2112 03/17/13 0559 03/17/13 1120  GLUCAP 141* 208* 161* 109* 142*    Recent Results (from the past 240 hour(s))  URINE CULTURE     Status: None   Collection Time    03/20/13  1:33 AM      Result Value Range Status   Specimen Description URINE, CATHETERIZED   Final   Special Requests NONE   Final   Culture  Setup Time 03/20/2013 13:18   Final   Colony Count >=100,000 COLONIES/ML   Final   Culture GRAM NEGATIVE RODS   Final   Report Status PENDING   Incomplete     Studies: Dg Chest 2 View  03/19/2013   *RADIOLOGY REPORT*  Clinical Data: Chest and abdominal pain  CHEST - 2 VIEW  Comparison: 03/16/2013  Findings: Moderate cardiac enlargement is again identified. Airspace consolidation within the superior segment of right lower lobe and right middle lobe is again noted and does not appear significantly changed from previous exam.  The left lung is clear. No pleural effusion is identified.  IMPRESSION:  1.  Persistent consolidation involving the right middle lobe and superior segment of right lower lobe. 2.  Cardiac enlargement.   Original Report Authenticated By: Signa Kell, M.D.   Dg Chest 2 View  03/10/2013   *RADIOLOGY REPORT*  Clinical Data: Weakness and chest pain.  No shortness of breath.  CHEST - 2 VIEW  Comparison: 02/18/2013.  Findings: Cardiac enlargement.  Right base effusion appears increased.  Right basilar atelectasis.  No infiltrate or overt edema.  Degenerative changes thoracic spine.  IMPRESSION: Increased right pleural effusion.   Original Report Authenticated By: Davonna Belling, M.D.   Ct Chest Wo Contrast  03/21/2013   *RADIOLOGY REPORT*  Clinical Data: Shortness of breath  CT CHEST WITHOUT CONTRAST  Technique:  Multidetector CT imaging of the chest was performed following the standard  protocol without IV contrast.  Comparison: CT abdomen pelvis 05/22/2012  Findings: The thyroid gland is enlarged, left lobe greater than right. There are several calcifications and multiple nodules within the left thyroid lobe.  One of the calcifications is very dense with calcified and measures 14 mm.  There is some sternal extension of the left lobe the thyroid gland.  Findings are most consistent with thyroid goiter.  There is a moderate to large right pleural effusion and a small left pleural effusion.  These effusions appear simple.  There is moderate cardiomegaly, with dilatation of both atria.  Coronary artery calcifications are present.  There is significant collapse / consolidation of the right lower lobe, overlying the pleural effusion.  There  is scattered areas of atelectasis bilaterally.  There are scattered nonpathologically enlarged mediastinal lymph nodes. Sensitivity for the evaluation for hilar lymphadenopathy is decreased without intravenous contrast.  The thoracic aorta is normal in caliber.  There are extensive degenerative changes of the thoracic spine. Thoracic spine vertebral bodies are normal in height and alignment.  Imaging of the upper abdomen demonstrates a moderate degree of abdominal ascites, most prominent adjacent to the liver.  IMPRESSION:  1.  Moderate to large right and small left pleural effusions. There is extensive collapse and/or consolidation of the right lower lobe. 2. Moderate cardiomegaly with coronary artery atherosclerosis. 3.  Findings suggestive of thyroid goiter. 4.  Abdominal ascites.   Original Report Authenticated By: Britta Mccreedy, M.D.   Dg Abd Acute W/chest  03/16/2013   *RADIOLOGY REPORT*  Clinical Data: Shortness breath, abdominal pain, distention.  ACUTE ABDOMEN SERIES (ABDOMEN 2 VIEW & CHEST 1 VIEW)  Comparison: Chest x-ray 03/10/2013.  Abdomen image 10/19/2012.  Findings: There is cardiomegaly with vascular congestion.  Focal right mid and lower lung  consolidation with moderate right pleural effusion.  No focal opacity on the left.  Nonobstructive bowel gas pattern.  No free air or organomegaly. Prior cholecystectomy.  Moderate stool burden throughout the colon. No suspicious calcifications.  No acute bony abnormality.  IMPRESSION: Right mid and lower lung consolidation.  Question pneumonia. Moderate right pleural effusion.  Cardiomegaly, vascular congestion.  Prior cholecystectomy.   Original Report Authenticated By: Charlett Nose, M.D.    Scheduled Meds: . albuterol  2.5 mg Nebulization QID  . allopurinol  100 mg Oral Daily  . amiodarone  400 mg Oral Daily  . busPIRone  5 mg Oral Q12H  . carvedilol  25 mg Oral BID WC  . guaiFENesin  600 mg Oral BID  . isosorbide-hydrALAZINE  1 tablet Oral BID  . OxyCODONE  10 mg Oral Q12H  . pantoprazole  40 mg Oral Daily  . polyethylene glycol  17 g Oral BID  . sodium chloride  3 mL Intravenous Q12H  . Warfarin - Pharmacist Dosing Inpatient   Does not apply q1800   Continuous Infusions:   Principal Problem:   Acute on chronic diastolic heart failure Active Problems:   DM   HYPERTENSION   COPD (chronic obstructive pulmonary disease)   CKD (chronic kidney disease)   Shortness of breath   Chronic a-fib   Warfarin-induced coagulopathy    Time spent: 40 minutes   [facility]  [facility] Pager [phone]. If 8PM-8AM, please contact night-coverage at [email or web address], password [facility] 03/22/2013, 8:04 AM  LOS: 3 days

## 2013-03-22 NOTE — Progress Notes (Addendum)
Inpatient Diabetes Program Recommendations  AACE/ADA: New Consensus Statement on Inpatient Glycemic Control (2013)  Target Ranges:  Prepandial:   less than 140 mg/dL      Peak postprandial:   less than 180 mg/dL (1-2 hours)      Critically ill patients:  140 - 180 mg/dL    Results for Gina Ashley, Gina Ashley (MRN [id number]) as of 03/22/2013 09:48  Ref. Range 03/22/2013 00:36  Glucose Latest Range: 70-99 mg/dL 119 (H)   Patient with history of type 2 diabetes.  No CBGs documented since 05/14.   Inpatient Diabetes Program Recommendations Correction (SSI): Please check CBGs and cover with Novolog Sensitive correction scale (SSI) tid ac + HS if elevated.   Will follow. Ambrose Finland RN, MSN, CDE Diabetes Coordinator Inpatient Diabetes Program [phone]

## 2013-03-22 NOTE — Evaluation (Signed)
Occupational Therapy Evaluation Patient Details Name: Gina Ashley MRN: [id number] DOB: 1927/09/28 Today's Date: 03/22/2013 Time: 1478-2956 OT Time Calculation (min): 31 min  OT Assessment / Plan / Recommendation Clinical Impression  This 79 yo female admitted with SOB and Increased Swelling of Both Legs presents to acute OT with problems below. Will benefit from acute OT with follow up OT at SNF.    OT Assessment  Patient needs continued OT Services    Follow Up Recommendations  SNF    Barriers to Discharge Decreased caregiver support    Equipment Recommendations  None recommended by OT       Frequency  Min 2X/week    Precautions / Restrictions Precautions Precautions: Fall Precaution Comments: LLE upright AFO Restrictions Weight Bearing Restrictions: No       ADL  Eating/Feeding: Simulated;Independent Where Assessed - Eating/Feeding: Edge of bed Grooming: Simulated;Set up Where Assessed - Grooming: Unsupported sitting Upper Body Bathing: Simulated;Set up Where Assessed - Upper Body Bathing: Unsupported sitting Lower Body Bathing: Simulated;Maximal assistance (witih additional +1 for standing) Where Assessed - Lower Body Bathing: Unsupported standing Upper Body Dressing: Simulated;Set up Where Assessed - Upper Body Dressing: Unsupported sitting Lower Body Dressing: Simulated;+1 Total assistance (witih additional +1 for standing) Where Assessed - Lower Body Dressing: Supported sit to stand Toilet Transfer: Chief of Staff: Patient Percentage: 70% Statistician Method: Surveyor, minerals:  (Bed>recliner going to pt's right) Toileting - Architect and Hygiene: Simulated;+1 Total assistance (witih additional +1 for standing) Where Assessed - Toileting Clothing Manipulation and Hygiene: Standing Transfers/Ambulation Related to ADLs: Mod A sit to stand, total A +2 (pt= 50%) stand to sit due to decreased  control of descent ADL Comments: Per PT who saw pt last time, she definitely stands better with her shoes on    OT Diagnosis: Generalized weakness  OT Problem List: Decreased strength;Decreased activity tolerance;Decreased knowledge of use of DME or AE OT Treatment Interventions: Self-care/ADL training;Balance training;DME and/or AE instruction;Patient/family education;Therapeutic activities   OT Goals Acute Rehab OT Goals OT Goal Formulation: With patient Time For Goal Achievement: 03/29/13 Potential to Achieve Goals: Good ADL Goals Pt Will Perform Lower Body Dressing: with min assist;Sit to stand from chair;Sit to stand from bed;Unsupported;with adaptive equipment ADL Goal: Lower Body Dressing - Progress: Goal set today Pt Will Transfer to Toilet: with min assist;with DME;Comfort height toilet;Regular height toilet;Grab bars;3-in-1;Ambulation ADL Goal: Toilet Transfer - Progress: Goal set today Pt Will Perform Toileting - Clothing Manipulation: with min assist;Standing ADL Goal: Toileting - Clothing Manipulation - Progress: Goal set today Pt Will Perform Toileting - Hygiene: with mod assist;Sit to stand from 3-in-1/toilet ADL Goal: Toileting - Hygiene - Progress: Goal set today Arm Goals Pt Will Complete Theraband Exer: with supervision, verbal cues required/provided;to maintain strength;1 set;10 reps;Level 1 Theraband;Bilateral upper extremities Arm Goal: Theraband Exercises - Progress: Goal set today  Visit Information  Last OT Received On: 03/29/13 Assistance Needed: +2 PT/OT Co-Evaluation/Treatment: Yes    Subjective Data  Patient Stated Goal: Go to [facility]   Prior Functioning     Home Living Available Help at Discharge: Skilled Nursing Facility         Vision/Perception Vision - History Baseline Vision: Wears glasses all the time Patient Visual Report: No change from baseline   Cognition  Cognition Arousal/Alertness: Awake/alert Behavior During  Therapy: WFL for tasks assessed/performed Overall Cognitive Status: Within Functional Limits for tasks assessed    Extremity/Trunk Assessment Right Upper Extremity Assessment RUE ROM/Strength/Tone:  Within functional levels Left Upper Extremity Assessment LUE ROM/Strength/Tone: Within functional levels     Mobility Bed Mobility Bed Mobility: Supine to Sit;Sitting - Scoot to Edge of Bed Supine to Sit: 6: Modified independent (Device/Increase time);With rails;HOB elevated (40 degrees) Sitting - Scoot to Edge of Bed: 4: Min assist (due to bed rail under her right thigh) Transfers Transfers: Sit to Stand;Stand to Sit Sit to Stand: 3: Mod assist;With upper extremity assist;From bed Stand to Sit: 1: +2 Total assist;Without upper extremity assist;To chair/3-in-1 Stand to Sit: Patient Percentage: 50% Details for Transfer Assistance: Decreased control for descent and pt did not attempt to reach back for arms of recliner           End of Session OT - End of Session Equipment Utilized During Treatment: Gait belt (LLE uprgiht AFO, right shoe) Activity Tolerance: Patient limited by fatigue (when standing) Patient left: in chair;with call bell/phone within reach;with family/visitor present Nurse Communication: Mobility status       [name] [phone] 03/22/2013, 10:22 AM

## 2013-03-22 NOTE — Progress Notes (Signed)
Patient placed on Auto Titrate CPAP using a nasal mask with 2L oxygen bled in.  Patient tolerating well at this time.

## 2013-03-22 NOTE — Consult Note (Signed)
Advanced Heart Failure Team Consult Note  Referring Physician: Dr Lynford Citizen Primary Physician:  Primary Cardiologist:  Dr Gala Romney   Reason for Consultation: Gina Ashley  HPI:    The Heart Failure Team was asked to provide a consult by Dr  Lynford Citizen for volume overload.   Gina Ashley is an 79 y.o. female with history of chronic atrial fibrillation on coumadin, diastolic heart failure and HTN. She also has DM type 2, COPD, OSA on CPAP, A Fib- chronic coumadin, and CKD failure with baseline Cr 2.2. She has had 5 hospitalizations in the last 6 months, 1 included tibia/fibula fracture, 1 for UTI, and the rest have been due to massive fluid overload in the setting of diastolic heart failure. Per nephrology she is not a candidate for HD.   Admitted March 2014 with massive volume overload. Weight 280 pounds. She required Milrinone and Dopamine. Permanent atrial fib and went for TEE/DCCV on 3/18 but unable to complete due to smoke. Continued on amio for rate control and coumadin. Evaluated by Nephrology due to renal failure and she was not a candidate for HD. Discharged to SNF. Discharge weight 212 pounds.   Discharged from [facility] 03/17/13 after being diuresed with Lasix drip and later transitioned to 60 mg of Torsemide daily. She was discharged back to SNF.  Discharge weight 217 pounds.   She presented to [facility] ED 03/20/13 with increased dyspnea and lower extremity edema.Weight up to 220 She was given 80 mg IV lasix and Metolazone 2.5 mg daily. CT with large R pleural effusion with associate RLL collpase  Evaluated by CCM today for pleural effusion however thoracentesis deferred due to high INR and uncertainty to the benefit.    Review of Systems: [y] = yes, [ ]  = no   General: Weight gain [Y ]; Weight loss [ ] ; Anorexia [ ] ; Fatigue [Y ]; Fever [ ] ; Chills [ ] ; Weakness [ ]   Cardiac: Chest pain/pressure [ ] ; Resting SOB [ ] ; Exertional SOB [Y ]; Orthopnea [Y ]; Pedal Edema [Y]; Palpitations [ ] ; Syncope [  ]; Presyncope [ ] ; Paroxysmal nocturnal dyspnea[ ]   Pulmonary: Cough [ ] ; Wheezing[ ] ; Hemoptysis[ ] ; Sputum [ ] ; Snoring [ ]   GI: Vomiting[ ] ; Dysphagia[ ] ; Melena[ ] ; Hematochezia [ ] ; Heartburn[ ] ; Abdominal pain [ ] ; Constipation [ ] ; Diarrhea [ ] ; BRBPR [ ]   GU: Hematuria[ ] ; Dysuria [ ] ; Nocturia[ ]   Vascular: Pain in legs with walking [ ] ; Pain in feet with lying flat [ ] ; Non-healing sores [ ] ; Stroke [ ] ; TIA [ ] ; Slurred speech [ ] ;  Neuro: Headaches[ ] ; Vertigo[ ] ; Seizures[ ] ; Paresthesias[ ] ;Blurred vision [ ] ; Diplopia [ ] ; Vision changes [ ]   Ortho/Skin: Arthritis [ ] ; Joint pain [Y ]; Muscle pain [ Y]; Joint swelling [ ] ; Back Pain [Y ]; Rash [ ]   Psych: Depression[ ] ; Anxiety[ ]   Heme: Bleeding problems [ ] ; Clotting disorders [ ] ; Anemia [ ]   Endocrine: Diabetes [Y ]; Thyroid dysfunction[ ]   Home Medications Prior to Admission medications   Medication Sig Start Date End Date Taking? Authorizing Provider  acetaminophen (TYLENOL) 325 MG tablet Take 650 mg by mouth every 6 (six) hours as needed for pain.   Yes Historical Provider, MD  albuterol (PROVENTIL) (2.5 MG/3ML) 0.083% nebulizer solution Take 3 mLs (2.5 mg total) by nebulization every 2 (two) hours as needed for wheezing or shortness of breath. 02/12/13  Yes Shanker Levora Dredge, MD  allopurinol (ZYLOPRIM) 100 MG tablet Take 100  mg by mouth daily.   Yes Historical Provider, MD  ALPRAZolam (XANAX) 0.25 MG tablet Take 0.25 mg by mouth every 8 (eight) hours as needed for anxiety.   Yes Historical Provider, MD  amiodarone (PACERONE) 200 MG tablet Take 400 mg by mouth daily.   Yes Historical Provider, MD  busPIRone (BUSPAR) 5 MG tablet Take 5 mg by mouth every 12 (twelve) hours.   Yes Historical Provider, MD  carvedilol (COREG) 25 MG tablet Take 25 mg by mouth 2 (two) times daily with a meal.   Yes Historical Provider, MD  diphenhydrAMINE (BENADRYL) 25 mg capsule Take 25 mg by mouth every 8 (eight) hours as needed for itching.   Yes  Historical Provider, MD  docusate sodium (COLACE) 100 MG capsule Take 100 mg by mouth every 8 (eight) hours.    Yes Historical Provider, MD  furosemide (LASIX) 40 MG tablet Take 40 mg by mouth daily.   Yes Historical Provider, MD  guaiFENesin (MUCINEX) 600 MG 12 hr tablet Take 600 mg by mouth 2 (two) times daily.    Yes Historical Provider, MD  insulin aspart (NOVOLOG) 100 UNIT/ML injection Inject 3 Units into the skin 3 (three) times daily with meals. If cbg is greater than 150 09/15/12  Yes Lesle Chris Black, NP  insulin glargine (LANTUS) 100 UNIT/ML injection Inject 5 Units into the skin at bedtime.   Yes Historical Provider, MD  isosorbide-hydrALAZINE (BIDIL) 20-37.5 MG per tablet Take 1 tablet by mouth 2 (two) times daily. 01/22/13  Yes Erick Blinks, MD  Multiple Vitamin (MULTIVITAMIN WITH MINERALS) TABS Take 1 tablet by mouth daily.   Yes Historical Provider, MD  nitroGLYCERIN (NITROSTAT) 0.4 MG SL tablet Place 0.4 mg under the tongue every 5 (five) minutes as needed for chest pain.   Yes Historical Provider, MD  omeprazole (PRILOSEC) 20 MG capsule Take 40 mg by mouth daily.   Yes Historical Provider, MD  ondansetron (ZOFRAN) 4 MG tablet Take 1 tablet (4 mg total) by mouth every 6 (six) hours as needed for nausea. 02/12/13  Yes Shanker Levora Dredge, MD  oxyCODONE (OXY IR/ROXICODONE) 5 MG immediate release tablet Take 5 mg by mouth every 4 (four) hours as needed for pain (breakthrough).    Yes Historical Provider, MD  OxyCODONE (OXYCONTIN) 10 mg T12A Take 1 tablet (10 mg total) by mouth every 12 (twelve) hours. 03/17/13  Yes Kimber Relic, MD  polyethylene glycol [facility] Ethelene Hal) packet Take 17 g by mouth 2 (two) times daily.    Yes Historical Provider, MD  saccharomyces boulardii (FLORASTOR) 250 MG capsule Take 1 capsule (250 mg total) by mouth 2 (two) times daily. 02/12/13  Yes Shanker Levora Dredge, MD  simethicone (MYLICON) 80 MG chewable tablet Chew 80 mg by mouth every 6 (six) hours as needed for  flatulence.   Yes Historical Provider, MD  traZODone (DESYREL) 50 MG tablet Take 50 mg by mouth at bedtime.   Yes Historical Provider, MD  warfarin (COUMADIN) 3 MG tablet Take 3 mg by mouth daily.   Yes Historical Provider, MD    Past Medical History: Past Medical History  Diagnosis Date  . GERD (gastroesophageal reflux disease)   . Hiatal hernia   . HTN (hypertension)     all her life  . CKD (chronic kidney disease), stage III     GFR: 38  . Atrial fibrillation     since 1996  . Depression   . Chronic diastolic heart failure 08/31/2012  . Tibia fracture  BILATERAL  . Renal failure     acute on chronic   . Respiratory failure     acute on chronic hx of  requiring BIPAP  . COPD (chronic obstructive pulmonary disease)   . Metabolic encephalopathy     hx of   . Renal failure     acute on chronic with need for intermittent dialysis - hx of   . UTI (urinary tract infection)     hx of   . Pleural effusion     right sided now resolved   . CHF (congestive heart failure)     diastolic HF  . Complication of anesthesia     "hard to wake up" (03/11/2013)  . OSA on CPAP   . Type II diabetes mellitus 1980's?  . History of blood transfusion 08/2012    "related to leg OR" (03/11/2013)  . Arthritis     "left arm; lower back, hips, hands" (03/11/2013)  . Chronic lower back pain     "disc pops in and out" (03/11/2013)  . Gout   . Anxiety   . Fall 08/2012    "in nursing home" (03/11/2013)  . Anemia   . Chronic a-fib 03/12/2013    Past Surgical History: Past Surgical History  Procedure Laterality Date  . Bunionectomy Bilateral     bilateral  . Cholecystectomy    . Shoulder arthroscopy w/ rotator cuff repair Left 2002  . Total knee arthroplasty Bilateral 2001  . Hammer toe surgery Bilateral 2004  . Colonoscopy  07/11/2011    Procedure: COLONOSCOPY;  Surgeon: Malissa Hippo, MD;  Location: [facility] ENDO SUITE;  Service: Endoscopy;  Laterality: N/A;  3:00  . Orif periprosthetic fracture   09/08/2012    Procedure: OPEN REDUCTION INTERNAL FIXATION (ORIF) PERIPROSTHETIC FRACTURE;  Surgeon: Shelda Pal, MD;  Location: [facility] ORS;  Service: Orthopedics;  Laterality: Right;  ORIF periprosthetic right proximal femur fracture   . External fixation leg  09/08/2012    Procedure: EXTERNAL FIXATION LEG;  Surgeon: Shelda Pal, MD;  Location: [facility] ORS;  Service: Orthopedics;  Laterality: Left;  . Joint replacement    . External fixation leg  11/30/2012    Procedure: EXTERNAL FIXATION LEG;  Surgeon: Shelda Pal, MD;  Location: [facility] ORS;  Service: Orthopedics;  Laterality: Left;  Removal of External Fixation Left Knee with Evaluation with Floroscopy  . Tee without cardioversion N/A 01/19/2013    Procedure: TRANSESOPHAGEAL ECHOCARDIOGRAM (TEE);  Surgeon: Lewayne Bunting, MD;  Location[facility] ENDOSCOPY;  Service: Cardiovascular;  Laterality: N/A;  . Shoulder open rotator cuff repair Left 2002    "maybe 3 months after scope" (03/11/2013)  . Femur fracture surgery Right 08/2012    "fell at nursing home" (03/11/2013)  . Tibia fracture surgery Left 08/2012    "fell at nursing home" (03/11/2013)    Family History: Family History  Problem Relation Age of Onset  . Colon cancer Brother     Social History: History   Social History  . Marital Status: Widowed    Spouse Name: N/A    Number of Children: N/A  . Years of Education: N/A   Social History Main Topics  . Smoking status: Never Smoker   . Smokeless tobacco: Never Used  . Alcohol Use: No  . Drug Use: No  . Sexually Active: No   Other Topics Concern  . None   Social History Narrative  . None    Allergies:  Allergies  Allergen Reactions  . Morphine Sulfate  Other (See Comments)    REACTION: change in personality  . Remeron (Mirtazapine) Other (See Comments)    Altered mental status, lethargy  . Tuna (Fish Allergy) Other (See Comments)    unknown  . Penicillins Rash    Tolerates Ancef.    Objective:    Vital Signs:   Temp:   [98 F (36.7 C)-98.2 F (36.8 C)] 98.1 F (36.7 C) (05/19 1317) Pulse Rate:  [81-89] 81 (05/19 1317) Resp:  [20-22] 20 (05/19 1317) BP: (103-130)/(61-91) 130/61 mmHg (05/19 1317) SpO2:  [96 %-100 %] 98 % (05/19 1358) Weight:  [94 kg (207 lb 3.7 oz)] 94 kg (207 lb 3.7 oz) (05/19 0417) Last BM Date: 03/21/13  Weight change: Filed Weights   03/20/13 0243 03/21/13 0400 03/22/13 0417  Weight: 100.6 kg (221 lb 12.5 oz) 99.9 kg (220 lb 3.8 oz) 94 kg (207 lb 3.7 oz)    Intake/Output:   Intake/Output Summary (Last 24 hours) at 03/22/13 1507 Last data filed at 03/22/13 1317  Gross per 24 hour  Intake    835 ml  Output   2777 ml  Net  -1942 ml     Physical Exam: General:  Elderly chronically ill  appearing. No resp difficulty HEENT: normal Neck: supple. JVP to jaw . Carotids 2+ bilat; no bruits. No lymphadenopathy or thryomegaly appreciated. Cor: PMI nondisplaced. Regular rate & rhythm. No rubs, gallops or murmurs. Lungs: clear Abdomen: soft, nontender, nondistended. No hepatosplenomegaly. No bruits or masses. Good bowel sounds. Extremities: no cyanosis, clubbing, rash, edema R and LLE ted hose Neuro: alert & orientedx3, cranial nerves grossly intact. moves all 4 extremities w/o difficulty. Affect pleasant  Telemetry:  AF  Labs: Basic Metabolic Panel:  Recent Labs Lab 03/16/13 0432 03/17/13 0525 03/19/13 2123 03/21/13 0500 03/22/13 0036  NA 134* 135 131* 134* 134*  K 4.7 4.3 4.7 3.9 3.4*  CL 90* 90* 89* 90* 88*  CO2 38* 41* 34* 39* 39*  GLUCOSE 140* 118* 202* 144* 238*  BUN 62* 64* 68* 68* 64*  CREATININE 2.65* 2.63* 2.84* 2.75* 2.52*  CALCIUM 9.7 9.7 9.7 9.6 9.3    Liver Function Tests:  Recent Labs Lab 03/19/13 2123  AST 18  ALT 10  ALKPHOS 109  BILITOT 0.6  PROT 7.7  ALBUMIN 4.1    Recent Labs Lab 03/19/13 2123  LIPASE 20   No results found for this basename: AMMONIA,  in the last 168 hours  CBC:  Recent Labs Lab 03/19/13 2123 03/22/13 0036   WBC 5.9 5.0  NEUTROABS 4.5  --   HGB 9.6* 8.9*  HCT 30.5* 27.3*  MCV 86.6 86.9  PLT 104* 104*    Cardiac Enzymes:  Recent Labs Lab 03/21/13 1321 03/21/13 1840 03/22/13 0036  TROPONINI <0.30 <0.30 <0.30    BNP: BNP (last 3 results)  Recent Labs  03/10/13 2117 03/13/13 0747 03/19/13 2123  PROBNP 4061.0* 4669.0* 4611.0*    CBG:  Recent Labs Lab 03/16/13 0629 03/16/13 1647 03/16/13 2112 03/17/13 0559 03/17/13 1120  GLUCAP 141* 208* 161* 109* 142*    Coagulation Studies:  Recent Labs  03/20/13 0530 03/21/13 0500 03/22/13 0036  LABPROT 38.6* 36.0* 30.0*  INR 4.30* 3.91* 3.06*    Other results: EKG:  Imaging: Dg Chest 2 View  03/22/2013   *RADIOLOGY REPORT*  Clinical Data: Shortness of breath, follow up of effusion  CHEST - 2 VIEW  Comparison: CT chest of 03/21/2013 and chest x-ray of 03/19/2013  Findings: Opacity remains throughout much of  the right lung base most consistent with a moderate to large right pleural effusion with atelectasis of the right lower lobe.  A small left pleural effusion remains.  Cardiomegaly is stable and there does appear to be mild pulmonary vascular congestion present.  Deviation of the tracheal air shadow to the right of midline is most consistent with thyroid goiter with a focus of calcification noted in the lower left neck.  IMPRESSION:  1.  Little change to some increase in volume of the right pleural effusion with right basilar atelectasis. 2.  Tiny left pleural effusion. 3.  Cardiomegaly and pulmonary vascular congestion. 4.  Probable thyroid goiter.  Correlate clinically.   Original Report Authenticated By: Dwyane Dee, M.D.   Ct Chest Wo Contrast  03/21/2013   *RADIOLOGY REPORT*  Clinical Data: Shortness of breath  CT CHEST WITHOUT CONTRAST  Technique:  Multidetector CT imaging of the chest was performed following the standard protocol without IV contrast.  Comparison: CT abdomen pelvis 05/22/2012  Findings: The thyroid gland  is enlarged, left lobe greater than right. There are several calcifications and multiple nodules within the left thyroid lobe.  One of the calcifications is very dense with calcified and measures 14 mm.  There is some sternal extension of the left lobe the thyroid gland.  Findings are most consistent with thyroid goiter.  There is a moderate to large right pleural effusion and a small left pleural effusion.  These effusions appear simple.  There is moderate cardiomegaly, with dilatation of both atria.  Coronary artery calcifications are present.  There is significant collapse / consolidation of the right lower lobe, overlying the pleural effusion.  There is scattered areas of atelectasis bilaterally.  There are scattered nonpathologically enlarged mediastinal lymph nodes. Sensitivity for the evaluation for hilar lymphadenopathy is decreased without intravenous contrast.  The thoracic aorta is normal in caliber.  There are extensive degenerative changes of the thoracic spine. Thoracic spine vertebral bodies are normal in height and alignment.  Imaging of the upper abdomen demonstrates a moderate degree of abdominal ascites, most prominent adjacent to the liver.  IMPRESSION:  1.  Moderate to large right and small left pleural effusions. There is extensive collapse and/or consolidation of the right lower lobe. 2. Moderate cardiomegaly with coronary artery atherosclerosis. 3.  Findings suggestive of thyroid goiter. 4.  Abdominal ascites.   Original Report Authenticated By: Britta Mccreedy, M.D.     Medications:     Current Medications: . allopurinol  100 mg Oral Daily  . amiodarone  400 mg Oral Daily  . busPIRone  5 mg Oral Q12H  . carvedilol  25 mg Oral BID WC  . guaiFENesin  600 mg Oral BID  . ipratropium  0.5 mg Nebulization Q6H  . isosorbide-hydrALAZINE  1 tablet Oral BID  . levalbuterol  0.63 mg Nebulization Q6H  . OxyCODONE  10 mg Oral Q12H  . pantoprazole  40 mg Oral Daily  . polyethylene glycol   17 g Oral BID  . potassium chloride  20 mEq Oral BID  . sodium chloride  3 mL Intravenous Q12H  . warfarin  2 mg Oral ONCE-1800  . Warfarin - Pharmacist Dosing Inpatient   Does not apply q1800    Infusions:     Assessment:  1. Acute on chronic diastolic HF   2. R Pleural effusion, large 3.Acute on chronic renal failure. (Creatinine baseline 2.6) 4. HTN  5. Obesity, suspect OHS/OSA  6. Chronic atrial fib   - on coumadin  7. Deconditioning  8. DNR 9. Cardiorenal syndrome, severe   Plan/Discussion:    Gina Ashley is well known to the HF team. She has multiple recent admits for recurrent diastolic HF complicated by severe cardiorenal syndrome. Previously we were able to diurese her from 270 pounds to 215 pounds but anytime we get below this creatinine bumps close to 3 and we have to pull back. Renal has determined that she is not HD candidate so we have been reluctant to pus diuresis any further. She now gets profoundly symptomatic with only a 3-4 pound weight gain. Creatinine now sits in 2.5-2.6 range. I think she is nearing the point where the best option will be palliative care. Her and her daughters are interested in speaking with them to discuss options.  For symptomatic relief we will restart lasix drip at 15 and see if we can push weight down further while keeping Cr < 3.0. I also think she may benefit from trial of thoracentesis although they are aware fluid may come back quickly. I d/w Dr. Molli Knock. Will give FFP today and will plan thora tomorrow if INR acceptable.   We previously discussed attempt at [facility] but I doubt restoring NSR will have major effect and with recent LAA clot we cannot consider DC-CV if we are holding anti-coagulation for thora.   I discussed this withe her and her 2 daughters at length.  Plan: 1) Lasix 15mg /hr 2) FFP to reverse INR 3) Thoracentesis per P/CCM possibly 5/20 4) Palliative Care Consult   Gina Lanzo,MD 3:15 PM Length of Stay:  3  Advanced Heart Failure Team Pager [phone] (M-F; 7a - 4p)  Please contact [facility] for night-coverage after hours (4p -7a ) and weekends on [email or web address]

## 2013-03-22 NOTE — Progress Notes (Signed)
Pt c/o of feeling like she can't breath.  O2 sats 99% on 2L [place], pt is stable.

## 2013-03-22 NOTE — Progress Notes (Signed)
Pt is requesting for code status to be changed to DNR, need an order for this, Thanks Lavonda Jumbo RN

## 2013-03-22 NOTE — Progress Notes (Addendum)
ANTICOAGULATION CONSULT NOTE - Follow Up Consult  Pharmacy Consult for coumadin Indication: atrial fibrillation  Allergies  Allergen Reactions  . Morphine Sulfate Other (See Comments)    REACTION: change in personality  . Remeron (Mirtazapine) Other (See Comments)    Altered mental status, lethargy  . Tuna (Fish Allergy) Other (See Comments)    unknown  . Penicillins Rash    Tolerates Ancef.    Patient Measurements: Height: 5\' 3"  (160 cm) Weight: 207 lb 3.7 oz (94 kg) IBW/kg (Calculated) : 52.4  Vital Signs: Temp: 98 F (36.7 C) (05/19 0417) Temp src: Oral (05/19 0417) BP: 127/91 mmHg (05/19 0655) Pulse Rate: 89 (05/19 0655)  Labs:  Recent Labs  03/19/13 2123 03/20/13 0530 03/21/13 0500 03/21/13 1321 03/21/13 1840 03/22/13 0036  HGB 9.6*  --   --   --   --  8.9*  HCT 30.5*  --   --   --   --  27.3*  PLT 104*  --   --   --   --  104*  LABPROT  --  38.6* 36.0*  --   --  30.0*  INR  --  4.30* 3.91*  --   --  3.06*  CREATININE 2.84*  --  2.75*  --   --  2.52*  TROPONINI  --   --   --  <0.30 <0.30 <0.30    Estimated Creatinine Clearance: 18.4 ml/min (by C-G formula based on Cr of 2.52).   Medications:  Scheduled:  . albuterol  2.5 mg Nebulization QID  . allopurinol  100 mg Oral Daily  . amiodarone  400 mg Oral Daily  . busPIRone  5 mg Oral Q12H  . carvedilol  25 mg Oral BID WC  . guaiFENesin  600 mg Oral BID  . isosorbide-hydrALAZINE  1 tablet Oral BID  . OxyCODONE  10 mg Oral Q12H  . pantoprazole  40 mg Oral Daily  . polyethylene glycol  17 g Oral BID  . sodium chloride  3 mL Intravenous Q12H  . Warfarin - Pharmacist Dosing Inpatient   Does not apply q1800    Assessment: 79 yo female admitted with SOB and leg swelling noted with HF . Pharmacy consulted to manage Coumadin. INR today is 3.06 with INR trend down.  Home coumadin dose: 3mg /day  Goal of Therapy:  INR 2-3 Monitor platelets by anticoagulation protocol: Yes   Plan:  -Coumadin 2mg   today -Daily PT/INR  Harland German, Pharm D 03/22/2013 8:06 AM  Plan is for thoracentesis tomorrow. INR 3 this morning and trending down very quickly. Orders received for [facility] and d/w NP with HF team and will hold warfarin for now, if INR <2 in am will consider heparin bridge.  Sheppard Coil PharmD., BCPS Clinical Pharmacist Pager [phone] 03/22/2013 3:20 PM

## 2013-03-22 NOTE — Progress Notes (Signed)
Physical Therapy Evaluation Note  Past Medical History  Diagnosis Date  . GERD (gastroesophageal reflux disease)   . Hiatal hernia   . HTN (hypertension)     all her life  . CKD (chronic kidney disease), stage III     GFR: 38  . Atrial fibrillation     since 1996  . Depression   . Chronic diastolic heart failure 08/31/2012  . Tibia fracture     BILATERAL  . Renal failure     acute on chronic   . Respiratory failure     acute on chronic hx of  requiring BIPAP  . COPD (chronic obstructive pulmonary disease)   . Metabolic encephalopathy     hx of   . Renal failure     acute on chronic with need for intermittent dialysis - hx of   . UTI (urinary tract infection)     hx of   . Pleural effusion     right sided now resolved   . CHF (congestive heart failure)     diastolic HF  . Complication of anesthesia     "hard to wake up" (03/11/2013)  . OSA on CPAP   . Type II diabetes mellitus 1980's?  . History of blood transfusion 08/2012    "related to leg OR" (03/11/2013)  . Arthritis     "left arm; lower back, hips, hands" (03/11/2013)  . Chronic lower back pain     "disc pops in and out" (03/11/2013)  . Gout   . Anxiety   . Fall 08/2012    "in nursing home" (03/11/2013)  . Anemia   . Chronic a-fib 03/12/2013    Past Surgical History  Procedure Laterality Date  . Bunionectomy Bilateral     bilateral  . Cholecystectomy    . Shoulder arthroscopy w/ rotator cuff repair Left 2002  . Total knee arthroplasty Bilateral 2001  . Hammer toe surgery Bilateral 2004  . Colonoscopy  07/11/2011    Procedure: COLONOSCOPY;  Surgeon: Malissa Hippo, MD;  [facility];  Service: Endoscopy;  Laterality: N/A;  3:00  . Orif periprosthetic fracture  09/08/2012    Procedure: OPEN REDUCTION INTERNAL FIXATION (ORIF) PERIPROSTHETIC FRACTURE;  Surgeon: Shelda Pal, MD;  Location: [facility];  Service: Orthopedics;  Laterality: Right;  ORIF periprosthetic right proximal femur fracture   . External  fixation leg  09/08/2012    Procedure: EXTERNAL FIXATION LEG;  Surgeon: Shelda Pal, MD;  Location: [facility];  Service: Orthopedics;  Laterality: Left;  . Joint replacement    . External fixation leg  11/30/2012    Procedure: EXTERNAL FIXATION LEG;  Surgeon: Shelda Pal, MD;  Location: [facility];  Service: Orthopedics;  Laterality: Left;  Removal of External Fixation Left Knee with Evaluation with Floroscopy  . Tee without cardioversion N/A 01/19/2013    Procedure: TRANSESOPHAGEAL ECHOCARDIOGRAM (TEE);  Surgeon: Lewayne Bunting, MD;  Location: [facility] ENDOSCOPY;  Service: Cardiovascular;  Laterality: N/A;  . Shoulder open rotator cuff repair Left 2002    "maybe 3 months after scope" (03/11/2013)  . Femur fracture surgery Right 08/2012    "fell at nursing home" (03/11/2013)  . Tibia fracture surgery Left 08/2012    "fell at nursing home" (03/11/2013)      03/22/13 1610  PT Visit Information  Last PT Received On 03/22/13  Assistance Needed +2  PT/OT Co-Evaluation/Treatment Yes  PT Time Calculation  PT Start Time 0819  PT Stop Time 0853  PT Time  Calculation (min) 34 min  Subjective Data  Subjective Pt received supine in bed agreeable to PT  Precautions  Precautions Fall  Precaution Comments L LE upright AFL  Restrictions  Weight Bearing Restrictions No  Home Living  Available Help at Discharge Skilled Nursing Facility  Communication  Communication No difficulties  Cognition  Arousal/Alertness Awake/alert  Behavior During Therapy [facility] for tasks assessed/performed  Overall Cognitive Status Within Functional Limits for tasks assessed  Right Upper Extremity Assessment  RUE ROM/Strength/Tone [facility] for tasks assessed  Left Upper Extremity Assessment  LUE ROM/Strength/Tone WFL for tasks assessed  Right Lower Extremity Assessment  RLE ROM/Strength/Tone Deficits (grossly 4-/5)  Left Lower Extremity Assessment  LLE ROM/Strength/Tone Deficits  LLE ROM/Strength/Tone Deficits ankle 0/5, knee 2/5,  decreased AROM hip and knee  Trunk Assessment  Trunk Assessment Normal  Bed Mobility  Bed Mobility Supine to Sit;Sitting - Scoot to Edge of Bed  Supine to Sit 6: Modified independent (Device/Increase time);With rails;HOB elevated (40 degrees)  Sitting - Scoot to Edge of Bed 4: Min assist (due to bed rail under her right thigh)  Transfers  Transfers Sit to Stand;Stand to Sit;Stand Pivot Transfers  Sit to Stand 3: Mod assist;With upper extremity assist;From bed  Stand to Sit 1: +2 Total assist;Without upper extremity assist;To chair/3-in-1  Stand to Sit: Patient Percentage 50%  Stand Pivot Transfers 1: +2 Total assist  Stand Pivot Transfers: Patient Percentage 60%  Details for Transfer Assistance v/c's for sequencing, pt able to clear each foot however required tactile cues for bilat hip ext  Ambulation/Gait  Ambulation/Gait Assistance Not tested (comment)  Static Sitting Balance  Static Sitting - Balance Support No upper extremity supported  Static Sitting - Level of Assistance 5: Stand by assistance  Static Sitting - Comment/# of Minutes 5  Static Standing Balance  Static Standing - Balance Support Bilateral upper extremity supported  Static Standing - Level of Assistance 3: Mod assist  Static Standing - Comment/# of Minutes 1 min  PT - End of Session  Equipment Utilized During Treatment Gait belt;Oxygen  Activity Tolerance Patient tolerated treatment well  Patient left in chair;with call bell/phone within reach;with family/visitor present  Nurse Communication Mobility status  PT Assessment  Clinical Impression Statement Pt is a 79 yo female admitted for CHF exacerbation. Pt recently d/c from [facility] last week for CHF exacerbation. Pt with increased abiltiy to participate in OOB mobility with L LE upright AFO. Pt remains to have generalized weakness, generalized deconditioning, and dec activity tolerance. Pt con't to benefit from Skilled PT to address deficits and achieve maximal  functional independence.  PT Recommendation/Assessment Patient needs continued PT services  PT Problem List Decreased strength;Decreased range of motion;Decreased activity tolerance;Decreased balance;Decreased mobility;Cardiopulmonary status limiting activity;Decreased knowledge of use of DME;Pain  Barriers to Discharge None  PT Therapy Diagnosis  Difficulty walking;Generalized weakness  PT Plan  PT Frequency Min 3X/week  PT Treatment/Interventions DME instruction;Gait training;Patient/family education;Stair training;Wheelchair mobility training;Functional mobility training;Manual techniques;Modalities;Therapeutic activities;Therapeutic exercise;Balance training;Neuromuscular re-education  PT Recommendation  Follow Up Recommendations SNF  PT equipment None recommended by PT  Individuals Consulted  Consulted and Agree with Results and Recommendations Patient  Acute Rehab PT Goals  PT Goal Formulation With patient  Time For Goal Achievement 04/05/13  Potential to Achieve Goals Good  Pt will go Supine/Side to Sit with supervision  Pt will Transfer Bed to Chair/Chair to Bed with supervision (with RW)  PT Transfer Goal: Bed to Chair/Chair to Bed - Progress Goal set today  Pt  will Stand with supervision;3 - 5 min;with unilateral upper extremity support  PT Goal: Stand - Progress Goal set today  Pt will Ambulate 1 - 15 feet;with min assist;with rolling walker  PT Goal: Ambulate - Progress Goal set today  PT General Charges  $$ ACUTE PT VISIT 1 Procedure  PT Evaluation  $Initial PT Evaluation Tier I 1 Procedure  PT Treatments  $Therapeutic Activity 8-22 mins  Written Expression  Dominant Hand Right    Pain: pt denies pain  Lewis Shock, PT, DPT Pager #: [phone] Office #: [phone]

## 2013-03-22 NOTE — Consult Note (Addendum)
PULMONARY  / CRITICAL CARE MEDICINE  Name: Gina Ashley MRN: [id number] DOB: Apr 30, 1927    ADMISSION DATE:  03/19/2013 CONSULTATION DATE:  03/22/13  REFERRING MD :  Susie Cassette PRIMARY SERVICE:  [facility]  CHIEF COMPLAINT:  Pleural effusion   BRIEF PATIENT DESCRIPTION: 79yo female with hx CKD, Afib on coumadin, COPD, Diastolic CHF admitted 5/17 with acute on chronic diastolic CHF.  CT chest revealed mod L pleural effusion and PCCM consulted.   SIGNIFICANT EVENTS / STUDIES:  CT chest 5/18>>>1. Moderate to large right and small left pleural effusions. There is extensive collapse and/or consolidation of the right lower lobe. 2. Moderate cardiomegaly with coronary artery atherosclerosis. 3. Findings suggestive of thyroid goiter. 4. Abdominal ascites.   LINES / TUBES: none  CULTURES: 5/17 Urine >>>GNR>>>  ANTIBIOTICS: none  HISTORY OF PRESENT ILLNESS:  79yo female SNF resident with hx CKD, CHF, COPD, Afib on coumadin recent hospital admit (d/c 5/14) for CHF returned 5/16 with c/o SOB and BLE edema.  Admitted by [facility] with acute on chronic diastolic CHF but cont to have sig SOB.  CT chest revealed large R pleural effusion and PCCM consulted.  Pt c/o some residual mild SOB but feels much improved since admit.  Does have mild non productive cough.  Denies fevers, chills, hemoptysis.    PAST MEDICAL HISTORY :  Past Medical History  Diagnosis Date  . GERD (gastroesophageal reflux disease)   . Hiatal hernia   . HTN (hypertension)     all her life  . CKD (chronic kidney disease), stage III     GFR: 38  . Atrial fibrillation     since 1996  . Depression   . Chronic diastolic heart failure 08/31/2012  . Tibia fracture     BILATERAL  . Renal failure     acute on chronic   . Respiratory failure     acute on chronic hx of  requiring BIPAP  . COPD (chronic obstructive pulmonary disease)   . Metabolic encephalopathy     hx of   . Renal failure     acute on chronic with need for intermittent  dialysis - hx of   . UTI (urinary tract infection)     hx of   . Pleural effusion     right sided now resolved   . CHF (congestive heart failure)     diastolic HF  . Complication of anesthesia     "hard to wake up" (03/11/2013)  . OSA on CPAP   . Type II diabetes mellitus 1980's?  . History of blood transfusion 08/2012    "related to leg OR" (03/11/2013)  . Arthritis     "left arm; lower back, hips, hands" (03/11/2013)  . Chronic lower back pain     "disc pops in and out" (03/11/2013)  . Gout   . Anxiety   . Fall 08/2012    "in nursing home" (03/11/2013)  . Anemia   . Chronic a-fib 03/12/2013   Past Surgical History  Procedure Laterality Date  . Bunionectomy Bilateral     bilateral  . Cholecystectomy    . Shoulder arthroscopy w/ rotator cuff repair Left 2002  . Total knee arthroplasty Bilateral 2001  . Hammer toe surgery Bilateral 2004  . Colonoscopy  07/11/2011    Procedure: COLONOSCOPY;  Surgeon: Malissa Hippo, MD;  [facility];  Service: Endoscopy;  Laterality: N/A;  3:00  . Orif periprosthetic fracture  09/08/2012    Procedure: OPEN REDUCTION INTERNAL FIXATION (ORIF)  PERIPROSTHETIC FRACTURE;  Surgeon: Shelda Pal, MD;  Location: [facility];  Service: Orthopedics;  Laterality: Right;  ORIF periprosthetic right proximal femur fracture   . External fixation leg  09/08/2012    Procedure: EXTERNAL FIXATION LEG;  Surgeon: Shelda Pal, MD;  Location: [facility];  Service: Orthopedics;  Laterality: Left;  . Joint replacement    . External fixation leg  11/30/2012    Procedure: EXTERNAL FIXATION LEG;  Surgeon: Shelda Pal, MD;  Location: [facility];  Service: Orthopedics;  Laterality: Left;  Removal of External Fixation Left Knee with Evaluation with Floroscopy  . Tee without cardioversion N/A 01/19/2013    Procedure: TRANSESOPHAGEAL ECHOCARDIOGRAM (TEE);  Surgeon: Lewayne Bunting, MD;  Location: [facility] ENDOSCOPY;  Service: Cardiovascular;  Laterality: N/A;  . Shoulder open rotator cuff  repair Left 2002    "maybe 3 months after scope" (03/11/2013)  . Femur fracture surgery Right 08/2012    "fell at nursing home" (03/11/2013)  . Tibia fracture surgery Left 08/2012    "fell at nursing home" (03/11/2013)   Prior to Admission medications   Medication Sig Start Date End Date Taking? Authorizing Provider  acetaminophen (TYLENOL) 325 MG tablet Take 650 mg by mouth every 6 (six) hours as needed for pain.   Yes Historical Provider, MD  albuterol (PROVENTIL) (2.5 MG/3ML) 0.083% nebulizer solution Take 3 mLs (2.5 mg total) by nebulization every 2 (two) hours as needed for wheezing or shortness of breath. 02/12/13  Yes Shanker Levora Dredge, MD  allopurinol (ZYLOPRIM) 100 MG tablet Take 100 mg by mouth daily.   Yes Historical Provider, MD  ALPRAZolam (XANAX) 0.25 MG tablet Take 0.25 mg by mouth every 8 (eight) hours as needed for anxiety.   Yes Historical Provider, MD  amiodarone (PACERONE) 200 MG tablet Take 400 mg by mouth daily.   Yes Historical Provider, MD  busPIRone (BUSPAR) 5 MG tablet Take 5 mg by mouth every 12 (twelve) hours.   Yes Historical Provider, MD  carvedilol (COREG) 25 MG tablet Take 25 mg by mouth 2 (two) times daily with a meal.   Yes Historical Provider, MD  diphenhydrAMINE (BENADRYL) 25 mg capsule Take 25 mg by mouth every 8 (eight) hours as needed for itching.   Yes Historical Provider, MD  docusate sodium (COLACE) 100 MG capsule Take 100 mg by mouth every 8 (eight) hours.    Yes Historical Provider, MD  furosemide (LASIX) 40 MG tablet Take 40 mg by mouth daily.   Yes Historical Provider, MD  guaiFENesin (MUCINEX) 600 MG 12 hr tablet Take 600 mg by mouth 2 (two) times daily.    Yes Historical Provider, MD  insulin aspart (NOVOLOG) 100 UNIT/ML injection Inject 3 Units into the skin 3 (three) times daily with meals. If cbg is greater than 150 09/15/12  Yes Lesle Chris Black, NP  insulin glargine (LANTUS) 100 UNIT/ML injection Inject 5 Units into the skin at bedtime.   Yes Historical  Provider, MD  isosorbide-hydrALAZINE (BIDIL) 20-37.5 MG per tablet Take 1 tablet by mouth 2 (two) times daily. 01/22/13  Yes Erick Blinks, MD  Multiple Vitamin (MULTIVITAMIN WITH MINERALS) TABS Take 1 tablet by mouth daily.   Yes Historical Provider, MD  nitroGLYCERIN (NITROSTAT) 0.4 MG SL tablet Place 0.4 mg under the tongue every 5 (five) minutes as needed for chest pain.   Yes Historical Provider, MD  omeprazole (PRILOSEC) 20 MG capsule Take 40 mg by mouth daily.   Yes Historical Provider, MD  ondansetron [facility]) 4  MG tablet Take 1 tablet (4 mg total) by mouth every 6 (six) hours as needed for nausea. 02/12/13  Yes Shanker Levora Dredge, MD  oxyCODONE (OXY IR/ROXICODONE) 5 MG immediate release tablet Take 5 mg by mouth every 4 (four) hours as needed for pain (breakthrough).    Yes Historical Provider, MD  OxyCODONE (OXYCONTIN) 10 mg T12A Take 1 tablet (10 mg total) by mouth every 12 (twelve) hours. 03/17/13  Yes Kimber Relic, MD  polyethylene glycol [facility] Ethelene Hal) packet Take 17 g by mouth 2 (two) times daily.    Yes Historical Provider, MD  saccharomyces boulardii (FLORASTOR) 250 MG capsule Take 1 capsule (250 mg total) by mouth 2 (two) times daily. 02/12/13  Yes Shanker Levora Dredge, MD  simethicone (MYLICON) 80 MG chewable tablet Chew 80 mg by mouth every 6 (six) hours as needed for flatulence.   Yes Historical Provider, MD  traZODone (DESYREL) 50 MG tablet Take 50 mg by mouth at bedtime.   Yes Historical Provider, MD  warfarin (COUMADIN) 3 MG tablet Take 3 mg by mouth daily.   Yes Historical Provider, MD   Allergies  Allergen Reactions  . Morphine Sulfate Other (See Comments)    REACTION: change in personality  . Remeron (Mirtazapine) Other (See Comments)    Altered mental status, lethargy  . Tuna (Fish Allergy) Other (See Comments)    unknown  . Penicillins Rash    Tolerates Ancef.    FAMILY HISTORY:  Family History  Problem Relation Age of Onset  . Colon cancer Brother     SOCIAL HISTORY:  reports that she has never smoked. She has never used smokeless tobacco. She reports that she does not drink alcohol or use illicit drugs.  REVIEW OF SYSTEMS:    Per HPI.  All other systems reviewed and were neg.   VITAL SIGNS: Temp:  [97.5 F (36.4 C)-98.2 F (36.8 C)] 98 F (36.7 C) (05/19 0417) Pulse Rate:  [82-89] 89 (05/19 0655) Resp:  [20-22] 22 (05/19 0417) BP: (103-142)/(68-91) 127/91 mmHg (05/19 0655) SpO2:  [96 %-100 %] 96 % (05/19 0933) Weight:  [207 lb 3.7 oz (94 kg)] 207 lb 3.7 oz (94 kg) (05/19 0417)  PHYSICAL EXAMINATION: General:  Chronically ill appearing female, NAD OOB in chair  Neuro:  Awake, alert, pleasant, appropriate, MAE, gen weakness HEENT:  Mm moist, mild JVD Cardiovascular:  s1s2 irreg Lungs:  resps even non labored on 2L [place], bibasilar wet crackles, slightly diminished r base, faint exp wheeze Abdomen:  Soft, +bs Ext: warm and dry, BLE braces, scant BLE edema    Recent Labs Lab 03/19/13 2123 03/21/13 0500 03/22/13 0036  NA 131* 134* 134*  K 4.7 3.9 3.4*  CL 89* 90* 88*  CO2 34* 39* 39*  BUN 68* 68* 64*  CREATININE 2.84* 2.75* 2.52*  GLUCOSE 202* 144* 238*    Recent Labs Lab 03/19/13 2123 03/22/13 0036  HGB 9.6* 8.9*  HCT 30.5* 27.3*  WBC 5.9 5.0  PLT 104* 104*   Ct Chest Wo Contrast  03/21/2013   *RADIOLOGY REPORT*  Clinical Data: Shortness of breath  CT CHEST WITHOUT CONTRAST  Technique:  Multidetector CT imaging of the chest was performed following the standard protocol without IV contrast.  Comparison: CT abdomen pelvis 05/22/2012  Findings: The thyroid gland is enlarged, left lobe greater than right. There are several calcifications and multiple nodules within the left thyroid lobe.  One of the calcifications is very dense with calcified and measures 14 mm.  There is some sternal extension of the left lobe the thyroid gland.  Findings are most consistent with thyroid goiter.  There is a moderate to large right  pleural effusion and a small left pleural effusion.  These effusions appear simple.  There is moderate cardiomegaly, with dilatation of both atria.  Coronary artery calcifications are present.  There is significant collapse / consolidation of the right lower lobe, overlying the pleural effusion.  There is scattered areas of atelectasis bilaterally.  There are scattered nonpathologically enlarged mediastinal lymph nodes. Sensitivity for the evaluation for hilar lymphadenopathy is decreased without intravenous contrast.  The thoracic aorta is normal in caliber.  There are extensive degenerative changes of the thoracic spine. Thoracic spine vertebral bodies are normal in height and alignment.  Imaging of the upper abdomen demonstrates a moderate degree of abdominal ascites, most prominent adjacent to the liver.  IMPRESSION:  1.  Moderate to large right and small left pleural effusions. There is extensive collapse and/or consolidation of the right lower lobe. 2. Moderate cardiomegaly with coronary artery atherosclerosis. 3.  Findings suggestive of thyroid goiter. 4.  Abdominal ascites.   Original Report Authenticated By: Britta Mccreedy, M.D.    ASSESSMENT / PLAN:  Pleural effusion  COPD  Atelectasis/collapse  Acute on chronic diastolic CHF  Acute on chronic renal failure  Afib on coumadin   PLAN -  -Cont aggressive diuresis as Scr tol  -F/u CXR  -Consider hold coumadin today to prep for potential thora with INR>3 -Will add BD -pulmonary hygiene   Seems much improved clinically from resp standpoint - may be able to hold off on thora.  May want to go ahead and hold coumadin, start heparin to allow INR to trend down for thora.  Will check f/u CXR after yesterday's diuresis and make determination on thora plans.  May even be able to send back to SNF on lovenox with coumadin held and have thora done as outpt in next few days if she cont to improve clinically.    Danford Bad, NP 03/22/2013  10:10  AM Pager: [phone] or [phone]  *Care during the described time interval was provided by me and/or other providers on the critical care team. I have reviewed this patient's available data, including medical history, events of note, physical examination and test results as part of my evaluation.  Patient seen and examined, the patient displays no sign of infection at this time.  She also has little to suggest signs of cancer or other exudative processes to justify a thoracentesis.  More importantly however, the patient shows no signs of respiratory distress.  A thora would not be in the patient's best interest unless she manifests signs of respiratory failure.  The benefit would not justify the risk.  Diureses as needed and if patient develops signs of respiratory failure then would reconsider thora.  Would not stop coumadin and start heparin for now as we will not be performing a thoracentesis at this time.  Having said that, if diureses becomes less effective and symptoms develop then will reconsider.  Continue treatment of CHF medically.  With regards to COPD would recommend continuation of current treatment.  OSA by history, continue CPAP.  Pulmonary hygienes and sputum clearance as indicated.  Patient seen and examined, agree with above note.  I dictated the care and orders written for this patient under my direction.  Alyson Reedy, MD [phone]

## 2013-03-22 NOTE — Progress Notes (Signed)
Thank you for consulting the Palliative Medicine Team at [facility] to meet your patient's and family's needs.   The reason that you asked [place] to see your patient is  For Goals of Care  We have scheduled your patient for a meeting: 03/23/13@ 11:30a  The Surrogate decision make [id number] Mariann Laster information:Cell: [phone]                                   Home: [phone]  Other family members that need to be present: none  Your patient is able/unable to participate: able to participate-  Additional Narrative: Daughter given PMT phone number if need to cancel or reschedule  Freddie Breech, CNS-C Palliative Medicine Team [facility] Team Phone: [phone] Pager: [phone]

## 2013-03-23 ENCOUNTER — Inpatient Hospital Stay (HOSPITAL_COMMUNITY): Payer: Medicare Other

## 2013-03-23 DIAGNOSIS — F411 Generalized anxiety disorder: Secondary | ICD-10-CM

## 2013-03-23 DIAGNOSIS — Z515 Encounter for palliative care: Secondary | ICD-10-CM

## 2013-03-23 DIAGNOSIS — M25559 Pain in unspecified hip: Secondary | ICD-10-CM

## 2013-03-23 DIAGNOSIS — M549 Dorsalgia, unspecified: Secondary | ICD-10-CM

## 2013-03-23 LAB — LACTATE DEHYDROGENASE: LDH: 191 U/L (ref 94–250)

## 2013-03-23 LAB — BODY FLUID CELL COUNT WITH DIFFERENTIAL
Eos, Fluid: 0 %
Monocyte-Macrophage-Serous Fluid: 42 % — ABNORMAL LOW (ref 50–90)
Neutrophil Count, Fluid: 19 % (ref 0–25)
Total Nucleated Cell Count, Fluid: 149 cu mm (ref 0–1000)

## 2013-03-23 LAB — GLUCOSE, CAPILLARY: Glucose-Capillary: 176 mg/dL — ABNORMAL HIGH (ref 70–99)

## 2013-03-23 LAB — TYPE AND SCREEN
Antibody Screen: POSITIVE
DAT, IgG: NEGATIVE
PT AG Type: NEGATIVE

## 2013-03-23 LAB — URINE CULTURE: Colony Count: >=100000

## 2013-03-23 LAB — LACTATE DEHYDROGENASE, PLEURAL OR PERITONEAL FLUID: LD, Fluid: 73 U/L — ABNORMAL HIGH (ref 3–23)

## 2013-03-23 LAB — BASIC METABOLIC PANEL
Calcium: 9.9 mg/dL (ref 8.4–10.5)
Creatinine, Ser: 2.11 mg/dL — ABNORMAL HIGH (ref 0.50–1.10)
GFR calc Af Amer: 24 mL/min — ABNORMAL LOW (ref >90)

## 2013-03-23 LAB — CHOLESTEROL, TOTAL: Cholesterol: 115 mg/dL (ref 0–200)

## 2013-03-23 LAB — PROTIME-INR: Prothrombin Time: 23.4 seconds — ABNORMAL HIGH (ref 11.6–15.2)

## 2013-03-23 MED ORDER — LEVALBUTEROL HCL 0.63 MG/3ML IN NEBU
0.6300 mg | INHALATION_SOLUTION | Freq: Three times a day (TID) | RESPIRATORY_TRACT | Status: DC
  Administered 2013-03-23 – 2013-03-27 (×13): 0.63 mg via RESPIRATORY_TRACT
  Filled 2013-03-23 (×27): qty 3

## 2013-03-23 MED ORDER — TRAZODONE HCL 50 MG PO TABS
50.0000 mg | ORAL_TABLET | Freq: Every day | ORAL | Status: DC
  Administered 2013-03-24 – 2013-03-29 (×7): 50 mg via ORAL
  Filled 2013-03-23 (×8): qty 1

## 2013-03-23 MED ORDER — IPRATROPIUM BROMIDE 0.02 % IN SOLN
0.5000 mg | Freq: Three times a day (TID) | RESPIRATORY_TRACT | Status: DC
  Administered 2013-03-23 – 2013-03-27 (×13): 0.5 mg via RESPIRATORY_TRACT
  Filled 2013-03-23 (×13): qty 2.5

## 2013-03-23 MED ORDER — INSULIN ASPART 100 UNIT/ML ~~LOC~~ SOLN
0.0000 [IU] | Freq: Three times a day (TID) | SUBCUTANEOUS | Status: DC
  Administered 2013-03-23: 2 [IU] via SUBCUTANEOUS
  Administered 2013-03-23 – 2013-03-24 (×2): 3 [IU] via SUBCUTANEOUS
  Administered 2013-03-24 – 2013-03-25 (×3): 2 [IU] via SUBCUTANEOUS
  Administered 2013-03-25: 1 [IU] via SUBCUTANEOUS
  Administered 2013-03-25: 3 [IU] via SUBCUTANEOUS
  Administered 2013-03-26 (×2): 2 [IU] via SUBCUTANEOUS
  Administered 2013-03-26: 3 [IU] via SUBCUTANEOUS
  Administered 2013-03-27 (×3): 2 [IU] via SUBCUTANEOUS
  Administered 2013-03-28: 1 [IU] via SUBCUTANEOUS
  Administered 2013-03-28 – 2013-03-29 (×4): 2 [IU] via SUBCUTANEOUS
  Administered 2013-03-29: 3 [IU] via SUBCUTANEOUS

## 2013-03-23 MED ORDER — DOCUSATE SODIUM 100 MG PO CAPS
100.0000 mg | ORAL_CAPSULE | Freq: Two times a day (BID) | ORAL | Status: DC
  Administered 2013-03-23 – 2013-03-30 (×15): 100 mg via ORAL
  Filled 2013-03-23 (×16): qty 1

## 2013-03-23 MED ORDER — LIDOCAINE HCL (PF) 1 % IJ SOLN
INTRAMUSCULAR | Status: AC
  Administered 2013-03-23: 16:00:00
  Filled 2013-03-23: qty 5

## 2013-03-23 MED ORDER — POTASSIUM CHLORIDE CRYS ER 20 MEQ PO TBCR
40.0000 meq | EXTENDED_RELEASE_TABLET | Freq: Two times a day (BID) | ORAL | Status: DC
  Administered 2013-03-23 – 2013-03-26 (×8): 40 meq via ORAL
  Filled 2013-03-23 (×10): qty 2
  Filled 2013-03-23: qty 1

## 2013-03-23 NOTE — Progress Notes (Signed)
03/23/13 1140 Noted CM referral for Heart Failure home health screen, however, pt. from SNF and will return there upon dc.  Lupita Leash, CSW aware of  pt. Today pt. to have thoracentisis, and is currently on a Lasix gtt. Tera Mater, RN, BSN NCM [phone]

## 2013-03-23 NOTE — Progress Notes (Signed)
No change in d/c plan.  Will dc to [facility] when medically stable. CSW met with patient and her 2 daughters today. Patient had a sleep study 2 weeks ago and has been on a BiPap at the nursing center.  CSW contacted Tammy Emelia Loron- RN Liason for [employer]. She will order a BIPAP for patient's use at the nursing center but will need to know the settings. Her daughter will make attempts to obtain results of sleep study.  Will discuss with MD.  Patient underwent a Thorencentesis today. She is not medically ready for d/c at this time.  CSW to monitor.  Lorri Frederick. [name]  [phone]

## 2013-03-23 NOTE — Progress Notes (Signed)
PULMONARY  / CRITICAL CARE MEDICINE  Name: Gina Ashley MRN: [id number] DOB: Mar 18, 1927    ADMISSION DATE:  03/19/2013 CONSULTATION DATE:  03/22/13  REFERRING MD :  Susie Cassette PRIMARY SERVICE:  [facility]  CHIEF COMPLAINT:  Pleural effusion   BRIEF PATIENT DESCRIPTION: 79yo female with hx CKD, Afib on coumadin, COPD, Diastolic CHF admitted 5/17 with acute on chronic diastolic CHF.  CT chest revealed mod L pleural effusion and PCCM consulted.   SIGNIFICANT EVENTS / STUDIES:  CT chest 5/18>>>1. Moderate to large right and small left pleural effusions. There is extensive collapse and/or consolidation of the right lower lobe. 2. Moderate cardiomegaly with coronary artery atherosclerosis. 3. Findings suggestive of thyroid goiter. 4. Abdominal ascites.  VITAL SIGNS: Temp:  [97.7 F (36.5 C)-98.5 F (36.9 C)] 98.1 F (36.7 C) (05/20 1131) Pulse Rate:  [69-93] 85 (05/20 1131) Resp:  [18-20] 20 (05/20 1131) BP: (108-154)/(50-85) 115/64 mmHg (05/20 1131) SpO2:  [95 %-100 %] 100 % (05/20 0813) Weight:  [94.212 kg (207 lb 11.2 oz)] 94.212 kg (207 lb 11.2 oz) (05/20 0641) 2 liters  PHYSICAL EXAMINATION: General:  Chronically ill appearing female, NAD OOB in chair  Neuro:  Awake, alert, pleasant, appropriate, MAE, gen weakness HEENT:  Mm moist, mild JVD Cardiovascular:  s1s2 irreg Lungs:  resps even non labored on 2L [place], decreased on right Abdomen:  Soft, +bs Ext: warm and dry, BLE braces, scant BLE edema    Recent Labs Lab 03/21/13 0500 03/22/13 0036 03/23/13 0530  NA 134* 134* 140  K 3.9 3.4* 3.7  CL 90* 88* 91*  CO2 39* 39* 42*  BUN 68* 64* 57*  CREATININE 2.75* 2.52* 2.11*  GLUCOSE 144* 238* 195*    Recent Labs Lab 03/19/13 2123 03/22/13 0036  HGB 9.6* 8.9*  HCT 30.5* 27.3*  WBC 5.9 5.0  PLT 104* 104*   Dg Chest 2 View  03/22/2013   *RADIOLOGY REPORT*  Clinical Data: Shortness of breath, follow up of effusion  CHEST - 2 VIEW  Comparison: CT chest of 03/21/2013 and chest  x-ray of 03/19/2013  Findings: Opacity remains throughout much of the right lung base most consistent with a moderate to large right pleural effusion with atelectasis of the right lower lobe.  A small left pleural effusion remains.  Cardiomegaly is stable and there does appear to be mild pulmonary vascular congestion present.  Deviation of the tracheal air shadow to the right of midline is most consistent with thyroid goiter with a focus of calcification noted in the lower left neck.  IMPRESSION:  1.  Little change to some increase in volume of the right pleural effusion with right basilar atelectasis. 2.  Tiny left pleural effusion. 3.  Cardiomegaly and pulmonary vascular congestion. 4.  Probable thyroid goiter.  Correlate clinically.   Original Report Authenticated By: Dwyane Dee, M.D.   Ct Chest Wo Contrast  03/21/2013   *RADIOLOGY REPORT*  Clinical Data: Shortness of breath  CT CHEST WITHOUT CONTRAST  Technique:  Multidetector CT imaging of the chest was performed following the standard protocol without IV contrast.  Comparison: CT abdomen pelvis 05/22/2012  Findings: The thyroid gland is enlarged, left lobe greater than right. There are several calcifications and multiple nodules within the left thyroid lobe.  One of the calcifications is very dense with calcified and measures 14 mm.  There is some sternal extension of the left lobe the thyroid gland.  Findings are most consistent with thyroid goiter.  There is a moderate to large right  pleural effusion and a small left pleural effusion.  These effusions appear simple.  There is moderate cardiomegaly, with dilatation of both atria.  Coronary artery calcifications are present.  There is significant collapse / consolidation of the right lower lobe, overlying the pleural effusion.  There is scattered areas of atelectasis bilaterally.  There are scattered nonpathologically enlarged mediastinal lymph nodes. Sensitivity for the evaluation for hilar lymphadenopathy  is decreased without intravenous contrast.  The thoracic aorta is normal in caliber.  There are extensive degenerative changes of the thoracic spine. Thoracic spine vertebral bodies are normal in height and alignment.  Imaging of the upper abdomen demonstrates a moderate degree of abdominal ascites, most prominent adjacent to the liver.  IMPRESSION:  1.  Moderate to large right and small left pleural effusions. There is extensive collapse and/or consolidation of the right lower lobe. 2. Moderate cardiomegaly with coronary artery atherosclerosis. 3.  Findings suggestive of thyroid goiter. 4.  Abdominal ascites.   Original Report Authenticated By: Britta Mccreedy, M.D.    ASSESSMENT / PLAN: Dyspnea in setting of: Decompensated Acute on chronic diastolic CHF, Acute on chronic renal failure  volume overload,  Pleural effusion and c/b underlying COPD and Atelectasis/collapse  Still some dyspnea. Effusion probably >750 ml (estimate by bedside US).  PLAN -  -Cont aggressive diuresis as Scr tol  -FFP now then go ahead w/ therapeutic thora. -additional rx per heart failure team    Afib on coumadin  - Per cardiology plans  Murrell Converse, MD, PhD 03/23/2013, 2:53 PM [facility] [phone] or if no answer [phone]

## 2013-03-23 NOTE — Clinical Documentation Improvement (Signed)
GENERIC DOCUMENTATION CLARIFICATION QUERY  CLINICAL DOCUMENTATION QUERIES ARE NOT PART OF THE PERMANENT MEDICAL RECORD  Please update your documentation within the medical record to reflect your response to this query.                                                                                         03/23/13   Dr. Gala Romney,  In a better effort to capture your patient's severity of illness, reflect appropriate length of stay and utilization of resources, a review of the patient medical record has revealed the following information:    - Patient admitted with Acute on Chronic Diastolic Heart Failure   - Known history of Hypertension   - Moderate Concentric Left Ventricular Hypertrophy per Echo 01/04/13   - Cardiomegaly/Cardiac Enlargement noted on Chest Imaging studies    Based on your clinical judgment, please document in the progress notes and discharge summary if a condition below provides greater specificity regarding the patient's hypertension:    - Hypertensive Heart Disease   - Other Condition   - Unable to Clinically Determine    In responding to this query please exercise your independent judgment.    The fact that a query is asked, does not imply that any particular answer is desired or expected.   Reviewed: additional documentation in the medical record  - added to problem list   Thank You,  Jerral Ralph  RN BSN CCDS Certified Clinical Documentation Specialist: Cell   [phone]  Health Information Management [place]  TO RESPOND TO THE THIS QUERY, FOLLOW THE INSTRUCTIONS BELOW:  1. If needed, update documentation for the patient's encounter via the notes activity.  2. Access this query again and click edit on the In [employer].  3. After updating, or not, click F2 to complete all highlighted (required) fields concerning your review. Select "additional documentation in the medical record" OR "no additional documentation provided".   4. Click Sign note button.  5. The deficiency will fall out of your In Basket *Please let us know if you are not able to complete this workflow by phone or e-mail (listed below).

## 2013-03-23 NOTE — Consult Note (Addendum)
Patient's urine culture continues to grow extremely resistant Klebsiella pneumoniae (Carbapenamase Producer).  Strict Contact Precautions with scrupulous Hand Hygiene should be practiced with this patient to assure the organism is not spread to others.  Patient will REQUIRE Contact Precaution with [facility] Admission. Jorene Minors, RN, CIC Infection Prevention Specialist

## 2013-03-23 NOTE — Procedures (Signed)
Thoracentesis Procedure Note  Pre-operative Diagnosis: pleural effusion  Post-operative Diagnosis: same  Indications: dyspnea   Procedure Details  Consent: Informed consent was obtained. Risks of the procedure were discussed including: infection, bleeding, pain, pneumothorax.  Under sterile conditions the patient was positioned. Betadine solution and sterile drapes were utilized.  1% buffered lidocaine was used to anesthetize the pleural  Space which was identified by [place]. Fluid was obtained without any difficulties and minimal blood loss.  A dressing was applied to the wound and wound care instructions were provided.   Findings 1100 ml of clear concentrated yellow pleural fluid was obtained. A sample was sent to Pathology for cytogenetics, flow, and cell counts, as well as for infection analysis.  Complications:  None; patient tolerated the procedure well.          Condition: stable  Plan A follow up chest x-ray was ordered. Bed Rest for 0 hours. Tylenol 650 mg. for pain.  Attending Attestation: I was present and scrubbed for the entire procedure.  Levy Pupa, MD, PhD 03/24/2013, 1:12 PM [facility] [phone] or if no answer [phone]

## 2013-03-23 NOTE — Progress Notes (Signed)
ANTICOAGULATION CONSULT NOTE - Follow Up Consult  Pharmacy Consult for coumadin Indication: atrial fibrillation  Allergies  Allergen Reactions  . Morphine Sulfate Other (See Comments)    REACTION: change in personality  . Remeron (Mirtazapine) Other (See Comments)    Altered mental status, lethargy  . Tuna (Fish Allergy) Other (See Comments)    unknown  . Penicillins Rash    Tolerates Ancef.    Patient Measurements: Height: 5\' 3"  (160 cm) Weight: 207 lb 11.2 oz (94.212 kg) IBW/kg (Calculated) : 52.4  Vital Signs: Temp: 97.7 F (36.5 C) (05/20 1458) Temp src: Oral (05/20 1458) BP: 115/64 mmHg (05/20 1458) Pulse Rate: 89 (05/20 1458)  Labs:  Recent Labs  03/21/13 0500 03/21/13 1321 03/21/13 1840 03/22/13 0036 03/23/13 0530  HGB  --   --   --  8.9*  --   HCT  --   --   --  27.3*  --   PLT  --   --   --  104*  --   LABPROT 36.0*  --   --  30.0* 23.4*  INR 3.91*  --   --  3.06* 2.19*  CREATININE 2.75*  --   --  2.52* 2.11*  TROPONINI  --  <0.30 <0.30 <0.30  --     Estimated Creatinine Clearance: 22 ml/min (by C-G formula based on Cr of 2.11).  Assessment: 79 yo female admitted with SOB and leg swelling noted with HF . Pharmacy consulted to manage Coumadin. INR today is 2.1, no bleeding noted. Coumadin currently on hold for procedure this afternoon.   Home coumadin dose: 3mg /day  Goal of Therapy:  INR 2-3 Monitor platelets by anticoagulation protocol: Yes   Plan:  Hold coumadin for thoracentesis INR in am   Sheppard Coil PharmD., BCPS Clinical Pharmacist Pager [phone] 03/23/2013 3:09 PM

## 2013-03-23 NOTE — Progress Notes (Addendum)
Advanced Heart Failure Rounding Note   Subjective:    Gina Ashley is an 79 y.o. female with history of chronic atrial fibrillation on coumadin, diastolic heart failure and HTN. She also has DM type 2, COPD, OSA on CPAP, A Fib- chronic coumadin, and CKD failure with baseline Cr 2.2. She has had 5 hospitalizations in the last 6 months, 1 included tibia/fibula fracture, 1 for UTI, and the rest have been due to massive fluid overload in the setting of diastolic heart failure. Per nephrology she is not a candidate for HD.   Admitted March 2014 with massive volume overload. Weight 280 pounds. She required Milrinone and Dopamine. Permanent atrial fib and went for TEE/DCCV on 3/18 but unable to complete due to smoke. Continued on amio for rate control and coumadin. Evaluated by Nephrology due to renal failure and she was not a candidate for HD. Discharged to SNF. Discharge weight 212 pounds.   Discharged from [facility] 03/17/13 after being diuresed with Lasix drip and later transitioned to 60 mg of Torsemide daily. She was discharged back to SNF. Discharge weight 217 pounds.   She presented to [facility] ED 03/20/13 with increased dyspnea and lower extremity edema. Her weight was up a few pounds to 220. She was given 80 mg IV lasix and Metolazone 2.5 mg daily. CT with large R pleural effusion with associate RLL collapse.  CCM to plan thoracentesis today, INR now 2.19. Weight remains stable at 207 lbs, -2L out. Continues to complain of difficulty taking a deep breath. Denies SOB, orthopnea or CP. Palliative care to meet with family today. Cr down slightly this am, 2.11. +orthopnea and SOB. Denies CP.   Objective:   Weight Range:  Vital Signs:   Temp:  [97.7 F (36.5 C)-98.5 F (36.9 C)] 98.1 F (36.7 C) (05/20 1131) Pulse Rate:  [69-93] 85 (05/20 1131) Resp:  [18-20] 20 (05/20 1131) BP: (108-154)/(50-85) 115/64 mmHg (05/20 1131) SpO2:  [95 %-100 %] 100 % (05/20 0813) Weight:  [94.212 kg (207 lb 11.2 oz)] 94.212  kg (207 lb 11.2 oz) (05/20 0641) Last BM Date: 03/22/13  Weight change: Filed Weights   03/22/13 0417 03/23/13 0500 03/23/13 0641  Weight: 94 kg (207 lb 3.7 oz) 94.212 kg (207 lb 11.2 oz) 94.212 kg (207 lb 11.2 oz)    Intake/Output:   Intake/Output Summary (Last 24 hours) at 03/23/13 1243 Last data filed at 03/23/13 1100  Gross per 24 hour  Intake 1231.5 ml  Output   2750 ml  Net -1518.5 ml     Physical Exam: General:  Elderly chronically appearing. No resp difficulty HEENT: normal Neck: supple. JVP 8-9 . Carotids 2+ bilat; no bruits. No lymphadenopathy or thryomegaly appreciated. Cor: PMI nondisplaced. Regular rate & irregular rhythm. No rubs, gallops or murmurs. Lungs: clear Abdomen: soft, nontender, mild distention. No hepatosplenomegaly. No bruits or masses. Good bowel sounds. Extremities: no cyanosis, clubbing, rash, 1+ edema Neuro: alert & orientedx3, cranial nerves grossly intact. moves all 4 extremities w/o difficulty. Affect pleasant  Telemetry: AFib 80-90s  Labs: Basic Metabolic Panel:  Recent Labs Lab 03/17/13 0525 03/19/13 2123 03/21/13 0500 03/22/13 0036 03/23/13 0530  NA 135 131* 134* 134* 140  K 4.3 4.7 3.9 3.4* 3.7  CL 90* 89* 90* 88* 91*  CO2 41* 34* 39* 39* 42*  GLUCOSE 118* 202* 144* 238* 195*  BUN 64* 68* 68* 64* 57*  CREATININE 2.63* 2.84* 2.75* 2.52* 2.11*  CALCIUM 9.7 9.7 9.6 9.3 9.9    Liver Function Tests:  Recent Labs Lab 03/19/13 2123  AST 18  ALT 10  ALKPHOS 109  BILITOT 0.6  PROT 7.7  ALBUMIN 4.1    Recent Labs Lab 03/19/13 2123  LIPASE 20   No results found for this basename: AMMONIA,  in the last 168 hours  CBC:  Recent Labs Lab 03/19/13 2123 03/22/13 0036  WBC 5.9 5.0  NEUTROABS 4.5  --   HGB 9.6* 8.9*  HCT 30.5* 27.3*  MCV 86.6 86.9  PLT 104* 104*    Cardiac Enzymes:  Recent Labs Lab 03/21/13 1321 03/21/13 1840 03/22/13 0036  TROPONINI <0.30 <0.30 <0.30    BNP: BNP (last 3  results)  Recent Labs  03/10/13 2117 03/13/13 0747 03/19/13 2123  PROBNP 4061.0* 4669.0* 4611.0*     Other results:    Imaging: Dg Chest 2 View  03/22/2013   *RADIOLOGY REPORT*  Clinical Data: Shortness of breath, follow up of effusion  CHEST - 2 VIEW  Comparison: CT chest of 03/21/2013 and chest x-ray of 03/19/2013  Findings: Opacity remains throughout much of the right lung base most consistent with a moderate to large right pleural effusion with atelectasis of the right lower lobe.  A small left pleural effusion remains.  Cardiomegaly is stable and there does appear to be mild pulmonary vascular congestion present.  Deviation of the tracheal air shadow to the right of midline is most consistent with thyroid goiter with a focus of calcification noted in the lower left neck.  IMPRESSION:  1.  Little change to some increase in volume of the right pleural effusion with right basilar atelectasis. 2.  Tiny left pleural effusion. 3.  Cardiomegaly and pulmonary vascular congestion. 4.  Probable thyroid goiter.  Correlate clinically.   Original Report Authenticated By: Dwyane Dee, M.D.   Ct Chest Wo Contrast  03/21/2013   *RADIOLOGY REPORT*  Clinical Data: Shortness of breath  CT CHEST WITHOUT CONTRAST  Technique:  Multidetector CT imaging of the chest was performed following the standard protocol without IV contrast.  Comparison: CT abdomen pelvis 05/22/2012  Findings: The thyroid gland is enlarged, left lobe greater than right. There are several calcifications and multiple nodules within the left thyroid lobe.  One of the calcifications is very dense with calcified and measures 14 mm.  There is some sternal extension of the left lobe the thyroid gland.  Findings are most consistent with thyroid goiter.  There is a moderate to large right pleural effusion and a small left pleural effusion.  These effusions appear simple.  There is moderate cardiomegaly, with dilatation of both atria.  Coronary artery  calcifications are present.  There is significant collapse / consolidation of the right lower lobe, overlying the pleural effusion.  There is scattered areas of atelectasis bilaterally.  There are scattered nonpathologically enlarged mediastinal lymph nodes. Sensitivity for the evaluation for hilar lymphadenopathy is decreased without intravenous contrast.  The thoracic aorta is normal in caliber.  There are extensive degenerative changes of the thoracic spine. Thoracic spine vertebral bodies are normal in height and alignment.  Imaging of the upper abdomen demonstrates a moderate degree of abdominal ascites, most prominent adjacent to the liver.  IMPRESSION:  1.  Moderate to large right and small left pleural effusions. There is extensive collapse and/or consolidation of the right lower lobe. 2. Moderate cardiomegaly with coronary artery atherosclerosis. 3.  Findings suggestive of thyroid goiter. 4.  Abdominal ascites.   Original Report Authenticated By: Britta Mccreedy, M.D.     Medications:  Scheduled Medications: . allopurinol  100 mg Oral Daily  . amiodarone  400 mg Oral Daily  . busPIRone  5 mg Oral Q12H  . carvedilol  25 mg Oral BID WC  . docusate sodium  100 mg Oral BID  . guaiFENesin  600 mg Oral BID  . insulin aspart  0-9 Units Subcutaneous TID WC  . ipratropium  0.5 mg Nebulization TID  . isosorbide-hydrALAZINE  1 tablet Oral BID  . levalbuterol  0.63 mg Nebulization TID  . OxyCODONE  10 mg Oral Q12H  . pantoprazole  40 mg Oral Daily  . polyethylene glycol  17 g Oral BID  . potassium chloride  40 mEq Oral BID  . sodium chloride  3 mL Intravenous Q12H    Infusions: . furosemide (LASIX) infusion 15 mg/hr (03/23/13 1100)    PRN Medications: sodium chloride, acetaminophen, ALPRAZolam, menthol-cetylpyridinium, metoCLOPramide (REGLAN) injection, nitroGLYCERIN, ondansetron (ZOFRAN) IV, oxyCODONE, phenol, simethicone, sodium chloride   Assessment:   1. Acute on chronic diastolic  HF  2. R Pleural effusion, large  3.Acute on chronic renal failure. (Creatinine baseline 2.6)  4. h/o HTN with hypertensive heart disease  5. Obesity, suspect OHS/OSA  6. Chronic atrial fib  - on coumadin  7. Deconditioning  8. DNR  9. Cardiorenal syndrome, severe   Plan/Discussion:    Patient stable overnight. Continues to diuresis well on lasix and Cr trending down. Will continue lasix gtt today to try and get as dry as possible. Will continue to follow Cr closely. Patient getting FFP this am to get thoracentesis today by CCM. Very difficult situation seeing that patient is not a HD candidate and she is having greater difficulty with her HF. Palliative care team to meet with patient and daughter at 11:30 to discuss goals of care.    Agree with d/c to [facility] on discharge.    Length of Stay: 4   Arvilla Meres 03/23/2013, 12:43 PM  Advanced Heart Failure Team Pager [phone] (M-F; 7a - 4p)  Please contact [facility] for night-coverage after hours (4p -7a ) and weekends on [email or web address]

## 2013-03-23 NOTE — Progress Notes (Signed)
CSW met with patient and her 2 daughters this afternoon to discuss d/c planning needs. Patient is adamant that she not return to [place]. She is requesting placement at [facility].  Per patient's permission, CSW referred patient to Reyne Dumas- RN Liason for [facility]. She spoke with patient and daughters;  Referral completed and bed offer in place.  Daughters will close out her room at [place].  Gina Ashley. Gina Ashley  [phone]

## 2013-03-23 NOTE — Progress Notes (Signed)
[facility] PROGRESS NOTE  Jontae Sonier ZOX:[id number] DOB: [birth year] DOA: 03/19/2013 PCP: Kimber Relic, MD  Assessment/Plan: Principal Problem:   Acute on chronic diastolic heart failure Active Problems:   DM   HYPERTENSION   COPD (chronic obstructive pulmonary disease)   CKD (chronic kidney disease)   Shortness of breath   Chronic a-fib   Warfarin-induced coagulopathy   Hypoxemia   Pleural effusion    1.Acute on ChronicDiastolic CHF- CHF protocol and Diurese with IV lasix given 1 dose of Metalozone in Ed , started on Lasix drip by cardiology on 5/19, she has had multiple hospitalizations and creatinine is susceptible to worsening with increase in her diuretics. Also during this hospitalization her creatinine has been improving.. Cardiology recommends palliative approach. Replete potassium. CT chest without contrast yesterday showed bilateral pleural effusions and extensive right lower lobe collapse. Have requested Dr. Molli Knock evaluate the patient. Heart failure team has been consulted . Trial of thoracentesis with the help of FFP?? Palliative care team has been consulted  2. SOB due to #1  3. COPD -stable.  4. DM2- continue and add SSI coverage PRN.  5. HTN- Monitor BPs, and continue carvedilol therapy and lasix Rx.  6. CKD- Monitor Bun/Cr, baseline CR= 2.4. , Creatinine steadily improving  7. Chronic Atrial Fibrillation- Contiune Coumadin Monitor Pt/INRs adjust PRN.  8. Warfarin Induced Coaguloapthy-INR down from 4.3-3.91 , now improving, Coumadin held by cardiology   Code Status: DO NOT RESUSCITATE Family Communication: family updated about patient's clinical progress Disposition Plan:  As above    Brief narrative: [name] is an 79 y.o. female with history of chronic atrial fibrillation on coumadin, diastolic heart failure and HTN. She also has DM type 2, COPD, OSA on CPAP, A Fib- chronic coumadin, and CKD failure with baseline Cr 2.2. She has had 5  hospitalizations in the last 6 months, 1 included tibia/fibula fracture, 1 for UTI, and the rest have been due to massive fluid overload in the setting of diastolic heart failure. Per nephrology she is not a candidate for HD.  Admitted March 2014 with massive volume overload. Weight 280 pounds. She required Milrinone and Dopamine. Permanent atrial fib and went for TEE/DCCV on 3/18 but unable to complete due to smoke. Continued on amio for rate control and coumadin. Evaluated by Nephrology due to renal failure and she was not a candidate for HD. Discharged to SNF. Discharge weight 212 pounds.  Discharged from [facility] 03/17/13 after being diuresed with Lasix drip and later transitioned to 60 mg of Torsemide daily. She was discharged back to SNF. Discharge weight 217 pounds.  She presented to [facility] ED 03/20/13 with increased dyspnea and lower extremity edema.Weight up to 220 She was given 80 mg IV lasix and Metolazone 2.5 mg daily. CT with large R pleural effusion with associate RLL collpase  Evaluated by CCM today for pleural effusion however thoracentesis deferred due to high INR and uncertainty to the benefit.    Consultants:  Heart failure team  Pulmonology  Palliative care  Procedures:  None  Antibiotics: None  HPI/Subjective: Continues to be short of breath  Objective: Filed Vitals:   03/22/13 2055 03/23/13 0500 03/23/13 0641 03/23/13 0813  BP: 131/71  130/70   Pulse: 90     Temp: 98 F (36.7 C)  97.7 F (36.5 C)   TempSrc: Oral  Oral   Resp: 20  18   Height:      Weight:  94.212 kg (207 lb 11.2 oz) 94.212 kg (207  lb 11.2 oz)   SpO2: 97%  95% 100%    Intake/Output Summary (Last 24 hours) at 03/23/13 0818 Last data filed at 03/23/13 0700  Gross per 24 hour  Intake  991.5 ml  Output   3026 ml  Net -2034.5 ml    Exam:  General: Elderly chronically ill appearing. No resp difficulty  HEENT: normal  Neck: supple. JVP to jaw . Carotids 2+ bilat; no bruits. No lymphadenopathy or  thryomegaly appreciated.  Cor: PMI nondisplaced. Regular rate & rhythm. No rubs, gallops or murmurs.  Lungs: clear  Abdomen: soft, nontender, nondistended. No hepatosplenomegaly. No bruits or masses. Good bowel sounds.  Extremities: no cyanosis, clubbing, rash, edema R and LLE ted hose  Neuro: alert & orientedx3, cranial nerves grossly intact. moves all 4 extremities w/o difficulty. Affect pleasant       Data Reviewed: Basic Metabolic Panel:  Recent Labs Lab 03/17/13 0525 03/19/13 2123 03/21/13 0500 03/22/13 0036 03/23/13 0530  NA 135 131* 134* 134* 140  K 4.3 4.7 3.9 3.4* 3.7  CL 90* 89* 90* 88* 91*  CO2 41* 34* 39* 39* 42*  GLUCOSE 118* 202* 144* 238* 195*  BUN 64* 68* 68* 64* 57*  CREATININE 2.63* 2.84* 2.75* 2.52* 2.11*  CALCIUM 9.7 9.7 9.6 9.3 9.9    Liver Function Tests:  Recent Labs Lab 03/19/13 2123  AST 18  ALT 10  ALKPHOS 109  BILITOT 0.6  PROT 7.7  ALBUMIN 4.1    Recent Labs Lab 03/19/13 2123  LIPASE 20   No results found for this basename: AMMONIA,  in the last 168 hours  CBC:  Recent Labs Lab 03/19/13 2123 03/22/13 0036  WBC 5.9 5.0  NEUTROABS 4.5  --   HGB 9.6* 8.9*  HCT 30.5* 27.3*  MCV 86.6 86.9  PLT 104* 104*    Cardiac Enzymes:  Recent Labs Lab 03/21/13 1321 03/21/13 1840 03/22/13 0036  TROPONINI <0.30 <0.30 <0.30   BNP (last 3 results)  Recent Labs  03/10/13 2117 03/13/13 0747 03/19/13 2123  PROBNP 4061.0* 4669.0* 4611.0*     CBG:  Recent Labs Lab 03/16/13 1647 03/16/13 2112 03/17/13 0559 03/17/13 1120  GLUCAP 208* 161* 109* 142*    Recent Results (from the past 240 hour(s))  URINE CULTURE     Status: None   Collection Time    03/20/13  1:33 AM      Result Value Range Status   Specimen Description URINE, CATHETERIZED   Final   Special Requests NONE   Final   Culture  Setup Time 03/20/2013 13:18   Final   Colony Count >=100,000 COLONIES/ML   Final   Culture GRAM NEGATIVE RODS   Final   Report  Status PENDING   Incomplete     Studies: Dg Chest 2 View  03/22/2013   *RADIOLOGY REPORT*  Clinical Data: Shortness of breath, follow up of effusion  CHEST - 2 VIEW  Comparison: CT chest of 03/21/2013 and chest x-ray of 03/19/2013  Findings: Opacity remains throughout much of the right lung base most consistent with a moderate to large right pleural effusion with atelectasis of the right lower lobe.  A small left pleural effusion remains.  Cardiomegaly is stable and there does appear to be mild pulmonary vascular congestion present.  Deviation of the tracheal air shadow to the right of midline is most consistent with thyroid goiter with a focus of calcification noted in the lower left neck.  IMPRESSION:  1.  Little change to some  increase in volume of the right pleural effusion with right basilar atelectasis. 2.  Tiny left pleural effusion. 3.  Cardiomegaly and pulmonary vascular congestion. 4.  Probable thyroid goiter.  Correlate clinically.   Original Report Authenticated By: Dwyane Dee, M.D.   Dg Chest 2 View  03/19/2013   *RADIOLOGY REPORT*  Clinical Data: Chest and abdominal pain  CHEST - 2 VIEW  Comparison: 03/16/2013  Findings: Moderate cardiac enlargement is again identified. Airspace consolidation within the superior segment of right lower lobe and right middle lobe is again noted and does not appear significantly changed from previous exam.  The left lung is clear. No pleural effusion is identified.  IMPRESSION:  1.  Persistent consolidation involving the right middle lobe and superior segment of right lower lobe. 2.  Cardiac enlargement.   Original Report Authenticated By: Signa Kell, M.D.   Dg Chest 2 View  03/10/2013   *RADIOLOGY REPORT*  Clinical Data: Weakness and chest pain.  No shortness of breath.  CHEST - 2 VIEW  Comparison: 02/18/2013.  Findings: Cardiac enlargement.  Right base effusion appears increased.  Right basilar atelectasis.  No infiltrate or overt edema.  Degenerative  changes thoracic spine.  IMPRESSION: Increased right pleural effusion.   Original Report Authenticated By: Davonna Belling, M.D.   Ct Chest Wo Contrast  03/21/2013   *RADIOLOGY REPORT*  Clinical Data: Shortness of breath  CT CHEST WITHOUT CONTRAST  Technique:  Multidetector CT imaging of the chest was performed following the standard protocol without IV contrast.  Comparison: CT abdomen pelvis 05/22/2012  Findings: The thyroid gland is enlarged, left lobe greater than right. There are several calcifications and multiple nodules within the left thyroid lobe.  One of the calcifications is very dense with calcified and measures 14 mm.  There is some sternal extension of the left lobe the thyroid gland.  Findings are most consistent with thyroid goiter.  There is a moderate to large right pleural effusion and a small left pleural effusion.  These effusions appear simple.  There is moderate cardiomegaly, with dilatation of both atria.  Coronary artery calcifications are present.  There is significant collapse / consolidation of the right lower lobe, overlying the pleural effusion.  There is scattered areas of atelectasis bilaterally.  There are scattered nonpathologically enlarged mediastinal lymph nodes. Sensitivity for the evaluation for hilar lymphadenopathy is decreased without intravenous contrast.  The thoracic aorta is normal in caliber.  There are extensive degenerative changes of the thoracic spine. Thoracic spine vertebral bodies are normal in height and alignment.  Imaging of the upper abdomen demonstrates a moderate degree of abdominal ascites, most prominent adjacent to the liver.  IMPRESSION:  1.  Moderate to large right and small left pleural effusions. There is extensive collapse and/or consolidation of the right lower lobe. 2. Moderate cardiomegaly with coronary artery atherosclerosis. 3.  Findings suggestive of thyroid goiter. 4.  Abdominal ascites.   Original Report Authenticated By: Britta Mccreedy, M.D.    Dg Abd Acute W/chest  03/16/2013   *RADIOLOGY REPORT*  Clinical Data: Shortness breath, abdominal pain, distention.  ACUTE ABDOMEN SERIES (ABDOMEN 2 VIEW & CHEST 1 VIEW)  Comparison: Chest x-ray 03/10/2013.  Abdomen image 10/19/2012.  Findings: There is cardiomegaly with vascular congestion.  Focal right mid and lower lung consolidation with moderate right pleural effusion.  No focal opacity on the left.  Nonobstructive bowel gas pattern.  No free air or organomegaly. Prior cholecystectomy.  Moderate stool burden throughout the colon. No suspicious calcifications.  No  acute bony abnormality.  IMPRESSION: Right mid and lower lung consolidation.  Question pneumonia. Moderate right pleural effusion.  Cardiomegaly, vascular congestion.  Prior cholecystectomy.   Original Report Authenticated By: Charlett Nose, M.D.    Scheduled Meds: . allopurinol  100 mg Oral Daily  . amiodarone  400 mg Oral Daily  . busPIRone  5 mg Oral Q12H  . carvedilol  25 mg Oral BID WC  . guaiFENesin  600 mg Oral BID  . ipratropium  0.5 mg Nebulization TID  . isosorbide-hydrALAZINE  1 tablet Oral BID  . levalbuterol  0.63 mg Nebulization TID  . OxyCODONE  10 mg Oral Q12H  . pantoprazole  40 mg Oral Daily  . polyethylene glycol  17 g Oral BID  . sodium chloride  3 mL Intravenous Q12H   Continuous Infusions: . furosemide (LASIX) infusion 15 mg/hr (03/22/13 1626)    Principal Problem:   Acute on chronic diastolic heart failure Active Problems:   DM   HYPERTENSION   COPD (chronic obstructive pulmonary disease)   CKD (chronic kidney disease)   Shortness of breath   Chronic a-fib   Warfarin-induced coagulopathy   Hypoxemia   Pleural effusion    Time spent: 40 minutes   [facility]  [facility] Pager [phone]. If 8PM-8AM, please contact night-coverage at [email or web address], password [facility] 03/23/2013, 8:18 AM  LOS: 4 days

## 2013-03-23 NOTE — Consult Note (Signed)
Patient [name]      DOB: 1927-05-24      EAV:[id number]     Consult Note from the Palliative Medicine Team at [facility]    Consult Requested by: Tonye Becket NP     PCP: Kimber Relic, MD Reason for Consultation:Goals of care    Phone Number:[phone]  Assessment of patients Current state: Patient alert, verbally responsive,sitting up in bed, appears comfortable , respirations unlabored, rate 18/min.   Reviewed chart, spoke with staff caring for patient and proceeded to have Goals of care meeting with patient, her daughters, Gina Ashley and [address]. Daughters are knowledgeable of patients progressive cardiac compromise, and understand that management of exacerbations are complicated in the context of chronic renal failure. They verbalized their understanding that they are realistic about the fact that there will come a time when conservative measures will not stabilize patient. Both daughters and patient are realistic about implementing medical measures to control patient symptoms and disease.  Discussed the philosophy of palliative care and hospice support and services provided. Also engaged in decision-making with patient/family regarding concepts of Medical Orders for Scope of Treatment pertaining to cardiac and pulmonary resuscitation, desire for acute future medical interventions for issues, the use of antibiotic therapy, intravenous hydration and continuing artificial tube feedings. A MOST form was completed and placed on chart with [name]. They would be agreeable to have Palliative care Services follow patient at SNF and assist with transition to hospice support when medical interventions become exhausted.  Patient has strong faith and realizes there are limits to what medicine can do.   Goals of Care: 1.  Code Status:DNR/DNI  At this time want to continue limited medical interventions for stabilization and symptom control  2. Scope of  Treatment: 1. Vital Signs:routine 2. Respiratory/Oxygen: support as indicated 3. Nutritional Support/Tube Feeds: No 4. Antibiotics:for defined infections 5. Blood Products: Yes 6. IVF:for defined period of time 7. Review of Medications to be discontinued: No 8. Labs: as indicated 9. Telemetry: as indicated   4. Disposition: recommend Palliative care Services to follow upon discharge to SNF   3. Symptom Management:   1. Anxiety/Agitation:Xanax 0.25 mg every 8 hours as needed  2. Pain: Oxycontin 10 mg every 12 hours, with Oxycodone 5 mg every 4 hours as needed-for chronic hip/leg/back pain-per daughter has been on since October 2013 3. DOE: encouraged patient to use Oxycodone as needed dosing for episodes of SOB 4. Bowel Regimen: Miralax twice daily, ordered Colace twice daily (patient was previously on Colace TID), will increase as needed  4. Psychosocial: Patient has recently resided with daughter Bernadene Bell, but since September has had 5 admissions in past 6 months and was discharged to area SNF's, most recently at [facility].   5. Spiritual:declined has own pastor visiting  Patient Documents Completed or Given: Document Given Completed  Advanced Directives Pkt    MOST  YES  DNR  YES  Gone from My Sight    Hard Choices YES     Brief HPI: 79 yo AAF with PMH diastolic heart failure, CKD stage 4, cardiorenal syndrome, HTN, atrial fib and sleep apnea. Admitted to hospital 5/16 with increased SOB and abdominal girth.   ROS: + intermittent abdominal discomfort and SOB with exertion, denies n/v    PMH:  Past Medical History  Diagnosis Date  . GERD (gastroesophageal reflux disease)   . Hiatal hernia   . HTN (hypertension)     all her life  . CKD (  chronic kidney disease), stage III     GFR: 38  . Atrial fibrillation     since 1996  . Depression   . Chronic diastolic heart failure 08/31/2012  . Tibia fracture     BILATERAL  . Renal failure     acute on chronic   .  Respiratory failure     acute on chronic hx of  requiring BIPAP  . COPD (chronic obstructive pulmonary disease)   . Metabolic encephalopathy     hx of   . Renal failure     acute on chronic with need for intermittent dialysis - hx of   . UTI (urinary tract infection)     hx of   . Pleural effusion     right sided now resolved   . CHF (congestive heart failure)     diastolic HF  . Complication of anesthesia     "hard to wake up" (03/11/2013)  . OSA on CPAP   . Type II diabetes mellitus 1980's?  . History of blood transfusion 08/2012    "related to leg OR" (03/11/2013)  . Arthritis     "left arm; lower back, hips, hands" (03/11/2013)  . Chronic lower back pain     "disc pops in and out" (03/11/2013)  . Gout   . Anxiety   . Fall 08/2012    "in nursing home" (03/11/2013)  . Anemia   . Chronic a-fib 03/12/2013     PSH: Past Surgical History  Procedure Laterality Date  . Bunionectomy Bilateral     bilateral  . Cholecystectomy    . Shoulder arthroscopy w/ rotator cuff repair Left 2002  . Total knee arthroplasty Bilateral 2001  . Hammer toe surgery Bilateral 2004  . Colonoscopy  07/11/2011    Procedure: COLONOSCOPY;  Surgeon: Malissa Hippo, MD;  Location: [facility];  Service: Endoscopy;  Laterality: N/A;  3:00  . Orif periprosthetic fracture  09/08/2012    Procedure: OPEN REDUCTION INTERNAL FIXATION (ORIF) PERIPROSTHETIC FRACTURE;  Surgeon: Shelda Pal, MD;  Location: [facility] ORS;  Service: Orthopedics;  Laterality: Right;  ORIF periprosthetic right proximal femur fracture   . External fixation leg  09/08/2012    Procedure: EXTERNAL FIXATION LEG;  Surgeon: Shelda Pal, MD;  Location: [facility] ORS;  Service: Orthopedics;  Laterality: Left;  . Joint replacement    . External fixation leg  11/30/2012    Procedure: EXTERNAL FIXATION LEG;  Surgeon: Shelda Pal, MD;  Location: [facility] ORS;  Service: Orthopedics;  Laterality: Left;  Removal of External Fixation Left Knee with Evaluation with  Floroscopy  . Tee without cardioversion N/A 01/19/2013    Procedure: TRANSESOPHAGEAL ECHOCARDIOGRAM (TEE);  Surgeon: Lewayne Bunting, MD;  Location: [facility] ENDOSCOPY;  Service: Cardiovascular;  Laterality: N/A;  . Shoulder open rotator cuff repair Left 2002    "maybe 3 months after scope" (03/11/2013)  . Femur fracture surgery Right 08/2012    "fell at nursing home" (03/11/2013)  . Tibia fracture surgery Left 08/2012    "fell at nursing home" (03/11/2013)   I have reviewed the FH and SH and  If appropriate update it with new information. Allergies  Allergen Reactions  . Morphine Sulfate Other (See Comments)    REACTION: change in personality  . Remeron (Mirtazapine) Other (See Comments)    Altered mental status, lethargy  . Tuna (Fish Allergy) Other (See Comments)    unknown  . Penicillins Rash    Tolerates Ancef.   Scheduled Meds: .  allopurinol  100 mg Oral Daily  . amiodarone  400 mg Oral Daily  . busPIRone  5 mg Oral Q12H  . carvedilol  25 mg Oral BID WC  . docusate sodium  100 mg Oral BID  . guaiFENesin  600 mg Oral BID  . insulin aspart  0-9 Units Subcutaneous TID WC  . ipratropium  0.5 mg Nebulization TID  . isosorbide-hydrALAZINE  1 tablet Oral BID  . levalbuterol  0.63 mg Nebulization TID  . OxyCODONE  10 mg Oral Q12H  . pantoprazole  40 mg Oral Daily  . polyethylene glycol  17 g Oral BID  . potassium chloride  40 mEq Oral BID  . sodium chloride  3 mL Intravenous Q12H   Continuous Infusions: . furosemide (LASIX) infusion 15 mg/hr (03/23/13 1100)   PRN Meds:.sodium chloride, acetaminophen, ALPRAZolam, menthol-cetylpyridinium, metoCLOPramide (REGLAN) injection, nitroGLYCERIN, ondansetron (ZOFRAN) IV, oxyCODONE, phenol, simethicone, sodium chloride    BP 115/64  Pulse 85  Temp(Src) 98.1 F (36.7 C) (Oral)  Resp 20  Ht 5\' 3"  (1.6 m)  Wt 94.212 kg (207 lb 11.2 oz)  BMI 36.8 kg/m2  SpO2 100%   PPS:50%   Intake/Output Summary (Last 24 hours) at 03/23/13 1235 Last  data filed at 03/23/13 1100  Gross per 24 hour  Intake 1231.5 ml  Output   2750 ml  Net -1518.5 ml   LBM:5/19                      Stool Softner: prior to admission Colace TID  Physical Exam:  General: Alert, sitting up in bed, NAD HEENT:  Buccal mucosa moist, oxygen on via nasal cannula Chest: CTA , diminished at bases CVS: RRR Abdomen: slightly distended, non-tender upon palpation, BS audible Ext: TEDs on  Neuro: oriented x 3 Variable  0 Points 1 Point 2 Points Total  Heart rate per minute  <90 beats 90-109 beats >110 beats 0  Respiratory  Rate per minute < 18 breaths 19-30 breaths  >30 breaths 0  Restlessness; nonpurposeful movements None  occas slight movement Frequent movement 0  Paradoxical breathing pattern: None  Present 0  Accessory muscle use: rise in clavicle during inspiration None Slight rise Pronounced rise 0  Grunting at end-expiration: guttural sound None  Present 0  Nasal flaring: involuntary movement of nares None  Present 0  Look of fear None  Eyes wide 0  Overall total out of 16    0/16    Respiratory Distress Observation Scale Journal of Palliative Medicine Vol. 13, Number 3, 2010 Campbell et al. Page. 285-290 Labs: CBC    Component Value Date/Time   WBC 5.0 03/22/2013 0036   RBC 3.14* 03/22/2013 0036   HGB 8.9* 03/22/2013 0036   HCT 27.3* 03/22/2013 0036   PLT 104* 03/22/2013 0036   MCV 86.9 03/22/2013 0036   MCH 28.3 03/22/2013 0036   MCHC 32.6 03/22/2013 0036   RDW 16.9* 03/22/2013 0036   LYMPHSABS 0.9 03/19/2013 2123   MONOABS 0.3 03/19/2013 2123   EOSABS 0.1 03/19/2013 2123   BASOSABS 0.0 03/19/2013 2123    BMET    Component Value Date/Time   NA 140 03/23/2013 0530   K 3.7 03/23/2013 0530   CL 91* 03/23/2013 0530   CO2 42* 03/23/2013 0530   GLUCOSE 195* 03/23/2013 0530   BUN 57* 03/23/2013 0530   CREATININE 2.11* 03/23/2013 0530   CALCIUM 9.9 03/23/2013 0530   GFRNONAA 21* 03/23/2013 0530   GFRAA 24* 03/23/2013 0530  CMP     Component Value  Date/Time   NA 140 03/23/2013 0530   K 3.7 03/23/2013 0530   CL 91* 03/23/2013 0530   CO2 42* 03/23/2013 0530   GLUCOSE 195* 03/23/2013 0530   BUN 57* 03/23/2013 0530   CREATININE 2.11* 03/23/2013 0530   CALCIUM 9.9 03/23/2013 0530   PROT 7.7 03/19/2013 2123   ALBUMIN 4.1 03/19/2013 2123   AST 18 03/19/2013 2123   ALT 10 03/19/2013 2123   ALKPHOS 109 03/19/2013 2123   BILITOT 0.6 03/19/2013 2123   GFRNONAA 21* 03/23/2013 0530   GFRAA 24* 03/23/2013 0530    CT scan of the ChestReviewed/Impressions:03/21/13 IMPRESSION:  1. Moderate to large right and small left pleural effusions.  There is extensive collapse and/or consolidation of the right lower  lobe.  2. Moderate cardiomegaly with coronary artery atherosclerosis.  3. Findings suggestive of thyroid goiter.  4. Abdominal ascites.  Time In Time Out Total Time Spent with Patient Total Overall Time  11:30a 12:30p 60 min 70 min    Greater than 50%  of this time was spent counseling and coordinating care related to the above assessment and plan.  Freddie Breech, CNS-C Palliative Medicine Team [facility] Team Phone: [phone] Pager: [phone]

## 2013-03-24 DIAGNOSIS — R0602 Shortness of breath: Secondary | ICD-10-CM

## 2013-03-24 DIAGNOSIS — E119 Type 2 diabetes mellitus without complications: Secondary | ICD-10-CM

## 2013-03-24 LAB — BASIC METABOLIC PANEL
BUN: 60 mg/dL — ABNORMAL HIGH (ref 6–23)
CO2: 36 mEq/L — ABNORMAL HIGH (ref 19–32)
GFR calc non Af Amer: 20 mL/min — ABNORMAL LOW (ref >90)
Glucose, Bld: 203 mg/dL — ABNORMAL HIGH (ref 70–99)
Potassium: 4.2 mEq/L (ref 3.5–5.1)
Sodium: 137 mEq/L (ref 135–145)

## 2013-03-24 LAB — GLUCOSE, CAPILLARY
Glucose-Capillary: 210 mg/dL — ABNORMAL HIGH (ref 70–99)
Glucose-Capillary: 193 mg/dL — ABNORMAL HIGH (ref 70–99)
Glucose-Capillary: 172 mg/dL — ABNORMAL HIGH (ref 70–99)

## 2013-03-24 LAB — PREPARE FRESH FROZEN PLASMA

## 2013-03-24 LAB — CHOLESTEROL, BODY FLUID: Cholesterol, Fluid: 47 mg/dL

## 2013-03-24 MED ORDER — WARFARIN SODIUM 5 MG PO TABS
5.0000 mg | ORAL_TABLET | Freq: Once | ORAL | Status: AC
  Administered 2013-03-24: 5 mg via ORAL
  Filled 2013-03-24: qty 1

## 2013-03-24 MED ORDER — SALINE SPRAY 0.65 % NA SOLN
1.0000 | NASAL | Status: DC | PRN
Start: 2013-03-24 — End: 2013-03-30
  Administered 2013-03-24: 1 via NASAL
  Filled 2013-03-24 (×2): qty 44

## 2013-03-24 MED ORDER — WARFARIN - PHARMACIST DOSING INPATIENT
Freq: Every day | Status: DC
  Administered 2013-03-24: 18:00:00

## 2013-03-24 NOTE — Progress Notes (Signed)
PULMONARY  / CRITICAL CARE MEDICINE  Name: Gina Ashley MRN: [id number] DOB: 1927/10/26    ADMISSION DATE:  03/19/2013 CONSULTATION DATE:  03/22/13  REFERRING MD :  Susie Cassette PRIMARY SERVICE:  [facility]  CHIEF COMPLAINT:  Pleural effusion   BRIEF PATIENT DESCRIPTION: 79yo female with hx CKD, Afib on coumadin, COPD, Diastolic CHF admitted 5/17 with acute on chronic diastolic CHF.  CT chest revealed mod L pleural effusion and PCCM consulted.   SIGNIFICANT EVENTS / STUDIES:  CT chest 5/18>>>1. Moderate to large right and small left pleural effusions. There is extensive collapse and/or consolidation of the right lower lobe. 2. Moderate cardiomegaly with coronary artery atherosclerosis. 3. Findings suggestive of thyroid goiter. 4. Abdominal ascites.  VITAL SIGNS: Temp:  [97.1 F (36.2 C)-98.4 F (36.9 C)] 98.4 F (36.9 C) (05/21 0640) Pulse Rate:  [83-98] 83 (05/21 0640) Resp:  [18-20] 18 (05/21 0640) BP: (110-142)/(64-80) 119/74 mmHg (05/21 0640) SpO2:  [95 %-98 %] 98 % (05/21 0837) Weight:  [91.7 kg (202 lb 2.6 oz)] 91.7 kg (202 lb 2.6 oz) (05/21 0640) 2 liters  PHYSICAL EXAMINATION: General:  Chronically ill appearing female, NAD OOB in chair  Neuro:  Awake, alert, pleasant, appropriate, MAE, gen weakness HEENT:  Mm moist, mild JVD Cardiovascular:  s1s2 irreg Lungs:  resps even non labored on 2L [place], crackles right base  Abdomen:  Soft, +bs Ext: warm and dry, BLE braces, scant BLE edema    Recent Labs Lab 03/22/13 0036 03/23/13 0530 03/24/13 0520  NA 134* 140 137  K 3.4* 3.7 4.2  CL 88* 91* 89*  CO2 39* 42* 36*  BUN 64* 57* 60*  CREATININE 2.52* 2.11* 2.19*  GLUCOSE 238* 195* 203*    Recent Labs Lab 03/19/13 2123 03/22/13 0036  HGB 9.6* 8.9*  HCT 30.5* 27.3*  WBC 5.9 5.0  PLT 104* 104*   Dg Chest 2 View  03/22/2013   *RADIOLOGY REPORT*  Clinical Data: Shortness of breath, follow up of effusion  CHEST - 2 VIEW  Comparison: CT chest of 03/21/2013 and chest x-ray of  03/19/2013  Findings: Opacity remains throughout much of the right lung base most consistent with a moderate to large right pleural effusion with atelectasis of the right lower lobe.  A small left pleural effusion remains.  Cardiomegaly is stable and there does appear to be mild pulmonary vascular congestion present.  Deviation of the tracheal air shadow to the right of midline is most consistent with thyroid goiter with a focus of calcification noted in the lower left neck.  IMPRESSION:  1.  Little change to some increase in volume of the right pleural effusion with right basilar atelectasis. 2.  Tiny left pleural effusion. 3.  Cardiomegaly and pulmonary vascular congestion. 4.  Probable thyroid goiter.  Correlate clinically.   Original Report Authenticated By: Dwyane Dee, M.D.   Dg Chest Port 1 View  03/23/2013   *RADIOLOGY REPORT*  Clinical Data: Post thoracentesis  PORTABLE CHEST - 1 VIEW  Comparison: 03/22/2013  Findings: Decreased right pleural effusion which is now very small following thoracentesis.  No pneumothorax.  Bibasilar atelectasis.  Mild vascular congestion is unchanged.  IMPRESSION: Decreased right pleural effusion following thoracentesis.  No pneumothorax.   Original Report Authenticated By: Janeece Riggers, M.D.    ASSESSMENT / PLAN: Dyspnea in setting of: Decompensated Acute on chronic diastolic CHF, Acute on chronic renal failure  volume overload,  Pleural effusion and c/b underlying COPD and Atelectasis/collapse  S/p thora on 5/20.  Protein ratio:  0.5 LDH ratio: 0.4 Effusion: transudate by light's criteria so secondary to CHF and renal failure PLAN -  -Cont aggressive diuresis as Scr tol -->ideally need to keep even volume status now.  -additional rx per heart failure team  Afib on coumadin  - Per cardiology plans  PCCM will S/O.Call PRN   Levy Pupa, MD, PhD 03/24/2013, 3:05 PM [facility] [phone] or if no answer [phone]

## 2013-03-24 NOTE — Progress Notes (Signed)
ANTICOAGULATION CONSULT NOTE   Pharmacy Consult for coumadin Indication: atrial fibrillation  Allergies  Allergen Reactions  . Morphine Sulfate Other (See Comments)    REACTION: change in personality  . Remeron (Mirtazapine) Other (See Comments)    Altered mental status, lethargy  . Tuna (Fish Allergy) Other (See Comments)    unknown  . Penicillins Rash    Tolerates Ancef.    Patient Measurements: Height: 5\' 3"  (160 cm) Weight: 202 lb 2.6 oz (91.7 kg) (BED) IBW/kg (Calculated) : 52.4  Vital Signs: Temp: 98.4 F (36.9 C) (05/21 0640) Temp src: Oral (05/21 0640) BP: 119/74 mmHg (05/21 0640) Pulse Rate: 83 (05/21 0640)  Labs:  Recent Labs  03/21/13 1321 03/21/13 1840 03/22/13 0036 03/23/13 0530 03/24/13 0520  HGB  --   --  8.9*  --   --   HCT  --   --  27.3*  --   --   PLT  --   --  104*  --   --   LABPROT  --   --  30.0* 23.4* 20.0*  INR  --   --  3.06* 2.19* 1.77*  CREATININE  --   --  2.52* 2.11* 2.19*  TROPONINI <0.30 <0.30 <0.30  --   --     Estimated Creatinine Clearance: 20.9 ml/min (by C-G formula based on Cr of 2.19).  Assessment: 79 yo female admitted with SOB and leg swelling noted with HF. INR now down to 1.7 this morning. Patient is now s/p thoracentesis yesterday (out  ), stable overnight without bleeding noted, no cbc to compare.   No further procedures planned, will restart coumadin tonight and continue to follow INR.  Home coumadin dose: 3mg /day  Goal of Therapy:  INR 2-3 Monitor platelets by anticoagulation protocol: Yes   Plan:  Resume warfarin 5mg  tonight INR in am   Sheppard Coil PharmD., BCPS Clinical Pharmacist Pager [phone] 03/24/2013 10:11 AM

## 2013-03-24 NOTE — Progress Notes (Signed)
Daughter obtained copy of patient's recent sleep study report.  Copy placed on front of patient's chart for MD review.  [facility] will order a BIPAP for patient- however they will need settings.  MD:  Please review sleep study and place BIPAP settings on dc summary. The facility will not be able to provide BIPAP without this.  Thanks!  Lorri Frederick. [name]  [phone]

## 2013-03-24 NOTE — Progress Notes (Signed)
Advanced Heart Failure Rounding Note   Subjective:    [name] is an 79 y.o. female with history of chronic atrial fibrillation on coumadin, diastolic heart failure and HTN. She also has DM type 2, COPD, OSA on CPAP, A Fib- chronic coumadin, and CKD failure with baseline Cr 2.2. She has had 5 hospitalizations in the last 6 months, 1 included tibia/fibula fracture, 1 for UTI, and the rest have been due to massive fluid overload in the setting of diastolic heart failure. Per nephrology she is not a candidate for HD.   Admitted March 2014 with massive volume overload. Weight 280 pounds. She required Milrinone and Dopamine. Permanent atrial fib and went for TEE/DCCV on 3/18 but unable to complete due to smoke. Continued on amio for rate control and coumadin. Evaluated by Nephrology due to renal failure and she was not a candidate for HD. Discharged to SNF. Discharge weight 212 pounds.   Discharged from [facility] 03/17/13 after being diuresed with Lasix drip and later transitioned to 60 mg of Torsemide daily. She was discharged back to SNF. Discharge weight 217 pounds.   She presented to [facility] ED 03/20/13 with increased dyspnea and lower extremity edema. Her weight was up a few pounds to 220. She was given 80 mg IV lasix and Metolazone 2.5 mg daily. CT with large R pleural effusion with associate RLL collapse.  Thoracentesis yesterday with 1100 clear yellow pleural fluid drained. Palliative met with family and at they still want to continue limited medical interventions for stabilization and symptom control. Stable overnight. Weight down 5 lbs. Cr 2.11. Denies SOB, orthopnea or CP.   Objective:   Weight Range:  Vital Signs:   Temp:  [97.1 F (36.2 C)-98.4 F (36.9 C)] 98.4 F (36.9 C) (05/21 0640) Pulse Rate:  [69-98] 83 (05/21 0640) Resp:  [18-20] 18 (05/21 0640) BP: (108-154)/(50-85) 119/74 mmHg (05/21 0640) SpO2:  [95 %-100 %] 95 % (05/21 0640) Weight:  [202 lb 2.6 oz (91.7 kg)] 202 lb 2.6 oz (91.7  kg) (05/21 0640) Last BM Date: 03/22/13  Weight change: Filed Weights   03/23/13 0500 03/23/13 0641 03/24/13 0640  Weight: 207 lb 11.2 oz (94.212 kg) 207 lb 11.2 oz (94.212 kg) 202 lb 2.6 oz (91.7 kg)    Intake/Output:   Intake/Output Summary (Last 24 hours) at 03/24/13 0720 Last data filed at 03/24/13 0641  Gross per 24 hour  Intake    480 ml  Output   1400 ml  Net   -920 ml     Physical Exam: General:  Elderly chronically appearing. No resp difficulty HEENT: normal Neck: supple. JVP 7-8 . Carotids 2+ bilat; no bruits. No lymphadenopathy or thryomegaly appreciated. Cor: PMI nondisplaced. Regular rate & irregular rhythm. No rubs, gallops or murmurs. Lungs: clear Abdomen: soft, nontender, distention. No hepatosplenomegaly. No bruits or masses. Good bowel sounds. Extremities: no cyanosis, clubbing, rash, 1+ edema Neuro: alert & orientedx3, cranial nerves grossly intact. moves all 4 extremities w/o difficulty. Affect pleasant  Telemetry: AFib 80-90s  Labs: Basic Metabolic Panel:  Recent Labs Lab 03/19/13 2123 03/21/13 0500 03/22/13 0036 03/23/13 0530 03/24/13 0520  NA 131* 134* 134* 140 137  K 4.7 3.9 3.4* 3.7 4.2  CL 89* 90* 88* 91* 89*  CO2 34* 39* 39* 42* 36*  GLUCOSE 202* 144* 238* 195* 203*  BUN 68* 68* 64* 57* 60*  CREATININE 2.84* 2.75* 2.52* 2.11* 2.19*  CALCIUM 9.7 9.6 9.3 9.9 9.6    Liver Function Tests:  Recent Labs  Lab 03/19/13 2123 03/23/13 1801  AST 18  --   ALT 10  --   ALKPHOS 109  --   BILITOT 0.6  --   PROT 7.7 7.8  ALBUMIN 4.1  --     Recent Labs Lab 03/19/13 2123  LIPASE 20   No results found for this basename: AMMONIA,  in the last 168 hours  CBC:  Recent Labs Lab 03/19/13 2123 03/22/13 0036  WBC 5.9 5.0  NEUTROABS 4.5  --   HGB 9.6* 8.9*  HCT 30.5* 27.3*  MCV 86.6 86.9  PLT 104* 104*    Cardiac Enzymes:  Recent Labs Lab 03/21/13 1321 03/21/13 1840 03/22/13 0036  TROPONINI <0.30 <0.30 <0.30    BNP: BNP  (last 3 results)  Recent Labs  03/10/13 2117 03/13/13 0747 03/19/13 2123  PROBNP 4061.0* 4669.0* 4611.0*     Other results:    Imaging: Dg Chest 2 View  03/22/2013   *RADIOLOGY REPORT*  Clinical Data: Shortness of breath, follow up of effusion  CHEST - 2 VIEW  Comparison: CT chest of 03/21/2013 and chest x-ray of 03/19/2013  Findings: Opacity remains throughout much of the right lung base most consistent with a moderate to large right pleural effusion with atelectasis of the right lower lobe.  A small left pleural effusion remains.  Cardiomegaly is stable and there does appear to be mild pulmonary vascular congestion present.  Deviation of the tracheal air shadow to the right of midline is most consistent with thyroid goiter with a focus of calcification noted in the lower left neck.  IMPRESSION:  1.  Little change to some increase in volume of the right pleural effusion with right basilar atelectasis. 2.  Tiny left pleural effusion. 3.  Cardiomegaly and pulmonary vascular congestion. 4.  Probable thyroid goiter.  Correlate clinically.   Original Report Authenticated By: Dwyane Dee, M.D.   Dg Chest Port 1 View  03/23/2013   *RADIOLOGY REPORT*  Clinical Data: Post thoracentesis  PORTABLE CHEST - 1 VIEW  Comparison: 03/22/2013  Findings: Decreased right pleural effusion which is now very small following thoracentesis.  No pneumothorax.  Bibasilar atelectasis.  Mild vascular congestion is unchanged.  IMPRESSION: Decreased right pleural effusion following thoracentesis.  No pneumothorax.   Original Report Authenticated By: Janeece Riggers, M.D.     Medications:     Scheduled Medications: . allopurinol  100 mg Oral Daily  . amiodarone  400 mg Oral Daily  . busPIRone  5 mg Oral Q12H  . carvedilol  25 mg Oral BID WC  . docusate sodium  100 mg Oral BID  . guaiFENesin  600 mg Oral BID  . insulin aspart  0-9 Units Subcutaneous TID WC  . ipratropium  0.5 mg Nebulization TID  .  isosorbide-hydrALAZINE  1 tablet Oral BID  . levalbuterol  0.63 mg Nebulization TID  . OxyCODONE  10 mg Oral Q12H  . pantoprazole  40 mg Oral Daily  . polyethylene glycol  17 g Oral BID  . potassium chloride  40 mEq Oral BID  . sodium chloride  3 mL Intravenous Q12H  . traZODone  50 mg Oral QHS    Infusions: . furosemide (LASIX) infusion 15 mg/hr (03/23/13 1100)    PRN Medications: sodium chloride, acetaminophen, ALPRAZolam, menthol-cetylpyridinium, metoCLOPramide (REGLAN) injection, nitroGLYCERIN, ondansetron (ZOFRAN) IV, oxyCODONE, phenol, simethicone, sodium chloride   Assessment:   1. Acute on chronic diastolic HF  2. R Pleural effusion, large  3.Acute on chronic renal failure. (Creatinine baseline 2.6)  4. h/o HTN with hypertensive heart disease  5. Obesity, suspect OHS/OSA  6. Chronic atrial fib  - on coumadin  7. Deconditioning  8. DNR  9. Cardiorenal syndrome, severe   Plan/Discussion:    Patient stable overnight. Remains on lasix gtt with stable renal function, will continue one more day and then try to transition back to PO. Goals of care discussed yesterday and family still wants limited medical interventions along with symptom management.    Length of Stay: 5  Aundria Rud 03/24/2013, 7:20 AM  Advanced Heart Failure Team Pager [phone] (M-F; 7a - 4p)  Please contact [facility] for night-coverage after hours (4p -7a ) and weekends on [email or web address]  Patient seen with NP, agree with the above note.  Will continue Lasix gtt today as she still has more volume on board.  Will transition to torsemide 100 mg daily tomorrow.   Marca Ancona 03/24/2013 1:28 PM

## 2013-03-24 NOTE — Progress Notes (Signed)
[facility] PROGRESS NOTE  Gina Ashley ZOX:[id number] DOB: 1927-08-19 DOA: 03/19/2013 PCP: Kimber Relic, MD  Assessment/Plan: Principal Problem:   Acute on chronic diastolic heart failure Active Problems:   DM   HYPERTENSION   COPD (chronic obstructive pulmonary disease)   CKD (chronic kidney disease)   Shortness of breath   Chronic a-fib   Warfarin-induced coagulopathy   Hypoxemia   Pleural effusion   Pain in back   Pain in hip   Anxiety state, unspecified   Palliative care encounter    1.Acute on ChronicDiastolic CHF- improved and after thoracentesis breathing much better. Will follow cardiology recommendations to transition lasix to PO. Will continue low sodium diet and continue strict I's and O's.  2. SOB due to #1   3. COPD -stable.   4. DM2- continue and add SSI coverage PRN.   5. HTN-stable. Continue current therapy  6. CKD- Monitor Bun/Cr, baseline CR= 2.4. , Creatinine 2.19 now; will follow.   7. Chronic Atrial Fibrillation- per cardiology. Continue coumadin, coreg and amiodarone  8. Warfarin Induced Coaguloapthy-INR down and improving. Dose manage by pharmacy. Heart failure team/cardiology to provide recommendations regarding continuation or not of anticoagulation at this point. Family is looking a more palliative approach now.   Code Status: DO NOT RESUSCITATE Family Communication: Daughter updated at bedside about patient's clinical progress Disposition Plan:  SNF with palliative care following her at discharge    Consultants:  Heart failure team  Pulmonology  Palliative care  Procedures:  None  Antibiotics: None  HPI/Subjective: Continue complaining of difficulty with deep breaths; but overall much better. No fever, no CP  Objective: Filed Vitals:   03/23/13 1914 03/23/13 2213 03/24/13 0640 03/24/13 0837  BP: 142/80 140/77 119/74   Pulse: 88 83 83   Temp: 98 F (36.7 C) 97.4 F (36.3 C) 98.4 F (36.9 C)   TempSrc: Oral  Oral Oral   Resp: 20 18 18    Height:      Weight:   91.7 kg (202 lb 2.6 oz)   SpO2:  96% 95% 98%    Intake/Output Summary (Last 24 hours) at 03/24/13 1149 Last data filed at 03/24/13 1022  Gross per 24 hour  Intake    480 ml  Output   1551 ml  Net  -1071 ml    Exam:  General: Elderly chronically ill appearing. No resp distress; no CP  HEENT: normal  Neck: supple. No bruits. No lymphadenopathy or thryomegaly appreciated. Positive JVD Cor: PMI nondisplaced. Regular rate & rhythm. No rubs, gallops or murmurs.  Lungs: clear bilaterally Abdomen: soft, nontender, nondistended. No hepatosplenomegaly. No bruits or masses. Good bowel sounds.  Extremities: no cyanosis, clubbing, rash, edema R and LLE ted hose  Neuro: alert & orientedx3, cranial nerves grossly intact. moves all 4 extremities w/o difficulty. Affect pleasant    Data Reviewed: Basic Metabolic Panel:  Recent Labs Lab 03/19/13 2123 03/21/13 0500 03/22/13 0036 03/23/13 0530 03/24/13 0520  NA 131* 134* 134* 140 137  K 4.7 3.9 3.4* 3.7 4.2  CL 89* 90* 88* 91* 89*  CO2 34* 39* 39* 42* 36*  GLUCOSE 202* 144* 238* 195* 203*  BUN 68* 68* 64* 57* 60*  CREATININE 2.84* 2.75* 2.52* 2.11* 2.19*  CALCIUM 9.7 9.6 9.3 9.9 9.6    Liver Function Tests:  Recent Labs Lab 03/19/13 2123 03/23/13 1801  AST 18  --   ALT 10  --   ALKPHOS 109  --   BILITOT 0.6  --  PROT 7.7 7.8  ALBUMIN 4.1  --     Recent Labs Lab 03/19/13 2123  LIPASE 20   No results found for this basename: AMMONIA,  in the last 168 hours  CBC:  Recent Labs Lab 03/19/13 2123 03/22/13 0036  WBC 5.9 5.0  NEUTROABS 4.5  --   HGB 9.6* 8.9*  HCT 30.5* 27.3*  MCV 86.6 86.9  PLT 104* 104*    Cardiac Enzymes:  Recent Labs Lab 03/21/13 1321 03/21/13 1840 03/22/13 0036  TROPONINI <0.30 <0.30 <0.30   BNP (last 3 results)  Recent Labs  03/10/13 2117 03/13/13 0747 03/19/13 2123  PROBNP 4061.0* 4669.0* 4611.0*     CBG:  Recent  Labs Lab 03/23/13 1141 03/23/13 1629 03/23/13 2156 03/24/13 0621 03/24/13 1119  GLUCAP 240* 176* 224* 210* 193*    Recent Results (from the past 240 hour(s))  URINE CULTURE     Status: None   Collection Time    03/20/13  1:33 AM      Result Value Range Status   Specimen Description URINE, CATHETERIZED   Final   Special Requests NONE   Final   Culture  Setup Time 03/20/2013 13:18   Final   Colony Count >=100,000 COLONIES/ML   Final   Culture     Final   Value: KLEBSIELLA PNEUMONIAE     Note: THIS ISOLATE HAS BEEN CONFIRMED AS A  [facility] CARBAPENEMASE PRODUCER COLISTIN 0.125ug/mL ETEST results for this drug are "FOR INVESTIGATIONAL USE ONLY" and should NOT be used for clinical purposes. CRITICAL RESULT CALLED TO, READ BACK BY AND VERIFIED      WITH: TIFFANY @ 1:38PM  03/23/13 BY DWEEKS   Report Status 03/23/2013 FINAL   Final   Organism ID, Bacteria KLEBSIELLA PNEUMONIAE   Final  BODY FLUID CULTURE     Status: None   Collection Time    03/23/13  4:46 PM      Result Value Range Status   Specimen Description PLEURAL FLUID RIGHT   Final   Special Requests Immunocompromised   Final   Gram Stain     Final   Value: NO WBC SEEN     NO ORGANISMS SEEN   Culture NO GROWTH   Final   Report Status PENDING   Incomplete     Studies: Dg Chest 2 View  03/22/2013   *RADIOLOGY REPORT*  Clinical Data: Shortness of breath, follow up of effusion  CHEST - 2 VIEW  Comparison: CT chest of 03/21/2013 and chest x-ray of 03/19/2013  Findings: Opacity remains throughout much of the right lung base most consistent with a moderate to large right pleural effusion with atelectasis of the right lower lobe.  A small left pleural effusion remains.  Cardiomegaly is stable and there does appear to be mild pulmonary vascular congestion present.  Deviation of the tracheal air shadow to the right of midline is most consistent with thyroid goiter with a focus of calcification noted in the lower left neck.  IMPRESSION:  1.   Little change to some increase in volume of the right pleural effusion with right basilar atelectasis. 2.  Tiny left pleural effusion. 3.  Cardiomegaly and pulmonary vascular congestion. 4.  Probable thyroid goiter.  Correlate clinically.   Original Report Authenticated By: Dwyane Dee, M.D.   Dg Chest 2 View  03/19/2013   *RADIOLOGY REPORT*  Clinical Data: Chest and abdominal pain  CHEST - 2 VIEW  Comparison: 03/16/2013  Findings: Moderate cardiac enlargement is again identified. Airspace consolidation  within the superior segment of right lower lobe and right middle lobe is again noted and does not appear significantly changed from previous exam.  The left lung is clear. No pleural effusion is identified.  IMPRESSION:  1.  Persistent consolidation involving the right middle lobe and superior segment of right lower lobe. 2.  Cardiac enlargement.   Original Report Authenticated By: Signa Kell, M.D.   Dg Chest 2 View  03/10/2013   *RADIOLOGY REPORT*  Clinical Data: Weakness and chest pain.  No shortness of breath.  CHEST - 2 VIEW  Comparison: 02/18/2013.  Findings: Cardiac enlargement.  Right base effusion appears increased.  Right basilar atelectasis.  No infiltrate or overt edema.  Degenerative changes thoracic spine.  IMPRESSION: Increased right pleural effusion.   Original Report Authenticated By: Davonna Belling, M.D.   Ct Chest Wo Contrast  03/21/2013   *RADIOLOGY REPORT*  Clinical Data: Shortness of breath  CT CHEST WITHOUT CONTRAST  Technique:  Multidetector CT imaging of the chest was performed following the standard protocol without IV contrast.  Comparison: CT abdomen pelvis 05/22/2012  Findings: The thyroid gland is enlarged, left lobe greater than right. There are several calcifications and multiple nodules within the left thyroid lobe.  One of the calcifications is very dense with calcified and measures 14 mm.  There is some sternal extension of the left lobe the thyroid gland.  Findings are most  consistent with thyroid goiter.  There is a moderate to large right pleural effusion and a small left pleural effusion.  These effusions appear simple.  There is moderate cardiomegaly, with dilatation of both atria.  Coronary artery calcifications are present.  There is significant collapse / consolidation of the right lower lobe, overlying the pleural effusion.  There is scattered areas of atelectasis bilaterally.  There are scattered nonpathologically enlarged mediastinal lymph nodes. Sensitivity for the evaluation for hilar lymphadenopathy is decreased without intravenous contrast.  The thoracic aorta is normal in caliber.  There are extensive degenerative changes of the thoracic spine. Thoracic spine vertebral bodies are normal in height and alignment.  Imaging of the upper abdomen demonstrates a moderate degree of abdominal ascites, most prominent adjacent to the liver.  IMPRESSION:  1.  Moderate to large right and small left pleural effusions. There is extensive collapse and/or consolidation of the right lower lobe. 2. Moderate cardiomegaly with coronary artery atherosclerosis. 3.  Findings suggestive of thyroid goiter. 4.  Abdominal ascites.   Original Report Authenticated By: Britta Mccreedy, M.D.   Dg Abd Acute W/chest  03/16/2013   *RADIOLOGY REPORT*  Clinical Data: Shortness breath, abdominal pain, distention.  ACUTE ABDOMEN SERIES (ABDOMEN 2 VIEW & CHEST 1 VIEW)  Comparison: Chest x-ray 03/10/2013.  Abdomen image 10/19/2012.  Findings: There is cardiomegaly with vascular congestion.  Focal right mid and lower lung consolidation with moderate right pleural effusion.  No focal opacity on the left.  Nonobstructive bowel gas pattern.  No free air or organomegaly. Prior cholecystectomy.  Moderate stool burden throughout the colon. No suspicious calcifications.  No acute bony abnormality.  IMPRESSION: Right mid and lower lung consolidation.  Question pneumonia. Moderate right pleural effusion.  Cardiomegaly,  vascular congestion.  Prior cholecystectomy.   Original Report Authenticated By: Charlett Nose, M.D.    Scheduled Meds: . allopurinol  100 mg Oral Daily  . amiodarone  400 mg Oral Daily  . busPIRone  5 mg Oral Q12H  . carvedilol  25 mg Oral BID WC  . docusate sodium  100 mg Oral  BID  . guaiFENesin  600 mg Oral BID  . insulin aspart  0-9 Units Subcutaneous TID WC  . ipratropium  0.5 mg Nebulization TID  . isosorbide-hydrALAZINE  1 tablet Oral BID  . levalbuterol  0.63 mg Nebulization TID  . OxyCODONE  10 mg Oral Q12H  . pantoprazole  40 mg Oral Daily  . polyethylene glycol  17 g Oral BID  . potassium chloride  40 mEq Oral BID  . sodium chloride  3 mL Intravenous Q12H  . traZODone  50 mg Oral QHS  . warfarin  5 mg Oral ONCE-1800  . Warfarin - Pharmacist Dosing Inpatient   Does not apply q1800   Continuous Infusions: . furosemide (LASIX) infusion 15 mg/hr (03/23/13 1100)    Principal Problem:   Acute on chronic diastolic heart failure Active Problems:   DM   HYPERTENSION   COPD (chronic obstructive pulmonary disease)   CKD (chronic kidney disease)   Shortness of breath   Chronic a-fib   Warfarin-induced coagulopathy   Hypoxemia   Pleural effusion   Pain in back   Pain in hip   Anxiety state, unspecified   Palliative care encounter    Time spent: >30 minutes   Mouhamad Teed  [facility] Pager [phone]. If 8PM-8AM, please contact night-coverage at [email or web address], password [facility] 03/24/2013, 11:49 AM  LOS: 5 days

## 2013-03-24 NOTE — Progress Notes (Signed)
Inpatient Diabetes Program Recommendations  AACE/ADA: New Consensus Statement on Inpatient Glycemic Control (2013)  Target Ranges:  Prepandial:   less than 140 mg/dL      Peak postprandial:   less than 180 mg/dL (1-2 hours)      Critically ill patients:  140 - 180 mg/dL     Results for Gina Ashley, Gina Ashley (MRN [id number]) as of 03/24/2013 12:00  Ref. Range 03/24/2013 06:21 03/24/2013 11:19  Glucose-Capillary Latest Range: 70-99 mg/dL 409 (H) 811 (H)    Noted Novolog Sensitive correction scale (SSI) started yesterday at noon.  CBGs still elevated today.  MD- Please consider starting patient's home Lantus dose- Lantus 5 units QHS   Will follow. Ambrose Finland RN, MSN, CDE Diabetes Coordinator Inpatient Diabetes Program [phone]

## 2013-03-24 NOTE — Plan of Care (Signed)
Problem: Phase III Progression Outcomes Goal: Tolerating diet Outcome: Completed/Met Date Met:  03/24/13 Pt tolerating diet well, no c/o nv

## 2013-03-24 NOTE — Progress Notes (Signed)
Utilization Review Completed.   Carren Blakley, RN, BSN Nurse Case Manager  [phone]  

## 2013-03-25 ENCOUNTER — Encounter (HOSPITAL_COMMUNITY): Payer: Medicare Other

## 2013-03-25 DIAGNOSIS — J90 Pleural effusion, not elsewhere classified: Secondary | ICD-10-CM

## 2013-03-25 DIAGNOSIS — F411 Generalized anxiety disorder: Secondary | ICD-10-CM

## 2013-03-25 DIAGNOSIS — I5033 Acute on chronic diastolic (congestive) heart failure: Secondary | ICD-10-CM

## 2013-03-25 DIAGNOSIS — M25559 Pain in unspecified hip: Secondary | ICD-10-CM

## 2013-03-25 DIAGNOSIS — M549 Dorsalgia, unspecified: Secondary | ICD-10-CM

## 2013-03-25 DIAGNOSIS — Z228 Carrier of other infectious diseases: Secondary | ICD-10-CM

## 2013-03-25 DIAGNOSIS — Z515 Encounter for palliative care: Secondary | ICD-10-CM

## 2013-03-25 LAB — GLUCOSE, CAPILLARY
Glucose-Capillary: 149 mg/dL — ABNORMAL HIGH (ref 70–99)
Glucose-Capillary: 172 mg/dL — ABNORMAL HIGH (ref 70–99)
Glucose-Capillary: 240 mg/dL — ABNORMAL HIGH (ref 70–99)

## 2013-03-25 LAB — BASIC METABOLIC PANEL
BUN: 58 mg/dL — ABNORMAL HIGH (ref 6–23)
CO2: 37 mEq/L — ABNORMAL HIGH (ref 19–32)
Chloride: 90 mEq/L — ABNORMAL LOW (ref 96–112)
Creatinine, Ser: 2.14 mg/dL — ABNORMAL HIGH (ref 0.50–1.10)
Glucose, Bld: 161 mg/dL — ABNORMAL HIGH (ref 70–99)
Potassium: 4.2 mEq/L (ref 3.5–5.1)

## 2013-03-25 LAB — PROTIME-INR: Prothrombin Time: 21.3 seconds — ABNORMAL HIGH (ref 11.6–15.2)

## 2013-03-25 MED ORDER — TORSEMIDE 20 MG PO TABS
60.0000 mg | ORAL_TABLET | Freq: Two times a day (BID) | ORAL | Status: DC
  Administered 2013-03-25 – 2013-03-30 (×11): 60 mg via ORAL
  Filled 2013-03-25 (×13): qty 3

## 2013-03-25 MED ORDER — WARFARIN SODIUM 3 MG PO TABS
3.0000 mg | ORAL_TABLET | Freq: Once | ORAL | Status: AC
  Administered 2013-03-25: 3 mg via ORAL
  Filled 2013-03-25: qty 1

## 2013-03-25 NOTE — Progress Notes (Signed)
Advanced Heart Failure Rounding Note   Subjective:    Gina Ashley is an 79 y.o. female with history of chronic atrial fibrillation on coumadin, diastolic heart failure and HTN. She also has DM type 2, COPD, OSA on CPAP, A Fib- chronic coumadin, and CKD failure with baseline Cr 2.2. She has had 5 hospitalizations in the last 6 months, 1 included tibia/fibula fracture, 1 for UTI, and the rest have been due to massive fluid overload in the setting of diastolic heart failure. Per nephrology she is not a candidate for HD.   Admitted March 2014 with massive volume overload. Weight 280 pounds. She required Milrinone and Dopamine. Permanent atrial fib and went for TEE/DCCV on 3/18 but unable to complete due to smoke. Continued on amio for rate control and coumadin. Evaluated by Nephrology due to renal failure and she was not a candidate for HD. Discharged to SNF. Discharge weight 212 pounds.   Discharged from [facility] 03/17/13 after being diuresed with Lasix drip and later transitioned to 60 mg of Torsemide daily. She was discharged back to SNF. Discharge weight 217 pounds.   She presented to [facility] ED 03/20/13 with increased dyspnea and lower extremity edema. Her weight was up a few pounds to 220. She was given 80 mg IV lasix and Metolazone 2.5 mg daily. CT with large R pleural effusion with associate RLL collapse.  Thoracentesis 5/20 1100 clear yellow pleural fluid drained. Palliative meeting earlier this week and they want to continue limited medical interventions for stabilization and symptom control. Stable overnight. Weight up 9lbs (on new bed), 24 hr I/O -600 . Cr 2.14. Denies SOB, orthopnea or CP. Still on lasix gtt, however foley was removed yesterday.Incontinent everywhere. Plans to d/c to [facility] today in the works.  Objective:   Weight Range:  Vital Signs:   Temp:  [97.4 F (36.3 C)-98.1 F (36.7 C)] 98.1 F (36.7 C) (05/22 0639) Pulse Rate:  [65-88] 65 (05/22 0639) Resp:  [18-20] 19 (05/22  0639) BP: (116-160)/(61-71) 128/61 mmHg (05/22 0639) SpO2:  [98 %-100 %] 100 % (05/22 0904) Weight:  [95.9 kg (211 lb 6.7 oz)] 95.9 kg (211 lb 6.7 oz) (05/22 0658) Last BM Date: 03/24/13  Weight change: Filed Weights   03/23/13 0641 03/24/13 0640 03/25/13 0658  Weight: 94.212 kg (207 lb 11.2 oz) 91.7 kg (202 lb 2.6 oz) 95.9 kg (211 lb 6.7 oz)    Intake/Output:   Intake/Output Summary (Last 24 hours) at 03/25/13 1004 Last data filed at 03/24/13 1732  Gross per 24 hour  Intake    220 ml  Output   1100 ml  Net   -880 ml     Physical Exam: General:  Elderly chronically appearing. No resp difficulty; laying in bed HEENT: normal Neck: supple. JVP 9-10. Carotids 2+ bilat; no bruits. No lymphadenopathy or thryomegaly appreciated. Cor: PMI nondisplaced. Regular rate & irregular rhythm. No rubs, gallops or murmurs. Lungs: clear Abdomen: soft, nontender, mild distention. No hepatosplenomegaly. No bruits or masses. Good bowel sounds. Extremities: no cyanosis, clubbing, rash, trace edema Neuro: alert & orientedx3, cranial nerves grossly intact. moves all 4 extremities w/o difficulty. Affect pleasant  Telemetry: AFib 80-90s  Labs: Basic Metabolic Panel:  Recent Labs Lab 03/21/13 0500 03/22/13 0036 03/23/13 0530 03/24/13 0520 03/25/13 0430  NA 134* 134* 140 137 136  K 3.9 3.4* 3.7 4.2 4.2  CL 90* 88* 91* 89* 90*  CO2 39* 39* 42* 36* 37*  GLUCOSE 144* 238* 195* 203* 161*  BUN 68*  64* 57* 60* 58*  CREATININE 2.75* 2.52* 2.11* 2.19* 2.14*  CALCIUM 9.6 9.3 9.9 9.6 9.7    Liver Function Tests:  Recent Labs Lab 03/19/13 2123 03/23/13 1801  AST 18  --   ALT 10  --   ALKPHOS 109  --   BILITOT 0.6  --   PROT 7.7 7.8  ALBUMIN 4.1  --     Recent Labs Lab 03/19/13 2123  LIPASE 20   No results found for this basename: AMMONIA,  in the last 168 hours  CBC:  Recent Labs Lab 03/19/13 2123 03/22/13 0036  WBC 5.9 5.0  NEUTROABS 4.5  --   HGB 9.6* 8.9*  HCT 30.5*  27.3*  MCV 86.6 86.9  PLT 104* 104*    Cardiac Enzymes:  Recent Labs Lab 03/21/13 1321 03/21/13 1840 03/22/13 0036  TROPONINI <0.30 <0.30 <0.30    BNP: BNP (last 3 results)  Recent Labs  03/10/13 2117 03/13/13 0747 03/19/13 2123  PROBNP 4061.0* 4669.0* 4611.0*     Other results:    Imaging: Dg Chest Port 1 View  03/23/2013   *RADIOLOGY REPORT*  Clinical Data: Post thoracentesis  PORTABLE CHEST - 1 VIEW  Comparison: 03/22/2013  Findings: Decreased right pleural effusion which is now very small following thoracentesis.  No pneumothorax.  Bibasilar atelectasis.  Mild vascular congestion is unchanged.  IMPRESSION: Decreased right pleural effusion following thoracentesis.  No pneumothorax.   Original Report Authenticated By: Janeece Riggers, M.D.     Medications:     Scheduled Medications: . allopurinol  100 mg Oral Daily  . amiodarone  400 mg Oral Daily  . busPIRone  5 mg Oral Q12H  . carvedilol  25 mg Oral BID WC  . docusate sodium  100 mg Oral BID  . guaiFENesin  600 mg Oral BID  . insulin aspart  0-9 Units Subcutaneous TID WC  . ipratropium  0.5 mg Nebulization TID  . isosorbide-hydrALAZINE  1 tablet Oral BID  . levalbuterol  0.63 mg Nebulization TID  . OxyCODONE  10 mg Oral Q12H  . pantoprazole  40 mg Oral Daily  . polyethylene glycol  17 g Oral BID  . potassium chloride  40 mEq Oral BID  . sodium chloride  3 mL Intravenous Q12H  . traZODone  50 mg Oral QHS  . warfarin  3 mg Oral ONCE-1800  . Warfarin - Pharmacist Dosing Inpatient   Does not apply q1800    Infusions: . furosemide (LASIX) infusion 15 mg/hr (03/25/13 0451)    PRN Medications: sodium chloride, acetaminophen, ALPRAZolam, menthol-cetylpyridinium, metoCLOPramide (REGLAN) injection, nitroGLYCERIN, ondansetron (ZOFRAN) IV, oxyCODONE, phenol, simethicone, sodium chloride, sodium chloride   Assessment:   1. Acute on chronic diastolic HF  2. R Pleural effusion, large  3.Acute on chronic renal  failure. (Creatinine baseline 2.6)  4. h/o HTN with hypertensive heart disease  5. Obesity, suspect OHS/OSA  6. Chronic atrial fib  - on coumadin  7. Deconditioning  8. DNR  9. Cardiorenal syndrome, severe   Plan/Discussion:    Patient stable overnight. Volume status seems better and breathing better after thoracentesis.  Weight up, however patient was weighed on a new bed today. I/O not accurate because foley was removed and patient not able to get on bedpan quick enough.   Overall volume status much better but still with some fluid on board. Given multiple readmissions, for HF I think it is imperative that we demonstrate stability of her weight, volume status and renal function on her oral  diuretics prior to d/c or she will come right back. Will increase demadex to 60 bid. Put Foley back in for now.   Given high CHADS score would continue anticoagulation. Can switch to apixaban if she can afford.  Appreciate Palliative Care's input.  Length of Stay: 6  Daniel Bensimhon 03/25/2013, 10:04 AM  Advanced Heart Failure Team Pager [phone] (M-F; 7a - 4p)  Please contact [facility] for night-coverage after hours (4p -7a ) and weekends on [email or web address]

## 2013-03-25 NOTE — Progress Notes (Signed)
Patient Gina Ashley      DOB: [birth year]      EAV:[id number]   Palliative Medicine Team at [facility] Progress Note    Subjective:Patient resting comfortably, respirations unlabored, denies any pain,  Filed Vitals:   03/25/13 0639  BP: 128/61  Pulse: 65  Temp: 98.1 F (36.7 C)  Resp: 19   Physical exam: General: Alert, appears comfortable CHEST: CTA CVS: RRR ABD: soft, BS audible EXT: trace pedal edema bilaterally NEURO: oriented  Assessment and plan: 79 yo AAF with PMH diastolic heart failure, CKD stage 4, cardiorenal syndrome, HTN, atrial fib and sleep apnea. Admitted to hospital 5/16 with increased SOB and abdominal girth. S/P thoracentesis 5/20 (1100 cc).  1) Code status: DNR/DNI  2) Anxiety: Xanax 0.25 mg every 8 hours as needed  3) Pain: (chronic hip/leg/back) Oxycontin 10 mg oral every 12 hours, with Oxycodone 5 mg every 4 hours/prn (used 20 mg /24 hours)  4) Disposition: plan for discharge to [facility] SNF with Palliative Care services to follow.  Time In Time Out Total Time Spent with Patient Total Overall Time  10:30a 10:45a 15 min 15 min    Freddie Breech, CNS-C Palliative Medicine Team [facility] Team Phone: [phone] Pager: [phone]

## 2013-03-25 NOTE — Progress Notes (Signed)
Physical Therapy Treatment Patient Details Name: Gina Ashley MRN: [id number] DOB: [birth year] Today's Date: 03/25/2013 Time: 6283-1517 PT Time Calculation (min): 23 min  PT Assessment / Plan / Recommendation Comments on Treatment Session  Pt with improved stand pivot transfer but continues to require max A to stand. Pt would continue to benefit from SNF on d/c to progress OOB mobility.    Follow Up Recommendations  SNF     Equipment Recommendations  None recommended by PT    Frequency Min 3X/week   Plan Discharge plan remains appropriate;Frequency remains appropriate    Precautions / Restrictions Precautions Precautions: Fall Precaution Comments: LLE upright AFO Restrictions Weight Bearing Restrictions: No   Pertinent Vitals/Pain Pt denies pain    Mobility  Bed Mobility Bed Mobility: Supine to Sit;Sitting - Scoot to Edge of Bed Left Sidelying to Sit: 4: Min assist;With rails;HOB elevated Sitting - Scoot to [place] of Bed: 4: Min assist Details for Bed Mobility Assistance: PT able to move feet and reach for rail Transfers Transfers: Sit to Stand;Stand to [employer] Transfers Sit to Stand: With upper extremity assist;From bed;2: Max assist (2 attempts) Stand to Sit: Without upper extremity assist;3: Mod assist Stand Pivot Transfers: 2: Max assist Details for Transfer Assistance: Pt was able to pivot with vmax verbal cues. Pt able to assist with bed mobility but still requires max A with v/c's sit to stand    Exercises     PT Diagnosis:    PT Problem List:   PT Treatment Interventions:     PT Goals Acute Rehab PT Goals PT Goal Formulation: With patient Time For Goal Achievement: 04/05/13 Potential to Achieve Goals: Good PT Goal: Supine/Side to Sit - Progress: Progressing toward goal PT Transfer Goal: Bed to Chair/Chair to Bed - Progress: Progressing toward goal PT Goal: Stand - Progress: Progressing toward goal  Visit Information  Last PT Received On:  03/25/13 Assistance Needed: +2 (for safety)    Subjective Data  Subjective: Pt received supine in bed agreeable to PT Patient Stated Goal: I want to get out of bed, I am hungry   Cognition  Cognition Arousal/Alertness: Awake/alert Behavior During Therapy: WFL for tasks assessed/performed Overall Cognitive Status: Within Functional Limits for tasks assessed    Balance     End of Session PT - End of Session Equipment Utilized During Treatment: Gait belt Activity Tolerance: Patient tolerated treatment well Patient left: in chair;with nursing in room;with call bell/phone within reach Nurse Communication: Mobility status   GP     03/25/2013, 1:19 PM Marvis Moeller, Student Physical Therapist Office #: [phone]  [place], PT DPT [phone]

## 2013-03-25 NOTE — Plan of Care (Signed)
Problem: Phase I Progression Outcomes Goal: Voiding-avoid urinary catheter unless indicated Outcome: Completed/Met Date Met:  03/25/13 Foley discontinued 03/24/13.

## 2013-03-25 NOTE — Progress Notes (Signed)
Placed pt on CPAP via nasal mask, settings of 10.0 cm H20 with 2 lpm O2 bleed in. Pt. Tolerating well. RN aware.

## 2013-03-25 NOTE — Progress Notes (Signed)
ANTICOAGULATION CONSULT NOTE   Pharmacy Consult for coumadin Indication: atrial fibrillation  Allergies  Allergen Reactions  . Morphine Sulfate Other (See Comments)    REACTION: change in personality  . Remeron (Mirtazapine) Other (See Comments)    Altered mental status, lethargy  . Tuna (Fish Allergy) Other (See Comments)    unknown  . Penicillins Rash    Tolerates Ancef.    Patient Measurements: Height: 5\' 3"  (160 cm) Weight: 211 lb 6.7 oz (95.9 kg) (pt was switched from air overlay mattress to reg matt 5/21) IBW/kg (Calculated) : 52.4  Vital Signs: Temp: 98.1 F (36.7 C) (05/22 0639) Temp src: Oral (05/22 0639) BP: 128/61 mmHg (05/22 0639) Pulse Rate: 65 (05/22 0639)  Labs:  Recent Labs  03/23/13 0530 03/24/13 0520 03/25/13 0430  LABPROT 23.4* 20.0* 21.3*  INR 2.19* 1.77* 1.93*  CREATININE 2.11* 2.19* 2.14*    Estimated Creatinine Clearance: 21.9 ml/min (by C-G formula based on Cr of 2.14).  Assessment: 79 yo female admitted with SOB and leg swelling noted with HF.  Thoracentesis 5/20 - no complications noted.  INR now trending up 1.9 this morning.   No further procedures planned.  Home coumadin dose: 3mg /day  Goal of Therapy:  INR 2-3 Monitor platelets by anticoagulation protocol: Yes   Plan:  Resume warfarin 3mg  tonight INR in am   Sheppard Coil PharmD., BCPS Clinical Pharmacist Pager [phone] 03/25/2013 7:49 AM

## 2013-03-25 NOTE — Progress Notes (Addendum)
[facility] PROGRESS NOTE  Gina Ashley ZOX:[id number] DOB: 12-Dec-1926 DOA: 03/19/2013 PCP: Kimber Relic, MD  Assessment/Plan: Principal Problem:   Acute on chronic diastolic heart failure Active Problems:   DM   HYPERTENSION   COPD (chronic obstructive pulmonary disease)   CKD (chronic kidney disease)   Shortness of breath   Chronic a-fib   Warfarin-induced coagulopathy   Hypoxemia   Pleural effusion   Pain in back   Pain in hip   Anxiety state, unspecified   Palliative care encounter    1.Acute on ChronicDiastolic CHF- improved and after thoracentesis breathing much better.  -Will transition to demadex 60mg  BID, continue following daily weights -Will continue low sodium diet and continue strict I's and O's. -with weight and breathing remains stable probably back to SNF tomorrow.  2. SOB due to #1   3. COPD -stable.   4. DM2- continue carb modified diet and SSI coverage.  5. HTN-stable. Continue current therapy  6. CKD- Monitor Bun/Cr, baseline CR= 2.4. , Creatinine stable and baseline. Will follow trend  7. Chronic Atrial Fibrillation- per cardiology. Continue coumadin, coreg and amiodarone; rate controlled  8. Warfarin Induced Coaguloapthy-INR down and improving. Dose manage by pharmacy. Per cardiology rec's continue coumadin  9-Chronic pain: will continue pain meds as recommended by palliative team (on oxycontin and oxycodone)  10-Anxiety: continue PRN xanax.  11-Klebsiella colonization in urine: at this point no signs or symptoms for infections. Will hold on antibiotics.  Code Status: DO NOT RESUSCITATE Family Communication: Daughter updated at bedside about patient's clinical progress Disposition Plan:  SNF with palliative care following her at discharge    Consultants:  Heart failure team  Pulmonology  Palliative care  Procedures:  None  Antibiotics: None  HPI/Subjective: Breathing better; no CP or palpitations.  Afebrile  Objective: Filed Vitals:   03/25/13 0658 03/25/13 0904 03/25/13 1305 03/25/13 1321  BP:   108/61   Pulse:   80   Temp:   97.5 F (36.4 C)   TempSrc:   Oral   Resp:   18   Height:      Weight: 95.9 kg (211 lb 6.7 oz)     SpO2:  100% 98% 98%    Intake/Output Summary (Last 24 hours) at 03/25/13 1519 Last data filed at 03/25/13 1306  Gross per 24 hour  Intake    240 ml  Output    250 ml  Net    -10 ml    Exam:  General: Elderly chronically ill appearing. No resp distress; no CP  HEENT: normal  Neck: supple. No bruits. No lymphadenopathy or thryomegaly appreciated. Positive JVD Cardiology: PMI nondisplaced. Regular rate & rhythm. No rubs, gallops or murmurs.  Lungs: clear bilaterally Abdomen: soft, nontender, nondistended. No hepatosplenomegaly. No bruits or masses. Good bowel sounds.  Extremities: no cyanosis, trace edema bilaterally Neuro: alert & orientedx3, cranial nerves grossly intact. moves all 4 extremities w/o difficulty. Affect pleasant    Data Reviewed: Basic Metabolic Panel:  Recent Labs Lab 03/21/13 0500 03/22/13 0036 03/23/13 0530 03/24/13 0520 03/25/13 0430  NA 134* 134* 140 137 136  K 3.9 3.4* 3.7 4.2 4.2  CL 90* 88* 91* 89* 90*  CO2 39* 39* 42* 36* 37*  GLUCOSE 144* 238* 195* 203* 161*  BUN 68* 64* 57* 60* 58*  CREATININE 2.75* 2.52* 2.11* 2.19* 2.14*  CALCIUM 9.6 9.3 9.9 9.6 9.7    Liver Function Tests:  Recent Labs Lab 03/19/13 2123 03/23/13 1801  AST 18  --  ALT 10  --   ALKPHOS 109  --   BILITOT 0.6  --   PROT 7.7 7.8  ALBUMIN 4.1  --     Recent Labs Lab 03/19/13 2123  LIPASE 20   CBC:  Recent Labs Lab 03/19/13 2123 03/22/13 0036  WBC 5.9 5.0  NEUTROABS 4.5  --   HGB 9.6* 8.9*  HCT 30.5* 27.3*  MCV 86.6 86.9  PLT 104* 104*    Cardiac Enzymes:  Recent Labs Lab 03/21/13 1321 03/21/13 1840 03/22/13 0036  TROPONINI <0.30 <0.30 <0.30   BNP (last 3 results)  Recent Labs  03/10/13 2117  03/13/13 0747 03/19/13 2123  PROBNP 4061.0* 4669.0* 4611.0*     CBG:  Recent Labs Lab 03/24/13 1119 03/24/13 1627 03/24/13 2112 03/25/13 0644 03/25/13 1101  GLUCAP 193* 172* 189* 149* 172*    Recent Results (from the past 240 hour(s))  URINE CULTURE     Status: None   Collection Time    03/20/13  1:33 AM      Result Value Range Status   Specimen Description URINE, CATHETERIZED   Final   Special Requests NONE   Final   Culture  Setup Time 03/20/2013 13:18   Final   Colony Count >=100,000 COLONIES/ML   Final   Culture     Final   Value: KLEBSIELLA PNEUMONIAE     Note: THIS ISOLATE HAS BEEN CONFIRMED AS A  [facility] CARBAPENEMASE PRODUCER COLISTIN 0.125ug/mL ETEST results for this drug are "FOR INVESTIGATIONAL USE ONLY" and should NOT be used for clinical purposes. CRITICAL RESULT CALLED TO, READ BACK BY AND VERIFIED      WITH: TIFFANY @ 1:38PM  03/23/13 BY DWEEKS   Report Status 03/23/2013 FINAL   Final   Organism ID, Bacteria KLEBSIELLA PNEUMONIAE   Final  BODY FLUID CULTURE     Status: None   Collection Time    03/23/13  4:46 PM      Result Value Range Status   Specimen Description PLEURAL FLUID RIGHT   Final   Special Requests Immunocompromised   Final   Gram Stain     Final   Value: NO WBC SEEN     NO ORGANISMS SEEN   Culture NO GROWTH 1 DAY   Final   Report Status PENDING   Incomplete     Studies: Dg Chest 2 View  03/22/2013   *RADIOLOGY REPORT*  Clinical Data: Shortness of breath, follow up of effusion  CHEST - 2 VIEW  Comparison: CT chest of 03/21/2013 and chest x-ray of 03/19/2013  Findings: Opacity remains throughout much of the right lung base most consistent with a moderate to large right pleural effusion with atelectasis of the right lower lobe.  A small left pleural effusion remains.  Cardiomegaly is stable and there does appear to be mild pulmonary vascular congestion present.  Deviation of the tracheal air shadow to the right of midline is most consistent with  thyroid goiter with a focus of calcification noted in the lower left neck.  IMPRESSION:  1.  Little change to some increase in volume of the right pleural effusion with right basilar atelectasis. 2.  Tiny left pleural effusion. 3.  Cardiomegaly and pulmonary vascular congestion. 4.  Probable thyroid goiter.  Correlate clinically.   Original Report Authenticated By: Dwyane Dee, M.D.   Dg Chest 2 View  03/19/2013   *RADIOLOGY REPORT*  Clinical Data: Chest and abdominal pain  CHEST - 2 VIEW  Comparison: 03/16/2013  Findings: Moderate cardiac  enlargement is again identified. Airspace consolidation within the superior segment of right lower lobe and right middle lobe is again noted and does not appear significantly changed from previous exam.  The left lung is clear. No pleural effusion is identified.  IMPRESSION:  1.  Persistent consolidation involving the right middle lobe and superior segment of right lower lobe. 2.  Cardiac enlargement.   Original Report Authenticated By: Signa Kell, M.D.   Dg Chest 2 View  03/10/2013   *RADIOLOGY REPORT*  Clinical Data: Weakness and chest pain.  No shortness of breath.  CHEST - 2 VIEW  Comparison: 02/18/2013.  Findings: Cardiac enlargement.  Right base effusion appears increased.  Right basilar atelectasis.  No infiltrate or overt edema.  Degenerative changes thoracic spine.  IMPRESSION: Increased right pleural effusion.   Original Report Authenticated By: Davonna Belling, M.D.   Ct Chest Wo Contrast  03/21/2013   *RADIOLOGY REPORT*  Clinical Data: Shortness of breath  CT CHEST WITHOUT CONTRAST  Technique:  Multidetector CT imaging of the chest was performed following the standard protocol without IV contrast.  Comparison: CT abdomen pelvis 05/22/2012  Findings: The thyroid gland is enlarged, left lobe greater than right. There are several calcifications and multiple nodules within the left thyroid lobe.  One of the calcifications is very dense with calcified and measures 14  mm.  There is some sternal extension of the left lobe the thyroid gland.  Findings are most consistent with thyroid goiter.  There is a moderate to large right pleural effusion and a small left pleural effusion.  These effusions appear simple.  There is moderate cardiomegaly, with dilatation of both atria.  Coronary artery calcifications are present.  There is significant collapse / consolidation of the right lower lobe, overlying the pleural effusion.  There is scattered areas of atelectasis bilaterally.  There are scattered nonpathologically enlarged mediastinal lymph nodes. Sensitivity for the evaluation for hilar lymphadenopathy is decreased without intravenous contrast.  The thoracic aorta is normal in caliber.  There are extensive degenerative changes of the thoracic spine. Thoracic spine vertebral bodies are normal in height and alignment.  Imaging of the upper abdomen demonstrates a moderate degree of abdominal ascites, most prominent adjacent to the liver.  IMPRESSION:  1.  Moderate to large right and small left pleural effusions. There is extensive collapse and/or consolidation of the right lower lobe. 2. Moderate cardiomegaly with coronary artery atherosclerosis. 3.  Findings suggestive of thyroid goiter. 4.  Abdominal ascites.   Original Report Authenticated By: Britta Mccreedy, M.D.   Dg Abd Acute W/chest  03/16/2013   *RADIOLOGY REPORT*  Clinical Data: Shortness breath, abdominal pain, distention.  ACUTE ABDOMEN SERIES (ABDOMEN 2 VIEW & CHEST 1 VIEW)  Comparison: Chest x-ray 03/10/2013.  Abdomen image 10/19/2012.  Findings: There is cardiomegaly with vascular congestion.  Focal right mid and lower lung consolidation with moderate right pleural effusion.  No focal opacity on the left.  Nonobstructive bowel gas pattern.  No free air or organomegaly. Prior cholecystectomy.  Moderate stool burden throughout the colon. No suspicious calcifications.  No acute bony abnormality.  IMPRESSION: Right mid and  lower lung consolidation.  Question pneumonia. Moderate right pleural effusion.  Cardiomegaly, vascular congestion.  Prior cholecystectomy.   Original Report Authenticated By: Charlett Nose, M.D.    Scheduled Meds: . allopurinol  100 mg Oral Daily  . amiodarone  400 mg Oral Daily  . busPIRone  5 mg Oral Q12H  . carvedilol  25 mg Oral BID WC  .  docusate sodium  100 mg Oral BID  . guaiFENesin  600 mg Oral BID  . insulin aspart  0-9 Units Subcutaneous TID WC  . ipratropium  0.5 mg Nebulization TID  . isosorbide-hydrALAZINE  1 tablet Oral BID  . levalbuterol  0.63 mg Nebulization TID  . OxyCODONE  10 mg Oral Q12H  . pantoprazole  40 mg Oral Daily  . polyethylene glycol  17 g Oral BID  . potassium chloride  40 mEq Oral BID  . sodium chloride  3 mL Intravenous Q12H  . torsemide  60 mg Oral BID  . traZODone  50 mg Oral QHS  . warfarin  3 mg Oral ONCE-1800  . Warfarin - Pharmacist Dosing Inpatient   Does not apply q1800   Continuous Infusions:    Principal Problem:   Acute on chronic diastolic heart failure Active Problems:   DM   HYPERTENSION   COPD (chronic obstructive pulmonary disease)   CKD (chronic kidney disease)   Shortness of breath   Chronic a-fib   Warfarin-induced coagulopathy   Hypoxemia   Pleural effusion   Pain in back   Pain in hip   Anxiety state, unspecified   Palliative care encounter    Time spent: >30 minutes   Jewett Mcgann  [facility] Pager [phone]. If 8PM-8AM, please contact night-coverage at [email or web address], password [facility] 03/25/2013, 3:19 PM  LOS: 6 days

## 2013-03-26 LAB — BASIC METABOLIC PANEL
BUN: 64 mg/dL — ABNORMAL HIGH (ref 6–23)
Calcium: 9.7 mg/dL (ref 8.4–10.5)
Creatinine, Ser: 2.42 mg/dL — ABNORMAL HIGH (ref 0.50–1.10)
GFR calc non Af Amer: 17 mL/min — ABNORMAL LOW (ref >90)
Glucose, Bld: 170 mg/dL — ABNORMAL HIGH (ref 70–99)

## 2013-03-26 LAB — GLUCOSE, CAPILLARY
Glucose-Capillary: 201 mg/dL — ABNORMAL HIGH (ref 70–99)
Glucose-Capillary: 167 mg/dL — ABNORMAL HIGH (ref 70–99)

## 2013-03-26 MED ORDER — WARFARIN SODIUM 5 MG PO TABS
5.0000 mg | ORAL_TABLET | Freq: Once | ORAL | Status: AC
  Administered 2013-03-26: 5 mg via ORAL
  Filled 2013-03-26: qty 1

## 2013-03-26 NOTE — Progress Notes (Signed)
ANTICOAGULATION CONSULT NOTE   Pharmacy Consult for coumadin Indication: atrial fibrillation  Allergies  Allergen Reactions  . Morphine Sulfate Other (See Comments)    REACTION: change in personality  . Remeron (Mirtazapine) Other (See Comments)    Altered mental status, lethargy  . Tuna (Fish Allergy) Other (See Comments)    unknown  . Penicillins Rash    Tolerates Ancef.    Patient Measurements: Height: 5\' 3"  (160 cm) Weight: 212 lb (96.163 kg) (bed scale) IBW/kg (Calculated) : 52.4  Vital Signs: Temp: 98.4 F (36.9 C) (05/23 0532) Temp src: Oral (05/23 0532) BP: 105/56 mmHg (05/23 0532) Pulse Rate: 82 (05/23 0532)  Labs:  Recent Labs  03/24/13 0520 03/25/13 0430 03/26/13 0505  LABPROT 20.0* 21.3* 21.8*  INR 1.77* 1.93* 1.99*  CREATININE 2.19* 2.14* 2.42*    Estimated Creatinine Clearance: 19.4 ml/min (by C-G formula based on Cr of 2.42).  Assessment: 79 yo female admitted with SOB and leg swelling noted with HF.  Thoracentesis 5/20 - no complications noted.  INR now trending up but has slowed 1.99 this morning.   No further procedures planned.  Home coumadin dose: 3mg /day  Goal of Therapy:  INR 2-3 Monitor platelets by anticoagulation protocol: Yes   Plan:  warfarin 5mg  tonight then plan to resume 3mg  daily 5/24 INR in am   Sheppard Coil PharmD., [facility] Clinical Pharmacist Pager [phone] 03/26/2013 10:04 AM

## 2013-03-26 NOTE — Progress Notes (Signed)
No change in d/c plan. Will be moved to [facility] when medically stable per MD.  Patient discussed with Dr. Gala Romney- stated that he would re-evaluate patient on Monday.  Notified SNF.  CSW will continue to monitor.  Lorri Frederick. [name]  [phone]

## 2013-03-26 NOTE — Progress Notes (Signed)
Resp Care Note;Pt refusing to wear Bipap tonight ,says she cant sleep with it on.

## 2013-03-26 NOTE — Progress Notes (Signed)
Physical Therapy Treatment Patient Details Name: Gina Ashley MRN: [id number] DOB: [birth year] Today's Date: 03/26/2013 Time: 1191-4782 PT Time Calculation (min): 28 min  PT Assessment / Plan / Recommendation Comments on Treatment Session  Pt ambulate this session with encouragement however pt very excited about her progress at end of session.  Continue to recommend SNF at d/c.    Follow Up Recommendations  SNF     Equipment Recommendations  None recommended by PT    Recommendations for Other Services    Frequency Min 3X/week   Plan Discharge plan remains appropriate;Frequency remains appropriate    Precautions / Restrictions Precautions Precautions: Fall Precaution Comments: LLE upright AFO Restrictions Weight Bearing Restrictions: No   Pertinent Vitals/Pain No c/o pain at this time; vitals maintained    Mobility  Bed Mobility Bed Mobility: Rolling Left;Left Sidelying to Sit Rolling Left: 5: Set up Left Sidelying to Sit: 4: Min assist;With rails;HOB elevated Details for Bed Mobility Assistance: Pt able to roll with use of rail to place bed pan.  Pt needed total to clean after attempt of BM.  (A) to elevate trunk OOB with cues for proper technique. Transfers Transfers: Sit to Stand;Stand to Sit Sit to Stand: 3: Mod assist;From bed;From elevated surface;With upper extremity assist Stand to Sit: 3: Mod assist;With armrests;To chair/3-in-1 Details for Transfer Assistance: (A) to initiate transfer with max cues for hand placement and (A) to slowly descend to recliner Ambulation/Gait Ambulation/Gait Assistance: 1: +2 Total assist Ambulation/Gait: Patient Percentage: 70% Ambulation Distance (Feet): 8 Feet Assistive device: Rolling walker Ambulation/Gait Assistance Details: +2 (A) for safety and chair to follow due to left knee buckling during ambulation and pt attempts to sit without chair behind and in proper position.  Pt needed max encouragement to ambulate further. Gait  Pattern: Step-to pattern;Decreased stance time - left;Left foot flat;Shuffle;Trunk flexed Gait velocity: decreased Stairs: No Wheelchair Mobility Wheelchair Mobility: No    Exercises     PT Diagnosis:    PT Problem List:   PT Treatment Interventions:     PT Goals Acute Rehab PT Goals PT Goal Formulation: With patient Time For Goal Achievement: 04/05/13 Potential to Achieve Goals: Good Pt will go Supine/Side to Sit: with supervision PT Goal: Supine/Side to Sit - Progress: Progressing toward goal Pt will Transfer Bed to Chair/Chair to Bed: with supervision PT Transfer Goal: Bed to Chair/Chair to Bed - Progress: Progressing toward goal Pt will Ambulate: 1 - 15 feet;with min assist;with rolling walker PT Goal: Ambulate - Progress: Progressing toward goal  Visit Information  Last PT Received On: 03/26/13 Assistance Needed: +2 (safety) PT/OT Co-Evaluation/Treatment: Yes Reason Eval/Treat Not Completed: Medical issues which prohibited therapy    Subjective Data  Subjective: "Thats the fartherest I've walked in months." Patient Stated Goal: To walk further   Cognition  Cognition Arousal/Alertness: Awake/alert Behavior During Therapy: WFL for tasks assessed/performed Overall Cognitive Status: Within Functional Limits for tasks assessed    Balance     End of Session PT - End of Session Equipment Utilized During Treatment: Gait belt Activity Tolerance: Patient tolerated treatment well Patient left: in chair;with nursing in room;with call bell/phone within reach Nurse Communication: Mobility status   GP     Izzabelle Bouley 03/26/2013, 11:37 AM Jake Shark, PT DPT [phone]

## 2013-03-26 NOTE — Progress Notes (Signed)
[facility] PROGRESS NOTE  Gina Ashley WUJ:[id number] DOB: August 23, 1927 DOA: 03/19/2013 PCP: Kimber Relic, MD  Assessment/Plan: Principal Problem:   Acute on chronic diastolic heart failure Active Problems:   DM   HYPERTENSION   COPD (chronic obstructive pulmonary disease)   CKD (chronic kidney disease)   Shortness of breath   Chronic a-fib   Warfarin-induced coagulopathy   Hypoxemia   Pleural effusion   Pain in back   Pain in hip   Anxiety state, unspecified   Palliative care encounter    1.Acute on ChronicDiastolic CHF - improved and after thoracentesis breathing much better.  -Will continue demadex 60mg  BID, continue following daily weights -Will continue low sodium diet and continue strict I's and O's. -breathing remains stable; unfortunately patient weight is up and Cr worsen overnight. Will follow cardiology rec's for further adjustments on her torsemide.  2. SOB due to #1   3. COPD -stable.   4. DM2- continue carb modified diet and SSI coverage.  5. HTN-stable. Continue current therapy  6. CKD- Monitor Bun/Cr, baseline CR= 2.4. , Creatinine stable and pretty much at baseline. Will follow trend especially with ongoing diuresis.  7. Chronic Atrial Fibrillation- per cardiology. Continue coumadin, coreg and amiodarone; rate controlled. Will check if patient can afford apixaban and switch anticoagulation at discharge  8. Warfarin Induced Coaguloapthy-INR down and improving. Dose manage by pharmacy. Per cardiology rec's continue coumadin and if patient able to afford apixaban can be switch.  9-Chronic pain: will continue pain meds as recommended by palliative team (on oxycontin and oxycodone)  10-Anxiety: continue PRN xanax.  11-Klebsiella colonization in urine: at this point no signs or symptoms for infections. Will hold on antibiotics.  Code Status: DO NOT RESUSCITATE Family Communication: Daughter updated at bedside about patient's clinical  progress Disposition Plan:  SNF with palliative care following her at discharge    Consultants:  Heart failure team  Pulmonology  Palliative care  Procedures:  None  Antibiotics: None  HPI/Subjective: Patient is afebrile; reports breathing is stable; overall pain is well control. Complaining of bed being uncomfortable. Cr and weight slightly up today.  Objective: Filed Vitals:   03/26/13 0532 03/26/13 0746 03/26/13 1450 03/26/13 1455  BP: 105/56  111/60   Pulse: 82  68   Temp: 98.4 F (36.9 C)  98.3 F (36.8 C)   TempSrc: Oral  Oral   Resp: 20  20   Height:      Weight: 96.163 kg (212 lb)     SpO2: 96% 99% 100% 100%    Intake/Output Summary (Last 24 hours) at 03/26/13 1635 Last data filed at 03/26/13 1500  Gross per 24 hour  Intake    780 ml  Output   1376 ml  Net   -596 ml    Exam:  General: Elderly chronically ill appearing. No resp distress; no CP  HEENT: normal  Neck: supple. No bruits. No lymphadenopathy or thryomegaly appreciated. Positive JVD Cardiology: PMI nondisplaced. Regular rate & rhythm. No rubs, gallops or murmurs.  Lungs: clear bilaterally Abdomen: soft, nontender, nondistended. No hepatosplenomegaly. No bruits or masses. Good bowel sounds.  Extremities: no cyanosis, trace edema bilaterally Neuro: alert & orientedx3, cranial nerves grossly intact. moves all 4 extremities w/o difficulty. Affect pleasant    Data Reviewed: Basic Metabolic Panel:  Recent Labs Lab 03/22/13 0036 03/23/13 0530 03/24/13 0520 03/25/13 0430 03/26/13 0505  NA 134* 140 137 136 134*  K 3.4* 3.7 4.2 4.2 4.9  CL 88* 91* 89* 90*  91*  CO2 39* 42* 36* 37* 38*  GLUCOSE 238* 195* 203* 161* 170*  BUN 64* 57* 60* 58* 64*  CREATININE 2.52* 2.11* 2.19* 2.14* 2.42*  CALCIUM 9.3 9.9 9.6 9.7 9.7    Liver Function Tests:  Recent Labs Lab 03/19/13 2123 03/23/13 1801  AST 18  --   ALT 10  --   ALKPHOS 109  --   BILITOT 0.6  --   PROT 7.7 7.8  ALBUMIN 4.1  --      Recent Labs Lab 03/19/13 2123  LIPASE 20   CBC:  Recent Labs Lab 03/19/13 2123 03/22/13 0036  WBC 5.9 5.0  NEUTROABS 4.5  --   HGB 9.6* 8.9*  HCT 30.5* 27.3*  MCV 86.6 86.9  PLT 104* 104*    Cardiac Enzymes:  Recent Labs Lab 03/21/13 1321 03/21/13 1840 03/22/13 0036  TROPONINI <0.30 <0.30 <0.30   BNP (last 3 results)  Recent Labs  03/10/13 2117 03/13/13 0747 03/19/13 2123  PROBNP 4061.0* 4669.0* 4611.0*   CBG:  Recent Labs Lab 03/25/13 1101 03/25/13 1604 03/25/13 2124 03/26/13 0647 03/26/13 1141  GLUCAP 172* 240* 187* 163* 201*    Recent Results (from the past 240 hour(s))  URINE CULTURE     Status: None   Collection Time    03/20/13  1:33 AM      Result Value Range Status   Specimen Description URINE, CATHETERIZED   Final   Special Requests NONE   Final   Culture  Setup Time 03/20/2013 13:18   Final   Colony Count >=100,000 COLONIES/ML   Final   Culture     Final   Value: KLEBSIELLA PNEUMONIAE     Note: THIS ISOLATE HAS BEEN CONFIRMED AS A  [facility] CARBAPENEMASE PRODUCER COLISTIN 0.125ug/mL ETEST results for this drug are "FOR INVESTIGATIONAL USE ONLY" and should NOT be used for clinical purposes. CRITICAL RESULT CALLED TO, READ BACK BY AND VERIFIED      WITH: TIFFANY @ 1:38PM  03/23/13 BY DWEEKS   Report Status 03/23/2013 FINAL   Final   Organism ID, Bacteria KLEBSIELLA PNEUMONIAE   Final  BODY FLUID CULTURE     Status: None   Collection Time    03/23/13  4:46 PM      Result Value Range Status   Specimen Description PLEURAL FLUID RIGHT   Final   Special Requests Immunocompromised   Final   Gram Stain     Final   Value: NO WBC SEEN     NO ORGANISMS SEEN   Culture NO GROWTH 2 DAYS   Final   Report Status PENDING   Incomplete     Studies: Dg Chest 2 View  03/22/2013   *RADIOLOGY REPORT*  Clinical Data: Shortness of breath, follow up of effusion  CHEST - 2 VIEW  Comparison: CT chest of 03/21/2013 and chest x-ray of 03/19/2013  Findings:  Opacity remains throughout much of the right lung base most consistent with a moderate to large right pleural effusion with atelectasis of the right lower lobe.  A small left pleural effusion remains.  Cardiomegaly is stable and there does appear to be mild pulmonary vascular congestion present.  Deviation of the tracheal air shadow to the right of midline is most consistent with thyroid goiter with a focus of calcification noted in the lower left neck.  IMPRESSION:  1.  Little change to some increase in volume of the right pleural effusion with right basilar atelectasis. 2.  Tiny left pleural effusion.  3.  Cardiomegaly and pulmonary vascular congestion. 4.  Probable thyroid goiter.  Correlate clinically.   Original Report Authenticated By: Dwyane Dee, M.D.   Dg Chest 2 View  03/19/2013   *RADIOLOGY REPORT*  Clinical Data: Chest and abdominal pain  CHEST - 2 VIEW  Comparison: 03/16/2013  Findings: Moderate cardiac enlargement is again identified. Airspace consolidation within the superior segment of right lower lobe and right middle lobe is again noted and does not appear significantly changed from previous exam.  The left lung is clear. No pleural effusion is identified.  IMPRESSION:  1.  Persistent consolidation involving the right middle lobe and superior segment of right lower lobe. 2.  Cardiac enlargement.   Original Report Authenticated By: Signa Kell, M.D.   Dg Chest 2 View  03/10/2013   *RADIOLOGY REPORT*  Clinical Data: Weakness and chest pain.  No shortness of breath.  CHEST - 2 VIEW  Comparison: 02/18/2013.  Findings: Cardiac enlargement.  Right base effusion appears increased.  Right basilar atelectasis.  No infiltrate or overt edema.  Degenerative changes thoracic spine.  IMPRESSION: Increased right pleural effusion.   Original Report Authenticated By: Davonna Belling, M.D.   Ct Chest Wo Contrast  03/21/2013   *RADIOLOGY REPORT*  Clinical Data: Shortness of breath  CT CHEST WITHOUT CONTRAST   Technique:  Multidetector CT imaging of the chest was performed following the standard protocol without IV contrast.  Comparison: CT abdomen pelvis 05/22/2012  Findings: The thyroid gland is enlarged, left lobe greater than right. There are several calcifications and multiple nodules within the left thyroid lobe.  One of the calcifications is very dense with calcified and measures 14 mm.  There is some sternal extension of the left lobe the thyroid gland.  Findings are most consistent with thyroid goiter.  There is a moderate to large right pleural effusion and a small left pleural effusion.  These effusions appear simple.  There is moderate cardiomegaly, with dilatation of both atria.  Coronary artery calcifications are present.  There is significant collapse / consolidation of the right lower lobe, overlying the pleural effusion.  There is scattered areas of atelectasis bilaterally.  There are scattered nonpathologically enlarged mediastinal lymph nodes. Sensitivity for the evaluation for hilar lymphadenopathy is decreased without intravenous contrast.  The thoracic aorta is normal in caliber.  There are extensive degenerative changes of the thoracic spine. Thoracic spine vertebral bodies are normal in height and alignment.  Imaging of the upper abdomen demonstrates a moderate degree of abdominal ascites, most prominent adjacent to the liver.  IMPRESSION:  1.  Moderate to large right and small left pleural effusions. There is extensive collapse and/or consolidation of the right lower lobe. 2. Moderate cardiomegaly with coronary artery atherosclerosis. 3.  Findings suggestive of thyroid goiter. 4.  Abdominal ascites.   Original Report Authenticated By: Britta Mccreedy, M.D.   Dg Abd Acute W/chest  03/16/2013   *RADIOLOGY REPORT*  Clinical Data: Shortness breath, abdominal pain, distention.  ACUTE ABDOMEN SERIES (ABDOMEN 2 VIEW & CHEST 1 VIEW)  Comparison: Chest x-ray 03/10/2013.  Abdomen image 10/19/2012.   Findings: There is cardiomegaly with vascular congestion.  Focal right mid and lower lung consolidation with moderate right pleural effusion.  No focal opacity on the left.  Nonobstructive bowel gas pattern.  No free air or organomegaly. Prior cholecystectomy.  Moderate stool burden throughout the colon. No suspicious calcifications.  No acute bony abnormality.  IMPRESSION: Right mid and lower lung consolidation.  Question pneumonia. Moderate right pleural effusion.  Cardiomegaly, vascular congestion.  Prior cholecystectomy.   Original Report Authenticated By: Charlett Nose, M.D.    Scheduled Meds: . allopurinol  100 mg Oral Daily  . amiodarone  400 mg Oral Daily  . busPIRone  5 mg Oral Q12H  . carvedilol  25 mg Oral BID WC  . docusate sodium  100 mg Oral BID  . guaiFENesin  600 mg Oral BID  . insulin aspart  0-9 Units Subcutaneous TID WC  . ipratropium  0.5 mg Nebulization TID  . isosorbide-hydrALAZINE  1 tablet Oral BID  . levalbuterol  0.63 mg Nebulization TID  . OxyCODONE  10 mg Oral Q12H  . pantoprazole  40 mg Oral Daily  . polyethylene glycol  17 g Oral BID  . potassium chloride  40 mEq Oral BID  . sodium chloride  3 mL Intravenous Q12H  . torsemide  60 mg Oral BID  . traZODone  50 mg Oral QHS  . warfarin  5 mg Oral ONCE-1800  . Warfarin - Pharmacist Dosing Inpatient   Does not apply q1800   Continuous Infusions:    Principal Problem:   Acute on chronic diastolic heart failure Active Problems:   DM   HYPERTENSION   COPD (chronic obstructive pulmonary disease)   CKD (chronic kidney disease)   Shortness of breath   Chronic a-fib   Warfarin-induced coagulopathy   Hypoxemia   Pleural effusion   Pain in back   Pain in hip   Anxiety state, unspecified   Palliative care encounter   Time spent: >30 minutes   Arnold Depinto  [facility] Pager [phone]. If 8PM-8AM, please contact night-coverage at [email or web address], password [facility] 03/26/2013, 4:35 PM  LOS: 7 days

## 2013-03-26 NOTE — Progress Notes (Signed)
Advanced Heart Failure Rounding Note   Subjective:    [name] is an 79 y.o. female with history of chronic atrial fibrillation on coumadin, diastolic heart failure and HTN. She also has DM type 2, COPD, OSA on CPAP, A Fib- chronic coumadin, and CKD failure with baseline Cr 2.2. She has had 5 hospitalizations in the last 6 months, 1 included tibia/fibula fracture, 1 for UTI, and the rest have been due to massive fluid overload in the setting of diastolic heart failure. Per nephrology she is not a candidate for HD.   Admitted March 2014 with massive volume overload. Weight 280 pounds. She required Milrinone and Dopamine. Permanent atrial fib and went for TEE/DCCV on 3/18 but unable to complete due to smoke. Continued on amio for rate control and coumadin. Evaluated by Nephrology due to renal failure and she was not a candidate for HD. Discharged to SNF. Discharge weight 212 pounds.   Discharged from [facility] 03/17/13 after being diuresed with Lasix drip and later transitioned to 60 mg of Torsemide daily. She was discharged back to SNF. Discharge weight 217 pounds.   She presented to [facility] ED 03/20/13 with increased dyspnea and lower extremity edema. Her weight was up a few pounds to 220. She was given 80 mg IV lasix and Metolazone 2.5 mg daily. CT with large R pleural effusion with associate RLL collapse.  Thoracentesis 5/20 1100 clear yellow pleural fluid drained. Palliative meeting earlier this week and they want to continue limited medical interventions for stabilization and symptom control.   Switched from lasix gtt to demadex 60 bid yesterday. I/o - 500. Weight up 1 pound. Stable overnight. Cr 2.14 -> 2.42 (baseline 2.2-2.5)  Denies SOB, orthopnea or CP.   Objective:   Weight Range:  Vital Signs:   Temp:  [97.8 F (36.6 C)-98.4 F (36.9 C)] 98.4 F (36.9 C) (05/23 0532) Pulse Rate:  [82] 82 (05/23 0532) Resp:  [18-20] 20 (05/23 0532) BP: (105-115)/(56-78) 105/56 mmHg (05/23 0532) SpO2:  [96  %-99 %] 99 % (05/23 0746) Weight:  [96.163 kg (212 lb)] 96.163 kg (212 lb) (05/23 0532) Last BM Date: 03/25/13  Weight change: Filed Weights   03/24/13 0640 03/25/13 0658 03/26/13 0532  Weight: 91.7 kg (202 lb 2.6 oz) 95.9 kg (211 lb 6.7 oz) 96.163 kg (212 lb)    Intake/Output:   Intake/Output Summary (Last 24 hours) at 03/26/13 1334 Last data filed at 03/26/13 0941  Gross per 24 hour  Intake    540 ml  Output   1176 ml  Net   -636 ml     Physical Exam: General:  Elderly chronically appearing. No resp difficulty; laying in bed HEENT: normal Neck: supple. JVP 9-10. Carotids 2+ bilat; no bruits. No lymphadenopathy or thryomegaly appreciated. Cor: PMI nondisplaced. Regular rate & irregular rhythm. No rubs, gallops or murmurs. Lungs: clear Abdomen: soft, nontender, mild distention. No hepatosplenomegaly. No bruits or masses. Good bowel sounds. Extremities: no cyanosis, clubbing, rash, trace edema Neuro: alert & orientedx3, cranial nerves grossly intact. moves all 4 extremities w/o difficulty. Affect pleasant  Telemetry: AFib 80-90s  Labs: Basic Metabolic Panel:  Recent Labs Lab 03/22/13 0036 03/23/13 0530 03/24/13 0520 03/25/13 0430 03/26/13 0505  NA 134* 140 137 136 134*  K 3.4* 3.7 4.2 4.2 4.9  CL 88* 91* 89* 90* 91*  CO2 39* 42* 36* 37* 38*  GLUCOSE 238* 195* 203* 161* 170*  BUN 64* 57* 60* 58* 64*  CREATININE 2.52* 2.11* 2.19* 2.14* 2.42*  CALCIUM  9.3 9.9 9.6 9.7 9.7    Liver Function Tests:  Recent Labs Lab 03/19/13 2123 03/23/13 1801  AST 18  --   ALT 10  --   ALKPHOS 109  --   BILITOT 0.6  --   PROT 7.7 7.8  ALBUMIN 4.1  --     Recent Labs Lab 03/19/13 2123  LIPASE 20   No results found for this basename: AMMONIA,  in the last 168 hours  CBC:  Recent Labs Lab 03/19/13 2123 03/22/13 0036  WBC 5.9 5.0  NEUTROABS 4.5  --   HGB 9.6* 8.9*  HCT 30.5* 27.3*  MCV 86.6 86.9  PLT 104* 104*    Cardiac Enzymes:  Recent Labs Lab  03/21/13 1321 03/21/13 1840 03/22/13 0036  TROPONINI <0.30 <0.30 <0.30    BNP: BNP (last 3 results)  Recent Labs  03/10/13 2117 03/13/13 0747 03/19/13 2123  PROBNP 4061.0* 4669.0* 4611.0*     Other results:    Imaging: No results found.   Medications:     Scheduled Medications: . allopurinol  100 mg Oral Daily  . amiodarone  400 mg Oral Daily  . busPIRone  5 mg Oral Q12H  . carvedilol  25 mg Oral BID WC  . docusate sodium  100 mg Oral BID  . guaiFENesin  600 mg Oral BID  . insulin aspart  0-9 Units Subcutaneous TID WC  . ipratropium  0.5 mg Nebulization TID  . isosorbide-hydrALAZINE  1 tablet Oral BID  . levalbuterol  0.63 mg Nebulization TID  . OxyCODONE  10 mg Oral Q12H  . pantoprazole  40 mg Oral Daily  . polyethylene glycol  17 g Oral BID  . potassium chloride  40 mEq Oral BID  . sodium chloride  3 mL Intravenous Q12H  . torsemide  60 mg Oral BID  . traZODone  50 mg Oral QHS  . warfarin  5 mg Oral ONCE-1800  . Warfarin - Pharmacist Dosing Inpatient   Does not apply q1800    Infusions:    PRN Medications: sodium chloride, acetaminophen, ALPRAZolam, menthol-cetylpyridinium, metoCLOPramide (REGLAN) injection, nitroGLYCERIN, ondansetron (ZOFRAN) IV, oxyCODONE, phenol, simethicone, sodium chloride, sodium chloride   Assessment:   1. Acute on chronic diastolic HF  2. R Pleural effusion, large  3.Acute on chronic renal failure. (Creatinine baseline 2.6)  4. h/o HTN with hypertensive heart disease  5. Obesity, suspect OHS/OSA  6. Chronic atrial fib  - on coumadin  7. Deconditioning  8. DNR  9. Cardiorenal syndrome, severe   Plan/Discussion:    She transitioned from IV lasix to torsemide 60 bid yesterday. Weight up slightly but CR jumped 2.1->2.4. Given multiple readmissions for HF I think it is imperative that we demonstrate stability of her weight, volume status and renal function on her oral diuretics prior to d/c or she will come right back as  she has several times recently when we haven't done this. I would be in favor of keeping her throughout the weekend and sending out on Tuesday.  Given high CHADS score would continue anticoagulation. Can switch to apixaban if she can afford.  Appreciate Palliative Care's input.  Length of Stay: 7  Tanashia Ciesla 03/26/2013, 1:34 PM  Advanced Heart Failure Team Pager [phone] (M-F; 7a - 4p)  Please contact [facility] for night-coverage after hours (4p -7a ) and weekends on [email or web address]

## 2013-03-27 DIAGNOSIS — G4733 Obstructive sleep apnea (adult) (pediatric): Secondary | ICD-10-CM

## 2013-03-27 LAB — GLUCOSE, CAPILLARY
Glucose-Capillary: 177 mg/dL — ABNORMAL HIGH (ref 70–99)
Glucose-Capillary: 193 mg/dL — ABNORMAL HIGH (ref 70–99)
Glucose-Capillary: 170 mg/dL — ABNORMAL HIGH (ref 70–99)

## 2013-03-27 LAB — PROTIME-INR
INR: 1.99 — ABNORMAL HIGH (ref 0.00–1.49)
Prothrombin Time: 21.8 seconds — ABNORMAL HIGH (ref 11.6–15.2)

## 2013-03-27 LAB — BASIC METABOLIC PANEL
BUN: 65 mg/dL — ABNORMAL HIGH (ref 6–23)
Calcium: 9.7 mg/dL (ref 8.4–10.5)
Creatinine, Ser: 2.64 mg/dL — ABNORMAL HIGH (ref 0.50–1.10)
GFR calc Af Amer: 18 mL/min — ABNORMAL LOW (ref >90)

## 2013-03-27 MED ORDER — IPRATROPIUM BROMIDE 0.02 % IN SOLN: 0.5000 mg | RESPIRATORY_TRACT | Status: DC | PRN

## 2013-03-27 MED ORDER — LEVALBUTEROL HCL 0.63 MG/3ML IN NEBU: 0.6300 mg | INHALATION_SOLUTION | RESPIRATORY_TRACT | Status: DC | PRN

## 2013-03-27 MED ORDER — WARFARIN SODIUM 5 MG PO TABS
5.0000 mg | ORAL_TABLET | Freq: Once | ORAL | Status: AC
  Administered 2013-03-27: 5 mg via ORAL
  Filled 2013-03-27: qty 1

## 2013-03-27 NOTE — Progress Notes (Signed)
Subjective:    Gina Ashley is an 79 y.o. female with history of chronic atrial fibrillation on coumadin, diastolic heart failure and HTN. She also has DM type 2, COPD, OSA on CPAP, A Fib- chronic coumadin, and CKD failure with baseline Cr 2.2. She has had 5 hospitalizations in the last 6 months, 1 included tibia/fibula fracture, 1 for UTI, and the rest have been due to massive fluid overload in the setting of diastolic heart failure. Per nephrology she is not a candidate for HD.   Admitted March 2014 with massive volume overload. Weight 280 pounds. She required Milrinone and Dopamine. Permanent atrial fib and went for TEE/DCCV on 3/18 but unable to complete due to smoke. Continued on amio for rate control and coumadin. Evaluated by Nephrology due to renal failure and she was not a candidate for HD. Discharged to SNF. Discharge weight 212 pounds.   Discharged from [facility] 03/17/13 after being diuresed with Lasix drip and later transitioned to 60 mg of Torsemide daily. She was discharged back to SNF. Discharge weight 217 pounds.   She presented to [facility] ED 03/20/13 with increased dyspnea and lower extremity edema. Her weight was up a few pounds to 220. She was given 80 mg IV lasix and Metolazone 2.5 mg daily. CT with large R pleural effusion with associate RLL collapse.  Thoracentesis 5/20 1100 clear yellow pleural fluid drained. Palliative meeting earlier this week and they want to continue limited medical interventions for stabilization and symptom control. Stable overnight. Weight up 9lbs (on new bed), 24 hr I/O -600 . Cr 2.14. Denies SOB, orthopnea or CP. Still on lasix gtt, however foley was removed Thursday .Incontinent everywhere. Plans to d/c to [facility] today in the works.  Objective:   Weight Range:  Vital Signs:   Temp:  [98 F (36.7 C)-98.5 F (36.9 C)] 98 F (36.7 C) (05/24 0357) Pulse Rate:  [68-84] 84 (05/24 0357) Resp:  [18-20] 18 (05/24 0357) BP: (111-122)/(60-88) 113/88 mmHg  (05/24 0357) SpO2:  [97 %-100 %] 100 % (05/24 0751) Weight:  [218 lb 9.6 oz (99.156 kg)] 218 lb 9.6 oz (99.156 kg) (05/24 0357) Last BM Date: 03/25/13  Weight change: Filed Weights   03/25/13 0658 03/26/13 0532 03/27/13 0357  Weight: 211 lb 6.7 oz (95.9 kg) 212 lb (96.163 kg) 218 lb 9.6 oz (99.156 kg)    Intake/Output:   Intake/Output Summary (Last 24 hours) at 03/27/13 0907 Last data filed at 03/27/13 0835  Gross per 24 hour  Intake    860 ml  Output    951 ml  Net    -91 ml     Physical Exam: General:  Elderly chronically appearing. No resp difficulty; laying in bed HEENT: normal Neck: supple. JVP 9-10. Carotids 2+ bilat; no bruits. No lymphadenopathy or thryomegaly appreciated. Cor: PMI nondisplaced. Regular rate & irregular rhythm. No rubs, gallops or murmurs. Lungs: clear Abdomen: soft, nontender, mild distention. No hepatosplenomegaly. No bruits or masses. Good bowel sounds. Extremities: no cyanosis, clubbing, rash, trace edema Neuro: alert & orientedx3, cranial nerves grossly intact. moves all 4 extremities w/o difficulty. Affect pleasant Pain to palpation over thoracentesis site  Telemetry: AFib 80-90s  Labs: Basic Metabolic Panel:  Recent Labs Lab 03/23/13 0530 03/24/13 0520 03/25/13 0430 03/26/13 0505 03/27/13 0500  NA 140 137 136 134* 135  K 3.7 4.2 4.2 4.9 5.4*  CL 91* 89* 90* 91* 89*  CO2 42* 36* 37* 38* 39*  GLUCOSE 195* 203* 161* 170* 194*  BUN 57* 60* 58* 64* 65*  CREATININE 2.11* 2.19* 2.14* 2.42* 2.64*  CALCIUM 9.9 9.6 9.7 9.7 9.7    Liver Function Tests:  Recent Labs Lab 03/23/13 1801  PROT 7.8   No results found for this basename: LIPASE, AMYLASE,  in the last 168 hours No results found for this basename: AMMONIA,  in the last 168 hours  CBC:  Recent Labs Lab 03/22/13 0036  WBC 5.0  HGB 8.9*  HCT 27.3*  MCV 86.9  PLT 104*    Cardiac Enzymes:  Recent Labs Lab 03/21/13 1321 03/21/13 1840 03/22/13 0036  TROPONINI  <0.30 <0.30 <0.30    BNP: BNP (last 3 results)  Recent Labs  03/10/13 2117 03/13/13 0747 03/19/13 2123  PROBNP 4061.0* 4669.0* 4611.0*     Other results:    Imaging: No results found.   Medications:     Scheduled Medications: . allopurinol  100 mg Oral Daily  . amiodarone  400 mg Oral Daily  . busPIRone  5 mg Oral Q12H  . carvedilol  25 mg Oral BID WC  . docusate sodium  100 mg Oral BID  . guaiFENesin  600 mg Oral BID  . insulin aspart  0-9 Units Subcutaneous TID WC  . ipratropium  0.5 mg Nebulization TID  . isosorbide-hydrALAZINE  1 tablet Oral BID  . levalbuterol  0.63 mg Nebulization TID  . OxyCODONE  10 mg Oral Q12H  . pantoprazole  40 mg Oral Daily  . polyethylene glycol  17 g Oral BID  . potassium chloride  40 mEq Oral BID  . sodium chloride  3 mL Intravenous Q12H  . torsemide  60 mg Oral BID  . traZODone  50 mg Oral QHS  . Warfarin - Pharmacist Dosing Inpatient   Does not apply q1800    Infusions:    PRN Medications: sodium chloride, acetaminophen, ALPRAZolam, menthol-cetylpyridinium, metoCLOPramide (REGLAN) injection, nitroGLYCERIN, ondansetron (ZOFRAN) IV, oxyCODONE, phenol, simethicone, sodium chloride, sodium chloride   Assessment:   1. Acute on chronic diastolic HF  2. R Pleural effusion, large  3.Acute on chronic renal failure. (Creatinine baseline 2.6)  4. h/o HTN with hypertensive heart disease  5. Obesity, suspect OHS/OSA  6. Chronic atrial fib  - on coumadin  7. Deconditioning  8. DNR  9. Cardiorenal syndrome, severe   Plan/Discussion:    Her primary issue this am is pain at thoracentesis site. There is no echymosis or hematoma.  She has edema Of the lower abdominal wall from CHF but no evidence of occult bleeding.  Suspect she may have had some  Rib/nerve irritation from procedure.  Pain meds per primary service.  Continue torsemide and follow labs  [employer]

## 2013-03-27 NOTE — Progress Notes (Signed)
ANTICOAGULATION CONSULT NOTE   Pharmacy Consult for coumadin Indication: atrial fibrillation  Allergies  Allergen Reactions  . Morphine Sulfate Other (See Comments)    REACTION: change in personality  . Remeron (Mirtazapine) Other (See Comments)    Altered mental status, lethargy  . Tuna (Fish Allergy) Other (See Comments)    unknown  . Penicillins Rash    Tolerates Ancef.    Patient Measurements: Height: 5\' 3"  (160 cm) Weight: 218 lb 9.6 oz (99.156 kg) IBW/kg (Calculated) : 52.4  Vital Signs: Temp: 98 F (36.7 C) (05/24 0357) Temp src: Oral (05/24 0357) BP: 113/88 mmHg (05/24 0357) Pulse Rate: 84 (05/24 0357)  Labs:  Recent Labs  03/25/13 0430 03/26/13 0505 03/27/13 0500  LABPROT 21.3* 21.8* 21.8*  INR 1.93* 1.99* 1.99*  CREATININE 2.14* 2.42* 2.64*    Estimated Creatinine Clearance: 18.1 ml/min (by C-G formula based on Cr of 2.64).  Assessment: 79 yo female admitted with SOB and leg swelling noted with HF.  Thoracentesis 5/20 - no complications noted.  INR didn't move today, remains at 1.99. No bleeding noted.  Home coumadin dose: 3mg /day  Goal of Therapy:  INR 2-3 Monitor platelets by anticoagulation protocol: Yes   Plan:  Warfarin 5 mg x1 tonight Daily INR.   Doris Cheadle, PharmD Clinical Pharmacist Pager: [phone] Phone: [phone] 03/27/2013 10:35 AM

## 2013-03-27 NOTE — Progress Notes (Signed)
[facility] PROGRESS NOTE  Gina Ashley ZOX:[id number] DOB: May 21, 1927 DOA: 03/19/2013 PCP: Kimber Relic, MD  Assessment/Plan: Principal Problem:   Acute on chronic diastolic heart failure Active Problems:   DM   HYPERTENSION   COPD (chronic obstructive pulmonary disease)   CKD (chronic kidney disease)   Shortness of breath   Chronic a-fib   Warfarin-induced coagulopathy   Hypoxemia   Pleural effusion   Pain in back   Pain in hip   Anxiety state, unspecified   Palliative care encounter    1.Acute on ChronicDiastolic CHF - improved and after thoracentesis breathing much better.  -Will continue demadex 60mg  BID, continue following daily weights -Will continue low sodium diet and continue strict I's and O's. -breathing remains stable; unfortunately patient weight is up and Cr up 2.6 today. Will follow cardiology rec's for further adjustments on her torsemide.  2. SOB due to #1; improved.  3. COPD -stable.   4. DM2- continue carb modified diet and SSI coverage.  5. HTN-stable. Continue current therapy  6. CKD- Monitor Bun/Cr, baseline CR= 2.6. , Creatinine stable and pretty much at baseline. Will follow trend especially with ongoing diuresis.  7. Chronic Atrial Fibrillation- per cardiology. Continue coumadin, coreg and amiodarone; rate controlled.   8. Warfarin Induced Coaguloapthy-INR down and improving. Dose manage by pharmacy. Per cardiology rec's continue coumadin and if patient able to afford apixaban can be switch.  9-Chronic pain: will continue pain meds as recommended by palliative team (on oxycontin and oxycodone)  10-Anxiety: continue PRN xanax.  11-Klebsiella colonization in urine: at this point no signs or symptoms for infections. Will hold on antibiotics tx.  Code Status: DO NOT RESUSCITATE Family Communication: Daughter updated at bedside about patient's clinical progress Disposition Plan:  SNF with palliative care following her at discharge     Consultants:  Heart failure team  Pulmonology  Palliative care  Procedures:  None  Antibiotics: None  HPI/Subjective: Patient is afebrile; reports breathing is stable; overall pain is well control. Complaining of bed being uncomfortable. Cr up to 2.6 (pre records Cr fluctuates between 1.8-2.6; especially with diuresis).  Objective: Filed Vitals:   03/27/13 0357 03/27/13 0751 03/27/13 1100 03/27/13 1315  BP: 113/88   111/59  Pulse: 84   69  Temp: 98 F (36.7 C)   97.9 F (36.6 C)  TempSrc: Oral   Oral  Resp: 18   20  Height:      Weight: 99.156 kg (218 lb 9.6 oz)  98.4 kg (216 lb 14.9 oz)   SpO2: 97% 100%  97%    Intake/Output Summary (Last 24 hours) at 03/27/13 1553 Last data filed at 03/27/13 1500  Gross per 24 hour  Intake    620 ml  Output   1676 ml  Net  -1056 ml    Exam:  General: Elderly chronically ill appearing. No resp distress; no CP  HEENT: normal  Neck: supple. No bruits. No lymphadenopathy or thryomegaly appreciated. Positive JVD Cardiology: PMI nondisplaced. Regular rate & rhythm. No rubs, gallops or murmurs.  Lungs: clear bilaterally Abdomen: soft, nontender, nondistended. No hepatosplenomegaly. No bruits or masses. Good bowel sounds.  Extremities: no cyanosis, trace edema bilaterally Neuro: alert & orientedx3, cranial nerves grossly intact. moves all 4 extremities w/o difficulty. Affect pleasant    Data Reviewed: Basic Metabolic Panel:  Recent Labs Lab 03/23/13 0530 03/24/13 0520 03/25/13 0430 03/26/13 0505 03/27/13 0500  NA 140 137 136 134* 135  K 3.7 4.2 4.2 4.9 5.4*  CL  91* 89* 90* 91* 89*  CO2 42* 36* 37* 38* 39*  GLUCOSE 195* 203* 161* 170* 194*  BUN 57* 60* 58* 64* 65*  CREATININE 2.11* 2.19* 2.14* 2.42* 2.64*  CALCIUM 9.9 9.6 9.7 9.7 9.7    Liver Function Tests:  Recent Labs Lab 03/23/13 1801  PROT 7.8   CBC:  Recent Labs Lab 03/22/13 0036  WBC 5.0  HGB 8.9*  HCT 27.3*  MCV 86.9  PLT 104*     Cardiac Enzymes:  Recent Labs Lab 03/21/13 1321 03/21/13 1840 03/22/13 0036  TROPONINI <0.30 <0.30 <0.30   BNP (last 3 results)  Recent Labs  03/10/13 2117 03/13/13 0747 03/19/13 2123  PROBNP 4061.0* 4669.0* 4611.0*   CBG:  Recent Labs Lab 03/26/13 1141 03/26/13 1644 03/26/13 2118 03/27/13 0555 03/27/13 1121  GLUCAP 201* 167* 193* 188* 177*    Recent Results (from the past 240 hour(s))  URINE CULTURE     Status: None   Collection Time    03/20/13  1:33 AM      Result Value Range Status   Specimen Description URINE, CATHETERIZED   Final   Special Requests NONE   Final   Culture  Setup Time 03/20/2013 13:18   Final   Colony Count >=100,000 COLONIES/ML   Final   Culture     Final   Value: KLEBSIELLA PNEUMONIAE     Note: THIS ISOLATE HAS BEEN CONFIRMED AS A  [facility] CARBAPENEMASE PRODUCER COLISTIN 0.125ug/mL ETEST results for this drug are "FOR INVESTIGATIONAL USE ONLY" and should NOT be used for clinical purposes. CRITICAL RESULT CALLED TO, READ BACK BY AND VERIFIED      WITH: TIFFANY @ 1:38PM  03/23/13 BY DWEEKS   Report Status 03/23/2013 FINAL   Final   Organism ID, Bacteria KLEBSIELLA PNEUMONIAE   Final  BODY FLUID CULTURE     Status: None   Collection Time    03/23/13  4:46 PM      Result Value Range Status   Specimen Description PLEURAL FLUID RIGHT   Final   Special Requests Immunocompromised   Final   Gram Stain     Final   Value: NO WBC SEEN     NO ORGANISMS SEEN   Culture NO GROWTH 2 DAYS   Final   Report Status PENDING   Incomplete     Studies: Dg Chest 2 View  03/22/2013   *RADIOLOGY REPORT*  Clinical Data: Shortness of breath, follow up of effusion  CHEST - 2 VIEW  Comparison: CT chest of 03/21/2013 and chest x-ray of 03/19/2013  Findings: Opacity remains throughout much of the right lung base most consistent with a moderate to large right pleural effusion with atelectasis of the right lower lobe.  A small left pleural effusion remains.   Cardiomegaly is stable and there does appear to be mild pulmonary vascular congestion present.  Deviation of the tracheal air shadow to the right of midline is most consistent with thyroid goiter with a focus of calcification noted in the lower left neck.  IMPRESSION:  1.  Little change to some increase in volume of the right pleural effusion with right basilar atelectasis. 2.  Tiny left pleural effusion. 3.  Cardiomegaly and pulmonary vascular congestion. 4.  Probable thyroid goiter.  Correlate clinically.   Original Report Authenticated By: Dwyane Dee, M.D.   Dg Chest 2 View  03/19/2013   *RADIOLOGY REPORT*  Clinical Data: Chest and abdominal pain  CHEST - 2 VIEW  Comparison: 03/16/2013  Findings:  Moderate cardiac enlargement is again identified. Airspace consolidation within the superior segment of right lower lobe and right middle lobe is again noted and does not appear significantly changed from previous exam.  The left lung is clear. No pleural effusion is identified.  IMPRESSION:  1.  Persistent consolidation involving the right middle lobe and superior segment of right lower lobe. 2.  Cardiac enlargement.   Original Report Authenticated By: Signa Kell, M.D.   Dg Chest 2 View  03/10/2013   *RADIOLOGY REPORT*  Clinical Data: Weakness and chest pain.  No shortness of breath.  CHEST - 2 VIEW  Comparison: 02/18/2013.  Findings: Cardiac enlargement.  Right base effusion appears increased.  Right basilar atelectasis.  No infiltrate or overt edema.  Degenerative changes thoracic spine.  IMPRESSION: Increased right pleural effusion.   Original Report Authenticated By: Davonna Belling, M.D.   Ct Chest Wo Contrast  03/21/2013   *RADIOLOGY REPORT*  Clinical Data: Shortness of breath  CT CHEST WITHOUT CONTRAST  Technique:  Multidetector CT imaging of the chest was performed following the standard protocol without IV contrast.  Comparison: CT abdomen pelvis 05/22/2012  Findings: The thyroid gland is enlarged, left  lobe greater than right. There are several calcifications and multiple nodules within the left thyroid lobe.  One of the calcifications is very dense with calcified and measures 14 mm.  There is some sternal extension of the left lobe the thyroid gland.  Findings are most consistent with thyroid goiter.  There is a moderate to large right pleural effusion and a small left pleural effusion.  These effusions appear simple.  There is moderate cardiomegaly, with dilatation of both atria.  Coronary artery calcifications are present.  There is significant collapse / consolidation of the right lower lobe, overlying the pleural effusion.  There is scattered areas of atelectasis bilaterally.  There are scattered nonpathologically enlarged mediastinal lymph nodes. Sensitivity for the evaluation for hilar lymphadenopathy is decreased without intravenous contrast.  The thoracic aorta is normal in caliber.  There are extensive degenerative changes of the thoracic spine. Thoracic spine vertebral bodies are normal in height and alignment.  Imaging of the upper abdomen demonstrates a moderate degree of abdominal ascites, most prominent adjacent to the liver.  IMPRESSION:  1.  Moderate to large right and small left pleural effusions. There is extensive collapse and/or consolidation of the right lower lobe. 2. Moderate cardiomegaly with coronary artery atherosclerosis. 3.  Findings suggestive of thyroid goiter. 4.  Abdominal ascites.   Original Report Authenticated By: Britta Mccreedy, M.D.   Dg Abd Acute W/chest  03/16/2013   *RADIOLOGY REPORT*  Clinical Data: Shortness breath, abdominal pain, distention.  ACUTE ABDOMEN SERIES (ABDOMEN 2 VIEW & CHEST 1 VIEW)  Comparison: Chest x-ray 03/10/2013.  Abdomen image 10/19/2012.  Findings: There is cardiomegaly with vascular congestion.  Focal right mid and lower lung consolidation with moderate right pleural effusion.  No focal opacity on the left.  Nonobstructive bowel gas pattern.  No  free air or organomegaly. Prior cholecystectomy.  Moderate stool burden throughout the colon. No suspicious calcifications.  No acute bony abnormality.  IMPRESSION: Right mid and lower lung consolidation.  Question pneumonia. Moderate right pleural effusion.  Cardiomegaly, vascular congestion.  Prior cholecystectomy.   Original Report Authenticated By: Charlett Nose, M.D.    Scheduled Meds: . allopurinol  100 mg Oral Daily  . amiodarone  400 mg Oral Daily  . busPIRone  5 mg Oral Q12H  . carvedilol  25 mg Oral BID WC  .  docusate sodium  100 mg Oral BID  . guaiFENesin  600 mg Oral BID  . insulin aspart  0-9 Units Subcutaneous TID WC  . isosorbide-hydrALAZINE  1 tablet Oral BID  . OxyCODONE  10 mg Oral Q12H  . pantoprazole  40 mg Oral Daily  . polyethylene glycol  17 g Oral BID  . sodium chloride  3 mL Intravenous Q12H  . torsemide  60 mg Oral BID  . traZODone  50 mg Oral QHS  . warfarin  5 mg Oral ONCE-1800  . Warfarin - Pharmacist Dosing Inpatient   Does not apply q1800   Continuous Infusions:    Principal Problem:   Acute on chronic diastolic heart failure Active Problems:   DM   HYPERTENSION   COPD (chronic obstructive pulmonary disease)   CKD (chronic kidney disease)   Shortness of breath   Chronic a-fib   Warfarin-induced coagulopathy   Hypoxemia   Pleural effusion   Pain in back   Pain in hip   Anxiety state, unspecified   Palliative care encounter   Time spent: >30 minutes   Elizabelle Fite  [facility] Pager [phone]. If 8PM-8AM, please contact night-coverage at [email or web address], password [facility] 03/27/2013, 3:53 PM  LOS: 8 days

## 2013-03-28 LAB — BASIC METABOLIC PANEL
BUN: 64 mg/dL — ABNORMAL HIGH (ref 6–23)
Calcium: 9.8 mg/dL (ref 8.4–10.5)
GFR calc Af Amer: 18 mL/min — ABNORMAL LOW (ref >90)
GFR calc non Af Amer: 16 mL/min — ABNORMAL LOW (ref >90)
Potassium: 4.2 mEq/L (ref 3.5–5.1)
Sodium: 134 mEq/L — ABNORMAL LOW (ref 135–145)

## 2013-03-28 LAB — PROTIME-INR: Prothrombin Time: 23.4 seconds — ABNORMAL HIGH (ref 11.6–15.2)

## 2013-03-28 LAB — GLUCOSE, CAPILLARY
Glucose-Capillary: 132 mg/dL — ABNORMAL HIGH (ref 70–99)
Glucose-Capillary: 167 mg/dL — ABNORMAL HIGH (ref 70–99)

## 2013-03-28 MED ORDER — WARFARIN SODIUM 5 MG PO TABS
5.0000 mg | ORAL_TABLET | Freq: Once | ORAL | Status: AC
  Administered 2013-03-28: 5 mg via ORAL
  Filled 2013-03-28: qty 1

## 2013-03-28 NOTE — Progress Notes (Signed)
Asked to see pt to determine coverage of Eliquis.  According to facesheet, pt has Medicaid that pays for her medicine.  The preferred drug list for Medicaid indicates that Eliquis is on the list of nonpreferred medications and thus, pt would not be able to fill using her coverage. The list of preferred medications is:  Coumadin, Jantoven, Pradaxa, Warfarin and Xarelto.  In order to use any other drug, at least 2 of the preferred meds would have to be used and documented to have failed.  Consulting cardiologist, Dr. Eden Emms, was standing in the hall so I updated him with this info.  He reviewed the list of preferred medications and felt that Xarelto would be appropriate for the patient.  I called attending MD, Dr. Gwenlyn Perking and advised him of all of the above.

## 2013-03-28 NOTE — Progress Notes (Signed)
ANTICOAGULATION CONSULT NOTE   Pharmacy Consult for coumadin Indication: atrial fibrillation  Allergies  Allergen Reactions  . Morphine Sulfate Other (See Comments)    REACTION: change in personality  . Remeron (Mirtazapine) Other (See Comments)    Altered mental status, lethargy  . Tuna (Fish Allergy) Other (See Comments)    unknown  . Penicillins Rash    Tolerates Ancef.    Patient Measurements: Height: 5\' 3"  (160 cm) Weight: 216 lb 0.8 oz (98 kg) IBW/kg (Calculated) : 52.4  Vital Signs: Temp: 98 F (36.7 C) (05/25 0500) Temp src: Oral (05/25 0500) BP: 111/50 mmHg (05/25 0500) Pulse Rate: 69 (05/25 0500)  Labs:  Recent Labs  03/26/13 0505 03/27/13 0500 03/28/13 0412  LABPROT 21.8* 21.8* 23.4*  INR 1.99* 1.99* 2.19*  CREATININE 2.42* 2.64* 2.61*    Estimated Creatinine Clearance: 18.2 ml/min (by C-G formula based on Cr of 2.61).  Assessment: 79 yo female admitted with SOB and leg swelling noted with HF.  Thoracentesis 5/20 - no complications noted.  INR therapeutic at 2.19 today. No bleeding noted.  Home coumadin dose: 3mg /day  Goal of Therapy:  INR 2-3 Monitor platelets by anticoagulation protocol: Yes   Plan:  Warfarin 5 mg x1 tonight Daily INR   Doris Cheadle, PharmD Clinical Pharmacist Pager: [phone] Phone: [phone] 03/28/2013 10:58 AM

## 2013-03-28 NOTE — Progress Notes (Signed)
[facility] PROGRESS NOTE  Gina Ashley ZOX:[id number] DOB: 08/03/1927 DOA: 03/19/2013 PCP: Kimber Relic, MD  Assessment/Plan: Principal Problem:   Acute on chronic diastolic heart failure Active Problems:   DM   HYPERTENSION   COPD (chronic obstructive pulmonary disease)   CKD (chronic kidney disease)   Shortness of breath   Chronic a-fib   Warfarin-induced coagulopathy   Hypoxemia   Pleural effusion   Pain in back   Pain in hip   Anxiety state, unspecified   Palliative care encounter    1.Acute on ChronicDiastolic CHF - improved and after thoracentesis breathing much better.  -Will continue demadex 60mg  BID, continue following daily weights -Will continue low sodium diet and continue strict I's and O's. -breathing remains stable; unfortunately patient weight is up and Cr up 2.6 today. Will follow cardiology rec's for further adjustments on her torsemide.  2. SOB due to #1; improved.  3. COPD -stable.   4. DM2- continue carb modified diet and SSI coverage.  5. HTN-stable. Continue current therapy  6. CKD- Monitor Bun/Cr, baseline CR= 2.6. , Creatinine stable and pretty much at baseline. Will follow trend especially with ongoing diuresis.  7. Chronic Atrial Fibrillation- per cardiology. Continue coumadin, coreg and amiodarone; rate controlled.   8. Warfarin Induced Coaguloapthy-INR down and improving. Dose manage by pharmacy. Per cardiology rec's continue coumadin and if patient able to afford apixaban can be switch.  9-Chronic pain: will continue pain meds as recommended by palliative team (on oxycontin and oxycodone)  10-Anxiety: continue PRN xanax.  11-Klebsiella colonization in urine: at this point no signs or symptoms for infections. Will hold on antibiotics tx.  Code Status: DO NOT RESUSCITATE Family Communication: Daughter updated at bedside about patient's clinical progress Disposition Plan:  SNF with palliative care following her at discharge     Consultants:  Heart failure team  Pulmonology  Palliative care  Procedures:  None  Antibiotics: None  HPI/Subjective: Patient is afebrile; reports breathing is stable; overall pain is well control. Complaining of bed being uncomfortable. Cr up to 2.6. No CP; has been out of bed and working with PT.  Objective: Filed Vitals:   03/27/13 1315 03/27/13 1959 03/28/13 0500 03/28/13 1505  BP: 111/59 124/79 111/50 106/71  Pulse: 69 83 69 70  Temp: 97.9 F (36.6 C) 98.2 F (36.8 C) 98 F (36.7 C) 97.8 F (36.6 C)  TempSrc: Oral Oral Oral Oral  Resp: 20 18 20 18   Height:      Weight:   98 kg (216 lb 0.8 oz)   SpO2: 97% 98% 97% 99%    Intake/Output Summary (Last 24 hours) at 03/28/13 1813 Last data filed at 03/28/13 1808  Gross per 24 hour  Intake   1080 ml  Output   1775 ml  Net   -695 ml    Exam:  General: Elderly chronically ill appearing. No resp distress; no CP  HEENT: normal  Neck: supple. No bruits. No lymphadenopathy or thryomegaly appreciated. Positive JVD Cardiology: PMI nondisplaced. Regular rate & rhythm. No rubs, gallops or murmurs.  Lungs: clear bilaterally Abdomen: soft, nontender, nondistended. No hepatosplenomegaly. No bruits or masses. Good bowel sounds.  Extremities: no cyanosis, trace edema bilaterally Neuro: alert & orientedx3, cranial nerves grossly intact. moves all 4 extremities w/o difficulty. Affect pleasant    Data Reviewed: Basic Metabolic Panel:  Recent Labs Lab 03/24/13 0520 03/25/13 0430 03/26/13 0505 03/27/13 0500 03/28/13 0412  NA 137 136 134* 135 134*  K 4.2 4.2 4.9 5.4*  4.2  CL 89* 90* 91* 89* 89*  CO2 36* 37* 38* 39* 36*  GLUCOSE 203* 161* 170* 194* 157*  BUN 60* 58* 64* 65* 64*  CREATININE 2.19* 2.14* 2.42* 2.64* 2.61*  CALCIUM 9.6 9.7 9.7 9.7 9.8    Liver Function Tests:  Recent Labs Lab 03/23/13 1801  PROT 7.8   CBC:  Recent Labs Lab 03/22/13 0036  WBC 5.0  HGB 8.9*  HCT 27.3*  MCV 86.9  PLT  104*    Cardiac Enzymes:  Recent Labs Lab 03/21/13 1840 03/22/13 0036  TROPONINI <0.30 <0.30   BNP (last 3 results)  Recent Labs  03/10/13 2117 03/13/13 0747 03/19/13 2123  PROBNP 4061.0* 4669.0* 4611.0*   CBG:  Recent Labs Lab 03/27/13 1604 03/27/13 2120 03/28/13 0635 03/28/13 1122 03/28/13 1703  GLUCAP 193* 170* 132* 175* 156*    Recent Results (from the past 240 hour(s))  URINE CULTURE     Status: None   Collection Time    03/20/13  1:33 AM      Result Value Range Status   Specimen Description URINE, CATHETERIZED   Final   Special Requests NONE   Final   Culture  Setup Time 03/20/2013 13:18   Final   Colony Count >=100,000 COLONIES/ML   Final   Culture     Final   Value: KLEBSIELLA PNEUMONIAE     Note: THIS ISOLATE HAS BEEN CONFIRMED AS A  [facility] CARBAPENEMASE PRODUCER COLISTIN 0.125ug/mL ETEST results for this drug are "FOR INVESTIGATIONAL USE ONLY" and should NOT be used for clinical purposes. CRITICAL RESULT CALLED TO, READ BACK BY AND VERIFIED      WITH: TIFFANY @ 1:38PM  03/23/13 BY DWEEKS   Report Status 03/23/2013 FINAL   Final   Organism ID, Bacteria KLEBSIELLA PNEUMONIAE   Final  BODY FLUID CULTURE     Status: None   Collection Time    03/23/13  4:46 PM      Result Value Range Status   Specimen Description PLEURAL FLUID RIGHT   Final   Special Requests Immunocompromised   Final   Gram Stain     Final   Value: NO WBC SEEN     NO ORGANISMS SEEN   Culture NO GROWTH 3 DAYS   Final   Report Status 03/27/2013 FINAL   Final     Studies: Dg Chest 2 View  03/22/2013   *RADIOLOGY REPORT*  Clinical Data: Shortness of breath, follow up of effusion  CHEST - 2 VIEW  Comparison: CT chest of 03/21/2013 and chest x-ray of 03/19/2013  Findings: Opacity remains throughout much of the right lung base most consistent with a moderate to large right pleural effusion with atelectasis of the right lower lobe.  A small left pleural effusion remains.  Cardiomegaly is stable  and there does appear to be mild pulmonary vascular congestion present.  Deviation of the tracheal air shadow to the right of midline is most consistent with thyroid goiter with a focus of calcification noted in the lower left neck.  IMPRESSION:  1.  Little change to some increase in volume of the right pleural effusion with right basilar atelectasis. 2.  Tiny left pleural effusion. 3.  Cardiomegaly and pulmonary vascular congestion. 4.  Probable thyroid goiter.  Correlate clinically.   Original Report Authenticated By: Dwyane Dee, M.D.   Dg Chest 2 View  03/19/2013   *RADIOLOGY REPORT*  Clinical Data: Chest and abdominal pain  CHEST - 2 VIEW  Comparison: 03/16/2013  Findings: Moderate cardiac enlargement is again identified. Airspace consolidation within the superior segment of right lower lobe and right middle lobe is again noted and does not appear significantly changed from previous exam.  The left lung is clear. No pleural effusion is identified.  IMPRESSION:  1.  Persistent consolidation involving the right middle lobe and superior segment of right lower lobe. 2.  Cardiac enlargement.   Original Report Authenticated By: Signa Kell, M.D.   Dg Chest 2 View  03/10/2013   *RADIOLOGY REPORT*  Clinical Data: Weakness and chest pain.  No shortness of breath.  CHEST - 2 VIEW  Comparison: 02/18/2013.  Findings: Cardiac enlargement.  Right base effusion appears increased.  Right basilar atelectasis.  No infiltrate or overt edema.  Degenerative changes thoracic spine.  IMPRESSION: Increased right pleural effusion.   Original Report Authenticated By: Davonna Belling, M.D.   Ct Chest Wo Contrast  03/21/2013   *RADIOLOGY REPORT*  Clinical Data: Shortness of breath  CT CHEST WITHOUT CONTRAST  Technique:  Multidetector CT imaging of the chest was performed following the standard protocol without IV contrast.  Comparison: CT abdomen pelvis 05/22/2012  Findings: The thyroid gland is enlarged, left lobe greater than  right. There are several calcifications and multiple nodules within the left thyroid lobe.  One of the calcifications is very dense with calcified and measures 14 mm.  There is some sternal extension of the left lobe the thyroid gland.  Findings are most consistent with thyroid goiter.  There is a moderate to large right pleural effusion and a small left pleural effusion.  These effusions appear simple.  There is moderate cardiomegaly, with dilatation of both atria.  Coronary artery calcifications are present.  There is significant collapse / consolidation of the right lower lobe, overlying the pleural effusion.  There is scattered areas of atelectasis bilaterally.  There are scattered nonpathologically enlarged mediastinal lymph nodes. Sensitivity for the evaluation for hilar lymphadenopathy is decreased without intravenous contrast.  The thoracic aorta is normal in caliber.  There are extensive degenerative changes of the thoracic spine. Thoracic spine vertebral bodies are normal in height and alignment.  Imaging of the upper abdomen demonstrates a moderate degree of abdominal ascites, most prominent adjacent to the liver.  IMPRESSION:  1.  Moderate to large right and small left pleural effusions. There is extensive collapse and/or consolidation of the right lower lobe. 2. Moderate cardiomegaly with coronary artery atherosclerosis. 3.  Findings suggestive of thyroid goiter. 4.  Abdominal ascites.   Original Report Authenticated By: Britta Mccreedy, M.D.   Dg Abd Acute W/chest  03/16/2013   *RADIOLOGY REPORT*  Clinical Data: Shortness breath, abdominal pain, distention.  ACUTE ABDOMEN SERIES (ABDOMEN 2 VIEW & CHEST 1 VIEW)  Comparison: Chest x-ray 03/10/2013.  Abdomen image 10/19/2012.  Findings: There is cardiomegaly with vascular congestion.  Focal right mid and lower lung consolidation with moderate right pleural effusion.  No focal opacity on the left.  Nonobstructive bowel gas pattern.  No free air or  organomegaly. Prior cholecystectomy.  Moderate stool burden throughout the colon. No suspicious calcifications.  No acute bony abnormality.  IMPRESSION: Right mid and lower lung consolidation.  Question pneumonia. Moderate right pleural effusion.  Cardiomegaly, vascular congestion.  Prior cholecystectomy.   Original Report Authenticated By: Charlett Nose, M.D.    Scheduled Meds: . allopurinol  100 mg Oral Daily  . amiodarone  400 mg Oral Daily  . busPIRone  5 mg Oral Q12H  . carvedilol  25 mg Oral BID  WC  . docusate sodium  100 mg Oral BID  . guaiFENesin  600 mg Oral BID  . insulin aspart  0-9 Units Subcutaneous TID WC  . isosorbide-hydrALAZINE  1 tablet Oral BID  . OxyCODONE  10 mg Oral Q12H  . pantoprazole  40 mg Oral Daily  . polyethylene glycol  17 g Oral BID  . sodium chloride  3 mL Intravenous Q12H  . torsemide  60 mg Oral BID  . traZODone  50 mg Oral QHS  . Warfarin - Pharmacist Dosing Inpatient   Does not apply q1800   Continuous Infusions:    Principal Problem:   Acute on chronic diastolic heart failure Active Problems:   DM   HYPERTENSION   COPD (chronic obstructive pulmonary disease)   CKD (chronic kidney disease)   Shortness of breath   Chronic a-fib   Warfarin-induced coagulopathy   Hypoxemia   Pleural effusion   Pain in back   Pain in hip   Anxiety state, unspecified   Palliative care encounter   Time spent: >30 minutes   Deronte Solis  [facility] Pager [phone]. If 8PM-8AM, please contact night-coverage at [email or web address], password [facility] 03/28/2013, 6:13 PM  LOS: 9 days

## 2013-03-28 NOTE — Progress Notes (Signed)
Patient ID: Gina Ashley, female   DOB: [birth year], 79 y.o.   MRN: [id number]     Subjective:    Gina Ashley is an 79 y.o. female with history of chronic atrial fibrillation on coumadin, diastolic heart failure and HTN. She also has DM type 2, COPD, OSA on CPAP, A Fib- chronic coumadin, and CKD failure with baseline Cr 2.2. She has had 5 hospitalizations in the last 6 months, 1 included tibia/fibula fracture, 1 for UTI, and the rest have been due to massive fluid overload in the setting of diastolic heart failure. Per nephrology she is not a candidate for HD.   Admitted March 2014 with massive volume overload. Weight 280 pounds. She required Milrinone and Dopamine. Permanent atrial fib and went for TEE/DCCV on 3/18 but unable to complete due to smoke. Continued on amio for rate control and coumadin. Evaluated by Nephrology due to renal failure and she was not a candidate for HD. Discharged to SNF. Discharge weight 212 pounds.   Discharged from [facility] 03/17/13 after being diuresed with Lasix drip and later transitioned to 60 mg of Torsemide daily. She was discharged back to SNF. Discharge weight 217 pounds.   She presented to [facility] ED 03/20/13 with increased dyspnea and lower extremity edema. Her weight was up a few pounds to 220. She was given 80 mg IV lasix and Metolazone 2.5 mg daily. CT with large R pleural effusion with associate RLL collapse.  Thoracentesis 5/20 1100 clear yellow pleural fluid drained. Palliative meeting earlier this week and they want to continue limited medical interventions for stabilization and symptom control. Stable overnight. Weight up 9lbs (on new bed), 24 hr I/O -600 . Cr 2.14. Denies SOB, orthopnea or CP. Still on lasix gtt, however foley was removed Thursday .Incontinent everywhere. Plans to d/c to [facility] today in the works.  Objective:   Weight Range:  Vital Signs:   Temp:  [97.9 F (36.6 C)-98.2 F (36.8 C)] 98 F (36.7 C) (05/25 0500) Pulse Rate:  [69-83] 69  (05/25 0500) Resp:  [18-20] 20 (05/25 0500) BP: (111-124)/(50-79) 111/50 mmHg (05/25 0500) SpO2:  [97 %-98 %] 97 % (05/25 0500) Weight:  [216 lb 0.8 oz (98 kg)-216 lb 14.9 oz (98.4 kg)] 216 lb 0.8 oz (98 kg) (05/25 0500) Last BM Date: 03/25/13  Weight change: Filed Weights   03/27/13 0357 03/27/13 1100 03/28/13 0500  Weight: 218 lb 9.6 oz (99.156 kg) 216 lb 14.9 oz (98.4 kg) 216 lb 0.8 oz (98 kg)    Intake/Output:   Intake/Output Summary (Last 24 hours) at 03/28/13 4098 Last data filed at 03/28/13 0700  Gross per 24 hour  Intake    720 ml  Output   1751 ml  Net  -1031 ml     Physical Exam: General:  Elderly chronically appearing. No resp difficulty; laying in bed HEENT: normal Neck: supple. JVP 9-10. Carotids 2+ bilat; no bruits. No lymphadenopathy or thryomegaly appreciated. Cor: PMI nondisplaced. Regular rate & irregular rhythm. No rubs, gallops or murmurs. Lungs: clear Abdomen: soft, nontender, mild distention. No hepatosplenomegaly. No bruits or masses. Good bowel sounds. Extremities: no cyanosis, clubbing, rash, trace edema Neuro: alert & orientedx3, cranial nerves grossly intact. moves all 4 extremities w/o difficulty. Affect pleasant Pain to palpation over thoracentesis site  Telemetry: AFib 80-90s  Labs: Basic Metabolic Panel:  Recent Labs Lab 03/24/13 0520 03/25/13 0430 03/26/13 0505 03/27/13 0500 03/28/13 0412  NA 137 136 134* 135 134*  K 4.2 4.2 4.9 5.4* 4.2  CL 89* 90* 91* 89* 89*  CO2 36* 37* 38* 39* 36*  GLUCOSE 203* 161* 170* 194* 157*  BUN 60* 58* 64* 65* 64*  CREATININE 2.19* 2.14* 2.42* 2.64* 2.61*  CALCIUM 9.6 9.7 9.7 9.7 9.8    Liver Function Tests:  Recent Labs Lab 03/23/13 1801  PROT 7.8   No results found for this basename: LIPASE, AMYLASE,  in the last 168 hours No results found for this basename: AMMONIA,  in the last 168 hours  CBC:  Recent Labs Lab 03/22/13 0036  WBC 5.0  HGB 8.9*  HCT 27.3*  MCV 86.9  PLT 104*     Cardiac Enzymes:  Recent Labs Lab 03/21/13 1321 03/21/13 1840 03/22/13 0036  TROPONINI <0.30 <0.30 <0.30    BNP: BNP (last 3 results)  Recent Labs  03/10/13 2117 03/13/13 0747 03/19/13 2123  PROBNP 4061.0* 4669.0* 4611.0*     Other results:    Imaging: No results found.   Medications:     Scheduled Medications: . allopurinol  100 mg Oral Daily  . amiodarone  400 mg Oral Daily  . busPIRone  5 mg Oral Q12H  . carvedilol  25 mg Oral BID WC  . docusate sodium  100 mg Oral BID  . guaiFENesin  600 mg Oral BID  . insulin aspart  0-9 Units Subcutaneous TID WC  . isosorbide-hydrALAZINE  1 tablet Oral BID  . OxyCODONE  10 mg Oral Q12H  . pantoprazole  40 mg Oral Daily  . polyethylene glycol  17 g Oral BID  . sodium chloride  3 mL Intravenous Q12H  . torsemide  60 mg Oral BID  . traZODone  50 mg Oral QHS  . Warfarin - Pharmacist Dosing Inpatient   Does not apply q1800    Infusions:    PRN Medications: sodium chloride, acetaminophen, ALPRAZolam, ipratropium, levalbuterol, menthol-cetylpyridinium, metoCLOPramide (REGLAN) injection, nitroGLYCERIN, ondansetron (ZOFRAN) IV, oxyCODONE, phenol, simethicone, sodium chloride, sodium chloride   Assessment:   1. Acute on chronic diastolic HF  2. R Pleural effusion, large  3.Acute on chronic renal failure. (Creatinine baseline 2.6)  4. h/o HTN with hypertensive heart disease  5. Obesity, suspect OHS/OSA  6. Chronic atrial fib  - on coumadin  7. Deconditioning  8. DNR  9. Cardiorenal syndrome, severe   Plan/Discussion:   Pain at thoracentesis site gone.  Breathing much better. Was OOB in room yesterday Would consider d/c foley.  Continue demedex bid.  Will have to live with Cr around 2.5 Possible discharge back to SNF next 48 hours.  DNR and turned down for dialysis  Will sign off   Gina Ashley

## 2013-03-29 LAB — BASIC METABOLIC PANEL
BUN: 65 mg/dL — ABNORMAL HIGH (ref 6–23)
CO2: 39 mEq/L — ABNORMAL HIGH (ref 19–32)
Chloride: 88 mEq/L — ABNORMAL LOW (ref 96–112)
Creatinine, Ser: 2.77 mg/dL — ABNORMAL HIGH (ref 0.50–1.10)
Glucose, Bld: 228 mg/dL — ABNORMAL HIGH (ref 70–99)
Potassium: 4.2 mEq/L (ref 3.5–5.1)

## 2013-03-29 LAB — GLUCOSE, CAPILLARY
Glucose-Capillary: 187 mg/dL — ABNORMAL HIGH (ref 70–99)
Glucose-Capillary: 204 mg/dL — ABNORMAL HIGH (ref 70–99)
Glucose-Capillary: 192 mg/dL — ABNORMAL HIGH (ref 70–99)
Glucose-Capillary: 180 mg/dL — ABNORMAL HIGH (ref 70–99)

## 2013-03-29 MED ORDER — WARFARIN SODIUM 3 MG PO TABS
3.0000 mg | ORAL_TABLET | Freq: Once | ORAL | Status: AC
  Administered 2013-03-29: 3 mg via ORAL
  Filled 2013-03-29: qty 1

## 2013-03-29 MED ORDER — BISACODYL 10 MG RE SUPP
10.0000 mg | Freq: Every day | RECTAL | Status: DC | PRN
  Administered 2013-03-29: 10 mg via RECTAL
  Filled 2013-03-29: qty 1

## 2013-03-29 NOTE — Progress Notes (Addendum)
Patient ID: Gina Ashley, female   DOB: [birth year], 79 y.o.   MRN: [id number]     Subjective:    Gina Ashley is an 79 y.o. female with history of chronic atrial fibrillation on coumadin, diastolic heart failure and HTN. She also has DM type 2, COPD, OSA on CPAP, A Fib- chronic coumadin, and CKD failure with baseline Cr 2.2. She has had 5 hospitalizations in the last 6 months, 1 included tibia/fibula fracture, 1 for UTI, and the rest have been due to massive fluid overload in the setting of diastolic heart failure. Per nephrology she is not a candidate for HD.   Admitted March 2014 with massive volume overload. Weight 280 pounds. She required Milrinone and Dopamine. Permanent atrial fib and went for TEE/DCCV on 3/18 but unable to complete due to smoke. Continued on amio for rate control and coumadin. Evaluated by Nephrology due to renal failure and she was not a candidate for HD. Discharged to SNF. Discharge weight 212 pounds.   Discharged from [facility] 03/17/13 after being diuresed with Lasix drip and later transitioned to 60 mg of Torsemide daily. She was discharged back to SNF. Discharge weight 217 pounds.   She presented to [facility] ED 03/20/13 with increased dyspnea and lower extremity edema. Her weight was up a few pounds to 220. She was given 80 mg IV lasix and Metolazone 2.5 mg daily. CT with large R pleural effusion with associate RLL collapse.  Thoracentesis 5/20 1100 clear yellow pleural fluid drained. Palliative meeting was held, patient and family want to continue limited medical interventions for stabilization and symptom control. Creatinine rising slowly, up to 2.77. Denies SOB, orthopnea or CP.  Now on torsemide 60 mg bid.  Weight fluctuating, up 2 lbs today.  Rhythm appears to be atypical atrial flutter today.    Objective:   Weight Range:  Vital Signs:   Temp:  [97.7 F (36.5 C)-98.2 F (36.8 C)] 98.2 F (36.8 C) (05/26 0452) Pulse Rate:  [69-70] 70 (05/26 0452) Resp:  [18] 18 (05/26  0452) BP: (106-111)/(63-71) 108/66 mmHg (05/26 0452) SpO2:  [98 %-99 %] 99 % (05/26 0452) Weight:  [218 lb 4.1 oz (99 kg)] 218 lb 4.1 oz (99 kg) (05/26 0452) Last BM Date: 03/27/13  Weight change: Filed Weights   03/27/13 1100 03/28/13 0500 03/29/13 0452  Weight: 216 lb 14.9 oz (98.4 kg) 216 lb 0.8 oz (98 kg) 218 lb 4.1 oz (99 kg)    Intake/Output:   Intake/Output Summary (Last 24 hours) at 03/29/13 0829 Last data filed at 03/29/13 0453  Gross per 24 hour  Intake    960 ml  Output   1201 ml  Net   -241 ml     Physical Exam: General:  Elderly chronically appearing. No resp difficulty; laying in bed HEENT: normal Neck: supple. JVP 12. Carotids 2+ bilat; no bruits. No lymphadenopathy or thryomegaly appreciated. Cor: PMI nondisplaced. Regular rate & irregular rhythm. No rubs, gallops or murmurs. Lungs: Dependent crackles. Abdomen: soft, nontender, mild distention. No hepatosplenomegaly. No bruits or masses. Good bowel sounds. Extremities: no cyanosis, clubbing, rash, trace edema Neuro: alert & orientedx3, cranial nerves grossly intact. moves all 4 extremities w/o difficulty. Affect pleasant  Telemetry: AFib 80-90s  Labs: Basic Metabolic Panel:  Recent Labs Lab 03/25/13 0430 03/26/13 0505 03/27/13 0500 03/28/13 0412 03/29/13 0435  NA 136 134* 135 134* 133*  K 4.2 4.9 5.4* 4.2 4.2  CL 90* 91* 89* 89* 88*  CO2 37* 38* 39* 36* 39*  GLUCOSE 161* 170* 194* 157* 228*  BUN 58* 64* 65* 64* 65*  CREATININE 2.14* 2.42* 2.64* 2.61* 2.77*  CALCIUM 9.7 9.7 9.7 9.8 9.4    Liver Function Tests:  Recent Labs Lab 03/23/13 1801  PROT 7.8   No results found for this basename: LIPASE, AMYLASE,  in the last 168 hours No results found for this basename: AMMONIA,  in the last 168 hours  CBC: No results found for this basename: WBC, NEUTROABS, HGB, HCT, MCV, PLT,  in the last 168 hours  Cardiac Enzymes: No results found for this basename: CKTOTAL, CKMB, CKMBINDEX, TROPONINI,   in the last 168 hours  BNP: BNP (last 3 results)  Recent Labs  03/10/13 2117 03/13/13 0747 03/19/13 2123  PROBNP 4061.0* 4669.0* 4611.0*     Other results:    Imaging: No results found.   Medications:     Scheduled Medications: . allopurinol  100 mg Oral Daily  . amiodarone  400 mg Oral Daily  . busPIRone  5 mg Oral Q12H  . carvedilol  25 mg Oral BID WC  . docusate sodium  100 mg Oral BID  . guaiFENesin  600 mg Oral BID  . insulin aspart  0-9 Units Subcutaneous TID WC  . OxyCODONE  10 mg Oral Q12H  . pantoprazole  40 mg Oral Daily  . polyethylene glycol  17 g Oral BID  . sodium chloride  3 mL Intravenous Q12H  . torsemide  60 mg Oral BID  . traZODone  50 mg Oral QHS  . Warfarin - Pharmacist Dosing Inpatient   Does not apply q1800    Infusions:    PRN Medications: sodium chloride, acetaminophen, ALPRAZolam, bisacodyl, ipratropium, levalbuterol, menthol-cetylpyridinium, metoCLOPramide (REGLAN) injection, nitroGLYCERIN, ondansetron (ZOFRAN) IV, oxyCODONE, phenol, simethicone, sodium chloride, sodium chloride   Assessment:   1. Acute on chronic diastolic HF  2. R Pleural effusion, large  3. Acute on chronic renal failure with cardiorenal syndrome.  Not candidate for HD.  4. h/o HTN with hypertensive heart disease  5. Obesity, suspect OHS/OSA  6. Chronic atrial fib  - on coumadin  - currently appears to be in atypical atrial flutter.  7. Deconditioning  8. DNR    Plan/Discussion:    Still volume overloaded on exam but creatinine rising.  Very difficult situation.  Not HD candidate.  Will continue current torsemide and follow UOP, will not increase diuretics with rising creatinine.    I will stop Bidil given SBP in the 100s, would like to see BP a bit higher with her renal dysfunction.   Appears to have atypical atrial flutter on telemetry, rate is controlled.  Will get ECG.   Eventually back to SNF but not ready today, suspect she would come right  back.   Marca Ancona 03/29/2013 8:34 AM

## 2013-03-29 NOTE — Progress Notes (Signed)
No change in DC plan. Awaiting medical stability per MD for SNF placement at [facility].  CSW will continue to monitor and provide support to patient and family as needed.  Lorri Frederick. [name]  [phone]

## 2013-03-29 NOTE — Progress Notes (Signed)
ANTICOAGULATION CONSULT NOTE   Pharmacy Consult for coumadin Indication: atrial fibrillation  Allergies  Allergen Reactions  . Morphine Sulfate Other (See Comments)    REACTION: change in personality  . Remeron (Mirtazapine) Other (See Comments)    Altered mental status, lethargy  . Tuna (Fish Allergy) Other (See Comments)    unknown  . Penicillins Rash    Tolerates Ancef.    Patient Measurements: Height: 5\' 3"  (160 cm) Weight: 218 lb 4.1 oz (99 kg) IBW/kg (Calculated) : 52.4  Vital Signs: Temp: 98.2 F (36.8 C) (05/26 0452) Temp src: Oral (05/26 0452) BP: 110/62 mmHg (05/26 0848) Pulse Rate: 70 (05/26 0848)  Labs:  Recent Labs  03/27/13 0500 03/28/13 0412 03/29/13 0435  LABPROT 21.8* 23.4* 28.5*  INR 1.99* 2.19* 2.86*  CREATININE 2.64* 2.61* 2.77*    Estimated Creatinine Clearance: 17.2 ml/min (by C-G formula based on Cr of 2.77).  Assessment: 79 yo female admitted with SOB and leg swelling noted with HF.  Thoracentesis 5/20 - no complications noted. On Coumadin PTA - dose was 3 mg/day  INR therapeutic at 2.86 today. No bleeding noted.  Goal of Therapy:  INR 2-3 Monitor platelets by anticoagulation protocol: Yes   Plan:  Warfarin 3 mg x1 tonight Daily INR   Doris Cheadle, PharmD Clinical Pharmacist Pager: [phone] Phone: [phone] 03/29/2013 9:12 AM

## 2013-03-29 NOTE — Progress Notes (Signed)
OT Cancellation Note  Patient Details Name: Gina Ashley MRN: [id number] DOB: November 28, 1926   Cancelled Treatment:    Reason Eval/Treat Not Completed: Other (comment) (pt refused due to being currently comfortable)  [name], [place]ung 03/29/2013, 1:11 PM

## 2013-03-29 NOTE — Progress Notes (Signed)
[facility] PROGRESS NOTE  Gina Ashley BMW:[id number] DOB: [birth year] DOA: 03/19/2013 PCP: Kimber Relic, MD  Assessment/Plan: Principal Problem:   Acute on chronic diastolic heart failure Active Problems:   DM   HYPERTENSION   COPD (chronic obstructive pulmonary disease)   CKD (chronic kidney disease)   Shortness of breath   Chronic a-fib   Warfarin-induced coagulopathy   Hypoxemia   Pleural effusion   Pain in back   Pain in hip   Anxiety state, unspecified   Palliative care encounter   1.Acute on ChronicDiastolic CHF - improved and after thoracentesis breathing much better.  -Will continue demadex 60mg  BID, continue following daily weights -Will continue low sodium diet and continue strict I's and O's. -breathing remains stable; unfortunately patient weight is up and Cr up 2.77 today.  -Will follow cardiology rec's for further adjustments on her meds. Plan is not for her to go up on torsemide.  2. SOB due to #1; overall stable.  3. COPD -stable.   4. DM2- continue carb modified diet and SSI coverage.  5. HTN-soft. Imdur has been hold by cardiology rec's.  6. CKD- Monitor Bun/Cr, baseline CR= 2.77, Creatinine is rising. -most likely associated with decrease BP. -Will follow trend and urine output. BP meds adjusted to prevent further hypotension  7. Chronic Atrial Fibrillation- per cardiology. Continue coumadin, coreg and amiodarone; rate controlled.   8. Warfarin Induced Coaguloapthy-INR down and improving. Dose manage by pharmacy. Per cardiology rec's continue coumadin. Insurance will no pay for apixaban but will cover xarelto; will let Cardiology decide if ok with switch.  9-Chronic pain: will continue pain meds as recommended by palliative team (on oxycontin and oxycodone)  10-Anxiety: continue PRN xanax.  11-Klebsiella colonization in urine: at this point no signs or symptoms for infections. Will hold on antibiotics tx.  Code Status: DO NOT  RESUSCITATE Family Communication: Daughter updated at bedside about patient's clinical progress Disposition Plan:  SNF with palliative care following her at discharge    Consultants:  Heart failure team  Pulmonology  Palliative care  Procedures:  None  Antibiotics: None  HPI/Subjective: Patient is afebrile; reporting breathing is stable; but on exam there is fine crackles at bases and positive JVD. Cr is 2.77 (up) and BP is in low 100's  Objective: Filed Vitals:   03/28/13 1505 03/28/13 2103 03/29/13 0452 03/29/13 0848  BP: 106/71 111/63 108/66 110/62  Pulse: 70 69 70 70  Temp: 97.8 F (36.6 C) 97.7 F (36.5 C) 98.2 F (36.8 C)   TempSrc: Oral Oral Oral   Resp: 18 18 18 18   Height:      Weight:   99 kg (218 lb 4.1 oz)   SpO2: 99% 98% 99%     Intake/Output Summary (Last 24 hours) at 03/29/13 0915 Last data filed at 03/29/13 2725  Gross per 24 hour  Intake    960 ml  Output   1201 ml  Net   -241 ml    Exam:  General: Elderly chronically ill appearing. No resp distress; no CP  HEENT: normal  Neck: supple. No bruits. No lymphadenopathy or thryomegaly appreciated. Positive JVD Cardiology: PMI nondisplaced. Regular rate & rhythm. No rubs, gallops or murmurs. Telemetry showing atypical flutter (intermittently) Lungs: good air movement bilaterally and fine crackles at bases Abdomen: soft, nontender, nondistended. No hepatosplenomegaly. No bruits or masses. Good bowel sounds.  Extremities: no cyanosis, 1+ edema bilaterally Neuro: alert & orientedx3, cranial nerves grossly intact. moves all 4 extremities w/o difficulty. Affect  pleasant    Data Reviewed: Basic Metabolic Panel:  Recent Labs Lab 03/25/13 0430 03/26/13 0505 03/27/13 0500 03/28/13 0412 03/29/13 0435  NA 136 134* 135 134* 133*  K 4.2 4.9 5.4* 4.2 4.2  CL 90* 91* 89* 89* 88*  CO2 37* 38* 39* 36* 39*  GLUCOSE 161* 170* 194* 157* 228*  BUN 58* 64* 65* 64* 65*  CREATININE 2.14* 2.42* 2.64* 2.61*  2.77*  CALCIUM 9.7 9.7 9.7 9.8 9.4    Liver Function Tests:  Recent Labs Lab 03/23/13 1801  PROT 7.8   BNP (last 3 results)  Recent Labs  03/10/13 2117 03/13/13 0747 03/19/13 2123  PROBNP 4061.0* 4669.0* 4611.0*   CBG:  Recent Labs Lab 03/28/13 0635 03/28/13 1122 03/28/13 1703 03/28/13 2125 03/29/13 0631  GLUCAP 132* 175* 156* 167* 187*    Recent Results (from the past 240 hour(s))  URINE CULTURE     Status: None   Collection Time    03/20/13  1:33 AM      Result Value Range Status   Specimen Description URINE, CATHETERIZED   Final   Special Requests NONE   Final   Culture  Setup Time 03/20/2013 13:18   Final   Colony Count >=100,000 COLONIES/ML   Final   Culture     Final   Value: KLEBSIELLA PNEUMONIAE     Note: THIS ISOLATE HAS BEEN CONFIRMED AS A  [facility] CARBAPENEMASE PRODUCER COLISTIN 0.125ug/mL ETEST results for this drug are "FOR INVESTIGATIONAL USE ONLY" and should NOT be used for clinical purposes. CRITICAL RESULT CALLED TO, READ BACK BY AND VERIFIED      WITH: TIFFANY @ 1:38PM  03/23/13 BY DWEEKS   Report Status 03/23/2013 FINAL   Final   Organism ID, Bacteria KLEBSIELLA PNEUMONIAE   Final  BODY FLUID CULTURE     Status: None   Collection Time    03/23/13  4:46 PM      Result Value Range Status   Specimen Description PLEURAL FLUID RIGHT   Final   Special Requests Immunocompromised   Final   Gram Stain     Final   Value: NO WBC SEEN     NO ORGANISMS SEEN   Culture NO GROWTH 3 DAYS   Final   Report Status 03/27/2013 FINAL   Final     Studies: Dg Chest 2 View  03/22/2013   *RADIOLOGY REPORT*  Clinical Data: Shortness of breath, follow up of effusion  CHEST - 2 VIEW  Comparison: CT chest of 03/21/2013 and chest x-ray of 03/19/2013  Findings: Opacity remains throughout much of the right lung base most consistent with a moderate to large right pleural effusion with atelectasis of the right lower lobe.  A small left pleural effusion remains.  Cardiomegaly  is stable and there does appear to be mild pulmonary vascular congestion present.  Deviation of the tracheal air shadow to the right of midline is most consistent with thyroid goiter with a focus of calcification noted in the lower left neck.  IMPRESSION:  1.  Little change to some increase in volume of the right pleural effusion with right basilar atelectasis. 2.  Tiny left pleural effusion. 3.  Cardiomegaly and pulmonary vascular congestion. 4.  Probable thyroid goiter.  Correlate clinically.   Original Report Authenticated By: Dwyane Dee, M.D.   Dg Chest 2 View  03/19/2013   *RADIOLOGY REPORT*  Clinical Data: Chest and abdominal pain  CHEST - 2 VIEW  Comparison: 03/16/2013  Findings: Moderate cardiac enlargement is  again identified. Airspace consolidation within the superior segment of right lower lobe and right middle lobe is again noted and does not appear significantly changed from previous exam.  The left lung is clear. No pleural effusion is identified.  IMPRESSION:  1.  Persistent consolidation involving the right middle lobe and superior segment of right lower lobe. 2.  Cardiac enlargement.   Original Report Authenticated By: Signa Kell, M.D.   Dg Chest 2 View  03/10/2013   *RADIOLOGY REPORT*  Clinical Data: Weakness and chest pain.  No shortness of breath.  CHEST - 2 VIEW  Comparison: 02/18/2013.  Findings: Cardiac enlargement.  Right base effusion appears increased.  Right basilar atelectasis.  No infiltrate or overt edema.  Degenerative changes thoracic spine.  IMPRESSION: Increased right pleural effusion.   Original Report Authenticated By: Davonna Belling, M.D.   Ct Chest Wo Contrast  03/21/2013   *RADIOLOGY REPORT*  Clinical Data: Shortness of breath  CT CHEST WITHOUT CONTRAST  Technique:  Multidetector CT imaging of the chest was performed following the standard protocol without IV contrast.  Comparison: CT abdomen pelvis 05/22/2012  Findings: The thyroid gland is enlarged, left lobe greater  than right. There are several calcifications and multiple nodules within the left thyroid lobe.  One of the calcifications is very dense with calcified and measures 14 mm.  There is some sternal extension of the left lobe the thyroid gland.  Findings are most consistent with thyroid goiter.  There is a moderate to large right pleural effusion and a small left pleural effusion.  These effusions appear simple.  There is moderate cardiomegaly, with dilatation of both atria.  Coronary artery calcifications are present.  There is significant collapse / consolidation of the right lower lobe, overlying the pleural effusion.  There is scattered areas of atelectasis bilaterally.  There are scattered nonpathologically enlarged mediastinal lymph nodes. Sensitivity for the evaluation for hilar lymphadenopathy is decreased without intravenous contrast.  The thoracic aorta is normal in caliber.  There are extensive degenerative changes of the thoracic spine. Thoracic spine vertebral bodies are normal in height and alignment.  Imaging of the upper abdomen demonstrates a moderate degree of abdominal ascites, most prominent adjacent to the liver.  IMPRESSION:  1.  Moderate to large right and small left pleural effusions. There is extensive collapse and/or consolidation of the right lower lobe. 2. Moderate cardiomegaly with coronary artery atherosclerosis. 3.  Findings suggestive of thyroid goiter. 4.  Abdominal ascites.   Original Report Authenticated By: Britta Mccreedy, M.D.   Dg Abd Acute W/chest  03/16/2013   *RADIOLOGY REPORT*  Clinical Data: Shortness breath, abdominal pain, distention.  ACUTE ABDOMEN SERIES (ABDOMEN 2 VIEW & CHEST 1 VIEW)  Comparison: Chest x-ray 03/10/2013.  Abdomen image 10/19/2012.  Findings: There is cardiomegaly with vascular congestion.  Focal right mid and lower lung consolidation with moderate right pleural effusion.  No focal opacity on the left.  Nonobstructive bowel gas pattern.  No free air or  organomegaly. Prior cholecystectomy.  Moderate stool burden throughout the colon. No suspicious calcifications.  No acute bony abnormality.  IMPRESSION: Right mid and lower lung consolidation.  Question pneumonia. Moderate right pleural effusion.  Cardiomegaly, vascular congestion.  Prior cholecystectomy.   Original Report Authenticated By: Charlett Nose, M.D.    Scheduled Meds: . allopurinol  100 mg Oral Daily  . amiodarone  400 mg Oral Daily  . busPIRone  5 mg Oral Q12H  . carvedilol  25 mg Oral BID WC  . docusate sodium  100 mg Oral BID  . guaiFENesin  600 mg Oral BID  . insulin aspart  0-9 Units Subcutaneous TID WC  . OxyCODONE  10 mg Oral Q12H  . pantoprazole  40 mg Oral Daily  . polyethylene glycol  17 g Oral BID  . sodium chloride  3 mL Intravenous Q12H  . torsemide  60 mg Oral BID  . traZODone  50 mg Oral QHS  . warfarin  3 mg Oral ONCE-1800  . Warfarin - Pharmacist Dosing Inpatient   Does not apply q1800   Continuous Infusions:    Principal Problem:   Acute on chronic diastolic heart failure Active Problems:   DM   HYPERTENSION   COPD (chronic obstructive pulmonary disease)   CKD (chronic kidney disease)   Shortness of breath   Chronic a-fib   Warfarin-induced coagulopathy   Hypoxemia   Pleural effusion   Pain in back   Pain in hip   Anxiety state, unspecified   Palliative care encounter   Time spent: >30 minutes   Wendelyn Kiesling  [facility] Pager [phone]. If 8PM-8AM, please contact night-coverage at [email or web address], password [email or web address] 03/29/2013, 9:15 AM  LOS: 10 days

## 2013-03-29 NOTE — Progress Notes (Signed)
Physical Therapy Treatment Patient Details Name: Jatavia Keltner MRN: [id number] DOB: 1927/07/20 Today's Date: 03/29/2013 Time: 6213-0865 PT Time Calculation (min): 29 min  PT Assessment / Plan / Recommendation Comments on Treatment Session  Pt agreeable to participate in therapy.  Limited by fatigue & weakness.  Cont to strongly recommend SNF at d/c to maximize functional recovery & independence with mobility.      Follow Up Recommendations  SNF     Does the patient have the potential to tolerate intense rehabilitation     Barriers to Discharge        Equipment Recommendations  None recommended by PT    Recommendations for Other Services    Frequency Min 3X/week   Plan Discharge plan remains appropriate;Frequency remains appropriate    Precautions / Restrictions Precautions Precautions: Fall Precaution Comments: LLE upright AFO Restrictions Weight Bearing Restrictions: No       Mobility  Bed Mobility Supine to Sit: 4: Min assist;HOB elevated;With rails Sitting - Scoot to Edge of Bed: 4: Min guard Details for Bed Mobility Assistance: Cues for sequencing & technique.  (A) to lift shoulders/trunk to sitting upright.   Transfers Transfers: Sit to Stand;Stand to Sit Sit to Stand: 1: +2 Total assist;With upper extremity assist;With armrests;From bed;From chair/3-in-1 Sit to Stand: Patient Percentage: 60% Stand to Sit: 3: Mod assist;With armrests;To bed;To chair/3-in-1;With upper extremity assist Details for Transfer Assistance: Performed 4x's but Pt unable to maintain standing >10 secs first 2 trials due to LE weakness & knees buckling.  Cues for hand placement, technique, & use of rocking motion to gain momentum to achieve standing;  (A) to achieve standing, balance due to posterior lean, & controlled descent.  Pt cont's to attempt to sit without ensuring seated surface is behind her.   Ambulation/Gait Ambulation/Gait Assistance: 1: +2 Total assist Ambulation/Gait: Patient  Percentage: 70% Ambulation Distance (Feet): 15 Feet Assistive device: Rolling walker Ambulation/Gait Assistance Details: (A) for stability & safety.  Pt's bil knees buckling.  Fatigues quickly.  Required seated rest break.   Gait Pattern: Decreased step length - right;Decreased step length - left;Decreased hip/knee flexion - right;Decreased hip/knee flexion - left;Trunk flexed Gait velocity: decreased Stairs: No Wheelchair Mobility Wheelchair Mobility: No      PT Goals Acute Rehab PT Goals Time For Goal Achievement: 04/05/13 Potential to Achieve Goals: Good Pt will go Supine/Side to Sit: with supervision PT Goal: Supine/Side to Sit - Progress: Progressing toward goal Pt will Transfer Bed to Chair/Chair to Bed: with supervision Pt will Stand: with supervision;3 - 5 min;with unilateral upper extremity support Pt will Ambulate: 1 - 15 feet;with min assist;with rolling walker PT Goal: Ambulate - Progress: Progressing toward goal  Visit Information  Last PT Received On: 03/29/13 Assistance Needed: +2    Subjective Data      Cognition  Cognition Arousal/Alertness: Awake/alert Behavior During Therapy: WFL for tasks assessed/performed Overall Cognitive Status: Within Functional Limits for tasks assessed    Balance     End of Session PT - End of Session Equipment Utilized During Treatment: Gait belt;Oxygen Activity Tolerance: Patient limited by fatigue Patient left: in chair;with call bell/phone within reach;with family/visitor present Nurse Communication: Mobility status     Verdell Face, [place] [phone] 03/29/2013

## 2013-03-30 DIAGNOSIS — D638 Anemia in other chronic diseases classified elsewhere: Secondary | ICD-10-CM

## 2013-03-30 LAB — BASIC METABOLIC PANEL
BUN: 62 mg/dL — ABNORMAL HIGH (ref 6–23)
CO2: 40 mEq/L (ref 19–32)
Calcium: 9.5 mg/dL (ref 8.4–10.5)
GFR calc non Af Amer: 16 mL/min — ABNORMAL LOW (ref >90)
Glucose, Bld: 124 mg/dL — ABNORMAL HIGH (ref 70–99)
Sodium: 134 mEq/L — ABNORMAL LOW (ref 135–145)

## 2013-03-30 LAB — CBC
Hemoglobin: 8.9 g/dL — ABNORMAL LOW (ref 12.0–15.0)
MCH: 27.7 pg (ref 26.0–34.0)
MCHC: 31.6 g/dL (ref 30.0–36.0)
MCV: 87.9 fL (ref 78.0–100.0)
Platelets: 113 10*3/uL — ABNORMAL LOW (ref 150–400)
RBC: 3.21 MIL/uL — ABNORMAL LOW (ref 3.87–5.11)

## 2013-03-30 LAB — GLUCOSE, CAPILLARY: Glucose-Capillary: 120 mg/dL — ABNORMAL HIGH (ref 70–99)

## 2013-03-30 MED ORDER — WARFARIN SODIUM 3 MG PO TABS: 3.0000 mg | ORAL_TABLET | Freq: Every day | ORAL | Status: DC

## 2013-03-30 MED ORDER — ACETAZOLAMIDE 250 MG PO TABS: 250.0000 mg | ORAL_TABLET | Freq: Every day | ORAL | Status: AC

## 2013-03-30 MED ORDER — BUSPIRONE HCL 5 MG PO TABS: 5.0000 mg | ORAL_TABLET | Freq: Two times a day (BID) | ORAL | Status: DC

## 2013-03-30 MED ORDER — TORSEMIDE 20 MG PO TABS: 60.0000 mg | ORAL_TABLET | Freq: Two times a day (BID) | ORAL | Status: DC

## 2013-03-30 MED ORDER — OXYCODONE HCL 5 MG PO TABS: 5.0000 mg | ORAL_TABLET | ORAL | Status: DC | PRN

## 2013-03-30 MED ORDER — ACETAZOLAMIDE 250 MG PO TABS
250.0000 mg | ORAL_TABLET | Freq: Two times a day (BID) | ORAL | Status: DC
  Filled 2013-03-30 (×4): qty 1

## 2013-03-30 MED ORDER — OXYCODONE HCL ER 10 MG PO T12A: 10.0000 mg | EXTENDED_RELEASE_TABLET | Freq: Two times a day (BID) | ORAL | Status: DC

## 2013-03-30 MED ORDER — ALPRAZOLAM 0.25 MG PO TABS: 0.2500 mg | ORAL_TABLET | Freq: Three times a day (TID) | ORAL | Status: DC | PRN

## 2013-03-30 NOTE — Progress Notes (Signed)
Report called and given to nurse [facility] @ [facility].

## 2013-03-30 NOTE — Clinical Social Work Placement (Signed)
     Clinical Social Work Department CLINICAL SOCIAL WORK PLACEMENT NOTE 03/30/2013  Patient:  Gina Ashley, Gina Ashley  Account Number:  [id number] Admit date:  03/19/2013  Clinical Social Worker:  Pollyann Savoy, LCSW  Date/time:  03/21/2013 03:51 PM  Clinical Social Work is seeking post-discharge placement for this patient at the following level of care:   SKILLED NURSING   (*CSW will update this form in Epic as items are completed)   03/21/2013  Patient/family provided with [facility] Department of Clinical Social Works list of facilities offering this level of care within the geographic area requested by the patient (or if unable, by the patients family).  03/21/2013  Patient/family informed of their freedom to choose among providers that offer the needed level of care, that participate in Medicare, Medicaid or managed care program needed by the patient, have an available bed and are willing to accept the patient.  03/21/2013  Patient/family informed of [facility] ownership interest in [facility], as well as of the fact that they are under no obligation to receive care at this facility.  PASARR submitted to EDS on  PASARR number received from EDS on   FL2 transmitted to all facilities in geographic area requested by pt/family on  03/21/2013 FL2 transmitted to all facilities within larger geographic area on   Patient informed that his/her managed care company has contracts with or will negotiate with  certain facilities, including the following:   Has existing PASARR     Patient/family informed of bed offers received:  03/22/2013 Patient chooses bed at [facility] [place] Physician recommends and patient chooses bed at    Patient to be transferred to [facility] [place] on  03/30/2013 Patient to be transferred to facility by Ambulance  Sharin Mons)  The following physician request were entered in Epic:   Additional Comments: 03/30/13  DC to SNF today  after final clearance by cardiology.  Patient and her 2 daughters were agreeable to d/c and toured facility today. Will sign papers this afternoon for admission.  They denied any further questions or concerns . Notified SNF and patient's nurse of d/c plan. No further CSW needs identified.  Lorri Frederick. Abbott Jasinski, LCSWA [phone]

## 2013-03-30 NOTE — Progress Notes (Signed)
ANTICOAGULATION CONSULT NOTE   Pharmacy Consult for coumadin Indication: atrial fibrillation  Allergies  Allergen Reactions  . Morphine Sulfate Other (See Comments)    REACTION: change in personality  . Remeron (Mirtazapine) Other (See Comments)    Altered mental status, lethargy  . Tuna (Fish Allergy) Other (See Comments)    unknown  . Penicillins Rash    Tolerates Ancef.    Patient Measurements: Height: 5\' 3"  (160 cm) Weight: 218 lb 4.1 oz (99 kg) IBW/kg (Calculated) : 52.4  Vital Signs: Temp: 97.5 F (36.4 C) (05/26 2006) Temp src: Oral (05/26 2006) BP: 112/54 mmHg (05/26 2006) Pulse Rate: 66 (05/26 2006)  Labs:  Recent Labs  03/28/13 0412 03/29/13 0435 03/30/13 0625  HGB  --   --  8.9*  HCT  --   --  28.2*  PLT  --   --  113*  LABPROT 23.4* 28.5* 31.9*  INR 2.19* 2.86* 3.33*  CREATININE 2.61* 2.77* 2.60*    Estimated Creatinine Clearance: 18.4 ml/min (by C-G formula based on Cr of 2.6).  Assessment: 79 yo female admitted with SOB and leg swelling noted with HF.  Thoracentesis 5/20 - no complications noted. On Coumadin PTA - dose was 3 mg/day  INR supratherapeutic today after a few days of Coumadin doses larger than her home dose. No bleeding noted.  Goal of Therapy:  INR 2-3 Monitor platelets by anticoagulation protocol: Yes   Plan:  No Coumadin today. Continue daily PT/INR. Planning SNF at discharge.  Tad Moore, BCPS  Clinical Pharmacist Pager [phone]  03/30/2013 7:46 AM

## 2013-03-30 NOTE — Discharge Summary (Signed)
Physician Discharge Summary  Gina Ashley WUJ:[id number] DOB: June 19, 1927 DOA: 03/19/2013  PCP: Kimber Relic, MD  Admit date: 03/19/2013 Discharge date: 03/30/2013  Time spent: >30 minutes  Recommendations for Outpatient Follow-up:  BMET to follow kidney function and electrolytes Routinely INR as per nursing home protocol to adjust coumadin   Discharge Diagnoses:  Principal Problem:   Acute on chronic diastolic heart failure Active Problems:   DM   HYPERTENSION   COPD (chronic obstructive pulmonary disease)   CKD (chronic kidney disease)   Shortness of breath   Chronic a-fib   Warfarin-induced coagulopathy   Hypoxemia   Pleural effusion   Pain in back   Pain in hip   Anxiety state, unspecified   Palliative care encounter   Discharge Condition: stable and improved. Will be discharge to SNF [facility]). Follow up with heart failure service after discharge.  Diet recommendation: low sodium diet and fluid restriction (Less than 2 G of sodium and 1.8L max fluid intake)  Filed Weights   03/28/13 0500 03/29/13 0452 03/30/13 0754  Weight: 98 kg (216 lb 0.8 oz) 99 kg (218 lb 4.1 oz) 98.703 kg (217 lb 9.6 oz)    History of present illness:  79 y.o. female resident of the [facility] who returns to the ED with complaints of worsening SOB and increasing edema of both of her legs. She denies having Chest pain or fevers or chills. She denies having a cough or chest congestion or wheezes. She was discharged on after a hospitalization for the same. She was evaluated in the ED and referred for admission for an acute exascerbation of her Chronic CHF.    Hospital Course:  1.chronic resp failure with exacerbation due to Acute on ChronicDiastolic CHF  - improved after thoracentesis and aggressive diuresis during this admission.  -Will continue demadex 60mg  BID, continue following daily weights and strict intake and output.  -low sodium diet and close follow up with heart failure  clinic   2. SOB due to #1; significantly improved at discharge.   3. COPD -stable. Continue inhalers and Oxygen supplementation  4. DM2- continue carb modified diet, latus and TID insulin for meal coverage.   5. HTN-soft but stable. Imdur has been discontinued by cardiology rec's. Will continue coreg and torsemide; low sodium diet and close follow up with heart failure/cardiology service for further medication adjustments.   6. CKD- Monitor Bun/Cr, baseline CR= around 2.5-2.6; at discharge 2.6,  -Cr up and down during admission due to changes on BP and diuresis. -needs closed follow up as an outpatient given ongoing high torsemide treatment.   7. Chronic Atrial Fibrillation- per cardiology. Continue coumadin, coreg and amiodarone; rate controlled.   8. Warfarin Induced Coaguloapthy-INR down and improved. Dose was managed by pharmacy. Per cardiology rec's continue coumadin. Insurance will no pay for apixaban but will cover xarelto; will let Cardiology decide if ok to switch.   9-Chronic pain: will continue pain meds as recommended by palliative team (on oxycontin and oxycodone). PC to follow at SNF.  10-Anxiety: continue PRN xanax.   11-Klebsiella colonization in urine: at this point no signs or symptoms for infections. Will hold on antibiotics tx.  12-OSA: continue QHS CPAP via nasal mask, settings of 10.0 cm H20 with 2 lpm O2 bleed in.   Procedures:  Thoracentesis 5/20  Consultations:  PT/OT  Cardiology service  Palliative care  Discharge Exam: Filed Vitals:   03/29/13 1100 03/29/13 1300 03/29/13 2006 03/30/13 0754  BP:  119/78 112/54  Pulse:  77 66   Temp:  98.2 F (36.8 C) 97.5 F (36.4 C)   TempSrc:  Oral Oral   Resp:  20 18   Height:      Weight:    98.703 kg (217 lb 9.6 oz)  SpO2: 95% 98% 100%    General: Elderly chronically appearing. No resp difficulty; laying in bed Daughter at bedside  HEENT: normal  Neck: supple. JVP 9-10. Carotids 2+ bilat; no  bruits. No lymphadenopathy or thryomegaly appreciated.  Cor: PMI nondisplaced. Regular rate & irregular rhythm. No rubs, gallops or murmurs.  Lungs: Decreased in the bases.  Abdomen: soft, nontender, mild distention. No hepatosplenomegaly. No bruits or masses. Good bowel sounds.  Extremities: no cyanosis, clubbing, rash, trace edema  Neuro: alert & orientedx3, cranial nerves grossly intact. moves all 4 extremities w/o difficulty. Affect pleasant   Discharge Instructions  Discharge Orders   Future Appointments Provider Department Dept Phone   04/05/2013 11:00 AM [facility] [phone]   Future Orders Complete By Expires     Diet - low sodium heart healthy  As directed     Discharge instructions  As directed     Comments:      BMET in 3 days to follow kidney function and electrolytes Take medications as prescribed Low sodium diet Strict intake and output Follow up and close communication with heart failure clinic Palliative care to follow patient at SNF.        Medication List    STOP taking these medications       furosemide 40 MG tablet  Commonly known as:  LASIX     isosorbide-hydrALAZINE 20-37.5 MG per tablet  Commonly known as:  BIDIL      TAKE these medications       acetaminophen 325 MG tablet  Commonly known as:  TYLENOL  Take 650 mg by mouth every 6 (six) hours as needed for pain.     acetaZOLAMIDE 250 MG tablet  Commonly known as:  DIAMOX  Take 1 tablet (250 mg total) by mouth daily. Treatment for 3 days     albuterol (2.5 MG/3ML) 0.083% nebulizer solution  Commonly known as:  PROVENTIL  Take 3 mLs (2.5 mg total) by nebulization every 2 (two) hours as needed for wheezing or shortness of breath.     allopurinol 100 MG tablet  Commonly known as:  ZYLOPRIM  Take 100 mg by mouth daily.     ALPRAZolam 0.25 MG tablet  Commonly known as:  XANAX  Take 1 tablet (0.25 mg total) by mouth every 8 (eight)  hours as needed for anxiety.     amiodarone 200 MG tablet  Commonly known as:  PACERONE  Take 400 mg by mouth daily.     busPIRone 5 MG tablet  Commonly known as:  BUSPAR  Take 1 tablet (5 mg total) by mouth every 12 (twelve) hours.     carvedilol 25 MG tablet  Commonly known as:  COREG  Take 25 mg by mouth 2 (two) times daily with a meal.     diphenhydrAMINE 25 mg capsule  Commonly known as:  BENADRYL  Take 25 mg by mouth every 8 (eight) hours as needed for itching.     docusate sodium 100 MG capsule  Commonly known as:  COLACE  Take 100 mg by mouth every 8 (eight) hours.     guaiFENesin 600 MG 12 hr tablet  Commonly known as:  MUCINEX  Take 600 mg by mouth 2 (two) times daily.     insulin aspart 100 UNIT/ML injection  Commonly known as:  novoLOG  Inject 3 Units into the skin 3 (three) times daily with meals. If cbg is greater than 150     insulin glargine 100 UNIT/ML injection  Commonly known as:  LANTUS  Inject 5 Units into the skin at bedtime.     multivitamin with minerals Tabs  Take 1 tablet by mouth daily.     nitroGLYCERIN 0.4 MG SL tablet  Commonly known as:  NITROSTAT  Place 0.4 mg under the tongue every 5 (five) minutes as needed for chest pain.     omeprazole 20 MG capsule  Commonly known as:  PRILOSEC  Take 40 mg by mouth daily.     ondansetron 4 MG tablet  Commonly known as:  ZOFRAN  Take 1 tablet (4 mg total) by mouth every 6 (six) hours as needed for nausea.     oxyCODONE 5 MG immediate release tablet  Commonly known as:  Oxy IR/ROXICODONE  Take 1 tablet (5 mg total) by mouth every 4 (four) hours as needed for pain (breakthrough).     OxyCODONE 10 mg T12a  Commonly known as:  OXYCONTIN  Take 1 tablet (10 mg total) by mouth every 12 (twelve) hours.     polyethylene glycol packet  Commonly known as:  MIRALAX / GLYCOLAX  Take 17 g by mouth 2 (two) times daily.     saccharomyces boulardii 250 MG capsule  Commonly known as:  FLORASTOR  Take 1  capsule (250 mg total) by mouth 2 (two) times daily.     simethicone 80 MG chewable tablet  Commonly known as:  MYLICON  Chew 80 mg by mouth every 6 (six) hours as needed for flatulence.     torsemide 20 MG tablet  Commonly known as:  DEMADEX  Take 3 tablets (60 mg total) by mouth 2 (two) times daily.     traZODone 50 MG tablet  Commonly known as:  DESYREL  Take 50 mg by mouth at bedtime.     warfarin 3 MG tablet  Commonly known as:  COUMADIN  Take 1 tablet (3 mg total) by mouth daily.  Start taking on:  03/31/2013       Allergies  Allergen Reactions  . Morphine Sulfate Other (See Comments)    REACTION: change in personality  . Remeron (Mirtazapine) Other (See Comments)    Altered mental status, lethargy  . Tuna (Fish Allergy) Other (See Comments)    unknown  . Penicillins Rash    Tolerates Ancef.       Follow-up Information   Follow up with Arvilla Meres, MD On 05/10/2013. ( at 11:00)    Contact information:   [address] [place] [address] [phone]        The results of significant diagnostics from this hospitalization (including imaging, microbiology, ancillary and laboratory) are listed below for reference.    Significant Diagnostic Studies: Dg Chest 2 View  03/22/2013   *RADIOLOGY REPORT*  Clinical Data: Shortness of breath, follow up of effusion  CHEST - 2 VIEW  Comparison: CT chest of 03/21/2013 and chest x-ray of 03/19/2013  Findings: Opacity remains throughout much of the right lung base most consistent with a moderate to large right pleural effusion with atelectasis of the right lower lobe.  A small left pleural effusion remains.  Cardiomegaly is stable and there does appear to be mild  pulmonary vascular congestion present.  Deviation of the tracheal air shadow to the right of midline is most consistent with thyroid goiter with a focus of calcification noted in the lower left neck.  IMPRESSION:  1.  Little change to some increase  in volume of the right pleural effusion with right basilar atelectasis. 2.  Tiny left pleural effusion. 3.  Cardiomegaly and pulmonary vascular congestion. 4.  Probable thyroid goiter.  Correlate clinically.   Original Report Authenticated By: Dwyane Dee, M.D.   Dg Chest 2 View  03/19/2013   *RADIOLOGY REPORT*  Clinical Data: Chest and abdominal pain  CHEST - 2 VIEW  Comparison: 03/16/2013  Findings: Moderate cardiac enlargement is again identified. Airspace consolidation within the superior segment of right lower lobe and right middle lobe is again noted and does not appear significantly changed from previous exam.  The left lung is clear. No pleural effusion is identified.  IMPRESSION:  1.  Persistent consolidation involving the right middle lobe and superior segment of right lower lobe. 2.  Cardiac enlargement.   Original Report Authenticated By: Signa Kell, M.D.   Dg Chest 2 View  03/10/2013   *RADIOLOGY REPORT*  Clinical Data: Weakness and chest pain.  No shortness of breath.  CHEST - 2 VIEW  Comparison: 02/18/2013.  Findings: Cardiac enlargement.  Right base effusion appears increased.  Right basilar atelectasis.  No infiltrate or overt edema.  Degenerative changes thoracic spine.  IMPRESSION: Increased right pleural effusion.   Original Report Authenticated By: Davonna Belling, M.D.   Ct Chest Wo Contrast  03/21/2013   *RADIOLOGY REPORT*  Clinical Data: Shortness of breath  CT CHEST WITHOUT CONTRAST  Technique:  Multidetector CT imaging of the chest was performed following the standard protocol without IV contrast.  Comparison: CT abdomen pelvis 05/22/2012  Findings: The thyroid gland is enlarged, left lobe greater than right. There are several calcifications and multiple nodules within the left thyroid lobe.  One of the calcifications is very dense with calcified and measures 14 mm.  There is some sternal extension of the left lobe the thyroid gland.  Findings are most consistent with thyroid goiter.   There is a moderate to large right pleural effusion and a small left pleural effusion.  These effusions appear simple.  There is moderate cardiomegaly, with dilatation of both atria.  Coronary artery calcifications are present.  There is significant collapse / consolidation of the right lower lobe, overlying the pleural effusion.  There is scattered areas of atelectasis bilaterally.  There are scattered nonpathologically enlarged mediastinal lymph nodes. Sensitivity for the evaluation for hilar lymphadenopathy is decreased without intravenous contrast.  The thoracic aorta is normal in caliber.  There are extensive degenerative changes of the thoracic spine. Thoracic spine vertebral bodies are normal in height and alignment.  Imaging of the upper abdomen demonstrates a moderate degree of abdominal ascites, most prominent adjacent to the liver.  IMPRESSION:  1.  Moderate to large right and small left pleural effusions. There is extensive collapse and/or consolidation of the right lower lobe. 2. Moderate cardiomegaly with coronary artery atherosclerosis. 3.  Findings suggestive of thyroid goiter. 4.  Abdominal ascites.   Original Report Authenticated By: Britta Mccreedy, M.D.   Dg Chest Port 1 View  03/23/2013   *RADIOLOGY REPORT*  Clinical Data: Post thoracentesis  PORTABLE CHEST - 1 VIEW  Comparison: 03/22/2013  Findings: Decreased right pleural effusion which is now very small following thoracentesis.  No pneumothorax.  Bibasilar atelectasis.  Mild vascular congestion is  unchanged.  IMPRESSION: Decreased right pleural effusion following thoracentesis.  No pneumothorax.   Original Report Authenticated By: Janeece Riggers, M.D.   Dg Abd Acute W/chest  03/16/2013   *RADIOLOGY REPORT*  Clinical Data: Shortness breath, abdominal pain, distention.  ACUTE ABDOMEN SERIES (ABDOMEN 2 VIEW & CHEST 1 VIEW)  Comparison: Chest x-ray 03/10/2013.  Abdomen image 10/19/2012.  Findings: There is cardiomegaly with vascular congestion.   Focal right mid and lower lung consolidation with moderate right pleural effusion.  No focal opacity on the left.  Nonobstructive bowel gas pattern.  No free air or organomegaly. Prior cholecystectomy.  Moderate stool burden throughout the colon. No suspicious calcifications.  No acute bony abnormality.  IMPRESSION: Right mid and lower lung consolidation.  Question pneumonia. Moderate right pleural effusion.  Cardiomegaly, vascular congestion.  Prior cholecystectomy.   Original Report Authenticated By: Charlett Nose, M.D.    Microbiology: Recent Results (from the past 240 hour(s))  BODY FLUID CULTURE     Status: None   Collection Time    03/23/13  4:46 PM      Result Value Range Status   Specimen Description PLEURAL FLUID RIGHT   Final   Special Requests Immunocompromised   Final   Gram Stain     Final   Value: NO WBC SEEN     NO ORGANISMS SEEN   Culture NO GROWTH 3 DAYS   Final   Report Status 03/27/2013 FINAL   Final     Labs: Basic Metabolic Panel:  Recent Labs Lab 03/26/13 0505 03/27/13 0500 03/28/13 0412 03/29/13 0435 03/30/13 0625  NA 134* 135 134* 133* 134*  K 4.9 5.4* 4.2 4.2 3.8  CL 91* 89* 89* 88* 88*  CO2 38* 39* 36* 39* 40*  GLUCOSE 170* 194* 157* 228* 124*  BUN 64* 65* 64* 65* 62*  CREATININE 2.42* 2.64* 2.61* 2.77* 2.60*  CALCIUM 9.7 9.7 9.8 9.4 9.5   Liver Function Tests:  Recent Labs Lab 03/23/13 1801  PROT 7.8   CBC:  Recent Labs Lab 03/30/13 0625  WBC 4.1  HGB 8.9*  HCT 28.2*  MCV 87.9  PLT 113*   BNP: BNP (last 3 results)  Recent Labs  03/10/13 2117 03/13/13 0747 03/19/13 2123  PROBNP 4061.0* 4669.0* 4611.0*   CBG:  Recent Labs Lab 03/29/13 0631 03/29/13 1108 03/29/13 1611 03/29/13 2123 03/30/13 0703  GLUCAP 187* 204* 192* 180* 120*    Signed:  Kree Armato  [facility] 03/30/2013, 9:57 AM

## 2013-03-30 NOTE — Progress Notes (Signed)
Patient YN:WGNF Wilk      DOB: 15-Apr-1927      AOZ:[id number]  No symptom management or disposition planning needs. Palliative Care Medicine Team will sign off. Plan is for patient to discharge to [facility] with Palliative [facility] of [facility] to follow.  Freddie Breech, CNS-C Palliative Medicine Team [facility] Health Team Phone: [phone] Pager: [phone]

## 2013-03-30 NOTE — Progress Notes (Signed)
Patient ID: Gina Ashley, female   DOB: [birth year], 79 y.o.   MRN: [id number]     Subjective:    Gina Ashley is an 79 y.o. female with history of chronic atrial fibrillation on coumadin, diastolic heart failure and HTN. She also has DM type 2, COPD, OSA on CPAP, A Fib- chronic coumadin, and CKD failure with baseline Cr 2.2. She has had 5 hospitalizations in the last 6 months, 1 included tibia/fibula fracture, 1 for UTI, and the rest have been due to massive fluid overload in the setting of diastolic heart failure. Per nephrology she is not a candidate for HD.   Admitted March 2014 with massive volume overload. Weight 280 pounds. She required Milrinone and Dopamine. Permanent atrial fib and went for TEE/DCCV on 3/18 but unable to complete due to smoke. Continued on amio for rate control and coumadin. Evaluated by Nephrology due to renal failure and she was not a candidate for HD. Discharged to SNF. Discharge weight 212 pounds.   Discharged from [facility] 03/17/13 after being diuresed with Lasix drip and later transitioned to 60 mg of Torsemide daily. She was discharged back to SNF. Discharge weight 217 pounds.   She presented to [facility] ED 03/20/13 with increased dyspnea and lower extremity edema. Her weight was up a few pounds to 220. She was given 80 mg IV lasix and Metolazone 2.5 mg daily. CT with large R pleural effusion with associate RLL collapse.  Thoracentesis 5/20 1100 clear yellow pleural fluid drained. Palliative meeting was held, patient and family want to continue limited medical interventions for stabilization and symptom control.   Over the weekend she continued on Torsemide 60 mg bid. Weight stable.  CR improved to 2.6. Yesterday she ambulated to the door with PT. Denies SOB/PND/Orthopnea.    Objective:   Weight Range:  Vital Signs:   Temp:  [97.5 F (36.4 C)-98.2 F (36.8 C)] 97.5 F (36.4 C) (05/26 2006) Pulse Rate:  [66-77] 66 (05/26 2006) Resp:  [18-20] 18 (05/26 2006) BP:  (112-119)/(54-78) 112/54 mmHg (05/26 2006) SpO2:  [95 %-100 %] 100 % (05/26 2006) Weight:  [98.703 kg (217 lb 9.6 oz)] 98.703 kg (217 lb 9.6 oz) (05/27 0754) Last BM Date: 03/29/13  Weight change: Filed Weights   03/28/13 0500 03/29/13 0452 03/30/13 0754  Weight: 98 kg (216 lb 0.8 oz) 99 kg (218 lb 4.1 oz) 98.703 kg (217 lb 9.6 oz)    Intake/Output:   Intake/Output Summary (Last 24 hours) at 03/30/13 0929 Last data filed at 03/30/13 0801  Gross per 24 hour  Intake    600 ml  Output   1277 ml  Net   -677 ml     Physical Exam: General:  Elderly chronically appearing. No resp difficulty; laying in bed Daughter at bedside HEENT: normal Neck: supple. JVP 9-10. Carotids 2+ bilat; no bruits. No lymphadenopathy or thryomegaly appreciated. Cor: PMI nondisplaced. Regular rate & irregular rhythm. No rubs, gallops or murmurs. Lungs: Decreased in the bases.  Abdomen: soft, nontender, mild distention. No hepatosplenomegaly. No bruits or masses. Good bowel sounds. Extremities: no cyanosis, clubbing, rash, trace edema Neuro: alert & orientedx3, cranial nerves grossly intact. moves all 4 extremities w/o difficulty. Affect pleasant  Telemetry: AFib 80-90s  Labs: Basic Metabolic Panel:  Recent Labs Lab 03/26/13 0505 03/27/13 0500 03/28/13 0412 03/29/13 0435 03/30/13 0625  NA 134* 135 134* 133* 134*  K 4.9 5.4* 4.2 4.2 3.8  CL 91* 89* 89* 88* 88*  CO2 38* 39* 36* 39* 40*  GLUCOSE 170* 194* 157* 228* 124*  BUN 64* 65* 64* 65* 62*  CREATININE 2.42* 2.64* 2.61* 2.77* 2.60*  CALCIUM 9.7 9.7 9.8 9.4 9.5    Liver Function Tests:  Recent Labs Lab 03/23/13 1801  PROT 7.8   No results found for this basename: LIPASE, AMYLASE,  in the last 168 hours No results found for this basename: AMMONIA,  in the last 168 hours  CBC:  Recent Labs Lab 03/30/13 0625  WBC 4.1  HGB 8.9*  HCT 28.2*  MCV 87.9  PLT 113*    Cardiac Enzymes: No results found for this basename: CKTOTAL, CKMB,  CKMBINDEX, TROPONINI,  in the last 168 hours  BNP: BNP (last 3 results)  Recent Labs  03/10/13 2117 03/13/13 0747 03/19/13 2123  PROBNP 4061.0* 4669.0* 4611.0*     Other results:    Imaging: No results found.   Medications:     Scheduled Medications: . acetaZOLAMIDE  250 mg Oral BID  . allopurinol  100 mg Oral Daily  . amiodarone  400 mg Oral Daily  . busPIRone  5 mg Oral Q12H  . carvedilol  25 mg Oral BID WC  . docusate sodium  100 mg Oral BID  . guaiFENesin  600 mg Oral BID  . insulin aspart  0-9 Units Subcutaneous TID WC  . OxyCODONE  10 mg Oral Q12H  . pantoprazole  40 mg Oral Daily  . polyethylene glycol  17 g Oral BID  . sodium chloride  3 mL Intravenous Q12H  . torsemide  60 mg Oral BID  . traZODone  50 mg Oral QHS  . Warfarin - Pharmacist Dosing Inpatient   Does not apply q1800    Infusions:    PRN Medications: sodium chloride, acetaminophen, ALPRAZolam, bisacodyl, ipratropium, levalbuterol, menthol-cetylpyridinium, metoCLOPramide (REGLAN) injection, nitroGLYCERIN, ondansetron (ZOFRAN) IV, oxyCODONE, phenol, simethicone, sodium chloride, sodium chloride   Assessment:   1. Acute on chronic diastolic HF  2. R Pleural effusion, large  3. Acute on chronic renal failure with cardiorenal syndrome.  Not candidate for HD.  4. h/o HTN with hypertensive heart disease  5. Obesity, suspect OHS/OSA  6. Chronic atrial fib  - on coumadin  - currently appears to be in atypical atrial flutter.  7. Deconditioning  8. DNR    Plan/Discussion:    Overall stable. I think this is about as good as we are going to get her on an oral regimen. I think she is stable to go to SNF. Plan for D/C to [facility]. Have spoken to Reyne Dumas, RN the admission coordinator at [facility] regarding volume management. They are willing to give her IV lasix if needed. Have asked [facility] to fax weekly weights to HF clinic [phone]. Provided SNF HF order set for  discharge.   Palliative care to follow in SNF for symptom control.     INR 3.3. Hold Coumadin today  Gina Ashley 9:31 AM

## 2013-03-30 NOTE — Progress Notes (Signed)
Occupational Therapy Treatment Patient Details Name: Gina Ashley MRN: [id number] DOB: [birth year] Today's Date: 03/30/2013 Time: 4098-1191 OT Time Calculation (min): 42 min  OT Assessment / Plan / Recommendation Comments on Treatment Session Pt admitted with acute diastolic heart failure.  Making progress but still needs extensive rehab secondary to requiring total-max assist for LB selfcare, sit to stand, and functional transfers.  Plan for SNF later today.            Equipment Recommendations  None recommended by OT       Frequency Min 2X/week   Plan Discharge plan remains appropriate    Precautions / Restrictions Precautions Precautions: Fall Precaution Comments: LLE upright AFO Restrictions Weight Bearing Restrictions: No   Pertinent Vitals/Pain Pt with report of back pain, pt repositioned and nursing notified for pain meds.    ADL  Lower Body Dressing: Maximal assistance;Performed Where Assessed - Lower Body Dressing: Supported sit to stand Toilet Transfer: Simulated;Maximal assistance Toilet Transfer Method: Surveyor, minerals: Other (comment) (to bedside chair) Toileting - Clothing Manipulation and Hygiene: +1 Total assistance Where Assessed - Toileting Clothing Manipulation and Hygiene: Sit to stand from 3-in-1 or toilet Transfers/Ambulation Related to ADLs: Pt required max assist for stand pivot transfer to bedside chair with RW and AFO upright on the left shoe.   ADL Comments: Pt with limited standing endurance.  Only able to tolerate standing for approximately one minute while therapist assisted with toilet hygiene.  Needs total assist for sit to stand from low surfaces secondary to decreased knee flexion in the LLE as well as decreased forward weightshift with transition to standing.      OT Goals ADL Goals Pt Will Perform Lower Body Dressing: with min assist;Sit to stand from chair;Sit to stand from bed;Unsupported;with adaptive equipment ADL  Goal: Lower Body Dressing - Progress: Progressing toward goals Pt Will Transfer to Toilet: with min assist;with DME;Comfort height toilet;Regular height toilet;Grab bars;3-in-1;Ambulation ADL Goal: Toilet Transfer - Progress: Progressing toward goals Pt Will Perform Toileting - Clothing Manipulation: with min assist;Standing ADL Goal: Toileting - Clothing Manipulation - Progress: Progressing toward goals Pt Will Perform Toileting - Hygiene: with mod assist;Sit to stand from 3-in-1/toilet ADL Goal: Toileting - Hygiene - Progress: Progressing toward goals Arm Goals Pt Will Complete Theraband Exer: with supervision, verbal cues required/provided;to maintain strength;1 set;10 reps;Level 1 Theraband;Bilateral upper extremities  Visit Information  Last OT Received On: 03/30/13 Assistance Needed: +1    Subjective Data  Subjective: Do you know what time I'm leaving today?      Cognition  Cognition Arousal/Alertness: Awake/alert Behavior During Therapy: WFL for tasks assessed/performed Overall Cognitive Status: Within Functional Limits for tasks assessed    Mobility  Bed Mobility Bed Mobility: Supine to Sit Supine to Sit: 4: Min guard;HOB elevated Sitting - Scoot to Edge of Bed: 5: Supervision Sit to Sidelying Left: HOB elevated Transfers Transfers: Sit to Stand Sit to Stand: 2: Max assist;From bed Stand to Sit: 2: Max assist;To chair/3-in-1       Higher education careers adviser Sitting Balance Static Sitting - Balance Support: No upper extremity supported Static Sitting - Level of Assistance: 5: Stand by assistance Static Standing Balance Static Standing - Balance Support: Bilateral upper extremity supported Static Standing - Level of Assistance: 2: Max assist   End of Session OT - End of Session Equipment Utilized During Treatment: Gait belt;Other (comment) (left metal upright) Activity Tolerance: Patient limited by fatigue Patient left: in chair;with call bell/phone within reach Nurse  Communication: Mobility status  Mataeo Ingwersen OTR/L Pager number [phone] 03/30/2013, 12:52 PM

## 2013-03-30 NOTE — Progress Notes (Signed)
Pt. Refused CPAP at this time.  Encouraged pt. To call RT if she would like to wear CPAP tonight.

## 2013-04-01 ENCOUNTER — Non-Acute Institutional Stay (SKILLED_NURSING_FACILITY): Payer: Medicare Other | Admitting: Internal Medicine

## 2013-04-01 DIAGNOSIS — E669 Obesity, unspecified: Secondary | ICD-10-CM

## 2013-04-01 DIAGNOSIS — N189 Chronic kidney disease, unspecified: Secondary | ICD-10-CM

## 2013-04-01 DIAGNOSIS — E1169 Type 2 diabetes mellitus with other specified complication: Secondary | ICD-10-CM

## 2013-04-01 DIAGNOSIS — I10 Essential (primary) hypertension: Secondary | ICD-10-CM

## 2013-04-01 DIAGNOSIS — I482 Chronic atrial fibrillation, unspecified: Secondary | ICD-10-CM

## 2013-04-01 DIAGNOSIS — I5033 Acute on chronic diastolic (congestive) heart failure: Secondary | ICD-10-CM

## 2013-04-01 DIAGNOSIS — J449 Chronic obstructive pulmonary disease, unspecified: Secondary | ICD-10-CM

## 2013-04-01 DIAGNOSIS — F329 Major depressive disorder, single episode, unspecified: Secondary | ICD-10-CM

## 2013-04-01 DIAGNOSIS — E119 Type 2 diabetes mellitus without complications: Secondary | ICD-10-CM

## 2013-04-01 DIAGNOSIS — I4891 Unspecified atrial fibrillation: Secondary | ICD-10-CM

## 2013-04-01 NOTE — Progress Notes (Signed)
Patient ID: Gina Ashley, female   DOB: [birth year], 79 y.o.   MRN: [id number]    PCP: Kimber Relic, MD  Code Status: full code  Allergies  Allergen Reactions  . Morphine Sulfate Other (See Comments)    REACTION: change in personality  . Remeron (Mirtazapine) Other (See Comments)    Altered mental status, lethargy  . Tuna (Fish Allergy) Other (See Comments)    unknown  . Penicillins Rash    Tolerates Ancef.    Chief Complaint: new admit post hospitalization 03/19/13- 03/30/13  HPI:  79 y.o. female resident of the [facility] SNF was admitted to hospital with worsening SOB and increasing edema of both of her legs. She has history of chronic CHF, COPD, DM2, HTN, CKD among others. She had acute on chronic CHF exacerbation causing respiratory failure and required thoracocentesis and aggressive diuresis. Her chronic medical management was continued and once her breathing status imporved, she was sent to the facility for rehabilitation She was seen in her room today. She is breathing comfortable and denies ay complaints this visit. She has started working with therapy team  Review of Systems  Constitutional: Negative for fever, chills and diaphoresis.  HENT: Negative for congestion.   Eyes: Negative for blurred vision.  Respiratory: Negative for cough, shortness of breath and wheezing.   Cardiovascular: Negative for chest pain and palpitations.  Gastrointestinal: Negative for heartburn, nausea, vomiting and abdominal pain.  Genitourinary: Negative for dysuria.  Skin: Negative for rash.  Neurological: Positive for weakness. Negative for dizziness, focal weakness and headaches.  Psychiatric/Behavioral: Negative for depression. The patient does not have insomnia.     Past Medical History  Diagnosis Date  . GERD (gastroesophageal reflux disease)   . Hiatal hernia   . HTN (hypertension)     all her life  . CKD (chronic kidney disease), stage III     GFR: 38  . Atrial fibrillation      since 1996  . Depression   . Chronic diastolic heart failure 08/31/2012  . Tibia fracture     BILATERAL  . Renal failure     acute on chronic   . Respiratory failure     acute on chronic hx of  requiring BIPAP  . COPD (chronic obstructive pulmonary disease)   . Metabolic encephalopathy     hx of   . Renal failure     acute on chronic with need for intermittent dialysis - hx of   . UTI (urinary tract infection)     hx of   . Pleural effusion     right sided now resolved   . CHF (congestive heart failure)     diastolic HF  . Complication of anesthesia     "hard to wake up" (03/11/2013)  . OSA on CPAP   . Type II diabetes mellitus 1980's?  . History of blood transfusion 08/2012    "related to leg OR" (03/11/2013)  . Arthritis     "left arm; lower back, hips, hands" (03/11/2013)  . Chronic lower back pain     "disc pops in and out" (03/11/2013)  . Gout   . Anxiety   . Fall 08/2012    "in nursing home" (03/11/2013)  . Anemia   . Chronic a-fib 03/12/2013   Past Surgical History  Procedure Laterality Date  . Bunionectomy Bilateral     bilateral  . Cholecystectomy    . Shoulder arthroscopy w/ rotator cuff repair Left 2002  . Total knee arthroplasty Bilateral 2001  .  Hammer toe surgery Bilateral 2004  . Colonoscopy  07/11/2011    Procedure: COLONOSCOPY;  Surgeon: Malissa Hippo, MD;  Location: [facility];  Service: Endoscopy;  Laterality: N/A;  3:00  . Orif periprosthetic fracture  09/08/2012    Procedure: OPEN REDUCTION INTERNAL FIXATION (ORIF) PERIPROSTHETIC FRACTURE;  Surgeon: Shelda Pal, MD;  Location: [facility]S;  Service: Orthopedics;  Laterality: Right;  ORIF periprosthetic right proximal femur fracture   . External fixation leg  09/08/2012    Procedure: EXTERNAL FIXATION LEG;  Surgeon: Shelda Pal, MD;  Location: [facility];  Service: Orthopedics;  Laterality: Left;  . Joint replacement    . External fixation leg  11/30/2012    Procedure: EXTERNAL FIXATION LEG;  Surgeon:  Shelda Pal, MD;  Location: [facility];  Service: Orthopedics;  Laterality: Left;  Removal of External Fixation Left Knee with Evaluation with Floroscopy  . Tee without cardioversion N/A 01/19/2013    Procedure: TRANSESOPHAGEAL ECHOCARDIOGRAM (TEE);  Surgeon: Lewayne Bunting, MD;  Location[facility]COPY;  Service: Cardiovascular;  Laterality: N/A;  . Shoulder open rotator cuff repair Left 2002    "maybe 3 months after scope" (03/11/2013)  . Femur fracture surgery Right 08/2012    "fell at nursing home" (03/11/2013)  . Tibia fracture surgery Left 08/2012    "fell at nursing home" (03/11/2013)   Social History:   reports that she has never smoked. She has never used smokeless tobacco. She reports that she does not drink alcohol or use illicit drugs.  Family History  Problem Relation Age of Onset  . Colon cancer Brother     Medications: Patient's Medications  New Prescriptions   No medications on file  Previous Medications   ACETAMINOPHEN (TYLENOL) 325 MG TABLET    Take 650 mg by mouth every 6 (six) hours as needed for pain.   ACETAZOLAMIDE (DIAMOX) 250 MG TABLET    Take 1 tablet (250 mg total) by mouth daily. Treatment for 3 days   ALBUTEROL (PROVENTIL) (2.5 MG/3ML) 0.083% NEBULIZER SOLUTION    Take 3 mLs (2.5 mg total) by nebulization every 2 (two) hours as needed for wheezing or shortness of breath.   ALLOPURINOL (ZYLOPRIM) 100 MG TABLET    Take 100 mg by mouth daily.   ALPRAZOLAM (XANAX) 0.25 MG TABLET    Take 1 tablet (0.25 mg total) by mouth every 8 (eight) hours as needed for anxiety.   AMIODARONE (PACERONE) 200 MG TABLET    Take 400 mg by mouth daily.   BUSPIRONE (BUSPAR) 5 MG TABLET    Take 1 tablet (5 mg total) by mouth every 12 (twelve) hours.   CARVEDILOL (COREG) 25 MG TABLET    Take 25 mg by mouth 2 (two) times daily with a meal.   DIPHENHYDRAMINE (BENADRYL) 25 MG CAPSULE    Take 25 mg by mouth every 8 (eight) hours as needed for itching.   DOCUSATE SODIUM (COLACE) 100 MG CAPSULE     Take 100 mg by mouth every 8 (eight) hours.    GUAIFENESIN (MUCINEX) 600 MG 12 HR TABLET    Take 600 mg by mouth 2 (two) times daily.    INSULIN ASPART (NOVOLOG) 100 UNIT/ML INJECTION    Inject 3 Units into the skin 3 (three) times daily with meals. If cbg is greater than 150   INSULIN GLARGINE (LANTUS) 100 UNIT/ML INJECTION    Inject 5 Units into the skin at bedtime.   MULTIPLE VITAMIN (MULTIVITAMIN WITH MINERALS) TABS  Take 1 tablet by mouth daily.   NITROGLYCERIN (NITROSTAT) 0.4 MG SL TABLET    Place 0.4 mg under the tongue every 5 (five) minutes as needed for chest pain.   OMEPRAZOLE (PRILOSEC) 20 MG CAPSULE    Take 40 mg by mouth daily.   ONDANSETRON (ZOFRAN) 4 MG TABLET    Take 1 tablet (4 mg total) by mouth every 6 (six) hours as needed for nausea.   OXYCODONE (OXY IR/ROXICODONE) 5 MG IMMEDIATE RELEASE TABLET    Take 1 tablet (5 mg total) by mouth every 4 (four) hours as needed for pain (breakthrough).   OXYCODONE (OXYCONTIN) 10 MG T12A    Take 1 tablet (10 mg total) by mouth every 12 (twelve) hours.   POLYETHYLENE GLYCOL (MIRALAX / GLYCOLAX) PACKET    Take 17 g by mouth 2 (two) times daily.    SACCHAROMYCES BOULARDII (FLORASTOR) 250 MG CAPSULE    Take 1 capsule (250 mg total) by mouth 2 (two) times daily.   SIMETHICONE (MYLICON) 80 MG CHEWABLE TABLET    Chew 80 mg by mouth every 6 (six) hours as needed for flatulence.   TORSEMIDE (DEMADEX) 20 MG TABLET    Take 3 tablets (60 mg total) by mouth 2 (two) times daily.   TRAZODONE (DESYREL) 50 MG TABLET    Take 50 mg by mouth at bedtime.   WARFARIN (COUMADIN) 3 MG TABLET    Take 1 tablet (3 mg total) by mouth daily.  Modified Medications   No medications on file  Discontinued Medications   No medications on file    Physical Exam: Filed Vitals:   04/01/13 1746  BP: 130/79  Pulse: 80  Temp: 97.6 F (36.4 C)  Resp: 20  Height: 5\' 5"  (1.651 m)  Weight: 220 lb (99.791 kg)  SpO2: 97%   General: Elderly chronically ill  appearing. HEENT: normocephalic, MMM, perrla Neck: supple. JVD +. Carotids 2+ bilaterally; no bruits. No lymphadenopathy or thryomegaly CVS: PMI nondisplaced. Regular rate, irregular rhythm. No rubs, gallops or murmurs.   Lungs: Decreased air entry at the bases  Abdomen: soft, nontender, mild distention. No hepatosplenomegaly. No bruits or masses. Good bowel sounds.   Extremities: no cyanosis, clubbing, rash, trace edema   Neuro: alert & orientedx3, cranial nerves grossly intact. moves all 4 extremities w/o difficulty. Affect pleasant. Weakness present  Labs reviewed: Basic Metabolic Panel:  Recent Labs  11/91/47 0157 09/12/12 0456  01/03/13 0800  01/07/13 0615  01/11/13 0500 01/11/13 0810  03/28/13 0412 03/29/13 0435 03/30/13 0625  NA 143 139  < > 140  < > 140  < > 134*  --   < > 134* 133* 134*  K 2.8* 2.9*  < > 3.8  < > 3.5  < > 3.4*  --   < > 4.2 4.2 3.8  CL 101 100  < > 91*  < > 90*  < > 83*  --   < > 89* 88* 88*  CO2 33* 32  < > 40*  < > 44*  < > >45*  --   < > 36* 39* 40*  GLUCOSE 184* 128*  < > 154*  < > 126*  < > 171*  --   < > 157* 228* 124*  BUN 51* 41*  < > 43*  < > 47*  < > 52*  --   < > 64* 65* 62*  CREATININE 1.29* 1.06  < > 1.62*  < > 1.69*  < > 2.10*  --   < >  2.61* 2.77* 2.60*  CALCIUM 9.2 9.1  < > 10.0  < > 9.7  < > 9.9  --   < > 9.8 9.4 9.5  MG 2.2  --   --  2.0  --  1.8  --  2.0 1.9  --   --   --   --   PHOS 2.9 2.7  --  3.4  --   --   --   --   --   --   --   --   --   < > = values in this interval not displayed. Liver Function Tests:  Recent Labs  02/08/13 2139 03/10/13 2117 03/19/13 2123 03/23/13 1801  AST 22 19 18   --   ALT 16 11 10   --   ALKPHOS 150* 121* 109  --   BILITOT 0.3 0.5 0.6  --   PROT 7.7 7.7 7.7 7.8  ALBUMIN 4.2 4.1 4.1  --     Recent Labs  06/14/12 2018 03/19/13 2123  LIPASE 39 20   CBC:  Recent Labs  02/08/13 2139  02/18/13 2108  03/19/13 2123 03/22/13 0036 03/30/13 0625  WBC 6.0  < > 6.1  < > 5.9 5.0 4.1   NEUTROABS 4.6  --  4.7  --  4.5  --   --   HGB 10.0*  < > 9.5*  < > 9.6* 8.9* 8.9*  HCT 31.2*  < > 28.6*  < > 30.5* 27.3* 28.2*  MCV 85.0  < > 82.9  < > 86.6 86.9 87.9  PLT 129*  < > 110*  < > 104* 104* 113*  < > = values in this interval not displayed. Cardiac Enzymes:  Recent Labs  06/14/12 1948 06/15/12 1156  09/09/12 0034  03/21/13 1321 03/21/13 1840 03/22/13 0036  CKTOTAL 48 42  --  62  --   --   --   --   CKMB 1.3 1.2  --  1.9  --   --   --   --   TROPONINI <0.30 <0.30  < > <0.30  < > <0.30 <0.30 <0.30  < > = values in this interval not displayed. BNP: No components found with this basename: POCBNP,  CBG:  Recent Labs  03/29/13 2123 03/30/13 0703 03/30/13 1122  GLUCAP 180* 120* 171*    Radiological Exams: Significant Diagnostic Studies: Dg Chest 2 View  03/22/2013   *RADIOLOGY REPORT*  Clinical Data: Shortness of breath, follow up of effusion  CHEST - 2 VIEW  Comparison: CT chest of 03/21/2013 and chest x-ray of 03/19/2013  Findings: Opacity remains throughout much of the right lung base most consistent with a moderate to large right pleural effusion with atelectasis of the right lower lobe.  A small left pleural effusion remains.  Cardiomegaly is stable and there does appear to be mild pulmonary vascular congestion present.  Deviation of the tracheal air shadow to the right of midline is most consistent with thyroid goiter with a focus of calcification noted in the lower left neck.  IMPRESSION:  1.  Little change to some increase in volume of the right pleural effusion with right basilar atelectasis. 2.  Tiny left pleural effusion. 3.  Cardiomegaly and pulmonary vascular congestion. 4.  Probable thyroid goiter.  Correlate clinically.   Original Report Authenticated By: Dwyane Dee, M.D.   Dg Chest 2 View  03/19/2013   *RADIOLOGY REPORT*  Clinical Data: Chest and abdominal pain  CHEST - 2 VIEW  Comparison:  03/16/2013  Findings: Moderate cardiac enlargement is again  identified. Airspace consolidation within the superior segment of right lower lobe and right middle lobe is again noted and does not appear significantly changed from previous exam.  The left lung is clear. No pleural effusion is identified.  IMPRESSION:  1.  Persistent consolidation involving the right middle lobe and superior segment of right lower lobe. 2.  Cardiac enlargement.   Original Report Authenticated By: Signa Kell, M.D.   Dg Chest 2 View  03/10/2013   *RADIOLOGY REPORT*  Clinical Data: Weakness and chest pain.  No shortness of breath.  CHEST - 2 VIEW  Comparison: 02/18/2013.  Findings: Cardiac enlargement.  Right base effusion appears increased.  Right basilar atelectasis.  No infiltrate or overt edema.  Degenerative changes thoracic spine.  IMPRESSION: Increased right pleural effusion.   Original Report Authenticated By: Davonna Belling, M.D.   Ct Chest Wo Contrast  03/21/2013   *RADIOLOGY REPORT*  Clinical Data: Shortness of breath  CT CHEST WITHOUT CONTRAST  Technique:  Multidetector CT imaging of the chest was performed following the standard protocol without IV contrast.  Comparison: CT abdomen pelvis 05/22/2012  Findings: The thyroid gland is enlarged, left lobe greater than right. There are several calcifications and multiple nodules within the left thyroid lobe.  One of the calcifications is very dense with calcified and measures 14 mm.  There is some sternal extension of the left lobe the thyroid gland.  Findings are most consistent with thyroid goiter.  There is a moderate to large right pleural effusion and a small left pleural effusion.  These effusions appear simple.  There is moderate cardiomegaly, with dilatation of both atria.  Coronary artery calcifications are present.  There is significant collapse / consolidation of the right lower lobe, overlying the pleural effusion.  There is scattered areas of atelectasis bilaterally.  There are scattered nonpathologically enlarged  mediastinal lymph nodes. Sensitivity for the evaluation for hilar lymphadenopathy is decreased without intravenous contrast.  The thoracic aorta is normal in caliber.  There are extensive degenerative changes of the thoracic spine. Thoracic spine vertebral bodies are normal in height and alignment.  Imaging of the upper abdomen demonstrates a moderate degree of abdominal ascites, most prominent adjacent to the liver.  IMPRESSION:  1.  Moderate to large right and small left pleural effusions. There is extensive collapse and/or consolidation of the right lower lobe. 2. Moderate cardiomegaly with coronary artery atherosclerosis. 3.  Findings suggestive of thyroid goiter. 4.  Abdominal ascites.   Original Report Authenticated By: Britta Mccreedy, M.D.   Dg Chest Port 1 View  03/23/2013   *RADIOLOGY REPORT*  Clinical Data: Post thoracentesis  PORTABLE CHEST - 1 VIEW  Comparison: 03/22/2013  Findings: Decreased right pleural effusion which is now very small following thoracentesis.  No pneumothorax.  Bibasilar atelectasis.  Mild vascular congestion is unchanged.  IMPRESSION: Decreased right pleural effusion following thoracentesis.  No pneumothorax.   Original Report Authenticated By: Janeece Riggers, M.D.   Dg Abd Acute W/chest  03/16/2013   *RADIOLOGY REPORT*  Clinical Data: Shortness breath, abdominal pain, distention.  ACUTE ABDOMEN SERIES (ABDOMEN 2 VIEW & CHEST 1 VIEW)  Comparison: Chest x-ray 03/10/2013.  Abdomen image 10/19/2012.  Findings: There is cardiomegaly with vascular congestion.  Focal right mid and lower lung consolidation with moderate right pleural effusion.  No focal opacity on the left.  Nonobstructive bowel gas pattern.  No free air or organomegaly. Prior cholecystectomy.  Moderate stool burden throughout the colon. No  suspicious calcifications.  No acute bony abnormality.  IMPRESSION: Right mid and lower lung consolidation.  Question pneumonia. Moderate right pleural effusion.  Cardiomegaly,  vascular congestion.  Prior cholecystectomy.   Original Report Authenticated By: Charlett Nose, M.D.   Procedures:  Thoracentesis 5/20  Consultations:  PT/OT  Cardiology service  Palliative care   Assessment/Plan  CHF- has chronic CHF with frequent exacerbations and poor prognosis. Will need daily weight monitoring. Continue carvedilol, torsemide, acetazolamide. Will obtain palliative care consult  Copd- using CPAP machine at bedtime. Continue bronchodilator treatment and monitor clinically  DM2- monitor cbg for now, continue diet and lantus 5 u at bedtime for now with novolog SSI and montior and titrate further if needed  HTN- bp well controlled at present, continue carvedilol  Gout- continue allopurinol, no recent flare ups  depression- will continue alprazolam and buspirone, monitor clinically  Constipation- stable, continue current bowel regimen and monitor given her limited mobility and being on narcotics  afib- rate controlled with lopressor, continue amiodarone and warfarin . Monitor inr, goal 2-3  Chronic pain: will continue pain meds -on oxycontin and oxycodone. Pain well controlled  CKD- with her being on torsemide, will monitor renal function  Family/ staff Communication: reviewed care plan with patient and nursing staff   Labs/tests ordered- cbc, cmp, inr

## 2013-04-02 ENCOUNTER — Encounter: Payer: Self-pay | Admitting: Internal Medicine

## 2013-04-02 DIAGNOSIS — E119 Type 2 diabetes mellitus without complications: Secondary | ICD-10-CM | POA: Insufficient documentation

## 2013-04-02 DIAGNOSIS — E1169 Type 2 diabetes mellitus with other specified complication: Secondary | ICD-10-CM | POA: Insufficient documentation

## 2013-04-02 DIAGNOSIS — E669 Obesity, unspecified: Secondary | ICD-10-CM | POA: Insufficient documentation

## 2013-04-05 ENCOUNTER — Non-Acute Institutional Stay (SKILLED_NURSING_FACILITY): Payer: Medicare Other | Admitting: Internal Medicine

## 2013-04-05 ENCOUNTER — Ambulatory Visit (HOSPITAL_COMMUNITY)
Admit: 2013-04-05 | Discharge: 2013-04-05 | Disposition: A | Discharge status: Home / Self Care | Payer: Medicare Other | Attending: Internal Medicine | Admitting: Internal Medicine

## 2013-04-05 ENCOUNTER — Encounter (HOSPITAL_COMMUNITY): Payer: Self-pay

## 2013-04-05 VITALS — BP 130/72 | HR 80 | Wt 219.4 lb

## 2013-04-05 DIAGNOSIS — Z7901 Long term (current) use of anticoagulants: Secondary | ICD-10-CM | POA: Insufficient documentation

## 2013-04-05 DIAGNOSIS — J4489 Other specified chronic obstructive pulmonary disease: Secondary | ICD-10-CM | POA: Insufficient documentation

## 2013-04-05 DIAGNOSIS — I482 Chronic atrial fibrillation, unspecified: Secondary | ICD-10-CM

## 2013-04-05 DIAGNOSIS — G4733 Obstructive sleep apnea (adult) (pediatric): Secondary | ICD-10-CM | POA: Insufficient documentation

## 2013-04-05 DIAGNOSIS — I129 Hypertensive chronic kidney disease with stage 1 through stage 4 chronic kidney disease, or unspecified chronic kidney disease: Secondary | ICD-10-CM | POA: Insufficient documentation

## 2013-04-05 DIAGNOSIS — I5032 Chronic diastolic (congestive) heart failure: Secondary | ICD-10-CM | POA: Insufficient documentation

## 2013-04-05 DIAGNOSIS — I4891 Unspecified atrial fibrillation: Secondary | ICD-10-CM | POA: Insufficient documentation

## 2013-04-05 DIAGNOSIS — E119 Type 2 diabetes mellitus without complications: Secondary | ICD-10-CM | POA: Insufficient documentation

## 2013-04-05 DIAGNOSIS — J449 Chronic obstructive pulmonary disease, unspecified: Secondary | ICD-10-CM | POA: Insufficient documentation

## 2013-04-05 DIAGNOSIS — N189 Chronic kidney disease, unspecified: Secondary | ICD-10-CM | POA: Insufficient documentation

## 2013-04-05 NOTE — Progress Notes (Signed)
Patient ID: Gina Ashley, female   DOB: 04/30/1927, 79 y.o.   MRN: [id number]  Weight Range   Baseline proBNP     HPI: Gina Ashley is an 79 y.o. female with history of chronic atrial fibrillation on coumadin, diastolic heart failure and HTN. She also has DM type 2, COPD, OSA on CPAP, A Fib- chronic coumadin, and CKD failure with baseline Cr 2.2. She has had 5 hospitalizations in the last 6 months, 1 included tibia/fibula fracture, 1 for UTI, and the rest have been due to massive fluid overload in the setting of diastolic heart failure. Per nephrology she is not a candidate for HD.   Admitted March 2014 with massive volume overload. Weight 280 pounds. She required Milrinone and Dopamine. Permanent atrial fib and went for TEE/DCCV on 3/18 but unable to complete due to smoke. Continued on amio for rate control and coumadin. Evaluated by Nephrology due to renal failure and she was not a candidate for HD. Discharged to SNF. Discharge weight 212 pounds.   Discharged from [facility] 03/17/13 after being diuresed with Lasix drip and later transitioned to 60 mg of Torsemide daily. She was discharged back to SNF, [facility]. Discharge weight 217 pounds.   Admitted to [facility]  03/20/13 with increased dyspnea and lower extremity edema. Diuresed with IV lasix and transitioned to Demadex 60 mg bid. She received R thoracentesis.  Discharge weight 217 pounds. Discharged to [facility] 03/30/13. Palliative Care to follow at SNF.   She returns for post hospital follow up with her daughter. Says she is breathing better. Denies PND/Orthopnea. Weight at SNF 219 pounds.  She continues to use CPAP. Remains on 2 liters [place]. Working with PT/OT. Walking with PT. Says she is getting salt from the diet at the SNF. Limiting fluid intake to < 1.5 liters per day.       ROS: All systems negative except as listed in HPI, PMH and Problem List.  Past Medical History  Diagnosis Date  . GERD (gastroesophageal reflux disease)   . Hiatal  hernia   . HTN (hypertension)     all her life  . CKD (chronic kidney disease), stage III     GFR: 38  . Atrial fibrillation     since 1996  . Depression   . Chronic diastolic heart failure 08/31/2012  . Tibia fracture     BILATERAL  . Renal failure     acute on chronic   . Respiratory failure     acute on chronic hx of  requiring BIPAP  . COPD (chronic obstructive pulmonary disease)   . Metabolic encephalopathy     hx of   . Renal failure     acute on chronic with need for intermittent dialysis - hx of   . UTI (urinary tract infection)     hx of   . Pleural effusion     right sided now resolved   . CHF (congestive heart failure)     diastolic HF  . Complication of anesthesia     "hard to wake up" (03/11/2013)  . OSA on CPAP   . Type II diabetes mellitus 1980's?  . History of blood transfusion 08/2012    "related to leg OR" (03/11/2013)  . Arthritis     "left arm; lower back, hips, hands" (03/11/2013)  . Chronic lower back pain     "disc pops in and out" (03/11/2013)  . Gout   . Anxiety   . Fall 08/2012    "  in nursing home" (03/11/2013)  . Anemia   . Chronic a-fib 03/12/2013    Current Outpatient Prescriptions  Medication Sig Dispense Refill  . acetaminophen (TYLENOL) 325 MG tablet Take 650 mg by mouth every 6 (six) hours as needed for pain.      Marland Kitchen albuterol (PROVENTIL) (2.5 MG/3ML) 0.083% nebulizer solution Take 3 mLs (2.5 mg total) by nebulization every 2 (two) hours as needed for wheezing or shortness of breath.  75 mL  12  . allopurinol (ZYLOPRIM) 100 MG tablet Take 100 mg by mouth daily.      Marland Kitchen ALPRAZolam (XANAX) 0.25 MG tablet Take 1 tablet (0.25 mg total) by mouth every 8 (eight) hours as needed for anxiety.  30 tablet  0  . amiodarone (PACERONE) 200 MG tablet Take 400 mg by mouth daily.      . busPIRone (BUSPAR) 5 MG tablet Take 1 tablet (5 mg total) by mouth every 12 (twelve) hours.  30 tablet  0  . carvedilol (COREG) 25 MG tablet Take 25 mg by mouth 2 (two) times  daily with a meal.      . diphenhydrAMINE (BENADRYL) 25 mg capsule Take 25 mg by mouth every 8 (eight) hours as needed for itching.      . docusate sodium (COLACE) 100 MG capsule Take 100 mg by mouth every 8 (eight) hours.       Marland Kitchen guaiFENesin (MUCINEX) 600 MG 12 hr tablet Take 600 mg by mouth 2 (two) times daily.       . insulin aspart (NOVOLOG) 100 UNIT/ML injection Inject 3 Units into the skin 3 (three) times daily with meals. If cbg is greater than 150      . insulin glargine (LANTUS) 100 UNIT/ML injection Inject 5 Units into the skin at bedtime.      . Multiple Vitamin (MULTIVITAMIN WITH MINERALS) TABS Take 1 tablet by mouth daily.      . nitroGLYCERIN (NITROSTAT) 0.4 MG SL tablet Place 0.4 mg under the tongue every 5 (five) minutes as needed for chest pain.      Marland Kitchen omeprazole (PRILOSEC) 20 MG capsule Take 40 mg by mouth daily.      . ondansetron (ZOFRAN) 4 MG tablet Take 1 tablet (4 mg total) by mouth every 6 (six) hours as needed for nausea.  20 tablet    . oxyCODONE (OXY IR/ROXICODONE) 5 MG immediate release tablet Take 1 tablet (5 mg total) by mouth every 4 (four) hours as needed for pain (breakthrough).  30 tablet  0  . OxyCODONE (OXYCONTIN) 10 mg T12A Take 1 tablet (10 mg total) by mouth every 12 (twelve) hours.  30 tablet  0  . polyethylene glycol (MIRALAX / GLYCOLAX) packet Take 17 g by mouth 2 (two) times daily.       Marland Kitchen saccharomyces boulardii (FLORASTOR) 250 MG capsule Take 1 capsule (250 mg total) by mouth 2 (two) times daily.      . simethicone (MYLICON) 80 MG chewable tablet Chew 80 mg by mouth every 6 (six) hours as needed for flatulence.      . torsemide (DEMADEX) 20 MG tablet Take 3 tablets (60 mg total) by mouth 2 (two) times daily.      . traZODone (DESYREL) 50 MG tablet Take 50 mg by mouth at bedtime.      Marland Kitchen warfarin (COUMADIN) 3 MG tablet Take 1 tablet (3 mg total) by mouth daily.       No current facility-administered medications for this encounter.  PHYSICAL  EXAM: Filed Vitals:   04/05/13 1101  BP: 130/72  Pulse: 80  Weight: 219 lb 6.4 oz (99.519 kg)  SpO2: 95%    General:  Chronically ill. Elderly sitting in wheel chair.  HEENT: normal Neck: supple. JVP ~10-11. Carotids 2+ bilaterally; no bruits. No lymphadenopathy or thryomegaly appreciated. Cor: PMI normal. Regular rate & rhythm. No rubs, gallops or murmurs. Lungs: clear Abdomen: obese, soft, nontender, nondistended. No hepatosplenomegaly. No bruits or masses. Good bowel sounds. Extremities: no cyanosis, clubbing, rash,  R and LLE ted hose. LLE brace edema Neuro: alert & orientedx3, cranial nerves grossly intact. Moves all 4 extremities w/o difficulty. Affect pleasant.      ASSESSMENT & PLAN:

## 2013-04-05 NOTE — Progress Notes (Signed)
Patient ID: Gina Ashley, female   DOB: 07-03-1927, 79 y.o.   MRN: [id number]  AV- coumadin management  hpi-    79 y.o. female resident with history of chronic CHF, COPD, DM2, HTN, CKD here for rehabilitation is on coumadin for her afib. inr today 2.85. No reports of bleeding or bruising.  Exam- vss  General: Elderly chronically ill appearing. HEENT: normocephalic, MMM, perrla Neck: supple. JVD +. Carotids 2+ bilaterally; no bruits. No lymphadenopathy or thryomegaly CVS: PMI nondisplaced. Regular rate, irregular rhythm. No rubs, gallops or murmurs.   Lungs: Decreased air entry at the bases  Abdomen: soft, nontender, mild distention. No hepatosplenomegaly. No bruits or masses. Good bowel sounds.   Extremities: no cyanosis, clubbing, rash, trace edema   Neuro: alert & orientedx3, cranial nerves grossly intact. moves all 4 extremities w/o difficulty.  Lab reviewed  Assessment/plan-  afib- rate controlled with b blocker. On coumadin with inr 2.85 today. contnue 3 mg daily of coumadin and recheck inr 04/12/13. Goal inr 2-3  Long term anticoagulation- monitor inr. Goal 2-3.

## 2013-04-05 NOTE — Assessment & Plan Note (Signed)
Doing well at [facility]. Volume status mildly elevated but stable. Will continue current diuretic regimen. I spoke to [employer], the [employer] and provided her with new instructions to fax weekly weights every Monday and call HF clinic if her weight is 222 pounds or greater. [facility] agreeable to giving IV lasix should it be needed. BMET to be faxed to HF clinic today. Follow up in 2 weeks to reassess volume status.

## 2013-04-06 ENCOUNTER — Other Ambulatory Visit: Payer: Self-pay | Admitting: *Deleted

## 2013-04-06 DIAGNOSIS — Z7901 Long term (current) use of anticoagulants: Secondary | ICD-10-CM | POA: Insufficient documentation

## 2013-04-06 MED ORDER — OXYCODONE HCL 5 MG PO TABS: ORAL_TABLET | ORAL | Status: DC

## 2013-04-06 MED ORDER — ALPRAZOLAM 0.25 MG PO TABS: ORAL_TABLET | ORAL | Status: DC

## 2013-04-07 ENCOUNTER — Telehealth (HOSPITAL_COMMUNITY): Payer: Self-pay | Admitting: *Deleted

## 2013-04-07 NOTE — Telephone Encounter (Signed)
Received call from Donald Pore [phone]) at [facility], pt's wt is up to 224 lb today, she states prior pt had been stable around 219-221, she states pt is asymptomatic, per Dr Gala Romney give 5 mg metolazone now and call back in AM if wt not down, Thayer Ohm is aware and verbalizes understanding

## 2013-04-09 ENCOUNTER — Non-Acute Institutional Stay (SKILLED_NURSING_FACILITY): Payer: Medicare Other | Admitting: Internal Medicine

## 2013-04-09 DIAGNOSIS — H6123 Impacted cerumen, bilateral: Secondary | ICD-10-CM

## 2013-04-09 DIAGNOSIS — H6120 Impacted cerumen, unspecified ear: Secondary | ICD-10-CM

## 2013-04-09 DIAGNOSIS — G8929 Other chronic pain: Secondary | ICD-10-CM

## 2013-04-09 NOTE — Progress Notes (Signed)
Patient ID: Gina Ashley, female   DOB: [birth year], 79 y.o.   MRN: [id number]  AV- pain medications, ears stuffed  Allergies  Allergen Reactions  . Morphine Sulfate Other (See Comments)    REACTION: change in personality  . Remeron (Mirtazapine) Other (See Comments)    Altered mental status, lethargy  . Tuna (Fish Allergy) Other (See Comments)    unknown  . Penicillins Rash    Tolerates Ancef.   HPI- 79 y.o. female resident with history of chronic CHF, COPD, DM2, HTN, CKD is here for STR post hospital admission for chf exacerbation. She has been feeling both her ears to be stuffed and has been having difficulty hearing. Denies any earache or discharge from ears. She also wanted to clarify her pain medications. She was taking hydrocodone-apap prior to hospital admission and tolerated it better than oxycodone. She feels dizzy with the current pain regimen and feels this is strong for her.  Denies any other complaints. She has been working with therapy team  Review of Systems  Constitutional: Negative for fever, chills and diaphoresis.  HENT: Negative for congestion.   Eyes: Negative for blurred vision.  Respiratory: Negative for cough, shortness of breath and wheezing.   Cardiovascular: Negative for chest pain and palpitations.  Gastrointestinal: Negative for heartburn, nausea, vomiting and abdominal pain.  Genitourinary: Negative for dysuria.  Skin: Negative for rash.  Neurological: Positive for weakness. Negative for focal weakness and headaches.  Psychiatric/Behavioral: Negative for depression. The patient does not have insomnia.    Pulse 88  Temp(Src) 97.4 F (36.3 C)  Resp 18  Ht 5\' 5"  (1.651 m)  Wt 223 lb (101.152 kg)  BMI 37.11 kg/m2  SpO2 96%  bp 138/72  General: Elderly chronically ill appearing, obese HEENT: normocephalic, MMM, perrla, otoscope not available in facility today but has cerumen impacted in ear canal, Neck: supple. JVD +. Carotids 2+ bilaterally; no  bruits. No lymphadenopathy or thryomegaly CVS: PMI nondisplaced. Regular rate, irregular rhythm. No rubs, gallops or murmurs.   Lungs: Decreased air entry at the bases   Extremities: no cyanosis, clubbing, rash, trace edema   Neuro: alert & orientedx3, cranial nerves grossly intact. moves all 4 extremities w/o difficulty. Affect pleasant. Weakness present  Assessment/plan-  Impacted cerumen- will have debrox ear drops applied to each ears for 5 days and reassess  Chronic pain- will d/c oxycodone standing and prn orders. Will start her on hydrocodone-apap 5-325 1 tab q4h prn and 7.5/325 daily at bedtime. Reassess.

## 2013-04-13 ENCOUNTER — Other Ambulatory Visit: Payer: Self-pay | Admitting: *Deleted

## 2013-04-13 MED ORDER — HYDROCODONE-ACETAMINOPHEN 7.5-325 MG PO TABS: ORAL_TABLET | ORAL | Status: DC

## 2013-04-22 ENCOUNTER — Ambulatory Visit (HOSPITAL_COMMUNITY)
Admission: RE | Admit: 2013-04-22 | Discharge: 2013-04-22 | Disposition: A | Discharge status: Home / Self Care | Payer: Medicare Other | Source: Ambulatory Visit | Attending: Internal Medicine | Admitting: Internal Medicine

## 2013-04-22 ENCOUNTER — Encounter (HOSPITAL_COMMUNITY): Payer: Self-pay

## 2013-04-22 VITALS — BP 122/80 | HR 75 | Wt 194.0 lb

## 2013-04-22 DIAGNOSIS — K219 Gastro-esophageal reflux disease without esophagitis: Secondary | ICD-10-CM | POA: Insufficient documentation

## 2013-04-22 DIAGNOSIS — M109 Gout, unspecified: Secondary | ICD-10-CM | POA: Insufficient documentation

## 2013-04-22 DIAGNOSIS — I509 Heart failure, unspecified: Secondary | ICD-10-CM | POA: Insufficient documentation

## 2013-04-22 DIAGNOSIS — N189 Chronic kidney disease, unspecified: Secondary | ICD-10-CM | POA: Insufficient documentation

## 2013-04-22 DIAGNOSIS — G4733 Obstructive sleep apnea (adult) (pediatric): Secondary | ICD-10-CM | POA: Insufficient documentation

## 2013-04-22 DIAGNOSIS — G9341 Metabolic encephalopathy: Secondary | ICD-10-CM | POA: Insufficient documentation

## 2013-04-22 DIAGNOSIS — J4489 Other specified chronic obstructive pulmonary disease: Secondary | ICD-10-CM | POA: Insufficient documentation

## 2013-04-22 DIAGNOSIS — I4891 Unspecified atrial fibrillation: Secondary | ICD-10-CM | POA: Insufficient documentation

## 2013-04-22 DIAGNOSIS — I5032 Chronic diastolic (congestive) heart failure: Secondary | ICD-10-CM

## 2013-04-22 DIAGNOSIS — Z7901 Long term (current) use of anticoagulants: Secondary | ICD-10-CM | POA: Insufficient documentation

## 2013-04-22 DIAGNOSIS — M545 Low back pain, unspecified: Secondary | ICD-10-CM | POA: Insufficient documentation

## 2013-04-22 DIAGNOSIS — F411 Generalized anxiety disorder: Secondary | ICD-10-CM | POA: Insufficient documentation

## 2013-04-22 DIAGNOSIS — E119 Type 2 diabetes mellitus without complications: Secondary | ICD-10-CM | POA: Insufficient documentation

## 2013-04-22 DIAGNOSIS — I129 Hypertensive chronic kidney disease with stage 1 through stage 4 chronic kidney disease, or unspecified chronic kidney disease: Secondary | ICD-10-CM | POA: Insufficient documentation

## 2013-04-22 DIAGNOSIS — Z794 Long term (current) use of insulin: Secondary | ICD-10-CM | POA: Insufficient documentation

## 2013-04-22 DIAGNOSIS — G8929 Other chronic pain: Secondary | ICD-10-CM | POA: Insufficient documentation

## 2013-04-22 DIAGNOSIS — I5030 Unspecified diastolic (congestive) heart failure: Secondary | ICD-10-CM | POA: Insufficient documentation

## 2013-04-22 DIAGNOSIS — J449 Chronic obstructive pulmonary disease, unspecified: Secondary | ICD-10-CM | POA: Insufficient documentation

## 2013-04-22 NOTE — Progress Notes (Signed)
Patient ID: Gina Ashley, female   DOB: Dec 17, 1926, 79 y.o.   MRN: [id number]   Weight Range  217 pounds  Baseline proBNP     HPI: [name] is an 79 y.o. female with history of chronic atrial fibrillation on coumadin, diastolic heart failure and HTN. She also has DM type 2, COPD, OSA on CPAP, A Fib- chronic coumadin, and CKD failure with baseline Cr 2.2. She has had 5 hospitalizations in the last 6 months, 1 included tibia/fibula fracture, 1 for UTI, and the rest have been due to massive fluid overload in the setting of diastolic heart failure. Per nephrology she is not a candidate for HD.   Admitted March 2014 with massive volume overload. Weight 280 pounds. She required Milrinone and Dopamine. Permanent atrial fib and went for TEE/DCCV on 3/18 but unable to complete due to smoke. Continued on amio for rate control and coumadin. Evaluated by Nephrology due to renal failure and she was not a candidate for HD. Discharged to SNF. Discharge weight 212 pounds.   Discharged from [facility] 03/17/13 after being diuresed with Lasix drip and later transitioned to 60 mg of Torsemide daily. She was discharged back to SNF, [facility]. Discharge weight 217 pounds.   Admitted to [facility]  03/20/13 with increased dyspnea and lower extremity edema. Diuresed with IV lasix and transitioned to Demadex 60 mg bid. She received R thoracentesis.  Discharge weight 217 pounds. Discharged to [facility] 03/30/13. Palliative Care to follow at SNF.   She returns for follow up with her daughters. Overall she is feeling much better.  Denies PND/Orthopnea. Occasionally dizzy. Weight at [facility] 213-214 pounds.  She continues to use CPAP. Remains on 2 liters [place]. Working with PT/OT. Walking with PT. Now she is able to perform her own bath. Says she is getting salt from the diet at the SNF. Limiting fluid intake to < 1.5 liters per day.       ROS: All systems negative except as listed in HPI, PMH and Problem List.  Past Medical History   Diagnosis Date  . GERD (gastroesophageal reflux disease)   . Hiatal hernia   . HTN (hypertension)     all her life  . CKD (chronic kidney disease), stage III     GFR: 38  . Atrial fibrillation     since 1996  . Depression   . Chronic diastolic heart failure 08/31/2012  . Tibia fracture     BILATERAL  . Renal failure     acute on chronic   . Respiratory failure     acute on chronic hx of  requiring BIPAP  . COPD (chronic obstructive pulmonary disease)   . Metabolic encephalopathy     hx of   . Renal failure     acute on chronic with need for intermittent dialysis - hx of   . UTI (urinary tract infection)     hx of   . Pleural effusion     right sided now resolved   . CHF (congestive heart failure)     diastolic HF  . Complication of anesthesia     "hard to wake up" (03/11/2013)  . OSA on CPAP   . Type II diabetes mellitus 1980's?  . History of blood transfusion 08/2012    "related to leg OR" (03/11/2013)  . Arthritis     "left arm; lower back, hips, hands" (03/11/2013)  . Chronic lower back pain     "disc pops in and out" (03/11/2013)  .  Gout   . Anxiety   . Fall 08/2012    "in nursing home" (03/11/2013)  . Anemia   . Chronic a-fib 03/12/2013    Current Outpatient Prescriptions  Medication Sig Dispense Refill  . acetaminophen (TYLENOL) 325 MG tablet Take 650 mg by mouth every 6 (six) hours as needed for pain.      Marland Kitchen albuterol (PROVENTIL) (2.5 MG/3ML) 0.083% nebulizer solution Take 3 mLs (2.5 mg total) by nebulization every 2 (two) hours as needed for wheezing or shortness of breath.  75 mL  12  . allopurinol (ZYLOPRIM) 100 MG tablet Take 100 mg by mouth daily.      Marland Kitchen ALPRAZolam (XANAX) 0.25 MG tablet Take one half tablet by mouth every 8 hours as needed for anxiety  45 tablet  5  . amiodarone (PACERONE) 200 MG tablet Take 400 mg by mouth daily.      . busPIRone (BUSPAR) 5 MG tablet Take 1 tablet (5 mg total) by mouth every 12 (twelve) hours.  30 tablet  0  . carvedilol  (COREG) 25 MG tablet Take 25 mg by mouth 2 (two) times daily with a meal.      . diphenhydrAMINE (BENADRYL) 25 mg capsule Take 25 mg by mouth every 8 (eight) hours as needed for itching.      . docusate sodium (COLACE) 100 MG capsule Take 100 mg by mouth every 8 (eight) hours.       Marland Kitchen guaiFENesin (MUCINEX) 600 MG 12 hr tablet Take 600 mg by mouth 2 (two) times daily.       Marland Kitchen HYDROcodone-acetaminophen (NORCO) 7.5-325 MG per tablet Take one tablet at bedtime  30 tablet  5  . insulin aspart (NOVOLOG) 100 UNIT/ML injection Inject 3 Units into the skin 3 (three) times daily with meals. If cbg is greater than 150      . insulin glargine (LANTUS) 100 UNIT/ML injection Inject 5 Units into the skin at bedtime.      . Multiple Vitamin (MULTIVITAMIN WITH MINERALS) TABS Take 1 tablet by mouth daily.      . nitroGLYCERIN (NITROSTAT) 0.4 MG SL tablet Place 0.4 mg under the tongue every 5 (five) minutes as needed for chest pain.      Marland Kitchen omeprazole (PRILOSEC) 20 MG capsule Take 40 mg by mouth daily.      . ondansetron (ZOFRAN) 4 MG tablet Take 1 tablet (4 mg total) by mouth every 6 (six) hours as needed for nausea.  20 tablet    . oxyCODONE (OXY IR/ROXICODONE) 5 MG immediate release tablet Take one tablet by mouth every 4 hours as needed for pain  180 tablet  0  . OxyCODONE (OXYCONTIN) 10 mg T12A Take 1 tablet (10 mg total) by mouth every 12 (twelve) hours.  30 tablet  0  . polyethylene glycol (MIRALAX / GLYCOLAX) packet Take 17 g by mouth 2 (two) times daily.       Marland Kitchen saccharomyces boulardii (FLORASTOR) 250 MG capsule Take 1 capsule (250 mg total) by mouth 2 (two) times daily.      . simethicone (MYLICON) 80 MG chewable tablet Chew 80 mg by mouth every 6 (six) hours as needed for flatulence.      . torsemide (DEMADEX) 20 MG tablet Take 3 tablets (60 mg total) by mouth 2 (two) times daily.      . traZODone (DESYREL) 50 MG tablet Take 50 mg by mouth at bedtime.      Marland Kitchen warfarin (COUMADIN) 3 MG tablet Take  1 tablet (3  mg total) by mouth daily.       No current facility-administered medications for this encounter.     PHYSICAL EXAM: Filed Vitals:   04/22/13 1408  BP: 122/80  Pulse: 75  Weight: 194 lb (87.998 kg)  SpO2: 100%    General:  Chronically ill. Elderly sitting in wheel chair with her daughters HEENT: normal Neck: supple. JVP 6-7. Carotids 2+ bilaterally; no bruits. No lymphadenopathy or thryomegaly appreciated. Cor: PMI normal. Regular rate & rhythm. No rubs, gallops or murmurs. Lungs: clear Abdomen: obese, soft, nontender, nondistended. No hepatosplenomegaly. No bruits or masses. Good bowel sounds. Extremities: no cyanosis, clubbing, rash,  R and LLE ted hose. LLE brace edema Neuro: alert & orientedx3, cranial nerves grossly intact. Moves all 4 extremities w/o difficulty. Affect pleasant.      ASSESSMENT & PLAN:

## 2013-04-22 NOTE — Assessment & Plan Note (Signed)
Overall she is much better. Volume status stable. Continue current diuretic regimen. Continue daily weights at SNF and fax to HF clinic weekly. Follow up in 3-4 weeks.

## 2013-04-30 ENCOUNTER — Non-Acute Institutional Stay (SKILLED_NURSING_FACILITY): Payer: Medicare Other | Admitting: Internal Medicine

## 2013-04-30 DIAGNOSIS — I482 Chronic atrial fibrillation, unspecified: Secondary | ICD-10-CM

## 2013-04-30 DIAGNOSIS — F32A Depression, unspecified: Secondary | ICD-10-CM

## 2013-04-30 DIAGNOSIS — R5381 Other malaise: Secondary | ICD-10-CM

## 2013-04-30 DIAGNOSIS — E669 Obesity, unspecified: Secondary | ICD-10-CM

## 2013-04-30 DIAGNOSIS — F329 Major depressive disorder, single episode, unspecified: Secondary | ICD-10-CM

## 2013-04-30 DIAGNOSIS — G4700 Insomnia, unspecified: Secondary | ICD-10-CM

## 2013-04-30 DIAGNOSIS — I4891 Unspecified atrial fibrillation: Secondary | ICD-10-CM

## 2013-04-30 DIAGNOSIS — J449 Chronic obstructive pulmonary disease, unspecified: Secondary | ICD-10-CM

## 2013-04-30 DIAGNOSIS — R531 Weakness: Secondary | ICD-10-CM

## 2013-04-30 DIAGNOSIS — I5032 Chronic diastolic (congestive) heart failure: Secondary | ICD-10-CM

## 2013-04-30 DIAGNOSIS — Z7901 Long term (current) use of anticoagulants: Secondary | ICD-10-CM

## 2013-04-30 DIAGNOSIS — J309 Allergic rhinitis, unspecified: Secondary | ICD-10-CM

## 2013-04-30 DIAGNOSIS — E1169 Type 2 diabetes mellitus with other specified complication: Secondary | ICD-10-CM

## 2013-04-30 DIAGNOSIS — E119 Type 2 diabetes mellitus without complications: Secondary | ICD-10-CM

## 2013-04-30 NOTE — Progress Notes (Signed)
Patient ID: Gina Ashley, female   DOB: [birth year], 79 y.o.   MRN: [id number]  [facility]  Code Status: full code  Allergies  Allergen Reactions  . Morphine Sulfate Other (See Comments)    REACTION: change in personality  . Remeron (Mirtazapine) Other (See Comments)    Altered mental status, lethargy  . Tuna (Fish Allergy) Other (See Comments)    unknown  . Penicillins Rash    Tolerates Ancef.    Chief Complaint  Patient presents with  . Medical Managment of Chronic Issues    HPI:  79 y/o female patient with hx of chf, copd, dm, htn, ckd here for STR was seen today on routine visit.  She complaints of having increased oral secretions and feeling stuffed in her sinuses. Denies any sore throat or runny nose. Has white phlegm with cough. As per staff has weight fluctuation. Followed by CHF clinic. Non compliant with fluid restriction and diet in facility No other concerns Compliant with her medication working with therapy team  Review of Systems  Constitutional: Negative for fever, chills and diaphoresis.  HENT: Negative for sore throat Eyes: Negative for blurred vision.  Respiratory: Negative for shortness of breath and wheezing.   Cardiovascular: Negative for chest pain and palpitations.  Gastrointestinal: Negative for heartburn, nausea, vomiting and abdominal pain.  Genitourinary: Negative for dysuria.  Skin: Negative for rash.  Neurological: Positive for weakness. Negative for dizziness, focal weakness and headaches.  Psychiatric/Behavioral: Negative for depression. The patient does not have insomnia.     Past Medical History  Diagnosis Date  . GERD (gastroesophageal reflux disease)   . Hiatal hernia   . HTN (hypertension)     all her life  . CKD (chronic kidney disease), stage III     GFR: 38  . Atrial fibrillation     since 1996  . Depression   . Chronic diastolic heart failure 08/31/2012  . Tibia fracture     BILATERAL  . Renal failure      acute on chronic   . Respiratory failure     acute on chronic hx of  requiring BIPAP  . COPD (chronic obstructive pulmonary disease)   . Metabolic encephalopathy     hx of   . Renal failure     acute on chronic with need for intermittent dialysis - hx of   . UTI (urinary tract infection)     hx of   . Pleural effusion     right sided now resolved   . CHF (congestive heart failure)     diastolic HF  . Complication of anesthesia     "hard to wake up" (03/11/2013)  . OSA on CPAP   . Type II diabetes mellitus 1980's?  . History of blood transfusion 08/2012    "related to leg OR" (03/11/2013)  . Arthritis     "left arm; lower back, hips, hands" (03/11/2013)  . Chronic lower back pain     "disc pops in and out" (03/11/2013)  . Gout   . Anxiety   . Fall 08/2012    "in nursing home" (03/11/2013)  . Anemia   . Chronic a-fib 03/12/2013   Past Surgical History  Procedure Laterality Date  . Bunionectomy Bilateral     bilateral  . Cholecystectomy    . Shoulder arthroscopy w/ rotator cuff repair Left 2002  . Total knee arthroplasty Bilateral 2001  . Hammer toe surgery Bilateral 2004  . Colonoscopy  07/11/2011    Procedure: COLONOSCOPY;  Surgeon: Malissa Hippo, MD;  [facility];  Service: Endoscopy;  Laterality: N/A;  3:00  . Orif periprosthetic fracture  09/08/2012    Procedure: OPEN REDUCTION INTERNAL FIXATION (ORIF) PERIPROSTHETIC FRACTURE;  Surgeon: Shelda Pal, MD;  Location: [facility];  Service: Orthopedics;  Laterality: Right;  ORIF periprosthetic right proximal femur fracture   . External fixation leg  09/08/2012    Procedure: EXTERNAL FIXATION LEG;  Surgeon: Shelda Pal, MD;  Location: [facility];  Service: Orthopedics;  Laterality: Left;  . Joint replacement    . External fixation leg  11/30/2012    Procedure: EXTERNAL FIXATION LEG;  Surgeon: Shelda Pal, MD;  Location: [facility];  Service: Orthopedics;  Laterality: Left;  Removal of External Fixation Left Knee with Evaluation  with Floroscopy  . Tee without cardioversion N/A 01/19/2013    Procedure: TRANSESOPHAGEAL ECHOCARDIOGRAM (TEE);  Surgeon: Lewayne Bunting, MD;  Location: [facility];  Service: Cardiovascular;  Laterality: N/A;  . Shoulder open rotator cuff repair Left 2002    "maybe 3 months after scope" (03/11/2013)  . Femur fracture surgery Right 08/2012    "fell at nursing home" (03/11/2013)  . Tibia fracture surgery Left 08/2012    "fell at nursing home" (03/11/2013)   Social History:   reports that she has never smoked. She has never used smokeless tobacco. She reports that she does not drink alcohol or use illicit drugs.  Family History  Problem Relation Age of Onset  . Colon cancer Brother     Medications: Patient's Medications  New Prescriptions   No medications on file  Previous Medications   ACETAMINOPHEN (TYLENOL) 325 MG TABLET    Take 650 mg by mouth every 6 (six) hours as needed for pain.   ALBUTEROL (PROVENTIL) (2.5 MG/3ML) 0.083% NEBULIZER SOLUTION    Take 3 mLs (2.5 mg total) by nebulization every 2 (two) hours as needed for wheezing or shortness of breath.   ALLOPURINOL (ZYLOPRIM) 100 MG TABLET    Take 100 mg by mouth daily.   ALPRAZOLAM (XANAX) 0.25 MG TABLET    Take one half tablet by mouth every 8 hours as needed for anxiety   AMIODARONE (PACERONE) 200 MG TABLET    Take 400 mg by mouth daily.   BUSPIRONE (BUSPAR) 5 MG TABLET    Take 1 tablet (5 mg total) by mouth every 12 (twelve) hours.   CARVEDILOL (COREG) 25 MG TABLET    Take 25 mg by mouth 2 (two) times daily with a meal.   DOCUSATE SODIUM (COLACE) 100 MG CAPSULE    Take 100 mg by mouth every 8 (eight) hours.    GUAIFENESIN (MUCINEX) 600 MG 12 HR TABLET    Take 600 mg by mouth 2 (two) times daily.    HYDROCODONE-ACETAMINOPHEN (NORCO) 7.5-325 MG PER TABLET    Take 1 tablet by mouth at bedtime.   INSULIN ASPART (NOVOLOG) 100 UNIT/ML INJECTION    Inject 5 Units into the skin 3 (three) times daily with meals. If cbg is greater than  150   INSULIN GLARGINE (LANTUS) 100 UNIT/ML INJECTION    Inject 5 Units into the skin at bedtime.   MELATONIN 3 MG TABS    Take 1 tablet by mouth daily.   MULTIPLE VITAMIN (MULTIVITAMIN WITH MINERALS) TABS    Take 1 tablet by mouth daily.   NITROGLYCERIN (NITROSTAT) 0.4 MG SL TABLET    Place 0.4 mg under the tongue every 5 (five) minutes as needed for chest  pain.   OMEPRAZOLE (PRILOSEC) 20 MG CAPSULE    Take 40 mg by mouth daily.   ONDANSETRON (ZOFRAN) 4 MG TABLET    Take 1 tablet (4 mg total) by mouth every 6 (six) hours as needed for nausea.   POLYETHYLENE GLYCOL (MIRALAX / GLYCOLAX) PACKET    Take 17 g by mouth 2 (two) times daily.    SIMETHICONE (MYLICON) 80 MG CHEWABLE TABLET    Chew 80 mg by mouth every 6 (six) hours as needed for flatulence.   TORSEMIDE (DEMADEX) 20 MG TABLET    Take 3 tablets (60 mg total) by mouth 2 (two) times daily.   TRAZODONE (DESYREL) 50 MG TABLET    Take 50 mg by mouth at bedtime.  Modified Medications   Modified Medication Previous Medication   WARFARIN (COUMADIN) 3 MG TABLET warfarin (COUMADIN) 3 MG tablet      Take 1 mg by mouth daily. 2.5 mg and 3 mg alternating day dose    Take 1 tablet (3 mg total) by mouth daily.  Discontinued Medications   DIPHENHYDRAMINE (BENADRYL) 25 MG CAPSULE    Take 25 mg by mouth every 8 (eight) hours as needed for itching.   HYDROCODONE-ACETAMINOPHEN (NORCO) 7.5-325 MG PER TABLET    Take one tablet at bedtime   OXYCODONE (OXY IR/ROXICODONE) 5 MG IMMEDIATE RELEASE TABLET    Take one tablet by mouth every 4 hours as needed for pain   OXYCODONE (OXYCONTIN) 10 MG T12A    Take 1 tablet (10 mg total) by mouth every 12 (twelve) hours.   SACCHAROMYCES BOULARDII (FLORASTOR) 250 MG CAPSULE    Take 1 capsule (250 mg total) by mouth 2 (two) times daily.     Physical Exam: Filed Vitals:   04/30/13 1454  BP: 112/51  Pulse: 59  Temp: 97.7 F (36.5 C)  Resp: 20  Height: 5\' 5"  (1.651 m)  Weight: 196 lb (88.905 kg)  SpO2: 96%    General: Elderly chronically ill appearing. HEENT: normocephalic, MMM, PERRLA, no sinus tenderness Neck: supple. JVD +. Carotids 2+ bilaterally; no bruits. No lymphadenopathy or thryomegaly CVS: PMI nondisplaced. Regular rate, irregular rhythm. No rubs, gallops or murmurs.   Lungs: Decreased air entry at the bases but no wheeze, rhonchi or crackles Abdomen: soft, nontender, mild distention. No hepatosplenomegaly. No bruits or masses. Good bowel sounds.   Extremities: no cyanosis, clubbing, rash, trace edema   Neuro: alert & orientedx3, cranial nerves grossly intact. moves all 4 extremities w/o difficulty. Affect pleasant. Weakness present  Labs reviewed: Basic Metabolic Panel:  Recent Labs  40/98/11 0157 09/12/12 0456  01/03/13 0800  01/07/13 0615  01/11/13 0500 01/11/13 0810  03/28/13 0412 03/29/13 0435 03/30/13 0625  NA 143 139  < > 140  < > 140  < > 134*  --   < > 134* 133* 134*  K 2.8* 2.9*  < > 3.8  < > 3.5  < > 3.4*  --   < > 4.2 4.2 3.8  CL 101 100  < > 91*  < > 90*  < > 83*  --   < > 89* 88* 88*  CO2 33* 32  < > 40*  < > 44*  < > >45*  --   < > 36* 39* 40*  GLUCOSE 184* 128*  < > 154*  < > 126*  < > 171*  --   < > 157* 228* 124*  BUN 51* 41*  < > 43*  < > 47*  < >  52*  --   < > 64* 65* 62*  CREATININE 1.29* 1.06  < > 1.62*  < > 1.69*  < > 2.10*  --   < > 2.61* 2.77* 2.60*  CALCIUM 9.2 9.1  < > 10.0  < > 9.7  < > 9.9  --   < > 9.8 9.4 9.5  MG 2.2  --   --  2.0  --  1.8  --  2.0 1.9  --   --   --   --   PHOS 2.9 2.7  --  3.4  --   --   --   --   --   --   --   --   --   < > = values in this interval not displayed. Liver Function Tests:  Recent Labs  02/08/13 2139 03/10/13 2117 03/19/13 2123 03/23/13 1801  AST 22 19 18   --   ALT 16 11 10   --   ALKPHOS 150* 121* 109  --   BILITOT 0.3 0.5 0.6  --   PROT 7.7 7.7 7.7 7.8  ALBUMIN 4.2 4.1 4.1  --     Recent Labs  06/14/12 2018 03/19/13 2123  LIPASE 39 20   No results found for this basename: AMMONIA,  in  the last 8760 hours CBC:  Recent Labs  02/08/13 2139  02/18/13 2108  03/19/13 2123 03/22/13 0036 03/30/13 0625  WBC 6.0  < > 6.1  < > 5.9 5.0 4.1  NEUTROABS 4.6  --  4.7  --  4.5  --   --   HGB 10.0*  < > 9.5*  < > 9.6* 8.9* 8.9*  HCT 31.2*  < > 28.6*  < > 30.5* 27.3* 28.2*  MCV 85.0  < > 82.9  < > 86.6 86.9 87.9  PLT 129*  < > 110*  < > 104* 104* 113*  < > = values in this interval not displayed. Cardiac Enzymes:  Recent Labs  06/14/12 1948 06/15/12 1156  09/09/12 0034  03/21/13 1321 03/21/13 1840 03/22/13 0036  CKTOTAL 48 42  --  62  --   --   --   --   CKMB 1.3 1.2  --  1.9  --   --   --   --   TROPONINI <0.30 <0.30  < > <0.30  < > <0.30 <0.30 <0.30  < > = values in this interval not displayed.  04/02/13 Na 136, k 3.5, glu 128, bun 63, cr 2.84, ca 9.2  Assessment/Plan  CHF- has chronic CHF with frequent exacerbations and poor prognosis. her weight has been fluctuating recently between 2- 4 lbs. Followed by heart clinic.  Will need daily weight monitoring. Continue carvedilol, torsemide, metolazone. Dietary and fluid restriction reinforced. Monitor renal function  Copd- not routinely CPAP machine at bedtime. CPAP machine has been checked and working fine. Pt encouraged to use it, benefits explained. Continue bronchodilator treatment and monitor clinically  Insomnia- continue melatonin  Generalized weakness- continue working with PT/OT for muscle strengthening  Allergic rhinitis- use flonase 1 spray each nostril   DM2- monitor cbg for now, continue diet and lantus 5 u at bedtime for now with novolog SSI and montior and titrate further if needed  depression- will continue alprazolam and buspirone, monitor clinically  afib- rate controlled with lopressor, continue amiodarone and warfarin . Monitor inr, goal 2-3

## 2013-05-03 ENCOUNTER — Telehealth (HOSPITAL_COMMUNITY): Payer: Self-pay | Admitting: *Deleted

## 2013-05-03 ENCOUNTER — Emergency Department (HOSPITAL_COMMUNITY)
Admission: EM | Admit: 2013-05-03 | Discharge: 2013-05-04 | Disposition: A | Discharge status: Skilled Nursing Facility | Payer: Medicare Other | Attending: Emergency Medicine | Admitting: Emergency Medicine

## 2013-05-03 ENCOUNTER — Encounter (HOSPITAL_COMMUNITY): Payer: Self-pay

## 2013-05-03 DIAGNOSIS — J4489 Other specified chronic obstructive pulmonary disease: Secondary | ICD-10-CM | POA: Insufficient documentation

## 2013-05-03 DIAGNOSIS — N183 Chronic kidney disease, stage 3 unspecified: Secondary | ICD-10-CM | POA: Insufficient documentation

## 2013-05-03 DIAGNOSIS — Z8709 Personal history of other diseases of the respiratory system: Secondary | ICD-10-CM | POA: Insufficient documentation

## 2013-05-03 DIAGNOSIS — Z880 Allergy status to penicillin: Secondary | ICD-10-CM | POA: Insufficient documentation

## 2013-05-03 DIAGNOSIS — F411 Generalized anxiety disorder: Secondary | ICD-10-CM | POA: Insufficient documentation

## 2013-05-03 DIAGNOSIS — J449 Chronic obstructive pulmonary disease, unspecified: Secondary | ICD-10-CM | POA: Insufficient documentation

## 2013-05-03 DIAGNOSIS — R3915 Urgency of urination: Secondary | ICD-10-CM | POA: Insufficient documentation

## 2013-05-03 DIAGNOSIS — Z8781 Personal history of (healed) traumatic fracture: Secondary | ICD-10-CM | POA: Insufficient documentation

## 2013-05-03 DIAGNOSIS — K219 Gastro-esophageal reflux disease without esophagitis: Secondary | ICD-10-CM | POA: Insufficient documentation

## 2013-05-03 DIAGNOSIS — M545 Low back pain, unspecified: Secondary | ICD-10-CM | POA: Insufficient documentation

## 2013-05-03 DIAGNOSIS — Z862 Personal history of diseases of the blood and blood-forming organs and certain disorders involving the immune mechanism: Secondary | ICD-10-CM | POA: Insufficient documentation

## 2013-05-03 DIAGNOSIS — F3289 Other specified depressive episodes: Secondary | ICD-10-CM | POA: Insufficient documentation

## 2013-05-03 DIAGNOSIS — Z87440 Personal history of urinary (tract) infections: Secondary | ICD-10-CM | POA: Insufficient documentation

## 2013-05-03 DIAGNOSIS — Z8719 Personal history of other diseases of the digestive system: Secondary | ICD-10-CM | POA: Insufficient documentation

## 2013-05-03 DIAGNOSIS — I4891 Unspecified atrial fibrillation: Secondary | ICD-10-CM | POA: Insufficient documentation

## 2013-05-03 DIAGNOSIS — J9610 Chronic respiratory failure, unspecified whether with hypoxia or hypercapnia: Secondary | ICD-10-CM | POA: Insufficient documentation

## 2013-05-03 DIAGNOSIS — Z79899 Other long term (current) drug therapy: Secondary | ICD-10-CM | POA: Insufficient documentation

## 2013-05-03 DIAGNOSIS — I129 Hypertensive chronic kidney disease with stage 1 through stage 4 chronic kidney disease, or unspecified chronic kidney disease: Secondary | ICD-10-CM | POA: Insufficient documentation

## 2013-05-03 DIAGNOSIS — Z7901 Long term (current) use of anticoagulants: Secondary | ICD-10-CM | POA: Insufficient documentation

## 2013-05-03 DIAGNOSIS — G8929 Other chronic pain: Secondary | ICD-10-CM | POA: Insufficient documentation

## 2013-05-03 DIAGNOSIS — F329 Major depressive disorder, single episode, unspecified: Secondary | ICD-10-CM | POA: Insufficient documentation

## 2013-05-03 DIAGNOSIS — I5043 Acute on chronic combined systolic (congestive) and diastolic (congestive) heart failure: Secondary | ICD-10-CM

## 2013-05-03 DIAGNOSIS — Z794 Long term (current) use of insulin: Secondary | ICD-10-CM | POA: Insufficient documentation

## 2013-05-03 DIAGNOSIS — M109 Gout, unspecified: Secondary | ICD-10-CM | POA: Insufficient documentation

## 2013-05-03 DIAGNOSIS — I5042 Chronic combined systolic (congestive) and diastolic (congestive) heart failure: Secondary | ICD-10-CM | POA: Insufficient documentation

## 2013-05-03 DIAGNOSIS — G4733 Obstructive sleep apnea (adult) (pediatric): Secondary | ICD-10-CM | POA: Insufficient documentation

## 2013-05-03 DIAGNOSIS — Z8669 Personal history of other diseases of the nervous system and sense organs: Secondary | ICD-10-CM | POA: Insufficient documentation

## 2013-05-03 LAB — BASIC METABOLIC PANEL
BUN: 59 mg/dL — ABNORMAL HIGH (ref 6–23)
Calcium: 9.3 mg/dL (ref 8.4–10.5)
GFR calc non Af Amer: 16 mL/min — ABNORMAL LOW (ref >90)
Glucose, Bld: 239 mg/dL — ABNORMAL HIGH (ref 70–99)

## 2013-05-03 LAB — CBC WITH DIFFERENTIAL/PLATELET
Basophils Relative: 0 % (ref 0–1)
Eosinophils Absolute: 0.1 10*3/uL (ref 0.0–0.7)
HCT: 36.1 % (ref 36.0–46.0)
Hemoglobin: 11.7 g/dL — ABNORMAL LOW (ref 12.0–15.0)
MCH: 28.2 pg (ref 26.0–34.0)
MCHC: 32.4 g/dL (ref 30.0–36.0)
Monocytes Absolute: 0.4 10*3/uL (ref 0.1–1.0)
Monocytes Relative: 6 % (ref 3–12)

## 2013-05-03 MED ORDER — TORSEMIDE 20 MG PO TABS: 80.0000 mg | ORAL_TABLET | Freq: Two times a day (BID) | ORAL | Status: DC

## 2013-05-03 NOTE — ED Notes (Signed)
Pt c/o not liking the nurses where she stays because  They look at her funny and they are all on the same side.  They delayed her going to physical therapy this morning when she was supposed to go early.  Her medicines were melted in the apple sauce when the nurse gave her meds this evening.  She stated that she spoke with the charge nurse/administrator but said that they are all buddies and they don't care.

## 2013-05-03 NOTE — ED Provider Notes (Signed)
This chart was scribed for Gina Silk PA-C, a non-physician practitioner working with Gina Ashley. Gina Payor, MD by Lewanda Rife, ED Scribe. This patient was seen in room TR09C/TR09C and the patient's care was started at 2156.    History    CSN: [id number] Arrival date & time 05/03/13  2123  First MD Initiated Contact with Patient 05/03/13 2154     Chief Complaint  Patient presents with  . Anxiety   (Consider location/radiation/quality/duration/timing/severity/associated sxs/prior Treatment) HPI HPI Comments: Gina Ashley is a 79 y.o. female who presents to the Emergency Department complaining of incremental weight gain onset 2 days "196 to 202 pounds". Reports associated anxiety, and urinary urgency. "I feel dehydrated" decreased fluid intake because unsure if she should drink fluids due to CHF and CKD. Denies associated fever, change in diet, shortness of breath, and chest pain. Reports chronic CHF admission on 03/19/13. Reports she lives at [facility]. Reports hx of CKD.  Past Medical History  Diagnosis Date  . GERD (gastroesophageal reflux disease)   . Hiatal hernia   . HTN (hypertension)     all her life  . CKD (chronic kidney disease), stage III     GFR: 38  . Atrial fibrillation     since 1996  . Depression   . Chronic diastolic heart failure 08/31/2012  . Tibia fracture     BILATERAL  . Renal failure     acute on chronic   . Respiratory failure     acute on chronic hx of  requiring BIPAP  . COPD (chronic obstructive pulmonary disease)   . Metabolic encephalopathy     hx of   . Renal failure     acute on chronic with need for intermittent dialysis - hx of   . UTI (urinary tract infection)     hx of   . Pleural effusion     right sided now resolved   . CHF (congestive heart failure)     diastolic HF  . Complication of anesthesia     "hard to wake up" (03/11/2013)  . OSA on CPAP   . Type II diabetes mellitus 1980's?  . History of blood transfusion  08/2012    "related to leg OR" (03/11/2013)  . Arthritis     "left arm; lower back, hips, hands" (03/11/2013)  . Chronic lower back pain     "disc pops in and out" (03/11/2013)  . Gout   . Anxiety   . Fall 08/2012    "in nursing home" (03/11/2013)  . Anemia   . Chronic a-fib 03/12/2013   Past Surgical History  Procedure Laterality Date  . Bunionectomy Bilateral     bilateral  . Cholecystectomy    . Shoulder arthroscopy w/ rotator cuff repair Left 2002  . Total knee arthroplasty Bilateral 2001  . Hammer toe surgery Bilateral 2004  . Colonoscopy  07/11/2011    Procedure: COLONOSCOPY;  Surgeon: Malissa Hippo, MD;  [facility];  Service: Endoscopy;  Laterality: N/A;  3:00  . Orif periprosthetic fracture  09/08/2012    Procedure: OPEN REDUCTION INTERNAL FIXATION (ORIF) PERIPROSTHETIC FRACTURE;  Surgeon: Shelda Pal, MD;  Location: [facility];  Service: Orthopedics;  Laterality: Right;  ORIF periprosthetic right proximal femur fracture   . External fixation leg  09/08/2012    Procedure: EXTERNAL FIXATION LEG;  Surgeon: Shelda Pal, MD;  Location: [facility];  Service: Orthopedics;  Laterality: Left;  . Joint replacement    . External fixation leg  11/30/2012    Procedure: EXTERNAL FIXATION LEG;  Surgeon: Shelda Pal, MD;  Location: [facility];  Service: Orthopedics;  Laterality: Left;  Removal of External Fixation Left Knee with Evaluation with Floroscopy  . Tee without cardioversion N/A 01/19/2013    Procedure: TRANSESOPHAGEAL ECHOCARDIOGRAM (TEE);  Surgeon: Lewayne Bunting, MD;  Location: [facility];  Service: Cardiovascular;  Laterality: N/A;  . Shoulder open rotator cuff repair Left 2002    "maybe 3 months after scope" (03/11/2013)  . Femur fracture surgery Right 08/2012    "fell at nursing home" (03/11/2013)  . Tibia fracture surgery Left 08/2012    "fell at nursing home" (03/11/2013)   Family History  Problem Relation Age of Onset  . Colon cancer Brother    History  Substance Use  Topics  . Smoking status: Never Smoker   . Smokeless tobacco: Never Used  . Alcohol Use: No   OB History   Grav Para Term Preterm Abortions TAB SAB Ect Mult Living                 Review of Systems  Constitutional: Negative for fever.  Respiratory: Negative for shortness of breath.   Cardiovascular: Negative for chest pain.  Psychiatric/Behavioral: Negative for confusion. The patient is nervous/anxious.   All other systems reviewed and are negative.    Allergies  Morphine sulfate; Remeron; Tuna; and Penicillins  Home Medications   Current Outpatient Rx  Name  Route  Sig  Dispense  Refill  . acetaminophen (TYLENOL) 325 MG tablet   Oral   Take 650 mg by mouth every 6 (six) hours as needed for pain.         Marland Kitchen albuterol (PROVENTIL) (2.5 MG/3ML) 0.083% nebulizer solution   Nebulization   Take 3 mLs (2.5 mg total) by nebulization every 2 (two) hours as needed for wheezing or shortness of breath.   75 mL   12   . allopurinol (ZYLOPRIM) 100 MG tablet   Oral   Take 100 mg by mouth daily.         Marland Kitchen ALPRAZolam (XANAX) 0.25 MG tablet      Take one half tablet by mouth every 8 hours as needed for anxiety   45 tablet   5   . amiodarone (PACERONE) 200 MG tablet   Oral   Take 400 mg by mouth daily.         . busPIRone (BUSPAR) 5 MG tablet   Oral   Take 1 tablet (5 mg total) by mouth every 12 (twelve) hours.   30 tablet   0   . carvedilol (COREG) 25 MG tablet   Oral   Take 25 mg by mouth 2 (two) times daily with a meal.         . docusate sodium (COLACE) 100 MG capsule   Oral   Take 100 mg by mouth every 8 (eight) hours.          Marland Kitchen guaiFENesin (MUCINEX) 600 MG 12 hr tablet   Oral   Take 600 mg by mouth 2 (two) times daily.          Marland Kitchen HYDROcodone-acetaminophen (NORCO) 7.5-325 MG per tablet   Oral   Take 1 tablet by mouth at bedtime.         . insulin aspart (NOVOLOG) 100 UNIT/ML injection   Subcutaneous   Inject 5 Units into the skin 3 (three)  times daily with meals. If cbg is greater than 150         .  insulin glargine (LANTUS) 100 UNIT/ML injection   Subcutaneous   Inject 5 Units into the skin at bedtime.         . Melatonin 3 MG TABS   Oral   Take 1 tablet by mouth daily.         . Multiple Vitamin (MULTIVITAMIN WITH MINERALS) TABS   Oral   Take 1 tablet by mouth daily.         . nitroGLYCERIN (NITROSTAT) 0.4 MG SL tablet   Sublingual   Place 0.4 mg under the tongue every 5 (five) minutes as needed for chest pain.         Marland Kitchen omeprazole (PRILOSEC) 20 MG capsule   Oral   Take 40 mg by mouth daily.         . ondansetron (ZOFRAN) 4 MG tablet   Oral   Take 1 tablet (4 mg total) by mouth every 6 (six) hours as needed for nausea.   20 tablet      . polyethylene glycol (MIRALAX / GLYCOLAX) packet   Oral   Take 17 g by mouth 2 (two) times daily.          . simethicone (MYLICON) 80 MG chewable tablet   Oral   Chew 80 mg by mouth every 6 (six) hours as needed for flatulence.         . torsemide (DEMADEX) 20 MG tablet   Oral   Take 4 tablets (80 mg total) by mouth 2 (two) times daily.         . traZODone (DESYREL) 50 MG tablet   Oral   Take 50 mg by mouth at bedtime.         Marland Kitchen warfarin (COUMADIN) 3 MG tablet   Oral   Take 1 mg by mouth daily. 2.5 mg and 3 mg alternating day dose          BP 154/81  Pulse 78  Temp(Src) 97.6 F (36.4 C) (Oral)  Resp 20  SpO2 100% Physical Exam  Nursing note and vitals reviewed. Constitutional: She is oriented to person, place, and time. She appears well-developed and well-nourished. No distress.  HENT:  Head: Normocephalic and atraumatic.  Eyes: EOM are normal.  Neck: Neck supple. No tracheal deviation present.  Cardiovascular: Normal rate.   Pulmonary/Chest: Effort normal. No respiratory distress.  Musculoskeletal: Normal range of motion.  Neurological: She is alert and oriented to person, place, and time.  Skin: Skin is warm and dry.   Psychiatric: She has a normal mood and affect. Her behavior is normal.    ED Course  Procedures (including critical care time) Medications - No data to display  Labs Reviewed  CBC WITH DIFFERENTIAL - Abnormal; Notable for the following:    Hemoglobin 11.7 (*)    RDW 16.0 (*)    All other components within normal limits  PRO B NATRIURETIC PEPTIDE - Abnormal; Notable for the following:    Pro B Natriuretic peptide (BNP) 4188.0 (*)    All other components within normal limits  BASIC METABOLIC PANEL - Abnormal; Notable for the following:    Chloride 93 (*)    CO2 33 (*)    Glucose, Bld 239 (*)    BUN 59 (*)    Creatinine, Ser 2.53 (*)    GFR calc non Af Amer 16 (*)    GFR calc Af Amer 19 (*)    All other components within normal limits  URINALYSIS, ROUTINE W REFLEX MICROSCOPIC - Abnormal; Notable  for the following:    Hgb urine dipstick SMALL (*)    Leukocytes, UA LARGE (*)    All other components within normal limits  URINE MICROSCOPIC-ADD ON - Abnormal; Notable for the following:    Bacteria, UA MANY (*)    All other components within normal limits  URINE CULTURE   Dg Chest 2 View  05/04/2013   *RADIOLOGY REPORT*  Clinical Data: Short of breath.  Anxiety.  CHEST - 2 VIEW  Comparison: 03/23/2013.  Findings: Cardiomegaly.  Moderate right pleural effusion.  Right greater than left perihilar airspace disease.  Findings favor asymmetric/atypical pulmonary edema and CHF.  Interstitial pulmonary edema is present on the left.  Mediastinal contours are within normal limits allowing for lower volumes of inspiration. Aortic arch atherosclerosis.  IMPRESSION: Cardiomegaly with right greater than left pulmonary edema and small to moderate right pleural effusion.  Findings are likely secondary to CHF.   Original Report Authenticated By: Andreas Newport, M.D.   1. Congestive heart failure (CHF), acute on chronic, combined     MDM  Patient was medically screened and basic labs were ordered. Hx  of CKD and CHF - complaints of fluid retention. I do not believe this patient is fast track and due to the volume of patients, I will not be able to complete her workup. Vital signs stable for transfer to a different pod.    I personally performed the services described in this documentation, which was scribed in my presence. The recorded information has been reviewed and is accurate.     Mora Bellman, PA-C 05/04/13 [phone]

## 2013-05-03 NOTE — ED Notes (Signed)
Pt here with anxiety.  Sts she feels tired and dehydrated.  Family requested that pt be brought to ER.  Pt a&o x 4, VSS (128/80, 64, 18).  Pt denying chest pain, shortness of breath.

## 2013-05-03 NOTE — Telephone Encounter (Signed)
Received weekly labs for pt from [facility], last week pt's wt ranged from 192-196, however on 6/27 it was up to 199, 6/28 200 and today 6/30 200, they state that their MD changed pt's Torsemide for 60 BID to 80 BID, will continue to monitor

## 2013-05-04 ENCOUNTER — Non-Acute Institutional Stay (SKILLED_NURSING_FACILITY): Payer: Medicare Other | Admitting: Internal Medicine

## 2013-05-04 ENCOUNTER — Emergency Department (HOSPITAL_COMMUNITY): Payer: Medicare Other

## 2013-05-04 DIAGNOSIS — I4891 Unspecified atrial fibrillation: Secondary | ICD-10-CM

## 2013-05-04 DIAGNOSIS — I5033 Acute on chronic diastolic (congestive) heart failure: Secondary | ICD-10-CM

## 2013-05-04 DIAGNOSIS — I482 Chronic atrial fibrillation, unspecified: Secondary | ICD-10-CM

## 2013-05-04 DIAGNOSIS — N184 Chronic kidney disease, stage 4 (severe): Secondary | ICD-10-CM

## 2013-05-04 LAB — URINE MICROSCOPIC-ADD ON

## 2013-05-04 LAB — URINALYSIS, ROUTINE W REFLEX MICROSCOPIC
Bilirubin Urine: NEGATIVE
Protein, ur: NEGATIVE mg/dL
Urobilinogen, UA: 0.2 mg/dL (ref 0.0–1.0)

## 2013-05-04 LAB — POCT I-STAT TROPONIN I: Troponin i, poc: 0.02 ng/mL (ref 0.00–0.08)

## 2013-05-04 MED ORDER — TORSEMIDE 20 MG PO TABS: 80.0000 mg | ORAL_TABLET | Freq: Two times a day (BID) | ORAL | Status: DC

## 2013-05-04 MED ORDER — ACETAMINOPHEN 325 MG PO TABS
650.0000 mg | ORAL_TABLET | ORAL | Status: AC
  Administered 2013-05-04: 650 mg via ORAL
  Filled 2013-05-04: qty 2

## 2013-05-04 MED ORDER — TORSEMIDE 20 MG PO TABS
80.0000 mg | ORAL_TABLET | Freq: Every day | ORAL | Status: DC
  Administered 2013-05-04: 80 mg via ORAL
  Filled 2013-05-04: qty 4

## 2013-05-04 NOTE — Progress Notes (Signed)
Patient ID: Gina Ashley, female   DOB: 31-Mar-1927, 79 y.o.   MRN: [id number] Location:  [facility] Provider:  Elmarie Shiley L. Renato Gails, D.O., C.M.D.  Code Status:  Full code  Chief Complaint:  Hospitalization f/u CHF  HPI: 79 yo female with h/o chronic diastolic CHF had just been seen by my colleague due to noted weight gain.  Apparently her family insisted she be sent out to the hospital for evaluation.  In the ED, her demadex was increased to 80mg  po bid which had actually already been ordered here (for just a 3 day course).  Per nursing supervisors, she had been drinking increased fluids aside from what is on her tray and eating pizza brought by her family.    Majority of glucose readings, especially for past week, have been over 200 (in 200s range).    Weights reviewed today. Her weight today remains 200.    Upon review of chart, new orders have been entered into epic from yesterday (6/30) by the CHF clinic, but this new order has not been put into place here:  demadex 20mg  po tid and acetazolamide 250mg  daily x 3 days.  Thayer Ohm, Leisure centre manager, spoke with Tonye Becket, NP, at CHF clinic.  We informed them of her dietary noncompliance.  They would like her to stay  below 217# per prior notes.  She is to f/u tomorrow in the CHF clinic.  We are to continue the demadex 80mg  po bid not demadex 20mg  po tid or acetazolamide.      Review of Systems:  Review of Systems  Constitutional: Negative for fever.  Eyes: Negative for blurred vision.  Respiratory: Negative for cough and shortness of breath.   Cardiovascular: Positive for leg swelling. Negative for chest pain, palpitations, orthopnea and PND.  Gastrointestinal: Negative for abdominal pain.  Genitourinary: Positive for frequency. Negative for dysuria.  Musculoskeletal: Negative for myalgias.  Neurological: Positive for weakness. Negative for sensory change and headaches.  Endo/Heme/Allergies: Does not bruise/bleed easily.      Medications: Patient's Medications  New Prescriptions   No medications on file  Previous Medications   ACETAMINOPHEN (TYLENOL) 325 MG TABLET    Take 650 mg by mouth every 6 (six) hours as needed for pain.   ACETAZOLAMIDE (DIAMOX) 250 MG TABLET    Take 250 mg by mouth daily. Take 1 tablet by mouth for 3 days. Unknown start date   ALBUTEROL (PROVENTIL) (2.5 MG/3ML) 0.083% NEBULIZER SOLUTION    Take 3 mLs (2.5 mg total) by nebulization every 2 (two) hours as needed for wheezing or shortness of breath.   ALLOPURINOL (ZYLOPRIM) 100 MG TABLET    Take 100 mg by mouth daily.   ALPRAZOLAM (XANAX) 0.25 MG TABLET    Take one half tablet by mouth every 8 hours as needed for anxiety   AMIODARONE (PACERONE) 200 MG TABLET    Take 400 mg by mouth daily.   BUSPIRONE (BUSPAR) 5 MG TABLET    Take 1 tablet (5 mg total) by mouth every 12 (twelve) hours.   CARVEDILOL (COREG) 25 MG TABLET    Take 25 mg by mouth 2 (two) times daily with a meal.   DOCUSATE SODIUM (COLACE) 100 MG CAPSULE    Take 100 mg by mouth every 8 (eight) hours.    GUAIFENESIN (MUCINEX) 600 MG 12 HR TABLET    Take 600 mg by mouth 2 (two) times daily.    INSULIN ASPART (NOVOLOG) 100 UNIT/ML INJECTION    Inject 5 Units  into the skin 3 (three) times daily with meals. If cbg is greater than 150   INSULIN GLARGINE (LANTUS) 100 UNIT/ML INJECTION    Inject 5 Units into the skin at bedtime.   MULTIPLE VITAMIN (MULTIVITAMIN WITH MINERALS) TABS    Take 1 tablet by mouth daily.   NITROGLYCERIN (NITROSTAT) 0.4 MG SL TABLET    Place 0.4 mg under the tongue every 5 (five) minutes as needed for chest pain.   OMEPRAZOLE (PRILOSEC) 20 MG CAPSULE    Take 40 mg by mouth daily.   ONDANSETRON (ZOFRAN) 4 MG TABLET    Take 1 tablet (4 mg total) by mouth every 6 (six) hours as needed for nausea.   OXYCODONE (OXY IR/ROXICODONE) 5 MG IMMEDIATE RELEASE TABLET    Take 5 mg by mouth every 4 (four) hours as needed for pain.   OXYCODONE (OXYCONTIN) 10 MG 12 HR TABLET     Take 10 mg by mouth every 12 (twelve) hours. Patient also take oxycodone 5mg  IR. Per MAR   POLYETHYLENE GLYCOL (MIRALAX / GLYCOLAX) PACKET    Take 17 g by mouth 2 (two) times daily.    SACCHAROMYCES BOULARDII (FLORASTOR) 250 MG CAPSULE    Take 250 mg by mouth 2 (two) times daily.   SIMETHICONE (MYLICON) 80 MG CHEWABLE TABLET    Chew 80 mg by mouth every 6 (six) hours as needed for flatulence.   TORSEMIDE (DEMADEX) 20 MG TABLET    Take 20 mg by mouth 3 (three) times daily.   TRAZODONE (DESYREL) 50 MG TABLET    Take 50 mg by mouth at bedtime.   WARFARIN (COUMADIN) 3 MG TABLET    Take 3 mg by mouth daily.   Modified Medications   No medications on file  Discontinued Medications   No medications on file    Physical Exam: There were no vitals filed for this visit. Physical Exam  Constitutional: No distress.  Obese female  HENT:  Head: Normocephalic and atraumatic.  Neck: Neck supple. JVD present. No tracheal deviation present. No thyromegaly present.  Cardiovascular: Normal rate, regular rhythm, normal heart sounds and intact distal pulses.   2+ pitting edema bilaterally  Abdominal: Soft. Bowel sounds are normal. She exhibits no distension. There is no tenderness.  Musculoskeletal: Normal range of motion.  Neurological: She is alert.  Skin: Skin is warm and dry.  Psychiatric: She has a normal mood and affect.     Labs reviewed: Basic Metabolic Panel:  Recent Labs  78/29/56 0157 09/12/12 0456  01/03/13 0800  01/07/13 0615  01/11/13 0500 01/11/13 0810  03/29/13 0435 03/30/13 0625 05/03/13 2223  NA 143 139  < > 140  < > 140  < > 134*  --   < > 133* 134* 135  K 2.8* 2.9*  < > 3.8  < > 3.5  < > 3.4*  --   < > 4.2 3.8 3.9  CL 101 100  < > 91*  < > 90*  < > 83*  --   < > 88* 88* 93*  CO2 33* 32  < > 40*  < > 44*  < > >45*  --   < > 39* 40* 33*  GLUCOSE 184* 128*  < > 154*  < > 126*  < > 171*  --   < > 228* 124* 239*  BUN 51* 41*  < > 43*  < > 47*  < > 52*  --   < > 65* 62* 59*  CREATININE 1.29* 1.06  < > 1.62*  < > 1.69*  < > 2.10*  --   < > 2.77* 2.60* 2.53*  CALCIUM 9.2 9.1  < > 10.0  < > 9.7  < > 9.9  --   < > 9.4 9.5 9.3  MG 2.2  --   --  2.0  --  1.8  --  2.0 1.9  --   --   --   --   PHOS 2.9 2.7  --  3.4  --   --   --   --   --   --   --   --   --   < > = values in this interval not displayed.  Liver Function Tests:  Recent Labs  02/08/13 2139 03/10/13 2117 03/19/13 2123 03/23/13 1801  AST 22 19 18   --   ALT 16 11 10   --   ALKPHOS 150* 121* 109  --   BILITOT 0.3 0.5 0.6  --   PROT 7.7 7.7 7.7 7.8  ALBUMIN 4.2 4.1 4.1  --     CBC:  Recent Labs  02/18/13 2108  03/19/13 2123 03/22/13 0036 03/30/13 0625 05/03/13 2223  WBC 6.1  < > 5.9 5.0 4.1 5.7  NEUTROABS 4.7  --  4.5  --   --  4.2  HGB 9.5*  < > 9.6* 8.9* 8.9* 11.7*  HCT 28.6*  < > 30.5* 27.3* 28.2* 36.1  MCV 82.9  < > 86.6 86.9 87.9 87.0  PLT 110*  < > 104* 104* 113* 157  < > = values in this interval not displayed.   Assessment/Plan 1. Acute on chronic diastolic heart failure -pt will remain on demadex 80mg  po bid per chf clinic, no acetazolamide -keep appt tomorrow with CHF clinic -cont daily weights -adherence with sodium and fluid restriction encouraged, staff have been trying to educate here family, as well, when they bring her items that are not on the diet  2. Chronic a-fib -cont coumadin with INR goal 2-3, amiodarone, and coreg -rate is controlled at present  3. CKD (chronic kidney disease), stage 4 (severe) -cont to monitor bmp due to diuretic adjustments

## 2013-05-04 NOTE — ED Provider Notes (Signed)
Signs of generalized weakness for approximately 4 days. Patient states she's been gaining approximately 1 pound of per day for the past week. She also complains of feeling mildly depressed but denies feeling suicidal. She denies shortness of breath. Denies chest pain. Denies other complaint. On exam alert nontoxic lungs clear auscultation heart regular in rhythm abdomen obese nontender extremities without edema. Patient felt to be mildly fluid overloaded. Spoke with Dr. Tresa Endo. Will increase torsemide to 80 mg twice daily for 3 days. First dose given now. Patient instructed to followup with Dr.Bensihmon  this week.  Date: 05/04/2013  Rate: 70  Rhythm: Atrial flutter with variable block  QRS Axis: left  Intervals: normal  ST/T Wave abnormalities: nonspecific T wave changes  Conduction Disutrbances:nonspecific intraventricular conduction delay  Narrative Interpretation:   Old EKG Reviewed: No significant change from 03/29/2013 interpreted by me Results for orders placed during the hospital encounter of 05/03/13  CBC WITH DIFFERENTIAL      Result Value Range   WBC 5.7  4.0 - 10.5 K/uL   RBC 4.15  3.87 - 5.11 MIL/uL   Hemoglobin 11.7 (*) 12.0 - 15.0 g/dL   HCT 16.1  09.6 - 04.5 %   MCV 87.0  78.0 - 100.0 fL   MCH 28.2  26.0 - 34.0 pg   MCHC 32.4  30.0 - 36.0 g/dL   RDW 40.9 (*) 81.1 - 91.4 %   Platelets 157  150 - 400 K/uL   Neutrophils Relative % 75  43 - 77 %   Neutro Abs 4.2  1.7 - 7.7 K/uL   Lymphocytes Relative 17  12 - 46 %   Lymphs Abs 0.9  0.7 - 4.0 K/uL   Monocytes Relative 6  3 - 12 %   Monocytes Absolute 0.4  0.1 - 1.0 K/uL   Eosinophils Relative 2  0 - 5 %   Eosinophils Absolute 0.1  0.0 - 0.7 K/uL   Basophils Relative 0  0 - 1 %   Basophils Absolute 0.0  0.0 - 0.1 K/uL  PRO B NATRIURETIC PEPTIDE      Result Value Range   Pro B Natriuretic peptide (BNP) 4188.0 (*) 0 - 450 pg/mL  BASIC METABOLIC PANEL      Result Value Range   Sodium 135  135 - 145 mEq/L   Potassium 3.9   3.5 - 5.1 mEq/L   Chloride 93 (*) 96 - 112 mEq/L   CO2 33 (*) 19 - 32 mEq/L   Glucose, Bld 239 (*) 70 - 99 mg/dL   BUN 59 (*) 6 - 23 mg/dL   Creatinine, Ser 7.82 (*) 0.50 - 1.10 mg/dL   Calcium 9.3  8.4 - 95.6 mg/dL   GFR calc non Af Amer 16 (*) >90 mL/min   GFR calc Af Amer 19 (*) >90 mL/min  URINALYSIS, ROUTINE W REFLEX MICROSCOPIC      Result Value Range   Color, Urine YELLOW  YELLOW   APPearance CLEAR  CLEAR   Specific Gravity, Urine 1.011  1.005 - 1.030   pH 7.0  5.0 - 8.0   Glucose, UA NEGATIVE  NEGATIVE mg/dL   Hgb urine dipstick SMALL (*) NEGATIVE   Bilirubin Urine NEGATIVE  NEGATIVE   Ketones, ur NEGATIVE  NEGATIVE mg/dL   Protein, ur NEGATIVE  NEGATIVE mg/dL   Urobilinogen, UA 0.2  0.0 - 1.0 mg/dL   Nitrite NEGATIVE  NEGATIVE   Leukocytes, UA LARGE (*) NEGATIVE  URINE MICROSCOPIC-ADD ON  Result Value Range   Squamous Epithelial / LPF RARE  RARE   WBC, UA 3-6  <3 WBC/hpf   RBC / HPF 0-2  <3 RBC/hpf   Bacteria, UA MANY (*) RARE   Urine-Other WBC CLUMPS    POCT I-STAT TROPONIN I      Result Value Range   Troponin i, poc 0.02  0.00 - 0.08 ng/mL   Comment 3            Dg Chest 2 View  05/04/2013   *RADIOLOGY REPORT*  Clinical Data: Short of breath.  Anxiety.  CHEST - 2 VIEW  Comparison: 03/23/2013.  Findings: Cardiomegaly.  Moderate right pleural effusion.  Right greater than left perihilar airspace disease.  Findings favor asymmetric/atypical pulmonary edema and CHF.  Interstitial pulmonary edema is present on the left.  Mediastinal contours are within normal limits allowing for lower volumes of inspiration. Aortic arch atherosclerosis.  IMPRESSION: Cardiomegaly with right greater than left pulmonary edema and small to moderate right pleural effusion.  Findings are likely secondary to CHF.   Original Report Authenticated By: Andreas Newport, M.D.   Dx #1 CHF #2 chronic renal insufficiency #3 hyperglycemia  Doug Sou, MD 05/04/13 [phone]

## 2013-05-05 ENCOUNTER — Ambulatory Visit (HOSPITAL_COMMUNITY)
Admission: RE | Admit: 2013-05-05 | Discharge: 2013-05-05 | Disposition: A | Discharge status: Home / Self Care | Payer: Medicare Other | Source: Ambulatory Visit | Attending: Internal Medicine | Admitting: Internal Medicine

## 2013-05-05 VITALS — BP 122/64 | HR 63 | Wt 198.0 lb

## 2013-05-05 DIAGNOSIS — I5032 Chronic diastolic (congestive) heart failure: Secondary | ICD-10-CM

## 2013-05-05 DIAGNOSIS — I4891 Unspecified atrial fibrillation: Secondary | ICD-10-CM

## 2013-05-05 DIAGNOSIS — M545 Low back pain, unspecified: Secondary | ICD-10-CM | POA: Insufficient documentation

## 2013-05-05 DIAGNOSIS — E119 Type 2 diabetes mellitus without complications: Secondary | ICD-10-CM | POA: Insufficient documentation

## 2013-05-05 DIAGNOSIS — G4733 Obstructive sleep apnea (adult) (pediatric): Secondary | ICD-10-CM | POA: Insufficient documentation

## 2013-05-05 DIAGNOSIS — K219 Gastro-esophageal reflux disease without esophagitis: Secondary | ICD-10-CM | POA: Insufficient documentation

## 2013-05-05 DIAGNOSIS — Z7901 Long term (current) use of anticoagulants: Secondary | ICD-10-CM | POA: Insufficient documentation

## 2013-05-05 DIAGNOSIS — J9610 Chronic respiratory failure, unspecified whether with hypoxia or hypercapnia: Secondary | ICD-10-CM | POA: Insufficient documentation

## 2013-05-05 DIAGNOSIS — M109 Gout, unspecified: Secondary | ICD-10-CM | POA: Insufficient documentation

## 2013-05-05 DIAGNOSIS — J4489 Other specified chronic obstructive pulmonary disease: Secondary | ICD-10-CM | POA: Insufficient documentation

## 2013-05-05 DIAGNOSIS — I482 Chronic atrial fibrillation, unspecified: Secondary | ICD-10-CM

## 2013-05-05 DIAGNOSIS — I129 Hypertensive chronic kidney disease with stage 1 through stage 4 chronic kidney disease, or unspecified chronic kidney disease: Secondary | ICD-10-CM | POA: Insufficient documentation

## 2013-05-05 DIAGNOSIS — Z794 Long term (current) use of insulin: Secondary | ICD-10-CM | POA: Insufficient documentation

## 2013-05-05 DIAGNOSIS — N183 Chronic kidney disease, stage 3 unspecified: Secondary | ICD-10-CM | POA: Insufficient documentation

## 2013-05-05 DIAGNOSIS — J449 Chronic obstructive pulmonary disease, unspecified: Secondary | ICD-10-CM | POA: Insufficient documentation

## 2013-05-05 DIAGNOSIS — I509 Heart failure, unspecified: Secondary | ICD-10-CM | POA: Insufficient documentation

## 2013-05-05 DIAGNOSIS — K449 Diaphragmatic hernia without obstruction or gangrene: Secondary | ICD-10-CM | POA: Insufficient documentation

## 2013-05-05 DIAGNOSIS — M129 Arthropathy, unspecified: Secondary | ICD-10-CM | POA: Insufficient documentation

## 2013-05-05 DIAGNOSIS — Z79899 Other long term (current) drug therapy: Secondary | ICD-10-CM | POA: Insufficient documentation

## 2013-05-05 MED ORDER — METOLAZONE 2.5 MG PO TABS: 2.5000 mg | ORAL_TABLET | ORAL | Status: DC | PRN

## 2013-05-06 ENCOUNTER — Non-Acute Institutional Stay (SKILLED_NURSING_FACILITY): Payer: Medicare Other | Admitting: Internal Medicine

## 2013-05-06 DIAGNOSIS — I482 Chronic atrial fibrillation, unspecified: Secondary | ICD-10-CM

## 2013-05-06 DIAGNOSIS — I4891 Unspecified atrial fibrillation: Secondary | ICD-10-CM

## 2013-05-06 DIAGNOSIS — Z7901 Long term (current) use of anticoagulants: Secondary | ICD-10-CM

## 2013-05-06 NOTE — Progress Notes (Signed)
Patient ID: Gina Ashley, female   DOB: [birth year], 79 y.o.   MRN: [id number]   Weight Range  217 pounds  Baseline proBNP     HPI: Gina Ashley is an 79 y.o. female with history of chronic atrial fibrillation on coumadin, diastolic heart failure and HTN. She also has DM type 2, COPD, OSA on CPAP, A Fib- chronic coumadin, and CKD failure with baseline Cr 2.2. She has had 5 hospitalizations in the last 6 months, 1 included tibia/fibula fracture, 1 for UTI, and the rest have been due to massive fluid overload in the setting of diastolic heart failure. Per nephrology she is not a candidate for HD.   Admitted to [facility]  03/20/13 with increased dyspnea and lower extremity edema. Diuresed with IV lasix and transitioned to Demadex 60 mg bid. She received R thoracentesis.  Discharge weight 217 pounds. Discharged to [facility] 03/30/13. Palliative Care to follow at SNF.   She returns for follow up with her daughters.  Denies PND/Orthopnea. Occasionally dizzy. Weight at SNF trending down from 213 to 194 pounds. At the SNF when her weight went to 194 pounds she then regained to 201 in 24 hours. At the SNF demadex was increased to 80 mg twice a day for 3 days. She continues to use CPAP. Remains on 2 liters [place]. Working with PT/OT. Walking with PT. Now she is able to perform her own bath. Says she is getting salt from the diet at the SN such as pizza, hot dogs, and soups. Family bringing in fruit and occasionally pizza.  Limiting fluid intake to < 1.5 liters per day.   05/03/13 Creatinine 2.5 Potassium 3.9      ROS: All systems negative except as listed in HPI, PMH and Problem List.  Past Medical History  Diagnosis Date  . GERD (gastroesophageal reflux disease)   . Hiatal hernia   . HTN (hypertension)     all her life  . CKD (chronic kidney disease), stage III     GFR: 38  . Atrial fibrillation     since 1996  . Depression   . Chronic diastolic heart failure 08/31/2012  . Tibia fracture    BILATERAL  . Renal failure     acute on chronic   . Respiratory failure     acute on chronic hx of  requiring BIPAP  . COPD (chronic obstructive pulmonary disease)   . Metabolic encephalopathy     hx of   . Renal failure     acute on chronic with need for intermittent dialysis - hx of   . UTI (urinary tract infection)     hx of   . Pleural effusion     right sided now resolved   . CHF (congestive heart failure)     diastolic HF  . Complication of anesthesia     "hard to wake up" (03/11/2013)  . OSA on CPAP   . Type II diabetes mellitus 1980's?  . History of blood transfusion 08/2012    "related to leg OR" (03/11/2013)  . Arthritis     "left arm; lower back, hips, hands" (03/11/2013)  . Chronic lower back pain     "disc pops in and out" (03/11/2013)  . Gout   . Anxiety   . Fall 08/2012    "in nursing home" (03/11/2013)  . Anemia   . Chronic a-fib 03/12/2013    Current Outpatient Prescriptions  Medication Sig Dispense Refill  . acetaminophen (TYLENOL) 325 MG tablet Take  650 mg by mouth every 6 (six) hours as needed for pain.      Marland Kitchen albuterol (PROVENTIL) (2.5 MG/3ML) 0.083% nebulizer solution Take 3 mLs (2.5 mg total) by nebulization every 2 (two) hours as needed for wheezing or shortness of breath.  75 mL  12  . allopurinol (ZYLOPRIM) 100 MG tablet Take 100 mg by mouth daily.      Marland Kitchen ALPRAZolam (XANAX) 0.25 MG tablet Take one half tablet by mouth every 8 hours as needed for anxiety  45 tablet  5  . amiodarone (PACERONE) 200 MG tablet Take 400 mg by mouth daily.      . busPIRone (BUSPAR) 5 MG tablet Take 1 tablet (5 mg total) by mouth every 12 (twelve) hours.  30 tablet  0  . carvedilol (COREG) 25 MG tablet Take 25 mg by mouth 2 (two) times daily with a meal.      . docusate sodium (COLACE) 100 MG capsule Take 100 mg by mouth every 8 (eight) hours.       . fluticasone (FLONASE) 50 MCG/ACT nasal spray Place 1 spray into the nose daily.      Marland Kitchen guaiFENesin (MUCINEX) 600 MG 12 hr tablet Take  600 mg by mouth 2 (two) times daily.       Marland Kitchen HYDROcodone-acetaminophen (NORCO) 7.5-325 MG per tablet Take 1 tablet by mouth at bedtime as needed for pain.      Marland Kitchen insulin aspart (NOVOLOG) 100 UNIT/ML injection Inject 5 Units into the skin 3 (three) times daily with meals. If cbg is greater than 150      . insulin glargine (LANTUS) 100 UNIT/ML injection Inject 5 Units into the skin at bedtime.      . Melatonin 3 MG CAPS Take 1 capsule by mouth at bedtime.      . Multiple Vitamin (MULTIVITAMIN WITH MINERALS) TABS Take 1 tablet by mouth daily.      . nitroGLYCERIN (NITROSTAT) 0.4 MG SL tablet Place 0.4 mg under the tongue every 5 (five) minutes as needed for chest pain.      Marland Kitchen omeprazole (PRILOSEC) 20 MG capsule Take 40 mg by mouth daily.      . ondansetron (ZOFRAN) 4 MG tablet Take 1 tablet (4 mg total) by mouth every 6 (six) hours as needed for nausea.  20 tablet    . polyethylene glycol (MIRALAX / GLYCOLAX) packet Take 17 g by mouth 2 (two) times daily.       . simethicone (MYLICON) 80 MG chewable tablet Chew 80 mg by mouth every 6 (six) hours as needed for flatulence.      . torsemide (DEMADEX) 20 MG tablet Take 4 tablets (80 mg total) by mouth 2 (two) times daily.  60 tablet  0  . traZODone (DESYREL) 50 MG tablet Take 50 mg by mouth at bedtime.      Marland Kitchen warfarin (COUMADIN) 3 MG tablet Take 3 mg by mouth daily.       . metolazone (ZAROXOLYN) 2.5 MG tablet Take 1 tablet (2.5 mg total) by mouth as needed.  10 tablet  6   No current facility-administered medications for this encounter.     PHYSICAL EXAM: Filed Vitals:   05/05/13 1333  BP: 122/64  Pulse: 63  Weight: 198 lb (89.812 kg)  SpO2: 99%    General:  Chronically ill. Elderly sitting in wheel chair with her daughters HEENT: normal Neck: supple. JVP 6-7. Carotids 2+ bilaterally; no bruits. No lymphadenopathy or thryomegaly appreciated. Cor: PMI normal.  Regular rate & rhythm. No rubs, gallops or murmurs. Lungs: clear Abdomen: obese,  soft, nontender, nondistended. No hepatosplenomegaly. No bruits or masses. Good bowel sounds. Extremities: no cyanosis, clubbing, rash,  R and LLE ted hose. LLE brace edema Neuro: alert & orientedx3, cranial nerves grossly intact. Moves all 4 extremities w/o difficulty. Affect pleasant.    ASSESSMENT & PLAN:

## 2013-05-06 NOTE — Progress Notes (Signed)
Patient ID: Gina Ashley, female   DOB: 11/02/1927, 79 y.o.   MRN: [id number]  AV- coumadin management   hpi-  79 y.o. female resident with history of chronic CHF, COPD, DM2, HTN, CKD here for rehabilitation is on coumadin for her afib. inr today 2.08. No reports of bleeding or bruising.   Exam- vss  General: Elderly chronically ill appearing.  HEENT: normocephalic, MMM, perrla  Neck: supple. JVD +. Carotids 2+ bilaterally; no bruits. No lymphadenopathy or thryomegaly  CVS: PMI nondisplaced. Regular rate, irregular rhythm. No rubs, gallops or murmurs.  Lungs: Decreased air entry at the bases  Abdomen: soft, nontender, mild distention. No hepatosplenomegaly. No bruits or masses. Good bowel sounds.  Extremities: no cyanosis, clubbing, rash, trace edema  Neuro: alert & orientedx3, cranial nerves grossly intact. moves all 4 extremities w/o difficulty.   Lab reviewed  05/05/13 inr 2.08 PT 22.7  Assessment/plan-  afib- rate controlled with b blocker. On coumadin with inr 2.08 today. Is currently on 2.5 mg alternating with 3 mg. Continue 2.5 mg po daily for now and check inr in 1 week. Goal inr 2-3   Long term anticoagulation- monitor inr. Goal 2-3.

## 2013-05-06 NOTE — Assessment & Plan Note (Signed)
She also has component of right heart failure. She is much improved. Weight down considerably. Reinforced need for daily weights, dietary compliance and reviewed use of sliding scale diuretics.

## 2013-05-06 NOTE — Patient Instructions (Signed)
Continue current regimen. F/u 1 month.

## 2013-05-06 NOTE — Assessment & Plan Note (Signed)
Chronic. Rate controlled on amio. Continue coumadin. Consider cutting amio back at next visit.

## 2013-05-06 NOTE — ED Provider Notes (Signed)
Medical screening examination/treatment/procedure(s) were performed by non-physician practitioner and as supervising physician I was immediately available for consultation/collaboration.  Abree Romick R. Ardith Lewman, MD 05/06/13 1057 

## 2013-05-07 ENCOUNTER — Telehealth (HOSPITAL_COMMUNITY): Payer: Self-pay | Admitting: Emergency Medicine

## 2013-05-08 NOTE — Progress Notes (Signed)
ED Antimicrobial Stewardship Positive Culture Follow Up   Gina Ashley is an 79 y.o. female who presented to [facility] on 05/03/2013 with a chief complaint of CHF exacerbation. Chief Complaint  Patient presents with  . Anxiety    Recent Results (from the past 720 hour(s))  URINE CULTURE     Status: None   Collection Time    05/03/13 11:43 PM      Result Value Range Status   Specimen Description URINE, CATHETERIZED   Final   Special Requests NONE ADDED AT 0022   Final   Culture  Setup Time 05/04/2013 00:43   Final   Colony Count >=100,000 COLONIES/ML   Final   Culture     Final   Value: KLEBSIELLA PNEUMONIAE     Note: COLISTIN=0.19 UG/ML THIS ISOLATE HAS BEEN CONFIRMED AS A KPC CARBAPENEMASE PRODUCER ETEST results for this drug are "FOR INVESTIGATIONAL USE ONLY" and should NOT be used for clinical purposes.   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  She did not complain of urinary symptoms at this ED visit.  In the past, our ID physicians have recommended not treating this organism from [name]'s cultures.  Given lack of urinary symptoms and history will not prescribe antibiotics.   Madolyn Frieze 05/08/2013, 1:26 PM Infectious Diseases Pharmacist Phone# [phone]

## 2013-05-09 LAB — URINE CULTURE

## 2013-05-14 NOTE — Progress Notes (Signed)
PT evaluation addendum Late entry for 12-17-2012 G-codes  12-17-2012 1112  PT G-Codes **NOT FOR INPATIENT CLASS**  Functional Assessment Tool Used clinical judgement/chart review  Functional Limitation Mobility: Walking and moving around  Mobility: Walking and Moving Around Current Status [phone]) CM  Mobility: Walking and Moving Around Goal Status (F6213) CL  PT General Charges  $$ ACUTE PT VISIT 1 Procedure  PT Evaluation  $Initial PT Evaluation Tier I 1 Procedure  PT Treatments  $Therapeutic Activity 23-37 mins  05/14/2013 Corlis Hove, PT [phone]

## 2013-05-15 DIAGNOSIS — J309 Allergic rhinitis, unspecified: Secondary | ICD-10-CM | POA: Insufficient documentation

## 2013-05-15 DIAGNOSIS — G4700 Insomnia, unspecified: Secondary | ICD-10-CM | POA: Insufficient documentation

## 2013-05-17 ENCOUNTER — Encounter (HOSPITAL_COMMUNITY): Payer: Medicare Other

## 2013-05-20 ENCOUNTER — Telehealth: Payer: Self-pay | Admitting: *Deleted

## 2013-05-20 ENCOUNTER — Ambulatory Visit (INDEPENDENT_AMBULATORY_CARE_PROVIDER_SITE_OTHER): Payer: Medicare Other | Admitting: *Deleted

## 2013-05-20 DIAGNOSIS — J4489 Other specified chronic obstructive pulmonary disease: Secondary | ICD-10-CM

## 2013-05-20 DIAGNOSIS — I4891 Unspecified atrial fibrillation: Secondary | ICD-10-CM

## 2013-05-20 DIAGNOSIS — I509 Heart failure, unspecified: Secondary | ICD-10-CM

## 2013-05-20 DIAGNOSIS — I10 Essential (primary) hypertension: Secondary | ICD-10-CM

## 2013-05-20 DIAGNOSIS — J449 Chronic obstructive pulmonary disease, unspecified: Secondary | ICD-10-CM

## 2013-05-20 DIAGNOSIS — E119 Type 2 diabetes mellitus without complications: Secondary | ICD-10-CM

## 2013-05-20 DIAGNOSIS — Z7901 Long term (current) use of anticoagulants: Secondary | ICD-10-CM

## 2013-05-20 NOTE — Telephone Encounter (Signed)
See coumadin note. 

## 2013-05-20 NOTE — Telephone Encounter (Signed)
PT 1.9 INR 23.3

## 2013-05-24 ENCOUNTER — Telehealth: Payer: Self-pay | Admitting: *Deleted

## 2013-05-24 LAB — POCT INR: INR: 1.8

## 2013-05-24 NOTE — Telephone Encounter (Signed)
Inr 1.8 pt 21.4 pt is on 2.5mg  daily

## 2013-05-25 ENCOUNTER — Ambulatory Visit (INDEPENDENT_AMBULATORY_CARE_PROVIDER_SITE_OTHER): Payer: Medicare Other | Admitting: *Deleted

## 2013-05-25 DIAGNOSIS — Z7901 Long term (current) use of anticoagulants: Secondary | ICD-10-CM

## 2013-06-01 ENCOUNTER — Ambulatory Visit (INDEPENDENT_AMBULATORY_CARE_PROVIDER_SITE_OTHER): Payer: Medicare Other | Admitting: *Deleted

## 2013-06-01 DIAGNOSIS — Z7901 Long term (current) use of anticoagulants: Secondary | ICD-10-CM

## 2013-06-01 LAB — POCT INR: INR: 2.1

## 2013-06-08 ENCOUNTER — Ambulatory Visit (INDEPENDENT_AMBULATORY_CARE_PROVIDER_SITE_OTHER): Payer: Medicare Other | Admitting: *Deleted

## 2013-06-08 DIAGNOSIS — Z7901 Long term (current) use of anticoagulants: Secondary | ICD-10-CM

## 2013-06-09 ENCOUNTER — Encounter (HOSPITAL_COMMUNITY): Payer: Self-pay

## 2013-06-09 ENCOUNTER — Ambulatory Visit (HOSPITAL_COMMUNITY)
Admission: RE | Admit: 2013-06-09 | Discharge: 2013-06-09 | Disposition: A | Discharge status: Home / Self Care | Payer: Medicare Other | Source: Ambulatory Visit | Attending: Internal Medicine | Admitting: Internal Medicine

## 2013-06-09 VITALS — BP 140/76 | HR 95 | Wt 205.0 lb

## 2013-06-09 DIAGNOSIS — I482 Chronic atrial fibrillation, unspecified: Secondary | ICD-10-CM

## 2013-06-09 DIAGNOSIS — I4891 Unspecified atrial fibrillation: Secondary | ICD-10-CM

## 2013-06-09 DIAGNOSIS — I5032 Chronic diastolic (congestive) heart failure: Secondary | ICD-10-CM

## 2013-06-09 DIAGNOSIS — I10 Essential (primary) hypertension: Secondary | ICD-10-CM | POA: Insufficient documentation

## 2013-06-09 MED ORDER — METOLAZONE 2.5 MG PO TABS: ORAL_TABLET | ORAL | Status: DC

## 2013-06-09 NOTE — Patient Instructions (Addendum)
Start taking 2.5 mg metolazone on Wednesdays, do not take if home weight on scale less than 200 lbs. Call if weight on scale greater than 205 lbs.  Follow up in 1 month.  Do the following things EVERYDAY: 1) Weigh yourself in the morning before breakfast. Write it down and keep it in a log. 2) Take your medicines as prescribed 3) Eat low salt foods-Limit salt (sodium) to 2000 mg per day.  4) Stay as active as you can everyday 5) Limit all fluids for the day to less than 2 liters

## 2013-06-09 NOTE — Progress Notes (Signed)
Patient ID: Gina Ashley, female   DOB: 1926/12/06, 79 y.o.   MRN: [id number]  Weight Range  217 pounds  Baseline proBNP     HPI: Gina Ashley is an 79 y.o. female with history of chronic atrial fibrillation on coumadin, diastolic heart failure and HTN. She also has DM type 2, COPD, OSA on CPAP, A Fib- chronic coumadin, and CKD failure with baseline Cr 2.2. She has had 5 hospitalizations in the last 6 months, 1 included tibia/fibula fracture, 1 for UTI, and the rest have been due to massive fluid overload in the setting of diastolic heart failure. Per nephrology she is not a candidate for HD.   Admitted to [facility]  03/20/13 with increased dyspnea and lower extremity edema. Diuresed with IV lasix and transitioned to Demadex 60 mg bid. She received R thoracentesis.  Discharge weight 217 pounds. Discharged to [facility] 03/30/13. Palliative Care to follow at SNF.   Follow up: Feeling pretty good. At home now, left SNF at 200 lbs and today is 205 lbs. Taking medications as prescribed, has not needed any metolazone. Denies PND/orthopnea. Minimal SOB that comes and goes. Wears CPAP at night. HH RN, PT/OT following. Doing more ADL and able to walk with a walker. Limits fluids to <1.5 liters a day.   05/03/13 Creatinine 2.5 Potassium 3.9 05/10/13 Creatinine 2.53, K+ 3.9  ROS: All systems negative except as listed in HPI, PMH and Problem List.  Past Medical History  Diagnosis Date  . GERD (gastroesophageal reflux disease)   . Hiatal hernia   . HTN (hypertension)     all her life  . CKD (chronic kidney disease), stage III     GFR: 38  . Atrial fibrillation     since 1996  . Depression   . Chronic diastolic heart failure 08/31/2012  . Tibia fracture     BILATERAL  . Renal failure     acute on chronic   . Respiratory failure     acute on chronic hx of  requiring BIPAP  . COPD (chronic obstructive pulmonary disease)   . Metabolic encephalopathy     hx of   . Renal failure     acute on chronic  with need for intermittent dialysis - hx of   . UTI (urinary tract infection)     hx of   . Pleural effusion     right sided now resolved   . CHF (congestive heart failure)     diastolic HF  . Complication of anesthesia     "hard to wake up" (03/11/2013)  . OSA on CPAP   . Type II diabetes mellitus 1980's?  . History of blood transfusion 08/2012    "related to leg OR" (03/11/2013)  . Arthritis     "left arm; lower back, hips, hands" (03/11/2013)  . Chronic lower back pain     "disc pops in and out" (03/11/2013)  . Gout   . Anxiety   . Fall 08/2012    "in nursing home" (03/11/2013)  . Anemia   . Chronic a-fib 03/12/2013    Current Outpatient Prescriptions  Medication Sig Dispense Refill  . acetaminophen (TYLENOL) 325 MG tablet Take 650 mg by mouth every 6 (six) hours as needed for pain.      Marland Kitchen allopurinol (ZYLOPRIM) 100 MG tablet Take 100 mg by mouth daily.      Marland Kitchen ALPRAZolam (XANAX) 0.25 MG tablet Take one half tablet by mouth every 8 hours as needed for anxiety  45 tablet  5  . amiodarone (PACERONE) 200 MG tablet Take 400 mg by mouth daily.      . busPIRone (BUSPAR) 5 MG tablet Take 1 tablet (5 mg total) by mouth every 12 (twelve) hours.  30 tablet  0  . carvedilol (COREG) 25 MG tablet Take 25 mg by mouth 2 (two) times daily with a meal.      . docusate sodium (COLACE) 100 MG capsule Take 100 mg by mouth every 8 (eight) hours.       . fluticasone (FLONASE) 50 MCG/ACT nasal spray Place 1 spray into the nose daily.      Marland Kitchen guaiFENesin (MUCINEX) 600 MG 12 hr tablet Take 600 mg by mouth 2 (two) times daily.       Marland Kitchen HYDROcodone-acetaminophen (NORCO) 7.5-325 MG per tablet Take 1 tablet by mouth at bedtime as needed for pain.      Marland Kitchen insulin aspart (NOVOLOG) 100 UNIT/ML injection Inject 5 Units into the skin 3 (three) times daily with meals. If cbg is greater than 150      . insulin glargine (LANTUS) 100 UNIT/ML injection Inject 5 Units into the skin at bedtime.      . nitroGLYCERIN (NITROSTAT) 0.4  MG SL tablet Place 0.4 mg under the tongue every 5 (five) minutes as needed for chest pain.      Marland Kitchen omeprazole (PRILOSEC) 20 MG capsule Take 40 mg by mouth daily.      . ondansetron (ZOFRAN) 4 MG tablet Take 1 tablet (4 mg total) by mouth every 6 (six) hours as needed for nausea.  20 tablet    . polyethylene glycol (MIRALAX / GLYCOLAX) packet Take 17 g by mouth 2 (two) times daily.       Marland Kitchen torsemide (DEMADEX) 20 MG tablet Take 4 tablets (80 mg total) by mouth 2 (two) times daily.  60 tablet  0  . traZODone (DESYREL) 50 MG tablet Take 50 mg by mouth at bedtime.      Marland Kitchen warfarin (COUMADIN) 5 MG tablet Take 2.5 mg by mouth daily.      . Melatonin 3 MG CAPS Take 1 capsule by mouth at bedtime.      . metolazone (ZAROXOLYN) 2.5 MG tablet Take 1 tablet (2.5 mg total) by mouth as needed.  10 tablet  6   No current facility-administered medications for this encounter.    PHYSICAL EXAM: Filed Vitals:   06/09/13 1143  BP: 140/76  Pulse: 95  Weight: 205 lb (92.987 kg)  SpO2: 94%  Last weight: 198  General:  Chronically ill. Elderly sitting in wheel chair with her daughter HEENT: normal Neck: supple. JVP 8-9. Carotids 2+ bilaterally; no bruits. No lymphadenopathy or thryomegaly appreciated. Cor: PMI normal. Regular rate & rhythm. No rubs, gallops or murmurs. Lungs: clear Abdomen: obese, soft, nontender, nondistended. No hepatosplenomegaly. No bruits or masses. Good bowel sounds. Extremities: no cyanosis, clubbing, rash,  R and LLE 1+ edema ted hose. LLE brace edema Neuro: alert & orientedx3, cranial nerves grossly intact. Moves all 4 extremities w/o difficulty. Affect pleasant.  ASSESSMENT & PLAN:  1) Chronic diastolic HF, EF 55-60%, mild MR, mod TR  - NYHA III. Volume status slightly elevated. Weight up 7 lbs since last visit - Will have patient start taking 2.5 mg metolazone on Wednesdays including today along with an extra 20 meq K+ - Re-instructed patient and family to call if weight greater  than 205 on home scale, today weight was 203 - Followed by [facility],  will get BMET next Thursday. - Reinforced the need and importance of daily weights, a low sodium diet, and fluid restriction (less than 2 L a day).  - Follow up 1 month  2) HTN -Slightly elevated however fluid status up - continue BB - If remains elevated can consider adding some spironolactone next visit for fluid and BP effects.  3) Chronic A-Fib - Rate controlled, will continue Amiodarone and Coumadin - Can try to cut amiodarone back next visit - Will need to check LFTs, TSH and talk to patient about getting yearly eye exam while she is on amiodarone.   Ulla Potash B NP-C 5:11 PM

## 2013-06-13 ENCOUNTER — Emergency Department (HOSPITAL_COMMUNITY): Payer: Medicare Other

## 2013-06-13 ENCOUNTER — Encounter (HOSPITAL_COMMUNITY): Payer: Self-pay | Admitting: Emergency Medicine

## 2013-06-13 ENCOUNTER — Emergency Department (HOSPITAL_COMMUNITY)
Admission: EM | Admit: 2013-06-13 | Discharge: 2013-06-13 | Disposition: A | Discharge status: Home / Self Care | Payer: Medicare Other | Attending: Emergency Medicine | Admitting: Emergency Medicine

## 2013-06-13 DIAGNOSIS — G8929 Other chronic pain: Secondary | ICD-10-CM | POA: Insufficient documentation

## 2013-06-13 DIAGNOSIS — I129 Hypertensive chronic kidney disease with stage 1 through stage 4 chronic kidney disease, or unspecified chronic kidney disease: Secondary | ICD-10-CM | POA: Insufficient documentation

## 2013-06-13 DIAGNOSIS — J159 Unspecified bacterial pneumonia: Secondary | ICD-10-CM | POA: Insufficient documentation

## 2013-06-13 DIAGNOSIS — Z8719 Personal history of other diseases of the digestive system: Secondary | ICD-10-CM | POA: Insufficient documentation

## 2013-06-13 DIAGNOSIS — Z880 Allergy status to penicillin: Secondary | ICD-10-CM | POA: Insufficient documentation

## 2013-06-13 DIAGNOSIS — J449 Chronic obstructive pulmonary disease, unspecified: Secondary | ICD-10-CM | POA: Insufficient documentation

## 2013-06-13 DIAGNOSIS — J4489 Other specified chronic obstructive pulmonary disease: Secondary | ICD-10-CM | POA: Insufficient documentation

## 2013-06-13 DIAGNOSIS — Z862 Personal history of diseases of the blood and blood-forming organs and certain disorders involving the immune mechanism: Secondary | ICD-10-CM | POA: Insufficient documentation

## 2013-06-13 DIAGNOSIS — N390 Urinary tract infection, site not specified: Secondary | ICD-10-CM | POA: Insufficient documentation

## 2013-06-13 DIAGNOSIS — R0602 Shortness of breath: Secondary | ICD-10-CM | POA: Insufficient documentation

## 2013-06-13 DIAGNOSIS — E119 Type 2 diabetes mellitus without complications: Secondary | ICD-10-CM | POA: Insufficient documentation

## 2013-06-13 DIAGNOSIS — Z8709 Personal history of other diseases of the respiratory system: Secondary | ICD-10-CM | POA: Insufficient documentation

## 2013-06-13 DIAGNOSIS — J189 Pneumonia, unspecified organism: Secondary | ICD-10-CM

## 2013-06-13 DIAGNOSIS — F411 Generalized anxiety disorder: Secondary | ICD-10-CM | POA: Insufficient documentation

## 2013-06-13 DIAGNOSIS — Z8781 Personal history of (healed) traumatic fracture: Secondary | ICD-10-CM | POA: Insufficient documentation

## 2013-06-13 DIAGNOSIS — I4891 Unspecified atrial fibrillation: Secondary | ICD-10-CM | POA: Insufficient documentation

## 2013-06-13 DIAGNOSIS — G4733 Obstructive sleep apnea (adult) (pediatric): Secondary | ICD-10-CM | POA: Insufficient documentation

## 2013-06-13 DIAGNOSIS — N183 Chronic kidney disease, stage 3 unspecified: Secondary | ICD-10-CM | POA: Insufficient documentation

## 2013-06-13 DIAGNOSIS — F329 Major depressive disorder, single episode, unspecified: Secondary | ICD-10-CM | POA: Insufficient documentation

## 2013-06-13 DIAGNOSIS — IMO0002 Reserved for concepts with insufficient information to code with codable children: Secondary | ICD-10-CM | POA: Insufficient documentation

## 2013-06-13 DIAGNOSIS — Z87442 Personal history of urinary calculi: Secondary | ICD-10-CM | POA: Insufficient documentation

## 2013-06-13 DIAGNOSIS — Z87448 Personal history of other diseases of urinary system: Secondary | ICD-10-CM | POA: Insufficient documentation

## 2013-06-13 DIAGNOSIS — E860 Dehydration: Secondary | ICD-10-CM | POA: Insufficient documentation

## 2013-06-13 DIAGNOSIS — K219 Gastro-esophageal reflux disease without esophagitis: Secondary | ICD-10-CM | POA: Insufficient documentation

## 2013-06-13 DIAGNOSIS — F3289 Other specified depressive episodes: Secondary | ICD-10-CM | POA: Insufficient documentation

## 2013-06-13 DIAGNOSIS — I5032 Chronic diastolic (congestive) heart failure: Secondary | ICD-10-CM | POA: Insufficient documentation

## 2013-06-13 DIAGNOSIS — M129 Arthropathy, unspecified: Secondary | ICD-10-CM | POA: Insufficient documentation

## 2013-06-13 DIAGNOSIS — M109 Gout, unspecified: Secondary | ICD-10-CM | POA: Insufficient documentation

## 2013-06-13 DIAGNOSIS — Z794 Long term (current) use of insulin: Secondary | ICD-10-CM | POA: Insufficient documentation

## 2013-06-13 DIAGNOSIS — Z7901 Long term (current) use of anticoagulants: Secondary | ICD-10-CM | POA: Insufficient documentation

## 2013-06-13 DIAGNOSIS — Z79899 Other long term (current) drug therapy: Secondary | ICD-10-CM | POA: Insufficient documentation

## 2013-06-13 DIAGNOSIS — Z8669 Personal history of other diseases of the nervous system and sense organs: Secondary | ICD-10-CM | POA: Insufficient documentation

## 2013-06-13 LAB — URINALYSIS, ROUTINE W REFLEX MICROSCOPIC
Bilirubin Urine: NEGATIVE
Hgb urine dipstick: NEGATIVE
Ketones, ur: NEGATIVE mg/dL
Protein, ur: NEGATIVE mg/dL
Urobilinogen, UA: 1 mg/dL (ref 0.0–1.0)

## 2013-06-13 LAB — BASIC METABOLIC PANEL
BUN: 75 mg/dL — ABNORMAL HIGH (ref 6–23)
Calcium: 9.8 mg/dL (ref 8.4–10.5)
GFR calc Af Amer: 16 mL/min — ABNORMAL LOW (ref >90)
GFR calc non Af Amer: 14 mL/min — ABNORMAL LOW (ref >90)
Glucose, Bld: 257 mg/dL — ABNORMAL HIGH (ref 70–99)

## 2013-06-13 LAB — CBC WITH DIFFERENTIAL/PLATELET
Basophils Relative: 0 % (ref 0–1)
Eosinophils Absolute: 0.1 10*3/uL (ref 0.0–0.7)
Eosinophils Relative: 2 % (ref 0–5)
Lymphs Abs: 0.9 10*3/uL (ref 0.7–4.0)
MCH: 28.7 pg (ref 26.0–34.0)
MCHC: 32.4 g/dL (ref 30.0–36.0)
MCV: 88.7 fL (ref 78.0–100.0)
Monocytes Relative: 7 % (ref 3–12)
Platelets: 107 10*3/uL — ABNORMAL LOW (ref 150–400)
RBC: 3.9 MIL/uL (ref 3.87–5.11)

## 2013-06-13 LAB — POCT I-STAT TROPONIN I: Troponin i, poc: 0.02 ng/mL (ref 0.00–0.08)

## 2013-06-13 LAB — POCT I-STAT 3, ART BLOOD GAS (G3+)
pCO2 arterial: 59.8 mmHg (ref 35.0–45.0)
pH, Arterial: 7.48 — ABNORMAL HIGH (ref 7.350–7.450)
pO2, Arterial: 95 mmHg (ref 80.0–100.0)

## 2013-06-13 LAB — URINE MICROSCOPIC-ADD ON

## 2013-06-13 LAB — PROTIME-INR: INR: 2.14 — ABNORMAL HIGH (ref 0.00–1.49)

## 2013-06-13 MED ORDER — LEVOFLOXACIN 750 MG PO TABS: 750.0000 mg | ORAL_TABLET | Freq: Every day | ORAL | Status: DC

## 2013-06-13 MED ORDER — AZITHROMYCIN 250 MG PO TABS: 500.0000 mg | ORAL_TABLET | Freq: Once | ORAL | Status: DC

## 2013-06-13 MED ORDER — LEVOFLOXACIN IN D5W 750 MG/150ML IV SOLN
750.0000 mg | Freq: Once | INTRAVENOUS | Status: AC
  Administered 2013-06-13: 750 mg via INTRAVENOUS
  Filled 2013-06-13: qty 150

## 2013-06-13 NOTE — ED Provider Notes (Signed)
CSN: [id number]     Arrival date & time 06/13/13  1508 History     First MD Initiated Contact with Patient 06/13/13 1531     Chief Complaint  Patient presents with  . Weakness   (Consider location/radiation/quality/duration/timing/severity/associated sxs/prior Treatment) The history is provided by the patient.  Annakate Soulier is a 79 y.o. female history hypertension, CK D., heart failure here presenting with weakness. She saw her cardiologist 4 days ago and was given metolazone (weekly medication). She has been urinating a lot since then and has been feeling dehydrated. She lost 3 pounds in several days. She also had diffuse weakness and just feeling tired. Denies fevers or chills and says that her leg swelling has been improving. Her shortness of breath is baseline and she uses oxygen at home.    Past Medical History  Diagnosis Date  . GERD (gastroesophageal reflux disease)   . Hiatal hernia   . HTN (hypertension)     all her life  . CKD (chronic kidney disease), stage III     GFR: 38  . Atrial fibrillation     since 1996  . Depression   . Chronic diastolic heart failure 08/31/2012  . Tibia fracture     BILATERAL  . Renal failure     acute on chronic   . Respiratory failure     acute on chronic hx of  requiring BIPAP  . COPD (chronic obstructive pulmonary disease)   . Metabolic encephalopathy     hx of   . Renal failure     acute on chronic with need for intermittent dialysis - hx of   . UTI (urinary tract infection)     hx of   . Pleural effusion     right sided now resolved   . CHF (congestive heart failure)     diastolic HF  . Complication of anesthesia     "hard to wake up" (03/11/2013)  . OSA on CPAP   . Type II diabetes mellitus 1980's?  . History of blood transfusion 08/2012    "related to leg OR" (03/11/2013)  . Arthritis     "left arm; lower back, hips, hands" (03/11/2013)  . Chronic lower back pain     "disc pops in and out" (03/11/2013)  . Gout   . Anxiety    . Fall 08/2012    "in nursing home" (03/11/2013)  . Anemia   . Chronic a-fib 03/12/2013   Past Surgical History  Procedure Laterality Date  . Bunionectomy Bilateral     bilateral  . Cholecystectomy    . Shoulder arthroscopy w/ rotator cuff repair Left 2002  . Total knee arthroplasty Bilateral 2001  . Hammer toe surgery Bilateral 2004  . Colonoscopy  07/11/2011    Procedure: COLONOSCOPY;  Surgeon: Malissa Hippo, MD;  Location: [facility];  Service: Endoscopy;  Laterality: N/A;  3:00  . Orif periprosthetic fracture  09/08/2012    Procedure: OPEN REDUCTION INTERNAL FIXATION (ORIF) PERIPROSTHETIC FRACTURE;  Surgeon: Shelda Pal, MD;  Location: [facility] ORS;  Service: Orthopedics;  Laterality: Right;  ORIF periprosthetic right proximal femur fracture   . External fixation leg  09/08/2012    Procedure: EXTERNAL FIXATION LEG;  Surgeon: Shelda Pal, MD;  Location: [facility] ORS;  Service: Orthopedics;  Laterality: Left;  . Joint replacement    . External fixation leg  11/30/2012    Procedure: EXTERNAL FIXATION LEG;  Surgeon: Shelda Pal, MD;  Location: [facility];  Service: Orthopedics;  Laterality: Left;  Removal of External Fixation Left Knee with Evaluation with Floroscopy  . Tee without cardioversion N/A 01/19/2013    Procedure: TRANSESOPHAGEAL ECHOCARDIOGRAM (TEE);  Surgeon: Lewayne Bunting, MD;  Location: [facility] ENDOSCOPY;  Service: Cardiovascular;  Laterality: N/A;  . Shoulder open rotator cuff repair Left 2002    "maybe 3 months after scope" (03/11/2013)  . Femur fracture surgery Right 08/2012    "fell at nursing home" (03/11/2013)  . Tibia fracture surgery Left 08/2012    "fell at nursing home" (03/11/2013)   Family History  Problem Relation Age of Onset  . Colon cancer Brother    History  Substance Use Topics  . Smoking status: Never Smoker   . Smokeless tobacco: Never Used  . Alcohol Use: No   OB History   Grav Para Term Preterm Abortions TAB SAB Ect Mult Living                 Review  of Systems  Neurological: Positive for weakness.  All other systems reviewed and are negative.    Allergies  Morphine sulfate; Remeron; Tuna; and Penicillins  Home Medications   Current Outpatient Rx  Name  Route  Sig  Dispense  Refill  . acetaminophen (TYLENOL) 325 MG tablet   Oral   Take 650 mg by mouth every 6 (six) hours as needed for pain.         Marland Kitchen allopurinol (ZYLOPRIM) 100 MG tablet   Oral   Take 100 mg by mouth daily.         Marland Kitchen ALPRAZolam (XANAX) 0.25 MG tablet      Take one half tablet by mouth every 8 hours as needed for anxiety   45 tablet   5   . amiodarone (PACERONE) 200 MG tablet   Oral   Take 400 mg by mouth daily.         . busPIRone (BUSPAR) 5 MG tablet   Oral   Take 5 mg by mouth every 12 (twelve) hours.          . carvedilol (COREG) 25 MG tablet   Oral   Take 25 mg by mouth 2 (two) times daily with a meal.         . docusate sodium (COLACE) 100 MG capsule   Oral   Take 100 mg by mouth every 8 (eight) hours.          . fluticasone (FLONASE) 50 MCG/ACT nasal spray   Nasal   Place 1 spray into the nose daily.         Marland Kitchen guaiFENesin (MUCINEX) 600 MG 12 hr tablet   Oral   Take 600 mg by mouth 2 (two) times daily.          Marland Kitchen HYDROcodone-acetaminophen (NORCO) 7.5-325 MG per tablet   Oral   Take 1 tablet by mouth at bedtime as needed for pain.         Marland Kitchen insulin aspart (NOVOLOG) 100 UNIT/ML injection   Subcutaneous   Inject 5 Units into the skin 3 (three) times daily with meals. If cbg is greater than 150         . insulin glargine (LANTUS) 100 UNIT/ML injection   Subcutaneous   Inject 5 Units into the skin at bedtime.         . Melatonin 3 MG CAPS   Oral   Take 1 capsule by mouth at bedtime.         . metolazone (ZAROXOLYN) 2.5  MG tablet      Take 2.5 mg every Wednesday or if weight greater than 205 lbs   15 tablet   6     Take 2.5 mg if her weight is 201 pounds or greater ...   . nitroGLYCERIN (NITROSTAT)  0.4 MG SL tablet   Sublingual   Place 0.4 mg under the tongue every 5 (five) minutes as needed for chest pain.         Marland Kitchen omeprazole (PRILOSEC) 20 MG capsule   Oral   Take 40 mg by mouth daily.          . ondansetron (ZOFRAN) 4 MG tablet   Oral   Take 1 tablet (4 mg total) by mouth every 6 (six) hours as needed for nausea.   20 tablet      . polyethylene glycol (MIRALAX / GLYCOLAX) packet   Oral   Take 17 g by mouth 2 (two) times daily.          Marland Kitchen torsemide (DEMADEX) 20 MG tablet   Oral   Take 4 tablets (80 mg total) by mouth 2 (two) times daily.   60 tablet   0   . traZODone (DESYREL) 50 MG tablet   Oral   Take 50 mg by mouth at bedtime.         Marland Kitchen warfarin (COUMADIN) 5 MG tablet   Oral   Take 2.5 mg by mouth daily.          BP 129/86  Pulse 86  Temp(Src) 97.8 F (36.6 C) (Oral)  Resp 16  Ht 5\' 4"  (1.626 m)  Wt 199 lb (90.266 kg)  BMI 34.14 kg/m2  SpO2 100% Physical Exam  Nursing note and vitals reviewed. Constitutional: She is oriented to person, place, and time.  Chronically ill, NAD, tired   HENT:  Head: Normocephalic.  Mouth/Throat: Oropharynx is clear and moist.  Eyes: Conjunctivae are normal. Pupils are equal, round, and reactive to light.  Neck: Normal range of motion. Neck supple.  Cardiovascular: Normal rate, regular rhythm and normal heart sounds.   Pulmonary/Chest: Effort normal.  Decreased breath sounds bilateral bases   Abdominal: Soft. Bowel sounds are normal. She exhibits no distension. There is no tenderness. There is no rebound and no guarding.  Musculoskeletal: Normal range of motion.  2+ edema (chronic), no calf tenderness   Neurological: She is alert and oriented to person, place, and time.  4/5 strength throughout   Skin: Skin is warm and dry.  Psychiatric: She has a normal mood and affect. Her behavior is normal. Judgment and thought content normal.    ED Course   Procedures (including critical care time)  Labs Reviewed   CBC WITH DIFFERENTIAL - Abnormal; Notable for the following:    Hemoglobin 11.2 (*)    HCT 34.6 (*)    RDW 15.8 (*)    Platelets 107 (*)    All other components within normal limits  BASIC METABOLIC PANEL - Abnormal; Notable for the following:    Chloride 87 (*)    CO2 41 (*)    Glucose, Bld 257 (*)    BUN 75 (*)    Creatinine, Ser 2.88 (*)    GFR calc non Af Amer 14 (*)    GFR calc Af Amer 16 (*)    All other components within normal limits  URINALYSIS, ROUTINE W REFLEX MICROSCOPIC - Abnormal; Notable for the following:    Leukocytes, UA MODERATE (*)    All other components  within normal limits  PRO B NATRIURETIC PEPTIDE - Abnormal; Notable for the following:    Pro B Natriuretic peptide (BNP) 3655.0 (*)    All other components within normal limits  PROTIME-INR - Abnormal; Notable for the following:    Prothrombin Time 23.2 (*)    INR 2.14 (*)    All other components within normal limits  URINE MICROSCOPIC-ADD ON - Abnormal; Notable for the following:    Bacteria, UA MANY (*)    All other components within normal limits  POCT I-STAT 3, BLOOD GAS (G3+) - Abnormal; Notable for the following:    pH, Arterial 7.480 (*)    pCO2 arterial 59.8 (*)    Bicarbonate 44.7 (*)    Acid-Base Excess 18.0 (*)    All other components within normal limits  URINE CULTURE  BLOOD GAS, ARTERIAL  POCT I-STAT TROPONIN I   Dg Chest 2 View  06/13/2013   *RADIOLOGY REPORT*  Clinical Data: Weakness.  Gastroesophageal reflux disease.  COPD. Respiratory failure.  CHEST - 2 VIEW  Comparison: 05/03/2013  Findings: Lateral view degraded by patient arm position.  Moderate cardiomegaly with a tortuous descending thoracic aorta. Moderate right-sided pleural effusion is increased.  No left-sided effusion. No pneumothorax.  Interstitial edema is mild and similar. Worsened right lower lobe airspace disease.  IMPRESSION: Mild congestive heart failure.  Worsened right-sided aeration with increased moderate pleural  effusion and adjacent airspace disease.  This airspace disease is suspicious for concurrent infection. Recommend radiographic follow- up until clearing.   Original Report Authenticated By: Jeronimo Greaves, M.D.   No diagnosis found.  MDM  Zelpha Messing is a 79 y.o. female here with diffuse weakness. No signs of stroke. She appeared slightly dehydrated most likely from over diuresis. Will check labs and BNP. Will get cxr and UA to r/o infection. Will recommend discussing with her cardiologist regarding metolazone.   8:21 PM CXR ? Pneumonia. + UTI. WBC nl. CO2 elevated, but at baseline. ABG not concerning. I offered admission vs PO abx and outpatient f/u. She rather not stay in the hospital. She is given levaquin IV in the ED and will d/c home on levaquin.    Richardean Canal, MD 06/13/13 2022

## 2013-06-13 NOTE — ED Notes (Signed)
Carb modified diet ordered for pt 

## 2013-06-13 NOTE — ED Notes (Signed)
Patient presents to ED with complaints of weakness for the past 3 days.

## 2013-06-15 ENCOUNTER — Ambulatory Visit (INDEPENDENT_AMBULATORY_CARE_PROVIDER_SITE_OTHER): Payer: Medicare Other | Admitting: *Deleted

## 2013-06-15 ENCOUNTER — Telehealth (HOSPITAL_COMMUNITY): Payer: Self-pay | Admitting: *Deleted

## 2013-06-15 ENCOUNTER — Telehealth: Payer: Self-pay | Admitting: *Deleted

## 2013-06-15 DIAGNOSIS — Z7901 Long term (current) use of anticoagulants: Secondary | ICD-10-CM

## 2013-06-15 LAB — POCT INR: INR: 2.1

## 2013-06-15 NOTE — Telephone Encounter (Signed)
See coumadin note from today. 

## 2013-06-15 NOTE — Telephone Encounter (Signed)
Currently taking coumadin 2.5 mg daily except on Tuesday 5 mg. Patient was at [facility] Sunday for UTI and started on levaquin 750 mg daily x's 7 days and started this on Sunday.

## 2013-06-15 NOTE — ED Notes (Signed)
+   Klebsiella Pneumoniae-called from [facility].Current scriber consulted Celedonio Miyamoto Pharm.D. to see how to precede : recommendation is that since patient is feeling better,he will review chart with EDP in AM.

## 2013-06-16 LAB — URINE CULTURE

## 2013-06-16 NOTE — Progress Notes (Signed)
ED Antimicrobial Stewardship Positive Culture Follow Up   Gina Ashley is an 79 y.o. female who presented to [facility] on 06/13/2013 with a chief complaint of  Chief Complaint  Patient presents with  . Weakness    Recent Results (from the past 720 hour(s))  URINE CULTURE     Status: None   Collection Time    06/13/13  5:35 PM      Result Value Range Status   Specimen Description URINE, RANDOM   Final   Special Requests NONE   Final   Culture  Setup Time     Final   Value: 06/14/2013 02:53     Performed at [facility] Count     Final   Value: >=100,000 COLONIES/ML     Performed at [facility]   Culture     Final   Value: KLEBSIELLA PNEUMONIAE     Note: THIS ISOLATE HAS PREVIOUSLY BEEN CONFIRMED AS A [facility] CARBAPENEMASE PRODUCER CRITICAL RESULT CALLED TO, READ BACK BY AND VERIFIED WITH: JANET GREENE@2 :45PM ON 06/15/13 BY DANTS     Performed at [facility]   Report Status 06/16/2013 FINAL   Final   Organism ID, Bacteria KLEBSIELLA PNEUMONIAE   Final    [x]  Treated with levofloxacin, organism resistant to prescribed antimicrobial   The patient was contacted yesterday by the ED flow manager Gwynneth Aliment and reported clinical improvement.  As her isolate is resistant to levofloxacin, we will contact the patient and ask her to stopping this antibiotic.  ED Provider: Junious Silk, PAC   Mickeal Skinner 06/16/2013, 11:28 AM Infectious Diseases Pharmacist Phone# [phone]

## 2013-06-17 ENCOUNTER — Emergency Department (HOSPITAL_COMMUNITY): Payer: Medicare Other

## 2013-06-17 ENCOUNTER — Encounter (HOSPITAL_COMMUNITY): Payer: Self-pay | Admitting: *Deleted

## 2013-06-17 ENCOUNTER — Inpatient Hospital Stay (HOSPITAL_COMMUNITY)
Admission: EM | Admit: 2013-06-17 | Discharge: 2013-06-19 | DRG: 689 | Disposition: A | Discharge status: Home Health | Payer: Medicare Other | Attending: Internal Medicine | Admitting: Internal Medicine

## 2013-06-17 DIAGNOSIS — D638 Anemia in other chronic diseases classified elsewhere: Secondary | ICD-10-CM

## 2013-06-17 DIAGNOSIS — G4700 Insomnia, unspecified: Secondary | ICD-10-CM

## 2013-06-17 DIAGNOSIS — J90 Pleural effusion, not elsewhere classified: Secondary | ICD-10-CM

## 2013-06-17 DIAGNOSIS — A0472 Enterocolitis due to Clostridium difficile, not specified as recurrent: Secondary | ICD-10-CM

## 2013-06-17 DIAGNOSIS — E119 Type 2 diabetes mellitus without complications: Secondary | ICD-10-CM

## 2013-06-17 DIAGNOSIS — F411 Generalized anxiety disorder: Secondary | ICD-10-CM

## 2013-06-17 DIAGNOSIS — R627 Adult failure to thrive: Secondary | ICD-10-CM

## 2013-06-17 DIAGNOSIS — N390 Urinary tract infection, site not specified: Principal | ICD-10-CM | POA: Diagnosis present

## 2013-06-17 DIAGNOSIS — K5900 Constipation, unspecified: Secondary | ICD-10-CM

## 2013-06-17 DIAGNOSIS — I5033 Acute on chronic diastolic (congestive) heart failure: Secondary | ICD-10-CM

## 2013-06-17 DIAGNOSIS — Z66 Do not resuscitate: Secondary | ICD-10-CM | POA: Diagnosis present

## 2013-06-17 DIAGNOSIS — J4489 Other specified chronic obstructive pulmonary disease: Secondary | ICD-10-CM | POA: Diagnosis present

## 2013-06-17 DIAGNOSIS — I4891 Unspecified atrial fibrillation: Secondary | ICD-10-CM | POA: Diagnosis present

## 2013-06-17 DIAGNOSIS — Z7901 Long term (current) use of anticoagulants: Secondary | ICD-10-CM

## 2013-06-17 DIAGNOSIS — N184 Chronic kidney disease, stage 4 (severe): Secondary | ICD-10-CM | POA: Diagnosis present

## 2013-06-17 DIAGNOSIS — G4733 Obstructive sleep apnea (adult) (pediatric): Secondary | ICD-10-CM

## 2013-06-17 DIAGNOSIS — J449 Chronic obstructive pulmonary disease, unspecified: Secondary | ICD-10-CM | POA: Diagnosis present

## 2013-06-17 DIAGNOSIS — J189 Pneumonia, unspecified organism: Secondary | ICD-10-CM | POA: Diagnosis present

## 2013-06-17 DIAGNOSIS — M109 Gout, unspecified: Secondary | ICD-10-CM | POA: Diagnosis present

## 2013-06-17 DIAGNOSIS — R0902 Hypoxemia: Secondary | ICD-10-CM

## 2013-06-17 DIAGNOSIS — Z515 Encounter for palliative care: Secondary | ICD-10-CM

## 2013-06-17 DIAGNOSIS — I5032 Chronic diastolic (congestive) heart failure: Secondary | ICD-10-CM

## 2013-06-17 DIAGNOSIS — B961 Klebsiella pneumoniae [K. pneumoniae] as the cause of diseases classified elsewhere: Secondary | ICD-10-CM | POA: Diagnosis present

## 2013-06-17 DIAGNOSIS — I509 Heart failure, unspecified: Secondary | ICD-10-CM | POA: Diagnosis present

## 2013-06-17 DIAGNOSIS — E876 Hypokalemia: Secondary | ICD-10-CM

## 2013-06-17 DIAGNOSIS — R531 Weakness: Secondary | ICD-10-CM

## 2013-06-17 DIAGNOSIS — E669 Obesity, unspecified: Secondary | ICD-10-CM

## 2013-06-17 DIAGNOSIS — K219 Gastro-esophageal reflux disease without esophagitis: Secondary | ICD-10-CM

## 2013-06-17 DIAGNOSIS — Z79899 Other long term (current) drug therapy: Secondary | ICD-10-CM | POA: Diagnosis not present

## 2013-06-17 DIAGNOSIS — N189 Chronic kidney disease, unspecified: Secondary | ICD-10-CM

## 2013-06-17 DIAGNOSIS — I129 Hypertensive chronic kidney disease with stage 1 through stage 4 chronic kidney disease, or unspecified chronic kidney disease: Secondary | ICD-10-CM | POA: Diagnosis present

## 2013-06-17 DIAGNOSIS — J309 Allergic rhinitis, unspecified: Secondary | ICD-10-CM

## 2013-06-17 DIAGNOSIS — F329 Major depressive disorder, single episode, unspecified: Secondary | ICD-10-CM

## 2013-06-17 DIAGNOSIS — Z794 Long term (current) use of insulin: Secondary | ICD-10-CM | POA: Diagnosis not present

## 2013-06-17 DIAGNOSIS — I482 Chronic atrial fibrillation, unspecified: Secondary | ICD-10-CM

## 2013-06-17 DIAGNOSIS — I480 Paroxysmal atrial fibrillation: Secondary | ICD-10-CM

## 2013-06-17 DIAGNOSIS — A498 Other bacterial infections of unspecified site: Secondary | ICD-10-CM

## 2013-06-17 DIAGNOSIS — R5381 Other malaise: Secondary | ICD-10-CM

## 2013-06-17 DIAGNOSIS — Z1613 Resistance to carbapenem: Secondary | ICD-10-CM

## 2013-06-17 DIAGNOSIS — Z228 Carrier of other infectious diseases: Secondary | ICD-10-CM

## 2013-06-17 DIAGNOSIS — N039 Chronic nephritic syndrome with unspecified morphologic changes: Secondary | ICD-10-CM | POA: Diagnosis present

## 2013-06-17 DIAGNOSIS — Z96659 Presence of unspecified artificial knee joint: Secondary | ICD-10-CM

## 2013-06-17 DIAGNOSIS — R0602 Shortness of breath: Secondary | ICD-10-CM

## 2013-06-17 DIAGNOSIS — D631 Anemia in chronic kidney disease: Secondary | ICD-10-CM | POA: Diagnosis present

## 2013-06-17 DIAGNOSIS — N179 Acute kidney failure, unspecified: Secondary | ICD-10-CM

## 2013-06-17 DIAGNOSIS — E871 Hypo-osmolality and hyponatremia: Secondary | ICD-10-CM

## 2013-06-17 DIAGNOSIS — I1310 Hypertensive heart and chronic kidney disease without heart failure, with stage 1 through stage 4 chronic kidney disease, or unspecified chronic kidney disease: Secondary | ICD-10-CM

## 2013-06-17 DIAGNOSIS — M549 Dorsalgia, unspecified: Secondary | ICD-10-CM

## 2013-06-17 DIAGNOSIS — F32A Depression, unspecified: Secondary | ICD-10-CM

## 2013-06-17 DIAGNOSIS — I10 Essential (primary) hypertension: Secondary | ICD-10-CM

## 2013-06-17 LAB — CBC WITH DIFFERENTIAL/PLATELET
Eosinophils Absolute: 0.1 10*3/uL (ref 0.0–0.7)
Lymphs Abs: 0.9 10*3/uL (ref 0.7–4.0)
MCH: 28.7 pg (ref 26.0–34.0)
Neutrophils Relative %: 77 % (ref 43–77)
Platelets: 101 10*3/uL — ABNORMAL LOW (ref 150–400)
RBC: 4.08 MIL/uL (ref 3.87–5.11)
WBC: 6 10*3/uL (ref 4.0–10.5)

## 2013-06-17 LAB — URINE MICROSCOPIC-ADD ON

## 2013-06-17 LAB — URINALYSIS, ROUTINE W REFLEX MICROSCOPIC
Glucose, UA: NEGATIVE mg/dL
Protein, ur: 30 mg/dL — AB
Urobilinogen, UA: 1 mg/dL (ref 0.0–1.0)

## 2013-06-17 LAB — TROPONIN I: Troponin I: <0.3 ng/mL (ref <0.30)

## 2013-06-17 LAB — BASIC METABOLIC PANEL
BUN: 68 mg/dL — ABNORMAL HIGH (ref 6–23)
GFR calc Af Amer: 16 mL/min — ABNORMAL LOW (ref >90)
GFR calc non Af Amer: 14 mL/min — ABNORMAL LOW (ref >90)
Potassium: 3.5 mEq/L (ref 3.5–5.1)
Sodium: 134 mEq/L — ABNORMAL LOW (ref 135–145)

## 2013-06-17 LAB — GLUCOSE, CAPILLARY

## 2013-06-17 MED ORDER — ONDANSETRON HCL 4 MG/2ML IJ SOLN
4.0000 mg | Freq: Four times a day (QID) | INTRAMUSCULAR | Status: DC | PRN
  Administered 2013-06-18: 4 mg via INTRAVENOUS

## 2013-06-17 MED ORDER — CARVEDILOL 25 MG PO TABS
25.0000 mg | ORAL_TABLET | Freq: Two times a day (BID) | ORAL | Status: DC
  Administered 2013-06-18 – 2013-06-19 (×3): 25 mg via ORAL
  Filled 2013-06-17 (×5): qty 1

## 2013-06-17 MED ORDER — FLUTICASONE PROPIONATE 50 MCG/ACT NA SUSP
1.0000 | Freq: Every day | NASAL | Status: DC
  Administered 2013-06-18 – 2013-06-19 (×2): 1 via NASAL
  Filled 2013-06-17: qty 16

## 2013-06-17 MED ORDER — DOCUSATE SODIUM 100 MG PO CAPS
100.0000 mg | ORAL_CAPSULE | Freq: Three times a day (TID) | ORAL | Status: DC
  Administered 2013-06-18 – 2013-06-19 (×5): 100 mg via ORAL
  Filled 2013-06-17 (×7): qty 1

## 2013-06-17 MED ORDER — PANTOPRAZOLE SODIUM 40 MG PO TBEC
40.0000 mg | DELAYED_RELEASE_TABLET | Freq: Every day | ORAL | Status: DC
  Administered 2013-06-18 – 2013-06-19 (×2): 40 mg via ORAL
  Filled 2013-06-17: qty 1

## 2013-06-17 MED ORDER — BUSPIRONE HCL 5 MG PO TABS
5.0000 mg | ORAL_TABLET | Freq: Two times a day (BID) | ORAL | Status: DC
  Administered 2013-06-18 – 2013-06-19 (×3): 5 mg via ORAL
  Filled 2013-06-17 (×4): qty 1

## 2013-06-17 MED ORDER — POLYETHYLENE GLYCOL 3350 17 G PO PACK
17.0000 g | PACK | Freq: Two times a day (BID) | ORAL | Status: DC
  Administered 2013-06-18 – 2013-06-19 (×2): 17 g via ORAL
  Filled 2013-06-17 (×4): qty 1

## 2013-06-17 MED ORDER — GUAIFENESIN ER 600 MG PO TB12
600.0000 mg | ORAL_TABLET | Freq: Two times a day (BID) | ORAL | Status: DC
  Administered 2013-06-18 – 2013-06-19 (×3): 600 mg via ORAL
  Filled 2013-06-17 (×4): qty 1

## 2013-06-17 MED ORDER — ONDANSETRON HCL 4 MG PO TABS: 4.0000 mg | ORAL_TABLET | Freq: Four times a day (QID) | ORAL | Status: DC | PRN

## 2013-06-17 MED ORDER — NITROGLYCERIN 0.4 MG SL SUBL: 0.4000 mg | SUBLINGUAL_TABLET | SUBLINGUAL | Status: DC | PRN

## 2013-06-17 MED ORDER — ACETAMINOPHEN 325 MG PO TABS: 650.0000 mg | ORAL_TABLET | Freq: Four times a day (QID) | ORAL | Status: DC | PRN

## 2013-06-17 MED ORDER — TORSEMIDE 20 MG PO TABS
80.0000 mg | ORAL_TABLET | Freq: Two times a day (BID) | ORAL | Status: DC
  Administered 2013-06-18 – 2013-06-19 (×4): 80 mg via ORAL
  Filled 2013-06-17 (×5): qty 4

## 2013-06-17 MED ORDER — HYDROCODONE-ACETAMINOPHEN 7.5-325 MG PO TABS
1.0000 | ORAL_TABLET | Freq: Every evening | ORAL | Status: DC | PRN
  Administered 2013-06-18: 1 via ORAL
  Filled 2013-06-17: qty 1

## 2013-06-17 MED ORDER — ALLOPURINOL 100 MG PO TABS
100.0000 mg | ORAL_TABLET | Freq: Every day | ORAL | Status: DC
  Administered 2013-06-18 – 2013-06-19 (×2): 100 mg via ORAL
  Filled 2013-06-17 (×2): qty 1

## 2013-06-17 MED ORDER — LEVOFLOXACIN IN D5W 750 MG/150ML IV SOLN
750.0000 mg | Freq: Once | INTRAVENOUS | Status: AC
  Administered 2013-06-17: 750 mg via INTRAVENOUS
  Filled 2013-06-17: qty 150

## 2013-06-17 MED ORDER — SODIUM CHLORIDE 0.9 % IJ SOLN
3.0000 mL | Freq: Two times a day (BID) | INTRAMUSCULAR | Status: DC
  Administered 2013-06-18 (×2): 3 mL via INTRAVENOUS

## 2013-06-17 MED ORDER — INSULIN GLARGINE 100 UNIT/ML ~~LOC~~ SOLN
5.0000 [IU] | Freq: Every day | SUBCUTANEOUS | Status: DC
  Administered 2013-06-18: 5 [IU] via SUBCUTANEOUS
  Filled 2013-06-17 (×2): qty 0.05

## 2013-06-17 MED ORDER — INSULIN ASPART 100 UNIT/ML ~~LOC~~ SOLN
0.0000 [IU] | Freq: Three times a day (TID) | SUBCUTANEOUS | Status: DC
  Administered 2013-06-18: 1 [IU] via SUBCUTANEOUS
  Administered 2013-06-18: 3 [IU] via SUBCUTANEOUS
  Administered 2013-06-19: 5 [IU] via SUBCUTANEOUS

## 2013-06-17 MED ORDER — ALPRAZOLAM 0.25 MG PO TABS
0.2500 mg | ORAL_TABLET | Freq: Three times a day (TID) | ORAL | Status: DC | PRN
  Administered 2013-06-18: 0.25 mg via ORAL
  Filled 2013-06-17: qty 1

## 2013-06-17 MED ORDER — ACETAMINOPHEN 650 MG RE SUPP: 650.0000 mg | Freq: Four times a day (QID) | RECTAL | Status: DC | PRN

## 2013-06-17 MED ORDER — AMIODARONE HCL 200 MG PO TABS
400.0000 mg | ORAL_TABLET | Freq: Every day | ORAL | Status: DC
  Administered 2013-06-18 – 2013-06-19 (×2): 400 mg via ORAL
  Filled 2013-06-17 (×2): qty 2

## 2013-06-17 MED ORDER — SODIUM CHLORIDE 0.9 % IJ SOLN
3.0000 mL | Freq: Two times a day (BID) | INTRAMUSCULAR | Status: DC
  Administered 2013-06-18: 3 mL via INTRAVENOUS

## 2013-06-17 MED ORDER — TRAZODONE HCL 50 MG PO TABS
50.0000 mg | ORAL_TABLET | Freq: Every day | ORAL | Status: DC
  Administered 2013-06-18 (×2): 50 mg via ORAL
  Filled 2013-06-17 (×3): qty 1

## 2013-06-17 MED ORDER — MELATONIN 3 MG PO CAPS: 1.0000 | ORAL_CAPSULE | Freq: Every day | ORAL | Status: DC

## 2013-06-17 NOTE — ED Notes (Signed)
Bedpan was successful.  Urine sent to lab.

## 2013-06-17 NOTE — H&P (Addendum)
[facility] History and Physical  Gina Ashley ZOX:[id number] DOB: Apr 02, 1927 DOA: 06/17/2013  Referring physician: ER physician. PCP: Kimber Relic, MD  Specialists: [facility].  Chief Complaint: Weakness.  HPI: Gina Ashley is a 79 y.o. female history of diastolic CHF with last EF measured was 5560% and March 2014, chronic kidney disease stage IV, diabetes mellitus type 2, OSA, atrial fibrillation presented to the ER because of weakness. Patient has been feeling weak for last 3-4 days. While waiting in the ER patient had mild chest discomfort which lasted for a few minutes and resolved spontaneously. The pain was in the left anterior chest wall. Denies any shortness of breath more than usual denies any productive cough fever chills nausea vomiting abdominal pain any focal deficits diarrhea. Patient had come with similar complaints 3 days ago and was diagnosed with pneumonia and UTI and discharged home on Levaquin. Despite taking which patient was still feeling weak and had come to the ER pain in the ER patient's labs were unremarkable except for chest x-ray showing persistent infiltrates concerning for pneumonia and UA showed features of UTI. Patient's recent urine culture grew Klebsiella pneumonia and reviewing patient's chart patient has been having Klebsiella pneumonia positive cultures multiple times. Klebsiella pneumoniae is resistant to multiple antibiotics. At this time it was felt maybe colonization. At this time patient has been admitted for further management.  Review of Systems: As presented in the history of presenting illness, rest negative.  Past Medical History  Diagnosis Date  . GERD (gastroesophageal reflux disease)   . Hiatal hernia   . HTN (hypertension)     all her life  . CKD (chronic kidney disease), stage III     GFR: 38  . Atrial fibrillation     since 1996  . Depression   . Chronic diastolic heart failure 08/31/2012  . Tibia fracture      BILATERAL  . Renal failure     acute on chronic   . Respiratory failure     acute on chronic hx of  requiring BIPAP  . COPD (chronic obstructive pulmonary disease)   . Metabolic encephalopathy     hx of   . Renal failure     acute on chronic with need for intermittent dialysis - hx of   . UTI (urinary tract infection)     hx of   . Pleural effusion     right sided now resolved   . CHF (congestive heart failure)     diastolic HF  . Complication of anesthesia     "hard to wake up" (03/11/2013)  . OSA on CPAP   . Type II diabetes mellitus 1980's?  . History of blood transfusion 08/2012    "related to leg OR" (03/11/2013)  . Arthritis     "left arm; lower back, hips, hands" (03/11/2013)  . Chronic lower back pain     "disc pops in and out" (03/11/2013)  . Gout   . Anxiety   . Fall 08/2012    "in nursing home" (03/11/2013)  . Anemia   . Chronic a-fib 03/12/2013   Past Surgical History  Procedure Laterality Date  . Bunionectomy Bilateral     bilateral  . Cholecystectomy    . Shoulder arthroscopy w/ rotator cuff repair Left 2002  . Total knee arthroplasty Bilateral 2001  . Hammer toe surgery Bilateral 2004  . Colonoscopy  07/11/2011    Procedure: COLONOSCOPY;  Surgeon: Malissa Hippo, MD;  Location: [facility];  Service:  Endoscopy;  Laterality: N/A;  3:00  . Orif periprosthetic fracture  09/08/2012    Procedure: OPEN REDUCTION INTERNAL FIXATION (ORIF) PERIPROSTHETIC FRACTURE;  Surgeon: Shelda Pal, MD;  Location: [facility];  Service: Orthopedics;  Laterality: Right;  ORIF periprosthetic right proximal femur fracture   . External fixation leg  09/08/2012    Procedure: EXTERNAL FIXATION LEG;  Surgeon: Shelda Pal, MD;  Location: [facility];  Service: Orthopedics;  Laterality: Left;  . Joint replacement    . External fixation leg  11/30/2012    Procedure: EXTERNAL FIXATION LEG;  Surgeon: Shelda Pal, MD;  Location: [facility];  Service: Orthopedics;  Laterality: Left;  Removal of External  Fixation Left Knee with Evaluation with Floroscopy  . Tee without cardioversion N/A 01/19/2013    Procedure: TRANSESOPHAGEAL ECHOCARDIOGRAM (TEE);  Surgeon: Lewayne Bunting, MD;  Location: [facility] ENDOSCOPY;  Service: Cardiovascular;  Laterality: N/A;  . Shoulder open rotator cuff repair Left 2002    "maybe 3 months after scope" (03/11/2013)  . Femur fracture surgery Right 08/2012    "fell at nursing home" (03/11/2013)  . Tibia fracture surgery Left 08/2012    "fell at nursing home" (03/11/2013)   Social History:  reports that she has never smoked. She has never used smokeless tobacco. She reports that she does not drink alcohol or use illicit drugs. Home. where does patient live-- Can do ADLs. Can patient participate in ADLs?  Allergies  Allergen Reactions  . Morphine Sulfate Other (See Comments)    REACTION: change in personality  . Remeron [Mirtazapine] Other (See Comments)    Altered mental status, lethargy  . Prescott Gum [Fish Allergy] Other (See Comments)    unknown  . Penicillins Rash    Tolerates Ancef.    Family History  Problem Relation Age of Onset  . Colon cancer Brother       Prior to Admission medications   Medication Sig Start Date End Date Taking? Authorizing Provider  acetaminophen (TYLENOL) 325 MG tablet Take 650 mg by mouth every 6 (six) hours as needed for pain.   Yes Historical Provider, MD  allopurinol (ZYLOPRIM) 100 MG tablet Take 100 mg by mouth daily.   Yes Historical Provider, MD  ALPRAZolam Prudy Feeler) 0.25 MG tablet Take one half tablet by mouth every 8 hours as needed for anxiety 04/06/13  Yes Kimber Relic, MD  amiodarone (PACERONE) 200 MG tablet Take 400 mg by mouth daily.   Yes Historical Provider, MD  busPIRone (BUSPAR) 5 MG tablet Take 5 mg by mouth every 12 (twelve) hours.    Yes Historical Provider, MD  carvedilol (COREG) 25 MG tablet Take 25 mg by mouth 2 (two) times daily with a meal.   Yes Historical Provider, MD  docusate sodium (COLACE) 100 MG capsule Take 100  mg by mouth every 8 (eight) hours.    Yes Historical Provider, MD  fluticasone (FLONASE) 50 MCG/ACT nasal spray Place 1 spray into the nose daily.   Yes Historical Provider, MD  guaiFENesin (MUCINEX) 600 MG 12 hr tablet Take 600 mg by mouth 2 (two) times daily.    Yes Historical Provider, MD  HYDROcodone-acetaminophen (NORCO) 7.5-325 MG per tablet Take 1 tablet by mouth at bedtime as needed for pain.   Yes Historical Provider, MD  insulin aspart (NOVOLOG) 100 UNIT/ML injection Inject 5 Units into the skin 3 (three) times daily with meals. If cbg is greater than 150 09/15/12  Yes Lesle Chris Black, NP  insulin glargine (LANTUS) 100 UNIT/ML  injection Inject 5 Units into the skin at bedtime.   Yes Historical Provider, MD  Melatonin 3 MG CAPS Take 1 capsule by mouth at bedtime.   Yes Historical Provider, MD  metolazone (ZAROXOLYN) 2.5 MG tablet Take 2.5 mg every Wednesday or if weight greater than 205 lbs 06/09/13  Yes Aundria Rud, NP  nitroGLYCERIN (NITROSTAT) 0.4 MG SL tablet Place 0.4 mg under the tongue every 5 (five) minutes as needed for chest pain.   Yes Historical Provider, MD  omeprazole (PRILOSEC) 20 MG capsule Take 40 mg by mouth daily.    Yes Historical Provider, MD  ondansetron (ZOFRAN) 4 MG tablet Take 1 tablet (4 mg total) by mouth every 6 (six) hours as needed for nausea. 02/12/13  Yes Shanker Levora Dredge, MD  polyethylene glycol (MIRALAX / GLYCOLAX) packet Take 17 g by mouth 2 (two) times daily.    Yes Historical Provider, MD  torsemide (DEMADEX) 20 MG tablet Take 4 tablets (80 mg total) by mouth 2 (two) times daily. 05/01/13  Yes Tiffany L Reed, DO  traZODone (DESYREL) 50 MG tablet Take 50 mg by mouth at bedtime.   Yes Historical Provider, MD  warfarin (COUMADIN) 5 MG tablet Take 2.5 mg by mouth daily.   Yes Historical Provider, MD  levofloxacin (LEVAQUIN) 750 MG tablet Take 1 tablet (750 mg total) by mouth daily. X 7 days 06/13/13   Richardean Canal, MD   Physical Exam: Filed Vitals:   06/17/13  1717 06/17/13 2032  BP: 117/52 142/90  Pulse: 83 91  Temp: 98.2 F (36.8 C)   TempSrc: Oral   Resp: 20 18  Height: 5\' 3"  (1.6 m)   Weight: 88.451 kg (195 lb)   SpO2: 90% 97%     General:  Well-developed and nourished.  Eyes: Anicteric no pallor.  ENT: No discharge from ears eyes nose mouth.  Neck: No mass felt.  Cardiovascular: S1-S2 heard.  Respiratory: No rhonchi or crepitations.  Abdomen: Soft nontender bowel sounds present.  Skin: No rash.  Musculoskeletal: No edema.  Psychiatric: Appears normal.  Neurologic: Alert awake oriented to time place and person. Moves all extremities.  Labs on Admission:  Basic Metabolic Panel:  Recent Labs Lab 06/13/13 1714 06/17/13 1741  NA 135 134*  K 4.1 3.5  CL 87* 85*  CO2 41* 38*  GLUCOSE 257* 229*  BUN 75* 68*  CREATININE 2.88* 2.87*  CALCIUM 9.8 9.8   Liver Function Tests: No results found for this basename: AST, ALT, ALKPHOS, BILITOT, PROT, ALBUMIN,  in the last 168 hours No results found for this basename: LIPASE, AMYLASE,  in the last 168 hours No results found for this basename: AMMONIA,  in the last 168 hours CBC:  Recent Labs Lab 06/13/13 1714 06/17/13 1741  WBC 5.1 6.0  NEUTROABS 3.8 4.7  HGB 11.2* 11.7*  HCT 34.6* 35.3*  MCV 88.7 86.5  PLT 107* 101*   Cardiac Enzymes:  Recent Labs Lab 06/17/13 1741  TROPONINI <0.30    BNP (last 3 results)  Recent Labs  03/19/13 2123 05/03/13 2223 06/13/13 1714  PROBNP 4611.0* 4188.0* 3655.0*   CBG: No results found for this basename: GLUCAP,  in the last 168 hours  Radiological Exams on Admission: Dg Chest 2 View  06/17/2013   *RADIOLOGY REPORT*  Clinical Data: Fatigue with shortness of breath and left-sided chest pain.  History hypertension.  CHEST - 2 VIEW  Comparison: 06/13/2013  Findings: Lateral view degraded by patient arm position.  Cardiomegaly with  atherosclerosis in the transverse aorta. Moderate right-sided pleural effusion which is not  significantly changed.  No left-sided pleural effusion. No pneumothorax.  Slight increase in mild interstitial edema.  No change in right base air space disease.  IMPRESSION: Cardiomegaly with slight increase in mild interstitial edema.  Right-sided pleural effusion with adjacent infection or atelectasis, unchanged.   Original Report Authenticated By: Jeronimo Greaves, M.D.    EKG: Independently reviewed. Atrial fibrillation rate controlled.  Assessment/Plan Principal Problem:   Weakness generalized Active Problems:   DM   CKD (chronic kidney disease)   Chronic diastolic heart failure   Chronic a-fib   Community acquired pneumonia   1. Generalized weakness - cause not clear. Check orthostatics, TSH, cortisol levels. Cycle cardiac markers. 2. Chest discomfort - EKG unremarkable. Cycle cardiac markers. 3. Pneumonia - patient has been placed on ceftriaxone and Zithromax. 4. Possible UTI - cultures are growing Klebsiella pneumonia resistant to multiple antibiotics. Patient's urine has been growing Klebsiella pneumonia multiple times. May consult infectious disease in a.m. for further recommendations. At this time it is felt that patient may be having colonization. 5. Chronic diastolic heart failure - presently looks compensated. Continue diuretics. Last EF measured was in March 2014 was 55-60%. 6. Chronic atrial fibrillation - rate controlled. Continue Coumadin. 7. Diabetes mellitus type 2 - closely follow CBGs. 8. Chronic kidney disease - creatinine appears to be at baseline. 9. Anemia - probably from kidney disease. Follow CBC 10. OSA on CPAP.    Code Status: DO NOT RESUSCITATE.  Family Communication: Patient's daughter the bedside.  Disposition Plan: Admit FOR OBSERVATION.   Annmarie Plemmons N. [facility] Pager [phone].  If 7PM-7AM, please contact night-coverage [email or web address] Password [facility] 06/17/2013, 8:57 PM

## 2013-06-17 NOTE — ED Notes (Signed)
Pt was placed on bedpan.  Pt stated she would like to attempt same before catheterized.

## 2013-06-17 NOTE — ED Notes (Signed)
Hospitalist at bedside 

## 2013-06-17 NOTE — ED Provider Notes (Signed)
CSN: [id number]     Arrival date & time 06/17/13  1620 History     First MD Initiated Contact with Patient 06/17/13 1840     Chief Complaint  Patient presents with  . Fatigue   (Consider location/radiation/quality/duration/timing/severity/associated sxs/prior Treatment) Patient is a 79 y.o. female presenting with weakness. The history is provided by the patient.  Weakness This is a new problem. Episode onset: ongoing for the past week approximately. The problem occurs constantly. The problem has been unchanged. Associated symptoms include fatigue and weakness. Pertinent negatives include no abdominal pain, chest pain, chills, congestion, coughing, diaphoresis, fever, headaches, nausea, neck pain, numbness, rash, sore throat or vomiting. The symptoms are aggravated by exertion. She has tried lying down and rest for the symptoms. The treatment provided no relief.    Past Medical History  Diagnosis Date  . GERD (gastroesophageal reflux disease)   . Hiatal hernia   . HTN (hypertension)     all her life  . CKD (chronic kidney disease), stage III     GFR: 38  . Atrial fibrillation     since 1996  . Depression   . Chronic diastolic heart failure 08/31/2012  . Tibia fracture     BILATERAL  . Renal failure     acute on chronic   . Respiratory failure     acute on chronic hx of  requiring BIPAP  . COPD (chronic obstructive pulmonary disease)   . Metabolic encephalopathy     hx of   . Renal failure     acute on chronic with need for intermittent dialysis - hx of   . UTI (urinary tract infection)     hx of   . Pleural effusion     right sided now resolved   . CHF (congestive heart failure)     diastolic HF  . Complication of anesthesia     "hard to wake up" (03/11/2013)  . OSA on CPAP   . Type II diabetes mellitus 1980's?  . History of blood transfusion 08/2012    "related to leg OR" (03/11/2013)  . Arthritis     "left arm; lower back, hips, hands" (03/11/2013)  . Chronic lower  back pain     "disc pops in and out" (03/11/2013)  . Gout   . Anxiety   . Fall 08/2012    "in nursing home" (03/11/2013)  . Anemia   . Chronic a-fib 03/12/2013   Past Surgical History  Procedure Laterality Date  . Bunionectomy Bilateral     bilateral  . Cholecystectomy    . Shoulder arthroscopy w/ rotator cuff repair Left 2002  . Total knee arthroplasty Bilateral 2001  . Hammer toe surgery Bilateral 2004  . Colonoscopy  07/11/2011    Procedure: COLONOSCOPY;  Surgeon: Malissa Hippo, MD;  Location: [facility];  Service: Endoscopy;  Laterality: N/A;  3:00  . Orif periprosthetic fracture  09/08/2012    Procedure: OPEN REDUCTION INTERNAL FIXATION (ORIF) PERIPROSTHETIC FRACTURE;  Surgeon: Shelda Pal, MD;  Location: [facility];  Service: Orthopedics;  Laterality: Right;  ORIF periprosthetic right proximal femur fracture   . External fixation leg  09/08/2012    Procedure: EXTERNAL FIXATION LEG;  Surgeon: Shelda Pal, MD;  Location: [facility];  Service: Orthopedics;  Laterality: Left;  . Joint replacement    . External fixation leg  11/30/2012    Procedure: EXTERNAL FIXATION LEG;  Surgeon: Shelda Pal, MD;  Location: [facility];  Service: Orthopedics;  Laterality:  Left;  Removal of External Fixation Left Knee with Evaluation with Floroscopy  . Tee without cardioversion N/A 01/19/2013    Procedure: TRANSESOPHAGEAL ECHOCARDIOGRAM (TEE);  Surgeon: Lewayne Bunting, MD;  Location: [facility] END[facility]COPY;  Service: Cardiovascular;  Laterality: N/A;  . Shoulder open rotator cuff repair Left 2002    "maybe 3 months after scope" (03/11/2013)  . Femur fracture surgery Right 08/2012    "fell at nursing home" (03/11/2013)  . Tibia fracture surgery Left 08/2012    "fell at nursing home" (03/11/2013)   Family History  Problem Relation Age of Onset  . Colon cancer Brother    History  Substance Use Topics  . Smoking status: Never Smoker   . Smokeless tobacco: Never Used  . Alcohol Use: No   OB History   Grav Para  Term Preterm Abortions TAB SAB Ect Mult Living                 Review of Systems  Constitutional: Positive for fatigue. Negative for fever, chills and diaphoresis.  HENT: Negative for ear pain, congestion, sore throat, facial swelling, mouth sores, trouble swallowing, neck pain and neck stiffness.   Eyes: Negative.   Respiratory: Negative for apnea, cough, chest tightness, shortness of breath and wheezing.   Cardiovascular: Negative for chest pain, palpitations and leg swelling.  Gastrointestinal: Negative for nausea, vomiting, abdominal pain, diarrhea and abdominal distention.  Genitourinary: Negative for hematuria, flank pain, vaginal discharge, difficulty urinating and menstrual problem.  Musculoskeletal: Negative for back pain and gait problem.  Skin: Negative for rash and wound.  Neurological: Positive for weakness. Negative for dizziness, tremors, seizures, syncope, facial asymmetry, numbness and headaches.  Psychiatric/Behavioral: The patient is nervous/anxious.   All other systems reviewed and are negative.    Allergies  Morphine sulfate; Remeron; Tuna; and Penicillins  Home Medications   No current outpatient prescriptions on file. BP 149/79  Pulse 79  Temp(Src) 98.9 F (37.2 C) (Oral)  Resp 18  Ht 5\' 3"  (1.6 m)  Wt 215 lb 6.2 oz (97.7 kg)  BMI 38.16 kg/m2  SpO2 100% Physical Exam  Nursing note and vitals reviewed. Constitutional: She is oriented to person, place, and time. She appears well-developed and well-nourished. No distress.  HENT:  Head: Normocephalic and atraumatic.  Right Ear: External ear normal.  Left Ear: External ear normal.  Nose: Nose normal.  Mouth/Throat: Oropharynx is clear and moist. No oropharyngeal exudate.  Eyes: Conjunctivae and EOM are normal. Pupils are equal, round, and reactive to light. Right eye exhibits no discharge. Left eye exhibits no discharge.  Neck: Normal range of motion. Neck supple. No JVD present. No tracheal deviation  present. No thyromegaly present.  Cardiovascular: Normal rate, regular rhythm, normal heart sounds and intact distal pulses.  Exam reveals no gallop and no friction rub.   No murmur heard. Pulmonary/Chest: Effort normal and breath sounds normal. No respiratory distress. She has no wheezes. She has no rales. She exhibits no tenderness.  Abdominal: Soft. Bowel sounds are normal. She exhibits no distension. There is no tenderness. There is no rebound and no guarding.  Musculoskeletal: Normal range of motion.  Lymphadenopathy:    She has no cervical adenopathy.  Neurological: She is alert and oriented to person, place, and time. No cranial nerve deficit or sensory deficit. Coordination normal.  Normal finger to nose and heel to shin  Skin: Skin is warm. No rash noted. She is not diaphoretic.  Psychiatric: She has a normal mood and affect. Her behavior is  normal. Judgment and thought content normal.    ED Course   Procedures (including critical care time)  Labs Reviewed  BASIC METABOLIC PANEL - Abnormal; Notable for the following:    Sodium 134 (*)    Chloride 85 (*)    CO2 38 (*)    Glucose, Bld 229 (*)    BUN 68 (*)    Creatinine, Ser 2.87 (*)    GFR calc non Af Amer 14 (*)    GFR calc Af Amer 16 (*)    All other components within normal limits  CBC WITH DIFFERENTIAL - Abnormal; Notable for the following:    Hemoglobin 11.7 (*)    HCT 35.3 (*)    Platelets 101 (*)    All other components within normal limits  URINALYSIS, ROUTINE W REFLEX MICROSCOPIC - Abnormal; Notable for the following:    APPearance CLOUDY (*)    Hgb urine dipstick SMALL (*)    Protein, ur 30 (*)    Leukocytes, UA LARGE (*)    All other components within normal limits  URINE MICROSCOPIC-ADD ON - Abnormal; Notable for the following:    Squamous Epithelial / LPF FEW (*)    Bacteria, UA MANY (*)    Casts HYALINE CASTS (*)    All other components within normal limits  GLUCOSE, CAPILLARY - Abnormal; Notable for  the following:    Glucose-Capillary 199 (*)    All other components within normal limits  URINE CULTURE  TROPONIN I  TROPONIN I  TROPONIN I  TROPONIN I  TSH  CORTISOL  BASIC METABOLIC PANEL  CBC  PROTIME-INR   Dg Chest 2 View  06/17/2013   *RADIOLOGY REPORT*  Clinical Data: Fatigue with shortness of breath and left-sided chest pain.  History hypertension.  CHEST - 2 VIEW  Comparison: 06/13/2013  Findings: Lateral view degraded by patient arm position.  Cardiomegaly with atherosclerosis in the transverse aorta. Moderate right-sided pleural effusion which is not significantly changed.  No left-sided pleural effusion. No pneumothorax.  Slight increase in mild interstitial edema.  No change in right base air space disease.  IMPRESSION: Cardiomegaly with slight increase in mild interstitial edema.  Right-sided pleural effusion with adjacent infection or atelectasis, unchanged.   Original Report Authenticated By: Jeronimo Greaves, M.D.   1. Chronic diastolic heart failure   2. CKD (chronic kidney disease)   3. Type II or unspecified type diabetes mellitus without mention of complication, not stated as uncontrolled   4. Weakness generalized     MDM  79 yr old F pt here with fatigue and weakness. Patient says that she continues to feel fatigued and dyspneic on exertion and has felt this way for the past week approximately. She was here approximately 4 days ago for the same compliant. At that time it was found that she likely had a PNA and UTI. She was given levaquin. She says someone called her and told her to stop taking the levaquin. She has not taken it for the past 2 days and she continues to feel fatigued. She does not complain of SOB, CP, HA, N/V/D, fevers or rash. She has no other complaints except for chronic hip pain which is unchanged and loss of appetite. Will screen her again for possible infection.  Patient still with possible PNA and UTI. Since patient failed outpatient treatment will  admit her to ensure that she continues to improve.  Case Discussed with Dr. Catarina Hartshorn, MD 06/18/13 [phone]

## 2013-06-17 NOTE — Progress Notes (Signed)
ANTICOAGULATION CONSULT NOTE - Initial Consult  Pharmacy Consult for coumadin-levaquin Indication: atrial fibrillation  Allergies  Allergen Reactions  . Morphine Sulfate Other (See Comments)    REACTION: change in personality  . Remeron [Mirtazapine] Other (See Comments)    Altered mental status, lethargy  . Prescott Gum [Fish Allergy] Other (See Comments)    unknown  . Penicillins Rash    Tolerates Ancef.    Patient Measurements: Height: 5\' 3"  (160 cm) Weight: 215 lb 6.2 oz (97.7 kg) IBW/kg (Calculated) : 52.4 Heparin Dosing Weight:   Vital Signs: Temp: 98.9 F (37.2 C) (08/14 2225) Temp src: Oral (08/14 2225) BP: 149/79 mmHg (08/14 2225) Pulse Rate: 79 (08/14 2225)  Labs:  Recent Labs  06/15/13 06/17/13 1741  HGB  --  11.7*  HCT  --  35.3*  PLT  --  101*  INR 2.1  --   CREATININE  --  2.87*  TROPONINI  --  <0.30    Estimated Creatinine Clearance: 16.2 ml/min (by C-G formula based on Cr of 2.87).   Medical History: Past Medical History  Diagnosis Date  . GERD (gastroesophageal reflux disease)   . Hiatal hernia   . HTN (hypertension)     all her life  . CKD (chronic kidney disease), stage III     GFR: 38  . Atrial fibrillation     since 1996  . Depression   . Chronic diastolic heart failure 08/31/2012  . Tibia fracture     BILATERAL  . Renal failure     acute on chronic   . Respiratory failure     acute on chronic hx of  requiring BIPAP  . COPD (chronic obstructive pulmonary disease)   . Metabolic encephalopathy     hx of   . Renal failure     acute on chronic with need for intermittent dialysis - hx of   . UTI (urinary tract infection)     hx of   . Pleural effusion     right sided now resolved   . CHF (congestive heart failure)     diastolic HF  . Complication of anesthesia     "hard to wake up" (03/11/2013)  . OSA on CPAP   . Type II diabetes mellitus 1980's?  . History of blood transfusion 08/2012    "related to leg OR" (03/11/2013)  .  Arthritis     "left arm; lower back, hips, hands" (03/11/2013)  . Chronic lower back pain     "disc pops in and out" (03/11/2013)  . Gout   . Anxiety   . Fall 08/2012    "in nursing home" (03/11/2013)  . Anemia   . Chronic a-fib 03/12/2013    Medications:  Prescriptions prior to admission  Medication Sig Dispense Refill  . acetaminophen (TYLENOL) 325 MG tablet Take 650 mg by mouth every 6 (six) hours as needed for pain.      Marland Kitchen allopurinol (ZYLOPRIM) 100 MG tablet Take 100 mg by mouth daily.      Marland Kitchen ALPRAZolam (XANAX) 0.25 MG tablet Take one half tablet by mouth every 8 hours as needed for anxiety  45 tablet  5  . amiodarone (PACERONE) 200 MG tablet Take 400 mg by mouth daily.      . busPIRone (BUSPAR) 5 MG tablet Take 5 mg by mouth every 12 (twelve) hours.       . carvedilol (COREG) 25 MG tablet Take 25 mg by mouth 2 (two) times daily with a meal.      .  docusate sodium (COLACE) 100 MG capsule Take 100 mg by mouth every 8 (eight) hours.       . fluticasone (FLONASE) 50 MCG/ACT nasal spray Place 1 spray into the nose daily.      Marland Kitchen guaiFENesin (MUCINEX) 600 MG 12 hr tablet Take 600 mg by mouth 2 (two) times daily.       Marland Kitchen HYDROcodone-acetaminophen (NORCO) 7.5-325 MG per tablet Take 1 tablet by mouth at bedtime as needed for pain.      Marland Kitchen insulin aspart (NOVOLOG) 100 UNIT/ML injection Inject 5 Units into the skin 3 (three) times daily with meals. If cbg is greater than 150      . insulin glargine (LANTUS) 100 UNIT/ML injection Inject 5 Units into the skin at bedtime.      . Melatonin 3 MG CAPS Take 1 capsule by mouth at bedtime.      . metolazone (ZAROXOLYN) 2.5 MG tablet Take 2.5 mg every Wednesday or if weight greater than 205 lbs  15 tablet  6  . nitroGLYCERIN (NITROSTAT) 0.4 MG SL tablet Place 0.4 mg under the tongue every 5 (five) minutes as needed for chest pain.      Marland Kitchen omeprazole (PRILOSEC) 20 MG capsule Take 40 mg by mouth daily.       . ondansetron (ZOFRAN) 4 MG tablet Take 1 tablet (4 mg  total) by mouth every 6 (six) hours as needed for nausea.  20 tablet    . polyethylene glycol (MIRALAX / GLYCOLAX) packet Take 17 g by mouth 2 (two) times daily.       Marland Kitchen torsemide (DEMADEX) 20 MG tablet Take 4 tablets (80 mg total) by mouth 2 (two) times daily.  60 tablet  0  . traZODone (DESYREL) 50 MG tablet Take 50 mg by mouth at bedtime.      Marland Kitchen warfarin (COUMADIN) 5 MG tablet Take 2.5 mg by mouth daily.      Marland Kitchen levofloxacin (LEVAQUIN) 750 MG tablet Take 1 tablet (750 mg total) by mouth daily. X 7 days  7 tablet  0    Assessment: 79 yo with progressive weakness and DOE- was on levaquin last week from ed admission but antibiotic stewardship program and review of past urine cultures shows colonization of klebsiella resistant to essentially all abx. Thus we are changing her to ctx and zithromax for pna only as uti is untreatable with common abx. Coumadin to continue per home dose pending baseline inr pending with am labs. Will write dose at that time. Goal of Therapy:  INR 2-3 Monitor platelets by anticoagulation protocol: Yes   Plan:  Rocephin 1gm q24h and zithromax 500 iv q24h  F/u pending INR and write coumadin dose ---home dose=2.5 mg daily   Janice Coffin 06/17/2013,11:53 PM

## 2013-06-17 NOTE — Progress Notes (Signed)
PHARMACIST - PHYSICIAN ORDER COMMUNICATION  CONCERNING: P&T Medication Policy on Herbal Medications  DESCRIPTION:  This patient's order for:  Melatonin  has been noted.  This product(s) is classified as an "herbal" or natural product. Due to a lack of definitive safety studies or FDA approval, nonstandard manufacturing practices, plus the potential risk of unknown drug-drug interactions while on inpatient medications, the Pharmacy and Therapeutics Committee does not permit the use of "herbal" or natural products of this type within [facility].   ACTION TAKEN: The pharmacy department is unable to verify this order at this time. Please reevaluate patient's clinical condition at discharge and address if the herbal or natural product(s) should be resumed at that time.   Wilfred Lacy, PharmD Clinical Pharmacist [phone] 06/17/2013, 11:48 PM

## 2013-06-17 NOTE — ED Notes (Signed)
Pt was tx for pnx and uti at [facility] on Sunday.  She was called yesterday to stop taking her antibiotics b/c the chest infection was "localized" per pt.  Pt states she came today b/c she continues to be exhausted, but she cannot sleep and she does not know why.  Sats are usually upper 90's on 2 L drip to 88 when she speaks.

## 2013-06-18 ENCOUNTER — Encounter (HOSPITAL_COMMUNITY): Payer: Self-pay | Admitting: *Deleted

## 2013-06-18 ENCOUNTER — Inpatient Hospital Stay (HOSPITAL_COMMUNITY): Payer: Medicare Other

## 2013-06-18 DIAGNOSIS — R627 Adult failure to thrive: Secondary | ICD-10-CM

## 2013-06-18 DIAGNOSIS — Z1630 Resistance to unspecified antimicrobial drugs: Secondary | ICD-10-CM

## 2013-06-18 DIAGNOSIS — J90 Pleural effusion, not elsewhere classified: Secondary | ICD-10-CM

## 2013-06-18 DIAGNOSIS — J150 Pneumonia due to Klebsiella pneumoniae: Secondary | ICD-10-CM

## 2013-06-18 LAB — GLUCOSE, CAPILLARY
Glucose-Capillary: 207 mg/dL — ABNORMAL HIGH (ref 70–99)
Glucose-Capillary: 209 mg/dL — ABNORMAL HIGH (ref 70–99)

## 2013-06-18 LAB — BASIC METABOLIC PANEL
CO2: 41 mEq/L (ref 19–32)
Chloride: 89 mEq/L — ABNORMAL LOW (ref 96–112)
Creatinine, Ser: 2.73 mg/dL — ABNORMAL HIGH (ref 0.50–1.10)
Sodium: 140 mEq/L (ref 135–145)

## 2013-06-18 LAB — CBC
HCT: 33 % — ABNORMAL LOW (ref 36.0–46.0)
MCV: 87.8 fL (ref 78.0–100.0)
RBC: 3.76 MIL/uL — ABNORMAL LOW (ref 3.87–5.11)
WBC: 5.6 10*3/uL (ref 4.0–10.5)

## 2013-06-18 LAB — CORTISOL: Cortisol, Plasma: 20.2 ug/dL

## 2013-06-18 LAB — PROTIME-INR: Prothrombin Time: 25.5 seconds — ABNORMAL HIGH (ref 11.6–15.2)

## 2013-06-18 LAB — TROPONIN I: Troponin I: <0.3 ng/mL (ref <0.30)

## 2013-06-18 LAB — RPR: RPR Ser Ql: NONREACTIVE

## 2013-06-18 MED ORDER — WARFARIN SODIUM 2.5 MG PO TABS
2.5000 mg | ORAL_TABLET | Freq: Once | ORAL | Status: AC
  Administered 2013-06-18: 2.5 mg via ORAL
  Filled 2013-06-18: qty 1

## 2013-06-18 MED ORDER — FOSFOMYCIN TROMETHAMINE 3 G PO PACK
3.0000 g | PACK | Freq: Once | ORAL | Status: AC
  Administered 2013-06-18: 3 g via ORAL
  Filled 2013-06-18: qty 3

## 2013-06-18 MED ORDER — CLINDAMYCIN PHOSPHATE 600 MG/50ML IV SOLN
600.0000 mg | Freq: Three times a day (TID) | INTRAVENOUS | Status: DC
  Filled 2013-06-18 (×3): qty 50

## 2013-06-18 MED ORDER — POTASSIUM CHLORIDE CRYS ER 20 MEQ PO TBCR
40.0000 meq | EXTENDED_RELEASE_TABLET | Freq: Once | ORAL | Status: AC
  Administered 2013-06-18: 40 meq via ORAL
  Filled 2013-06-18: qty 2

## 2013-06-18 MED ORDER — HYDROCODONE-ACETAMINOPHEN 7.5-325 MG PO TABS
1.0000 | ORAL_TABLET | Freq: Every evening | ORAL | Status: DC | PRN
  Administered 2013-06-18: 1 via ORAL
  Filled 2013-06-18: qty 1

## 2013-06-18 MED ORDER — ALPRAZOLAM 0.25 MG PO TABS: 0.2500 mg | ORAL_TABLET | Freq: Three times a day (TID) | ORAL | Status: DC | PRN

## 2013-06-18 MED ORDER — ACETAMINOPHEN 650 MG RE SUPP: 650.0000 mg | Freq: Four times a day (QID) | RECTAL | Status: DC | PRN

## 2013-06-18 MED ORDER — DEXTROSE 5 % IV SOLN
1.0000 g | INTRAVENOUS | Status: DC
  Administered 2013-06-18: 1 g via INTRAVENOUS
  Filled 2013-06-18: qty 10

## 2013-06-18 MED ORDER — INSULIN ASPART 100 UNIT/ML ~~LOC~~ SOLN: 0.0000 [IU] | Freq: Three times a day (TID) | SUBCUTANEOUS | Status: DC

## 2013-06-18 MED ORDER — NITROGLYCERIN 0.4 MG SL SUBL: 0.4000 mg | SUBLINGUAL_TABLET | SUBLINGUAL | Status: DC | PRN

## 2013-06-18 MED ORDER — PANTOPRAZOLE SODIUM 40 MG PO TBEC
DELAYED_RELEASE_TABLET | ORAL | Status: AC
  Filled 2013-06-18: qty 1

## 2013-06-18 MED ORDER — GLUCERNA SHAKE PO LIQD: 237.0000 mL | ORAL | Status: DC

## 2013-06-18 MED ORDER — DEXTROSE 5 % IV SOLN
500.0000 mg | INTRAVENOUS | Status: DC
  Administered 2013-06-18: 500 mg via INTRAVENOUS
  Filled 2013-06-18: qty 500

## 2013-06-18 MED ORDER — ONDANSETRON HCL 4 MG PO TABS: 4.0000 mg | ORAL_TABLET | Freq: Four times a day (QID) | ORAL | Status: DC | PRN

## 2013-06-18 MED ORDER — DOXYCYCLINE HYCLATE 100 MG IV SOLR
100.0000 mg | Freq: Two times a day (BID) | INTRAVENOUS | Status: DC
  Administered 2013-06-18: 100 mg via INTRAVENOUS
  Filled 2013-06-18 (×2): qty 100

## 2013-06-18 MED ORDER — ONDANSETRON HCL 4 MG/2ML IJ SOLN
INTRAMUSCULAR | Status: AC
  Filled 2013-06-18: qty 2

## 2013-06-18 MED ORDER — INSULIN ASPART 100 UNIT/ML ~~LOC~~ SOLN
3.0000 [IU] | Freq: Three times a day (TID) | SUBCUTANEOUS | Status: DC
  Administered 2013-06-19 (×2): 3 [IU] via SUBCUTANEOUS

## 2013-06-18 MED ORDER — WARFARIN - PHARMACIST DOSING INPATIENT: Freq: Every day | Status: DC

## 2013-06-18 MED ORDER — ACETAMINOPHEN 325 MG PO TABS
650.0000 mg | ORAL_TABLET | Freq: Four times a day (QID) | ORAL | Status: DC | PRN
  Administered 2013-06-18: 650 mg via ORAL
  Filled 2013-06-18: qty 2

## 2013-06-18 MED ORDER — ONDANSETRON HCL 4 MG/2ML IJ SOLN: 4.0000 mg | Freq: Four times a day (QID) | INTRAMUSCULAR | Status: DC | PRN

## 2013-06-18 NOTE — Evaluation (Signed)
Clinical/Bedside Swallow Evaluation Patient Details  Name: Gina Ashley MRN: [id number] Date of Birth: February 17, 1927  Today's Date: 06/18/2013 Time: 1023-1048 SLP Time Calculation (min): 25 min  Past Medical History:  Past Medical History  Diagnosis Date  . GERD (gastroesophageal reflux disease)   . Hiatal hernia   . HTN (hypertension)     all her life  . CKD (chronic kidney disease), stage III     GFR: 38  . Atrial fibrillation     since 1996  . Depression   . Chronic diastolic heart failure 08/31/2012  . Tibia fracture     BILATERAL  . Renal failure     acute on chronic   . Respiratory failure     acute on chronic hx of  requiring BIPAP  . COPD (chronic obstructive pulmonary disease)   . Metabolic encephalopathy     hx of   . Renal failure     acute on chronic with need for intermittent dialysis - hx of   . UTI (urinary tract infection)     hx of   . Pleural effusion     right sided now resolved   . CHF (congestive heart failure)     diastolic HF  . Complication of anesthesia     "hard to wake up" (03/11/2013)  . OSA on CPAP   . Type II diabetes mellitus 1980's?  . History of blood transfusion 08/2012    "related to leg OR" (03/11/2013)  . Arthritis     "left arm; lower back, hips, hands" (03/11/2013)  . Chronic lower back pain     "disc pops in and out" (03/11/2013)  . Gout   . Anxiety   . Fall 08/2012    "in nursing home" (03/11/2013)  . Anemia   . Chronic a-fib 03/12/2013   Past Surgical History:  Past Surgical History  Procedure Laterality Date  . Bunionectomy Bilateral     bilateral  . Cholecystectomy    . Shoulder arthroscopy w/ rotator cuff repair Left 2002  . Total knee arthroplasty Bilateral 2001  . Hammer toe surgery Bilateral 2004  . Colonoscopy  07/11/2011    Procedure: COLONOSCOPY;  Surgeon: Malissa Hippo, MD;  Location: [facility];  Service: Endoscopy;  Laterality: N/A;  3:00  . Orif periprosthetic fracture  09/08/2012    Procedure: OPEN REDUCTION  INTERNAL FIXATION (ORIF) PERIPROSTHETIC FRACTURE;  Surgeon: Shelda Pal, MD;  Location: [facility] ORS;  Service: Orthopedics;  Laterality: Right;  ORIF periprosthetic right proximal femur fracture   . External fixation leg  09/08/2012    Procedure: EXTERNAL FIXATION LEG;  Surgeon: Shelda Pal, MD;  Location: [facility] ORS;  Service: Orthopedics;  Laterality: Left;  . Joint replacement    . External fixation leg  11/30/2012    Procedure: EXTERNAL FIXATION LEG;  Surgeon: Shelda Pal, MD;  Location: [facility] ORS;  Service: Orthopedics;  Laterality: Left;  Removal of External Fixation Left Knee with Evaluation with Floroscopy  . Tee without cardioversion N/A 01/19/2013    Procedure: TRANSESOPHAGEAL ECHOCARDIOGRAM (TEE);  Surgeon: Lewayne Bunting, MD;  Location: [facility] ENDOSCOPY;  Service: Cardiovascular;  Laterality: N/A;  . Shoulder open rotator cuff repair Left 2002    "maybe 3 months after scope" (03/11/2013)  . Femur fracture surgery Right 08/2012    "fell at nursing home" (03/11/2013)  . Tibia fracture surgery Left 08/2012    "fell at nursing home" (03/11/2013)   HPI:  Gina Ashley is a 79 y.o. female history  of diastolic CHF with last EF measured was 5560% and March 2014, chronic kidney disease stage IV, diabetes mellitus type 2, OSA, atrial fibrillation presented to the ER because of weakness. Patient has been feeling weak for last 3-4 days. While waiting in the ER patient had mild chest discomfort which lasted for a few minutes and resolved spontaneously. The pain was in the left anterior chest wall. Denies any shortness of breath more than usual denies any productive cough fever chills nausea vomiting abdominal pain any focal deficits diarrhea. Patient had come with similar complaints 3 days ago and was diagnosed with pneumonia and UTI and discharged home on Levaquin. Despite taking which patient was still feeling weak and had come to the ER pain in the ER patient's labs were unremarkable except for chest x-ray  showing persistent infiltrates concerning for pneumonia and UA showed features of UTI. Patient's recent urine culture grew Klebsiella pneumonia and reviewing patient's chart patient has been having Klebsiella pneumonia positive cultures multiple times. Klebsiella pneumoniae is resistant to multiple antibiotics. At this time it was felt maybe colonization. At this time patient has been admitted for further management.   Assessment / Plan / Recommendation Clinical Impression  Pt with no observable signs of aspiration at bedside. Tolerates regular diet and thin liquids well. Does need to take pills in applesauce to facilitate transit. PA requests SLP 'rule out aspiration.' Only objective testing (MBS) will more reliably ensure aspiration is not the cause of pna and even then pt may aspirate in some siutations and not others. If MBS is desired to evaluate pt, please order. Will page PA and await orders if desired.     Aspiration Risk  Mild    Diet Recommendation Regular;Thin liquid   Liquid Administration via: Cup;Straw Medication Administration: Whole meds with puree Supervision: Patient able to self feed Postural Changes and/or Swallow Maneuvers: Seated upright 90 degrees    Other  Recommendations Recommended Consults: MBS Oral Care Recommendations: Oral care BID   Follow Up Recommendations  None    Frequency and Duration        Pertinent Vitals/Pain NA    SLP Swallow Goals     Swallow Study Prior Functional Status       General HPI: Gina Ashley is a 79 y.o. female history of diastolic CHF with last EF measured was 5560% and March 2014, chronic kidney disease stage IV, diabetes mellitus type 2, OSA, atrial fibrillation presented to the ER because of weakness. Patient has been feeling weak for last 3-4 days. While waiting in the ER patient had mild chest discomfort which lasted for a few minutes and resolved spontaneously. The pain was in the left anterior chest wall. Denies any  shortness of breath more than usual denies any productive cough fever chills nausea vomiting abdominal pain any focal deficits diarrhea. Patient had come with similar complaints 3 days ago and was diagnosed with pneumonia and UTI and discharged home on Levaquin. Despite taking which patient was still feeling weak and had come to the ER pain in the ER patient's labs were unremarkable except for chest x-ray showing persistent infiltrates concerning for pneumonia and UA showed features of UTI. Patient's recent urine culture grew Klebsiella pneumonia and reviewing patient's chart patient has been having Klebsiella pneumonia positive cultures multiple times. Klebsiella pneumoniae is resistant to multiple antibiotics. At this time it was felt maybe colonization. At this time patient has been admitted for further management. Type of Study: Bedside swallow evaluation Diet Prior to this Study:  Regular;Thin liquids Temperature Spikes Noted: No Respiratory Status: Room air History of Recent Intubation: No Behavior/Cognition: Alert;Cooperative;Pleasant mood Oral Cavity - Dentition: Adequate natural dentition Self-Feeding Abilities: Able to feed self Patient Positioning: Upright in bed Baseline Vocal Quality: Clear Volitional Cough: Strong Volitional Swallow: Able to elicit    Oral/Motor/Sensory Function Overall Oral Motor/Sensory Function: Appears within functional limits for tasks assessed   Ice Chips     Thin Liquid Thin Liquid: Within functional limits    Nectar Thick Nectar Thick Liquid: Not tested   Honey Thick Honey Thick Liquid: Not tested   Puree Puree: Within functional limits   Solid   GO    Solid: Within functional limits      [facility] MA CCC-SLP [phone]  Thurmond Hildebran, Riley Nearing 06/18/2013,10:58 AM

## 2013-06-18 NOTE — Progress Notes (Signed)
[facility] PROGRESS NOTE  Gina Ashley EAV:[id number] DOB: 07-Mar-1927 DOA: 06/17/2013 PCP: Kimber Relic, MD   Gina Ashley is a 79 y.o. Female with a history of diastolic CHF (EF measured was 78-29% March 2014), chronic kidney disease stage IV, diabetes mellitus type 2, OSA, atrial fibrillation presented to the ER because of weakness. Patient has been feeling weak for last 3-4 days. While waiting in the ER patient had mild chest discomfort which lasted for a few minutes and resolved spontaneously. The pain was in the left anterior chest wall. Denies any shortness of breath more than usual denies any productive cough fever chills nausea vomiting abdominal pain any focal deficits diarrhea. Patient had come with similar complaints 3 days ago and was diagnosed with pneumonia and UTI and discharged home on Levaquin. Despite taking the levaquin, the patient was still feeling weak. In the ER: chest x-ray showed persistent infiltrates concerning for pneumonia and UA showed features of UTI. Patient's recent urine culture grew Klebsiella pneumonia and reviewing patient's chart patient has been having Klebsiella pneumonia positive cultures multiple times. Klebsiella pneumoniae is resistant to multiple antibiotics. . At this time patient has been admitted for further management.  Assessment/Plan:  Chest Discomfort.  EKG unremarkable  Troponin within normal limits   Generalized weakness  Uncertain etiology - ? related to infection (UTI/PNA) Orthostatic vital signs are normal  TSH is low normal. Will check free T4.  Cortisol is WNL  PT consult  Will also check B12/RPR.  UTI  Klebsiella pneumoniae - multi drug resistant  Given a dose of fosfomycin. ID consulted for assistance with antibiotic coverage.   ?PNA -She has had a chronic right sided pleural effusion with ?PNA vs ATX. -Doesn't seem to be behaving like an acute PNA symptom-wise. -Will order a right lat decub xray. If effusion layers  may benefit from a thoracentesis for fluid analysis.   CHF - D  BNP elevated but appears compensated.  Saline Lock  Daily weight  On home dose of demadex.  At home takes Zaroxolyn 2.5 mg 1 dose for weight greater than 205. (not ordered here)   Dysphagia to meat and pills, with significant belching. Barium swallow is negative for stricture. + for hiatal hernia.  Currently on full liquids due to difficulty swallowing. Advance to D3 as tolerated.  BID protonix  DM  CBGs 140 - 200  On SSI - Sensitive with meal coverage and lantus (5 units)   Afib  Warfarin per pharmacy   CKD Stage IV Creatinine at baseline (2.6 - 2.8)   Anemia  Normocytic  Likely chronic  Stable for outpatient follow up.   OSA  Chloride slightly elevated. Likely chronic.  QHS CPAP ordered.   Hypokalemia -Replete PO. -Check Mag.   DVT Prophylaxis:  Coumadin per pharmacy  Code Status: DNR Family Communication: Disposition Plan: Inpatient   Consultants:  Infectious Disease  Procedures:  Barium swallow  Antibiotics:  Doxycycline subject to change per ID  HPI/Subjective: Patient reports pain in her throat from Kdur pill.  States she often has trouble swallowing her meds.  Objective: Filed Vitals:   06/17/13 2225 06/18/13 0514 06/18/13 0519 06/18/13 0524  BP: 149/79 133/83 127/83 143/85  Pulse: 79 82 82 114  Temp: 98.9 F (37.2 C) 98.3 F (36.8 C)    TempSrc: Oral Oral    Resp: 18 18    Height: 5\' 3"  (1.6 m)     Weight: 97.7 kg (215 lb 6.2 oz) 97.2 kg (214 lb 4.6 oz)  SpO2: 100% 96%     No intake or output data in the 24 hours ending 06/18/13 1551 Filed Weights   06/17/13 1717 06/17/13 2225 06/18/13 0514  Weight: 88.451 kg (195 lb) 97.7 kg (215 lb 6.2 oz) 97.2 kg (214 lb 4.6 oz)    Exam:   General:  Weak Appearing, Wd AA female sitting in bed.  Cardiovascular: irreg rate and rhythm, no murmurs, rubs or gallops, Respiratory: CTA, no wheeze, crackles, or rales.  No increased  work of breathing.  Abdomen: Soft, non-tender, non-distended, + bowel sounds, no masses  Musculoskeletal: Able to move all 4 extremities, 5/5 strength in each.  Mild bilateral lower extremity edema Left greater than Right.  Data Reviewed: Basic Metabolic Panel:  Recent Labs Lab 06/13/13 1714 06/17/13 1741 06/18/13 0645  NA 135 134* 140  K 4.1 3.5 3.0*  CL 87* 85* 89*  CO2 41* 38* 41*  GLUCOSE 257* 229* 176*  BUN 75* 68* 64*  CREATININE 2.88* 2.87* 2.73*  CALCIUM 9.8 9.8 9.7   CBC:  Recent Labs Lab 06/13/13 1714 06/17/13 1741 06/18/13 0645  WBC 5.1 6.0 5.6  NEUTROABS 3.8 4.7  --   HGB 11.2* 11.7* 10.5*  HCT 34.6* 35.3* 33.0*  MCV 88.7 86.5 87.8  PLT 107* 101* 96*   Cardiac Enzymes:  Recent Labs Lab 06/17/13 1741 06/17/13 2358 06/18/13 0645 06/18/13 1112  TROPONINI <0.30 <0.30 <0.30 <0.30   BNP (last 3 results)  Recent Labs  03/19/13 2123 05/03/13 2223 06/13/13 1714  PROBNP 4611.0* 4188.0* 3655.0*   CBG:  Recent Labs Lab 06/17/13 2331 06/18/13 0742  GLUCAP 199* 159*    Recent Results (from the past 240 hour(s))  URINE CULTURE     Status: None   Collection Time    06/13/13  5:35 PM      Result Value Range Status   Specimen Description URINE, RANDOM   Final   Special Requests NONE   Final   Culture  Setup Time     Final   Value: 06/14/2013 02:53     Performed at [facility] Count     Final   Value: >=100,000 COLONIES/ML     Performed at [facility]   Culture     Final   Value: KLEBSIELLA PNEUMONIAE     Note: THIS ISOLATE HAS PREVIOUSLY BEEN CONFIRMED AS A [facility] CARBAPENEMASE PRODUCER CRITICAL RESULT CALLED TO, READ BACK BY AND VERIFIED WITH: JANET GREENE@2 :45PM ON 06/15/13 BY DANTS     Performed at [facility]   Report Status 06/16/2013 FINAL   Final   Organism ID, Bacteria KLEBSIELLA PNEUMONIAE   Final     Studies: Dg Chest 2 View  06/17/2013   *RADIOLOGY REPORT*  Clinical Data: Fatigue with  shortness of breath and left-sided chest pain.  History hypertension.  CHEST - 2 VIEW  Comparison: 06/13/2013  Findings: Lateral view degraded by patient arm position.  Cardiomegaly with atherosclerosis in the transverse aorta. Moderate right-sided pleural effusion which is not significantly changed.  No left-sided pleural effusion. No pneumothorax.  Slight increase in mild interstitial edema.  No change in right base air space disease.  IMPRESSION: Cardiomegaly with slight increase in mild interstitial edema.  Right-sided pleural effusion with adjacent infection or atelectasis, unchanged.   Original Report Authenticated By: Jeronimo Greaves, M.D.   Dg Esophagus  06/18/2013   CLINICAL DATA:  Dysphagia. Patient reports that food and pills sticking in the esophagus.  EXAM: ESOPHOGRAM/BARIUM SWALLOW  TECHNIQUE: Single contrast examination performed using thin barium liquid given the patient's limited mobility.  COMPARISON:  Chest CT 03/21/2013.  FLUOROSCOPY TIME:  Twenty-eight seconds.  FINDINGS: Study was performed in the supine semi-erect position. Mucosal assessment is limited by the patient's limited mobility and inability to obtain air contrast views. The primary stripping wave is fairly well maintained. There is no evidence of stricture, mass or ulceration. There is a small hiatal hernia. A barium tablet passed without delay into the stomach. No significant reflux was elicited with the water siphon test.  IMPRESSION: Small hiatal hernia. No evidence of esophageal stricture or ulceration.   Electronically Signed   By: Roxy Horseman   On: 06/18/2013 13:01    Scheduled Meds: . allopurinol  100 mg Oral Daily  . amiodarone  400 mg Oral Daily  . busPIRone  5 mg Oral Q12H  . carvedilol  25 mg Oral BID WC  . clindamycin (CLEOCIN) IV  600 mg Intravenous Q8H  . docusate sodium  100 mg Oral Q8H  . doxycycline (VIBRAMYCIN) IV  100 mg Intravenous Q12H  . feeding supplement  237 mL Oral Q24H  . fluticasone  1 spray  Each Nare Daily  . guaiFENesin  600 mg Oral BID  . insulin aspart  0-9 Units Subcutaneous TID WC  . insulin aspart  3 Units Subcutaneous TID WC  . insulin glargine  5 Units Subcutaneous QHS  . ondansetron      . pantoprazole      . pantoprazole  40 mg Oral Daily  . polyethylene glycol  17 g Oral BID  . sodium chloride  3 mL Intravenous Q12H  . sodium chloride  3 mL Intravenous Q12H  . torsemide  80 mg Oral BID  . traZODone  50 mg Oral QHS  . warfarin  2.5 mg Oral ONCE-1800  . Warfarin - Pharmacist Dosing Inpatient   Does not apply q1800   Continuous Infusions:   Principal Problem:   Weakness generalized Active Problems:   DM   CKD (chronic kidney disease)   Chronic diastolic heart failure   Chronic a-fib   Community acquired pneumonia    Stephani Police  [facility] Pager [phone]. If 7PM-7AM, please contact night-coverage at [email or web address], password [facility] 06/18/2013, 3:51 PM  LOS: 1 day     Patient seen and examined. Agree with above note.  Peggye Pitt, MD [facility] Pager: [phone]

## 2013-06-18 NOTE — Care Management Note (Unsigned)
    Page 1 of 1   06/18/2013     4:47:20 PM   CARE MANAGEMENT NOTE 06/18/2013  Patient:  [facility]   Account Number:  [id number]  Date Initiated:  06/18/2013  Documentation initiated by:  Letha Cape  Subjective/Objective Assessment:   dx weakness, pl effusions, chf , afib  admit- lives with daughtere     Action/Plan:   pt eval   Anticipated DC Date:  06/21/2013   Anticipated DC Plan:  HOME W HOME HEALTH SERVICES      DC Planning Services  CM consult      Choice offered to / List presented to:             Status of service:  In process, will continue to follow Medicare Important Message given?   (If response is "NO", the following Medicare IM given date fields will be blank) Date Medicare IM given:   Date Additional Medicare IM given:    Discharge Disposition:    Per UR Regulation:  Reviewed for med. necessity/level of care/duration of stay  If discussed at Long Length of Stay Meetings, dates discussed:    Comments:  06/18/13 16:46 Letha Cape RN, BSN [phone] patient lives with daughter, await pt eval.  NCM will continue to follow for dc needs.

## 2013-06-18 NOTE — Progress Notes (Signed)
Pt has refused CPAP machine for tonight. Pt says she sometimes wears a machine at home but it has not been working well. She wants to wait on trying our machines here. Pt is currently on [place] in no distress, resting well. RT will continue to assist as needed.

## 2013-06-18 NOTE — Consult Note (Signed)
INFECTIOUS DISEASE ATTENDING ADDENDUM:     [facility]   Date: 06/18/2013  Patient name: Gina Ashley  Medical record number: [id number]  Date of birth: [birth year]    This patient has been seen and discussed with the house staff. Please see their note for complete details. I concur with their findings with the following additions/corrections:  CRE  UTI: --give Fosfomycin today and in 3 days  Pleural effusion with ? Infiltrate: when last tapped seemed transudative but is larger --would check lateral decub film  --would ask IR to perform diagnostic and therapeutic thoracocentesis and send pleural fluid for  LDH Protein Cell count and diff Glucose  Bacterial culture AFB culture Fungal culture  Check serum LDH and protein with am labs  Hold of on treating for PNA  --screening check HIV and hep C  Dr. Luciana Axe is covering this weekend.   Acey Lav 06/18/2013, 6:47 PM

## 2013-06-18 NOTE — Progress Notes (Signed)
Utilization review completed. Alayasia Breeding, RN, BSN. 

## 2013-06-18 NOTE — Progress Notes (Signed)
NURSING PROGRESS NOTE  Nandini Bogdanski [id number] Admission Data: 06/18/2013 2:37 AM Attending Provider: Eduard Clos, MD GEX:BMWUX, Lenon Curt, MD Code Status:full  Santita Hunsberger is a 79 y.o. female patient admitted from ED:  -No acute distress noted.  -No complaints of shortness of breath.  -No complaints of chest pain.   Cardiac Monitoring: Box # TX 21 in place. Cardiac monitor yields:atrial fibrillation, with ventricular rate of 83.  Blood pressure 149/79, pulse 79, temperature 98.9 F (37.2 C), temperature source Oral, resp. rate 18, height 5\' 3"  (1.6 m), weight 97.7 kg (215 lb 6.2 oz), SpO2 100.00%.   IV Fluids:  IV in place, occlusive dsg intact without redness, IV cath RT.LFA none.   Allergies:  Morphine sulfate; Remeron; Tuna; and Penicillins  Past Medical History:   has a past medical history of GERD (gastroesophageal reflux disease); Hiatal hernia; HTN (hypertension); CKD (chronic kidney disease), stage III; Atrial fibrillation; Depression; Chronic diastolic heart failure (08/31/2012); Tibia fracture; Renal failure; Respiratory failure; COPD (chronic obstructive pulmonary disease); Metabolic encephalopathy; Renal failure; UTI (urinary tract infection); Pleural effusion; CHF (congestive heart failure); Complication of anesthesia; OSA on CPAP; Type II diabetes mellitus (1980's?); History of blood transfusion (08/2012); Arthritis; Chronic lower back pain; Gout; Anxiety; Fall (08/2012); Anemia; and Chronic a-fib (03/12/2013).  Past Surgical History:   has past surgical history that includes Bunionectomy (Bilateral); Cholecystectomy; Shoulder arthroscopy w/ rotator cuff repair (Left, 2002); Total knee arthroplasty (Bilateral, 2001); Hammer toe surgery (Bilateral, 2004); Colonoscopy (07/11/2011); ORIF periprosthetic fracture (09/08/2012); External fixation leg (09/08/2012); Joint replacement; External fixation leg (11/30/2012); TEE without cardioversion (N/A, 01/19/2013); Shoulder open  rotator cuff repair (Left, 2002); Femur fracture surgery (Right, 08/2012); and Tibia fracture surgery (Left, 08/2012).  Social History:   reports that she has never smoked. She has never used smokeless tobacco. She reports that she does not drink alcohol or use illicit drugs.  Skin: SKIN IS INTACT  Patient/Family orientated to room. Information packet given to patient/family. Admission inpatient armband information verified with patient/family to include name and date of birth and placed on patient arm. Side rails up x 2, fall assessment and education completed with patient/family. Patient/family able to verbalize understanding of risk associated with falls and verbalized understanding to call for assistance before getting out of bed. Call light within reach. Patient/family able to voice and demonstrate understanding of unit orientation instructions.    Will continue to evaluate and treat per MD orders.

## 2013-06-18 NOTE — ED Provider Notes (Signed)
Medical screening examination/treatment/procedure(s) were conducted as a shared visit with resident-physician practitioner(s) and myself.  I personally evaluated the patient during the encounter.  Pt is a 79 y.o. female with pmhx as above presenting with continued fatigue, dyspnea after diagnosed w/ pna/UTI recently.  Pt found to have continued infection & likely failed outpt tx due to medication noncompliance as above.   Pt afebrile, lungs clear, benign abdominal exam.  Pt admitted to medicine service for further tx.     Shanna Cisco, MD 06/18/13 1004

## 2013-06-18 NOTE — Evaluation (Signed)
Physical Therapy Evaluation Patient Details Name: Gina Ashley MRN: [id number] DOB: 07/04/1927 Today's Date: 06/18/2013 Time: 1478-2956 PT Time Calculation (min): 33 min  PT Assessment / Plan / Recommendation History of Present Illness  Patient is an 79 y.o. female with a history of diastolic CHF (EF measured was 21-30% March 2014), chronic kidney disease stage IV, diabetes mellitus type 2, OSA, atrial fibrillation presented to the ER because of weakness.  Patient diagnosed with pna and UTI.  Clinical Impression  Patient presents with problems listed below.  Will benefit from acute PT to maximize independence prior to return home with daughters.  Will need to function at min guard level to return home with daughters.    PT Assessment  Patient needs continued PT services    Follow Up Recommendations  Home health PT;Supervision/Assistance - 24 hour    Does the patient have the potential to tolerate intense rehabilitation      Barriers to Discharge        Equipment Recommendations  None recommended by PT    Recommendations for Other Services     Frequency Min 3X/week    Precautions / Restrictions Precautions Precautions: Fall Required Braces or Orthoses: Other Brace/Splint Other Brace/Splint: Double upright brace on Lt ankle. Restrictions Weight Bearing Restrictions: No   Pertinent Vitals/Pain       Mobility  Bed Mobility Bed Mobility: Not assessed (Patient in chair as PT entered room) Transfers Transfers: Sit to Stand;Stand to Sit Sit to Stand: 4: Min assist;With upper extremity assist;With armrests;From chair/3-in-1 Stand to Sit: 4: Min assist;With upper extremity assist;With armrests;To chair/3-in-1 Details for Transfer Assistance: Verbal cues for hand placement and to scoot to edge of chair before standing.  Patient stood x3 for 30-45 seconds each time.  On each occasion, patient began having tremors in UE's and LE's.  Knees began buckling, so patient returned to  sitting.  Required rest break between each stance. Ambulation/Gait Ambulation/Gait Assistance: Not tested (comment)    Exercises     PT Diagnosis: Difficulty walking;Generalized weakness  PT Problem List: Decreased strength;Decreased activity tolerance;Decreased balance;Decreased mobility;Decreased knowledge of use of DME;Decreased knowledge of precautions;Cardiopulmonary status limiting activity PT Treatment Interventions: DME instruction;Gait training;Functional mobility training;Balance training;Patient/family education     PT Goals(Current goals can be found in the care plan section) Acute Rehab PT Goals Patient Stated Goal: To return home PT Goal Formulation: With patient Time For Goal Achievement: 06/25/13 Potential to Achieve Goals: Good  Visit Information  Last PT Received On: 06/18/13 Assistance Needed: +2 History of Present Illness: Patient is an 79 y.o. female with a history of diastolic CHF (EF measured was 86-57% March 2014), chronic kidney disease stage IV, diabetes mellitus type 2, OSA, atrial fibrillation presented to the ER because of weakness.  Patient diagnosed with pna and UTI.       Prior Functioning  Home Living Family/patient expects to be discharged to:: Private residence Living Arrangements: Children (2 adult daughters) Available Help at Discharge: Family;Available 24 hours/day Type of Home: House Home Access: Level entry Home Layout: One level Home Equipment: Walker - 2 wheels;Bedside commode;Wheelchair - manual Prior Function Level of Independence: Independent with assistive device(s);Needs assistance ADL's / Homemaking Assistance Needed: Homemaking and meals Communication / Swallowing Assistance Needed: Takes sponge baths Communication Communication: No difficulties Dominant Hand: Right    Cognition  Cognition Arousal/Alertness: Awake/alert Behavior During Therapy: WFL for tasks assessed/performed Overall Cognitive Status: Within Functional  Limits for tasks assessed    Extremity/Trunk Assessment Upper Extremity Assessment Upper Extremity  Assessment: Generalized weakness (Noted tremors of UE's and LE's during standing.) Lower Extremity Assessment Lower Extremity Assessment: Generalized weakness;LLE deficits/detail (Noted tremors in UE's and LE's during stance) LLE Deficits / Details: Patient with shoe on Lt foot with double upright AFO (prior fractured tibia)   Balance Balance Balance Assessed: Yes Static Sitting Balance Static Sitting - Balance Support: No upper extremity supported;Feet supported Static Sitting - Level of Assistance: 5: Stand by assistance Static Sitting - Comment/# of Minutes: 5 minutes. Static Standing Balance Static Standing - Balance Support: Bilateral upper extremity supported Static Standing - Level of Assistance: 4: Min assist Static Standing - Comment/# of Minutes: 45 seconds x3.  Patient required min assist to maintain balance due to tremors in all extremities.  End of Session PT - End of Session Equipment Utilized During Treatment: Gait belt Activity Tolerance: Patient limited by fatigue Patient left: in chair;with call bell/phone within reach Nurse Communication: Mobility status  GP     Vena Austria 06/18/2013, 7:03 PM Durenda Hurt. Renaldo Fiddler, [facility] Acute Rehab Services Pager [phone]

## 2013-06-18 NOTE — Progress Notes (Signed)
INITIAL NUTRITION ASSESSMENT  DOCUMENTATION CODES Per approved criteria  -Obesity Unspecified   INTERVENTION: 1. Glucerna Shake po daily, each supplement provides 220 kcal and 10 grams of protein.   NUTRITION DIAGNOSIS: Predicted sub-optimal nutrient intake  related to acute illness as evidenced by hx.   Goal: PO intake to meet >/=90% estimated nutrition needs  Monitor:  PO intake, weight trends, labs  Reason for Assessment: Malnutrition Screening Tool  79 y.o. female  Admitting Dx: Weakness generalized  ASSESSMENT: Pt admitted for weakness, with UTI and PNA.  Seen by SLP for diet, recommended diet advance to regular with thin liquids. Possible MBS?  RD has attempted to speak with pt x3, unable to meet with pt has been out of room or unavailable. Per previous notes, pt likes Glucerna shakes. Weight hx shows weight has been variable, but appears with in usual weight range.   Height: Ht Readings from Last 1 Encounters:  06/17/13 5\' 3"  (1.6 m)    Weight: Wt Readings from Last 1 Encounters:  06/18/13 214 lb 4.6 oz (97.2 kg)    Ideal Body Weight: 115 lbs   % Ideal Body Weight: 186%  Wt Readings from Last 10 Encounters:  06/18/13 214 lb 4.6 oz (97.2 kg)  06/13/13 199 lb (90.266 kg)  06/09/13 205 lb (92.987 kg)  05/05/13 198 lb (89.812 kg)  04/30/13 196 lb (88.905 kg)  04/22/13 194 lb (87.998 kg)  04/09/13 223 lb (101.152 kg)  04/05/13 219 lb 6.4 oz (99.519 kg)  04/01/13 220 lb (99.791 kg)  03/30/13 217 lb 9.6 oz (98.703 kg)    Usual Body Weight: variable, 194-220  % Usual Body Weight: 100%  BMI:  Body mass index is 37.97 kg/(m^2). Obesity class 2  Estimated Nutritional Needs: Kcal: 1675-1800 Protein: 65-75 gm  Fluid: 1.7-1.8 L   Skin: intact   Diet Order: Full Liquid  EDUCATION NEEDS: -No education needs identified at this time  No intake or output data in the 24 hours ending 06/18/13 1510  Last BM: PTA    Labs:   Recent Labs Lab  06/13/13 1714 06/17/13 1741 06/18/13 0645  NA 135 134* 140  K 4.1 3.5 3.0*  CL 87* 85* 89*  CO2 41* 38* 41*  BUN 75* 68* 64*  CREATININE 2.88* 2.87* 2.73*  CALCIUM 9.8 9.8 9.7  GLUCOSE 257* 229* 176*    CBG (last 3)   Recent Labs  06/17/13 2331 06/18/13 0742  GLUCAP 199* 159*    Scheduled Meds: . allopurinol  100 mg Oral Daily  . amiodarone  400 mg Oral Daily  . busPIRone  5 mg Oral Q12H  . carvedilol  25 mg Oral BID WC  . clindamycin (CLEOCIN) IV  600 mg Intravenous Q8H  . docusate sodium  100 mg Oral Q8H  . doxycycline (VIBRAMYCIN) IV  100 mg Intravenous Q12H  . fluticasone  1 spray Each Nare Daily  . guaiFENesin  600 mg Oral BID  . insulin aspart  0-9 Units Subcutaneous TID WC  . insulin aspart  3 Units Subcutaneous TID WC  . insulin glargine  5 Units Subcutaneous QHS  . ondansetron      . pantoprazole  40 mg Oral Daily  . polyethylene glycol  17 g Oral BID  . sodium chloride  3 mL Intravenous Q12H  . sodium chloride  3 mL Intravenous Q12H  . torsemide  80 mg Oral BID  . traZODone  50 mg Oral QHS  . warfarin  2.5 mg Oral ONCE-1800  .  Warfarin - Pharmacist Dosing Inpatient   Does not apply q1800    Continuous Infusions:   Past Medical History  Diagnosis Date  . GERD (gastroesophageal reflux disease)   . Hiatal hernia   . HTN (hypertension)     all her life  . CKD (chronic kidney disease), stage III     GFR: 38  . Atrial fibrillation     since 1996  . Depression   . Chronic diastolic heart failure 08/31/2012  . Tibia fracture     BILATERAL  . Renal failure     acute on chronic   . Respiratory failure     acute on chronic hx of  requiring BIPAP  . COPD (chronic obstructive pulmonary disease)   . Metabolic encephalopathy     hx of   . Renal failure     acute on chronic with need for intermittent dialysis - hx of   . UTI (urinary tract infection)     hx of   . Pleural effusion     right sided now resolved   . CHF (congestive heart failure)      diastolic HF  . Complication of anesthesia     "hard to wake up" (03/11/2013)  . OSA on CPAP   . Type II diabetes mellitus 1980's?  . History of blood transfusion 08/2012    "related to leg OR" (03/11/2013)  . Arthritis     "left arm; lower back, hips, hands" (03/11/2013)  . Chronic lower back pain     "disc pops in and out" (03/11/2013)  . Gout   . Anxiety   . Fall 08/2012    "in nursing home" (03/11/2013)  . Anemia   . Chronic a-fib 03/12/2013    Past Surgical History  Procedure Laterality Date  . Bunionectomy Bilateral     bilateral  . Cholecystectomy    . Shoulder arthroscopy w/ rotator cuff repair Left 2002  . Total knee arthroplasty Bilateral 2001  . Hammer toe surgery Bilateral 2004  . Colonoscopy  07/11/2011    Procedure: COLONOSCOPY;  Surgeon: Malissa Hippo, MD;  Location: [facility];  Service: Endoscopy;  Laterality: N/A;  3:00  . Orif periprosthetic fracture  09/08/2012    Procedure: OPEN REDUCTION INTERNAL FIXATION (ORIF) PERIPROSTHETIC FRACTURE;  Surgeon: Shelda Pal, MD;  Location: [facility] ORS;  Service: Orthopedics;  Laterality: Right;  ORIF periprosthetic right proximal femur fracture   . External fixation leg  09/08/2012    Procedure: EXTERNAL FIXATION LEG;  Surgeon: Shelda Pal, MD;  Location: [facility] ORS;  Service: Orthopedics;  Laterality: Left;  . Joint replacement    . External fixation leg  11/30/2012    Procedure: EXTERNAL FIXATION LEG;  Surgeon: Shelda Pal, MD;  Location: [facility] OR[facility];  Service: Orthopedics;  Laterality: Left;  Removal of External Fixation Left Knee with Evaluation with Floroscopy  . Tee without cardioversion N/A 01/19/2013    Procedure: TRANSESOPHAGEAL ECHOCARDIOGRAM (TEE);  Surgeon: Lewayne Bunting, MD;  Location: [facility] END[facility];  Service: Cardiovascular;  Laterality: N/A;  . Shoulder open rotator cuff repair Left 2002    "maybe 3 months after scope" (03/11/2013)  . Femur fracture surgery Right 08/2012    "fell at nursing home" (03/11/2013)  . Tibia  fracture surgery Left 08/2012    "fell at nursing home" (03/11/2013)    Clarene Duke RD, LDN Pager [phone] After Hours pager [phone]

## 2013-06-18 NOTE — Consult Note (Signed)
[facility]    Date of Admission:  06/17/2013  Date of Consult:  06/18/2013  Reason for Consult: CRKP (carbapenem resistant K. pna) Referring Physician: Dr. Peggye Pitt   HPI: Gina Ashley is an 79 y.o. female. with PMH DMII, CHF (55-60%), CKD IV, COPD, afib, pneumonia, B/L pleural effusions and CRKP who was just seen recently in the ED this past weekend for weakness x 3-4 days. She was diagnosed with pna and UTI and discharged home on Levaquin with subsequent culture/sensitives showing resistance to FQ's and she was called and told to stop taking her medication. Her UTI was the same CRE Klebsiella she has had in her urine since May 2014. She returns to the ED yesterday with continued complaints of fatigue, weakness, and burning with urination. She also endorses night sweats and chills and has mild difficulty breathing when trying to take in a deep breath.    Past Medical History  Diagnosis Date  . GERD (gastroesophageal reflux disease)   . Hiatal hernia   . HTN (hypertension)     all her life  . CKD (chronic kidney disease), stage III     GFR: 38  . Atrial fibrillation     since 1996  . Depression   . Chronic diastolic heart failure 08/31/2012  . Tibia fracture     BILATERAL  . Renal failure     acute on chronic   . Respiratory failure     acute on chronic hx of  requiring BIPAP  . COPD (chronic obstructive pulmonary disease)   . Metabolic encephalopathy     hx of   . Renal failure     acute on chronic with need for intermittent dialysis - hx of   . UTI (urinary tract infection)     hx of   . Pleural effusion     right sided now resolved   . CHF (congestive heart failure)     diastolic HF  . Complication of anesthesia     "hard to wake up" (03/11/2013)  . OSA on CPAP   . Type II diabetes mellitus 1980's?  . History of blood transfusion 08/2012    "related to leg OR" (03/11/2013)  . Arthritis     "left arm; lower back, hips, hands"  (03/11/2013)  . Chronic lower back pain     "disc pops in and out" (03/11/2013)  . Gout   . Anxiety   . Fall 08/2012    "in nursing home" (03/11/2013)  . Anemia   . Chronic a-fib 03/12/2013    Past Surgical History  Procedure Laterality Date  . Bunionectomy Bilateral     bilateral  . Cholecystectomy    . Shoulder arthroscopy w/ rotator cuff repair Left 2002  . Total knee arthroplasty Bilateral 2001  . Hammer toe surgery Bilateral 2004  . Colonoscopy  07/11/2011    Procedure: COLONOSCOPY;  Surgeon: Malissa Hippo, MD;  Location: [facility];  Service: Endoscopy;  Laterality: N/A;  3:00  . Orif periprosthetic fracture  09/08/2012    Procedure: OPEN REDUCTION INTERNAL FIXATION (ORIF) PERIPROSTHETIC FRACTURE;  Surgeon: Shelda Pal, MD;  Location: [facility];  Service: Orthopedics;  Laterality: Right;  ORIF periprosthetic right proximal femur fracture   . External fixation leg  09/08/2012    Procedure: EXTERNAL FIXATION LEG;  Surgeon: Shelda Pal, MD;  Location: [facility];  Service: Orthopedics;  Laterality: Left;  . Joint replacement    . External fixation leg  11/30/2012    Procedure: EXTERNAL FIXATION LEG;  Surgeon: Shelda Pal, MD;  Location: [facility];  Service: Orthopedics;  Laterality: Left;  Removal of External Fixation Left Knee with Evaluation with Floroscopy  . Tee without cardioversion N/A 01/19/2013    Procedure: TRANSESOPHAGEAL ECHOCARDIOGRAM (TEE);  Surgeon: Lewayne Bunting, MD;  Location: [facility] ENDOSCOPY;  Service: Cardiovascular;  Laterality: N/A;  . Shoulder open rotator cuff repair Left 2002    "maybe 3 months after scope" (03/11/2013)  . Femur fracture surgery Right 08/2012    "fell at nursing home" (03/11/2013)  . Tibia fracture surgery Left 08/2012    "fell at nursing home" (03/11/2013)  ergies:   Allergies  Allergen Reactions  . Morphine Sulfate Other (See Comments)    REACTION: change in personality  . Remeron [Mirtazapine] Other (See Comments)    Altered mental status,  lethargy  . Prescott Gum [Fish Allergy] Other (See Comments)    unknown  . Penicillins Rash    Tolerates Ancef.     Medications: I have reviewed patients current medications as documented in Epic Anti-infectives   Start     Dose/Rate Route Frequency Ordered Stop   06/18/13 1100  clindamycin (CLEOCIN) IVPB 600 mg     600 mg 100 mL/hr over 30 Minutes Intravenous Every 8 hours 06/18/13 0946     06/18/13 0900  doxycycline (VIBRAMYCIN) 100 mg in dextrose 5 % 250 mL IVPB     100 mg 125 mL/hr over 120 Minutes Intravenous Every 12 hours 06/18/13 0815     06/18/13 0015  cefTRIAXone (ROCEPHIN) 1 g in dextrose 5 % 50 mL IVPB  Status:  Discontinued     1 g 100 mL/hr over 30 Minutes Intravenous Every 24 hours 06/18/13 0001 06/18/13 0942   06/18/13 0015  azithromycin (ZITHROMAX) 500 mg in dextrose 5 % 250 mL IVPB  Status:  Discontinued     500 mg 250 mL/hr over 60 Minutes Intravenous Every 24 hours 06/18/13 0001 06/18/13 0942   06/17/13 2045  levofloxacin (LEVAQUIN) IVPB 750 mg     750 mg 100 mL/hr over 90 Minutes Intravenous  Once 06/17/13 2042 06/17/13 2228      Social History:  reports that she has never smoked. She has never used smokeless tobacco. She reports that she does not drink alcohol or use illicit drugs.  Family History  Problem Relation Age of Onset  . Colon cancer Brother     As in HPI and primary teams notes otherwise 12 point review of systems is negative  Blood pressure 143/85, pulse 114, temperature 98.3 F (36.8 C), temperature source Oral, resp. rate 18, height 5\' 3"  (1.6 m), weight 97.2 kg (214 lb 4.6 oz), SpO2 96.00%. General: Alert and awake but lethargic, oriented x3, not in any acute distress. HEENT: anicteric sclera, pupils reactive to light and accommodation, EOMI, oropharynx clear and without exudate CVS regular rate, normal r,  no murmur rubs or gallops Chest: decreased breath sounds Right lung base, CTA left lung fields Abdomen: soft nontender, nondistended,  normal bowel sounds Extremities: no  clubbing or edema noted bilaterally Skin: no rashes Neuro: nonfocal, strength and sensation intact   Results for orders placed during the hospital encounter of 06/17/13 (from the past 48 hour(s))  BASIC METABOLIC PANEL     Status: Abnormal   Collection Time    06/17/13  5:41 PM      Result Value Range   Sodium 134 (*) 135 - 145 mEq/L   Potassium 3.5  3.5 - 5.1 mEq/L   Chloride 85 (*) 96 - 112 mEq/L   CO2 38 (*) 19 - 32 mEq/L   Glucose, Bld 229 (*) 70 - 99 mg/dL   BUN 68 (*) 6 - 23 mg/dL   Creatinine, Ser 1.61 (*) 0.50 - 1.10 mg/dL   Calcium 9.8  8.4 - 09.6 mg/dL   GFR calc non Af Amer 14 (*) >90 mL/min   GFR calc Af Amer 16 (*) >90 mL/min   Comment: (NOTE)     The eGFR has been calculated using the CKD EPI equation.     This calculation has not been validated in all clinical situations.     eGFR's persistently <90 mL/min signify possible Chronic Kidney     Disease.  TROPONIN I     Status: None   Collection Time    06/17/13  5:41 PM      Result Value Range   Troponin I <0.30  <0.30 ng/mL   Comment:            Due to the release kinetics of cTnI,     a negative result within the first hours     of the onset of symptoms does not rule out     myocardial infarction with certainty.     If myocardial infarction is still suspected,     repeat the test at appropriate intervals.  CBC WITH DIFFERENTIAL     Status: Abnormal   Collection Time    06/17/13  5:41 PM      Result Value Range   WBC 6.0  4.0 - 10.5 K/uL   RBC 4.08  3.87 - 5.11 MIL/uL   Hemoglobin 11.7 (*) 12.0 - 15.0 g/dL   HCT 04.5 (*) 40.9 - 81.1 %   MCV 86.5  78.0 - 100.0 fL   MCH 28.7  26.0 - 34.0 pg   MCHC 33.1  30.0 - 36.0 g/dL   RDW 91.4  78.2 - 95.6 %   Platelets 101 (*) 150 - 400 K/uL   Comment: CONSISTENT WITH PREVIOUS RESULT   Neutrophils Relative % 77  43 - 77 %   Neutro Abs 4.7  1.7 - 7.7 K/uL   Lymphocytes Relative 15  12 - 46 %   Lymphs Abs 0.9  0.7 - 4.0 K/uL    Monocytes Relative 6  3 - 12 %   Monocytes Absolute 0.3  0.1 - 1.0 K/uL   Eosinophils Relative 2  0 - 5 %   Eosinophils Absolute 0.1  0.0 - 0.7 K/uL   Basophils Relative 0  0 - 1 %   Basophils Absolute 0.0  0.0 - 0.1 K/uL  URINALYSIS, ROUTINE W REFLEX MICROSCOPIC     Status: Abnormal   Collection Time    06/17/13  7:20 PM      Result Value Range   Color, Urine YELLOW  YELLOW   APPearance CLOUDY (*) CLEAR   Specific Gravity, Urine 1.013  1.005 - 1.030   pH 5.5  5.0 - 8.0   Glucose, UA NEGATIVE  NEGATIVE mg/dL   Hgb urine dipstick SMALL (*) NEGATIVE   Bilirubin Urine NEGATIVE  NEGATIVE   Ketones, ur NEGATIVE  NEGATIVE mg/dL   Protein, ur 30 (*) NEGATIVE mg/dL   Urobilinogen, UA 1.0  0.0 - 1.0 mg/dL   Nitrite NEGATIVE  NEGATIVE   Leukocytes, UA LARGE (*) NEGATIVE  URINE MICROSCOPIC-ADD ON     Status: Abnormal   Collection Time    06/17/13  7:20 PM      Result Value Range   Squamous Epithelial / LPF FEW (*) RARE   WBC, UA TOO NUMEROUS TO COUNT  <3 WBC/hpf   RBC / HPF 3-6  <3 RBC/hpf   Bacteria, UA MANY (*) RARE   Casts HYALINE CASTS (*) NEGATIVE  GLUCOSE, CAPILLARY     Status: Abnormal   Collection Time    06/17/13 11:31 PM      Result Value Range   Glucose-Capillary 199 (*) 70 - 99 mg/dL   Comment 1 Documented in Chart     Comment 2 Notify RN    TROPONIN I     Status: None   Collection Time    06/17/13 11:58 PM      Result Value Range   Troponin I <0.30  <0.30 ng/mL   Comment:            Due to the release kinetics of cTnI,     a negative result within the first hours     of the onset of symptoms does not rule out     myocardial infarction with certainty.     If myocardial infarction is still suspected,     repeat the test at appropriate intervals.  TSH     Status: None   Collection Time    06/17/13 11:58 PM      Result Value Range   TSH 0.737  0.350 - 4.500 uIU/mL   Comment: Performed at [facility]  CORTISOL     Status: None   Collection Time     06/17/13 11:58 PM      Result Value Range   Cortisol, Plasma 20.2     Comment: (NOTE)     AM:  4.3 - 22.4 ug/dL     PM:  3.1 - 04.5 ug/dL     Performed at [facility]  BASIC METABOLIC PANEL     Status: Abnormal   Collection Time    06/18/13  6:45 AM      Result Value Range   Sodium 140  135 - 145 mEq/L   Potassium 3.0 (*) 3.5 - 5.1 mEq/L   Chloride 89 (*) 96 - 112 mEq/L   CO2 41 (*) 19 - 32 mEq/L   Comment: CRITICAL RESULT CALLED TO, READ BACK BY AND VERIFIED WITH:     J.GRAY,RN [id number] 06/18/13 CLARK,S   Glucose, Bld 176 (*) 70 - 99 mg/dL   BUN 64 (*) 6 - 23 mg/dL   Creatinine, Ser 4.09 (*) 0.50 - 1.10 mg/dL   Calcium 9.7  8.4 - 81.1 mg/dL   GFR calc non Af Amer 15 (*) >90 mL/min   GFR calc Af Amer 17 (*) >90 mL/min   Comment: (NOTE)     The eGFR has been calculated using the CKD EPI equation.     This calculation has not been validated in all clinical situations.     eGFR's persistently <90 mL/min signify possible Chronic Kidney     Disease.  CBC     Status: Abnormal   Collection Time    06/18/13  6:45 AM      Result Value Range   WBC 5.6  4.0 - 10.5 K/uL   RBC 3.76 (*) 3.87 - 5.11 MIL/uL   Hemoglobin 10.5 (*) 12.0 - 15.0 g/dL   HCT 91.4 (*) 78.2 - 95.6 %   MCV 87.8  78.0 - 100.0 fL   MCH 27.9  26.0 - 34.0 pg  MCHC 31.8  30.0 - 36.0 g/dL   RDW 16.1  09.6 - 04.5 %   Platelets 96 (*) 150 - 400 K/uL   Comment: CONSISTENT WITH PREVIOUS RESULT  PROTIME-INR     Status: Abnormal   Collection Time    06/18/13  6:45 AM      Result Value Range   Prothrombin Time 25.5 (*) 11.6 - 15.2 seconds   INR 2.42 (*) 0.00 - 1.49  TROPONIN I     Status: None   Collection Time    06/18/13  6:45 AM      Result Value Range   Troponin I <0.30  <0.30 ng/mL   Comment:            Due to the release kinetics of cTnI,     a negative result within the first hours     of the onset of symptoms does not rule out     myocardial infarction with certainty.     If myocardial infarction is  still suspected,     repeat the test at appropriate intervals.  GLUCOSE, CAPILLARY     Status: Abnormal   Collection Time    06/18/13  7:42 AM      Result Value Range   Glucose-Capillary 159 (*) 70 - 99 mg/dL   Comment 1 Documented in Chart     Comment 2 Notify RN    TROPONIN I     Status: None   Collection Time    06/18/13 11:12 AM      Result Value Range   Troponin I <0.30  <0.30 ng/mL   Comment:            Due to the release kinetics of cTnI,     a negative result within the first hours     of the onset of symptoms does not rule out     myocardial infarction with certainty.     If myocardial infarction is still suspected,     repeat the test at appropriate intervals.      Component Value Date/Time   SDES URINE, RANDOM 06/13/2013 1735   SPECREQUEST NONE 06/13/2013 1735   CULT  Value: KLEBSIELLA PNEUMONIAE Note: THIS ISOLATE HAS PREVIOUSLY BEEN CONFIRMED AS A [facility] CARBAPENEMASE PRODUCER CRITICAL RESULT CALLED TO, READ BACK BY AND VERIFIED WITH: JANET GREENE@2 :45PM ON 06/15/13 BY DANTS Performed at [facility] 06/13/2013 1735   REPTSTATUS 06/16/2013 FINAL 06/13/2013 1735   Dg Chest 2 View  06/17/2013   *RADIOLOGY REPORT*  Clinical Data: Fatigue with shortness of breath and left-sided chest pain.  History hypertension.  CHEST - 2 VIEW  Comparison: 06/13/2013  Findings: Lateral view degraded by patient arm position.  Cardiomegaly with atherosclerosis in the transverse aorta. Moderate right-sided pleural effusion which is not significantly changed.  No left-sided pleural effusion. No pneumothorax.  Slight increase in mild interstitial edema.  No change in right base air space disease.  IMPRESSION: Cardiomegaly with slight increase in mild interstitial edema.  Right-sided pleural effusion with adjacent infection or atelectasis, unchanged.   Original Report Authenticated By: Jeronimo Greaves, M.D.   Dg Esophagus  06/18/2013   CLINICAL DATA:  Dysphagia. Patient reports that food and pills  sticking in the esophagus.  EXAM: ESOPHOGRAM/BARIUM SWALLOW  TECHNIQUE: Single contrast examination performed using thin barium liquid given the patient's limited mobility.  COMPARISON:  Chest CT 03/21/2013.  FLUOROSCOPY TIME:  Twenty-eight seconds.  FINDINGS: Study was performed in the supine semi-erect position. Mucosal assessment is limited  by the patient's limited mobility and inability to obtain air contrast views. The primary stripping wave is fairly well maintained. There is no evidence of stricture, mass or ulceration. There is a small hiatal hernia. A barium tablet passed without delay into the stomach. No significant reflux was elicited with the water siphon test.  IMPRESSION: Small hiatal hernia. No evidence of esophageal stricture or ulceration.   Electronically Signed   By: Roxy Horseman   On: 06/18/2013 13:01     Recent Results (from the past 720 hour(s))  URINE CULTURE     Status: None   Collection Time    06/13/13  5:35 PM      Result Value Range Status   Specimen Description URINE, RANDOM   Final   Special Requests NONE   Final   Culture  Setup Time     Final   Value: 06/14/2013 02:53     Performed at [facility] Count     Final   Value: >=100,000 COLONIES/ML     Performed at [facility]   Culture     Final   Value: KLEBSIELLA PNEUMONIAE     Note: THIS ISOLATE HAS PREVIOUSLY BEEN CONFIRMED AS A [facility] CARBAPENEMASE PRODUCER CRITICAL RESULT CALLED TO, READ BACK BY AND VERIFIED WITH: JANET GREENE@2 :45PM ON 06/15/13 BY DANTS     Performed at [facility]   Report Status 06/16/2013 FINAL   Final   Organism ID, Bacteria KLEBSIELLA PNEUMONIAE   Final     Impression/Recommendation Ms. Junior Kenedy is an 79 yo female with history of recurrent pleural effusions and CRKP, who returns to the hospital after recent treatment for pna and UTI. Her c/s report showed resistance to the levaquin she was sent home on and thus she was told to stop taking it. She  is now symptomatic to her UTI and so it would be indicated in this circumstance to treat accordingly.  CRKP: --d/c clinda, doxycycline, and azithro --begin fosfomycin 3 g po  Pleural effusion: This seems to be a chronic issue for her as well, she has had this effusion and has had it drained once before and it continues to be symptomatic. Per Light's criteria for draining performed in May 2014 this was of a transudate process. --consult IR for drainage as her effusion has increased over the past several months and is also causing her dyspnea   Thank you so much for this interesting consult  [facility] [phone] (pager) [phone] (office) 06/18/2013, 2:58 PM  Lewie Chamber 06/18/2013, 2:58 PM

## 2013-06-19 LAB — BASIC METABOLIC PANEL
BUN: 54 mg/dL — ABNORMAL HIGH (ref 6–23)
Calcium: 9.7 mg/dL (ref 8.4–10.5)
GFR calc non Af Amer: 19 mL/min — ABNORMAL LOW (ref >90)
Glucose, Bld: 279 mg/dL — ABNORMAL HIGH (ref 70–99)
Potassium: 3.3 mEq/L — ABNORMAL LOW (ref 3.5–5.1)

## 2013-06-19 LAB — PROTEIN, TOTAL: Total Protein: 6.7 g/dL (ref 6.0–8.3)

## 2013-06-19 LAB — GLUCOSE, CAPILLARY
Glucose-Capillary: 115 mg/dL — ABNORMAL HIGH (ref 70–99)
Glucose-Capillary: 284 mg/dL — ABNORMAL HIGH (ref 70–99)

## 2013-06-19 LAB — PROTIME-INR: Prothrombin Time: 24.7 seconds — ABNORMAL HIGH (ref 11.6–15.2)

## 2013-06-19 MED ORDER — HYDROCODONE-ACETAMINOPHEN 5-325 MG PO TABS: 1.0000 | ORAL_TABLET | Freq: Once | ORAL | Status: DC

## 2013-06-19 MED ORDER — NYSTATIN 100000 UNIT/ML MT SUSP
5.0000 mL | Freq: Four times a day (QID) | OROMUCOSAL | Status: DC
  Administered 2013-06-19: 500000 [IU] via ORAL
  Filled 2013-06-19 (×3): qty 5

## 2013-06-19 MED ORDER — WARFARIN SODIUM 2.5 MG PO TABS
2.5000 mg | ORAL_TABLET | Freq: Once | ORAL | Status: DC
  Filled 2013-06-19: qty 1

## 2013-06-19 MED ORDER — HYDROCODONE-ACETAMINOPHEN 7.5-325 MG PO TABS
1.0000 | ORAL_TABLET | Freq: Four times a day (QID) | ORAL | Status: DC | PRN
  Administered 2013-06-19 (×2): 1 via ORAL
  Filled 2013-06-19 (×2): qty 1

## 2013-06-19 MED ORDER — HYDROCODONE-ACETAMINOPHEN 5-325 MG PO TABS: 2.0000 | ORAL_TABLET | Freq: Once | ORAL | Status: DC

## 2013-06-19 MED ORDER — NYSTATIN 100000 UNIT/ML MT SUSP: 5.0000 mL | Freq: Four times a day (QID) | OROMUCOSAL | Status: DC

## 2013-06-19 NOTE — Progress Notes (Signed)
ANTICOAGULATION CONSULT NOTE - Follow Up Consult  Pharmacy Consult for Coumadin Indication: atrial fibrillation  Allergies  Allergen Reactions  . Morphine Sulfate Other (See Comments)    REACTION: change in personality  . Remeron [Mirtazapine] Other (See Comments)    Altered mental status, lethargy  . Prescott Gum [Fish Allergy] Other (See Comments)    unknown  . Penicillins Rash    Tolerates Ancef.    Patient Measurements: Height: 5\' 3"  (160 cm) Weight: 214 lb 8.1 oz (97.3 kg) IBW/kg (Calculated) : 52.4  Vital Signs: Temp: 98.6 F (37 C) (08/16 0548) Temp src: Oral (08/16 0548) BP: 101/84 mmHg (08/16 1014) Pulse Rate: 92 (08/16 1014)  Labs:  Recent Labs  06/17/13 1741 06/17/13 2358 06/18/13 0645 06/18/13 1112 06/19/13 1022  HGB 11.7*  --  10.5*  --   --   HCT 35.3*  --  33.0*  --   --   PLT 101*  --  96*  --   --   LABPROT  --   --  25.5*  --  24.7*  INR  --   --  2.42*  --  2.32*  CREATININE 2.87*  --  2.73*  --  2.20*  TROPONINI <0.30 <0.30 <0.30 <0.30  --     Estimated Creatinine Clearance: 21.2 ml/min (by C-G formula based on Cr of 2.2).   Medications:  Prescriptions prior to admission  Medication Sig Dispense Refill  . acetaminophen (TYLENOL) 325 MG tablet Take 650 mg by mouth every 6 (six) hours as needed for pain.      Marland Kitchen allopurinol (ZYLOPRIM) 100 MG tablet Take 100 mg by mouth daily.      Marland Kitchen ALPRAZolam (XANAX) 0.25 MG tablet Take one half tablet by mouth every 8 hours as needed for anxiety  45 tablet  5  . amiodarone (PACERONE) 200 MG tablet Take 400 mg by mouth daily.      . busPIRone (BUSPAR) 5 MG tablet Take 5 mg by mouth every 12 (twelve) hours.       . carvedilol (COREG) 25 MG tablet Take 25 mg by mouth 2 (two) times daily with a meal.      . docusate sodium (COLACE) 100 MG capsule Take 100 mg by mouth every 8 (eight) hours.       . fluticasone (FLONASE) 50 MCG/ACT nasal spray Place 1 spray into the nose daily.      Marland Kitchen guaiFENesin (MUCINEX) 600 MG 12  hr tablet Take 600 mg by mouth 2 (two) times daily.       Marland Kitchen HYDROcodone-acetaminophen (NORCO) 7.5-325 MG per tablet Take 1 tablet by mouth at bedtime as needed for pain.      Marland Kitchen insulin aspart (NOVOLOG) 100 UNIT/ML injection Inject 5 Units into the skin 3 (three) times daily with meals. If cbg is greater than 150      . insulin glargine (LANTUS) 100 UNIT/ML injection Inject 5 Units into the skin at bedtime.      . Melatonin 3 MG CAPS Take 1 capsule by mouth at bedtime.      . metolazone (ZAROXOLYN) 2.5 MG tablet Take 2.5 mg every Wednesday or if weight greater than 205 lbs  15 tablet  6  . nitroGLYCERIN (NITROSTAT) 0.4 MG SL tablet Place 0.4 mg under the tongue every 5 (five) minutes as needed for chest pain.      Marland Kitchen omeprazole (PRILOSEC) 20 MG capsule Take 40 mg by mouth daily.       . ondansetron (ZOFRAN)  4 MG tablet Take 1 tablet (4 mg total) by mouth every 6 (six) hours as needed for nausea.  20 tablet    . polyethylene glycol (MIRALAX / GLYCOLAX) packet Take 17 g by mouth 2 (two) times daily.       Marland Kitchen torsemide (DEMADEX) 20 MG tablet Take 4 tablets (80 mg total) by mouth 2 (two) times daily.  60 tablet  0  . traZODone (DESYREL) 50 MG tablet Take 50 mg by mouth at bedtime.      Marland Kitchen warfarin (COUMADIN) 5 MG tablet Take 2.5 mg by mouth daily.      . [DISCONTINUED] levofloxacin (LEVAQUIN) 750 MG tablet Take 1 tablet (750 mg total) by mouth daily. X 7 days  7 tablet  0   Scheduled:  . allopurinol  100 mg Oral Daily  . amiodarone  400 mg Oral Daily  . busPIRone  5 mg Oral Q12H  . carvedilol  25 mg Oral BID WC  . docusate sodium  100 mg Oral Q8H  . feeding supplement  237 mL Oral Q24H  . fluticasone  1 spray Each Nare Daily  . guaiFENesin  600 mg Oral BID  . insulin aspart  0-9 Units Subcutaneous TID WC  . insulin aspart  3 Units Subcutaneous TID WC  . insulin glargine  5 Units Subcutaneous QHS  . nystatin  5 mL Oral QID  . pantoprazole  40 mg Oral Daily  . polyethylene glycol  17 g Oral BID   . sodium chloride  3 mL Intravenous Q12H  . torsemide  80 mg Oral BID  . traZODone  50 mg Oral QHS  . Warfarin - Pharmacist Dosing Inpatient   Does not apply q1800    Assessment: 79 yo F with progressive weakness, being treated for possible PNA and drug resistant UTI, is continuing her PTA Coumadin for afib.  Patient is on antibiotics and continuing PTA amiodarone that have the potential to increase the effect of Coumadin, but patient seems stable on her home regimen of 2.5mg  with a therapeutic INR of 2.2 today and therapeutic levels for the past two days as well.  No complications of bleeding noted. Will continue with home regimen dose tonight.  Goal of Therapy:  INR 2-3 Monitor platelets by anticoagulation protocol: Yes   Plan:  - Coumadin 2.5mg  PO x 1 dose tonight - daily INRs - monitor for s/s of bleeding  Harrold Donath E. Achilles Dunk, PharmD Clinical Pharmacist - Resident Pager: [phone] Pharmacy: [phone] 06/19/2013 12:44 PM

## 2013-06-19 NOTE — Discharge Summary (Signed)
Physician Discharge Summary  Gina Ashley MVH:[id number] DOB: 26-Apr-1927 DOA: 06/17/2013  PCP: Kimber Relic, MD  Admit date: 06/17/2013 Discharge date: 06/19/2013  Time spent: 45 minutes  Recommendations for Outpatient Follow-up:  -Advised to follow up with her PCP in 1 week.   Discharge Diagnoses:  Principal Problem:   Weakness generalized Active Problems:   DM   CKD (chronic kidney disease)   Chronic diastolic heart failure   Chronic a-fib   Community acquired pneumonia   Discharge Condition: Stable and improved  Filed Weights   06/17/13 2225 06/18/13 0514 06/19/13 0548  Weight: 97.7 kg (215 lb 6.2 oz) 97.2 kg (214 lb 4.6 oz) 97.3 kg (214 lb 8.1 oz)    History of present illness:  Gina Ashley is a 79 y.o. female history of diastolic CHF with last EF measured was 5560% and March 2014, chronic kidney disease stage IV, diabetes mellitus type 2, OSA, atrial fibrillation presented to the ER because of weakness. Patient has been feeling weak for last 3-4 days. While waiting in the ER patient had mild chest discomfort which lasted for a few minutes and resolved spontaneously. The pain was in the left anterior chest wall. Denies any shortness of breath more than usual denies any productive cough fever chills nausea vomiting abdominal pain any focal deficits diarrhea. Patient had come with similar complaints 3 days ago and was diagnosed with pneumonia and UTI and discharged home on Levaquin. Despite taking which patient was still feeling weak and had come to the ER pain in the ER patient's labs were unremarkable except for chest x-ray showing persistent infiltrates concerning for pneumonia and UA showed features of UTI. Patient's recent urine culture grew Klebsiella pneumonia and reviewing patient's chart patient has been having Klebsiella pneumonia positive cultures multiple times. Klebsiella pneumoniae is resistant to multiple antibiotics. We were asked to admit her for further evaluation  and management.   Hospital Course:   Generalized Weakness -Per patient much improved. -B12/TSH ok. -Suspect related to untreated UTI (see below for details).  Klebsiella UTI -Carbapenamase producer and hence, multidrug resistant. -Per ID recs, was given a dose of fosfomycin.  Chronic Right Pleural Effusion -Believed to be 2/2 CHF. -Was transudative per Light's criteria in 5/14. -Repeat thoracentesis not indicated as she does not have increased oxygen requirements, increased work of breathing or symptoms suggestive of PNA.  Chronic Diastolic CHF -Compensated this admission.  A Fib -Rate controlled. -Therapeutic on coumadin.  CKD Stage IV -Cr at baseline.  DM -Stable.  Procedures:  None   Consultations:  ID  Discharge Instructions  Discharge Orders   Future Appointments Provider Department Dept Phone   07/08/2013 1:40 PM [facility] [phone]   Future Orders Complete By Expires   Diet - low sodium heart healthy  As directed    Discontinue IV  As directed    Increase activity slowly  As directed        Medication List    STOP taking these medications       levofloxacin 750 MG tablet  Commonly known as:  LEVAQUIN      TAKE these medications       acetaminophen 325 MG tablet  Commonly known as:  TYLENOL  Take 650 mg by mouth every 6 (six) hours as needed for pain.     allopurinol 100 MG tablet  Commonly known as:  ZYLOPRIM  Take 100 mg by mouth daily.     ALPRAZolam 0.25  MG tablet  Commonly known as:  XANAX  Take one half tablet by mouth every 8 hours as needed for anxiety     amiodarone 200 MG tablet  Commonly known as:  PACERONE  Take 400 mg by mouth daily.     busPIRone 5 MG tablet  Commonly known as:  BUSPAR  Take 5 mg by mouth every 12 (twelve) hours.     carvedilol 25 MG tablet  Commonly known as:  COREG  Take 25 mg by mouth 2 (two) times daily with a meal.     docusate  sodium 100 MG capsule  Commonly known as:  COLACE  Take 100 mg by mouth every 8 (eight) hours.     fluticasone 50 MCG/ACT nasal spray  Commonly known as:  FLONASE  Place 1 spray into the nose daily.     guaiFENesin 600 MG 12 hr tablet  Commonly known as:  MUCINEX  Take 600 mg by mouth 2 (two) times daily.     HYDROcodone-acetaminophen 7.5-325 MG per tablet  Commonly known as:  NORCO  Take 1 tablet by mouth at bedtime as needed for pain.     insulin aspart 100 UNIT/ML injection  Commonly known as:  novoLOG  Inject 5 Units into the skin 3 (three) times daily with meals. If cbg is greater than 150     insulin glargine 100 UNIT/ML injection  Commonly known as:  LANTUS  Inject 5 Units into the skin at bedtime.     Melatonin 3 MG Caps  Take 1 capsule by mouth at bedtime.     metolazone 2.5 MG tablet  Commonly known as:  ZAROXOLYN  Take 2.5 mg every Wednesday or if weight greater than 205 lbs     nitroGLYCERIN 0.4 MG SL tablet  Commonly known as:  NITROSTAT  Place 0.4 mg under the tongue every 5 (five) minutes as needed for chest pain.     nystatin 100000 UNIT/ML suspension  Commonly known as:  MYCOSTATIN  Take 5 mL (500,000 Units total) by mouth 4 (four) times daily.     omeprazole 20 MG capsule  Commonly known as:  PRILOSEC  Take 40 mg by mouth daily.     ondansetron 4 MG tablet  Commonly known as:  ZOFRAN  Take 1 tablet (4 mg total) by mouth every 6 (six) hours as needed for nausea.     polyethylene glycol packet  Commonly known as:  MIRALAX / GLYCOLAX  Take 17 g by mouth 2 (two) times daily.     torsemide 20 MG tablet  Commonly known as:  DEMADEX  Take 4 tablets (80 mg total) by mouth 2 (two) times daily.     traZODone 50 MG tablet  Commonly known as:  DESYREL  Take 50 mg by mouth at bedtime.     warfarin 5 MG tablet  Commonly known as:  COUMADIN  Take 2.5 mg by mouth daily.       Allergies  Allergen Reactions  . Morphine Sulfate Other (See Comments)     REACTION: change in personality  . Remeron [Mirtazapine] Other (See Comments)    Altered mental status, lethargy  . Prescott Gum [Fish Allergy] Other (See Comments)    unknown  . Penicillins Rash    Tolerates Ancef.       Follow-up Information   Follow up with GREEN, Lenon Curt, MD. Schedule an appointment as soon as possible for a visit in 2 weeks.   Specialty:  Internal Medicine  Contact information:   [address] [place] [address] [phone]        The results of significant diagnostics from this hospitalization (including imaging, microbiology, ancillary and laboratory) are listed below for reference.    Significant Diagnostic Studies: Dg Chest 2 View  06/17/2013   *RADIOLOGY REPORT*  Clinical Data: Fatigue with shortness of breath and left-sided chest pain.  History hypertension.  CHEST - 2 VIEW  Comparison: 06/13/2013  Findings: Lateral view degraded by patient arm position.  Cardiomegaly with atherosclerosis in the transverse aorta. Moderate right-sided pleural effusion which is not significantly changed.  No left-sided pleural effusion. No pneumothorax.  Slight increase in mild interstitial edema.  No change in right base air space disease.  IMPRESSION: Cardiomegaly with slight increase in mild interstitial edema.  Right-sided pleural effusion with adjacent infection or atelectasis, unchanged.   Original Report Authenticated By: Jeronimo Greaves, M.D.   Dg Chest 2 View  06/13/2013   *RADIOLOGY REPORT*  Clinical Data: Weakness.  Gastroesophageal reflux disease.  COPD. Respiratory failure.  CHEST - 2 VIEW  Comparison: 05/03/2013  Findings: Lateral view degraded by patient arm position.  Moderate cardiomegaly with a tortuous descending thoracic aorta. Moderate right-sided pleural effusion is increased.  No left-sided effusion. No pneumothorax.  Interstitial edema is mild and similar. Worsened right lower lobe airspace disease.  IMPRESSION: Mild congestive heart failure.  Worsened  right-sided aeration with increased moderate pleural effusion and adjacent airspace disease.  This airspace disease is suspicious for concurrent infection. Recommend radiographic follow- up until clearing.   Original Report Authenticated By: Jeronimo Greaves, M.D.   Dg Chest Right Decubitus  06/18/2013   CLINICAL DATA:  Right pleural effusion.  EXAM: CHEST - RIGHT DECUBITUS  COMPARISON:  06/17/2013  FINDINGS: There is a small freely layering right pleural effusion.  IMPRESSION: Small, freely layering right pleural effusion.   Electronically Signed   By: Myles Rosenthal   On: 06/18/2013 20:40   Dg Esophagus  06/18/2013   CLINICAL DATA:  Dysphagia. Patient reports that food and pills sticking in the esophagus.  EXAM: ESOPHOGRAM/BARIUM SWALLOW  TECHNIQUE: Single contrast examination performed using thin barium liquid given the patient's limited mobility.  COMPARISON:  Chest CT 03/21/2013.  FLUOROSCOPY TIME:  Twenty-eight seconds.  FINDINGS: Study was performed in the supine semi-erect position. Mucosal assessment is limited by the patient's limited mobility and inability to obtain air contrast views. The primary stripping wave is fairly well maintained. There is no evidence of stricture, mass or ulceration. There is a small hiatal hernia. A barium tablet passed without delay into the stomach. No significant reflux was elicited with the water siphon test.  IMPRESSION: Small hiatal hernia. No evidence of esophageal stricture or ulceration.   Electronically Signed   By: Roxy Horseman   On: 06/18/2013 13:01    Microbiology: Recent Results (from the past 240 hour(s))  URINE CULTURE     Status: None   Collection Time    06/13/13  5:35 PM      Result Value Range Status   Specimen Description URINE, RANDOM   Final   Special Requests NONE   Final   Culture  Setup Time     Final   Value: 06/14/2013 02:53     Performed at [facility] Count     Final   Value: >=100,000 COLONIES/ML     Performed at  [facility]   Culture     Final   Value: KLEBSIELLA  PNEUMONIAE     Note: THIS ISOLATE HAS PREVIOUSLY BEEN CONFIRMED AS A [facility] CARBAPENEMASE PRODUCER CRITICAL RESULT CALLED TO, READ BACK BY AND VERIFIED WITH: JANET GREENE@2 :45PM ON 06/15/13 BY DANTS     Performed at [facility]   Report Status 06/16/2013 FINAL   Final   Organism ID, Bacteria KLEBSIELLA PNEUMONIAE   Final  URINE CULTURE     Status: None   Collection Time    06/17/13  7:20 PM      Result Value Range Status   Specimen Description URINE, CLEAN CATCH   Final   Special Requests NONE   Final   Culture  Setup Time     Final   Value: 06/17/2013 21:27     Performed at [facility] Count     Final   Value: >=100,000 COLONIES/ML     Performed at [facility]   Culture     Final   Value: GRAM NEGATIVE RODS     Performed at [facility]   Report Status PENDING   Incomplete     Labs: Basic Metabolic Panel:  Recent Labs Lab 06/13/13 1714 06/17/13 1741 06/18/13 0645 06/18/13 1809 06/19/13 1022  NA 135 134* 140  --  137  K 4.1 3.5 3.0*  --  3.3*  CL 87* 85* 89*  --  89*  CO2 41* 38* 41*  --  40*  GLUCOSE 257* 229* 176*  --  279*  BUN 75* 68* 64*  --  54*  CREATININE 2.88* 2.87* 2.73*  --  2.20*  CALCIUM 9.8 9.8 9.7  --  9.7  MG  --   --   --  1.8  --    Liver Function Tests:  Recent Labs Lab 06/19/13 1022  PROT 6.7   No results found for this basename: LIPASE, AMYLASE,  in the last 168 hours No results found for this basename: AMMONIA,  in the last 168 hours CBC:  Recent Labs Lab 06/13/13 1714 06/17/13 1741 06/18/13 0645  WBC 5.1 6.0 5.6  NEUTROABS 3.8 4.7  --   HGB 11.2* 11.7* 10.5*  HCT 34.6* 35.3* 33.0*  MCV 88.7 86.5 87.8  PLT 107* 101* 96*   Cardiac Enzymes:  Recent Labs Lab 06/17/13 1741 06/17/13 2358 06/18/13 0645 06/18/13 1112  TROPONINI <0.30 <0.30 <0.30 <0.30   BNP: BNP (last 3 results)  Recent Labs  03/19/13 2123 05/03/13 2223  06/13/13 1714  PROBNP 4611.0* 4188.0* 3655.0*   CBG:  Recent Labs Lab 06/18/13 0742 06/18/13 1650 06/18/13 2136 06/19/13 0758 06/19/13 1226  GLUCAP 159* 207* 209* 115* 284*       Signed:  HERNANDEZ ACOSTA,ESTELA  [facility] Pager: [phone] 06/19/2013, 12:32 PM

## 2013-06-19 NOTE — Progress Notes (Signed)
   CARE MANAGEMENT NOTE 06/19/2013  Patient:  [facility]   Account Number:  [id number]  Date Initiated:  06/18/2013  Documentation initiated by:  Letha Cape  Subjective/Objective Assessment:   dx weakness, pl effusions, chf , afib  admit- lives with daughtere     Action/Plan:   pt eval   Anticipated DC Date:  06/21/2013   Anticipated DC Plan:  HOME W HOME HEALTH SERVICES      DC Planning Services  CM consult      [facility] Choice  Resumption Of Svcs/PTA Provider   Choice offered to / List presented to:          [facility] arranged  HH-1 RN      [facility] agency  [facility]   Status of service:  Completed, signed off Medicare Important Message given?   (If response is "NO", the following Medicare IM given date fields will be blank) Date Medicare IM given:   Date Additional Medicare IM given:    Discharge Disposition:  HOME W HOME HEALTH SERVICES  Per UR Regulation:  Reviewed for med. necessity/level of care/duration of stay  If discussed at Long Length of Stay Meetings, dates discussed:    Comments:  06/19/2013 1820 NCM will follow up with [facility] for services prior to admit. Faxed referral of pts dc home today with resumption of care. Isidoro Donning RN CCM Case Mgmt phone [phone]  06/18/13 16:46 Letha Cape RN, BSN [phone] patient lives with daughter, await pt eval.  NCM will continue to follow for dc needs.

## 2013-06-19 NOTE — Progress Notes (Addendum)
Patient discharge teaching given, including activity, diet, follow-up appoints, and medications. Patient verbalized understanding of all discharge instructions. IV access was d/c'd. Vitals are stable. Skin is intact except as charted in most recent assessments. Pt to be escorted out by RN and daughter, to be driven home by family.  Peri Maris, MBA, BS, RN

## 2013-06-19 NOTE — Progress Notes (Signed)
CRITICAL VALUE ALERT  Critical value received:  CO2 = 40   Date of notification:  06-19-2013  Time of notification:  12:15  Critical value read back:yes  Nurse who received alert:  Melina Schools, RN  MD notified (1st page):  Ardyth Harps  Time of first notification:  12:20  Responding MD:  Ardyth Harps  Time MD responded:  12:20

## 2013-06-21 LAB — URINE CULTURE

## 2013-07-01 ENCOUNTER — Telehealth: Payer: Self-pay | Admitting: *Deleted

## 2013-07-01 ENCOUNTER — Ambulatory Visit (INDEPENDENT_AMBULATORY_CARE_PROVIDER_SITE_OTHER): Payer: Medicare Other | Admitting: Cardiology

## 2013-07-01 DIAGNOSIS — Z7901 Long term (current) use of anticoagulants: Secondary | ICD-10-CM

## 2013-07-01 LAB — POCT INR: INR: 3

## 2013-07-01 NOTE — Telephone Encounter (Signed)
Orders given for nurse to check INR today, Admit date: 06/17/2013 Discharge date: 06/19/2013 for generalized weakness.

## 2013-07-01 NOTE — Telephone Encounter (Signed)
[facility] with [facility] is going out to see pt at 1:00 today she needs another order for this visit

## 2013-07-01 NOTE — Telephone Encounter (Signed)
See anticoag encounter

## 2013-07-01 NOTE — Telephone Encounter (Signed)
INR 3.0 PT 36.4  5 MG Tuesday  2.5MG  ALL OTHER

## 2013-07-05 ENCOUNTER — Encounter (HOSPITAL_COMMUNITY): Payer: Self-pay | Admitting: *Deleted

## 2013-07-05 ENCOUNTER — Inpatient Hospital Stay (HOSPITAL_COMMUNITY)
Admission: EM | Admit: 2013-07-05 | Discharge: 2013-07-07 | DRG: 683 | Disposition: A | Discharge status: Home Health | Payer: Medicare Other | Attending: Internal Medicine | Admitting: Internal Medicine

## 2013-07-05 ENCOUNTER — Emergency Department (HOSPITAL_COMMUNITY): Payer: Medicare Other

## 2013-07-05 DIAGNOSIS — Z7901 Long term (current) use of anticoagulants: Secondary | ICD-10-CM

## 2013-07-05 DIAGNOSIS — N179 Acute kidney failure, unspecified: Principal | ICD-10-CM

## 2013-07-05 DIAGNOSIS — I130 Hypertensive heart and chronic kidney disease with heart failure and stage 1 through stage 4 chronic kidney disease, or unspecified chronic kidney disease: Secondary | ICD-10-CM | POA: Diagnosis present

## 2013-07-05 DIAGNOSIS — I482 Chronic atrial fibrillation, unspecified: Secondary | ICD-10-CM | POA: Diagnosis present

## 2013-07-05 DIAGNOSIS — K219 Gastro-esophageal reflux disease without esophagitis: Secondary | ICD-10-CM

## 2013-07-05 DIAGNOSIS — I5032 Chronic diastolic (congestive) heart failure: Secondary | ICD-10-CM | POA: Diagnosis present

## 2013-07-05 DIAGNOSIS — R531 Weakness: Secondary | ICD-10-CM

## 2013-07-05 DIAGNOSIS — D638 Anemia in other chronic diseases classified elsewhere: Secondary | ICD-10-CM

## 2013-07-05 DIAGNOSIS — J449 Chronic obstructive pulmonary disease, unspecified: Secondary | ICD-10-CM

## 2013-07-05 DIAGNOSIS — F329 Major depressive disorder, single episode, unspecified: Secondary | ICD-10-CM

## 2013-07-05 DIAGNOSIS — Z96659 Presence of unspecified artificial knee joint: Secondary | ICD-10-CM

## 2013-07-05 DIAGNOSIS — I5033 Acute on chronic diastolic (congestive) heart failure: Secondary | ICD-10-CM

## 2013-07-05 DIAGNOSIS — M129 Arthropathy, unspecified: Secondary | ICD-10-CM | POA: Diagnosis present

## 2013-07-05 DIAGNOSIS — F411 Generalized anxiety disorder: Secondary | ICD-10-CM

## 2013-07-05 DIAGNOSIS — N390 Urinary tract infection, site not specified: Secondary | ICD-10-CM

## 2013-07-05 DIAGNOSIS — Z228 Carrier of other infectious diseases: Secondary | ICD-10-CM

## 2013-07-05 DIAGNOSIS — E669 Obesity, unspecified: Secondary | ICD-10-CM

## 2013-07-05 DIAGNOSIS — R0602 Shortness of breath: Secondary | ICD-10-CM

## 2013-07-05 DIAGNOSIS — N19 Unspecified kidney failure: Secondary | ICD-10-CM

## 2013-07-05 DIAGNOSIS — Z794 Long term (current) use of insulin: Secondary | ICD-10-CM

## 2013-07-05 DIAGNOSIS — D696 Thrombocytopenia, unspecified: Secondary | ICD-10-CM | POA: Diagnosis present

## 2013-07-05 DIAGNOSIS — E119 Type 2 diabetes mellitus without complications: Secondary | ICD-10-CM

## 2013-07-05 DIAGNOSIS — M549 Dorsalgia, unspecified: Secondary | ICD-10-CM

## 2013-07-05 DIAGNOSIS — R627 Adult failure to thrive: Secondary | ICD-10-CM

## 2013-07-05 DIAGNOSIS — R5381 Other malaise: Secondary | ICD-10-CM

## 2013-07-05 DIAGNOSIS — F32A Depression, unspecified: Secondary | ICD-10-CM

## 2013-07-05 DIAGNOSIS — J309 Allergic rhinitis, unspecified: Secondary | ICD-10-CM

## 2013-07-05 DIAGNOSIS — G4733 Obstructive sleep apnea (adult) (pediatric): Secondary | ICD-10-CM

## 2013-07-05 DIAGNOSIS — I1310 Hypertensive heart and chronic kidney disease without heart failure, with stage 1 through stage 4 chronic kidney disease, or unspecified chronic kidney disease: Secondary | ICD-10-CM | POA: Diagnosis present

## 2013-07-05 DIAGNOSIS — J90 Pleural effusion, not elsewhere classified: Secondary | ICD-10-CM

## 2013-07-05 DIAGNOSIS — D649 Anemia, unspecified: Secondary | ICD-10-CM | POA: Diagnosis present

## 2013-07-05 DIAGNOSIS — J4489 Other specified chronic obstructive pulmonary disease: Secondary | ICD-10-CM | POA: Diagnosis present

## 2013-07-05 DIAGNOSIS — N184 Chronic kidney disease, stage 4 (severe): Secondary | ICD-10-CM | POA: Diagnosis present

## 2013-07-05 DIAGNOSIS — I10 Essential (primary) hypertension: Secondary | ICD-10-CM

## 2013-07-05 DIAGNOSIS — E1169 Type 2 diabetes mellitus with other specified complication: Secondary | ICD-10-CM | POA: Diagnosis present

## 2013-07-05 DIAGNOSIS — N189 Chronic kidney disease, unspecified: Secondary | ICD-10-CM | POA: Diagnosis present

## 2013-07-05 DIAGNOSIS — G4700 Insomnia, unspecified: Secondary | ICD-10-CM

## 2013-07-05 DIAGNOSIS — Z515 Encounter for palliative care: Secondary | ICD-10-CM

## 2013-07-05 DIAGNOSIS — I509 Heart failure, unspecified: Secondary | ICD-10-CM | POA: Diagnosis present

## 2013-07-05 DIAGNOSIS — R739 Hyperglycemia, unspecified: Secondary | ICD-10-CM

## 2013-07-05 DIAGNOSIS — I4891 Unspecified atrial fibrillation: Secondary | ICD-10-CM | POA: Diagnosis present

## 2013-07-05 DIAGNOSIS — E876 Hypokalemia: Secondary | ICD-10-CM | POA: Diagnosis present

## 2013-07-05 LAB — BLOOD GAS, ARTERIAL
Acid-Base Excess: 10.8 mmol/L — ABNORMAL HIGH (ref 0.0–2.0)
Drawn by: 34767
FIO2: 0.28 %
O2 Saturation: 94.6 %
Patient temperature: 98.6
pO2, Arterial: 72.8 mmHg — ABNORMAL LOW (ref 80.0–100.0)

## 2013-07-05 LAB — URINE MICROSCOPIC-ADD ON

## 2013-07-05 LAB — BASIC METABOLIC PANEL
Calcium: 9.5 mg/dL (ref 8.4–10.5)
GFR calc non Af Amer: 10 mL/min — ABNORMAL LOW (ref >90)
Potassium: 4.1 mEq/L (ref 3.5–5.1)
Sodium: 133 mEq/L — ABNORMAL LOW (ref 135–145)

## 2013-07-05 LAB — URINALYSIS, ROUTINE W REFLEX MICROSCOPIC
Glucose, UA: NEGATIVE mg/dL
Ketones, ur: NEGATIVE mg/dL
Nitrite: NEGATIVE
Specific Gravity, Urine: 1.011 (ref 1.005–1.030)
pH: 5.5 (ref 5.0–8.0)

## 2013-07-05 LAB — PRO B NATRIURETIC PEPTIDE: Pro B Natriuretic peptide (BNP): 3388 pg/mL — ABNORMAL HIGH (ref 0–450)

## 2013-07-05 LAB — CBC
Hemoglobin: 11.5 g/dL — ABNORMAL LOW (ref 12.0–15.0)
Platelets: 110 10*3/uL — ABNORMAL LOW (ref 150–400)
RBC: 4.07 MIL/uL (ref 3.87–5.11)
WBC: 5.2 10*3/uL (ref 4.0–10.5)

## 2013-07-05 LAB — GLUCOSE, CAPILLARY: Glucose-Capillary: 150 mg/dL — ABNORMAL HIGH (ref 70–99)

## 2013-07-05 MED ORDER — DEXTROSE 50 % IV SOLN: 50.0000 mL | Freq: Once | INTRAVENOUS | Status: AC | PRN

## 2013-07-05 MED ORDER — MELATONIN 3 MG PO CAPS
1.0000 | ORAL_CAPSULE | Freq: Every day | ORAL | Status: DC
Start: 2013-07-05 — End: 2013-07-05

## 2013-07-05 MED ORDER — CARVEDILOL 25 MG PO TABS
25.0000 mg | ORAL_TABLET | Freq: Two times a day (BID) | ORAL | Status: DC
  Administered 2013-07-06: 25 mg via ORAL
  Filled 2013-07-05 (×3): qty 1

## 2013-07-05 MED ORDER — DEXTROSE 5 % IV SOLN
1.0000 g | Freq: Once | INTRAVENOUS | Status: AC
  Administered 2013-07-05: 1 g via INTRAVENOUS
  Filled 2013-07-05: qty 10

## 2013-07-05 MED ORDER — SODIUM CHLORIDE 0.9 % IV SOLN
INTRAVENOUS | Status: DC
  Administered 2013-07-05: 21:00:00 via INTRAVENOUS

## 2013-07-05 MED ORDER — ALLOPURINOL 100 MG PO TABS
100.0000 mg | ORAL_TABLET | Freq: Every day | ORAL | Status: DC
  Administered 2013-07-06 – 2013-07-07 (×2): 100 mg via ORAL
  Filled 2013-07-05 (×2): qty 1

## 2013-07-05 MED ORDER — INSULIN ASPART 100 UNIT/ML ~~LOC~~ SOLN
0.0000 [IU] | Freq: Three times a day (TID) | SUBCUTANEOUS | Status: DC
  Administered 2013-07-07: 4 [IU] via SUBCUTANEOUS

## 2013-07-05 MED ORDER — PANTOPRAZOLE SODIUM 40 MG PO TBEC
80.0000 mg | DELAYED_RELEASE_TABLET | Freq: Every day | ORAL | Status: DC
  Administered 2013-07-06 – 2013-07-07 (×2): 80 mg via ORAL
  Filled 2013-07-05 (×2): qty 2

## 2013-07-05 MED ORDER — DEXTROSE 50 % IV SOLN: 25.0000 mL | Freq: Once | INTRAVENOUS | Status: AC | PRN

## 2013-07-05 MED ORDER — NYSTATIN 100000 UNIT/ML MT SUSP
5.0000 mL | Freq: Four times a day (QID) | OROMUCOSAL | Status: DC
  Administered 2013-07-05 – 2013-07-06 (×2): 500000 [IU] via ORAL
  Filled 2013-07-05 (×5): qty 5

## 2013-07-05 MED ORDER — POLYETHYLENE GLYCOL 3350 17 G PO PACK
17.0000 g | PACK | Freq: Two times a day (BID) | ORAL | Status: DC
  Administered 2013-07-06 – 2013-07-07 (×3): 17 g via ORAL
  Filled 2013-07-05 (×5): qty 1

## 2013-07-05 MED ORDER — TRAZODONE HCL 50 MG PO TABS
50.0000 mg | ORAL_TABLET | Freq: Every day | ORAL | Status: DC
  Administered 2013-07-05 – 2013-07-06 (×2): 50 mg via ORAL
  Filled 2013-07-05 (×3): qty 1

## 2013-07-05 MED ORDER — BUSPIRONE HCL 5 MG PO TABS
5.0000 mg | ORAL_TABLET | Freq: Two times a day (BID) | ORAL | Status: DC
  Administered 2013-07-05 – 2013-07-07 (×4): 5 mg via ORAL
  Filled 2013-07-05 (×5): qty 1

## 2013-07-05 MED ORDER — AMIODARONE HCL 200 MG PO TABS
400.0000 mg | ORAL_TABLET | Freq: Every day | ORAL | Status: DC
  Administered 2013-07-06 – 2013-07-07 (×2): 400 mg via ORAL
  Filled 2013-07-05 (×2): qty 2

## 2013-07-05 MED ORDER — GLUCOSE 40 % PO GEL: 1.0000 | ORAL | Status: DC | PRN

## 2013-07-05 MED ORDER — FLUTICASONE PROPIONATE 50 MCG/ACT NA SUSP
1.0000 | Freq: Every day | NASAL | Status: DC
  Administered 2013-07-06 – 2013-07-07 (×2): 1 via NASAL
  Filled 2013-07-05: qty 16

## 2013-07-05 MED ORDER — ONDANSETRON HCL 4 MG PO TABS: 4.0000 mg | ORAL_TABLET | Freq: Four times a day (QID) | ORAL | Status: DC | PRN

## 2013-07-05 MED ORDER — DOCUSATE SODIUM 100 MG PO CAPS
100.0000 mg | ORAL_CAPSULE | Freq: Three times a day (TID) | ORAL | Status: DC
  Administered 2013-07-06 – 2013-07-07 (×4): 100 mg via ORAL
  Filled 2013-07-05 (×8): qty 1

## 2013-07-05 MED ORDER — ACETAMINOPHEN 325 MG PO TABS
650.0000 mg | ORAL_TABLET | Freq: Four times a day (QID) | ORAL | Status: DC | PRN
  Administered 2013-07-06 – 2013-07-07 (×2): 650 mg via ORAL
  Filled 2013-07-05 (×2): qty 2

## 2013-07-05 MED ORDER — GUAIFENESIN ER 600 MG PO TB12
600.0000 mg | ORAL_TABLET | Freq: Two times a day (BID) | ORAL | Status: DC
  Administered 2013-07-05 – 2013-07-07 (×4): 600 mg via ORAL
  Filled 2013-07-05 (×5): qty 1

## 2013-07-05 MED ORDER — SODIUM CHLORIDE 0.9 % IJ SOLN
3.0000 mL | Freq: Two times a day (BID) | INTRAMUSCULAR | Status: DC
  Administered 2013-07-06 – 2013-07-07 (×2): 3 mL via INTRAVENOUS

## 2013-07-05 NOTE — Progress Notes (Signed)
PHARMACIST - PHYSICIAN ORDER COMMUNICATION  CONCERNING: P&T Medication Policy on Herbal Medications  DESCRIPTION:  This patient's order for:  Melatonin Caps 3mg   has been noted.  This product(s) is classified as an "herbal" or natural product. Due to a lack of definitive safety studies or FDA approval, nonstandard manufacturing practices, plus the potential risk of unknown drug-drug interactions while on inpatient medications, the Pharmacy and Therapeutics Committee does not permit the use of "herbal" or natural products of this type within [facility].   ACTION TAKEN: The pharmacy department is unable to verify this order at this time and your patient has been informed of this safety policy. Please reevaluate patient's clinical condition at discharge and address if the herbal or natural product(s) should be resumed at that time.

## 2013-07-05 NOTE — ED Notes (Addendum)
Pt with hx of acute renal failure and chf to ED c/o increased weight gain and sob.  States she fills she is retaining fluid in her lower extremities and in her abdomen.  Denies chest pain.  Sats of 88% on RA.

## 2013-07-05 NOTE — H&P (Signed)
[facility] History and Physical  Gina Ashley WUJ:[id number] DOB: 1927/08/21 DOA: 07/05/2013  Referring physician: ED PCP: Kimber Relic, MD  Chief Complaint: Generalized weakness  HPI: Gina Ashley is a 79 y.o. female with pmh of CHF, cardio-renal syndrome, UTI with KPC for which she was just discharged some 15 days ago, who presents to the ED with worsening generalized weakness for the past couple of days.  This is characterized by decreasing ability to move about from her bed to the wheelchair, and get to the commode. She denies fever, chills, chest pain, shortness of breath at rest, nausea, vomiting, or constipation. She has mild dyspnea on exertion. She has urinary hesitancy without dysuria, urinary frequency, or hematuria. She has been eating well. There are no other known modifying factors.  In the ED she was found to be in AKI on CKD with creatinine of 3.9 (2.2), BUN 99 (54), she has a R pleural effusion as well.  Hospitalist asked to admit.  Review of Systems: 12 systems reviewed and otherwise negative.  Past Medical History  Diagnosis Date  . GERD (gastroesophageal reflux disease)   . Hiatal hernia   . HTN (hypertension)     all her life  . Atrial fibrillation     since 1996  . Depression   . Chronic diastolic heart failure 08/31/2012  . Tibia fracture     BILATERAL  . Respiratory failure     acute on chronic hx of  requiring BIPAP  . COPD (chronic obstructive pulmonary disease)   . Metabolic encephalopathy     hx of   . UTI (urinary tract infection)     hx of   . Pleural effusion     right sided now resolved   . CHF (congestive heart failure)     diastolic HF  . Complication of anesthesia     "hard to wake up" (03/11/2013)  . OSA on CPAP   . Type II diabetes mellitus 1980's?  . History of blood transfusion 08/2012    "related to leg OR" (03/11/2013)  . Arthritis     "left arm; lower back, hips, hands" (03/11/2013)  . Chronic lower back pain     "disc pops  in and out" (03/11/2013)  . Gout   . Anxiety   . Fall 08/2012    "in nursing home" (03/11/2013)  . Anemia   . Chronic a-fib 03/12/2013  . CKD (chronic kidney disease), stage III     GFR: 38  . Renal failure     acute on chronic   . Renal failure     acute on chronic with need for intermittent dialysis - hx of    Past Surgical History  Procedure Laterality Date  . Bunionectomy Bilateral     bilateral  . Cholecystectomy    . Shoulder arthroscopy w/ rotator cuff repair Left 2002  . Total knee arthroplasty Bilateral 2001  . Hammer toe surgery Bilateral 2004  . Colonoscopy  07/11/2011    Procedure: COLONOSCOPY;  Surgeon: Malissa Hippo, MD;  [facility]: [facility];  Service: Endoscopy;  Laterality: N/A;  3:00  . Orif periprosthetic fracture  09/08/2012    Procedure: OPEN REDUCTION INTERNAL FIXATION (ORIF) PERIPROSTHETIC FRACTURE;  Surgeon: Shelda Pal, MD;  Location: [facility];  Service: Orthopedics;  Laterality: Right;  ORIF periprosthetic right proximal femur fracture   . External fixation leg  09/08/2012    Procedure: EXTERNAL FIXATION LEG;  Surgeon: Shelda Pal, MD;  Location: [facility];  Service: Orthopedics;  Laterality: Left;  . Joint replacement    . External fixation leg  11/30/2012    Procedure: EXTERNAL FIXATION LEG;  Surgeon: Shelda Pal, MD;  Location: [facility];  Service: Orthopedics;  Laterality: Left;  Removal of External Fixation Left Knee with Evaluation with Floroscopy  . Tee without cardioversion N/A 01/19/2013    Procedure: TRANSESOPHAGEAL ECHOCARDIOGRAM (TEE);  Surgeon: Lewayne Bunting, MD;  Location: [facility] ENDOSCOPY;  Service: Cardiovascular;  Laterality: N/A;  . Shoulder open rotator cuff repair Left 2002    "maybe 3 months after scope" (03/11/2013)  . Femur fracture surgery Right 08/2012    "fell at nursing home" (03/11/2013)  . Tibia fracture surgery Left 08/2012    "fell at nursing home" (03/11/2013)   Social History:  reports that she has never smoked. She has never used  smokeless tobacco. She reports that she does not drink alcohol or use illicit drugs.   Allergies  Allergen Reactions  . Morphine Sulfate Other (See Comments)    REACTION: change in personality  . Remeron [Mirtazapine] Other (See Comments)    Altered mental status, lethargy  . [place]cott Gum [Fish Allergy] Other (See Comments)    unknown  . Penicillins Rash    Tolerates Ancef.    Family History  Problem Relation Age of Onset  . Colon cancer Brother      Prior to Admission medications   Medication Sig Start Date End Date Taking? Authorizing Provider  acetaminophen (TYLENOL) 325 MG tablet Take 650 mg by mouth every 6 (six) hours as needed for pain.   Yes Historical Provider, MD  allopurinol (ZYLOPRIM) 100 MG tablet Take 100 mg by mouth daily.   Yes Historical Provider, MD  ALPRAZolam (XANAX) 0.25 MG tablet Take 0.125 mg by mouth every 8 (eight) hours as needed for anxiety.   Yes Historical Provider, MD  amiodarone (PACERONE) 200 MG tablet Take 400 mg by mouth daily.   Yes Historical Provider, MD  busPIRone (BUSPAR) 5 MG tablet Take 5 mg by mouth every 12 (twelve) hours.    Yes Historical Provider, MD  carvedilol (COREG) 25 MG tablet Take 25 mg by mouth 2 (two) times daily with a meal.   Yes Historical Provider, MD  docusate sodium (COLACE) 100 MG capsule Take 100 mg by mouth every 8 (eight) hours.    Yes Historical Provider, MD  fluticasone (FLONASE) 50 MCG/ACT nasal spray Place 1 spray into the nose daily.   Yes Historical Provider, MD  guaiFENesin (MUCINEX) 600 MG 12 hr tablet Take 600 mg by mouth 2 (two) times daily.    Yes Historical Provider, MD  HYDROcodone-acetaminophen (NORCO) 7.5-325 MG per tablet Take 1 tablet by mouth at bedtime as needed for pain.   Yes Historical Provider, MD  insulin aspart (NOVOLOG) 100 UNIT/ML injection Inject 5 Units into the skin 3 (three) times daily with meals. If cbg is greater than 150 09/15/12  Yes Lesle Chris Black, NP  insulin glargine (LANTUS) 100 UNIT/ML  injection Inject 5 Units into the skin at bedtime.   Yes Historical Provider, MD  metolazone (ZAROXOLYN) 2.5 MG tablet Take 2.5 mg every Wednesday or if weight greater than 205 lbs 06/09/13  Yes Aundria Rud, NP  nitroGLYCERIN (NITROSTAT) 0.4 MG SL tablet Place 0.4 mg under the tongue every 5 (five) minutes as needed for chest pain.   Yes Historical Provider, MD  nystatin (MYCOSTATIN) 100000 UNIT/ML suspension Take 5 mL (500,000 Units total) by mouth 4 (four) times daily. 06/19/13  Yes Henderson Cloud, MD  omeprazole (PRILOSEC) 20 MG capsule Take 40 mg by mouth daily.    Yes Historical Provider, MD  ondansetron (ZOFRAN) 4 MG tablet Take 1 tablet (4 mg total) by mouth every 6 (six) hours as needed for nausea. 02/12/13  Yes Shanker Levora Dredge, MD  polyethylene glycol (MIRALAX / GLYCOLAX) packet Take 17 g by mouth 2 (two) times daily.    Yes Historical Provider, MD  torsemide (DEMADEX) 20 MG tablet Take 4 tablets (80 mg total) by mouth 2 (two) times daily. 05/01/13  Yes Tiffany L Reed, DO  traZODone (DESYREL) 50 MG tablet Take 50 mg by mouth at bedtime.   Yes Historical Provider, MD  warfarin (COUMADIN) 5 MG tablet Take 2.5 mg by mouth daily.   Yes Historical Provider, MD  Melatonin 3 MG CAPS Take 1 capsule by mouth at bedtime.    Historical Provider, MD   Physical Exam: Filed Vitals:   07/05/13 2138  BP: 148/76  Pulse: 87  Temp: 97.8 F (36.6 C)  Resp: 22    General:  NAD, resting comfortably in bed Eyes: PEERLA EOMI ENT: mucous membranes moist Neck: supple w/o JVD Cardiovascular: RRR w/o MRG Respiratory: BS diminished on the R side Abdomen: soft, nt, nd, bs+ Skin: no rash nor lesion Musculoskeletal: MAE, full ROM all 4 extremities Psychiatric: normal tone and affect Neurologic: AAOx3, grossly non-focal  Labs on Admission:  Basic Metabolic Panel:  Recent Labs Lab 07/05/13 1448  NA 133*  K 4.1  CL 88*  CO2 31  GLUCOSE 233*  BUN 99*  CREATININE 3.90*  CALCIUM 9.5    Liver Function Tests: No results found for this basename: AST, ALT, ALKPHOS, BILITOT, PROT, ALBUMIN,  in the last 168 hours No results found for this basename: LIPASE, AMYLASE,  in the last 168 hours No results found for this basename: AMMONIA,  in the last 168 hours CBC:  Recent Labs Lab 07/05/13 1448  WBC 5.2  HGB 11.5*  HCT 35.3*  MCV 86.7  PLT 110*   Cardiac Enzymes: No results found for this basename: CKTOTAL, CKMB, CKMBINDEX, TROPONINI,  in the last 168 hours  BNP (last 3 results)  Recent Labs  05/03/13 2223 06/13/13 1714 07/05/13 1448  PROBNP 4188.0* 3655.0* 3388.0*   CBG: No results found for this basename: GLUCAP,  in the last 168 hours  Radiological Exams on Admission: Dg Chest Port 1 View  07/05/2013   *RADIOLOGY REPORT*  Clinical Data: Shortness of breath and leg swelling  PORTABLE CHEST - 1 VIEW  Comparison: 06/18/2013 and multiple prior chest radiographs  Findings: Cardiomegaly, right pleural effusion and increasing right lower lung atelectasis noted. The left lung is clear.  There is no evidence of pneumothorax. No acute bony abnormalities are present.  IMPRESSION: Cardiomegaly with right pleural effusion and increasing right lower lung atelectasis.   Original Report Authenticated By: Harmon Pier, M.D.    EKG: Independently reviewed.  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 AKI on CKD - strict intake and output, have consulted nephrology and Dr. Lacy Duverney has recommended holding torsemide (have ordered this).  Do not feel she is pre-renal nor dehydrated so will not give additional IVF at this point in time.  Generalized weakness and mild AMS is likely secondary to uremia.  Despite drop in bicarb her  pH is normal at this time on ABG.  Cause of AKI unfortunately is likely to be cardiorenal syndrome which has caused kidney injury and loss requiring dialysis in past, patient did very poorly on dialysis in December and so this had been stopped. 2. DM2 - monitor BGLs and add insulin if needed, on VERY low dose home insulin (5 of lantus and 5 unit SSI only) so wouldn't be surprised with the new AKI if she did not require insulin at this time. 3. KPC in urine - no systemic signs of infection, no SIRS and only mildly elevated WBC in urine, will hold off on treatment for now as tetracycline / tigacycline is likely the only antibiotic that this will be sensitive to. 4. Chronic A.Fib - tele monitor and continue home coumadin.    Code Status: Full Code (must indicate code status--if unknown or must be presumed, indicate so) Family Communication: spoke with daughter at bedside (indicate person spoken with, if applicable, with phone number if by telephone) Disposition Plan: Admit to inpatient (indicate anticipated LOS)  Time spent: 70 min  GARDNER, JARED M. [facility] Pager [phone]  If 7PM-7AM, please contact night-coverage [email or web address] Password [email or web address] 07/05/2013, 11:13 PM

## 2013-07-05 NOTE — ED Provider Notes (Signed)
CSN: [id number]     Arrival date & time 07/05/13  1438 History   First MD Initiated Contact with Patient 07/05/13 1501     Chief Complaint  Patient presents with  . Leg Swelling  . Shortness of Breath   (Consider location/radiation/quality/duration/timing/severity/associated sxs/prior Treatment) HPI Comments: Gina Ashley is a 79 y.o. Female who presents for evaluation of weakness. The weakness started several days ago and is worsening. This is characterized by decreasing ability to move about from her bed to the wheelchair, and get to the commode. She denies fever, chills, chest pain, shortness of breath at rest, nausea, vomiting, or constipation. She has mild dyspnea on exertion. She has urinary hesitancy without dysuria, urinary frequency, or hematuria. She has been eating well. There are no other known modifying factors.   Patient is a 79 y.o. female presenting with shortness of breath. The history is provided by the patient.  Shortness of Breath   Past Medical History  Diagnosis Date  . GERD (gastroesophageal reflux disease)   . Hiatal hernia   . HTN (hypertension)     all her life  . Atrial fibrillation     since 1996  . Depression   . Chronic diastolic heart failure 08/31/2012  . Tibia fracture     BILATERAL  . Respiratory failure     acute on chronic hx of  requiring BIPAP  . COPD (chronic obstructive pulmonary disease)   . Metabolic encephalopathy     hx of   . UTI (urinary tract infection)     hx of   . Pleural effusion     right sided now resolved   . CHF (congestive heart failure)     diastolic HF  . Complication of anesthesia     "hard to wake up" (03/11/2013)  . OSA on CPAP   . Type II diabetes mellitus 1980's?  . History of blood transfusion 08/2012    "related to leg OR" (03/11/2013)  . Arthritis     "left arm; lower back, hips, hands" (03/11/2013)  . Chronic lower back pain     "disc pops in and out" (03/11/2013)  . Gout   . Anxiety   . Fall 08/2012    "in  nursing home" (03/11/2013)  . Anemia   . Chronic a-fib 03/12/2013  . CKD (chronic kidney disease), stage III     GFR: 38  . Renal failure     acute on chronic   . Renal failure     acute on chronic with need for intermittent dialysis - hx of    Past Surgical History  Procedure Laterality Date  . Bunionectomy Bilateral     bilateral  . Cholecystectomy    . Shoulder arthroscopy w/ rotator cuff repair Left 2002  . Total knee arthroplasty Bilateral 2001  . Hammer toe surgery Bilateral 2004  . Colonoscopy  07/11/2011    Procedure: COLONOSCOPY;  Surgeon: Malissa Hippo, MD;  [facility]: [facility];  Service: Endoscopy;  Laterality: N/A;  3:00  . Orif periprosthetic fracture  09/08/2012    Procedure: OPEN REDUCTION INTERNAL FIXATION (ORIF) PERIPROSTHETIC FRACTURE;  Surgeon: Shelda Pal, MD;  Location: [facility];  Service: Orthopedics;  Laterality: Right;  ORIF periprosthetic right proximal femur fracture   . External fixation leg  09/08/2012    Procedure: EXTERNAL FIXATION LEG;  Surgeon: Shelda Pal, MD;  Location: [facility];  Service: Orthopedics;  Laterality: Left;  . Joint replacement    . External fixation leg  11/30/2012    Procedure: EXTERNAL FIXATION LEG;  Surgeon: Shelda Pal, MD;  Location: [facility];  Service: Orthopedics;  Laterality: Left;  Removal of External Fixation Left Knee with Evaluation with Floroscopy  . Tee without cardioversion N/A 01/19/2013    Procedure: TRANSESOPHAGEAL ECHOCARDIOGRAM (TEE);  Surgeon: Lewayne Bunting, MD;  Location: [facility] ENDOSCOPY;  Service: Cardiovascular;  Laterality: N/A;  . Shoulder open rotator cuff repair Left 2002    "maybe 3 months after scope" (03/11/2013)  . Femur fracture surgery Right 08/2012    "fell at nursing home" (03/11/2013)  . Tibia fracture surgery Left 08/2012    "fell at nursing home" (03/11/2013)   Family History  Problem Relation Age of Onset  . Colon cancer Brother    History  Substance Use Topics  . Smoking status: Never Smoker    . Smokeless tobacco: Never Used  . Alcohol Use: No   OB History   Grav Para Term Preterm Abortions TAB SAB Ect Mult Living                 Review of Systems  Respiratory: Positive for shortness of breath.   All other systems reviewed and are negative.    Allergies  Morphine sulfate; Remeron; Tuna; and Penicillins  Home Medications   Current Outpatient Rx  Name  Route  Sig  Dispense  Refill  . acetaminophen (TYLENOL) 325 MG tablet   Oral   Take 650 mg by mouth every 6 (six) hours as needed for pain.         Marland Kitchen allopurinol (ZYLOPRIM) 100 MG tablet   Oral   Take 100 mg by mouth daily.         Marland Kitchen ALPRAZolam (XANAX) 0.25 MG tablet   Oral   Take 0.125 mg by mouth every 8 (eight) hours as needed for anxiety.         Marland Kitchen amiodarone (PACERONE) 200 MG tablet   Oral   Take 400 mg by mouth daily.         . busPIRone (BUSPAR) 5 MG tablet   Oral   Take 5 mg by mouth every 12 (twelve) hours.          . carvedilol (COREG) 25 MG tablet   Oral   Take 25 mg by mouth 2 (two) times daily with a meal.         . docusate sodium (COLACE) 100 MG capsule   Oral   Take 100 mg by mouth every 8 (eight) hours.          . fluticasone (FLONASE) 50 MCG/ACT nasal spray   Nasal   Place 1 spray into the nose daily.         Marland Kitchen guaiFENesin (MUCINEX) 600 MG 12 hr tablet   Oral   Take 600 mg by mouth 2 (two) times daily.          Marland Kitchen HYDROcodone-acetaminophen (NORCO) 7.5-325 MG per tablet   Oral   Take 1 tablet by mouth at bedtime as needed for pain.         Marland Kitchen insulin aspart (NOVOLOG) 100 UNIT/ML injection   Subcutaneous   Inject 5 Units into the skin 3 (three) times daily with meals. If cbg is greater than 150         . insulin glargine (LANTUS) 100 UNIT/ML injection   Subcutaneous   Inject 5 Units into the skin at bedtime.         . Melatonin 3 MG CAPS  Oral   Take 1 capsule by mouth at bedtime.         . metolazone (ZAROXOLYN) 2.5 MG tablet      Take 2.5 mg  every Wednesday or if weight greater than 205 lbs   15 tablet   6     Take 2.5 mg if her weight is 201 pounds or greater ...   . nitroGLYCERIN (NITROSTAT) 0.4 MG SL tablet   Sublingual   Place 0.4 mg under the tongue every 5 (five) minutes as needed for chest pain.         Marland Kitchen nystatin (MYCOSTATIN) 100000 UNIT/ML suspension   Oral   Take 5 mL (500,000 Units total) by mouth 4 (four) times daily.   60 mL   0   . omeprazole (PRILOSEC) 20 MG capsule   Oral   Take 40 mg by mouth daily.          . ondansetron (ZOFRAN) 4 MG tablet   Oral   Take 1 tablet (4 mg total) by mouth every 6 (six) hours as needed for nausea.   20 tablet      . polyethylene glycol (MIRALAX / GLYCOLAX) packet   Oral   Take 17 g by mouth 2 (two) times daily.          Marland Kitchen torsemide (DEMADEX) 20 MG tablet   Oral   Take 4 tablets (80 mg total) by mouth 2 (two) times daily.   60 tablet   0   . traZODone (DESYREL) 50 MG tablet   Oral   Take 50 mg by mouth at bedtime.         Marland Kitchen warfarin (COUMADIN) 5 MG tablet   Oral   Take 2.5 mg by mouth daily.          BP 111/55  Pulse 57  Temp(Src) 98 F (36.7 C) (Oral)  Resp 22  Ht 5\' 3"  (1.6 m)  Wt 197 lb 4.8 oz (89.495 kg)  BMI 34.96 kg/m2  SpO2 96% Physical Exam  Nursing note and vitals reviewed. Constitutional: She is oriented to person, place, and time. She appears well-developed.  Elderly, frail  HENT:  Head: Normocephalic and atraumatic.  Eyes: Conjunctivae and EOM are normal. Pupils are equal, round, and reactive to light.  Neck: Normal range of motion and phonation normal. Neck supple.  Cardiovascular: Normal rate, regular rhythm and intact distal pulses.   Pulmonary/Chest: Effort normal. No respiratory distress. She has no wheezes. She has no rales. She exhibits no tenderness.  Decreased breath sounds right mid to lower lung  Abdominal: Soft. She exhibits no distension. There is no tenderness. There is no guarding.  Musculoskeletal: Normal  range of motion. She exhibits edema (Mild bilateral lower extremity, symmetric).  Brace left leg for foot drop  Neurological: She is alert and oriented to person, place, and time. She has normal strength. She exhibits normal muscle tone.  Skin: Skin is warm and dry.  Psychiatric: She has a normal mood and affect. Her behavior is normal. Judgment and thought content normal.    ED Course  Procedures (including critical care time) Medications  cefTRIAXone (ROCEPHIN) 1 g in dextrose 5 % 50 mL IVPB (1 g Intravenous New Bag/Given 07/05/13 2007)  0.9 %  sodium chloride infusion (not administered)    Patient Vitals for the past 24 hrs:  BP Temp Temp src Pulse Resp SpO2 Height Weight  07/05/13 1945 111/55 mmHg - - 57 22 96 % - -  07/05/13 1930  111/62 mmHg - - 71 20 96 % - -  07/05/13 1915 113/68 mmHg - - 76 15 95 % - -  07/05/13 1900 115/77 mmHg - - 68 14 96 % - -  07/05/13 1845 141/74 mmHg - - 79 15 96 % - -  07/05/13 1830 132/82 mmHg - - 72 14 94 % - -  07/05/13 1815 106/74 mmHg - - 63 13 95 % - -  07/05/13 1800 113/66 mmHg - - 88 13 96 % - -  07/05/13 1745 115/66 mmHg - - 74 13 95 % - -  07/05/13 1730 115/70 mmHg - - 84 13 95 % - -  07/05/13 1715 121/81 mmHg - - 68 13 94 % - -  07/05/13 1700 118/79 mmHg - - 67 13 94 % - -  07/05/13 1645 127/80 mmHg - - 64 17 90 % - -  07/05/13 1630 115/72 mmHg - - 60 12 97 % - -  07/05/13 1615 117/69 mmHg - - 79 13 96 % - -  07/05/13 1600 112/65 mmHg - - 65 12 95 % - -  07/05/13 1545 129/92 mmHg - - 81 16 95 % - -  07/05/13 1530 130/78 mmHg - - 74 17 97 % - -  07/05/13 1515 122/77 mmHg - - 78 15 92 % - -  07/05/13 1449 - 98 F (36.7 C) Oral 80 16 96 % 5\' 3"  (1.6 m) 197 lb 4.8 oz (89.495 kg)    Nursing notes, applicable records and vitals reviewed.  7:57 PM Reevaluation with update and discussion. After initial assessment and treatment, an updated evaluation reveals vital signs are stable. Patient is comfortable now. According to the patient's  daughter, she does not see nephrology, regularly. They have been no plans for dialysis.Mancel Bale L    8:01 PM-Consult complete with Hospitalist. Patient case explained and discussed. He agrees to admit patient for further evaluation and treatment. Call ended at 2015  Labs Review Labs Reviewed  PRO B NATRIURETIC PEPTIDE - Abnormal; Notable for the following:    Pro B Natriuretic peptide (BNP) 3388.0 (*)    All other components within normal limits  BASIC METABOLIC PANEL - Abnormal; Notable for the following:    Sodium 133 (*)    Chloride 88 (*)    Glucose, Bld 233 (*)    BUN 99 (*)    Creatinine, Ser 3.90 (*)    GFR calc non Af Amer 10 (*)    GFR calc Af Amer 11 (*)    All other components within normal limits  CBC - Abnormal; Notable for the following:    Hemoglobin 11.5 (*)    HCT 35.3 (*)    RDW 16.1 (*)    Platelets 110 (*)    All other components within normal limits  URINALYSIS, ROUTINE W REFLEX MICROSCOPIC - Abnormal; Notable for the following:    APPearance HAZY (*)    Hgb urine dipstick TRACE (*)    Leukocytes, UA MODERATE (*)    All other components within normal limits  URINE MICROSCOPIC-ADD ON - Abnormal; Notable for the following:    Squamous Epithelial / LPF FEW (*)    Bacteria, UA MANY (*)    All other components within normal limits  URINE CULTURE  POCT I-STAT TROPONIN I   Imaging Review Dg Chest Port 1 View  07/05/2013   *RADIOLOGY REPORT*  Clinical Data: Shortness of breath and leg swelling  PORTABLE CHEST - 1 VIEW  Comparison:  06/18/2013 and multiple prior chest radiographs  Findings: Cardiomegaly, right pleural effusion and increasing right lower lung atelectasis noted. The left lung is clear.  There is no evidence of pneumothorax. No acute bony abnormalities are present.  IMPRESSION: Cardiomegaly with right pleural effusion and increasing right lower lung atelectasis.   Original Report Authenticated By: Harmon Pier, M.D.    MDM   1. Weakness   2.  Acute kidney injury   3. Pleural effusion   4. UTI (lower urinary tract infection)   5. Hyperglycemia    Weakness with urinary tract infection. There is associated significant kidney injury with elevated creatinine from baseline, and elevated BNP. The BNP is chronically elevated. There is no known history of congestive heart failure. Pulmonary/pleural effusion is nonspecific. She does not have respiratory symptoms, at this time. She is not in overt fluid overload, and there is no indication for dialysis, at this time. However, the effusion is larger than previously seen, and may require further evaluation. Patient is to be admitted for further observation, treatment, and stabilization.   Nursing Notes Reviewed/ Care Coordinated, and agree without changes. Applicable Imaging Reviewed.  Interpretation of Laboratory Data incorporated into ED treatment  Plan: Admit    Flint Melter, MD 07/05/13 2018

## 2013-07-06 ENCOUNTER — Inpatient Hospital Stay (HOSPITAL_COMMUNITY): Payer: Medicare Other

## 2013-07-06 DIAGNOSIS — I4891 Unspecified atrial fibrillation: Secondary | ICD-10-CM

## 2013-07-06 DIAGNOSIS — I5032 Chronic diastolic (congestive) heart failure: Secondary | ICD-10-CM

## 2013-07-06 LAB — GLUCOSE, CAPILLARY
Glucose-Capillary: 172 mg/dL — ABNORMAL HIGH (ref 70–99)
Glucose-Capillary: 199 mg/dL — ABNORMAL HIGH (ref 70–99)

## 2013-07-06 LAB — CBC
Hemoglobin: 11.5 g/dL — ABNORMAL LOW (ref 12.0–15.0)
MCH: 28 pg (ref 26.0–34.0)
MCHC: 32.3 g/dL (ref 30.0–36.0)
MCV: 86.6 fL (ref 78.0–100.0)
RBC: 4.11 MIL/uL (ref 3.87–5.11)

## 2013-07-06 LAB — COMPREHENSIVE METABOLIC PANEL
BUN: 93 mg/dL — ABNORMAL HIGH (ref 6–23)
CO2: 35 mEq/L — ABNORMAL HIGH (ref 19–32)
Calcium: 9.6 mg/dL (ref 8.4–10.5)
Creatinine, Ser: 3.34 mg/dL — ABNORMAL HIGH (ref 0.50–1.10)
GFR calc Af Amer: 14 mL/min — ABNORMAL LOW (ref >90)
GFR calc non Af Amer: 12 mL/min — ABNORMAL LOW (ref >90)
Glucose, Bld: 126 mg/dL — ABNORMAL HIGH (ref 70–99)
Total Protein: 6.9 g/dL (ref 6.0–8.3)

## 2013-07-06 LAB — CREATININE, URINE, RANDOM: Creatinine, Urine: 27.52 mg/dL

## 2013-07-06 LAB — PROTIME-INR: INR: 3.28 — ABNORMAL HIGH (ref 0.00–1.49)

## 2013-07-06 LAB — SODIUM, URINE, RANDOM: Sodium, Ur: 82 mEq/L

## 2013-07-06 MED ORDER — ENSURE COMPLETE PO LIQD
237.0000 mL | Freq: Two times a day (BID) | ORAL | Status: DC
  Administered 2013-07-07 (×2): 237 mL via ORAL

## 2013-07-06 MED ORDER — ALPRAZOLAM 0.25 MG PO TABS
0.1250 mg | ORAL_TABLET | Freq: Three times a day (TID) | ORAL | Status: DC | PRN
  Administered 2013-07-06: 0.125 mg via ORAL
  Filled 2013-07-06: qty 1

## 2013-07-06 MED ORDER — CARVEDILOL 12.5 MG PO TABS
12.5000 mg | ORAL_TABLET | Freq: Two times a day (BID) | ORAL | Status: DC
  Administered 2013-07-06 – 2013-07-07 (×2): 12.5 mg via ORAL
  Filled 2013-07-06 (×4): qty 1

## 2013-07-06 MED ORDER — WARFARIN - PHARMACIST DOSING INPATIENT: Freq: Every day | Status: DC

## 2013-07-06 MED ORDER — ALBUTEROL SULFATE (5 MG/ML) 0.5% IN NEBU: 2.5000 mg | INHALATION_SOLUTION | RESPIRATORY_TRACT | Status: DC | PRN

## 2013-07-06 NOTE — Progress Notes (Signed)
ANTICOAGULATION CONSULT NOTE - Initial Consult  Pharmacy Consult for Coumadin Indication: atrial fibrillation  Allergies  Allergen Reactions  . Morphine Sulfate Other (See Comments)    REACTION: change in personality  . Remeron [Mirtazapine] Other (See Comments)    Altered mental status, lethargy  . Prescott Gum [Fish Allergy] Other (See Comments)    unknown  . Penicillins Rash    Tolerates Ancef.    Patient Measurements: Height: 5\' 3"  (160 cm) Weight: 207 lb 3.7 oz (94 kg) IBW/kg (Calculated) : 52.4  Vital Signs: Temp: 97.8 F (36.6 C) (09/01 2138) Temp src: Oral (09/01 2138) BP: 148/76 mmHg (09/01 2138) Pulse Rate: 87 (09/01 2138)  Labs:  Recent Labs  07/05/13 1448  HGB 11.5*  HCT 35.3*  PLT 110*  CREATININE 3.90*    Estimated Creatinine Clearance: 11.7 ml/min (by C-G formula based on Cr of 3.9).   Medical History: Past Medical History  Diagnosis Date  . GERD (gastroesophageal reflux disease)   . Hiatal hernia   . HTN (hypertension)     all her life  . Atrial fibrillation     since 1996  . Depression   . Chronic diastolic heart failure 08/31/2012  . Tibia fracture     BILATERAL  . Respiratory failure     acute on chronic hx of  requiring BIPAP  . COPD (chronic obstructive pulmonary disease)   . Metabolic encephalopathy     hx of   . UTI (urinary tract infection)     hx of   . Pleural effusion     right sided now resolved   . CHF (congestive heart failure)     diastolic HF  . Complication of anesthesia     "hard to wake up" (03/11/2013)  . OSA on CPAP   . Type II diabetes mellitus 1980's?  . History of blood transfusion 08/2012    "related to leg OR" (03/11/2013)  . Arthritis     "left arm; lower back, hips, hands" (03/11/2013)  . Chronic lower back pain     "disc pops in and out" (03/11/2013)  . Gout   . Anxiety   . Fall 08/2012    "in nursing home" (03/11/2013)  . Anemia   . Chronic a-fib 03/12/2013  . CKD (chronic kidney disease), stage III     GFR:  38  . Renal failure     acute on chronic   . Renal failure     acute on chronic with need for intermittent dialysis - hx of     Medications:  Prescriptions prior to admission  Medication Sig Dispense Refill  . acetaminophen (TYLENOL) 325 MG tablet Take 650 mg by mouth every 6 (six) hours as needed for pain.      Marland Kitchen allopurinol (ZYLOPRIM) 100 MG tablet Take 100 mg by mouth daily.      Marland Kitchen ALPRAZolam (XANAX) 0.25 MG tablet Take 0.125 mg by mouth every 8 (eight) hours as needed for anxiety.      Marland Kitchen amiodarone (PACERONE) 200 MG tablet Take 400 mg by mouth daily.      . busPIRone (BUSPAR) 5 MG tablet Take 5 mg by mouth every 12 (twelve) hours.       . carvedilol (COREG) 25 MG tablet Take 25 mg by mouth 2 (two) times daily with a meal.      . docusate sodium (COLACE) 100 MG capsule Take 100 mg by mouth every 8 (eight) hours.       . fluticasone (FLONASE) 50  MCG/ACT nasal spray Place 1 spray into the nose daily.      Marland Kitchen guaiFENesin (MUCINEX) 600 MG 12 hr tablet Take 600 mg by mouth 2 (two) times daily.       Marland Kitchen HYDROcodone-acetaminophen (NORCO) 7.5-325 MG per tablet Take 1 tablet by mouth at bedtime as needed for pain.      Marland Kitchen insulin aspart (NOVOLOG) 100 UNIT/ML injection Inject 5 Units into the skin 3 (three) times daily with meals. If cbg is greater than 150      . insulin glargine (LANTUS) 100 UNIT/ML injection Inject 5 Units into the skin at bedtime.      . metolazone (ZAROXOLYN) 2.5 MG tablet Take 2.5 mg every Wednesday or if weight greater than 205 lbs  15 tablet  6  . nitroGLYCERIN (NITROSTAT) 0.4 MG SL tablet Place 0.4 mg under the tongue every 5 (five) minutes as needed for chest pain.      Marland Kitchen nystatin (MYCOSTATIN) 100000 UNIT/ML suspension Take 5 mL (500,000 Units total) by mouth 4 (four) times daily.  60 mL  0  . omeprazole (PRILOSEC) 20 MG capsule Take 40 mg by mouth daily.       . ondansetron (ZOFRAN) 4 MG tablet Take 1 tablet (4 mg total) by mouth every 6 (six) hours as needed for nausea.   20 tablet    . polyethylene glycol (MIRALAX / GLYCOLAX) packet Take 17 g by mouth 2 (two) times daily.       Marland Kitchen torsemide (DEMADEX) 20 MG tablet Take 4 tablets (80 mg total) by mouth 2 (two) times daily.  60 tablet  0  . traZODone (DESYREL) 50 MG tablet Take 50 mg by mouth at bedtime.      Marland Kitchen warfarin (COUMADIN) 5 MG tablet Take 2.5 mg by mouth daily.      . Melatonin 3 MG CAPS Take 1 capsule by mouth at bedtime.       Scheduled:  . allopurinol  100 mg Oral Daily  . amiodarone  400 mg Oral Daily  . busPIRone  5 mg Oral Q12H  . carvedilol  25 mg Oral BID WC  . docusate sodium  100 mg Oral Q8H  . fluticasone  1 spray Each Nare Daily  . guaiFENesin  600 mg Oral BID  . insulin aspart  0-8 Units Subcutaneous TID WC  . nystatin  5 mL Oral QID  . pantoprazole  80 mg Oral Daily  . polyethylene glycol  17 g Oral BID  . sodium chloride  3 mL Intravenous Q12H  . traZODone  50 mg Oral QHS  . Warfarin - Pharmacist Dosing Inpatient   Does not apply q1800    Assessment/Plan: 79yo female c/o wt gain and SOB related to CHF, also in acute on CRF, to continue Coumadin for Afib; pt did not take Coumadin 9/1 prior to coming to ED but lab never drew INR as ordered, last INR as outpt on 8/28 was 3 (high end of goal).  Will await am labs prior to resuming Coumadin.  Vernard Gambles, PharmD, BCPS  07/06/2013,1:09 AM

## 2013-07-06 NOTE — Evaluation (Signed)
Physical Therapy Evaluation Patient Details Name: Gina Ashley MRN: [id number] DOB: April 19, 1927 Today's Date: 07/06/2013 Time: 1340-1400 PT Time Calculation (min): 20 min  PT Assessment / Plan / Recommendation History of Present Illness  Pt admitted with increasing weakness due to AKI on CKD. Recent admission for weakness and PNA and UTI.  Pt with hx of DM and CHF/  Clinical Impression  Pt needs assist with standing and transfers due to weakness and leg buckling.  Anticipate she will improve in strength and functional mobility as she recovers and be able to return to home with HHPT as prior to admission    PT Assessment  Patient needs continued PT services;All further PT needs can be met in the next venue of care    Follow Up Recommendations  Home health PT;Supervision/Assistance - 24 hour    Does the patient have the potential to tolerate intense rehabilitation      Barriers to Discharge        Equipment Recommendations  None recommended by PT    Recommendations for Other Services     Frequency Min 3X/week    Precautions / Restrictions Precautions Precautions: Fall Required Braces or Orthoses:  (pt has an AFO, but it is at home) Restrictions Weight Bearing Restrictions: No   Pertinent Vitals/Pain No c/o pain      Mobility  Bed Mobility Bed Mobility: Rolling Right;Rolling Left Rolling Right: 4: Min assist;With rail Rolling Left: 4: Min assist;With rail Details for Bed Mobility Assistance: pt bends legs up to assist with rolling Transfers Transfers: Sit to Stand;Stand to Sit;Stand Pivot Transfers Sit to Stand: 1: +2 Total assist Sit to Stand: Patient Percentage: 50% Stand to Sit: 1: +2 Total assist Stand to Sit: Patient Percentage: 50% Stand Pivot Transfers: 1: +2 Total assist Stand Pivot Transfers: Patient Percentage: 50% Details for Transfer Assistance: Pt reports being "weak" and has episosed of buckling when standing up on legs.  She needs assit to help hold  her up and for safety Stand pivot to bedside commode Ambulation/Gait Ambulation/Gait Assistance: Not tested (comment) Assistive device: Rolling walker    Exercises     PT Diagnosis: Difficulty walking;Generalized weakness  PT Problem List: Decreased strength;Decreased activity tolerance;Decreased balance;Decreased mobility;Decreased knowledge of use of DME;Decreased knowledge of precautions;Cardiopulmonary status limiting activity PT Treatment Interventions: DME instruction;Gait training;Functional mobility training;Balance training;Patient/family education     PT Goals(Current goals can be found in the care plan section) Acute Rehab PT Goals Patient Stated Goal: To return home PT Goal Formulation: With patient Time For Goal Achievement: 07/20/13 Potential to Achieve Goals: Good  Visit Information  Last PT Received On: 07/06/13 Assistance Needed: +2 PT/OT Co-Evaluation/Treatment: Yes History of Present Illness: Pt admitted with increasing weakness due to AKI on CKD. Recent admission for weakness and PNA and UTI.  Pt with hx of DM and CHF/       Prior Functioning  Home Living Family/patient expects to be discharged to:: Private residence Living Arrangements: Children (2 adult daughters) Available Help at Discharge: Family;Available 24 hours/day Type of Home: House Home Access: Level entry Home Layout: One level Home Equipment: Walker - 2 wheels;Bedside commode;Wheelchair - manual;Hospital bed Additional Comments: Pt was at home receiving [facility] therapies([facility]), walked short distances with her walker. Prior Function Level of Independence: Independent with assistive device(s);Needs assistance Gait / Transfers Assistance Needed: pt was walking short distances ADL's / Homemaking Assistance Needed: Homemaking and meals Communication / Swallowing Assistance Needed: Takes sponge baths Communication Communication: No difficulties Dominant Hand: Right  Cognition   Cognition Arousal/Alertness: Awake/alert Behavior During Therapy: WFL for tasks assessed/performed Overall Cognitive Status: Within Functional Limits for tasks assessed    Extremity/Trunk Assessment Upper Extremity Assessment Upper Extremity Assessment: Defer to OT evaluation Lower Extremity Assessment Lower Extremity Assessment: Generalized weakness;LLE deficits/detail LLE Deficits / Details: pt with foot drop due to fractures on LLE last year.  Family reports pt has "Astronomer" at home that they will bring in Cervical / Trunk Assessment Cervical / Trunk Assessment: Normal   Balance Balance Balance Assessed: Yes Static Sitting Balance Static Sitting - Balance Support: No upper extremity supported;Feet supported Static Sitting - Level of Assistance: 5: Stand by assistance Static Standing Balance Static Standing - Balance Support: Bilateral upper extremity supported Static Standing - Level of Assistance: 1: +2 Total assist Static Standing - Comment/# of Minutes: pt with leg buckling and "bouncing" in standing  End of Session PT - End of Session Activity Tolerance: Patient limited by fatigue Patient left: in bed;with family/visitor present Nurse Communication: Mobility status  GP     Donnetta Hail 07/06/2013, 2:18 PM

## 2013-07-06 NOTE — Evaluation (Signed)
Occupational Therapy Evaluation Patient Details Name: Gina Ashley MRN: [id number] DOB: 09-11-1927 Today's Date: 07/06/2013 Time: 7829-5621 OT Time Calculation (min): 23 min  OT Assessment / Plan / Recommendation History of present illness Pt admitted with increasing weakness due to AKI on CKD. Recent admission for weakness and PNA and UTI.  Pt with hx of DM and CHF/   Clinical Impression   Pt presents with generalized weakness limiting mobility for OOB activity and ADL.  Requires +2 assist due to legs buckling.  Pt has excellent family support and should be able to return home with return of [facility] therapies.    OT Assessment  Patient needs continued OT Services    Follow Up Recommendations  Home health OT;Supervision/Assistance - 24 hour    Barriers to Discharge      Equipment Recommendations  None recommended by OT    Recommendations for Other Services    Frequency  Min 2X/week    Precautions / Restrictions Precautions Precautions: Fall Required Braces or Orthoses: Other Brace/Splint (has foot drop orthosis at home) Restrictions Weight Bearing Restrictions: No   Pertinent Vitals/Pain VSS, no pain    ADL  Eating/Feeding: Set up Where Assessed - Eating/Feeding: Bed level Grooming: Wash/dry hands;Wash/dry face;Set up Where Assessed - Grooming: Unsupported sitting Upper Body Bathing: Minimal assistance Where Assessed - Upper Body Bathing: Unsupported sitting Lower Body Bathing: +1 Total assistance Where Assessed - Lower Body Bathing: Unsupported sitting;Supported sit to stand Upper Body Dressing: Minimal assistance Where Assessed - Upper Body Dressing: Unsupported sitting Lower Body Dressing: +1 Total assistance Where Assessed - Lower Body Dressing: Unsupported sitting;Supported sit to stand Toilet Transfer: +2 Total assistance Toilet Transfer: Patient Percentage: 50% Toilet Transfer Method: Stand pivot Acupuncturist: Bedside commode Toileting - Clothing  Manipulation and Hygiene: +1 Total assistance Where Assessed - Engineer, mining and Hygiene: Standing Equipment Used: Rolling walker Transfers/Ambulation Related to ADLs: +2 total assist pt 50 %, knees with buckling ADL Comments: pt's daughters assist with washing her back, pericare, and LB bathing and dressing.    OT Diagnosis: Generalized weakness  OT Problem List: Decreased strength;Decreased activity tolerance;Impaired balance (sitting and/or standing);Obesity OT Treatment Interventions: Self-care/ADL training;DME and/or AE instruction;Patient/family education;Balance training   OT Goals(Current goals can be found in the care plan section) Acute Rehab OT Goals Patient Stated Goal: To return home OT Goal Formulation: With patient Time For Goal Achievement: 07/20/13 Potential to Achieve Goals: Good ADL Goals Pt Will Perform Grooming: with min guard assist;standing Pt Will Transfer to Toilet: with min guard assist;ambulating;bedside commode Pt Will Perform Toileting - Clothing Manipulation and hygiene: with min assist;with caregiver independent in assisting;sit to/from stand  Visit Information  Last OT Received On: 07/06/13 Assistance Needed: +2 PT/OT Co-Evaluation/Treatment: Yes History of Present Illness: Pt admitted with increasing weakness due to AKI on CKD. Recent admission for weakness and PNA and UTI.  Pt with hx of DM and CHF/       Prior Functioning     Home Living Family/patient expects to be discharged to:: Private residence Living Arrangements: Children Available Help at Discharge: Family;Available 24 hours/day Type of Home: House Home Access: Level entry Home Layout: One level Home Equipment: Walker - 2 wheels;Bedside commode;Wheelchair - manual;Hospital bed Additional Comments: Pt was at home receiving [facility] therapies([facility]), walked short distances with her walker. Prior Function Level of Independence: Independent with assistive  device(s);Needs assistance Gait / Transfers Assistance Needed: pt was walking short distances ADL's / Homemaking Assistance Needed: mod assist for bathing  and dressing Communication / Swallowing Assistance Needed: Takes sponge baths Comments: daughters are in the medical field Communication Communication: No difficulties Dominant Hand: Right         Vision/Perception Vision - History Baseline Vision: Wears glasses only for reading Patient Visual Report: No change from baseline   Cognition  Cognition Arousal/Alertness: Awake/alert Behavior During Therapy: WFL for tasks assessed/performed Overall Cognitive Status: Within Functional Limits for tasks assessed    Extremity/Trunk Assessment Upper Extremity Assessment Upper Extremity Assessment: Overall WFL for tasks assessed Lower Extremity Assessment Lower Extremity Assessment: Defer to PT evaluation LLE Deficits / Details: pt with foot drop due to fractures on LLE last year.  Family reports pt has "Astronomer" at home that they will bring in Cervical / Trunk Assessment Cervical / Trunk Assessment: Normal     Mobility Bed Mobility Bed Mobility: Rolling Right;Rolling Left Rolling Right: 4: Min assist;With rail Rolling Left: 4: Min assist;With rail Details for Bed Mobility Assistance: pt bends legs up to assist with rolling Transfers Sit to Stand: 1: +2 Total assist Sit to Stand: Patient Percentage: 50% Stand to Sit: 1: +2 Total assist Stand to Sit: Patient Percentage: 50% Details for Transfer Assistance: Pt reports being "weak" and has episosed of buckling when standing up on legs.  She needs assit to help hold her up and for safety Stand pivot to bedside commode     Exercise     Balance Balance Balance Assessed: Yes Static Sitting Balance Static Sitting - Balance Support: No upper extremity supported;Feet supported Static Sitting - Level of Assistance: 5: Stand by assistance Static Standing Balance Static  Standing - Balance Support: Bilateral upper extremity supported Static Standing - Level of Assistance: 1: +2 Total assist Static Standing - Comment/# of Minutes: pt with leg buckling and "bouncing" in standing   End of Session OT - End of Session Activity Tolerance: Patient limited by fatigue Patient left: in bed;with call bell/phone within reach;with family/visitor present Nurse Communication: Mobility status (pt had bm)  GO     Evern Bio 07/06/2013, 2:52 PM [phone]

## 2013-07-06 NOTE — Progress Notes (Signed)
Bladder scan showed residual of 300 ml. Inserted 16 french Foley in pt at 1445. Urine output was 150 ml.   Peter Congo RN

## 2013-07-06 NOTE — Progress Notes (Signed)
INITIAL NUTRITION ASSESSMENT  DOCUMENTATION CODES Per approved criteria  -Obesity Unspecified   INTERVENTION: Ensure Complete po BID, each supplement provides 350 kcal and 13 grams of protein.  NUTRITION DIAGNOSIS: Inadequate oral intake related to decreased appetite as evidenced by Meal Completion: <25%.   Goal: Pt to meet >/= 90% of their estimated nutrition needs   Monitor:  PO intake, supplement acceptance, weight, labs  Reason for Assessment: Pt identified as at nutrition risk on the Malnutrition Screen Tool  79 y.o. female  Admitting Dx: Renal failure (ARF), acute on chronic  ASSESSMENT: Per pt and daughters pt has lost weight. However, weight hx reviewed with pt and family and they report weight is likely the same. 24 hr recall reviewed which appeared adequate.  Pt reports that appetite is decreased here. Pt loves ensure but will drink glucerna shake if needed.   Nutrition Focused Physical Exam:  Subcutaneous Fat:  Orbital Region: WNL Upper Arm Region: WNL Thoracic and Lumbar Region: WNL  Muscle:  Temple Region: WNL Clavicle Bone Region: WNL Clavicle and Acromion Bone Region: WNL Scapular Bone Region: mild wasting Dorsal Hand: WNL Patellar Region: WNL Anterior Thigh Region: WNL Posterior Calf Region: WNL  Edema: 1+ present   Height: Ht Readings from Last 1 Encounters:  07/05/13 5\' 3"  (1.6 m)    Weight: Wt Readings from Last 1 Encounters:  07/06/13 208 lb 5.4 oz (94.5 kg)    Ideal Body Weight: 52.2 kg   % Ideal Body Weight: 181%  Wt Readings from Last 10 Encounters:  07/06/13 208 lb 5.4 oz (94.5 kg)  06/19/13 214 lb 8.1 oz (97.3 kg)  06/13/13 199 lb (90.266 kg)  06/09/13 205 lb (92.987 kg)  05/05/13 198 lb (89.812 kg)  04/30/13 196 lb (88.905 kg)  04/22/13 194 lb (87.998 kg)  04/09/13 223 lb (101.152 kg)  04/05/13 219 lb 6.4 oz (99.519 kg)  04/01/13 220 lb (99.791 kg)    Usual Body Weight: 201-204 lb  % Usual Body Weight:  >100%  BMI:  Body mass index is 36.91 kg/(m^2).  Estimated Nutritional Needs: Kcal: 1650-1800 Protein: 75-85 grams Fluid: > 1.6 L/day  Skin: no issues noted  Diet Order: Cardiac Meal Completion: <25%  EDUCATION NEEDS: -No education needs identified at this time   Intake/Output Summary (Last 24 hours) at 07/06/13 1009 Last data filed at 07/06/13 0948  Gross per 24 hour  Intake      0 ml  Output   1575 ml  Net  -1575 ml    Last BM: PTA   Labs:   Recent Labs Lab 07/05/13 1448 07/06/13 0740  NA 133* 137  K 4.1 3.3*  CL 88* 91*  CO2 31 35*  BUN 99* 93*  CREATININE 3.90* 3.34*  CALCIUM 9.5 9.6  GLUCOSE 233* 126*    CBG (last 3)   Recent Labs  07/05/13 2326 07/06/13 0743  GLUCAP 150* 129*    Scheduled Meds: . allopurinol  100 mg Oral Daily  . amiodarone  400 mg Oral Daily  . busPIRone  5 mg Oral Q12H  . carvedilol  25 mg Oral BID WC  . docusate sodium  100 mg Oral Q8H  . fluticasone  1 spray Each Nare Daily  . guaiFENesin  600 mg Oral BID  . insulin aspart  0-8 Units Subcutaneous TID WC  . nystatin  5 mL Oral QID  . pantoprazole  80 mg Oral Daily  . polyethylene glycol  17 g Oral BID  . sodium chloride  3 mL Intravenous Q12H  . traZODone  50 mg Oral QHS  . Warfarin - Pharmacist Dosing Inpatient   Does not apply q1800    Continuous Infusions:   Past Medical History  Diagnosis Date  . GERD (gastroesophageal reflux disease)   . Hiatal hernia   . HTN (hypertension)     all her life  . Atrial fibrillation     since 1996  . Depression   . Chronic diastolic heart failure 08/31/2012  . Tibia fracture     BILATERAL  . Respiratory failure     acute on chronic hx of  requiring BIPAP  . COPD (chronic obstructive pulmonary disease)   . Metabolic encephalopathy     hx of   . UTI (urinary tract infection)     hx of   . Pleural effusion     right sided now resolved   . CHF (congestive heart failure)     diastolic HF  . Complication of  anesthesia     "hard to wake up" (03/11/2013)  . OSA on CPAP   . Type II diabetes mellitus 1980's?  . History of blood transfusion 08/2012    "related to leg OR" (03/11/2013)  . Arthritis     "left arm; lower back, hips, hands" (03/11/2013)  . Chronic lower back pain     "disc pops in and out" (03/11/2013)  . Gout   . Anxiety   . Fall 08/2012    "in nursing home" (03/11/2013)  . Anemia   . Chronic a-fib 03/12/2013  . CKD (chronic kidney disease), stage III     GFR: 38  . Renal failure     acute on chronic   . Renal failure     acute on chronic with need for intermittent dialysis - hx of     Past Surgical History  Procedure Laterality Date  . Bunionectomy Bilateral     bilateral  . Cholecystectomy    . Shoulder arthroscopy w/ rotator cuff repair Left 2002  . Total knee arthroplasty Bilateral 2001  . Hammer toe surgery Bilateral 2004  . Colonoscopy  07/11/2011    Procedure: COLONOSCOPY;  Surgeon: Malissa Hippo, MD;  Location: [facility];  Service: Endoscopy;  Laterality: N/A;  3:00  . Orif periprosthetic fracture  09/08/2012    Procedure: OPEN REDUCTION INTERNAL FIXATION (ORIF) PERIPROSTHETIC FRACTURE;  Surgeon: Shelda Pal, MD;  Location: [facility] ORS;  Service: Orthopedics;  Laterality: Right;  ORIF periprosthetic right proximal femur fracture   . External fixation leg  09/08/2012    Procedure: EXTERNAL FIXATION LEG;  Surgeon: Shelda Pal, MD;  Location: [facility] ORS;  Service: Orthopedics;  Laterality: Left;  . Joint replacement    . External fixation leg  11/30/2012    Procedure: EXTERNAL FIXATION LEG;  Surgeon: Shelda Pal, MD;  Location: [facility] ORS;  Service: Orthopedics;  Laterality: Left;  Removal of External Fixation Left Knee with Evaluation with Floroscopy  . Tee without cardioversion N/A 01/19/2013    Procedure: TRANSESOPHAGEAL ECHOCARDIOGRAM (TEE);  Surgeon: Lewayne Bunting, MD;  Location: [facility] ENDOSCOPY;  Service: Cardiovascular;  Laterality: N/A;  . Shoulder open rotator cuff  repair Left 2002    "maybe 3 months after scope" (03/11/2013)  . Femur fracture surgery Right 08/2012    "fell at nursing home" (03/11/2013)  . Tibia fracture surgery Left 08/2012    "fell at nursing home" (03/11/2013)    Kendell Bane RD, LDN, CNSC [phone] Pager [phone] After Hours Pager

## 2013-07-06 NOTE — Consult Note (Signed)
Reason for Consult:AKI on CKD Referring Physician: Dr. Fayne Norrie Gina Ashley is an 79 y.o. female.  HPI: Gina Ashley is an 79 yo AA woman with pmh of Afib on anticoagulation with coumadin, CHF with last EF 55-60% on 3/14, IDDM, COPD, CKD stage IV (GFR 38) and hx of MDR K. pneumoniae UTI. Pt was recently admitted on 8/14-8/16 for generalized weakness with etiology thought to be untreated chronic UTI. ID was consulted and pt was given fosfomycin and levofloxacin for suspected PNA. Pt then had some worsening generalized weakness, muscle aches, itchiness, and periods of delayed thought process and so daughters brought into ED. Pt was not fluid overloaded on PE in ED, Wt was decreased from baseline 197lbs (208lb with goal of 208-215lbs) but noticed to have AKI 3.9 (2.8) and BUN 99. Pt had AG 14 and ABG showed chronic compensated primary respiratory alkalosis in setting of COPD.   Pt has hx of cardiorenal syndrome back in 12/13 where she had a RIJ HD cath and completed 4 sessions of HD w/o complication but was not considered a good candidate for future permanent HD access. This was discussed and agreed upon by family and primary cardiologist Dr. Julious Payer. Pt does not have any permanent or temporary HD access at this time.   Pt and daughters whom are primary caregivers state that the patient is interacting better today and pt herself states is less fatigued. She denied any productive cough, SOB, DOE from baseline, CP, increased LE edema, rashes, n/v/d. Daughters did notice that patient has had decreased PO intake over the last couple of days as well. Pt did report itchiness that has decreased since admission, ongoing tooth pain that has increased recently w/in the last week, "fogginess", pyuria, urinary frequency, and some suprapubic pain. Pt has had no sick contacts and has been compliant with medications.   Per chart review patient seems to have DNR gold form and MOST forms that were completed during 03/2013  admission and that goals of care would be to not pursue HD.   Past Medical History  Diagnosis Date  . GERD (gastroesophageal reflux disease)   . Hiatal hernia   . HTN (hypertension)     all her life  . Atrial fibrillation     since 1996  . Depression   . Chronic diastolic heart failure 08/31/2012  . Tibia fracture     BILATERAL  . Respiratory failure     acute on chronic hx of  requiring BIPAP  . COPD (chronic obstructive pulmonary disease)   . Metabolic encephalopathy     hx of   . UTI (urinary tract infection)     hx of   . Pleural effusion     right sided now resolved   . CHF (congestive heart failure)     diastolic HF  . Complication of anesthesia     "hard to wake up" (03/11/2013)  . OSA on CPAP   . Type II diabetes mellitus 1980's?  . History of blood transfusion 08/2012    "related to leg OR" (03/11/2013)  . Arthritis     "left arm; lower back, hips, hands" (03/11/2013)  . Chronic lower back pain     "disc pops in and out" (03/11/2013)  . Gout   . Anxiety   . Fall 08/2012    "in nursing home" (03/11/2013)  . Anemia   . Chronic a-fib 03/12/2013  . CKD (chronic kidney disease), stage III     GFR: 38  . Renal failure  acute on chronic   . Renal failure     acute on chronic with need for intermittent dialysis - hx of     Past Surgical History  Procedure Laterality Date  . Bunionectomy Bilateral     bilateral  . Cholecystectomy    . Shoulder arthroscopy w/ rotator cuff repair Left 2002  . Total knee arthroplasty Bilateral 2001  . Hammer toe surgery Bilateral 2004  . Colonoscopy  07/11/2011    Procedure: COLONOSCOPY;  Surgeon: Malissa Hippo, MD;  Location: [facility];  Service: Endoscopy;  Laterality: N/A;  3:00  . Orif periprosthetic fracture  09/08/2012    Procedure: OPEN REDUCTION INTERNAL FIXATION (ORIF) PERIPROSTHETIC FRACTURE;  Surgeon: Shelda Pal, MD;  Location: [facility];  Service: Orthopedics;  Laterality: Right;  ORIF periprosthetic right proximal  femur fracture   . External fixation leg  09/08/2012    Procedure: EXTERNAL FIXATION LEG;  Surgeon: Shelda Pal, MD;  Location: [facility];  Service: Orthopedics;  Laterality: Left;  . Joint replacement    . External fixation leg  11/30/2012    Procedure: EXTERNAL FIXATION LEG;  Surgeon: Shelda Pal, MD;  Location: [facility];  Service: Orthopedics;  Laterality: Left;  Removal of External Fixation Left Knee with Evaluation with Floroscopy  . Tee without cardioversion N/A 01/19/2013    Procedure: TRANSESOPHAGEAL ECHOCARDIOGRAM (TEE);  Surgeon: Lewayne Bunting, MD;  Location: [facility] ENDOSCOPY;  Service: Cardiovascular;  Laterality: N/A;  . Shoulder open rotator cuff repair Left 2002    "maybe 3 months after scope" (03/11/2013)  . Femur fracture surgery Right 08/2012    "fell at nursing home" (03/11/2013)  . Tibia fracture surgery Left 08/2012    "fell at nursing home" (03/11/2013)    Family History  Problem Relation Age of Onset  . Colon cancer Brother     Social History:  reports that she has never smoked. She has never used smokeless tobacco. She reports that she does not drink alcohol or use illicit drugs.  Allergies:  Allergies  Allergen Reactions  . Morphine Sulfate Other (See Comments)    REACTION: change in personality  . Remeron [Mirtazapine] Other (See Comments)    Altered mental status, lethargy  . Prescott Gum [Fish Allergy] Other (See Comments)    unknown  . Penicillins Rash    Tolerates Ancef.    Medications: I have reviewed the patient's current medications. Prescriptions prior to admission  Medication Sig Dispense Refill  . acetaminophen (TYLENOL) 325 MG tablet Take 650 mg by mouth every 6 (six) hours as needed for pain.      Marland Kitchen allopurinol (ZYLOPRIM) 100 MG tablet Take 100 mg by mouth daily.      Marland Kitchen ALPRAZolam (XANAX) 0.25 MG tablet Take 0.125 mg by mouth every 8 (eight) hours as needed for anxiety.      Marland Kitchen amiodarone (PACERONE) 200 MG tablet Take 400 mg by mouth daily.      .  busPIRone (BUSPAR) 5 MG tablet Take 5 mg by mouth every 12 (twelve) hours.       . carvedilol (COREG) 25 MG tablet Take 25 mg by mouth 2 (two) times daily with a meal.      . docusate sodium (COLACE) 100 MG capsule Take 100 mg by mouth every 8 (eight) hours.       . fluticasone (FLONASE) 50 MCG/ACT nasal spray Place 1 spray into the nose daily.      Marland Kitchen guaiFENesin (MUCINEX) 600 MG 12 hr tablet Take  600 mg by mouth 2 (two) times daily.       Marland Kitchen HYDROcodone-acetaminophen (NORCO) 7.5-325 MG per tablet Take 1 tablet by mouth at bedtime as needed for pain.      Marland Kitchen insulin aspart (NOVOLOG) 100 UNIT/ML injection Inject 5 Units into the skin 3 (three) times daily with meals. If cbg is greater than 150      . insulin glargine (LANTUS) 100 UNIT/ML injection Inject 5 Units into the skin at bedtime.      . metolazone (ZAROXOLYN) 2.5 MG tablet Take 2.5 mg every Wednesday or if weight greater than 205 lbs  15 tablet  6  . nitroGLYCERIN (NITROSTAT) 0.4 MG SL tablet Place 0.4 mg under the tongue every 5 (five) minutes as needed for chest pain.      Marland Kitchen nystatin (MYCOSTATIN) 100000 UNIT/ML suspension Take 5 mL (500,000 Units total) by mouth 4 (four) times daily.  60 mL  0  . omeprazole (PRILOSEC) 20 MG capsule Take 40 mg by mouth daily.       . ondansetron (ZOFRAN) 4 MG tablet Take 1 tablet (4 mg total) by mouth every 6 (six) hours as needed for nausea.  20 tablet    . polyethylene glycol (MIRALAX / GLYCOLAX) packet Take 17 g by mouth 2 (two) times daily.       Marland Kitchen torsemide (DEMADEX) 20 MG tablet Take 4 tablets (80 mg total) by mouth 2 (two) times daily.  60 tablet  0  . traZODone (DESYREL) 50 MG tablet Take 50 mg by mouth at bedtime.      Marland Kitchen warfarin (COUMADIN) 5 MG tablet Take 2.5 mg by mouth daily.      . Melatonin 3 MG CAPS Take 1 capsule by mouth at bedtime.         Results for orders placed during the hospital encounter of 07/05/13 (from the past 48 hour(s))  PRO B NATRIURETIC PEPTIDE     Status: Abnormal    Collection Time    07/05/13  2:48 PM      Result Value Range   Pro B Natriuretic peptide (BNP) 3388.0 (*) 0 - 450 pg/mL  BASIC METABOLIC PANEL     Status: Abnormal   Collection Time    07/05/13  2:48 PM      Result Value Range   Sodium 133 (*) 135 - 145 mEq/Ashley   Potassium 4.1  3.5 - 5.1 mEq/Ashley   Chloride 88 (*) 96 - 112 mEq/Ashley   CO2 31  19 - 32 mEq/Ashley   Glucose, Bld 233 (*) 70 - 99 mg/dL   BUN 99 (*) 6 - 23 mg/dL   Creatinine, Ser 9.60 (*) 0.50 - 1.10 mg/dL   Calcium 9.5  8.4 - 45.4 mg/dL   GFR calc non Af Amer 10 (*) >90 mL/min   GFR calc Af Amer 11 (*) >90 mL/min   Comment: (NOTE)     The eGFR has been calculated using the CKD EPI equation.     This calculation has not been validated in all clinical situations.     eGFR's persistently <90 mL/min signify possible Chronic Kidney     Disease.  CBC     Status: Abnormal   Collection Time    07/05/13  2:48 PM      Result Value Range   WBC 5.2  4.0 - 10.5 K/uL   RBC 4.07  3.87 - 5.11 MIL/uL   Hemoglobin 11.5 (*) 12.0 - 15.0 g/dL   HCT 09.8 (*)  36.0 - 46.0 %   MCV 86.7  78.0 - 100.0 fL   MCH 28.3  26.0 - 34.0 pg   MCHC 32.6  30.0 - 36.0 g/dL   RDW 16.1 (*) 09.6 - 04.5 %   Platelets 110 (*) 150 - 400 K/uL   Comment: PLATELET COUNT CONFIRMED BY SMEAR     REPEATED TO VERIFY     SPECIMEN CHECKED FOR CLOTS  POCT I-STAT TROPONIN I     Status: None   Collection Time    07/05/13  3:09 PM      Result Value Range   Troponin i, poc 0.00  0.00 - 0.08 ng/mL   Comment 3            Comment: Due to the release kinetics of cTnI,     a negative result within the first hours     of the onset of symptoms does not rule out     myocardial infarction with certainty.     If myocardial infarction is still suspected,     repeat the test at appropriate intervals.  URINALYSIS, ROUTINE W REFLEX MICROSCOPIC     Status: Abnormal   Collection Time    07/05/13  3:42 PM      Result Value Range   Color, Urine YELLOW  YELLOW   APPearance HAZY (*) CLEAR    Specific Gravity, Urine 1.011  1.005 - 1.030   pH 5.5  5.0 - 8.0   Glucose, UA NEGATIVE  NEGATIVE mg/dL   Hgb urine dipstick TRACE (*) NEGATIVE   Bilirubin Urine NEGATIVE  NEGATIVE   Ketones, ur NEGATIVE  NEGATIVE mg/dL   Protein, ur NEGATIVE  NEGATIVE mg/dL   Urobilinogen, UA 0.2  0.0 - 1.0 mg/dL   Nitrite NEGATIVE  NEGATIVE   Leukocytes, UA MODERATE (*) NEGATIVE  URINE MICROSCOPIC-ADD ON     Status: Abnormal   Collection Time    07/05/13  3:42 PM      Result Value Range   Squamous Epithelial / LPF FEW (*) RARE   WBC, UA 11-20  <3 WBC/hpf   RBC / HPF 0-2  <3 RBC/hpf   Bacteria, UA MANY (*) RARE  BLOOD GAS, ARTERIAL     Status: Abnormal   Collection Time    07/05/13  9:55 PM      Result Value Range   FIO2 0.28     Delivery systems NASAL CANNULA     pH, Arterial 7.397  7.350 - 7.450   pCO2 arterial 59.7 (*) 35.0 - 45.0 mmHg   Comment: CRITICAL RESULT CALLED TO, READ BACK BY AND VERIFIED WITH:     BOBBY SCOTT, RN AT 2210, BY LEE COOK,RRT, RCP ON 09     CRITICAL RESULT CALLED TO, READ BACK BY AND VERIFIED WITH:      BOBBY SCOTT, RN AT 2210, BY LEE COOK,RRT, RCP ON 07/06/13   pO2, Arterial 72.8 (*) 80.0 - 100.0 mmHg   Bicarbonate 36.0 (*) 20.0 - 24.0 mEq/Ashley   TCO2 37.8  0 - 100 mmol/Ashley   Acid-Base Excess 10.8 (*) 0.0 - 2.0 mmol/Ashley   O2 Saturation 94.6     Patient temperature 98.6     Collection site LEFT RADIAL     Drawn by [phone]     Sample type ARTERIAL DRAW     Allens test (pass/fail) PASS  PASS  GLUCOSE, CAPILLARY     Status: Abnormal   Collection Time    07/05/13 11:26 PM  Result Value Range   Glucose-Capillary 150 (*) 70 - 99 mg/dL   Comment 1 Documented in Chart     Comment 2 Notify RN    CBC     Status: Abnormal   Collection Time    07/06/13  7:40 AM      Result Value Range   WBC 5.2  4.0 - 10.5 K/uL   RBC 4.11  3.87 - 5.11 MIL/uL   Hemoglobin 11.5 (*) 12.0 - 15.0 g/dL   HCT 16.1 (*) 09.6 - 04.5 %   MCV 86.6  78.0 - 100.0 fL   MCH 28.0  26.0 - 34.0 pg    MCHC 32.3  30.0 - 36.0 g/dL   RDW 40.9 (*) 81.1 - 91.4 %   Platelets 85 (*) 150 - 400 K/uL   Comment: CONSISTENT WITH PREVIOUS RESULT  COMPREHENSIVE METABOLIC PANEL     Status: Abnormal   Collection Time    07/06/13  7:40 AM      Result Value Range   Sodium 137  135 - 145 mEq/Ashley   Potassium 3.3 (*) 3.5 - 5.1 mEq/Ashley   Chloride 91 (*) 96 - 112 mEq/Ashley   CO2 35 (*) 19 - 32 mEq/Ashley   Glucose, Bld 126 (*) 70 - 99 mg/dL   BUN 93 (*) 6 - 23 mg/dL   Creatinine, Ser 7.82 (*) 0.50 - 1.10 mg/dL   Calcium 9.6  8.4 - 95.6 mg/dL   Total Protein 6.9  6.0 - 8.3 g/dL   Albumin 3.5  3.5 - 5.2 g/dL   AST 20  0 - 37 U/Ashley   ALT 11  0 - 35 U/Ashley   Alkaline Phosphatase 163 (*) 39 - 117 U/Ashley   Total Bilirubin 0.7  0.3 - 1.2 mg/dL   GFR calc non Af Amer 12 (*) >90 mL/min   GFR calc Af Amer 14 (*) >90 mL/min   Comment: (NOTE)     The eGFR has been calculated using the CKD EPI equation.     This calculation has not been validated in all clinical situations.     eGFR's persistently <90 mL/min signify possible Chronic Kidney     Disease.  PROTIME-INR     Status: Abnormal   Collection Time    07/06/13  7:40 AM      Result Value Range   Prothrombin Time 32.2 (*) 11.6 - 15.2 seconds   INR 3.28 (*) 0.00 - 1.49  GLUCOSE, CAPILLARY     Status: Abnormal   Collection Time    07/06/13  7:43 AM      Result Value Range   Glucose-Capillary 129 (*) 70 - 99 mg/dL   Comment 1 Documented in Chart     Comment 2 Notify RN      Dg Chest Port 1 View  07/05/2013   *RADIOLOGY REPORT*  Clinical Data: Shortness of breath and leg swelling  PORTABLE CHEST - 1 VIEW  Comparison: 06/18/2013 and multiple prior chest radiographs  Findings: Cardiomegaly, right pleural effusion and increasing right lower lung atelectasis noted. The left lung is clear.  There is no evidence of pneumothorax. No acute bony abnormalities are present.  IMPRESSION: Cardiomegaly with right pleural effusion and increasing right lower lung atelectasis.   Original  Report Authenticated By: Harmon Pier, M.D.    ROS: otherwise negative unless listed in HPI  Blood pressure 125/75, pulse 80, temperature 97.9 F (36.6 C), temperature source Oral, resp. rate 18, height 5\' 3"  (1.6 m), weight 208 lb  5.4 oz (94.5 kg), SpO2 98.00%. General: resting in bed, NAD,  HEENT: PERRL, EOMI, no scleral icterus, cracked lips, no oral lesions, poor dentition with visible frontal incisor caries, dentures for upper teeth  Cardiac: distant HS, RRR, no rubs, murmurs or gallops, no S3/S4 Pulm: decreased BS on RLL and RML otherwise CTA with no wheezes or crackles, moving normal volumes of air Abd: soft, nontender, nondistended, BS present, no suprapubic tenderness but suprapubic mass, no CVA tenderness Ext: warm and well perfused, 1+ pedal edema Neuro: alert and oriented X3, cranial nerves II-XII grossly intact  Assessment/Plan: 1. AKI on CKD: Multifactorial etiology. Pt doesn't appear to be volume overloaded from "baseline" weights stable/pro-BNP near baseline although pt does have chronic R pleural effusion along with pt SBP slightly low as well supporting nonfluid overloaded state of patient. Pt baseline Cr 2.8 with little to no change in GFR since 2013. Pt had good UO today with 0.70ml/kg/hr therefore not meeting definition of oliguria. Pt has CKD IV and not a HD candidate and pts GOC as per Palliative consult in 5/14 is to NOT pursue HD. Pt does have palpable bladder on exam in setting of recent prescription narcotic use. Pt also did receive fosfomycin which could have induced slight injury given high PO4 concentration of medication. Pt doesn't appear to have indication for HD at this time including no metabolic acidosis, hyperkalemia, significant uremia, or significant oliguria. UCx pending at this time.  -insert foley, FeNa , renal US, repeat Bmet in AM. Differential Dx:  Toxic/hemodynamic with ACEI and BP changes, vs obstruction/infx.  Needs foley  2. HTN: pt appears to be  running lower BP since admission with SBP 110s which maybe compromising profusion and contributing to AKI.  -holding all diuretics -no IVF at this time  -decreasing Coreg to 12.5mg  BID.    3. Thrombocytopenia: Pt has had a significant drop in platelets from 110 to 89 today. Pt doesn't have any other signs of SIRS at this time to directly indicate infectious etiology, doesn't appear to be on an offending medication, and pt without liver disease. No active sights of bleeding at this time and INR 3.2.  4 CM 5 Afib 6 Confusion 7 OSA -trend CBCs  Gina Ashley 07/06/2013, 10:34 AM  Internal Medicine Resident, PGY-2 Pgr: [phone] I have seen and examined this patient and agree with the plan of care seen, eval, examined, discussed with resident, family and primary svc. Marland Kitchen  Gina Ashley 07/06/2013, 2:22 PM

## 2013-07-06 NOTE — Progress Notes (Addendum)
PATIENT DETAILS Name: Gina Ashley Age: 79 y.o. Sex: female Date of Birth: 07-31-1927 Admit Date: 07/05/2013 Admitting Physician Hillary Bow, DO WUJ:[id number], Lenon Curt, MD  Subjective: No major complaints this morning-admitted for worsening weakness.  Assessment/Plan: Principal Problem:   Renal failure (ARF), acute on chronic stage IV - Has underlying cardiorenal syndrome- suspect acute renal failure to be from his hemodynamics with progression of cardiorenal syndrome/diuretic therapy - Was seen by nephrology in the past-Dr. Hyman Hopes has noted that the patient is not a candidate for hemodialysis. Patient is aware of this.Await Nephrology eval - Currently, volume status looks to be okay, we'll continue to hold off on resuming diuretic therapy at this time. - Closely monitor weight, intake output and await cardiology evaluation  Active Problems: Chronic diastolic heart failure - Have consulted cardiology. Diuretics currently on hold. Do not see any evidence of decompensation currently.  Atrial fibrillation - Rate controlled with amiodarone and Coreg. INR slightly supratherapeutic, pharmacy managing Coumadin  Chronic right-sided pleural effusion - Prior thoracocentesis- -Was transudative per Light's criteria in 5/14.Likely from CHF-clinically currently asymptomatic from this.   Diabetes - CBGs stable, continue as is high  History of gout - Stable, continue allopurinol    Thrombocytopenia - Chronic issue, monitor platelets periodically  COPD - Stable at present  Gastroesophageal reflux disease - PPI  Recent Klebsiella UTI -Carbapenamase producer and hence, multidrug resistant, was treated with one dose of fosfomycin- without any fever, or leukocytosis to suggest any active infection.  Disposition: Remain inpatient  DVT Prophylaxis: Not needed as on Coumadin.  Code Status: Listed as a full code-we'll await arrival of family to further clarify.  Family  Communication None at bedside during rounds  Procedures:  None  CONSULTS:  cardiology   MEDICATIONS: Scheduled Meds: . allopurinol  100 mg Oral Daily  . amiodarone  400 mg Oral Daily  . busPIRone  5 mg Oral Q12H  . carvedilol  25 mg Oral BID WC  . docusate sodium  100 mg Oral Q8H  . fluticasone  1 spray Each Nare Daily  . guaiFENesin  600 mg Oral BID  . insulin aspart  0-8 Units Subcutaneous TID WC  . pantoprazole  80 mg Oral Daily  . polyethylene glycol  17 g Oral BID  . sodium chloride  3 mL Intravenous Q12H  . traZODone  50 mg Oral QHS  . Warfarin - Pharmacist Dosing Inpatient   Does not apply q1800   Continuous Infusions:  PRN Meds:.acetaminophen, dextrose, ondansetron  Antibiotics: Anti-infectives   Start     Dose/Rate Route Frequency Ordered Stop   07/05/13 2000  cefTRIAXone (ROCEPHIN) 1 g in dextrose 5 % 50 mL IVPB     1 g 100 mL/hr over 30 Minutes Intravenous  Once 07/05/13 1956 07/05/13 2037       PHYSICAL EXAM: Vital signs in last 24 hours: Filed Vitals:   07/05/13 2100 07/05/13 2138 07/06/13 0500 07/06/13 0605  BP: 105/86 148/76  125/75  Pulse: 64 87  80  Temp:  97.8 F (36.6 C)  97.9 F (36.6 C)  TempSrc:  Oral  Oral  Resp: 31 22  18   Height:  5\' 3"  (1.6 m)    Weight:  94 kg (207 lb 3.7 oz) 94 kg (207 lb 3.7 oz) 94.5 kg (208 lb 5.4 oz)  SpO2: 94% 96%  98%    Weight change:  Filed Weights   07/05/13 2138 07/06/13 0500 07/06/13 0605  Weight: 94 kg (207 lb 3.7 oz) 94  kg (207 lb 3.7 oz) 94.5 kg (208 lb 5.4 oz)   Body mass index is 36.91 kg/(m^2).   Gen Exam: Awake and alert with clear speech.   Neck: Supple, No JVD.   Chest: B/L Clear.   CVS: S1 S2 Regular, no murmurs.  Abdomen: soft, BS +, non tender, non distended.  Extremities: trace edema, lower extremities warm to touch. Neurologic: Non Focal.   Skin: No Rash.   Wounds: N/A.   Intake/Output from previous day:  Intake/Output Summary (Last 24 hours) at 07/06/13 1141 Last data  filed at 07/06/13 0948  Gross per 24 hour  Intake      0 ml  Output   1575 ml  Net  -1575 ml     LAB RESULTS: CBC  Recent Labs Lab 07/05/13 1448 07/06/13 0740  WBC 5.2 5.2  HGB 11.5* 11.5*  HCT 35.3* 35.6*  PLT 110* 85*  MCV 86.7 86.6  MCH 28.3 28.0  MCHC 32.6 32.3  RDW 16.1* 15.9*    Chemistries   Recent Labs Lab 07/05/13 1448 07/06/13 0740  NA 133* 137  K 4.1 3.3*  CL 88* 91*  CO2 31 35*  GLUCOSE 233* 126*  BUN 99* 93*  CREATININE 3.90* 3.34*  CALCIUM 9.5 9.6    CBG:  Recent Labs Lab 07/05/13 2326 07/06/13 0743  GLUCAP 150* 129*    GFR Estimated Creatinine Clearance: 13.7 ml/min (by C-G formula based on Cr of 3.34).  Coagulation profile  Recent Labs Lab 07/01/13 07/06/13 0740  INR 3.0 3.28*    Cardiac Enzymes No results found for this basename: CK, CKMB, TROPONINI, MYOGLOBIN,  in the last 168 hours  No components found with this basename: POCBNP,  No results found for this basename: DDIMER,  in the last 72 hours No results found for this basename: HGBA1C,  in the last 72 hours No results found for this basename: CHOL, HDL, LDLCALC, TRIG, CHOLHDL, LDLDIRECT,  in the last 72 hours No results found for this basename: TSH, T4TOTAL, FREET3, T3FREE, THYROIDAB,  in the last 72 hours No results found for this basename: VITAMINB12, FOLATE, FERRITIN, TIBC, IRON, RETICCTPCT,  in the last 72 hours No results found for this basename: LIPASE, AMYLASE,  in the last 72 hours  Urine Studies No results found for this basename: UACOL, UAPR, USPG, UPH, UTP, UGL, UKET, UBIL, UHGB, UNIT, UROB, ULEU, UEPI, UWBC, URBC, UBAC, CAST, CRYS, UCOM, BILUA,  in the last 72 hours  MICROBIOLOGY: No results found for this or any previous visit (from the past 240 hour(s)).  RADIOLOGY STUDIES/RESULTS: Dg Chest 2 View  06/17/2013   *RADIOLOGY REPORT*  Clinical Data: Fatigue with shortness of breath and left-sided chest pain.  History hypertension.  CHEST - 2 VIEW   Comparison: 06/13/2013  Findings: Lateral view degraded by patient arm position.  Cardiomegaly with atherosclerosis in the transverse aorta. Moderate right-sided pleural effusion which is not significantly changed.  No left-sided pleural effusion. No pneumothorax.  Slight increase in mild interstitial edema.  No change in right base air space disease.  IMPRESSION: Cardiomegaly with slight increase in mild interstitial edema.  Right-sided pleural effusion with adjacent infection or atelectasis, unchanged.   Original Report Authenticated By: Jeronimo Greaves, M.D.   Dg Chest 2 View  06/13/2013   *RADIOLOGY REPORT*  Clinical Data: Weakness.  Gastroesophageal reflux disease.  COPD. Respiratory failure.  CHEST - 2 VIEW  Comparison: 05/03/2013  Findings: Lateral view degraded by patient arm position.  Moderate cardiomegaly with a tortuous  descending thoracic aorta. Moderate right-sided pleural effusion is increased.  No left-sided effusion. No pneumothorax.  Interstitial edema is mild and similar. Worsened right lower lobe airspace disease.  IMPRESSION: Mild congestive heart failure.  Worsened right-sided aeration with increased moderate pleural effusion and adjacent airspace disease.  This airspace disease is suspicious for concurrent infection. Recommend radiographic follow- up until clearing.   Original Report Authenticated By: Jeronimo Greaves, M.D.   Dg Chest Right Decubitus  06/18/2013   CLINICAL DATA:  Right pleural effusion.  EXAM: CHEST - RIGHT DECUBITUS  COMPARISON:  06/17/2013  FINDINGS: There is a small freely layering right pleural effusion.  IMPRESSION: Small, freely layering right pleural effusion.   Electronically Signed   By: Myles Rosenthal   On: 06/18/2013 20:40   Dg Esophagus  06/18/2013   CLINICAL DATA:  Dysphagia. Patient reports that food and pills sticking in the esophagus.  EXAM: ESOPHOGRAM/BARIUM SWALLOW  TECHNIQUE: Single contrast examination performed using thin barium liquid given the patient's  limited mobility.  COMPARISON:  Chest CT 03/21/2013.  FLUOROSCOPY TIME:  Twenty-eight seconds.  FINDINGS: Study was performed in the supine semi-erect position. Mucosal assessment is limited by the patient's limited mobility and inability to obtain air contrast views. The primary stripping wave is fairly well maintained. There is no evidence of stricture, mass or ulceration. There is a small hiatal hernia. A barium tablet passed without delay into the stomach. No significant reflux was elicited with the water siphon test.  IMPRESSION: Small hiatal hernia. No evidence of esophageal stricture or ulceration.   Electronically Signed   By: Roxy Horseman   On: 06/18/2013 13:01   Dg Chest Port 1 View  07/05/2013   *RADIOLOGY REPORT*  Clinical Data: Shortness of breath and leg swelling  PORTABLE CHEST - 1 VIEW  Comparison: 06/18/2013 and multiple prior chest radiographs  Findings: Cardiomegaly, right pleural effusion and increasing right lower lung atelectasis noted. The left lung is clear.  There is no evidence of pneumothorax. No acute bony abnormalities are present.  IMPRESSION: Cardiomegaly with right pleural effusion and increasing right lower lung atelectasis.   Original Report Authenticated By: Harmon Pier, M.D.    Jeoffrey Massed, MD  [facility] Pager:[phone]  If 7PM-7AM, please contact night-coverage [email or web address] Password [email or web address] 07/06/2013, 11:41 AM   LOS: 1 day

## 2013-07-06 NOTE — Care Management Note (Signed)
    Page 1 of 2   07/07/2013     11:00:28 AM   CARE MANAGEMENT NOTE 07/07/2013  Patient:  [facility]   Account Number:  [id number]  Date Initiated:  07/06/2013  Documentation initiated by:  Letha Cape  Subjective/Objective Assessment:   dx renal failure  admit- lives with daughter.  active with [facility] for HHRN, PT, Ot.  Also has home oxygen with [facility].     Action/Plan:   pt/ot eval- rec hhpt/ot with 24 hr   Anticipated DC Date:  07/07/2013   Anticipated DC Plan:  HOME W HOME HEALTH SERVICES      DC Planning Services  CM consult      [facility] Choice  Resumption Of Svcs/PTA Provider   Choice offered to / List presented to:  C-1 Patient        HH arranged  HH-1 RN  HH-2 PT  HH-3 OT      [facility] agency  [facility]   Status of service:  Completed, signed off Medicare Important Message given?   (If response is "NO", the following Medicare IM given date fields will be blank) Date Medicare IM given:   Date Additional Medicare IM given:    Discharge Disposition:  HOME W HOME HEALTH SERVICES  Per UR Regulation:  Reviewed for med. necessity/level of care/duration of stay  If discussed at Long Length of Stay Meetings, dates discussed:    Comments:  07/07/13 10:56 Letha Cape RN, BSN [phone] patient is for dc today, she is active with [facility] and would like to continue also has oxygen with [facility], [facility] notified that pat is for dc today and to continue services for [facility], PT, OT. Soc will begin 24-48 hrs post discharge.  Patient's daughter states they need a scale for CHF, I will get one for them from gift shop.  ALso Justin with [facility] notified to bring oxygen tank up for patient, they forgot to bring her oxygen.  9/2/214 14:11 Letha Cape RN, BSN [phone] patient lives with daughter,  pt/ot eva rec hhpt/ot with 24 hrl.  NCM will continue to follow for dc needs.

## 2013-07-06 NOTE — Progress Notes (Signed)
ANTICOAGULATION CONSULT NOTE - Follow Up Consult  Pharmacy Consult for Coumadin Indication: atrial fibrillation  Allergies  Allergen Reactions  . Morphine Sulfate Other (See Comments)    REACTION: change in personality  . Remeron [Mirtazapine] Other (See Comments)    Altered mental status, lethargy  . Prescott Gum [Fish Allergy] Other (See Comments)    unknown  . Penicillins Rash    Tolerates Ancef.    Patient Measurements: Height: 5\' 3"  (160 cm) Weight: 208 lb 5.4 oz (94.5 kg) IBW/kg (Calculated) : 52.4  Vital Signs: Temp: 97.9 F (36.6 C) (09/02 0605) Temp src: Oral (09/02 0605) BP: 125/75 mmHg (09/02 0605) Pulse Rate: 80 (09/02 0605)  Labs:  Recent Labs  07/05/13 1448 07/06/13 0740  HGB 11.5* 11.5*  HCT 35.3* 35.6*  PLT 110* 85*  LABPROT  --  32.2*  INR  --  3.28*  CREATININE 3.90*  --     Estimated Creatinine Clearance: 11.7 ml/min (by C-G formula based on Cr of 3.9).   Medical History: Past Medical History  Diagnosis Date  . GERD (gastroesophageal reflux disease)   . Hiatal hernia   . HTN (hypertension)     all her life  . Atrial fibrillation     since 1996  . Depression   . Chronic diastolic heart failure 08/31/2012  . Tibia fracture     BILATERAL  . Respiratory failure     acute on chronic hx of  requiring BIPAP  . COPD (chronic obstructive pulmonary disease)   . Metabolic encephalopathy     hx of   . UTI (urinary tract infection)     hx of   . Pleural effusion     right sided now resolved   . CHF (congestive heart failure)     diastolic HF  . Complication of anesthesia     "hard to wake up" (03/11/2013)  . OSA on CPAP   . Type II diabetes mellitus 1980's?  . History of blood transfusion 08/2012    "related to leg OR" (03/11/2013)  . Arthritis     "left arm; lower back, hips, hands" (03/11/2013)  . Chronic lower back pain     "disc pops in and out" (03/11/2013)  . Gout   . Anxiety   . Fall 08/2012    "in nursing home" (03/11/2013)  . Anemia    . Chronic a-fib 03/12/2013  . CKD (chronic kidney disease), stage III     GFR: 38  . Renal failure     acute on chronic   . Renal failure     acute on chronic with need for intermittent dialysis - hx of     Medications:  Prescriptions prior to admission  Medication Sig Dispense Refill  . acetaminophen (TYLENOL) 325 MG tablet Take 650 mg by mouth every 6 (six) hours as needed for pain.      Marland Kitchen allopurinol (ZYLOPRIM) 100 MG tablet Take 100 mg by mouth daily.      Marland Kitchen ALPRAZolam (XANAX) 0.25 MG tablet Take 0.125 mg by mouth every 8 (eight) hours as needed for anxiety.      Marland Kitchen amiodarone (PACERONE) 200 MG tablet Take 400 mg by mouth daily.      . busPIRone (BUSPAR) 5 MG tablet Take 5 mg by mouth every 12 (twelve) hours.       . carvedilol (COREG) 25 MG tablet Take 25 mg by mouth 2 (two) times daily with a meal.      . docusate sodium (COLACE) 100  MG capsule Take 100 mg by mouth every 8 (eight) hours.       . fluticasone (FLONASE) 50 MCG/ACT nasal spray Place 1 spray into the nose daily.      Marland Kitchen guaiFENesin (MUCINEX) 600 MG 12 hr tablet Take 600 mg by mouth 2 (two) times daily.       Marland Kitchen HYDROcodone-acetaminophen (NORCO) 7.5-325 MG per tablet Take 1 tablet by mouth at bedtime as needed for pain.      Marland Kitchen insulin aspart (NOVOLOG) 100 UNIT/ML injection Inject 5 Units into the skin 3 (three) times daily with meals. If cbg is greater than 150      . insulin glargine (LANTUS) 100 UNIT/ML injection Inject 5 Units into the skin at bedtime.      . metolazone (ZAROXOLYN) 2.5 MG tablet Take 2.5 mg every Wednesday or if weight greater than 205 lbs  15 tablet  6  . nitroGLYCERIN (NITROSTAT) 0.4 MG SL tablet Place 0.4 mg under the tongue every 5 (five) minutes as needed for chest pain.      Marland Kitchen nystatin (MYCOSTATIN) 100000 UNIT/ML suspension Take 5 mL (500,000 Units total) by mouth 4 (four) times daily.  60 mL  0  . omeprazole (PRILOSEC) 20 MG capsule Take 40 mg by mouth daily.       . ondansetron (ZOFRAN) 4 MG  tablet Take 1 tablet (4 mg total) by mouth every 6 (six) hours as needed for nausea.  20 tablet    . polyethylene glycol (MIRALAX / GLYCOLAX) packet Take 17 g by mouth 2 (two) times daily.       Marland Kitchen torsemide (DEMADEX) 20 MG tablet Take 4 tablets (80 mg total) by mouth 2 (two) times daily.  60 tablet  0  . traZODone (DESYREL) 50 MG tablet Take 50 mg by mouth at bedtime.      Marland Kitchen warfarin (COUMADIN) 5 MG tablet Take 2.5 mg by mouth daily.      . Melatonin 3 MG CAPS Take 1 capsule by mouth at bedtime.       Scheduled:  . allopurinol  100 mg Oral Daily  . amiodarone  400 mg Oral Daily  . busPIRone  5 mg Oral Q12H  . carvedilol  25 mg Oral BID WC  . docusate sodium  100 mg Oral Q8H  . fluticasone  1 spray Each Nare Daily  . guaiFENesin  600 mg Oral BID  . insulin aspart  0-8 Units Subcutaneous TID WC  . nystatin  5 mL Oral QID  . pantoprazole  80 mg Oral Daily  . polyethylene glycol  17 g Oral BID  . sodium chloride  3 mL Intravenous Q12H  . traZODone  50 mg Oral QHS  . Warfarin - Pharmacist Dosing Inpatient   Does not apply q1800    Assessment/Plan: 79yo female c/o wt gain and SOB related to CHF, also in acute on CRF, to continue Coumadin for Afib  Her INR remains elevated.  No coumadin today F/u daily PT/INR  Talbert Cage, PharmD 07/06/2013,8:50 AM

## 2013-07-06 NOTE — Progress Notes (Signed)
Gina Ashley [id number] Admitted to 5W11: 07/06/2013 2:34 AM Attending Provider: Hillary Bow, DO    Gina Ashley is a 79 y.o. female patient admitted from ED awake, alert  & orientated  X 3,  Full Code, VSS - Blood pressure 148/76, pulse 87, temperature 97.8 F (36.6 C), temperature source Oral, resp. rate 22, height 5\' 3"  (1.6 m), weight 94 kg (207 lb 3.7 oz), SpO2 96.00%., O2    2 L nasal cannular, no c/o shortness of breath, no c/o chest pain, no distress noted. Tele # [id number] placed and pt is currently running:afib.   IV site WDL:  forearm right, condition patent and no redness with a transparent dsg that's clean dry and intact.  Allergies:   Allergies  Allergen Reactions  . Morphine Sulfate Other (See Comments)    REACTION: change in personality  . Remeron [Mirtazapine] Other (See Comments)    Altered mental status, lethargy  . Prescott Gum [Fish Allergy] Other (See Comments)    unknown  . Penicillins Rash    Tolerates Ancef.     Past Medical History  Diagnosis Date  . GERD (gastroesophageal reflux disease)   . Hiatal hernia   . HTN (hypertension)     all her life  . Atrial fibrillation     since 1996  . Depression   . Chronic diastolic heart failure 08/31/2012  . Tibia fracture     BILATERAL  . Respiratory failure     acute on chronic hx of  requiring BIPAP  . COPD (chronic obstructive pulmonary disease)   . Metabolic encephalopathy     hx of   . UTI (urinary tract infection)     hx of   . Pleural effusion     right sided now resolved   . CHF (congestive heart failure)     diastolic HF  . Complication of anesthesia     "hard to wake up" (03/11/2013)  . OSA on CPAP   . Type II diabetes mellitus 1980's?  . History of blood transfusion 08/2012    "related to leg OR" (03/11/2013)  . Arthritis     "left arm; lower back, hips, hands" (03/11/2013)  . Chronic lower back pain     "disc pops in and out" (03/11/2013)  . Gout   . Anxiety   . Fall 08/2012    "in nursing home"  (03/11/2013)  . Anemia   . Chronic a-fib 03/12/2013  . CKD (chronic kidney disease), stage III     GFR: 38  . Renal failure     acute on chronic   . Renal failure     acute on chronic with need for intermittent dialysis - hx of     History:  obtained from daughter.  Pt orientation to unit, room and routine. Information packet given to patient/family and safety video watched.  Admission INP armband ID verified with patient/family, and in place. SR up x 2, fall risk assessment complete with Patient and family verbalizing understanding of risks associated with falls. Pt verbalizes an understanding of how to use the call bell and to call for help before getting out of bed.  Skin, clean-dry- intact without evidence of bruising, or skin tears.   No evidence of skin break down noted on exam.    Will cont to monitor and assist as needed.  Tessie Eke, RN 07/06/2013 2:34 AM

## 2013-07-07 DIAGNOSIS — N189 Chronic kidney disease, unspecified: Secondary | ICD-10-CM

## 2013-07-07 LAB — RENAL FUNCTION PANEL
Albumin: 3.4 g/dL — ABNORMAL LOW (ref 3.5–5.2)
BUN: 83 mg/dL — ABNORMAL HIGH (ref 6–23)
Chloride: 96 mEq/L (ref 96–112)
Creatinine, Ser: 2.59 mg/dL — ABNORMAL HIGH (ref 0.50–1.10)
GFR calc non Af Amer: 16 mL/min — ABNORMAL LOW (ref >90)
Phosphorus: 3.7 mg/dL (ref 2.3–4.6)
Potassium: 3.2 mEq/L — ABNORMAL LOW (ref 3.5–5.1)

## 2013-07-07 LAB — CBC
MCHC: 32.9 g/dL (ref 30.0–36.0)
Platelets: 87 10*3/uL — ABNORMAL LOW (ref 150–400)
RDW: 15.8 % — ABNORMAL HIGH (ref 11.5–15.5)
WBC: 5.5 10*3/uL (ref 4.0–10.5)

## 2013-07-07 LAB — GLUCOSE, CAPILLARY: Glucose-Capillary: 270 mg/dL — ABNORMAL HIGH (ref 70–99)

## 2013-07-07 LAB — PROTIME-INR: INR: 3.03 — ABNORMAL HIGH (ref 0.00–1.49)

## 2013-07-07 MED ORDER — POTASSIUM CHLORIDE CRYS ER 20 MEQ PO TBCR
40.0000 meq | EXTENDED_RELEASE_TABLET | Freq: Once | ORAL | Status: AC
  Administered 2013-07-07: 14:00:00 40 meq via ORAL
  Filled 2013-07-07: qty 2

## 2013-07-07 MED ORDER — CARVEDILOL 12.5 MG PO TABS: 12.5000 mg | ORAL_TABLET | Freq: Two times a day (BID) | ORAL | Status: DC

## 2013-07-07 MED ORDER — OXYCODONE HCL 5 MG PO TABS: 5.0000 mg | ORAL_TABLET | Freq: Four times a day (QID) | ORAL | Status: DC | PRN

## 2013-07-07 MED ORDER — WARFARIN SODIUM 2.5 MG PO TABS
2.5000 mg | ORAL_TABLET | Freq: Once | ORAL | Status: DC
  Filled 2013-07-07: qty 1

## 2013-07-07 MED ORDER — ENSURE COMPLETE PO LIQD: 237.0000 mL | Freq: Two times a day (BID) | ORAL | Status: DC

## 2013-07-07 MED ORDER — POTASSIUM CHLORIDE CRYS ER 20 MEQ PO TBCR: 20.0000 meq | EXTENDED_RELEASE_TABLET | Freq: Every day | ORAL | Status: DC

## 2013-07-07 NOTE — Consult Note (Signed)
Reason for Consult:AKI on CKD Referring Physician: Dr. Fayne Norrie Mchaney is an 79 y.o. female.  HPI: Ms. Bredeson is an 79 yo AA woman with pmh of Afib on anticoagulation with coumadin, CHF with last EF 55-60% on 3/14, IDDM, COPD, CKD stage IV (GFR 38) and hx of MDR K. pneumoniae UTI. Pt was recently admitted on 8/14-8/16 for generalized weakness with etiology thought to be untreated chronic UTI. ID was consulted and pt was given fosfomycin and levofloxacin for suspected PNA. Pt then had some worsening generalized weakness, muscle aches, itchiness, and periods of delayed thought process and so daughters brought into ED. Pt was not fluid overloaded on PE in ED, Wt was decreased from baseline 197lbs (208lb with goal of 208-215lbs) but noticed to have AKI 3.9 (2.8) and BUN 99. Pt had AG 14 and ABG showed chronic compensated primary respiratory alkalosis in setting of COPD.   Pt has hx of cardiorenal syndrome back in 12/13 where she had a RIJ HD cath and completed 4 sessions of HD w/o complication but was not considered a good candidate for future permanent HD access. This was discussed and agreed upon by family and primary cardiologist Dr. Julious Payer. Pt does not have any permanent or temporary HD access at this time.   Pt and daughters whom are primary caregivers state that the patient is interacting better today and pt herself states is less fatigued. She denied any productive cough, SOB, DOE from baseline, CP, increased LE edema, rashes, n/v/d. Daughters did notice that patient has had decreased PO intake over the last couple of days as well. Pt did report itchiness that has decreased since admission, ongoing tooth pain that has increased recently w/in the last week, "fogginess", pyuria, urinary frequency, and some suprapubic pain. Pt has had no sick contacts and has been compliant with medications.   Per chart review patient seems to have DNR gold form and MOST forms that were completed during 03/2013  admission and that goals of care would be to not pursue HD.   Past Medical History  Diagnosis Date  . GERD (gastroesophageal reflux disease)   . Hiatal hernia   . HTN (hypertension)     all her life  . Atrial fibrillation     since 1996  . Depression   . Chronic diastolic heart failure 08/31/2012  . Tibia fracture     BILATERAL  . Respiratory failure     acute on chronic hx of  requiring BIPAP  . COPD (chronic obstructive pulmonary disease)   . Metabolic encephalopathy     hx of   . UTI (urinary tract infection)     hx of   . Pleural effusion     right sided now resolved   . CHF (congestive heart failure)     diastolic HF  . Complication of anesthesia     "hard to wake up" (03/11/2013)  . OSA on CPAP   . Type II diabetes mellitus 1980's?  . History of blood transfusion 08/2012    "related to leg OR" (03/11/2013)  . Arthritis     "left arm; lower back, hips, hands" (03/11/2013)  . Chronic lower back pain     "disc pops in and out" (03/11/2013)  . Gout   . Anxiety   . Fall 08/2012    "in nursing home" (03/11/2013)  . Anemia   . Chronic a-fib 03/12/2013  . CKD (chronic kidney disease), stage III     GFR: 38  . Renal failure  acute on chronic   . Renal failure     acute on chronic with need for intermittent dialysis - hx of     Past Surgical History  Procedure Laterality Date  . Bunionectomy Bilateral     bilateral  . Cholecystectomy    . Shoulder arthroscopy w/ rotator cuff repair Left 2002  . Total knee arthroplasty Bilateral 2001  . Hammer toe surgery Bilateral 2004  . Colonoscopy  07/11/2011    Procedure: COLONOSCOPY;  Surgeon: Malissa Hippo, MD;  Location: [facility];  Service: Endoscopy;  Laterality: N/A;  3:00  . Orif periprosthetic fracture  09/08/2012    Procedure: OPEN REDUCTION INTERNAL FIXATION (ORIF) PERIPROSTHETIC FRACTURE;  Surgeon: Shelda Pal, MD;  Location: [facility];  Service: Orthopedics;  Laterality: Right;  ORIF periprosthetic right proximal  femur fracture   . External fixation leg  09/08/2012    Procedure: EXTERNAL FIXATION LEG;  Surgeon: Shelda Pal, MD;  Location: [facility];  Service: Orthopedics;  Laterality: Left;  . Joint replacement    . External fixation leg  11/30/2012    Procedure: EXTERNAL FIXATION LEG;  Surgeon: Shelda Pal, MD;  Location: [facility];  Service: Orthopedics;  Laterality: Left;  Removal of External Fixation Left Knee with Evaluation with Floroscopy  . Tee without cardioversion N/A 01/19/2013    Procedure: TRANSESOPHAGEAL ECHOCARDIOGRAM (TEE);  Surgeon: Lewayne Bunting, MD;  Location: [facility] ENDOSCOPY;  Service: Cardiovascular;  Laterality: N/A;  . Shoulder open rotator cuff repair Left 2002    "maybe 3 months after scope" (03/11/2013)  . Femur fracture surgery Right 08/2012    "fell at nursing home" (03/11/2013)  . Tibia fracture surgery Left 08/2012    "fell at nursing home" (03/11/2013)    Family History  Problem Relation Age of Onset  . Colon cancer Brother     Social History:  reports that she has never smoked. She has never used smokeless tobacco. She reports that she does not drink alcohol or use illicit drugs.  Allergies:  Allergies  Allergen Reactions  . Morphine Sulfate Other (See Comments)    REACTION: change in personality  . Remeron [Mirtazapine] Other (See Comments)    Altered mental status, lethargy  . Prescott Gum [Fish Allergy] Other (See Comments)    unknown  . Penicillins Rash    Tolerates Ancef.    Medications: I have reviewed the patient's current medications. Prescriptions prior to admission  Medication Sig Dispense Refill  . acetaminophen (TYLENOL) 325 MG tablet Take 650 mg by mouth every 6 (six) hours as needed for pain.      Marland Kitchen allopurinol (ZYLOPRIM) 100 MG tablet Take 100 mg by mouth daily.      Marland Kitchen ALPRAZolam (XANAX) 0.25 MG tablet Take 0.125 mg by mouth every 8 (eight) hours as needed for anxiety.      Marland Kitchen amiodarone (PACERONE) 200 MG tablet Take 400 mg by mouth daily.      .  busPIRone (BUSPAR) 5 MG tablet Take 5 mg by mouth every 12 (twelve) hours.       . docusate sodium (COLACE) 100 MG capsule Take 100 mg by mouth every 8 (eight) hours.       . fluticasone (FLONASE) 50 MCG/ACT nasal spray Place 1 spray into the nose daily.      Marland Kitchen guaiFENesin (MUCINEX) 600 MG 12 hr tablet Take 600 mg by mouth 2 (two) times daily.       . insulin aspart (NOVOLOG) 100 UNIT/ML injection Inject 5  Units into the skin 3 (three) times daily with meals. If cbg is greater than 150      . nitroGLYCERIN (NITROSTAT) 0.4 MG SL tablet Place 0.4 mg under the tongue every 5 (five) minutes as needed for chest pain.      Marland Kitchen nystatin (MYCOSTATIN) 100000 UNIT/ML suspension Take 5 mL (500,000 Units total) by mouth 4 (four) times daily.  60 mL  0  . omeprazole (PRILOSEC) 20 MG capsule Take 40 mg by mouth daily.       . ondansetron (ZOFRAN) 4 MG tablet Take 1 tablet (4 mg total) by mouth every 6 (six) hours as needed for nausea.  20 tablet    . polyethylene glycol (MIRALAX / GLYCOLAX) packet Take 17 g by mouth 2 (two) times daily.       Marland Kitchen torsemide (DEMADEX) 20 MG tablet Take 4 tablets (80 mg total) by mouth 2 (two) times daily.  60 tablet  0  . traZODone (DESYREL) 50 MG tablet Take 50 mg by mouth at bedtime.      Marland Kitchen warfarin (COUMADIN) 5 MG tablet Take 2.5 mg by mouth daily.      . [DISCONTINUED] carvedilol (COREG) 25 MG tablet Take 25 mg by mouth 2 (two) times daily with a meal.      . [DISCONTINUED] HYDROcodone-acetaminophen (NORCO) 7.5-325 MG per tablet Take 1 tablet by mouth at bedtime as needed for pain.      . [DISCONTINUED] insulin glargine (LANTUS) 100 UNIT/ML injection Inject 5 Units into the skin at bedtime.      . [DISCONTINUED] metolazone (ZAROXOLYN) 2.5 MG tablet Take 2.5 mg every Wednesday or if weight greater than 205 lbs  15 tablet  6  . Melatonin 3 MG CAPS Take 1 capsule by mouth at bedtime.         Results for orders placed during the hospital encounter of 07/05/13 (from the past 48  hour(s))  PRO B NATRIURETIC PEPTIDE     Status: Abnormal   Collection Time    07/05/13  2:48 PM      Result Value Range   Pro B Natriuretic peptide (BNP) 3388.0 (*) 0 - 450 pg/mL  BASIC METABOLIC PANEL     Status: Abnormal   Collection Time    07/05/13  2:48 PM      Result Value Range   Sodium 133 (*) 135 - 145 mEq/L   Potassium 4.1  3.5 - 5.1 mEq/L   Chloride 88 (*) 96 - 112 mEq/L   CO2 31  19 - 32 mEq/L   Glucose, Bld 233 (*) 70 - 99 mg/dL   BUN 99 (*) 6 - 23 mg/dL   Creatinine, Ser 5.62 (*) 0.50 - 1.10 mg/dL   Calcium 9.5  8.4 - 13.0 mg/dL   GFR calc non Af Amer 10 (*) >90 mL/min   GFR calc Af Amer 11 (*) >90 mL/min   Comment: (NOTE)     The eGFR has been calculated using the CKD EPI equation.     This calculation has not been validated in all clinical situations.     eGFR's persistently <90 mL/min signify possible Chronic Kidney     Disease.  CBC     Status: Abnormal   Collection Time    07/05/13  2:48 PM      Result Value Range   WBC 5.2  4.0 - 10.5 K/uL   RBC 4.07  3.87 - 5.11 MIL/uL   Hemoglobin 11.5 (*) 12.0 - 15.0 g/dL  HCT 35.3 (*) 36.0 - 46.0 %   MCV 86.7  78.0 - 100.0 fL   MCH 28.3  26.0 - 34.0 pg   MCHC 32.6  30.0 - 36.0 g/dL   RDW 16.1 (*) 09.6 - 04.5 %   Platelets 110 (*) 150 - 400 K/uL   Comment: PLATELET COUNT CONFIRMED BY SMEAR     REPEATED TO VERIFY     SPECIMEN CHECKED FOR CLOTS  POCT I-STAT TROPONIN I     Status: None   Collection Time    07/05/13  3:09 PM      Result Value Range   Troponin i, poc 0.00  0.00 - 0.08 ng/mL   Comment 3            Comment: Due to the release kinetics of cTnI,     a negative result within the first hours     of the onset of symptoms does not rule out     myocardial infarction with certainty.     If myocardial infarction is still suspected,     repeat the test at appropriate intervals.  URINALYSIS, ROUTINE W REFLEX MICROSCOPIC     Status: Abnormal   Collection Time    07/05/13  3:42 PM      Result Value Range    Color, Urine YELLOW  YELLOW   APPearance HAZY (*) CLEAR   Specific Gravity, Urine 1.011  1.005 - 1.030   pH 5.5  5.0 - 8.0   Glucose, UA NEGATIVE  NEGATIVE mg/dL   Hgb urine dipstick TRACE (*) NEGATIVE   Bilirubin Urine NEGATIVE  NEGATIVE   Ketones, ur NEGATIVE  NEGATIVE mg/dL   Protein, ur NEGATIVE  NEGATIVE mg/dL   Urobilinogen, UA 0.2  0.0 - 1.0 mg/dL   Nitrite NEGATIVE  NEGATIVE   Leukocytes, UA MODERATE (*) NEGATIVE  URINE CULTURE     Status: None   Collection Time    07/05/13  3:42 PM      Result Value Range   Specimen Description URINE, CATHETERIZED     Special Requests NONE     Culture  Setup Time       Value: 07/04/2013 15:57     Performed at [facility] Count       Value: >=100,000 COLONIES/ML     Performed at [facility]   Culture       Value: GRAM NEGATIVE RODS     Performed at [facility]   Report Status PENDING    URINE MICROSCOPIC-ADD ON     Status: Abnormal   Collection Time    07/05/13  3:42 PM      Result Value Range   Squamous Epithelial / LPF FEW (*) RARE   WBC, UA 11-20  <3 WBC/hpf   RBC / HPF 0-2  <3 RBC/hpf   Bacteria, UA MANY (*) RARE  BLOOD GAS, ARTERIAL     Status: Abnormal   Collection Time    07/05/13  9:55 PM      Result Value Range   FIO2 0.28     Delivery systems NASAL CANNULA     pH, Arterial 7.397  7.350 - 7.450   pCO2 arterial 59.7 (*) 35.0 - 45.0 mmHg   Comment: CRITICAL RESULT CALLED TO, READ BACK BY AND VERIFIED WITH:     BOBBY SCOTT, RN AT 2210, BY LEE COOK,RRT, RCP ON 09     CRITICAL RESULT CALLED TO, READ BACK BY AND VERIFIED WITH:  BOBBY SCOTT, RN AT 2210, BY LEE COOK,RRT, RCP ON 07/06/13   pO2, Arterial 72.8 (*) 80.0 - 100.0 mmHg   Bicarbonate 36.0 (*) 20.0 - 24.0 mEq/L   TCO2 37.8  0 - 100 mmol/L   Acid-Base Excess 10.8 (*) 0.0 - 2.0 mmol/L   O2 Saturation 94.6     Patient temperature 98.6     Collection site LEFT RADIAL     Drawn by [phone]     Sample type ARTERIAL DRAW      Allens test (pass/fail) PASS  PASS  GLUCOSE, CAPILLARY     Status: Abnormal   Collection Time    07/05/13 11:26 PM      Result Value Range   Glucose-Capillary 150 (*) 70 - 99 mg/dL   Comment 1 Documented in Chart     Comment 2 Notify RN    CBC     Status: Abnormal   Collection Time    07/06/13  7:40 AM      Result Value Range   WBC 5.2  4.0 - 10.5 K/uL   RBC 4.11  3.87 - 5.11 MIL/uL   Hemoglobin 11.5 (*) 12.0 - 15.0 g/dL   HCT 84.6 (*) 96.2 - 95.2 %   MCV 86.6  78.0 - 100.0 fL   MCH 28.0  26.0 - 34.0 pg   MCHC 32.3  30.0 - 36.0 g/dL   RDW 84.1 (*) 32.4 - 40.1 %   Platelets 85 (*) 150 - 400 K/uL   Comment: CONSISTENT WITH PREVIOUS RESULT  COMPREHENSIVE METABOLIC PANEL     Status: Abnormal   Collection Time    07/06/13  7:40 AM      Result Value Range   Sodium 137  135 - 145 mEq/L   Potassium 3.3 (*) 3.5 - 5.1 mEq/L   Chloride 91 (*) 96 - 112 mEq/L   CO2 35 (*) 19 - 32 mEq/L   Glucose, Bld 126 (*) 70 - 99 mg/dL   BUN 93 (*) 6 - 23 mg/dL   Creatinine, Ser 0.27 (*) 0.50 - 1.10 mg/dL   Calcium 9.6  8.4 - 25.3 mg/dL   Total Protein 6.9  6.0 - 8.3 g/dL   Albumin 3.5  3.5 - 5.2 g/dL   AST 20  0 - 37 U/L   ALT 11  0 - 35 U/L   Alkaline Phosphatase 163 (*) 39 - 117 U/L   Total Bilirubin 0.7  0.3 - 1.2 mg/dL   GFR calc non Af Amer 12 (*) >90 mL/min   GFR calc Af Amer 14 (*) >90 mL/min   Comment: (NOTE)     The eGFR has been calculated using the CKD EPI equation.     This calculation has not been validated in all clinical situations.     eGFR's persistently <90 mL/min signify possible Chronic Kidney     Disease.  PROTIME-INR     Status: Abnormal   Collection Time    07/06/13  7:40 AM      Result Value Range   Prothrombin Time 32.2 (*) 11.6 - 15.2 seconds   INR 3.28 (*) 0.00 - 1.49  GLUCOSE, CAPILLARY     Status: Abnormal   Collection Time    07/06/13  7:43 AM      Result Value Range   Glucose-Capillary 129 (*) 70 - 99 mg/dL   Comment 1 Documented in Chart     Comment 2  Notify RN    GLUCOSE, CAPILLARY  Status: Abnormal   Collection Time    07/06/13 11:43 AM      Result Value Range   Glucose-Capillary 172 (*) 70 - 99 mg/dL   Comment 1 Documented in Chart     Comment 2 Notify RN    SODIUM, URINE, RANDOM     Status: None   Collection Time    07/06/13  2:47 PM      Result Value Range   Sodium, Ur 82    CREATININE, URINE, RANDOM     Status: None   Collection Time    07/06/13  2:47 PM      Result Value Range   Creatinine, Urine 27.52    GLUCOSE, CAPILLARY     Status: Abnormal   Collection Time    07/06/13  5:11 PM      Result Value Range   Glucose-Capillary 199 (*) 70 - 99 mg/dL   Comment 1 Documented in Chart     Comment 2 Notify RN    GLUCOSE, CAPILLARY     Status: Abnormal   Collection Time    07/06/13  9:09 PM      Result Value Range   Glucose-Capillary 213 (*) 70 - 99 mg/dL   Comment 1 Documented in Chart     Comment 2 Notify RN    PROTIME-INR     Status: Abnormal   Collection Time    07/07/13  6:00 AM      Result Value Range   Prothrombin Time 30.3 (*) 11.6 - 15.2 seconds   INR 3.03 (*) 0.00 - 1.49  CBC     Status: Abnormal   Collection Time    07/07/13  6:00 AM      Result Value Range   WBC 5.5  4.0 - 10.5 K/uL   RBC 4.02  3.87 - 5.11 MIL/uL   Hemoglobin 11.5 (*) 12.0 - 15.0 g/dL   HCT 11.9 (*) 14.7 - 82.9 %   MCV 87.1  78.0 - 100.0 fL   MCH 28.6  26.0 - 34.0 pg   MCHC 32.9  30.0 - 36.0 g/dL   RDW 56.2 (*) 13.0 - 86.5 %   Platelets 87 (*) 150 - 400 K/uL   Comment: CONSISTENT WITH PREVIOUS RESULT  RENAL FUNCTION PANEL     Status: Abnormal   Collection Time    07/07/13  6:00 AM      Result Value Range   Sodium 146 (*) 135 - 145 mEq/L   Potassium 3.2 (*) 3.5 - 5.1 mEq/L   Chloride 96  96 - 112 mEq/L   CO2 39 (*) 19 - 32 mEq/L   Glucose, Bld 168 (*) 70 - 99 mg/dL   BUN 83 (*) 6 - 23 mg/dL   Creatinine, Ser 7.84 (*) 0.50 - 1.10 mg/dL   Calcium 9.6  8.4 - 69.6 mg/dL   Phosphorus 3.7  2.3 - 4.6 mg/dL   Albumin 3.4 (*)  3.5 - 5.2 g/dL   GFR calc non Af Amer 16 (*) >90 mL/min   GFR calc Af Amer 18 (*) >90 mL/min   Comment: (NOTE)     The eGFR has been calculated using the CKD EPI equation.     This calculation has not been validated in all clinical situations.     eGFR's persistently <90 mL/min signify possible Chronic Kidney     Disease.  GLUCOSE, CAPILLARY     Status: Abnormal   Collection Time    07/07/13  8:02 AM  Result Value Range   Glucose-Capillary 157 (*) 70 - 99 mg/dL  GLUCOSE, CAPILLARY     Status: Abnormal   Collection Time    07/07/13  1:33 PM      Result Value Range   Glucose-Capillary 270 (*) 70 - 99 mg/dL    US Renal  11/09/1094   *RADIOLOGY REPORT*  Clinical Data: Acute kidney injury and renal insufficiency.  RENAL/URINARY TRACT ULTRASOUND COMPLETE  Comparison:  08/30/2012  Findings:  Right Kidney:  11 cm in estimated length.  A 1.2 cm cyst in the upper kidney has a benign appearance.  The renal cortex is mildly echogenic, likely reflecting chronic kidney disease.  There is no evidence of hydronephrosis.  Left Kidney:  11.9 cm in estimated length.  The renal cortex is mildly echogenic, consistent with chronic disease.  No hydronephrosis is identified.  No focal lesion is seen.  Bladder:  The bladder is decompressed by a Foley catheter.  IMPRESSION: No evidence of renal obstruction.  Mild increased echogenicity of the renal cortex bilaterally is consistent with chronic disease.   Original Report Authenticated By: Irish Lack, M.D.   Dg Chest Port 1 View  07/05/2013   *RADIOLOGY REPORT*  Clinical Data: Shortness of breath and leg swelling  PORTABLE CHEST - 1 VIEW  Comparison: 06/18/2013 and multiple prior chest radiographs  Findings: Cardiomegaly, right pleural effusion and increasing right lower lung atelectasis noted. The left lung is clear.  There is no evidence of pneumothorax. No acute bony abnormalities are present.  IMPRESSION: Cardiomegaly with right pleural effusion and increasing  right lower lung atelectasis.   Original Report Authenticated By: Harmon Pier, M.D.    ROS: otherwise negative unless listed in HPI  Blood pressure 144/95, pulse 85, temperature 97.9 F (36.6 C), temperature source Oral, resp. rate 20, height 5\' 3"  (1.6 m), weight 198 lb 13.7 oz (90.2 kg), SpO2 97.00%. General: resting in bed, in NAD, finishing her breakfast HEENT: Moist mucus membranes, poor dentition with visible frontal incisor caries, dentures for upper teeth  Cardiac: distant HS, RRR, no rubs, murmurs or gallops, no S3/S4 Pulm: decreased BS on RLL and RML otherwise CTA with no wheezes or crackles, moving normal volumes of air Abd: soft, nontender, nondistended, BS present, no suprapubic tenderness but suprapubic mass, no CVA tenderness Ext: warm and well perfused, trace pitting edema, TED hose in place bl Neuro: alert and oriented X3  Assessment/Plan: 1. Acute on chronic renal failure: AKI resolved. Creatinine back to baseline today at 2.59. AKI likely secondary to obstruction with increased urine output and improved Creatinine after Foley insertion. FeNa of 7.3%, consistent with postrenal AKI. Renal US with 1.2cm cyst in right kidney, echogenic changes c/w CKD. She has needed Foley catheter for atonic/neurogenic bladder for months and will likely need continuation of Foley after her discharge with Urology outpatient follow up.  -Continue Foley, pt to be discharged home with Foley in place.    2. HTN: BP stable today with SBP persistently in 140s. AKI improved.  -Would resume home torsemide  -no IVF at this time  -Continue Coreg to 12.5mg  BID.  Try to avoid low bp, keep Coreg at current dose and diurese.  3. Thrombocytopenia: Plts stable at 87 today with no signs of bleeding. INR therapeutic at 3.03.   -Trend CBC  4. Hypokalemia - mild, K of 3.2, replete with Kdur once  5. CM -per primary team. AKI resolved, would restart duretic Torsemide 160mg  bid, avoid Metalazone 6. Afib -  INR  therapeutic 7. Confusion - Much improved today, was likely secondary to acute changes in metabolic/renal disease.  8  BOO had foley 8 mon, would get out patient Urology eval.    Sara Chu D 07/07/2013, 2:01 PM  IM PGY-2 [phone] I have seen and examined this patient and agree with the plan of care  Seen, eval, examined, and discussed with resident and primary service.  Ok to d/c home.  Rec Urology eval. And send home with foley .  Kjell Brannen L 07/07/2013, 2:59 PM

## 2013-07-07 NOTE — Progress Notes (Signed)
ANTICOAGULATION CONSULT NOTE - Follow Up Consult  Pharmacy Consult for Coumadin Indication: atrial fibrillation  Allergies  Allergen Reactions  . Morphine Sulfate Other (See Comments)    REACTION: change in personality  . Remeron [Mirtazapine] Other (See Comments)    Altered mental status, lethargy  . Prescott Gum [Fish Allergy] Other (See Comments)    unknown  . Penicillins Rash    Tolerates Ancef.    Patient Measurements: Height: 5\' 3"  (160 cm) Weight: 198 lb 13.7 oz (90.2 kg) IBW/kg (Calculated) : 52.4  Vital Signs: Temp: 99.3 F (37.4 C) (09/03 0645) Temp src: Oral (09/03 0645) BP: 143/89 mmHg (09/03 0927) Pulse Rate: 76 (09/03 0927)  Labs:  Recent Labs  07/05/13 1448 07/06/13 0740 07/07/13 0600  HGB 11.5* 11.5* 11.5*  HCT 35.3* 35.6* 35.0*  PLT 110* 85* 87*  LABPROT  --  32.2* 30.3*  INR  --  3.28* 3.03*  CREATININE 3.90* 3.34* 2.59*    Estimated Creatinine Clearance: 17.2 ml/min (by C-G formula based on Cr of 2.59).   Medications:  Scheduled:  . allopurinol  100 mg Oral Daily  . amiodarone  400 mg Oral Daily  . busPIRone  5 mg Oral Q12H  . carvedilol  12.5 mg Oral BID WC  . docusate sodium  100 mg Oral Q8H  . feeding supplement  237 mL Oral BID BM  . fluticasone  1 spray Each Nare Daily  . guaiFENesin  600 mg Oral BID  . insulin aspart  0-8 Units Subcutaneous TID WC  . pantoprazole  80 mg Oral Daily  . polyethylene glycol  17 g Oral BID  . potassium chloride  40 mEq Oral Once  . sodium chloride  3 mL Intravenous Q12H  . traZODone  50 mg Oral QHS  . Warfarin - Pharmacist Dosing Inpatient   Does not apply q1800    Assessment: 79 yo F presented to ED 9/1 with SOB related to HF and CRF.  Pt has hx of Afib and was on Coumadin PTA.  INR on admission was elevated.  INR now trending down to 3.03.  Will restart home Coumadin dose tonight of 2.5mg .  Considered lower dose with recent elevated INR but anticipate INR will continue to fall as a reflection of no  Coumadin doses on 9/1 and 9/2.  Goal of Therapy:  INR 2-3 Monitor platelets by anticoagulation protocol: Yes   Plan:  Coumadin 2.5 mg PO x 1 tonight. Continue daily INR.  [employer], Pharm.D., BCPS Clinical Pharmacist Pager [phone] 07/07/2013 10:13 AM

## 2013-07-07 NOTE — Progress Notes (Signed)
Patient discharge teaching give to pt and caregiver, including activity, diet, follow-up appoints, and medications and CHF care. Patient verbalized understanding of all discharge instructions. IV access was d/c'd. Vitals are stable. Skin is intact except as charted in most recent assessments. Pt to be escorted out by NT, to be driven home by family. Pt sent home with foley cath in place per md order.

## 2013-07-07 NOTE — Discharge Summary (Addendum)
Physician Discharge Summary  [name] ZOX:[id number] DOB: 02/03/1927 DOA: 07/05/2013  PCP: Kimber Relic, MD  Admit date: 07/05/2013 Discharge date: 07/07/2013  Time spent: 50  minutes  Recommendations for Outpatient Follow-up:  1. Home health RN, PT, OT 2. Follow up at Heart Failure clinic on 9/4 3.  BMET in 4-7 days.  Patient's diuretics and BP meds have been adjusted by nephrology. 4. 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 5.  INR check on 9/5 to dose warfarin.  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  Foley in place  Diet recommendation: Heart Healthy  Filed Weights   07/06/13 8119 07/07/13 0645 07/07/13 1405  Weight: 94.5 kg (208 lb 5.4 oz) 90.2 kg (198 lb 13.7 oz) 89.6 kg (197 lb 8.5 oz)    History of present illness at the time of admission:  Gina Ashley is a 79 y.o. female with pmh of CHF, cardio-renal syndrome, UTI with KPC for which she was just discharged some 15 days ago, who presents to the ED with worsening generalized weakness for the past couple of days. This is characterized by decreasing ability to move about from her bed to the wheelchair, and get to the commode. She denies fever, chills, chest pain, shortness of breath at rest, nausea, vomiting, or constipation. She has mild dyspnea on exertion. She has urinary hesitancy without dysuria, urinary frequency, or hematuria. She has been eating well. There are no other known modifying factors. In the ED she was found to be in AKI on CKD with creatinine of 3.9 (2.2), BUN 99 (54), she has a R pleural effusion as well.   Hospital Course:  Renal failure (ARF),  acute on chronic stage IV   Multifactorial.  Has underlying cardiorenal syndrome- suspect acute renal failure to be from  progression of cardiorenal syndrome, diuretic therapy, ACEI, along with obstruction and possible infection.   Appreciate Nephrology consultation.  Per Nephrology:  volume status is okay.  D/C Zaroxolyn, and restart previous dose of torsemide, decrease coreg (allowing BP to run a little high to improve perfusion).  Insert foley as patient is retaining urine.  D/C with Foley in place to be seen by urology.  Chronic diastolic heart failure  Do not see any evidence of decompensation.  Torsemide will be restarted at discharge at previous home doses (80 mg bid).  Add potassium 20 meq daily.  Coreg has been decreased to increase kidney perfusion.  Zaroxolyn has been discontinued.  She has an appointment tomorrow [facility].  Atrial fibrillation  Rate controlled with amiodarone and Coreg. On chronic coumadin.  Chronic right-sided pleural effusion  Prior thoracocentesis- -Was transudative per Light's criteria in 5/14.  Likely from CHF-clinically currently asymptomatic from this.   Diabetes  CBGs stable.  Lantus stopped.  Continue with 5 units TID with meals.  Please monitor and adjust as needed at Nursing facility.  History of gout  Stable, continue allopurinol   Thrombocytopenia  Chronic issue, monitor platelets periodically   COPD  Stable at present   Gastroesophageal reflux disease  PPI   Recurrent Klebsiella UTI  -Carbapenamase producer and hence, multidrug resistant, has been previously treated.  Patient is colonized.  She is  without any fever, or leukocytosis to suggest any active infection.  Please monitor closely for signs for infection (Fever, increased weakness,etc.) as patient is at high risk.  This case was discussed with Infectious Disease (Dr. Ninetta Lights) who advised not to treat with antibiotics.  Consultations: Nephrology:  Dr.  Darrick Penna  Discharge Exam: Filed Vitals:   07/07/13 1340  BP: 144/95  Pulse: 85  Temp: 97.9 F (36.6 C)  Resp: 20    Gen Exam: Awake and alert with clear speech.  Neck: Supple, No JVD.  Chest: B/L Clear. No accessory muscle use. CVS: S1 S2 Regular, no murmurs.  Abdomen: soft, BS +, non tender, non distended.  Extremities: no edema, lower extremities warm to touch. Able to move all 4. Neurologic: Non Focal.    Discharge Instructions      Discharge Orders   Future Appointments Provider Department Dept Phone   07/08/2013 1:40 PM [facility] [phone]   Future Orders Complete By Expires   Diet - low sodium heart healthy  As directed    Diet - low sodium heart healthy  As directed    Increase activity slowly  As directed    Increase activity slowly  As directed        Medication List    STOP taking these medications       HYDROcodone-acetaminophen 7.5-325 MG per tablet  Commonly known as:  NORCO     insulin glargine 100 UNIT/ML injection  Commonly known as:  LANTUS     metolazone 2.5 MG tablet  Commonly known as:  ZAROXOLYN      TAKE these medications       acetaminophen 325 MG tablet  Commonly known as:  TYLENOL  Take 650 mg by mouth every 6 (six) hours as needed for pain.     allopurinol 100 MG tablet  Commonly known as:  ZYLOPRIM  Take 100 mg by mouth daily.     ALPRAZolam 0.25 MG tablet  Commonly known as:  XANAX  Take 0.125 mg by mouth every 8 (eight) hours as needed for anxiety.     amiodarone 200 MG tablet  Commonly known as:  PACERONE  Take 400 mg by mouth daily.     busPIRone 5 MG tablet  Commonly known as:  BUSPAR  Take 5 mg by mouth every 12 (twelve) hours.     carvedilol 12.5 MG tablet  Commonly known as:  COREG  Take 1 tablet (12.5 mg total) by mouth 2 (two) times daily with a meal.     docusate sodium 100 MG capsule  Commonly known as:  COLACE  Take 100 mg by mouth every 8  (eight) hours.     feeding supplement Liqd  Take 237 mLs by mouth 2 (two) times daily between meals.     fluticasone 50 MCG/ACT nasal spray  Commonly known as:  FLONASE  Place 1 spray into the nose daily.     guaiFENesin 600 MG 12 hr tablet  Commonly known as:  MUCINEX  Take 600 mg by mouth 2 (two) times daily.     insulin aspart 100 UNIT/ML injection  Commonly known as:  novoLOG  Inject 5 Units into the skin 3 (three) times daily with meals. If cbg is greater than 150     Melatonin 3 MG Caps  Take 1 capsule by mouth at bedtime.     nitroGLYCERIN 0.4 MG SL tablet  Commonly known as:  NITROSTAT  Place 0.4 mg under the tongue every 5 (five) minutes as needed for chest pain.     nystatin 100000 UNIT/ML suspension  Commonly known as:  MYCOSTATIN  Take 5 mL (500,000 Units total) by mouth 4 (four) times daily.     omeprazole 20 MG capsule  Commonly known as:  PRILOSEC  Take 40 mg by mouth daily.     ondansetron 4 MG tablet  Commonly known as:  ZOFRAN  Take 1 tablet (4 mg total) by mouth every 6 (six) hours as needed for nausea.     oxyCODONE 5 MG immediate release tablet  Commonly known as:  ROXICODONE  Take 1 tablet (5 mg total) by mouth every 6 (six) hours as needed for pain.     polyethylene glycol packet  Commonly known as:  MIRALAX / GLYCOLAX  Take 17 g by mouth 2 (two) times daily.     potassium chloride SA 20 MEQ tablet  Commonly known as:  K-DUR,KLOR-CON  Take 1 tablet (20 mEq total) by mouth daily.     torsemide 20 MG tablet  Commonly known as:  DEMADEX  Take 4 tablets (80 mg total) by mouth 2 (two) times daily.     traZODone 50 MG tablet  Commonly known as:  DESYREL  Take 50 mg by mouth at bedtime.     warfarin 5 MG tablet  Commonly known as:  COUMADIN  Take 2.5 mg by mouth daily.       Allergies  Allergen Reactions  . Morphine Sulfate Other (See Comments)    REACTION: change in personality  . Remeron [Mirtazapine] Other (See Comments)    Altered  mental status, lethargy  . Prescott Gum [Fish Allergy] Other (See Comments)    unknown  . Penicillins Rash    Tolerates Ancef.   Follow-up Information   Follow up with Garnett Farm, MD On 07/26/2013. (1 pm, bring insurance card, phot id , they will mail new patient paperwork to you, please fill out and bring to appt. )    Specialty:  Urology   Contact information:   [address] [place] [address] [phone]       Follow up with [facility] On 07/08/2013. (1:40 pm.)    Specialty:  Cardiology   Contact information:   [id number] [place] [address] [phone]      Follow up with GREEN, Lenon Curt, MD. Schedule an appointment as soon as possible for a visit in 4 days.   Specialty:  Internal Medicine   Contact information:   [address] [place] [address] [phone]       Follow up with DETERDING,JAMES L, MD On 07/07/2013. (The office will call you with an appointment)    Specialty:  Nephrology   Contact information:   [address] [facility] [place] [address] [phone]        The results of significant diagnostics from this hospitalization (including imaging, microbiology, ancillary and laboratory) are listed below for reference.    Significant Diagnostic Studies: Dg Chest 2 View  US Renal  07/06/2013   *RADIOLOGY REPORT*  Clinical Data: Acute kidney injury and renal insufficiency.  RENAL/URINARY TRACT ULTRASOUND COMPLETE  Comparison:  08/30/2012  Findings:  Right Kidney:  11 cm in estimated length.  A 1.2 cm cyst in the upper kidney has a benign appearance.  The renal cortex is mildly echogenic, likely reflecting chronic  kidney disease.  There is no evidence of hydronephrosis.  Left Kidney:  11.9 cm in estimated length.  The renal cortex is mildly echogenic, consistent with chronic disease.  No hydronephrosis is identified.  No focal lesion is seen.  Bladder:  The  bladder is decompressed by a Foley catheter.  IMPRESSION: No evidence of renal obstruction.  Mild increased echogenicity of the renal cortex bilaterally is consistent with chronic disease.   Original Report Authenticated By: Irish Lack, M.D.   Dg Chest Port 1 View  07/05/2013   *RADIOLOGY REPORT*  Clinical Data: Shortness of breath and leg swelling  PORTABLE CHEST - 1 VIEW  Comparison: 06/18/2013 and multiple prior chest radiographs  Findings: Cardiomegaly, right pleural effusion and increasing right lower lung atelectasis noted. The left lung is clear.  There is no evidence of pneumothorax. No acute bony abnormalities are present.  IMPRESSION: Cardiomegaly with right pleural effusion and increasing right lower lung atelectasis.   Original Report Authenticated By: Harmon Pier, M.D.    Microbiology: Recent Results (from the past 240 hour(s))  URINE CULTURE     Status: None   Collection Time    07/05/13  3:42 PM      Result Value Range Status   Specimen Description URINE, CATHETERIZED   Final   Special Requests NONE   Final   Culture  Setup Time     Final   Value: 07/04/2013 15:57     Performed at [facility] Count     Final   Value: >=100,000 COLONIES/ML     Performed at [facility]   Culture     Final   Value: GRAM NEGATIVE RODS     Performed at [facility]   Report Status PENDING   Incomplete     Labs: Basic Metabolic Panel:  Recent Labs Lab 07/05/13 1448 07/06/13 0740 07/07/13 0600  NA 133* 137 146*  K 4.1 3.3* 3.2*  CL 88* 91* 96  CO2 31 35* 39*  GLUCOSE 233* 126* 168*  BUN 99* 93* 83*  CREATININE 3.90* 3.34* 2.59*  CALCIUM 9.5 9.6 9.6  PHOS  --   --  3.7   Liver Function Tests:  Recent Labs Lab 07/06/13 0740 07/07/13 0600  AST 20  --   ALT 11  --   ALKPHOS 163*  --   BILITOT 0.7  --   PROT 6.9  --   ALBUMIN 3.5 3.4*   CBC:  Recent Labs Lab 07/05/13 1448 07/06/13 0740 07/07/13 0600  WBC 5.2 5.2 5.5  HGB 11.5*  11.5* 11.5*  HCT 35.3* 35.6* 35.0*  MCV 86.7 86.6 87.1  PLT 110* 85* 87*   BNP: BNP (last 3 results)  Recent Labs  05/03/13 2223 06/13/13 1714 07/05/13 1448  PROBNP 4188.0* 3655.0* 3388.0*   CBG:  Recent Labs Lab 07/06/13 1143 07/06/13 1711 07/06/13 2109 07/07/13 0802 07/07/13 1333  GLUCAP 172* 199* 213* 157* 270*    Signed:  Conley Canal [phone]  [facility] 07/07/2013, 2:25 PM  Patient interviewed and examined.  Agree with above.  Crista Curb, M.D. [phone]

## 2013-07-07 NOTE — Progress Notes (Signed)
Physical Therapy Treatment Patient Details Name: Gina Ashley MRN: [id number] DOB: 1927/10/04 Today's Date: 07/07/2013 Time: 1191-4782 PT Time Calculation (min): 30 min  PT Assessment / Plan / Recommendation  History of Present Illness Pt admitted with increasing weakness due to AKI on CKD. Recent admission for weakness and PNA and UTI.  Pt with hx of DM and CHF/   PT Comments   Pt is mobilizing much better today, ambulated to bathroom and back with min A with no left knee buckling and no LOB. Daughter educated on proper guarding and safety at home. Pt to d/c home with daughter today with [facility] services. PT signing off.    Follow Up Recommendations  Home health PT;Supervision/Assistance - 24 hour     Does the patient have the potential to tolerate intense rehabilitation     Barriers to Discharge        Equipment Recommendations  None recommended by PT    Recommendations for Other Services    Frequency Min 3X/week   Progress towards PT Goals Progress towards PT goals: Goals met/education completed, patient discharged from PT  Plan Current plan remains appropriate    Precautions / Restrictions Precautions Precautions: Fall Required Braces or Orthoses: Other Brace/Splint Other Brace/Splint: Double upright brace on Lt ankle. Restrictions Weight Bearing Restrictions: No   Pertinent Vitals/Pain VSS    Mobility  Bed Mobility Bed Mobility: Rolling Right;Right Sidelying to Sit;Sitting - Scoot to Edge of Bed Rolling Right: 5: Supervision;With rail Right Sidelying to Sit: 5: Supervision;With rails Sitting - Scoot to Edge of Bed: 5: Supervision Details for Bed Mobility Assistance: pt able to get to the EOB with increased time but no physical assist. Pt has hospital bed at home. Discussed keeping back precautions with rolling as pt has an unstable lumbar disc per dtr Transfers Transfers: Sit to Stand;Stand to Sit Sit to Stand: 4: Min assist;4: Min guard;From bed;From chair/3-in-1;With  upper extremity assist Stand to Sit: 4: Min guard;To chair/3-in-1 Details for Transfer Assistance: min A from bed for safety because pt did not know if knee would buckle. However, she was steady today and only min-guard given for off toilet Ambulation/Gait Ambulation/Gait Assistance: 4: Min assist Ambulation Distance (Feet): 24 Feet (12', 12') Assistive device: Rolling walker Ambulation/Gait Assistance Details: pt ambulated with min A left side for safety with weaker leg and +1 to push O2 tank. No buckling of left knee today or loss of balance, however, last 4', pt reported that legs felt very tired. AFO work on left Gait Pattern: Step-through pattern;Decreased stance time - left;Decreased stride length Gait velocity: decreased Stairs: No Wheelchair Mobility Wheelchair Mobility: No    Exercises     PT Diagnosis:    PT Problem List:   PT Treatment Interventions:     PT Goals (current goals can now be found in the care plan section) Acute Rehab PT Goals Patient Stated Goal: To return home PT Goal Formulation: With patient Time For Goal Achievement: 07/20/13 Potential to Achieve Goals: Good  Visit Information  Last PT Received On: 07/07/13 Assistance Needed: +1 History of Present Illness: Pt admitted with increasing weakness due to AKI on CKD. Recent admission for weakness and PNA and UTI.  Pt with hx of DM and CHF/    Subjective Data  Subjective: pt feeling much stronger today Patient Stated Goal: To return home   Cognition  Cognition Arousal/Alertness: Awake/alert Behavior During Therapy: WFL for tasks assessed/performed Overall Cognitive Status: Within Functional Limits for tasks assessed    Balance  Balance Balance Assessed: Yes Static Standing Balance Static Standing - Balance Support: Bilateral upper extremity supported Static Standing - Level of Assistance: 5: Stand by assistance  End of Session PT - End of Session Equipment Utilized During Treatment: Gait  belt Activity Tolerance: Patient tolerated treatment well Patient left: in chair;with call bell/phone within reach;with family/visitor present Nurse Communication: Mobility status   GP   Gina Ashley, PT  Acute Rehab Services  [phone]   Pecola Leisure, Gina Ashley 07/07/2013, 1:32 PM

## 2013-07-07 NOTE — Progress Notes (Signed)
Occupational Therapy Treatment Patient Details Name: Gina Ashley MRN: [id number] DOB: [birth year] Today's Date: 07/07/2013 Time: 8295-6213 OT Time Calculation (min): 15 min  OT Assessment / Plan / Recommendation  History of present illness Pt admitted with increasing weakness due to AKI on CKD. Recent admission for weakness and PNA and UTI.  Pt with hx of DM and CHF/   OT comments  Pt eating lunch in her bed upon arrival.  Reports daughter having helped her from chair to bed.  Declined OOB activity, but requesting to brush her teeth at EOB.  Plans for d/c home today with assist of daughters.  Follow Up Recommendations  Home health OT;Supervision/Assistance - 24 hour    Barriers to Discharge       Equipment Recommendations  None recommended by OT    Recommendations for Other Services    Frequency Min 2X/week   Progress towards OT Goals    Plan Discharge plan remains appropriate    Precautions / Restrictions Precautions Precautions: Fall Required Braces or Orthoses: Other Brace/Splint Other Brace/Splint: Double upright brace on Lt ankle. Restrictions Weight Bearing Restrictions: No   Pertinent Vitals/Pain Did not report pain, VSS    ADL  Grooming: Wash/dry hands;Min guard Where Assessed - Grooming: Unsupported sitting Upper Body Dressing: Supervision/safety Where Assessed - Upper Body Dressing: Unsupported sitting ADL Comments: pt requested EOB for oral care, declined OOB.    OT Diagnosis:    OT Problem List:   OT Treatment Interventions:     OT Goals(current goals can now be found in the care plan section) Acute Rehab OT Goals Patient Stated Goal: To return home  Visit Information  Last OT Received On: 07/07/13 Assistance Needed: +1 History of Present Illness: Pt admitted with increasing weakness due to AKI on CKD. Recent admission for weakness and PNA and UTI.  Pt with hx of DM and CHF/    Subjective Data      Prior Functioning       Cognition   Cognition Arousal/Alertness: Awake/alert Behavior During Therapy: WFL for tasks assessed/performed Overall Cognitive Status: Within Functional Limits for tasks assessed    Mobility  Bed Mobility Bed Mobility: Rolling Right;Right Sidelying to Sit;Sitting - Scoot to Edge of Bed Rolling Right: 5: Supervision;With rail Right Sidelying to Sit: 5: Supervision;With rails Sitting - Scoot to Edge of Bed: 5: Supervision    Exercises      Balance Balance Balance Assessed: Yes Static Sitting Balance Static Sitting - Balance Support: No upper extremity supported;Feet supported Static Sitting - Level of Assistance: 7: Independent Static Sitting - Comment/# of Minutes: 5    End of Session OT - End of Session Activity Tolerance: Patient tolerated treatment well Patient left: in bed;with call bell/phone within reach  GO     Evern Bio 07/07/2013, 2:23 PM [phone]

## 2013-07-08 ENCOUNTER — Encounter (HOSPITAL_COMMUNITY): Payer: Self-pay

## 2013-07-08 ENCOUNTER — Ambulatory Visit (HOSPITAL_BASED_OUTPATIENT_CLINIC_OR_DEPARTMENT_OTHER)
Admission: RE | Admit: 2013-07-08 | Discharge: 2013-07-08 | Disposition: A | Discharge status: Home / Self Care | Payer: Medicare Other | Source: Ambulatory Visit | Attending: Internal Medicine | Admitting: Internal Medicine

## 2013-07-08 VITALS — BP 128/92 | HR 97 | Wt 198.4 lb

## 2013-07-08 DIAGNOSIS — R339 Retention of urine, unspecified: Secondary | ICD-10-CM | POA: Insufficient documentation

## 2013-07-08 DIAGNOSIS — I482 Chronic atrial fibrillation, unspecified: Secondary | ICD-10-CM

## 2013-07-08 DIAGNOSIS — I5032 Chronic diastolic (congestive) heart failure: Secondary | ICD-10-CM

## 2013-07-08 DIAGNOSIS — N189 Chronic kidney disease, unspecified: Secondary | ICD-10-CM

## 2013-07-08 DIAGNOSIS — I4891 Unspecified atrial fibrillation: Secondary | ICD-10-CM

## 2013-07-08 NOTE — Patient Instructions (Addendum)
Follow up in 1-2 weeks  Do the following things EVERYDAY: 1) Weigh yourself in the morning before breakfast. Write it down and keep it in a log. 2) Take your medicines as prescribed 3) Eat low salt foods-Limit salt (sodium) to 2000 mg per day.  4) Stay as active as you can everyday 5) Limit all fluids for the day to less than 2 liters

## 2013-07-08 NOTE — Progress Notes (Signed)
Patient ID: Gina Ashley, female   DOB: 02/13/1927, 79 y.o.   MRN: [id number]  Nephrology; Dr Darrick Penna PCP: Dr Katrinka Blazing Urology:  Weight Range  217 pounds  Baseline proBNP     HPI: Gina Ashley is an 79 y.o. female with history of chronic atrial fibrillation on coumadin, diastolic heart failure and HTN. She also has DM type 2, COPD, OSA on CPAP, A Fib- chronic coumadin, and CKD failure with baseline Cr 2.2. She has had 5 hospitalizations in the last 6 months, 1 included tibia/fibula fracture, 1 for UTI, and the rest have been due to massive fluid overload in the setting of diastolic heart failure. Per nephrology she is not a candidate for HD.   Admitted to [facility]  03/20/13 with increased dyspnea and lower extremity edema. Diuresed with IV lasix and transitioned to Demadex 60 mg bid. She received R thoracentesis.  Discharge weight 217 pounds. Discharged to [facility] 03/30/13. Palliative Care to follow at SNF.   Admitted to [facility] 07/05/13 through 07/07/13 with generalized weakness. Treated for UTI and she will continue with indwelling catheter due to urinary retention. Evaluated by Renal >decreased carvedilol 12.5 mg twice a day and Metolazone was stopped. Continued on  torsemide 80 mg bid.  Discharge weight 197 pounds.   She returns for follow up. Denies SOB/PND/Orthopnea. Does admit to mild dyspnea with exertion.  Unable to weight due to balance problems.  Wears CPAP at night. HH RN, PT/OT following.  Limits fluids to <1.5 liters a day.   05/03/13 Creatinine 2.5 Potassium 3.9 05/10/13 Creatinine 2.53, K+ 3.9 07/07/13 Creatinine 2.59 K 3.2 ALT 11 AST 20   ROS: All systems negative except as listed in HPI, PMH and Problem List.  Past Medical History  Diagnosis Date  . GERD (gastroesophageal reflux disease)   . Hiatal hernia   . HTN (hypertension)     all her life  . Atrial fibrillation     since 1996  . Depression   . Chronic diastolic heart failure 08/31/2012  . Tibia fracture     BILATERAL  .  Respiratory failure     acute on chronic hx of  requiring BIPAP  . COPD (chronic obstructive pulmonary disease)   . Metabolic encephalopathy     hx of   . UTI (urinary tract infection)     hx of   . Pleural effusion     right sided now resolved   . CHF (congestive heart failure)     diastolic HF  . Complication of anesthesia     "hard to wake up" (03/11/2013)  . OSA on CPAP   . Type II diabetes mellitus 1980's?  . History of blood transfusion 08/2012    "related to leg OR" (03/11/2013)  . Arthritis     "left arm; lower back, hips, hands" (03/11/2013)  . Chronic lower back pain     "disc pops in and out" (03/11/2013)  . Gout   . Anxiety   . Fall 08/2012    "in nursing home" (03/11/2013)  . Anemia   . Chronic a-fib 03/12/2013  . CKD (chronic kidney disease), stage III     GFR: 38  . Renal failure     acute on chronic   . Renal failure     acute on chronic with need for intermittent dialysis - hx of     Current Outpatient Prescriptions  Medication Sig Dispense Refill  . acetaminophen (TYLENOL) 325 MG tablet Take 650 mg by mouth every 6 (  six) hours as needed for pain.      Marland Kitchen allopurinol (ZYLOPRIM) 100 MG tablet Take 100 mg by mouth daily.      Marland Kitchen ALPRAZolam (XANAX) 0.25 MG tablet Take 0.125 mg by mouth every 8 (eight) hours as needed for anxiety.      Marland Kitchen amiodarone (PACERONE) 200 MG tablet Take 400 mg by mouth daily.      . busPIRone (BUSPAR) 5 MG tablet Take 5 mg by mouth every 12 (twelve) hours.       . carvedilol (COREG) 12.5 MG tablet Take 1 tablet (12.5 mg total) by mouth 2 (two) times daily with a meal.  60 tablet  3  . docusate sodium (COLACE) 100 MG capsule Take 100 mg by mouth every 8 (eight) hours.       . fluticasone (FLONASE) 50 MCG/ACT nasal spray Place 1 spray into the nose daily.      Marland Kitchen guaiFENesin (MUCINEX) 600 MG 12 hr tablet Take 600 mg by mouth 2 (two) times daily.       . insulin aspart (NOVOLOG) 100 UNIT/ML injection Inject 5 Units into the skin 3 (three) times daily  with meals. If cbg is greater than 150      . omeprazole (PRILOSEC) 20 MG capsule Take 40 mg by mouth daily.       . ondansetron (ZOFRAN) 4 MG tablet Take 1 tablet (4 mg total) by mouth every 6 (six) hours as needed for nausea.  20 tablet    . oxyCODONE (ROXICODONE) 5 MG immediate release tablet Take 1 tablet (5 mg total) by mouth every 6 (six) hours as needed for pain.  30 tablet  0  . polyethylene glycol (MIRALAX / GLYCOLAX) packet Take 17 g by mouth 2 (two) times daily.       . potassium chloride SA (K-DUR,KLOR-CON) 20 MEQ tablet Take 1 tablet (20 mEq total) by mouth daily.  60 tablet  2  . torsemide (DEMADEX) 20 MG tablet Take 4 tablets (80 mg total) by mouth 2 (two) times daily.  60 tablet  0  . traZODone (DESYREL) 50 MG tablet Take 50 mg by mouth at bedtime.      . feeding supplement (ENSURE COMPLETE) LIQD Take 237 mLs by mouth 2 (two) times daily between meals.  60 Bottle  0  . Melatonin 3 MG CAPS Take 1 capsule by mouth at bedtime.      . nitroGLYCERIN (NITROSTAT) 0.4 MG SL tablet Place 0.4 mg under the tongue every 5 (five) minutes as needed for chest pain.      Marland Kitchen warfarin (COUMADIN) 5 MG tablet Take 2.5 mg by mouth daily. 2.5 daily except 5 mg Tuesday       No current facility-administered medications for this encounter.    PHYSICAL EXAM: Filed Vitals:   07/08/13 1359  BP: 128/92  Pulse: 97  Weight: 198 lb 6.4 oz (89.994 kg)  SpO2: 84%  Weight :  198  General:  Chronically ill. Elderly sitting in wheel chair with her daughter HEENT: normal Neck: supple. JVP 8-9. Carotids 2+ bilaterally; no bruits. No lymphadenopathy or thryomegaly appreciated. Cor: PMI normal. Regular rate & rhythm. No rubs, gallops or murmurs. Lungs: RML RLL diminished on 2 liters [place]. Abdomen: obese, soft, nontender, nondistended. No hepatosplenomegaly. No bruits or masses. Good bowel sounds. Extremities: no cyanosis, clubbing, rash,  R and LLE 1+ edema ted hose. LLE brace edema Neuro: alert & orientedx3,  cranial nerves grossly intact. Moves all 4 extremities w/o difficulty.  Affect pleasant. GU: Foley  ASSESSMENT & PLAN:  1) Chronic diastolic HF, EF 55-60%, mild MR, mod TR  - NYHA III. Volume status stable- Continue torsemide 80 mg bid. Hold off Metolazone and only add if her weight starts to trend up.  - Re-instructed patient and family to call if weight greater than 205 on home scale - Followed by [facility] - Reinforced the need and importance of daily weights, a low sodium diet, and fluid restriction (less than 2 L a day).   2) HTN -- continue carvedilol 12.5 mg twice a day  3) Chronic A-Fib - Rate controlled, will continue Amiodarone and Coumadin - Can try to cut amiodarone back next visit - LFTs ok,  Will need yearly eye exam while she is on amiodarone.   4) Urinary Retention UTI /indwelling foley Follow up with Urology 07/26/13   5) CKD Follow up with Dr Darrick Penna 07/26/13 . Not a candidate for HD   Follow up in 1 week to reassess volume status  Amit Leece NP-C 2:12 PM

## 2013-07-12 ENCOUNTER — Telehealth (HOSPITAL_COMMUNITY): Payer: Self-pay | Admitting: Cardiology

## 2013-07-12 LAB — URINE CULTURE: Colony Count: >=100000

## 2013-07-12 MED ORDER — METOLAZONE 2.5 MG PO TABS: 2.5000 mg | ORAL_TABLET | ORAL | Status: DC

## 2013-07-12 NOTE — Telephone Encounter (Signed)
Discussed w/Amy Filbert Schilder, NP will give pt Metolazone 2.5 mg today and then every Mon, [name] is aware and agreeable, rx sent in, she will let us know if wt is not coming down

## 2013-07-12 NOTE — Telephone Encounter (Signed)
Sister called with conerns of weight gain Weight last week 198 today weight 204 Recent med changes Mild SOB, LE Edema Bilaterally, moderate abdominal swelling and denies cough    Please advise

## 2013-07-14 ENCOUNTER — Encounter (HOSPITAL_COMMUNITY): Payer: Self-pay

## 2013-07-14 ENCOUNTER — Ambulatory Visit (HOSPITAL_COMMUNITY)
Admission: RE | Admit: 2013-07-14 | Discharge: 2013-07-14 | Disposition: A | Discharge status: Home / Self Care | Payer: Medicare Other | Source: Ambulatory Visit | Attending: Internal Medicine | Admitting: Internal Medicine

## 2013-07-14 VITALS — BP 114/78 | HR 81 | Wt 207.8 lb

## 2013-07-14 DIAGNOSIS — I10 Essential (primary) hypertension: Secondary | ICD-10-CM

## 2013-07-14 DIAGNOSIS — I482 Chronic atrial fibrillation, unspecified: Secondary | ICD-10-CM

## 2013-07-14 DIAGNOSIS — I4891 Unspecified atrial fibrillation: Secondary | ICD-10-CM

## 2013-07-14 DIAGNOSIS — I5032 Chronic diastolic (congestive) heart failure: Secondary | ICD-10-CM | POA: Insufficient documentation

## 2013-07-14 LAB — BASIC METABOLIC PANEL
Calcium: 9.5 mg/dL (ref 8.4–10.5)
GFR calc non Af Amer: 13 mL/min — ABNORMAL LOW (ref >90)
Glucose, Bld: 218 mg/dL — ABNORMAL HIGH (ref 70–99)
Potassium: 4 mEq/L (ref 3.5–5.1)
Sodium: 130 mEq/L — ABNORMAL LOW (ref 135–145)

## 2013-07-14 NOTE — Progress Notes (Signed)
Patient ID: Gina Ashley, female   DOB: 06-30-1927, 79 y.o.   MRN: [id number]  Nephrology; Dr Darrick Penna PCP: Dr Katrinka Blazing Urology:  Weight Range  217 pounds  Baseline proBNP     HPI: Gina Ashley is an 79 y.o. female with history of chronic atrial fibrillation on coumadin, diastolic heart failure and HTN. She also has DM type 2, COPD, OSA on CPAP, A Fib- chronic coumadin, and CKD failure with baseline Cr 2.2. She has had 5 hospitalizations in the last 6 months, 1 included tibia/fibula fracture, 1 for UTI, and the rest have been due to massive fluid overload in the setting of diastolic heart failure. Per nephrology she is not a candidate for HD.   Admitted to [facility]  03/20/13 with increased dyspnea and lower extremity edema. Diuresed with IV lasix and transitioned to Demadex 60 mg bid. She received R thoracentesis.  Discharge weight 217 pounds. Discharged to [facility] 03/30/13. Palliative Care to follow at SNF.   Admitted to [facility] 07/05/13 through 07/07/13 with generalized weakness. Treated for UTI and she will continue with indwelling catheter due to urinary retention. Evaluated by Renal >decreased carvedilol 12.5 mg twice a day and Metolazone was stopped. Continued on  torsemide 80 mg bid.  Discharge weight 197 pounds.   Follow up: Since last visit now taking metolaozne 2.5 mg daily. Denies SOB/PND/Orthopnea or CP. + DOE and fatigue. Weighing daily 200-204 lbs.  Wears CPAP at night. HH RN, PT/OT following.  Limits fluids to <1.5 liters a day.   05/03/13 Creatinine 2.5 Potassium 3.9 05/10/13 Creatinine 2.53, K+ 3.9 07/07/13 Creatinine 2.59 K 3.2 ALT 11 AST 20  SH: Lives with daughter in [place].  ROS: All systems negative except as listed in HPI, PMH and Problem List.  Past Medical History  Diagnosis Date  . GERD (gastroesophageal reflux disease)   . Hiatal hernia   . HTN (hypertension)     all her life  . Atrial fibrillation     since 1996  . Depression   . Chronic diastolic heart failure  08/31/2012  . Tibia fracture     BILATERAL  . Respiratory failure     acute on chronic hx of  requiring BIPAP  . COPD (chronic obstructive pulmonary disease)   . Metabolic encephalopathy     hx of   . UTI (urinary tract infection)     hx of   . Pleural effusion     right sided now resolved   . CHF (congestive heart failure)     diastolic HF  . Complication of anesthesia     "hard to wake up" (03/11/2013)  . OSA on CPAP   . Type II diabetes mellitus 1980's?  . History of blood transfusion 08/2012    "related to leg OR" (03/11/2013)  . Arthritis     "left arm; lower back, hips, hands" (03/11/2013)  . Chronic lower back pain     "disc pops in and out" (03/11/2013)  . Gout   . Anxiety   . Fall 08/2012    "in nursing home" (03/11/2013)  . Anemia   . Chronic a-fib 03/12/2013  . CKD (chronic kidney disease), stage III     GFR: 38  . Renal failure     acute on chronic   . Renal failure     acute on chronic with need for intermittent dialysis - hx of     Current Outpatient Prescriptions  Medication Sig Dispense Refill  . acetaminophen (TYLENOL) 325 MG tablet  Take 650 mg by mouth every 6 (six) hours as needed for pain.      Marland Kitchen allopurinol (ZYLOPRIM) 100 MG tablet Take 100 mg by mouth daily.      Marland Kitchen ALPRAZolam (XANAX) 0.25 MG tablet Take 0.125 mg by mouth every 8 (eight) hours as needed for anxiety.      Marland Kitchen amiodarone (PACERONE) 200 MG tablet Take 400 mg by mouth daily.      . busPIRone (BUSPAR) 5 MG tablet Take 5 mg by mouth every 12 (twelve) hours.       . carvedilol (COREG) 12.5 MG tablet Take 1 tablet (12.5 mg total) by mouth 2 (two) times daily with a meal.  60 tablet  3  . docusate sodium (COLACE) 100 MG capsule Take 100 mg by mouth every 8 (eight) hours.       . feeding supplement (ENSURE COMPLETE) LIQD Take 237 mLs by mouth 2 (two) times daily between meals.  60 Bottle  0  . fluticasone (FLONASE) 50 MCG/ACT nasal spray Place 1 spray into the nose daily.      Marland Kitchen guaiFENesin (MUCINEX) 600  MG 12 hr tablet Take 600 mg by mouth 2 (two) times daily.       . insulin aspart (NOVOLOG) 100 UNIT/ML injection Inject 5 Units into the skin 3 (three) times daily with meals. If cbg is greater than 150      . Melatonin 3 MG CAPS Take 1 capsule by mouth at bedtime.      . metolazone (ZAROXOLYN) 2.5 MG tablet Take 1 tablet (2.5 mg total) by mouth once a week. On Mondays  90 tablet  3  . nitroGLYCERIN (NITROSTAT) 0.4 MG SL tablet Place 0.4 mg under the tongue every 5 (five) minutes as needed for chest pain.      Marland Kitchen omeprazole (PRILOSEC) 20 MG capsule Take 40 mg by mouth daily.       . ondansetron (ZOFRAN) 4 MG tablet Take 1 tablet (4 mg total) by mouth every 6 (six) hours as needed for nausea.  20 tablet    . oxyCODONE (ROXICODONE) 5 MG immediate release tablet Take 1 tablet (5 mg total) by mouth every 6 (six) hours as needed for pain.  30 tablet  0  . polyethylene glycol (MIRALAX / GLYCOLAX) packet Take 17 g by mouth 2 (two) times daily.       . potassium chloride SA (K-DUR,KLOR-CON) 20 MEQ tablet Take 1 tablet (20 mEq total) by mouth daily.  60 tablet  2  . torsemide (DEMADEX) 20 MG tablet Take 4 tablets (80 mg total) by mouth 2 (two) times daily.  60 tablet  0  . traZODone (DESYREL) 50 MG tablet Take 50 mg by mouth at bedtime.      Marland Kitchen warfarin (COUMADIN) 5 MG tablet Take 2.5 mg by mouth daily. 2.5 daily except 5 mg Tuesday       No current facility-administered medications for this encounter.    PHYSICAL EXAM: Filed Vitals:   07/14/13 1508  BP: 114/78  Pulse: 81  Weight: 207 lb 12.8 oz (94.257 kg)  SpO2: 88%  Last weight: 198 lbs   General:  Chronically ill. Elderly sitting in wheel chair with her daughter HEENT: normal Neck: supple. JVP 8-9. Carotids 2+ bilaterally; no bruits. No lymphadenopathy or thryomegaly appreciated. Cor: PMI normal. Regular rate & rhythm. No rubs, gallops or murmurs. Lungs: RML RLL diminished on 2 liters [place]. Abdomen: obese, soft, nontender, nondistended. No  hepatosplenomegaly. No bruits or masses.  Good bowel sounds. Extremities: no cyanosis, clubbing, rash,  R and LLE 1+ edema ted hose. LLE brace edema Neuro: alert & orientedx3, cranial nerves grossly intact. Moves all 4 extremities w/o difficulty. Affect pleasant. GU: Foley  ASSESSMENT & PLAN:  1) Chronic diastolic HF, EF 55-60%, mild MR, mod TR  - NYHA III. Volume status elevated. Up 10 lbs since discharge. Will have patient take extra 40 mg torsemide today and tomorrow along with an extra 20 meq K+. Will get labs next week with [facility]. She is to call on Friday with weight. If remains elevated will likely add metolazone on Fridays as well. - Re-instructed patient and family to call if weight greater than 205 on home scale. - Reinforced the need and importance of daily weights, a low sodium diet, and fluid restriction (less than 2 L a day). Instructed to call the HF clinic if weight increases more than 3 lbs overnight or 5 lbs in a week.   2) HTN -- continue carvedilol 12.5 mg twice a day  3) Chronic A-Fib - Rate controlled, will continue BB and Coumadin - Will cut amiodarone back to 200 mg daily - LFTs ok. Discussed need for yearly eye exam while she is on amiodarone.   4) Urinary Retention UTI /indwelling foley Follow up with Urology 07/26/13   5) CKD Follow up with Dr Darrick Penna 07/26/13 . Not a candidate for HD   Follow up in 2 week to reassess volume status  Ulla Potash B NP-C 3:46 PM

## 2013-07-14 NOTE — Patient Instructions (Addendum)
Take an extra 40 mg torsemide today and tomorrow along with an extra 20 meq potassium today and tomorrow.  Call with lab results.  Call us Friday with weight, may need to increase metolazone to M and Friday.  Do the following things EVERYDAY: 1) Weigh yourself in the morning before breakfast. Write it down and keep it in a log. 2) Take your medicines as prescribed 3) Eat low salt foods-Limit salt (sodium) to 2000 mg per day.  4) Stay as active as you can everyday 5) Limit all fluids for the day to less than 2 liters 6)

## 2013-07-16 ENCOUNTER — Telehealth (HOSPITAL_COMMUNITY): Payer: Self-pay | Admitting: Anesthesiology

## 2013-07-16 MED ORDER — AMIODARONE HCL 200 MG PO TABS: 200.0000 mg | ORAL_TABLET | Freq: Every day | ORAL | Status: DC

## 2013-07-16 MED ORDER — METOLAZONE 2.5 MG PO TABS: ORAL_TABLET | ORAL | Status: DC

## 2013-07-16 NOTE — Telephone Encounter (Signed)
Called today to give update on weight. Weight is down a few pounds, but hard to tell because patient weighs on 2 different scales. Instructed to take an extra 40 mg torsemide today, take 40 meq potassium today and start metolazone 2.5 mg every Monday and now Friday. Instructed to weigh on one scale only. Also will cut amiodarone back to 200 mg daily. Patient aware.

## 2013-07-19 DIAGNOSIS — I509 Heart failure, unspecified: Secondary | ICD-10-CM

## 2013-07-19 DIAGNOSIS — J449 Chronic obstructive pulmonary disease, unspecified: Secondary | ICD-10-CM

## 2013-07-19 DIAGNOSIS — E119 Type 2 diabetes mellitus without complications: Secondary | ICD-10-CM

## 2013-07-27 ENCOUNTER — Ambulatory Visit (HOSPITAL_COMMUNITY)
Admission: RE | Admit: 2013-07-27 | Discharge: 2013-07-27 | Disposition: A | Discharge status: Home / Self Care | Payer: Medicare Other | Source: Ambulatory Visit | Attending: Internal Medicine | Admitting: Internal Medicine

## 2013-07-27 VITALS — BP 122/76 | HR 63 | Wt 192.0 lb

## 2013-07-27 DIAGNOSIS — I10 Essential (primary) hypertension: Secondary | ICD-10-CM | POA: Insufficient documentation

## 2013-07-27 DIAGNOSIS — I5032 Chronic diastolic (congestive) heart failure: Secondary | ICD-10-CM | POA: Insufficient documentation

## 2013-07-27 DIAGNOSIS — I4891 Unspecified atrial fibrillation: Secondary | ICD-10-CM | POA: Insufficient documentation

## 2013-07-27 DIAGNOSIS — I482 Chronic atrial fibrillation, unspecified: Secondary | ICD-10-CM

## 2013-07-27 DIAGNOSIS — N189 Chronic kidney disease, unspecified: Secondary | ICD-10-CM | POA: Insufficient documentation

## 2013-07-27 NOTE — Progress Notes (Signed)
Patient ID: Gina Ashley, female   DOB: Sep 02, 1927, 79 y.o.   MRN: [id number]  Nephrology; Dr Darrick Penna PCP: Dr Katrinka Blazing Urology:  Weight Range  217 pounds  Baseline proBNP     HPI: Gina Ashley is an 79 y.o. female with history of chronic atrial fibrillation on coumadin, diastolic heart failure and HTN. She also has DM type 2, COPD, OSA on CPAP, A Fib- chronic coumadin, and CKD failure with baseline Cr 2.2. She has had 5 hospitalizations in the last 6 months, 1 included tibia/fibula fracture, 1 for UTI, and the rest have been due to massive fluid overload in the setting of diastolic heart failure. Per nephrology she is not a candidate for HD.   Admitted to [facility]  03/20/13 with increased dyspnea and lower extremity edema. Diuresed with IV lasix and transitioned to Demadex 60 mg bid. She received R thoracentesis.  Discharge weight 217 pounds. Discharged to [facility] 03/30/13. Palliative Care to follow at SNF.   Admitted to [facility] 07/05/13 through 07/07/13 with generalized weakness. Treated for UTI and she will continue with indwelling catheter due to urinary retention. Evaluated by Renal >decreased carvedilol 12.5 mg twice a day and Metolazone was stopped. Continued on  torsemide 80 mg bid.  Discharge weight 197 pounds.   Follow up: Since last visit called patient about weight and was instructed to take metolazone 2.5 mg Mon and Friday, however there was some confusion and have only been taking on Mondays. Cut Amiodarone back to 200 mg daily. She reports feeling better. Denies SOB/PND/Orthopnea or CP. Foley removed yesterday per urology. Has more energy and denies DOE. Weighing daily 185-190 lbs. SBP 114-151. Wears CPAP at night for about 4-5 hours. Reports now able to walk short distances with walker and can wash dishes.  HH RN, PT/OT following.  Limits fluids to <1.5 liters a day.   05/03/13 Creatinine 2.5 Potassium 3.9 05/10/13 Creatinine 2.53, K+ 3.9 07/07/13 Creatinine 2.59 K 3.2 ALT 11 AST 20 07/14/13  Creatinine 3.02, K+ 4.0, Na 130  SH: Lives with daughter in [place].  ROS: All systems negative except as listed in HPI, PMH and Problem List.  Past Medical History  Diagnosis Date  . GERD (gastroesophageal reflux disease)   . Hiatal hernia   . HTN (hypertension)     all her life  . Atrial fibrillation     since 1996  . Depression   . Chronic diastolic heart failure 08/31/2012  . Tibia fracture     BILATERAL  . Respiratory failure     acute on chronic hx of  requiring BIPAP  . COPD (chronic obstructive pulmonary disease)   . Metabolic encephalopathy     hx of   . UTI (urinary tract infection)     hx of   . Pleural effusion     right sided now resolved   . CHF (congestive heart failure)     diastolic HF  . Complication of anesthesia     "hard to wake up" (03/11/2013)  . OSA on CPAP   . Type II diabetes mellitus 1980's?  . History of blood transfusion 08/2012    "related to leg OR" (03/11/2013)  . Arthritis     "left arm; lower back, hips, hands" (03/11/2013)  . Chronic lower back pain     "disc pops in and out" (03/11/2013)  . Gout   . Anxiety   . Fall 08/2012    "in nursing home" (03/11/2013)  . Anemia   . Chronic a-fib  03/12/2013  . CKD (chronic kidney disease), stage III     GFR: 38  . Renal failure     acute on chronic   . Renal failure     acute on chronic with need for intermittent dialysis - hx of     Current Outpatient Prescriptions  Medication Sig Dispense Refill  . acetaminophen (TYLENOL) 325 MG tablet Take 650 mg by mouth every 6 (six) hours as needed for pain.      Marland Kitchen allopurinol (ZYLOPRIM) 100 MG tablet Take 100 mg by mouth daily.      Marland Kitchen ALPRAZolam (XANAX) 0.25 MG tablet Take 0.125 mg by mouth every 8 (eight) hours as needed for anxiety.      Marland Kitchen amiodarone (PACERONE) 200 MG tablet Take 1 tablet (200 mg total) by mouth daily.  30 tablet  3  . busPIRone (BUSPAR) 5 MG tablet Take 5 mg by mouth every 12 (twelve) hours.       . carvedilol (COREG) 12.5 MG tablet  Take 1 tablet (12.5 mg total) by mouth 2 (two) times daily with a meal.  60 tablet  3  . docusate sodium (COLACE) 100 MG capsule Take 100 mg by mouth every 8 (eight) hours.       . feeding supplement (ENSURE COMPLETE) LIQD Take 237 mLs by mouth 2 (two) times daily between meals.  60 Bottle  0  . fluticasone (FLONASE) 50 MCG/ACT nasal spray Place 1 spray into the nose daily.      Marland Kitchen guaiFENesin (MUCINEX) 600 MG 12 hr tablet Take 600 mg by mouth 2 (two) times daily.       . insulin aspart (NOVOLOG) 100 UNIT/ML injection Inject 5 Units into the skin 3 (three) times daily with meals. If cbg is greater than 150      . Melatonin 3 MG CAPS Take 1 capsule by mouth at bedtime.      . metolazone (ZAROXOLYN) 2.5 MG tablet On Mondays, can take on Fri if needed      . nitroGLYCERIN (NITROSTAT) 0.4 MG SL tablet Place 0.4 mg under the tongue every 5 (five) minutes as needed for chest pain.      Marland Kitchen omeprazole (PRILOSEC) 20 MG capsule Take 40 mg by mouth daily.       . ondansetron (ZOFRAN) 4 MG tablet Take 1 tablet (4 mg total) by mouth every 6 (six) hours as needed for nausea.  20 tablet    . oxyCODONE (ROXICODONE) 5 MG immediate release tablet Take 1 tablet (5 mg total) by mouth every 6 (six) hours as needed for pain.  30 tablet  0  . polyethylene glycol (MIRALAX / GLYCOLAX) packet Take 17 g by mouth 2 (two) times daily.       . potassium chloride SA (K-DUR,KLOR-CON) 20 MEQ tablet Take 1 tablet (20 mEq total) by mouth daily.  60 tablet  2  . torsemide (DEMADEX) 20 MG tablet Take 4 tablets (80 mg total) by mouth 2 (two) times daily.  60 tablet  0  . traZODone (DESYREL) 50 MG tablet Take 50 mg by mouth at bedtime.      Marland Kitchen warfarin (COUMADIN) 5 MG tablet Take 2.5 mg by mouth daily. 2.5 daily except 5 mg Tuesday       No current facility-administered medications for this encounter.    PHYSICAL EXAM: Filed Vitals:   07/27/13 1458  BP: 122/76  Pulse: 63  Weight: 192 lb (87.091 kg)  SpO2: 94%  Last weight:  207  General:  Chronically ill. Elderly sitting in wheel chair with her daughter HEENT: normal Neck: supple. JVP 6-7. Carotids 2+ bilaterally; no bruits. No lymphadenopathy or thryomegaly appreciated. Cor: PMI normal. Regular rate & rhythm. No rubs, gallops or murmurs. Lungs: diminished in bases, 2 liters [place]. Abdomen: obese, soft, nontender, nondistended. No hepatosplenomegaly. No bruits or masses. Good bowel sounds. Extremities: no cyanosis, clubbing, rash,  R and LLE trace; edema ted hose. LLE brace edema Neuro: alert & orientedx3, cranial nerves grossly intact. Moves all 4 extremities w/o difficulty. Affect pleasant. GU: Foley  ASSESSMENT & PLAN:  1) Chronic diastolic HF, EF 55-60%, mild MR, mod TR  - NYHA III. Volume status much improved and is down 15 lbs since last visit. She feels much better and is now walking short distances with her walker. Will continue torsemide 80 mg BID and metolazone 2.5 mg on Mondays. - Reinforced the need and importance of daily weights, a low sodium diet, and fluid restriction (less than 2 L a day). Instructed to call the HF clinic if weight increases more than 3 lbs overnight or 5 lbs in a week.  - Will get BMET tomorrow with [facility]  2) HTN -- continue carvedilol 12.5 mg twice a day - SBP reported from home 114-150, do not want to lower BP too much and cause dizziness will continue to follow  3) Chronic A-Fib - Rate controlled, will continue BB and Coumadin - Amiodarone decreased last visit to 200 mg daily and tolerating. - LFTs ok and TSH stable (06/2013). Discussed need for yearly eye exam while she is on amiodarone.   4) Urinary Retention UTI /indwelling foley - Saw urology yesterday and they discontinued foley catheter, continue to follow up  5) CKD, stage IV - Not a candidate for HD  - BMET tomorrow  Follow up in 1 month  Gina Ashley B NP-C 4:59 PM

## 2013-07-27 NOTE — Patient Instructions (Addendum)
Doing great keep up the good work!!!!  Please call if your weight starts trending up or you notice any increase in SOB.  Follow up 1 month.  Will get labs with [facility] and call with results.  Do the following things EVERYDAY: 1) Weigh yourself in the morning before breakfast. Write it down and keep it in a log. 2) Take your medicines as prescribed 3) Eat low salt foods-Limit salt (sodium) to 2000 mg per day.  4) Stay as active as you can everyday 5) Limit all fluids for the day to less than 2 liters 6)

## 2013-07-28 ENCOUNTER — Encounter: Payer: Self-pay | Admitting: Internal Medicine

## 2013-07-28 ENCOUNTER — Telehealth: Payer: Self-pay | Admitting: *Deleted

## 2013-07-28 ENCOUNTER — Ambulatory Visit (INDEPENDENT_AMBULATORY_CARE_PROVIDER_SITE_OTHER): Payer: Medicare Other | Admitting: Cardiovascular Disease

## 2013-07-28 DIAGNOSIS — Z7901 Long term (current) use of anticoagulants: Secondary | ICD-10-CM

## 2013-07-28 LAB — POCT INR: INR: 2.5

## 2013-07-28 NOTE — Telephone Encounter (Signed)
Home health nurse calling in her INR 2.5. No change Please call nurse needs an order for her lab.

## 2013-07-29 NOTE — Telephone Encounter (Signed)
Spoke with [facility], RN gave verbal order for redraw and dosage instructions.  See anticoagulation note in Epic.

## 2013-08-03 ENCOUNTER — Telehealth (HOSPITAL_COMMUNITY): Payer: Self-pay | Admitting: Cardiology

## 2013-08-03 NOTE — Telephone Encounter (Signed)
Gina Ashley called with concerns of 3 lb weight gain overnight. Weight 191 Pt states she feels her belly is full b/p 100/60    Per vo Tonye Becket, NP Take extra torsemide today  Christy and pt aware

## 2013-08-18 ENCOUNTER — Ambulatory Visit (INDEPENDENT_AMBULATORY_CARE_PROVIDER_SITE_OTHER): Payer: Medicare Other | Admitting: Internal Medicine

## 2013-08-18 ENCOUNTER — Ambulatory Visit (INDEPENDENT_AMBULATORY_CARE_PROVIDER_SITE_OTHER)
Admission: RE | Admit: 2013-08-18 | Discharge: 2013-08-18 | Disposition: A | Discharge status: Home / Self Care | Payer: Medicare Other | Source: Ambulatory Visit | Attending: Internal Medicine | Admitting: Internal Medicine

## 2013-08-18 ENCOUNTER — Encounter: Payer: Self-pay | Admitting: Internal Medicine

## 2013-08-18 VITALS — BP 104/58 | HR 75 | Temp 98.0°F | Ht 63.0 in | Wt 181.0 lb

## 2013-08-18 DIAGNOSIS — J90 Pleural effusion, not elsewhere classified: Secondary | ICD-10-CM

## 2013-08-18 DIAGNOSIS — J9610 Chronic respiratory failure, unspecified whether with hypoxia or hypercapnia: Secondary | ICD-10-CM

## 2013-08-18 DIAGNOSIS — R0602 Shortness of breath: Secondary | ICD-10-CM

## 2013-08-18 NOTE — Assessment & Plan Note (Signed)
Adequate control on present rx, reviewed > no change in rx needed  = 2lpm 24/7 to help with diuresis

## 2013-08-18 NOTE — Patient Instructions (Addendum)
Continue to wear the 02 24/7 at 2lpm - this will help the kidneys  Continue the omeprazole 30-60 min before first meal of the day  - add pepcid 20 mg at bedtime anytime having night time or early am  cough   GERD (REFLUX)  is an extremely common cause of respiratory symptoms, many times with no significant heartburn at all.    It can be treated with medication, but also with lifestyle changes including avoidance of late meals, excessive alcohol, smoking cessation, and avoid fatty foods, chocolate, peppermint, colas, red wine, and acidic juices such as orange juice.  NO MINT OR MENTHOL PRODUCTS SO NO COUGH DROPS  USE SUGARLESS CANDY INSTEAD (jolley ranchers or Stover's)  NO OIL BASED VITAMINS - use powdered substitutes.  Please remember to go to the x-ray department downstairs for your tests - we will call you with the results when they are available.     Pulmonary follow up is as needed

## 2013-08-18 NOTE — Progress Notes (Signed)
Subjective:    Patient ID: Gina Ashley, female    DOB: 13-Mar-1927   MRN: [id number]  HPI  9 yobf never smoker with good ex tol until age 79 with onset of back problems and wt gain referred by Dr Katrinka Blazing with breathing problems since around 2012 to point of 02 dep referred 08/18/2013 to pulmonary clinic with ? copd   08/18/2013 1st [facility] office visit/ Wert cc breathing waxes and wanes x 2 years depending on amount of fluid retention no real change on albuterol and able to lie down @ no lower than 30 degrees but does wake up some am's with thick mucus, congested cough p stirring (does not wake her up)  Renal signed off x one month and rec Hospice.  No obvious other patterns in day to day or daytime variabilty or assoc chronic cough or cp or chest tightness, subjective wheeze overt sinus or hb symptoms. No unusual exp hx or h/o childhood pna/ asthma or knowledge of premature birth.  Sleeping ok at 30 degreees without nocturnal  or early am exacerbation  of respiratory  c/o's or need for noct saba. Also denies any obvious fluctuation of symptoms with weather or environmental changes or other aggravating or alleviating factors except as outlined above   Current Medications, Allergies, Complete Past Medical History, Past Surgical History, Family History, and Social History were reviewed in [employer] record.     Review of Systems  Constitutional: Negative for fever, chills, diaphoresis, activity change, appetite change, fatigue and unexpected weight change.  HENT: Positive for rhinorrhea and sneezing. Negative for congestion, dental problem, ear discharge, ear pain, facial swelling, hearing loss, mouth sores, nosebleeds, postnasal drip, sinus pressure, sore throat, tinnitus, trouble swallowing and voice change.   Eyes: Negative for photophobia, discharge, itching and visual disturbance.  Respiratory: Positive for cough and shortness of breath. Negative for apnea,  choking, chest tightness, wheezing and stridor.   Cardiovascular: Negative for chest pain, palpitations and leg swelling.  Gastrointestinal: Negative for nausea, vomiting, abdominal pain, constipation, blood in stool and abdominal distention.  Genitourinary: Negative for dysuria, urgency, frequency, hematuria, flank pain, decreased urine volume and difficulty urinating.  Musculoskeletal: Negative for arthralgias, back pain, gait problem, joint swelling, myalgias, neck pain and neck stiffness.  Skin: Negative for color change, pallor and rash.  Neurological: Negative for dizziness, tremors, seizures, syncope, speech difficulty, weakness, light-headedness, numbness and headaches.  Hematological: Negative for adenopathy. Does not bruise/bleed easily.  Psychiatric/Behavioral: Negative for confusion, sleep disturbance and agitation. The patient is not nervous/anxious.        Objective:   Physical Exam Frail elderly bf in w/c on 02 can't get out s help Wt Readings from Last 3 Encounters:  08/18/13 181 lb (82.101 kg)  07/27/13 192 lb (87.091 kg)  07/14/13 207 lb 12.8 oz (94.257 kg)    HEENT: nl dentition, turbinates, and orophanx. Nl external ear canals without cough reflex   NECK :  without JVD/Nodes/TM/ nl carotid upstrokes bilaterally   LUNGS: no acc muscle use, clear to A and P bilaterally without cough on insp or exp maneuvers   CV:  RRR  no s3 or murmur or increase in P2, 2 plus pitting sym bilateral leg edema  ABD:  soft and nontender with nl excursion in the supine position. No bruits or organomegaly, bowel sounds nl  MS:  warm without deformities, calf tenderness, cyanosis or clubbing. L leg brace  SKIN: warm and dry without lesions    NEURO:  alert, approp, no deficits      CXR  08/18/2013 :  1. Near complete resolution of the small right-sided pleural effusion. 2. The appearance of the chest suggests mild congestive heart failure, as above. 3. Atherosclerosis       Assessment & Plan:

## 2013-08-18 NOTE — Assessment & Plan Note (Signed)
Spirometry nl 08/18/2013 x for truncated/ nonphysiologic f/v loop   Symptoms are markedly disproportionate to objective findings and not clear this is a lung problem but pt does appear to have difficult airway management issues. DDX of  difficult airways managment all start with A and  include Adherence, Ace Inhibitors, Acid Reflux, Active Sinus Disease, Alpha 1 Antitripsin deficiency, Anxiety masquerading as Airways dz,  ABPA,  allergy(esp in young), Aspiration (esp in elderly), Adverse effects of DPI,  Active smokers, plus two Bs  = Bronchiectasis and Beta blocker use..and one C= CHF  Most likely this is CHF/ vol overload related to CRI with improvement correlating with resolution of pleural effusion which coincidentally has the exact same effect on insp muscles as does emphysema/copd which Gina Ashley does not have.  Therefore rec continue on 02, no inhalers/ keep as dry as kidney's will tol  Pulmonary f/u prn

## 2013-08-18 NOTE — Progress Notes (Signed)
Quick Note:  Spoke with pt and notified of results per Dr. Wert. Pt verbalized understanding and denied any questions.  ______ 

## 2013-08-20 ENCOUNTER — Ambulatory Visit (INDEPENDENT_AMBULATORY_CARE_PROVIDER_SITE_OTHER): Payer: Medicare Other | Admitting: Pharmacist

## 2013-08-20 DIAGNOSIS — Z7901 Long term (current) use of anticoagulants: Secondary | ICD-10-CM

## 2013-08-31 ENCOUNTER — Ambulatory Visit (HOSPITAL_COMMUNITY)
Admission: RE | Admit: 2013-08-31 | Discharge: 2013-08-31 | Disposition: A | Discharge status: Home / Self Care | Payer: Medicare Other | Source: Ambulatory Visit | Attending: Internal Medicine | Admitting: Internal Medicine

## 2013-08-31 VITALS — BP 94/58 | HR 77 | Wt 182.8 lb

## 2013-08-31 DIAGNOSIS — N189 Chronic kidney disease, unspecified: Secondary | ICD-10-CM

## 2013-08-31 DIAGNOSIS — I5032 Chronic diastolic (congestive) heart failure: Secondary | ICD-10-CM

## 2013-08-31 DIAGNOSIS — G4733 Obstructive sleep apnea (adult) (pediatric): Secondary | ICD-10-CM

## 2013-08-31 DIAGNOSIS — R339 Retention of urine, unspecified: Secondary | ICD-10-CM

## 2013-08-31 NOTE — Patient Instructions (Signed)
Follow up in 6 weeks  Instructed to cut torsemide back to 60 mg twice a day  if her weight is < 180 pounds.    Do the following things EVERYDAY: 1) Weigh yourself in the morning before breakfast. Write it down and keep it in a log. 2) Take your medicines as prescribed 3) Eat low salt foods-Limit salt (sodium) to 2000 mg per day.  4) Stay as active as you can everyday 5) Limit all fluids for the day to less than 2 liters

## 2013-08-31 NOTE — Progress Notes (Signed)
Patient ID: Gina Ashley, female   DOB: 06-09-1927, 79 y.o.   MRN: [id number]  Nephrology; Dr Deterding PCP: Dr Katrinka Blazing Urology:  Pulmonology: Dr Sherene Sires  INR managed [place] with results faxed to [facility].  Weight Range  217 pounds  Baseline proBNP     HPI: [name] is an 79 y.o. female with history of chronic atrial fibrillation on coumadin, diastolic heart failure and HTN. She also has DM type 2, COPD, OSA on CPAP, A Fib- chronic coumadin, and CKD failure with baseline Cr 2.2. She has had 5 hospitalizations in the last 6 months, 1 included tibia/fibula fracture, 1 for UTI, and the rest have been due to massive fluid overload in the setting of diastolic heart failure. Per nephrology she is not a candidate for HD.   Admitted to [facility]  03/20/13 with increased dyspnea and lower extremity edema. Diuresed with IV lasix and transitioned to Demadex 60 mg bid. She received R thoracentesis.  Discharge weight 217 pounds. Discharged to [facility] 03/30/13. Palliative Care to follow at SNF.   Admitted to [facility] 07/05/13 through 07/07/13 with generalized weakness. Treated for UTI and she will continue with indwelling catheter due to urinary retention. Evaluated by Renal >decreased carvedilol 12.5 mg twice a day and Metolazone was stopped. Continued on  torsemide 80 mg bid.  Discharge weight 197 pounds.   She returns for follow up. Overall she is feeling much better. She is able to perform ADls with minimal assistance. Denies SOB/PND/Orthopnea.Uses oxygen every now and then. Weight at home 178-182 pounds. Uses CPAP at night. She will start outpatient PT this week. Ambulates with walker. Completed HH. Compliant with medications. Following low salt diet and limiting fluid intake to < 2 liters per day.   05/03/13 Creatinine 2.5 Potassium 3.9 05/10/13 Creatinine 2.53, K+ 3.9 07/07/13 Creatinine 2.59 K 3.2 ALT 11 AST 20 07/14/13 Creatinine 3.02, K+ 4.0, Na 130 07/28/13 Creatinine 2.8 K 4.8 NA 136   SH:  Lives with daughter in [place].  ROS: All systems negative except as listed in HPI, PMH and Problem List.  Past Medical History  Diagnosis Date  . GERD (gastroesophageal reflux disease)   . Hiatal hernia   . HTN (hypertension)     all her life  . Atrial fibrillation     since 1996  . Depression   . Chronic diastolic heart failure 08/31/2012  . Tibia fracture     BILATERAL  . Respiratory failure     acute on chronic hx of  requiring BIPAP  . COPD (chronic obstructive pulmonary disease)   . Metabolic encephalopathy     hx of   . UTI (urinary tract infection)     hx of   . Pleural effusion     right sided now resolved   . CHF (congestive heart failure)     diastolic HF  . Complication of anesthesia     "hard to wake up" (03/11/2013)  . OSA on CPAP   . Type II diabetes mellitus 1980's?  . History of blood transfusion 08/2012    "related to leg OR" (03/11/2013)  . Arthritis     "left arm; lower back, hips, hands" (03/11/2013)  . Chronic lower back pain     "disc pops in and out" (03/11/2013)  . Gout   . Anxiety   . Fall 08/2012    "in nursing home" (03/11/2013)  . Anemia   . Chronic a-fib 03/12/2013  . CKD (chronic kidney disease), stage III  GFR: 38  . Renal failure     acute on chronic   . Renal failure     acute on chronic with need for intermittent dialysis - hx of     Current Outpatient Prescriptions  Medication Sig Dispense Refill  . acetaminophen (TYLENOL) 325 MG tablet Take 650 mg by mouth every 6 (six) hours as needed for pain.      Marland Kitchen allopurinol (ZYLOPRIM) 100 MG tablet Take 100 mg by mouth daily.      Marland Kitchen ALPRAZolam (XANAX) 0.25 MG tablet Take 0.125 mg by mouth every 8 (eight) hours as needed for anxiety.      Marland Kitchen amiodarone (PACERONE) 200 MG tablet Take 1 tablet (200 mg total) by mouth daily.  30 tablet  3  . busPIRone (BUSPAR) 5 MG tablet Take 5 mg by mouth every 12 (twelve) hours.       . carvedilol (COREG) 12.5 MG tablet Take 1 tablet (12.5 mg total) by mouth  2 (two) times daily with a meal.  60 tablet  3  . docusate sodium (COLACE) 100 MG capsule Take 100 mg by mouth every 8 (eight) hours.       . fluticasone (FLONASE) 50 MCG/ACT nasal spray Place 1 spray into the nose daily.      Marland Kitchen guaiFENesin (MUCINEX) 600 MG 12 hr tablet Take 600 mg by mouth daily.       . insulin aspart (NOVOLOG) 100 UNIT/ML injection Inject 5 Units into the skin 3 (three) times daily with meals. If cbg is greater than 150      . Melatonin 3 MG CAPS Take 1 capsule by mouth at bedtime.      . metolazone (ZAROXOLYN) 2.5 MG tablet On Mondays, can take on Fri if needed      . nitroGLYCERIN (NITROSTAT) 0.4 MG SL tablet Place 0.4 mg under the tongue every 5 (five) minutes as needed for chest pain.      Marland Kitchen omeprazole (PRILOSEC) 20 MG capsule Take 40 mg by mouth daily.       . ondansetron (ZOFRAN) 4 MG tablet Take 1 tablet (4 mg total) by mouth every 6 (six) hours as needed for nausea.  20 tablet    . oxyCODONE (ROXICODONE) 5 MG immediate release tablet Take 1 tablet (5 mg total) by mouth every 6 (six) hours as needed for pain.  30 tablet  0  . polyethylene glycol (MIRALAX / GLYCOLAX) packet Take 17 g by mouth 2 (two) times daily.       . potassium chloride SA (K-DUR,KLOR-CON) 20 MEQ tablet Take 1 tablet (20 mEq total) by mouth daily.  60 tablet  2  . torsemide (DEMADEX) 20 MG tablet Take 4 tablets (80 mg total) by mouth 2 (two) times daily.  60 tablet  0  . traZODone (DESYREL) 50 MG tablet Take 50 mg by mouth at bedtime.      Marland Kitchen warfarin (COUMADIN) 5 MG tablet Take 2.5 mg by mouth daily. 2.5 daily except 5 mg Tuesday       No current facility-administered medications for this encounter.    PHYSICAL EXAM: Filed Vitals:   08/31/13 1335  BP: 94/58  Pulse: 77  Weight: 182 lb 12 oz (82.895 kg)  SpO2: 94%   General:  Chronically ill. Elderly sitting in wheel chair with her daughter HEENT: normal Neck: supple. JVP 6-7. Carotids 2+ bilaterally; no bruits. No lymphadenopathy or  thryomegaly appreciated. Cor: PMI normal. Regular rate & Gina. No rubs, gallops or murmurs.  Lungs: diminished in bases, 2 liters [place]. Abdomen: obese, soft, nontender, nondistended. No hepatosplenomegaly. No bruits or masses. Good bowel sounds. Extremities: no cyanosis, clubbing, rash,  R and LLE no edema ted hose on bilaterally. LLE brace edema Neuro: alert & orientedx3, cranial nerves grossly intact. Moves all 4 extremities w/o difficulty. Affect pleasant. GU: Foley  ASSESSMENT & PLAN:  1) Chronic diastolic HF, EF 55-60%, mild MR, mod TR  - NYHA III. Functionally she continues to improve. Able to perform ADLs.  Volume status stable. Weight at home trending down. Will continue torsemide 80 mg BID and metolazone 2.5 mg on Mondays. Instructed to cut torsemide back to 60 mg bid if her weight is < 180 pounds.  - Reinforced the need and importance of daily weights, a low sodium diet, and fluid restriction (less than 2 L a day). Instructed to call the HF clinic if weight increases more than 3 lbs overnight or 5 lbs in a week.   2) HTN- Soft but BP ok at home.  -- continue carvedilol 12.5 mg twice a day. SBP at home 110-130  -  3) Chronic A-Fib - Rate controlled, will continue BB and Coumadin - Continue Amiodarone  200 mg daily.  - LFTs ok and TSH stable (06/2013). Discussed need for yearly eye exam while she is on amiodarone.   4) Urinary Retention- resolved. F/U as needed with urology.   5) CKD, stage IV - Not a candidate for HD    Follow up in 6 weeks   CLEGG,AMY NP-C 1:48 PM

## 2013-09-01 ENCOUNTER — Ambulatory Visit (INDEPENDENT_AMBULATORY_CARE_PROVIDER_SITE_OTHER): Payer: Medicare Other | Admitting: Cardiovascular Disease

## 2013-09-01 DIAGNOSIS — Z7901 Long term (current) use of anticoagulants: Secondary | ICD-10-CM

## 2013-09-01 LAB — POCT INR: INR: 4.1

## 2013-09-02 ENCOUNTER — Encounter: Payer: Self-pay | Admitting: Family Medicine

## 2013-09-04 ENCOUNTER — Encounter: Payer: Self-pay | Admitting: Family Medicine

## 2013-09-14 ENCOUNTER — Ambulatory Visit (INDEPENDENT_AMBULATORY_CARE_PROVIDER_SITE_OTHER): Payer: Medicare Other | Admitting: Internal Medicine

## 2013-09-14 DIAGNOSIS — Z7901 Long term (current) use of anticoagulants: Secondary | ICD-10-CM

## 2013-09-14 LAB — POCT INR: INR: 2.7

## 2013-09-17 DIAGNOSIS — I5032 Chronic diastolic (congestive) heart failure: Secondary | ICD-10-CM

## 2013-09-17 DIAGNOSIS — I509 Heart failure, unspecified: Secondary | ICD-10-CM

## 2013-09-17 DIAGNOSIS — E119 Type 2 diabetes mellitus without complications: Secondary | ICD-10-CM

## 2013-09-17 DIAGNOSIS — J449 Chronic obstructive pulmonary disease, unspecified: Secondary | ICD-10-CM

## 2013-09-20 ENCOUNTER — Telehealth (HOSPITAL_COMMUNITY): Payer: Self-pay | Admitting: Cardiology

## 2013-09-20 NOTE — Telephone Encounter (Signed)
Called to report 6 lb increase overnight (186) Denies SOB, swelling or cough Pt will take an extra dose of diuretic for a day or two  Called pt and she is doing well today. Weight is back down and feels much better

## 2013-09-21 ENCOUNTER — Telehealth (HOSPITAL_COMMUNITY): Payer: Self-pay | Admitting: *Deleted

## 2013-09-21 DIAGNOSIS — I5022 Chronic systolic (congestive) heart failure: Secondary | ICD-10-CM

## 2013-09-21 NOTE — Telephone Encounter (Signed)
Pt's daughter called concerned about pt, she states she gave her metolazone on Fri because wt was up to 186, pt should be around 180, and yesterday wt was down to 175 today is 176, she states pt just feels tired and drained, advised was probable just from the metolazone and losing so much wt over weekend, will send order to [facility] for bmet today

## 2013-09-22 ENCOUNTER — Ambulatory Visit (HOSPITAL_COMMUNITY)
Admission: RE | Admit: 2013-09-22 | Discharge: 2013-09-22 | Disposition: A | Discharge status: Home / Self Care | Payer: Medicare Other | Source: Ambulatory Visit | Attending: Internal Medicine | Admitting: Internal Medicine

## 2013-09-22 VITALS — BP 122/58 | HR 76 | Wt 186.5 lb

## 2013-09-22 DIAGNOSIS — I482 Chronic atrial fibrillation, unspecified: Secondary | ICD-10-CM

## 2013-09-22 DIAGNOSIS — I5032 Chronic diastolic (congestive) heart failure: Secondary | ICD-10-CM

## 2013-09-22 DIAGNOSIS — N189 Chronic kidney disease, unspecified: Secondary | ICD-10-CM

## 2013-09-22 DIAGNOSIS — N184 Chronic kidney disease, stage 4 (severe): Secondary | ICD-10-CM | POA: Insufficient documentation

## 2013-09-22 DIAGNOSIS — I129 Hypertensive chronic kidney disease with stage 1 through stage 4 chronic kidney disease, or unspecified chronic kidney disease: Secondary | ICD-10-CM | POA: Insufficient documentation

## 2013-09-22 DIAGNOSIS — I10 Essential (primary) hypertension: Secondary | ICD-10-CM

## 2013-09-22 DIAGNOSIS — I4891 Unspecified atrial fibrillation: Secondary | ICD-10-CM

## 2013-09-22 MED ORDER — TORSEMIDE 20 MG PO TABS: ORAL_TABLET | ORAL | Status: DC

## 2013-09-22 NOTE — Progress Notes (Signed)
Patient ID: Gina Ashley, female   DOB: 1927-02-26, 79 y.o.   MRN: [id number]  Nephrology; Dr Deterding PCP: Dr Katrinka Blazing Urology:  Pulmonology: Dr Sherene Sires  INR managed [place] with results faxed to [facility].  Weight Range  217 pounds  Baseline proBNP     HPI: Gina Ashley is an 79 y.o. female with history of chronic atrial fibrillation on coumadin, diastolic heart failure and HTN. She also has DM type 2, COPD, OSA on CPAP, A Fib- chronic coumadin, and CKD failure with baseline Cr 2.2. She has had 5 hospitalizations in the last 6 months, 1 included tibia/fibula fracture, 1 for UTI, and the rest have been due to massive fluid overload in the setting of diastolic heart failure. Per nephrology she is not a candidate for HD.   Admitted to [facility]  03/20/13 with increased dyspnea and lower extremity edema. Diuresed with IV lasix and transitioned to Demadex 60 mg bid. She received R thoracentesis.  Discharge weight 217 pounds. Discharged to [facility] 03/30/13. Palliative Care to follow at SNF.   Admitted to [facility] 07/05/13 through 07/07/13 with generalized weakness. Treated for UTI and she will continue with indwelling catheter due to urinary retention. Evaluated by Renal >decreased carvedilol 12.5 mg twice a day and Metolazone was stopped. Continued on  torsemide 80 mg bid.  Discharge weight 197 pounds.   Follow up: Weight went up last week to 186 and she took metolazone Friday. On Sunday weight 175 lbs. Overall fatigued. Denies SOB, orthopnea or CP. Weight at home 175-186 lbs. Uses CPAP nightly. HH RN and PT following. Compliant medications. Following low salt diet and limiting fluid intake to < 2 liters per day.   05/03/13 Creatinine 2.5 Potassium 3.9 05/10/13 Creatinine 2.53, K+ 3.9 07/07/13 Creatinine 2.59 K 3.2 ALT 11 AST 20 07/14/13 Creatinine 3.02, K+ 4.0, Na 130 07/28/13 Creatinine 2.8 K 4.8 NA 136   SH: Lives with daughter in [place].  ROS: All systems negative except as listed in HPI,  PMH and Problem List.  Past Medical History  Diagnosis Date  . GERD (gastroesophageal reflux disease)   . Hiatal hernia   . HTN (hypertension)     all her life  . Atrial fibrillation     since 1996  . Depression   . Chronic diastolic heart failure 08/31/2012  . Tibia fracture     BILATERAL  . Respiratory failure     acute on chronic hx of  requiring BIPAP  . COPD (chronic obstructive pulmonary disease)   . Metabolic encephalopathy     hx of   . UTI (urinary tract infection)     hx of   . Pleural effusion     right sided now resolved   . CHF (congestive heart failure)     diastolic HF  . Complication of anesthesia     "hard to wake up" (03/11/2013)  . OSA on CPAP   . Type II diabetes mellitus 1980's?  . History of blood transfusion 08/2012    "related to leg OR" (03/11/2013)  . Arthritis     "left arm; lower back, hips, hands" (03/11/2013)  . Chronic lower back pain     "disc pops in and out" (03/11/2013)  . Gout   . Anxiety   . Fall 08/2012    "in nursing home" (03/11/2013)  . Anemia   . Chronic a-fib 03/12/2013  . CKD (chronic kidney disease), stage III     GFR: 38  . Renal failure  acute on chronic   . Renal failure     acute on chronic with need for intermittent dialysis - hx of     Current Outpatient Prescriptions  Medication Sig Dispense Refill  . acetaminophen (TYLENOL) 325 MG tablet Take 650 mg by mouth every 6 (six) hours as needed for pain.      Marland Kitchen allopurinol (ZYLOPRIM) 100 MG tablet Take 100 mg by mouth daily.      Marland Kitchen ALPRAZolam (XANAX) 0.25 MG tablet Take 0.125 mg by mouth every 8 (eight) hours as needed for anxiety.      Marland Kitchen amiodarone (PACERONE) 200 MG tablet Take 1 tablet (200 mg total) by mouth daily.  30 tablet  3  . busPIRone (BUSPAR) 5 MG tablet Take 5 mg by mouth every 12 (twelve) hours.       . carvedilol (COREG) 12.5 MG tablet Take 1 tablet (12.5 mg total) by mouth 2 (two) times daily with a meal.  60 tablet  3  . docusate sodium (COLACE) 100 MG  capsule Take 100 mg by mouth daily.       . fluticasone (FLONASE) 50 MCG/ACT nasal spray Place 1 spray into the nose daily.      Marland Kitchen guaiFENesin (MUCINEX) 600 MG 12 hr tablet Take 600 mg by mouth daily.       . insulin aspart (NOVOLOG) 100 UNIT/ML injection Inject 5 Units into the skin 3 (three) times daily with meals. If cbg is greater than 150      . metolazone (ZAROXOLYN) 2.5 MG tablet On Mondays, can take on Fri if needed      . nitroGLYCERIN (NITROSTAT) 0.4 MG SL tablet Place 0.4 mg under the tongue every 5 (five) minutes as needed for chest pain.      Marland Kitchen omeprazole (PRILOSEC) 20 MG capsule Take 40 mg by mouth daily.       . ondansetron (ZOFRAN) 4 MG tablet Take 1 tablet (4 mg total) by mouth every 6 (six) hours as needed for nausea.  20 tablet    . oxyCODONE (ROXICODONE) 5 MG immediate release tablet Take 1 tablet (5 mg total) by mouth every 6 (six) hours as needed for pain.  30 tablet  0  . polyethylene glycol (MIRALAX / GLYCOLAX) packet Take 17 g by mouth 2 (two) times daily.       . potassium chloride SA (K-DUR,KLOR-CON) 20 MEQ tablet Take 1 tablet (20 mEq total) by mouth daily.  60 tablet  2  . torsemide (DEMADEX) 20 MG tablet If weight is 180 or less take 60 mg Twice daily, if weight is over 180 take 80 mg Twice daily  240 tablet  3  . traZODone (DESYREL) 50 MG tablet Take 50 mg by mouth at bedtime.      Marland Kitchen warfarin (COUMADIN) 5 MG tablet Take 2.5 mg by mouth daily. 2.5 daily except 5 mg Tuesday       No current facility-administered medications for this encounter.    Filed Vitals:   09/22/13 1430  BP: 122/58  Pulse: 76  Weight: 186 lb 8 oz (84.596 kg)  SpO2: 98%   PHYSICAL EXAM: General:  Chronically ill. Elderly sitting in wheel chair with her daughter HEENT: normal Neck: supple. JVP 6-7. Carotids 2+ bilaterally; no bruits. No lymphadenopathy or thryomegaly appreciated. Cor: PMI normal. Regular rate & rhythm. No rubs, gallops or murmurs. Lungs: diminished in bases, 2 liters  [place]. Abdomen: obese, soft, nontender, nondistended. No hepatosplenomegaly. No bruits or masses. Good bowel sounds.  Extremities: no cyanosis, clubbing, rash,  R and LLE no edema ted hose on bilaterally. LLE brace edema Neuro: alert & orientedx3, cranial nerves grossly intact. Moves all 4 extremities w/o difficulty. Affect pleasant. GU: Foley  ASSESSMENT & PLAN:  1) Chronic diastolic HF, EF 55-60% (01/2013), mild MR, mod TR  - NYHA III symptoms. She was doing well until last week when she ate some pork rinds and her weight increased. She took metolazone and extra torsemide and then started feeling fatigued. Her fluid status is at baseline and believe she just was feeling so fatigued because she became dry, weight dropped to 175 lbs. HHRN drew labs today and will fax to [place]. Will continue torsemide 60 mg BID if weight is 180 lbs or less and to increase to 80 mg BID if weight is greater than 180 lbs. She has 2.5 mg metolazone ordered for Mondays and inforced to family to hold if weight less than 180 lbs. - Continue BB at current dose. Not on ACE-I or Spiro d/t CRI.  - Reinforced the need and importance of daily weights, a low sodium diet, and fluid restriction (less than 2 L a day). Instructed to call the HF clinic if weight increases more than 3 lbs overnight or 5 lbs in a week.  2) HTN - continue carvedilol 12.5 mg twice a day. Stable 3) Chronic A-Fib - Rate controlled, will continue BB and Amiodarone. - Continue Coumadin.  - LFTs ok and TSH stable (06/2013). Discussed need for yearly eye exam while she is on amiodarone. 4) CKD, stage IV - Not a candidate for HD    Follow up 6-8 weeks  Ulla Potash B NP-C 5:03 PM

## 2013-09-22 NOTE — Patient Instructions (Signed)
Continue torsemide 60 mg twice a day if weight is 180 lbs or less. If weight is greater than 180 lbs then take 80 mg twice day of your torsemide.  Will call about lab results.  Call any issues.  Have a wonderful Thanksgiving and Christmas.   Follow up beginning of January.  Do the following things EVERYDAY: 1) Weigh yourself in the morning before breakfast. Write it down and keep it in a log. 2) Take your medicines as prescribed 3) Eat low salt foods-Limit salt (sodium) to 2000 mg per day.  4) Stay as active as you can everyday 5) Limit all fluids for the day to less than 2 liters 6)

## 2013-09-27 ENCOUNTER — Ambulatory Visit (INDEPENDENT_AMBULATORY_CARE_PROVIDER_SITE_OTHER): Payer: Medicare Other | Admitting: Pharmacist

## 2013-09-27 DIAGNOSIS — Z7901 Long term (current) use of anticoagulants: Secondary | ICD-10-CM

## 2013-09-27 LAB — POCT INR: INR: 2.3

## 2013-10-04 ENCOUNTER — Encounter: Payer: Self-pay | Admitting: Family Medicine

## 2013-10-08 ENCOUNTER — Ambulatory Visit (INDEPENDENT_AMBULATORY_CARE_PROVIDER_SITE_OTHER): Payer: Medicare Other | Admitting: Internal Medicine

## 2013-10-08 DIAGNOSIS — Z7901 Long term (current) use of anticoagulants: Secondary | ICD-10-CM

## 2013-10-11 ENCOUNTER — Encounter (HOSPITAL_COMMUNITY): Payer: Medicare Other

## 2013-10-11 ENCOUNTER — Ambulatory Visit (HOSPITAL_COMMUNITY)
Admission: RE | Admit: 2013-10-11 | Discharge: 2013-10-11 | Disposition: A | Discharge status: Home / Self Care | Payer: Medicare Other | Source: Ambulatory Visit | Attending: Internal Medicine | Admitting: Internal Medicine

## 2013-10-11 ENCOUNTER — Other Ambulatory Visit: Payer: Self-pay | Admitting: Pharmacist

## 2013-10-11 VITALS — BP 132/66 | HR 86 | Wt 190.2 lb

## 2013-10-11 DIAGNOSIS — N189 Chronic kidney disease, unspecified: Secondary | ICD-10-CM

## 2013-10-11 DIAGNOSIS — G4733 Obstructive sleep apnea (adult) (pediatric): Secondary | ICD-10-CM

## 2013-10-11 DIAGNOSIS — I5032 Chronic diastolic (congestive) heart failure: Secondary | ICD-10-CM

## 2013-10-11 MED ORDER — WARFARIN SODIUM 5 MG PO TABS: 2.5000 mg | ORAL_TABLET | Freq: Every day | ORAL | Status: DC

## 2013-10-11 NOTE — Patient Instructions (Signed)
Follow up in 2 months  Do the following things EVERYDAY: 1) Weigh yourself in the morning before breakfast. Write it down and keep it in a log. 2) Take your medicines as prescribed 3) Eat low salt foods-Limit salt (sodium) to 2000 mg per day.  4) Stay as active as you can everyday 5) Limit all fluids for the day to less than 2 liters 

## 2013-10-11 NOTE — Progress Notes (Signed)
Patient ID: Gina Ashley, female   DOB: 1926-11-26, 78 y.o.   MRN: [id number] Nephrology; Dr Deterding PCP: Dr Katrinka Blazing Urology:  Pulmonology: Dr Sherene Sires  INR managed [place] with results faxed to [facility].  Weight Range  217 pounds  Baseline proBNP     HPI: Gina Ashley is an 79 y.o. female with history of chronic atrial fibrillation on coumadin, diastolic heart failure and HTN. She also has DM type 2, COPD, OSA on CPAP, A Fib- chronic coumadin, and CKD failure with baseline Cr 2.2. She has had 5 hospitalizations in the last 6 months, 1 included tibia/fibula fracture, 1 for UTI, and the rest have been due to massive fluid overload in the setting of diastolic heart failure. Per nephrology she is not a candidate for HD.   Admitted to [facility]  03/20/13 with increased dyspnea and lower extremity edema. Diuresed with IV lasix and transitioned to Demadex 60 mg bid. She received R thoracentesis.  Discharge weight 217 pounds. Discharged to [facility] 03/30/13. Palliative Care to follow at SNF.   Admitted to [facility] 07/05/13 through 07/07/13 with generalized weakness. Treated for UTI and she will continue with indwelling catheter due to urinary retention. Evaluated by Renal >decreased carvedilol 12.5 mg twice a day and Metolazone was stopped. Continued on  torsemide 80 mg bid.  Discharge weight 197 pounds.   She returns for follow up. Last visit her volume was low and her daughter stopped metolazone about 3-4 weeks ago. She did increase torsemide to 80 mg twice a day. Weight at home has been 182 lbs and trending up to 191 pounds. Denies SOB/PND/Orthopena/CP. Not able to use CPAP all night. She can tolerate CPAP for about 2 hours . [facility] RN following. Able to perform ADLs. Compliant medications. Following low salt diet and limiting fluid intake to < 2 liters per day. Appetite improved.   05/03/13 Creatinine 2.5 Potassium 3.9 05/10/13 Creatinine 2.53, K+ 3.9 07/07/13 Creatinine 2.59 K 3.2 ALT 11 AST 20 07/14/13  Creatinine 3.02, K+ 4.0, Na 130 07/28/13 Creatinine 2.8 K 4.8 NA 136   SH: Lives with daughter in [place].  ROS: All systems negative except as listed in HPI, PMH and Problem List.  Past Medical History  Diagnosis Date  . GERD (gastroesophageal reflux disease)   . Hiatal hernia   . HTN (hypertension)     all her life  . Atrial fibrillation     since 1996  . Depression   . Chronic diastolic heart failure 08/31/2012  . Tibia fracture     BILATERAL  . Respiratory failure     acute on chronic hx of  requiring BIPAP  . COPD (chronic obstructive pulmonary disease)   . Metabolic encephalopathy     hx of   . UTI (urinary tract infection)     hx of   . Pleural effusion     right sided now resolved   . CHF (congestive heart failure)     diastolic HF  . Complication of anesthesia     "hard to wake up" (03/11/2013)  . OSA on CPAP   . Type II diabetes mellitus 1980's?  . History of blood transfusion 08/2012    "related to leg OR" (03/11/2013)  . Arthritis     "left arm; lower back, hips, hands" (03/11/2013)  . Chronic lower back pain     "disc pops in and out" (03/11/2013)  . Gout   . Anxiety   . Fall 08/2012    "in nursing  home" (03/11/2013)  . Anemia   . Chronic a-fib 03/12/2013  . CKD (chronic kidney disease), stage III     GFR: 38  . Renal failure     acute on chronic   . Renal failure     acute on chronic with need for intermittent dialysis - hx of     Current Outpatient Prescriptions  Medication Sig Dispense Refill  . acetaminophen (TYLENOL) 325 MG tablet Take 650 mg by mouth every 6 (six) hours as needed for pain.      Marland Kitchen allopurinol (ZYLOPRIM) 100 MG tablet Take 100 mg by mouth daily.      Marland Kitchen ALPRAZolam (XANAX) 0.25 MG tablet Take 0.125 mg by mouth every 8 (eight) hours as needed for anxiety.      Marland Kitchen amiodarone (PACERONE) 200 MG tablet Take 1 tablet (200 mg total) by mouth daily.  30 tablet  3  . busPIRone (BUSPAR) 5 MG tablet Take 5 mg by mouth every 12 (twelve) hours.        . carvedilol (COREG) 12.5 MG tablet Take 1 tablet (12.5 mg total) by mouth 2 (two) times daily with a meal.  60 tablet  3  . docusate sodium (COLACE) 100 MG capsule Take 100 mg by mouth daily.       . fluticasone (FLONASE) 50 MCG/ACT nasal spray Place 1 spray into the nose daily.      Marland Kitchen guaiFENesin (MUCINEX) 600 MG 12 hr tablet Take 600 mg by mouth daily.       . insulin aspart (NOVOLOG) 100 UNIT/ML injection Inject 5 Units into the skin 3 (three) times daily with meals. If cbg is greater than 150      . metolazone (ZAROXOLYN) 2.5 MG tablet On Mondays, can take on Fri if needed      . nitroGLYCERIN (NITROSTAT) 0.4 MG SL tablet Place 0.4 mg under the tongue every 5 (five) minutes as needed for chest pain.      Marland Kitchen omeprazole (PRILOSEC) 20 MG capsule Take 40 mg by mouth daily.       . ondansetron (ZOFRAN) 4 MG tablet Take 1 tablet (4 mg total) by mouth every 6 (six) hours as needed for nausea.  20 tablet    . oxyCODONE (ROXICODONE) 5 MG immediate release tablet Take 1 tablet (5 mg total) by mouth every 6 (six) hours as needed for pain.  30 tablet  0  . polyethylene glycol (MIRALAX / GLYCOLAX) packet Take 17 g by mouth 2 (two) times daily.       . potassium chloride SA (K-DUR,KLOR-CON) 20 MEQ tablet Take 1 tablet (20 mEq total) by mouth daily.  60 tablet  2  . torsemide (DEMADEX) 20 MG tablet If weight is 180 or less take 60 mg Twice daily, if weight is over 180 take 80 mg Twice daily  240 tablet  3  . traZODone (DESYREL) 50 MG tablet Take 50 mg by mouth at bedtime.      Marland Kitchen warfarin (COUMADIN) 5 MG tablet Take 2.5 mg by mouth daily. 2.5 daily except 5 mg Tuesday       No current facility-administered medications for this encounter.    Filed Vitals:   10/11/13 1332  BP: 132/66  Pulse: 86  Weight: 190 lb 4 oz (86.297 kg)  SpO2: 93%   PHYSICAL EXAM: General:  Chronically ill. Ambulated into the clinic with rolling walker. Elderly. NAD.  HEENT: normal Neck: supple. JVP to jaw.  Carotids 2+  bilaterally; no bruits. No  lymphadenopathy or thryomegaly appreciated. Cor: PMI normal. Regular rate & rhythm. No rubs, gallops or murmurs. Lungs: diminished in bases, 2 liters [place]. Abdomen: obese, soft, nontender,  ++ distended. No hepatosplenomegaly. No bruits or masses. Good bowel sounds. Extremities: no cyanosis, clubbing, rash,  R and LLE no edema ted hose on bilaterally. LLE brace edema Neuro: alert & orientedx3, cranial nerves grossly intact. Moves all 4 extremities w/o difficulty. Affect pleasant.   ASSESSMENT & PLAN:  1) Chronic diastolic HF, EF 55-60% (01/2013), mild MR, mod TR  - NYHA III symptoms. Overall all she is doing ok but her weight is trending up about 10 pounds but her daughter stopped metolazone about 3 weeks ago. Despite weight gain she has had a functional improvement. Able to perform ADLs independently.  Volume status elevated. Instructed to take 2.5 mg metolazone today and restart weekly as long as her weight is 180 pounds or greater. Continue torsemide 60 mg BID if weight is 180 lbs or less and to increase to 80 mg BID if weight is greater than 180 lbs.  - Continue BB at current dose. Not on ACE-I or Spiro d/t CRI.  - Reinforced the need and importance of daily weights, a low sodium diet, and to limit fluids to < 2 liters per day.  2) HTN - continue carvedilol 12.5 mg twice a day. Stable 3) Chronic A-Fib - Rate controlled, will continue BB and Amiodarone. Continue Coumadin.  - LFTs ok and TSH stable (06/2013). Discussed need for yearly eye exam while she is on amiodarone. 4) CKD, stage IV - Not a candidate for HD    Follow up 2 months to reassess volume status. [facility] to check BMET this week.   CLEGG,AMY NP-C 1:58 PM

## 2013-10-18 ENCOUNTER — Telehealth (HOSPITAL_COMMUNITY): Payer: Self-pay | Admitting: Cardiology

## 2013-10-18 NOTE — Telephone Encounter (Signed)
[facility] nurse called to report pts b/p 86/72 asymptomatic daughter held torsemide and bp meds this am. [facility] not scheduled to go back until 12/26, can do a prn visit if needed

## 2013-10-18 NOTE — Telephone Encounter (Signed)
Spoke w/pt's daughter Eunice Blase she states pt wt was up a little yesterday and she took Torsemide 80 mg BID, today wt down 4 lbs denies dizziness/lightheadness, Debbie held torsemide and metolazone, advised that was ok resume Torsemide tomorrow, let us know if pt becomes symptomatic.  Also left mess for Christy to please do prn visit at the end of the week

## 2013-10-22 ENCOUNTER — Inpatient Hospital Stay (HOSPITAL_COMMUNITY)
Admission: EM | Admit: 2013-10-22 | Discharge: 2013-10-25 | DRG: 292 | Disposition: A | Discharge status: Home Health | Payer: Medicare Other | Attending: Internal Medicine | Admitting: Internal Medicine

## 2013-10-22 ENCOUNTER — Emergency Department (HOSPITAL_COMMUNITY): Payer: Medicare Other

## 2013-10-22 ENCOUNTER — Telehealth (HOSPITAL_COMMUNITY): Payer: Self-pay | Admitting: Cardiology

## 2013-10-22 ENCOUNTER — Encounter (HOSPITAL_COMMUNITY): Payer: Self-pay | Admitting: Emergency Medicine

## 2013-10-22 DIAGNOSIS — I4891 Unspecified atrial fibrillation: Secondary | ICD-10-CM

## 2013-10-22 DIAGNOSIS — M545 Low back pain, unspecified: Secondary | ICD-10-CM | POA: Diagnosis present

## 2013-10-22 DIAGNOSIS — N189 Chronic kidney disease, unspecified: Secondary | ICD-10-CM

## 2013-10-22 DIAGNOSIS — F411 Generalized anxiety disorder: Secondary | ICD-10-CM

## 2013-10-22 DIAGNOSIS — M109 Gout, unspecified: Secondary | ICD-10-CM | POA: Diagnosis present

## 2013-10-22 DIAGNOSIS — J449 Chronic obstructive pulmonary disease, unspecified: Secondary | ICD-10-CM | POA: Diagnosis present

## 2013-10-22 DIAGNOSIS — I5033 Acute on chronic diastolic (congestive) heart failure: Principal | ICD-10-CM

## 2013-10-22 DIAGNOSIS — E669 Obesity, unspecified: Secondary | ICD-10-CM

## 2013-10-22 DIAGNOSIS — Z1613 Resistance to carbapenem: Secondary | ICD-10-CM

## 2013-10-22 DIAGNOSIS — Z66 Do not resuscitate: Secondary | ICD-10-CM | POA: Diagnosis present

## 2013-10-22 DIAGNOSIS — J9610 Chronic respiratory failure, unspecified whether with hypoxia or hypercapnia: Secondary | ICD-10-CM

## 2013-10-22 DIAGNOSIS — K449 Diaphragmatic hernia without obstruction or gangrene: Secondary | ICD-10-CM | POA: Diagnosis present

## 2013-10-22 DIAGNOSIS — I129 Hypertensive chronic kidney disease with stage 1 through stage 4 chronic kidney disease, or unspecified chronic kidney disease: Secondary | ICD-10-CM | POA: Diagnosis present

## 2013-10-22 DIAGNOSIS — Z228 Carrier of other infectious diseases: Secondary | ICD-10-CM

## 2013-10-22 DIAGNOSIS — J309 Allergic rhinitis, unspecified: Secondary | ICD-10-CM

## 2013-10-22 DIAGNOSIS — E1169 Type 2 diabetes mellitus with other specified complication: Secondary | ICD-10-CM | POA: Diagnosis present

## 2013-10-22 DIAGNOSIS — Z888 Allergy status to other drugs, medicaments and biological substances status: Secondary | ICD-10-CM

## 2013-10-22 DIAGNOSIS — M549 Dorsalgia, unspecified: Secondary | ICD-10-CM

## 2013-10-22 DIAGNOSIS — I4892 Unspecified atrial flutter: Secondary | ICD-10-CM

## 2013-10-22 DIAGNOSIS — Z91013 Allergy to seafood: Secondary | ICD-10-CM

## 2013-10-22 DIAGNOSIS — I482 Chronic atrial fibrillation, unspecified: Secondary | ICD-10-CM

## 2013-10-22 DIAGNOSIS — F3289 Other specified depressive episodes: Secondary | ICD-10-CM | POA: Diagnosis present

## 2013-10-22 DIAGNOSIS — J90 Pleural effusion, not elsewhere classified: Secondary | ICD-10-CM

## 2013-10-22 DIAGNOSIS — G4700 Insomnia, unspecified: Secondary | ICD-10-CM

## 2013-10-22 DIAGNOSIS — R531 Weakness: Secondary | ICD-10-CM

## 2013-10-22 DIAGNOSIS — E871 Hypo-osmolality and hyponatremia: Secondary | ICD-10-CM | POA: Diagnosis present

## 2013-10-22 DIAGNOSIS — Z9089 Acquired absence of other organs: Secondary | ICD-10-CM

## 2013-10-22 DIAGNOSIS — E119 Type 2 diabetes mellitus without complications: Secondary | ICD-10-CM

## 2013-10-22 DIAGNOSIS — F329 Major depressive disorder, single episode, unspecified: Secondary | ICD-10-CM | POA: Diagnosis present

## 2013-10-22 DIAGNOSIS — R0602 Shortness of breath: Secondary | ICD-10-CM

## 2013-10-22 DIAGNOSIS — Z880 Allergy status to penicillin: Secondary | ICD-10-CM

## 2013-10-22 DIAGNOSIS — N179 Acute kidney failure, unspecified: Secondary | ICD-10-CM

## 2013-10-22 DIAGNOSIS — R339 Retention of urine, unspecified: Secondary | ICD-10-CM

## 2013-10-22 DIAGNOSIS — G8929 Other chronic pain: Secondary | ICD-10-CM | POA: Diagnosis present

## 2013-10-22 DIAGNOSIS — Z800 Family history of malignant neoplasm of digestive organs: Secondary | ICD-10-CM

## 2013-10-22 DIAGNOSIS — Z79899 Other long term (current) drug therapy: Secondary | ICD-10-CM

## 2013-10-22 DIAGNOSIS — R627 Adult failure to thrive: Secondary | ICD-10-CM

## 2013-10-22 DIAGNOSIS — N184 Chronic kidney disease, stage 4 (severe): Secondary | ICD-10-CM | POA: Diagnosis present

## 2013-10-22 DIAGNOSIS — Z515 Encounter for palliative care: Secondary | ICD-10-CM

## 2013-10-22 DIAGNOSIS — I10 Essential (primary) hypertension: Secondary | ICD-10-CM

## 2013-10-22 DIAGNOSIS — G4733 Obstructive sleep apnea (adult) (pediatric): Secondary | ICD-10-CM

## 2013-10-22 DIAGNOSIS — F32A Depression, unspecified: Secondary | ICD-10-CM

## 2013-10-22 DIAGNOSIS — Z96659 Presence of unspecified artificial knee joint: Secondary | ICD-10-CM

## 2013-10-22 DIAGNOSIS — Z7901 Long term (current) use of anticoagulants: Secondary | ICD-10-CM

## 2013-10-22 DIAGNOSIS — E876 Hypokalemia: Secondary | ICD-10-CM | POA: Diagnosis present

## 2013-10-22 DIAGNOSIS — I509 Heart failure, unspecified: Secondary | ICD-10-CM

## 2013-10-22 DIAGNOSIS — D638 Anemia in other chronic diseases classified elsewhere: Secondary | ICD-10-CM

## 2013-10-22 DIAGNOSIS — K219 Gastro-esophageal reflux disease without esophagitis: Secondary | ICD-10-CM

## 2013-10-22 DIAGNOSIS — I5032 Chronic diastolic (congestive) heart failure: Secondary | ICD-10-CM

## 2013-10-22 DIAGNOSIS — J4489 Other specified chronic obstructive pulmonary disease: Secondary | ICD-10-CM | POA: Diagnosis present

## 2013-10-22 DIAGNOSIS — Z794 Long term (current) use of insulin: Secondary | ICD-10-CM

## 2013-10-22 LAB — CBC
MCH: 28.6 pg (ref 26.0–34.0)
MCHC: 33.6 g/dL (ref 30.0–36.0)
MCV: 85.1 fL (ref 78.0–100.0)
Platelets: 127 10*3/uL — ABNORMAL LOW (ref 150–400)
RBC: 3.95 MIL/uL (ref 3.87–5.11)
RDW: 16 % — ABNORMAL HIGH (ref 11.5–15.5)
WBC: 5.2 10*3/uL (ref 4.0–10.5)

## 2013-10-22 LAB — BASIC METABOLIC PANEL
Calcium: 9.2 mg/dL (ref 8.4–10.5)
Creatinine, Ser: 3.71 mg/dL — ABNORMAL HIGH (ref 0.50–1.10)
GFR calc Af Amer: 12 mL/min — ABNORMAL LOW (ref >90)
GFR calc non Af Amer: 10 mL/min — ABNORMAL LOW (ref >90)
Sodium: 125 mEq/L — ABNORMAL LOW (ref 135–145)

## 2013-10-22 LAB — PRO B NATRIURETIC PEPTIDE: Pro B Natriuretic peptide (BNP): 4942 pg/mL — ABNORMAL HIGH (ref 0–450)

## 2013-10-22 LAB — PROTIME-INR: Prothrombin Time: 24.6 seconds — ABNORMAL HIGH (ref 11.6–15.2)

## 2013-10-22 LAB — MAGNESIUM: Magnesium: 2.3 mg/dL (ref 1.5–2.5)

## 2013-10-22 MED ORDER — NITROGLYCERIN 0.4 MG SL SUBL: 0.4000 mg | SUBLINGUAL_TABLET | SUBLINGUAL | Status: DC | PRN

## 2013-10-22 MED ORDER — WARFARIN - PHARMACIST DOSING INPATIENT
Freq: Every day | Status: DC
  Administered 2013-10-23: 18:00:00

## 2013-10-22 MED ORDER — INSULIN ASPART 100 UNIT/ML ~~LOC~~ SOLN
0.0000 [IU] | Freq: Three times a day (TID) | SUBCUTANEOUS | Status: DC
  Administered 2013-10-23: 3 [IU] via SUBCUTANEOUS
  Administered 2013-10-23: 5 [IU] via SUBCUTANEOUS
  Administered 2013-10-24: 3 [IU] via SUBCUTANEOUS
  Administered 2013-10-24: 2 [IU] via SUBCUTANEOUS
  Administered 2013-10-24: 3 [IU] via SUBCUTANEOUS
  Administered 2013-10-25: 2 [IU] via SUBCUTANEOUS

## 2013-10-22 MED ORDER — INSULIN ASPART 100 UNIT/ML ~~LOC~~ SOLN
5.0000 [IU] | Freq: Three times a day (TID) | SUBCUTANEOUS | Status: DC
  Administered 2013-10-23 (×2): 5 [IU] via SUBCUTANEOUS

## 2013-10-22 MED ORDER — SODIUM CHLORIDE 0.9 % IV SOLN: 250.0000 mL | INTRAVENOUS | Status: DC | PRN

## 2013-10-22 MED ORDER — ALLOPURINOL 100 MG PO TABS
100.0000 mg | ORAL_TABLET | Freq: Every day | ORAL | Status: DC
  Administered 2013-10-23 – 2013-10-25 (×3): 100 mg via ORAL
  Filled 2013-10-22 (×3): qty 1

## 2013-10-22 MED ORDER — TRAZODONE HCL 50 MG PO TABS
50.0000 mg | ORAL_TABLET | Freq: Every day | ORAL | Status: DC
  Administered 2013-10-22 – 2013-10-24 (×3): 50 mg via ORAL
  Filled 2013-10-22 (×4): qty 1

## 2013-10-22 MED ORDER — FUROSEMIDE 10 MG/ML IJ SOLN
80.0000 mg | Freq: Three times a day (TID) | INTRAMUSCULAR | Status: DC
  Administered 2013-10-22 – 2013-10-24 (×6): 80 mg via INTRAVENOUS
  Filled 2013-10-22 (×10): qty 8

## 2013-10-22 MED ORDER — POTASSIUM CHLORIDE 20 MEQ/15ML (10%) PO LIQD
40.0000 meq | Freq: Once | ORAL | Status: DC
  Filled 2013-10-22 (×2): qty 30

## 2013-10-22 MED ORDER — SODIUM CHLORIDE 0.9 % IJ SOLN: 3.0000 mL | INTRAMUSCULAR | Status: DC | PRN

## 2013-10-22 MED ORDER — ALPRAZOLAM 0.25 MG PO TABS
0.1250 mg | ORAL_TABLET | Freq: Three times a day (TID) | ORAL | Status: DC | PRN
  Administered 2013-10-23: 0.125 mg via ORAL
  Administered 2013-10-25: 10:00:00 via ORAL
  Filled 2013-10-22 (×2): qty 1

## 2013-10-22 MED ORDER — ACETAMINOPHEN 325 MG PO TABS: 650.0000 mg | ORAL_TABLET | ORAL | Status: DC | PRN

## 2013-10-22 MED ORDER — OXYCODONE HCL 5 MG PO TABS
5.0000 mg | ORAL_TABLET | Freq: Four times a day (QID) | ORAL | Status: DC | PRN
  Administered 2013-10-23 – 2013-10-24 (×5): 5 mg via ORAL
  Filled 2013-10-22 (×5): qty 1

## 2013-10-22 MED ORDER — FUROSEMIDE 10 MG/ML IJ SOLN
80.0000 mg | Freq: Two times a day (BID) | INTRAMUSCULAR | Status: DC
Start: 2013-10-23 — End: 2013-10-22

## 2013-10-22 MED ORDER — BUSPIRONE HCL 5 MG PO TABS
5.0000 mg | ORAL_TABLET | Freq: Two times a day (BID) | ORAL | Status: DC
  Administered 2013-10-22 – 2013-10-25 (×6): 5 mg via ORAL
  Filled 2013-10-22 (×7): qty 1

## 2013-10-22 MED ORDER — SODIUM CHLORIDE 0.9 % IJ SOLN
3.0000 mL | Freq: Two times a day (BID) | INTRAMUSCULAR | Status: DC
Start: 2013-10-22 — End: 2013-10-25
  Administered 2013-10-22 – 2013-10-24 (×5): 3 mL via INTRAVENOUS

## 2013-10-22 MED ORDER — FLUTICASONE PROPIONATE 50 MCG/ACT NA SUSP
1.0000 | Freq: Every day | NASAL | Status: DC
  Administered 2013-10-23 – 2013-10-25 (×3): 1 via NASAL
  Filled 2013-10-22: qty 16

## 2013-10-22 MED ORDER — PANTOPRAZOLE SODIUM 40 MG PO TBEC
40.0000 mg | DELAYED_RELEASE_TABLET | Freq: Every day | ORAL | Status: DC
  Administered 2013-10-23 – 2013-10-24 (×2): 40 mg via ORAL
  Filled 2013-10-22 (×2): qty 1

## 2013-10-22 MED ORDER — ONDANSETRON HCL 4 MG/2ML IJ SOLN: 4.0000 mg | Freq: Four times a day (QID) | INTRAMUSCULAR | Status: DC | PRN

## 2013-10-22 MED ORDER — DOCUSATE SODIUM 100 MG PO CAPS
100.0000 mg | ORAL_CAPSULE | Freq: Every day | ORAL | Status: DC
  Administered 2013-10-23 – 2013-10-25 (×3): 100 mg via ORAL
  Filled 2013-10-22 (×3): qty 1

## 2013-10-22 MED ORDER — POLYETHYLENE GLYCOL 3350 17 G PO PACK
17.0000 g | PACK | Freq: Two times a day (BID) | ORAL | Status: DC
  Administered 2013-10-23 – 2013-10-25 (×5): 17 g via ORAL
  Filled 2013-10-22 (×7): qty 1

## 2013-10-22 MED ORDER — AMIODARONE HCL 200 MG PO TABS
200.0000 mg | ORAL_TABLET | Freq: Every day | ORAL | Status: DC
Start: 2013-10-22 — End: 2013-10-25
  Administered 2013-10-22 – 2013-10-25 (×4): 200 mg via ORAL
  Filled 2013-10-22 (×4): qty 1

## 2013-10-22 MED ORDER — FUROSEMIDE 10 MG/ML IJ SOLN
80.0000 mg | Freq: Once | INTRAMUSCULAR | Status: AC
  Administered 2013-10-22: 80 mg via INTRAVENOUS
  Filled 2013-10-22: qty 8

## 2013-10-22 MED ORDER — WARFARIN SODIUM 2.5 MG PO TABS
2.5000 mg | ORAL_TABLET | Freq: Once | ORAL | Status: AC
  Administered 2013-10-22: 2.5 mg via ORAL
  Filled 2013-10-22: qty 1

## 2013-10-22 MED ORDER — GUAIFENESIN ER 600 MG PO TB12
600.0000 mg | ORAL_TABLET | Freq: Every day | ORAL | Status: DC
Start: 2013-10-23 — End: 2013-10-25
  Administered 2013-10-23 – 2013-10-25 (×3): 600 mg via ORAL
  Filled 2013-10-22 (×3): qty 1

## 2013-10-22 MED ORDER — CARVEDILOL 12.5 MG PO TABS
12.5000 mg | ORAL_TABLET | Freq: Two times a day (BID) | ORAL | Status: DC
  Administered 2013-10-23 – 2013-10-25 (×4): 12.5 mg via ORAL
  Filled 2013-10-22 (×7): qty 1

## 2013-10-22 MED ORDER — POTASSIUM CHLORIDE 20 MEQ/15ML (10%) PO LIQD
40.0000 meq | Freq: Every day | ORAL | Status: DC
  Administered 2013-10-22: 40 meq via ORAL
  Filled 2013-10-22 (×2): qty 30

## 2013-10-22 MED ORDER — ONDANSETRON HCL 4 MG PO TABS: 4.0000 mg | ORAL_TABLET | Freq: Four times a day (QID) | ORAL | Status: DC | PRN

## 2013-10-22 NOTE — ED Provider Notes (Signed)
CSN: [id number]     Arrival date & time 10/22/13  1448 History   First MD Initiated Contact with Patient 10/22/13 1520     Chief Complaint  Patient presents with  . Weakness   (Consider location/radiation/quality/duration/timing/severity/associated sxs/prior Treatment) Patient is a 79 y.o. female presenting with weakness.  Weakness   Pt with history of CHF reports 10lb weight gain over the last 2 weeks (weighs daily at home) with associated weakness, abdominal distension and moderate SOB. Denies CP, no cough or fever. Has had occasional diarrhea. Decreased UOP despite compliance with home diuretic regimen. Took an extra dose of Metazolone yesterday without improvement.   Past Medical History  Diagnosis Date  . GERD (gastroesophageal reflux disease)   . Hiatal hernia   . HTN (hypertension)     all her life  . Atrial fibrillation     since 1996  . Depression   . Chronic diastolic heart failure 08/31/2012  . Tibia fracture     BILATERAL  . Respiratory failure     acute on chronic hx of  requiring BIPAP  . COPD (chronic obstructive pulmonary disease)   . Metabolic encephalopathy     hx of   . UTI (urinary tract infection)     hx of   . Pleural effusion     right sided now resolved   . CHF (congestive heart failure)     diastolic HF  . Complication of anesthesia     "hard to wake up" (03/11/2013)  . OSA on CPAP   . Type II diabetes mellitus 1980's?  . History of blood transfusion 08/2012    "related to leg OR" (03/11/2013)  . Arthritis     "left arm; lower back, hips, hands" (03/11/2013)  . Chronic lower back pain     "disc pops in and out" (03/11/2013)  . Gout   . Anxiety   . Fall 08/2012    "in nursing home" (03/11/2013)  . Anemia   . Chronic a-fib 03/12/2013  . CKD (chronic kidney disease), stage III     GFR: 38  . Renal failure     acute on chronic   . Renal failure     acute on chronic with need for intermittent dialysis - hx of    Past Surgical History  Procedure  Laterality Date  . Bunionectomy Bilateral     bilateral  . Cholecystectomy    . Shoulder arthroscopy w/ rotator cuff repair Left 2002  . Total knee arthroplasty Bilateral 2001  . Hammer toe surgery Bilateral 2004  . Colonoscopy  07/11/2011    Procedure: COLONOSCOPY;  Surgeon: Malissa Hippo, MD;  Location: [facility];  Service: Endoscopy;  Laterality: N/A;  3:00  . Orif periprosthetic fracture  09/08/2012    Procedure: OPEN REDUCTION INTERNAL FIXATION (ORIF) PERIPROSTHETIC FRACTURE;  Surgeon: Shelda Pal, MD;  Location: [facility] ORS;  Service: Orthopedics;  Laterality: Right;  ORIF periprosthetic right proximal femur fracture   . External fixation leg  09/08/2012    Procedure: EXTERNAL FIXATION LEG;  Surgeon: Shelda Pal, MD;  Location: [facility] ORS;  Service: Orthopedics;  Laterality: Left;  . Joint replacement    . External fixation leg  11/30/2012    Procedure: EXTERNAL FIXATION LEG;  Surgeon: Shelda Pal, MD;  Location: [facility] ORS;  Service: Orthopedics;  Laterality: Left;  Removal of External Fixation Left Knee with Evaluation with Floroscopy  . Tee without cardioversion N/A 01/19/2013    Procedure: TRANSESOPHAGEAL ECHOCARDIOGRAM (TEE);  Surgeon: Lewayne Bunting, MD;  Location: [facility] ENDOSCOPY;  Service: Cardiovascular;  Laterality: N/A;  . Shoulder open rotator cuff repair Left 2002    "maybe 3 months after scope" (03/11/2013)  . Femur fracture surgery Right 08/2012    "fell at nursing home" (03/11/2013)  . Tibia fracture surgery Left 08/2012    "fell at nursing home" (03/11/2013)   Family History  Problem Relation Age of Onset  . Colon cancer Brother    History  Substance Use Topics  . Smoking status: Never Smoker   . Smokeless tobacco: Never Used  . Alcohol Use: No   OB History   Grav Para Term Preterm Abortions TAB SAB Ect Mult Living                 Review of Systems  Neurological: Positive for weakness.   All other systems reviewed and are negative except as noted in HPI.    Allergies  Morphine sulfate; Remeron; Tuna; and Penicillins  Home Medications   Current Outpatient Rx  Name  Route  Sig  Dispense  Refill  . acetaminophen (TYLENOL) 325 MG tablet   Oral   Take 650 mg by mouth every 6 (six) hours as needed for pain.         Marland Kitchen allopurinol (ZYLOPRIM) 100 MG tablet   Oral   Take 100 mg by mouth daily.         Marland Kitchen ALPRAZolam (XANAX) 0.25 MG tablet   Oral   Take 0.125 mg by mouth every 8 (eight) hours as needed for anxiety.         Marland Kitchen amiodarone (PACERONE) 200 MG tablet   Oral   Take 1 tablet (200 mg total) by mouth daily.   30 tablet   3     Please file dosage change   . busPIRone (BUSPAR) 5 MG tablet   Oral   Take 5 mg by mouth every 12 (twelve) hours.          . carvedilol (COREG) 12.5 MG tablet   Oral   Take 1 tablet (12.5 mg total) by mouth 2 (two) times daily with a meal.   60 tablet   3   . docusate sodium (COLACE) 100 MG capsule   Oral   Take 100 mg by mouth daily.          . fluticasone (FLONASE) 50 MCG/ACT nasal spray   Nasal   Place 1 spray into the nose daily.         Marland Kitchen guaiFENesin (MUCINEX) 600 MG 12 hr tablet   Oral   Take 600 mg by mouth daily.          . insulin aspart (NOVOLOG) 100 UNIT/ML injection   Subcutaneous   Inject 5 Units into the skin 3 (three) times daily with meals. If cbg is greater than 150         . metolazone (ZAROXOLYN) 2.5 MG tablet      On Mondays, can take on Fri if needed         . omeprazole (PRILOSEC) 20 MG capsule   Oral   Take 40 mg by mouth daily.          . ondansetron (ZOFRAN) 4 MG tablet   Oral   Take 1 tablet (4 mg total) by mouth every 6 (six) hours as needed for nausea.   20 tablet      . oxyCODONE (ROXICODONE) 5 MG immediate release tablet   Oral  Take 1 tablet (5 mg total) by mouth every 6 (six) hours as needed for pain.   30 tablet   0   . polyethylene glycol (MIRALAX / GLYCOLAX) packet   Oral   Take 17 g by mouth 2 (two) times daily.           Marland Kitchen torsemide (DEMADEX) 20 MG tablet      If weight is 180 or less take 60 mg Twice daily, if weight is over 180 take 80 mg Twice daily   240 tablet   3     DOES NOT NEED FILLED AT THIS TIME, PLEASE FILE NEW ...   . traZODone (DESYREL) 50 MG tablet   Oral   Take 50 mg by mouth at bedtime.         Marland Kitchen warfarin (COUMADIN) 5 MG tablet   Oral   Take 0.5 tablets (2.5 mg total) by mouth daily. Or as directed   30 tablet   3   . nitroGLYCERIN (NITROSTAT) 0.4 MG SL tablet   Sublingual   Place 0.4 mg under the tongue every 5 (five) minutes as needed for chest pain.          BP 139/73  Pulse 85  Temp(Src) 98.6 F (37 C) (Oral)  Resp 12  Ht 5\' 3"  (1.6 m)  Wt 190 lb (86.183 kg)  BMI 33.67 kg/m2  SpO2 98% Physical Exam  Nursing note and vitals reviewed. Constitutional: She is oriented to person, place, and time. She appears well-developed and well-nourished.  HENT:  Head: Normocephalic and atraumatic.  Eyes: EOM are normal. Pupils are equal, round, and reactive to light.  Neck: Normal range of motion. Neck supple. JVD present.  Cardiovascular: Normal rate, normal heart sounds and intact distal pulses.   Pulmonary/Chest: Effort normal and breath sounds normal.  Abdominal: Bowel sounds are normal. She exhibits no distension. There is no tenderness.  Musculoskeletal: Normal range of motion. She exhibits no tenderness. Edema: mild BLE.  Neurological: She is alert and oriented to person, place, and time. She has normal strength. No cranial nerve deficit or sensory deficit.  Skin: Skin is warm and dry. No rash noted.  Psychiatric: She has a normal mood and affect.    ED Course  Procedures (including critical care time) Labs Review Labs Reviewed  CBC - Abnormal; Notable for the following:    Hemoglobin 11.3 (*)    HCT 33.6 (*)    RDW 16.0 (*)    Platelets 127 (*)    All other components within normal limits  BASIC METABOLIC PANEL - Abnormal; Notable for the following:     Sodium 125 (*)    Potassium 2.9 (*)    Chloride 80 (*)    CO2 36 (*)    Glucose, Bld 167 (*)    BUN 101 (*)    Creatinine, Ser 3.71 (*)    GFR calc non Af Amer 10 (*)    GFR calc Af Amer 12 (*)    All other components within normal limits  PRO B NATRIURETIC PEPTIDE - Abnormal; Notable for the following:    Pro B Natriuretic peptide (BNP) 4942.0 (*)    All other components within normal limits  PROTIME-INR - Abnormal; Notable for the following:    Prothrombin Time 24.6 (*)    INR 2.31 (*)    All other components within normal limits  POCT I-STAT TROPONIN I   Imaging Review Dg Chest 2 View  10/22/2013   CLINICAL DATA:  Shortness of breath, weight gain  EXAM: CHEST - 2 VIEW  COMPARISON:  08/18/2013  FINDINGS: Stable moderate cardiomegaly. Progression of right pleural effusion, now moderate in size. There is a trace left pleural effusion. Mild central pulmonary vascular congestion. Degenerative changes in the mid and lower thoracic spine and bilateral shoulders. Atheromatous aorta.  IMPRESSION: 1. Progression of moderate right and small left pleural effusions. 2. Cardiomegaly and pulmonary vascular congestion as before.   Electronically Signed   By: Oley Balm M.D.   On: 10/22/2013 17:51    EKG Interpretation    Date/Time:  Friday October 22 2013 14:57:27 EST Ventricular Rate:  66 PR Interval:    QRS Duration: 120 QT Interval:  504 QTC Calculation: 528 R Axis:   -83 Text Interpretation:  Atrial flutter with variable A-V block Left axis deviation Low voltage QRS Septal infarct , age undetermined Abnormal ECG No significant change since last tracing Confirmed by SHELDON  MD, CHARLES [phone]) on 10/22/2013 4:06:18 PM            MDM   1. CHF (congestive heart failure)   2. CKD (chronic kidney disease)     Labs and imaging reviewed and indicate CHF exacerbation with several other moderate electrolyte abnormalities. Discussed with Hospitalist who will admit.      Charles B. Bernette Mayers, MD 10/22/13 [phone]

## 2013-10-22 NOTE — H&P (Signed)
[facility] History and Physical  Gina Ashley QMV:[id number] DOB: 10/29/1927 DOA: 10/22/2013  Referring physician:  PCP: Allean Found, MD  Specialists:   Chief Complaint: Shortness of breath and weight gain  HPI: Gina Ashley is a 79 y.o. female  With a history of diastolic CHF, atrial fibrillation requiring anticoagulation, hypertension, anemia, CKD stage IV, that presents emergency department with complaints of shortness of breath and weight gain. Over the last week patient has had increasing weight gain of approximately 10 pounds.  Her daughter accompanies her and has her weights written for the last few weeks. Patient also has home health with her as well. Patient supposedly contacted the cardiology office and was told to get the toes on however patient's daughter brought her to the emergency department. Patient states she is normally very active however over the last week she's been having increasing shortness of breath.  Patient denies any chest pain, recent illness, recent travel.  Review of Systems:  Constitutional: Denies fever, chills, diaphoresis, appetite change and fatigue.  HEENT: Denies photophobia, eye pain, redness, hearing loss, ear pain, congestion, sore throat, rhinorrhea, sneezing, mouth sores, trouble swallowing, neck pain, neck stiffness and tinnitus.   Respiratory: Admits to shortness of breath. Denies DOE, cough, chest tightness,  and wheezing.   Cardiovascular: Denies chest pain, palpitations and leg swelling.  Gastrointestinal: Denies nausea, vomiting, abdominal pain, diarrhea, constipation, blood in stool and abdominal distention.  Genitourinary: Denies dysuria, urgency, frequency, hematuria, flank pain and difficulty urinating.  Musculoskeletal: Denies myalgias, back pain, joint swelling, arthralgias and gait problem.  Skin: Denies pallor, rash and wound.  Neurological: Denies dizziness, seizures, syncope, weakness, light-headedness, numbness and  headaches.  Hematological: Denies adenopathy. Easy bruising, personal or family bleeding history  Psychiatric/Behavioral: Denies suicidal ideation, mood changes, confusion, nervousness, sleep disturbance and agitation  Past Medical History  Diagnosis Date  . GERD (gastroesophageal reflux disease)   . Hiatal hernia   . HTN (hypertension)     all her life  . Atrial fibrillation     since 1996  . Depression   . Chronic diastolic heart failure 08/31/2012  . Tibia fracture     BILATERAL  . Respiratory failure     acute on chronic hx of  requiring BIPAP  . COPD (chronic obstructive pulmonary disease)   . Metabolic encephalopathy     hx of   . UTI (urinary tract infection)     hx of   . Pleural effusion     right sided now resolved   . CHF (congestive heart failure)     diastolic HF  . Complication of anesthesia     "hard to wake up" (03/11/2013)  . OSA on CPAP   . Type II diabetes mellitus 1980's?  . History of blood transfusion 08/2012    "related to leg OR" (03/11/2013)  . Arthritis     "left arm; lower back, hips, hands" (03/11/2013)  . Chronic lower back pain     "disc pops in and out" (03/11/2013)  . Gout   . Anxiety   . Fall 08/2012    "in nursing home" (03/11/2013)  . Anemia   . Chronic a-fib 03/12/2013  . CKD (chronic kidney disease), stage III     GFR: 38  . Renal failure     acute on chronic   . Renal failure     acute on chronic with need for intermittent dialysis - hx of    Past Surgical History  Procedure Laterality Date  . Bunionectomy  Bilateral     bilateral  . Cholecystectomy    . Shoulder arthroscopy w/ rotator cuff repair Left 2002  . Total knee arthroplasty Bilateral 2001  . Hammer toe surgery Bilateral 2004  . Colonoscopy  07/11/2011    Procedure: COLONOSCOPY;  Surgeon: Malissa Hippo, MD;  Location: [facility];  Service: Endoscopy;  Laterality: N/A;  3:00  . Orif periprosthetic fracture  09/08/2012    Procedure: OPEN REDUCTION INTERNAL FIXATION (ORIF)  PERIPROSTHETIC FRACTURE;  Surgeon: Shelda Pal, MD;  Location: [facility];  Service: Orthopedics;  Laterality: Right;  ORIF periprosthetic right proximal femur fracture   . External fixation leg  09/08/2012    Procedure: EXTERNAL FIXATION LEG;  Surgeon: Shelda Pal, MD;  Location: [facility];  Service: Orthopedics;  Laterality: Left;  . Joint replacement    . External fixation leg  11/30/2012    Procedure: EXTERNAL FIXATION LEG;  Surgeon: Shelda Pal, MD;  Location: [facility];  Service: Orthopedics;  Laterality: Left;  Removal of External Fixation Left Knee with Evaluation with Floroscopy  . Tee without cardioversion N/A 01/19/2013    Procedure: TRANSESOPHAGEAL ECHOCARDIOGRAM (TEE);  Surgeon: Lewayne Bunting, MD;  Location: [facility] ENDOS[facility];  Service: Cardiovascular;  Laterality: N/A;  . Shoulder open rotator cuff repair Left 2002    "maybe 3 months after scope" (03/11/2013)  . Femur fracture surgery Right 08/2012    "fell at nursing home" (03/11/2013)  . Tibia fracture surgery Left 08/2012    "fell at nursing home" (03/11/2013)   Social History:  reports that she has never smoked. She has never used smokeless tobacco. She reports that she does not drink alcohol or use illicit drugs. Patient was at home with her daughter. She does have a walker that she uses for ambulation.  Allergies  Allergen Reactions  . Morphine Sulfate Other (See Comments)    REACTION: change in personality  . Remeron [Mirtazapine] Other (See Comments)    Altered mental status, lethargy  . Prescott Gum [Fish Allergy] Other (See Comments)    unknown  . Penicillins Rash    Tolerates Ancef.    Family History  Problem Relation Age of Onset  . Colon cancer Brother     Prior to Admission medications   Medication Sig Start Date End Date Taking? Authorizing Provider  acetaminophen (TYLENOL) 325 MG tablet Take 650 mg by mouth every 6 (six) hours as needed for pain.   Yes Historical Provider, MD  allopurinol (ZYLOPRIM) 100 MG tablet Take  100 mg by mouth daily.   Yes Historical Provider, MD  ALPRAZolam (XANAX) 0.25 MG tablet Take 0.125 mg by mouth every 8 (eight) hours as needed for anxiety.   Yes Historical Provider, MD  amiodarone (PACERONE) 200 MG tablet Take 1 tablet (200 mg total) by mouth daily. 07/16/13  Yes Aundria Rud, NP  busPIRone (BUSPAR) 5 MG tablet Take 5 mg by mouth every 12 (twelve) hours.    Yes Historical Provider, MD  carvedilol (COREG) 12.5 MG tablet Take 1 tablet (12.5 mg total) by mouth 2 (two) times daily with a meal. 07/07/13  Yes Tora Kindred York, PA-C  docusate sodium (COLACE) 100 MG capsule Take 100 mg by mouth daily.    Yes Historical Provider, MD  fluticasone (FLONASE) 50 MCG/ACT nasal spray Place 1 spray into the nose daily.   Yes Historical Provider, MD  guaiFENesin (MUCINEX) 600 MG 12 hr tablet Take 600 mg by mouth daily.    Yes Historical Provider, MD  insulin aspart (NOVOLOG) 100 UNIT/ML injection Inject 5 Units into the skin 3 (three) times daily with meals. If cbg is greater than 150 09/15/12  Yes Lesle Chris Black, NP  metolazone (ZAROXOLYN) 2.5 MG tablet On Mondays, can take on Fri if needed 07/16/13  Yes Aundria Rud, NP  omeprazole (PRILOSEC) 20 MG capsule Take 40 mg by mouth daily.    Yes Historical Provider, MD  ondansetron (ZOFRAN) 4 MG tablet Take 1 tablet (4 mg total) by mouth every 6 (six) hours as needed for nausea. 02/12/13  Yes Shanker Levora Dredge, MD  oxyCODONE (ROXICODONE) 5 MG immediate release tablet Take 1 tablet (5 mg total) by mouth every 6 (six) hours as needed for pain. 07/07/13  Yes Marianne L York, PA-C  polyethylene glycol (MIRALAX / GLYCOLAX) packet Take 17 g by mouth 2 (two) times daily.    Yes Historical Provider, MD  torsemide (DEMADEX) 20 MG tablet If weight is 180 or less take 60 mg Twice daily, if weight is over 180 take 80 mg Twice daily 09/22/13  Yes Aundria Rud, NP  traZODone (DESYREL) 50 MG tablet Take 50 mg by mouth at bedtime.   Yes Historical Provider, MD  warfarin  (COUMADIN) 5 MG tablet Take 0.5 tablets (2.5 mg total) by mouth daily. Or as directed 10/11/13  Yes Dolores Patty, MD  nitroGLYCERIN (NITROSTAT) 0.4 MG SL tablet Place 0.4 mg under the tongue every 5 (five) minutes as needed for chest pain.    Historical Provider, MD   Physical Exam: Filed Vitals:   10/22/13 1830  BP: 127/69  Pulse: 73  Temp:   Resp: 14     General: Well developed, well nourished, NAD, appears stated age  HEENT: NCAT, PERRLA, EOMI, Anicteic Sclera, mucous membranes moist. No pharyngeal erythema or exudates  Neck: Supple, no JVD, no masses  Cardiovascular: S1 S2 auscultated, irregularly irregular   Respiratory: Clear but diminished breath sounds noted throughout all lung fields particularly at the bases   Abdomen: Soft, nontender, nondistended, + bowel sounds  Extremities: warm dry without cyanosis clubbing. Trace edema in lower extremities bilaterally   Neuro: AAOx3, cranial nerves grossly intact. Strength 5/5 in patient's upper and lower extremities bilaterally  Skin: Without rashes exudates or nodules  Psych: Normal affect and demeanor with intact judgement and insight  Labs on Admission:  Basic Metabolic Panel:  Recent Labs Lab 10/22/13 1628  NA 125*  K 2.9*  CL 80*  CO2 36*  GLUCOSE 167*  BUN 101*  CREATININE 3.71*  CALCIUM 9.2   Liver Function Tests: No results found for this basename: AST, ALT, ALKPHOS, BILITOT, PROT, ALBUMIN,  in the last 168 hours No results found for this basename: LIPASE, AMYLASE,  in the last 168 hours No results found for this basename: AMMONIA,  in the last 168 hours CBC:  Recent Labs Lab 10/22/13 1628  WBC 5.2  HGB 11.3*  HCT 33.6*  MCV 85.1  PLT 127*   Cardiac Enzymes: No results found for this basename: CKTOTAL, CKMB, CKMBINDEX, TROPONINI,  in the last 168 hours  BNP (last 3 results)  Recent Labs  06/13/13 1714 07/05/13 1448 10/22/13 1628  PROBNP 3655.0* 3388.0* 4942.0*   CBG: No results  found for this basename: GLUCAP,  in the last 168 hours  Radiological Exams on Admission: Dg Chest 2 View  10/22/2013   CLINICAL DATA:  Shortness of breath, weight gain  EXAM: CHEST - 2 VIEW  COMPARISON:  08/18/2013  FINDINGS: Stable  moderate cardiomegaly. Progression of right pleural effusion, now moderate in size. There is a trace left pleural effusion. Mild central pulmonary vascular congestion. Degenerative changes in the mid and lower thoracic spine and bilateral shoulders. Atheromatous aorta.  IMPRESSION: 1. Progression of moderate right and small left pleural effusions. 2. Cardiomegaly and pulmonary vascular congestion as before.   Electronically Signed   By: Oley Balm M.D.   On: 10/22/2013 17:51    EKG: Independently reviewed. A flutter rate of 66 left axis deviation  Assessment/Plan Principal Problem:   CHF exacerbation Active Problems:   DM   HYPERTENSION   GERD   Anemia due to chronic illness   CKD (chronic kidney disease)   Chronic diastolic heart failure   Obesity (BMI 30-39.9)   Depression   Shortness of breath   Renal failure (ARF), acute on chronic   Chronic a-fib   CHF (congestive heart failure)  Acute diastolic CHF exacerbation Patient will be admitted to the step down unit. Patient was discussed with cardiology. At this time will hold giving diuretics. Patient usually takes torsemide 80 mg twice daily and metolazone 2.5 mg on Mondays and Fridays as needed. After speaking with cardiology, will defer diuretic use to them.  Last EF was noted to be 55-60% in March of 2014.  Will continue daily weights, strict intake and output, fluid restriction.  Atrial fibrillation, a flutter Will continue patient on amiodarone. She is currently rate controlled. Will continue patient on warfarin however have pharmacy to follow and dose. INR is currently therapeutic. Will obtain magnesium, phosphate, TSH levels.  Hypokalemia likely secondary to hypervolemia versus  diuretics Will replace potassium carefully due to renal disease. Will continue to monitor BMP. Will also obtain magnesium level.  Acute on chronic kidney disease, stage IV At this time creatinine is 3.7. Due to her CHF exacerbation patient will likely be placed on diuretics. Will continue to monitor her BMP.  Hyponatremia likely secondary to hypervolemia Will continue to monitor patient's BMP.  Hypertension Will continue her home medications of carvedilol.  Diabetes mellitus Will continue patient on her NovoLog, as well as insulin sliding scale and CBG monitoring. Will also obtain hemoglobin A1c.  GERD Will continue PPI.  Depression Will continue patient on trazodone, Buspar.  Anemia of chronic disease Patient's hemoglobin is about her baseline 11.3. Will continue to monitor CBC.  DVT prophylaxis: Warfarin  Code Status: Full  Condition: Guarded  Family Communication: Daughter at bedside. Admission, patients condition and plan of care including tests being ordered have been discussed with the patient and daughter who indicate understanding and agree with the plan and Code Status.  Disposition Plan: Admitted, expected length of stay 2-3 midnights   Time spent: 45 minutes   Nymir Ringler D.O. [facility] Pager [phone]  If 7PM-7AM, please contact night-coverage [email or web address] Password [email or web address] 10/22/2013, 7:05 PM

## 2013-10-22 NOTE — Progress Notes (Signed)
Unit CM UR Completed by [facility] ED CM  W. Mahogani Holohan RN  

## 2013-10-22 NOTE — Consult Note (Signed)
CARDIOLOGY CONSULT NOTE   Patient ID: Gina Ashley MRN: [id number] DOB/AGE: 01/03/1927 79 y.o.  Admit date: 10/22/2013  Primary Physician   Allean Found, MD Primary Cardiologist   DB, CHF clinic Reason for Consultation   CHF  Gina Ashley is a 79 y.o. female with a history of chronic atrial fibrillation on coumadin, diastolic heart failure and HTN. She also has DM type 2, COPD, OSA on CPAP, and CKD failure with baseline Cr 2.2. She has had 5 hospitalizations in the last 6 months, 1 included tibia/fibula fracture, 1 for UTI, and the rest have been due to massive fluid overload in the setting of diastolic heart failure. Per nephrology she is not a candidate for HD.   Admitted to [facility] 03/20/13 with increased dyspnea and lower extremity edema. Diuresed with IV lasix and transitioned to Demadex 60 mg bid. She received R thoracentesis. Discharge weight 217 pounds. Discharged to [facility] 03/30/13. Palliative Care to follow at SNF.   Admitted to [facility] 07/05/13 through 07/07/13 with generalized weakness. Treated for UTI and she will continue with indwelling catheter due to urinary retention. Evaluated by Renal >decreased carvedilol 12.5 mg twice a day and Metolazone was stopped. Continued on torsemide 80 mg bid. Discharge weight 197 pounds.  Seen in office 12/8, weight 190 but up about 10 lbs by office scales, pt to take metolazone 2.5 mg that day and weekly. Pt was to increase torsemide from 60 mg BID to 80 mg BID if weight stayed > 180 lbs.   12/15, hypotensive, BP 86/72, diuretics held that day, then restarted. She had taken the torsemide at 80 mg BID the day before for weight gain.  Since her weight was still up, she has continued to take torsemide at 80 mg BID and had metolazone Tuesday and today.   She has continued to gain weight, and has developed orthopnea, possibly some PND as well. No chest pain or palpitations.      Past Medical History  Diagnosis Date  . GERD  (gastroesophageal reflux disease)   . Hiatal hernia   . HTN (hypertension)     all her life  . Atrial fibrillation     since 1996  . Depression   . Chronic diastolic heart failure 08/31/2012  . Tibia fracture     BILATERAL  . Respiratory failure     acute on chronic hx of  requiring BIPAP  . COPD (chronic obstructive pulmonary disease)   . Metabolic encephalopathy     hx of   . UTI (urinary tract infection)     hx of   . Pleural effusion     right sided now resolved   . CHF (congestive heart failure)     diastolic HF  . Complication of anesthesia     "hard to wake up" (03/11/2013)  . OSA on CPAP   . Type II diabetes mellitus 1980's?  . History of blood transfusion 08/2012    "related to leg OR" (03/11/2013)  . Arthritis     "left arm; lower back, hips, hands" (03/11/2013)  . Chronic lower back pain     "disc pops in and out" (03/11/2013)  . Gout   . Anxiety   . Fall 08/2012    "in nursing home" (03/11/2013)  . Anemia   . Chronic a-fib 03/12/2013  . CKD (chronic kidney disease), stage III     GFR: 38  . Renal failure     acute on chronic   .  Renal failure     acute on chronic with need for intermittent dialysis - hx of      Past Surgical History  Procedure Laterality Date  . Bunionectomy Bilateral     bilateral  . Cholecystectomy    . Shoulder arthroscopy w/ rotator cuff repair Left 2002  . Total knee arthroplasty Bilateral 2001  . Hammer toe surgery Bilateral 2004  . Colonoscopy  07/11/2011    Procedure: COLONOSCOPY;  Surgeon: Malissa Hippo, MD;  Location: [facility];  Service: Endoscopy;  Laterality: N/A;  3:00  . Orif periprosthetic fracture  09/08/2012    Procedure: OPEN REDUCTION INTERNAL FIXATION (ORIF) PERIPROSTHETIC FRACTURE;  Surgeon: Shelda Pal, MD;  Location: [facility] ORS;  Service: Orthopedics;  Laterality: Right;  ORIF periprosthetic right proximal femur fracture   . External fixation leg  09/08/2012    Procedure: EXTERNAL FIXATION LEG;  Surgeon: Shelda Pal, MD;  Location: [facility] ORS;  Service: Orthopedics;  Laterality: Left;  . Joint replacement    . External fixation leg  11/30/2012    Procedure: EXTERNAL FIXATION LEG;  Surgeon: Shelda Pal, MD;  Location: [facility] ORS;  Service: Orthopedics;  Laterality: Left;  Removal of External Fixation Left Knee with Evaluation with Floroscopy  . Tee without cardioversion N/A 01/19/2013    Procedure: TRANSESOPHAGEAL ECHOCARDIOGRAM (TEE);  Surgeon: Lewayne Bunting, MD;  Location: [facility] ENDOSCOPY;  Service: Cardiovascular;  Laterality: N/A;  . Shoulder open rotator cuff repair Left 2002    "maybe 3 months after scope" (03/11/2013)  . Femur fracture surgery Right 08/2012    "fell at nursing home" (03/11/2013)  . Tibia fracture surgery Left 08/2012    "fell at nursing home" (03/11/2013)    Allergies  Allergen Reactions  . Morphine Sulfate Other (See Comments)    REACTION: change in personality  . Remeron [Mirtazapine] Other (See Comments)    Altered mental status, lethargy  . Prescott Gum [Fish Allergy] Other (See Comments)    unknown  . Penicillins Rash    Tolerates Ancef.    I have reviewed the patient's current medications . furosemide  80 mg Intravenous Once   Prior to Admission medications   Medication Sig Start Date End Date Taking? Authorizing Provider  acetaminophen (TYLENOL) 325 MG tablet Take 650 mg by mouth every 6 (six) hours as needed for pain.   Yes Historical Provider, MD  allopurinol (ZYLOPRIM) 100 MG tablet Take 100 mg by mouth daily.   Yes Historical Provider, MD  ALPRAZolam (XANAX) 0.25 MG tablet Take 0.125 mg by mouth every 8 (eight) hours as needed for anxiety.   Yes Historical Provider, MD  amiodarone (PACERONE) 200 MG tablet Take 1 tablet (200 mg total) by mouth daily. 07/16/13  Yes Aundria Rud, NP  busPIRone (BUSPAR) 5 MG tablet Take 5 mg by mouth every 12 (twelve) hours.    Yes Historical Provider, MD  carvedilol (COREG) 12.5 MG tablet Take 1 tablet (12.5 mg total) by mouth 2 (two) times  daily with a meal. 07/07/13  Yes Tora Kindred York, PA-C  docusate sodium (COLACE) 100 MG capsule Take 100 mg by mouth daily.    Yes Historical Provider, MD  fluticasone (FLONASE) 50 MCG/ACT nasal spray Place 1 spray into the nose daily.   Yes Historical Provider, MD  guaiFENesin (MUCINEX) 600 MG 12 hr tablet Take 600 mg by mouth daily.    Yes Historical Provider, MD  insulin aspart (NOVOLOG) 100 UNIT/ML injection Inject 5 Units into the skin 3 (  three) times daily with meals. If cbg is greater than 150 09/15/12  Yes Lesle Chris Black, NP  metolazone (ZAROXOLYN) 2.5 MG tablet On Mondays, can take on Fri if needed 07/16/13  Yes Aundria Rud, NP  omeprazole (PRILOSEC) 20 MG capsule Take 40 mg by mouth daily.    Yes Historical Provider, MD  ondansetron (ZOFRAN) 4 MG tablet Take 1 tablet (4 mg total) by mouth every 6 (six) hours as needed for nausea. 02/12/13  Yes Shanker Levora Dredge, MD  oxyCODONE (ROXICODONE) 5 MG immediate release tablet Take 1 tablet (5 mg total) by mouth every 6 (six) hours as needed for pain. 07/07/13  Yes Marianne L York, PA-C  polyethylene glycol (MIRALAX / GLYCOLAX) packet Take 17 g by mouth 2 (two) times daily.    Yes Historical Provider, MD  torsemide (DEMADEX) 20 MG tablet If weight is 180 or less take 60 mg Twice daily, if weight is over 180 take 80 mg Twice daily 09/22/13  Yes Aundria Rud, NP  traZODone (DESYREL) 50 MG tablet Take 50 mg by mouth at bedtime.   Yes Historical Provider, MD  warfarin (COUMADIN) 5 MG tablet Take 0.5 tablets (2.5 mg total) by mouth daily. Or as directed 10/11/13  Yes Dolores Patty, MD  nitroGLYCERIN (NITROSTAT) 0.4 MG SL tablet Place 0.4 mg under the tongue every 5 (five) minutes as needed for chest pain.    Historical Provider, MD     History   Social History  . Marital Status: Widowed    Spouse Name: N/A    Number of Children: N/A  . Years of Education: N/A   Occupational History  . Not on file.   Social History Main Topics  . Smoking  status: Never Smoker   . Smokeless tobacco: Never Used  . Alcohol Use: No  . Drug Use: No  . Sexual Activity: No   Other Topics Concern  . Not on file   Social History Narrative  . No narrative on file    Family Status  Relation Status Death Age  . Mother Deceased     burned up in  a fire.DM  . Father Deceased     bee sting  . Sister Alive     paralyzed after a fall  . Brother Deceased     GSW  . Sister Deceased     CAD  . Sister Deceased     CAD  . Sister Deceased     Complications of DM  . Sister Deceased     died during surgery  . Brother Deceased     alcohol  . Brother Deceased     kidney failure, infection  . Brother Deceased     collapsed lung  . Brother Deceased     colon cancer  . Brother Alive     ? colon cancer  . Brother Alive     DM and CAD   Family History  Problem Relation Age of Onset  . Colon cancer Brother      ROS:  Full 14 point review of systems complete and found to be negative unless listed above.  Physical Exam: Blood pressure 127/69, pulse 73, temperature 98.6 F (37 C), temperature source Oral, resp. rate 14, height 5\' 3"  (1.6 m), weight 190 lb (86.183 kg), SpO2 99.00%.  General: Well developed, well nourished, female in no acute distress Head: Eyes PERRLA, No xanthomas.   Normocephalic and atraumatic, oropharynx without edema or exudate. Dentition: poor Lungs: decreased  breath sounds bases. Heart: Heart irregular rate and rhythm with S1, S2; soft systolic murmur. pulses are 2+ all 4 extrem.   Neck: No carotid bruits. No lymphadenopathy.  JVD 12 cm. Abdomen: Bowel sounds present, abdomen distended but non-tender without masses or hernias noted. Msk:  No spine or cva tenderness. No weakness, no joint deformities or effusions. Extremities: No clubbing or cyanosis. 2+ edema.  Neuro: Alert and oriented X 3. No focal deficits noted. Psych:  Good affect, responds appropriately Skin: No rashes or lesions noted.  Labs:   Lab Results    Component Value Date   WBC 5.2 10/22/2013   HGB 11.3* 10/22/2013   HCT 33.6* 10/22/2013   MCV 85.1 10/22/2013   PLT 127* 10/22/2013    Recent Labs  10/22/13 1628  INR 2.31*    Recent Labs Lab 10/22/13 1628  NA 125*  K 2.9*  CL 80*  CO2 36*  BUN 101*  CREATININE 3.71*  CALCIUM 9.2  GLUCOSE 167*    Recent Labs  10/22/13 1643  TROPIPOC 0.01   Pro B Natriuretic peptide (BNP)  Date/Time Value Range Status  10/22/2013  4:28 PM 4942.0* 0 - 450 pg/mL Final  07/05/2013  2:48 PM 3388.0* 0 - 450 pg/mL Final    Echo: TEE  01/19/2013 Study Conclusions - Left ventricle: Systolic function was normal. The estimated ejection fraction was in the range of 55% to 60%. Wall motion was normal; there were no regional wall motion abnormalities. - Aortic valve: Valve mobility was restricted. - Mitral valve: Mildly calcified annulus. Mild regurgitation. - Left atrium: The atrium was moderately to severely dilated. There was a possible thrombus in the tip of the appendage. There was moderatecontinuous spontaneous echo contrast ("smoke") in the appendage. Emptying velocity was reduced. - Right ventricle: The cavity size was moderately dilated. Systolic function was moderately reduced. - Right atrium: The atrium was moderately to severely dilated. - Tricuspid valve: Moderate regurgitation.  ECG:  10/22/2013 Atrial flutter, variable AV block. Vent. rate 66 BPM PR interval * ms QRS duration 120 ms QT/QTc 504/528 ms P-R-T axes 63 -83 194  Radiology:  Dg Chest 2 View 10/22/2013   CLINICAL DATA:  Shortness of breath, weight gain  EXAM: CHEST - 2 VIEW  COMPARISON:  08/18/2013  FINDINGS: Stable moderate cardiomegaly. Progression of right pleural effusion, now moderate in size. There is a trace left pleural effusion. Mild central pulmonary vascular congestion. Degenerative changes in the mid and lower thoracic spine and bilateral shoulders. Atheromatous aorta.  IMPRESSION: 1. Progression  of moderate right and small left pleural effusions. 2. Cardiomegaly and pulmonary vascular congestion as before.   Electronically Signed   By: Oley Balm M.D.   On: 10/22/2013 17:51    ASSESSMENT AND PLAN:   The patient was seen today by Dr. Wyline Mood, the patient evaluated and the data reviewed.  Active Problems:   Acute on chronic diastolic CHF (congestive heart failure) - needs diuresis, will start with Lasix 80 mg IV TID, continue the metolazone. Follow weights plus strict I/O, daily BMETs. Agree with potassium supplementation, but would repeat KCL 40 meq in 6 hours to keep her from getting too low with diuresis. Recheck labs in am.   Signed: Theodore Demark, PA-C 10/22/2013 7:01 PM Beeper [phone]  Co-Sign MD  Attending Note Patient seen and discussed with PA Barrett. 79 yo female with history of diastolic heart failure, CKD stage IV not a candidate for dialysis admitted with acute on chronic diasotlic heart failure and  multiple electrolytes including hypnatermia and hypokalemia. She has a reported 9 pound weight gain based on her home scale, increased orthopnea, + JVD, increased pro-BNP and worsening pleural effusions on chest xray. She reports compliance with her home diuretics. Will give additional metolazone 2.5mg  and start lasix 80mg  tid for more aggressive diuresis. Her Cr has increased from prior check, it is unclear if this is progression of her chronic kidney disease or related to increased venous pressures and decreased renal perfusion gradients related to her diastolic heart failure. Follow renal function and electrolytes closely as we attempt to diurese her.    Dina Rich MD

## 2013-10-22 NOTE — Progress Notes (Signed)
ANTICOAGULATION CONSULT NOTE - Initial Consult  Pharmacy Consult for Coumadin Indication: atrial fibrillation  Allergies  Allergen Reactions  . Morphine Sulfate Other (See Comments)    REACTION: change in personality  . Remeron [Mirtazapine] Other (See Comments)    Altered mental status, lethargy  . Prescott Gum [Fish Allergy] Other (See Comments)    unknown  . Penicillins Rash    Tolerates Ancef.    Patient Measurements: Height: 5\' 3"  (160 cm) Weight: 189 lb 9.5 oz (86 kg) IBW/kg (Calculated) : 52.4  Vital Signs: Temp: 96.9 F (36.1 C) (12/19 2017) Temp src: Oral (12/19 2017) BP: 146/79 mmHg (12/19 2017) Pulse Rate: 64 (12/19 2017)  Labs:  Recent Labs  10/22/13 1628  HGB 11.3*  HCT 33.6*  PLT 127*  LABPROT 24.6*  INR 2.31*  CREATININE 3.71*    Estimated Creatinine Clearance: 11.7 ml/min (by C-G formula based on Cr of 3.71).   Medical History: Past Medical History  Diagnosis Date  . GERD (gastroesophageal reflux disease)   . Hiatal hernia   . HTN (hypertension)     all her life  . Atrial fibrillation     since 1996  . Depression   . Chronic diastolic heart failure 08/31/2012  . Tibia fracture     BILATERAL  . Respiratory failure     acute on chronic hx of  requiring BIPAP  . COPD (chronic obstructive pulmonary disease)   . Metabolic encephalopathy     hx of   . UTI (urinary tract infection)     hx of   . Pleural effusion     right sided now resolved   . CHF (congestive heart failure)     diastolic HF  . Complication of anesthesia     "hard to wake up" (03/11/2013)  . OSA on CPAP   . Type II diabetes mellitus 1980's?  . History of blood transfusion 08/2012    "related to leg OR" (03/11/2013)  . Arthritis     "left arm; lower back, hips, hands" (03/11/2013)  . Chronic lower back pain     "disc pops in and out" (03/11/2013)  . Gout   . Anxiety   . Fall 08/2012    "in nursing home" (03/11/2013)  . Anemia   . Chronic a-fib 03/12/2013  . CKD (chronic kidney  disease), stage III     GFR: 38  . Renal failure     acute on chronic   . Renal failure     acute on chronic with need for intermittent dialysis - hx of     Medications:  Prescriptions prior to admission  Medication Sig Dispense Refill  . acetaminophen (TYLENOL) 325 MG tablet Take 650 mg by mouth every 6 (six) hours as needed for pain.      Marland Kitchen allopurinol (ZYLOPRIM) 100 MG tablet Take 100 mg by mouth daily.      Marland Kitchen ALPRAZolam (XANAX) 0.25 MG tablet Take 0.125 mg by mouth every 8 (eight) hours as needed for anxiety.      Marland Kitchen amiodarone (PACERONE) 200 MG tablet Take 1 tablet (200 mg total) by mouth daily.  30 tablet  3  . busPIRone (BUSPAR) 5 MG tablet Take 5 mg by mouth every 12 (twelve) hours.       . carvedilol (COREG) 12.5 MG tablet Take 1 tablet (12.5 mg total) by mouth 2 (two) times daily with a meal.  60 tablet  3  . docusate sodium (COLACE) 100 MG capsule Take 100 mg by mouth  daily.       . fluticasone (FLONASE) 50 MCG/ACT nasal spray Place 1 spray into the nose daily.      Marland Kitchen guaiFENesin (MUCINEX) 600 MG 12 hr tablet Take 600 mg by mouth daily.       . insulin aspart (NOVOLOG) 100 UNIT/ML injection Inject 5 Units into the skin 3 (three) times daily with meals. If cbg is greater than 150      . metolazone (ZAROXOLYN) 2.5 MG tablet On Mondays, can take on Fri if needed      . omeprazole (PRILOSEC) 20 MG capsule Take 40 mg by mouth daily.       . ondansetron (ZOFRAN) 4 MG tablet Take 1 tablet (4 mg total) by mouth every 6 (six) hours as needed for nausea.  20 tablet    . oxyCODONE (ROXICODONE) 5 MG immediate release tablet Take 1 tablet (5 mg total) by mouth every 6 (six) hours as needed for pain.  30 tablet  0  . polyethylene glycol (MIRALAX / GLYCOLAX) packet Take 17 g by mouth 2 (two) times daily.       Marland Kitchen torsemide (DEMADEX) 20 MG tablet If weight is 180 or less take 60 mg Twice daily, if weight is over 180 take 80 mg Twice daily  240 tablet  3  . traZODone (DESYREL) 50 MG tablet Take 50  mg by mouth at bedtime.      Marland Kitchen warfarin (COUMADIN) 5 MG tablet Take 0.5 tablets (2.5 mg total) by mouth daily. Or as directed  30 tablet  3  . nitroGLYCERIN (NITROSTAT) 0.4 MG SL tablet Place 0.4 mg under the tongue every 5 (five) minutes as needed for chest pain.        Assessment: 79 year old female admitted with CHF exacerbation.  She is on chronic anticoagulation with Coumadin for atrial fibrillation and her INR is therapeutic.  She has not taken her Coumadin dose for today.  Goal of Therapy:  INR 2-3   Plan:  Coumadin 2.5mg  today Daily PT/INR monitoring  Estella Husk, Pharm.D., BCPS, AAHIVP Clinical Pharmacist Phone: [phone] or [phone] 10/22/2013, 8:22 PM

## 2013-10-22 NOTE — Telephone Encounter (Signed)
dauughter called with concens of increased weight. States her mothers weight is normally 180 today 191.2 and yesterday 192.8does have abdominal swelling and increased fatigue  Also requesting lab results from [facility] home visit

## 2013-10-22 NOTE — Telephone Encounter (Signed)
Spoke w/daughter, wt up slight SOB and abd swelling, per Dr Gala Romney give metolazone today and call back on Monday

## 2013-10-22 NOTE — ED Notes (Signed)
Pt reports that she is a patient of Dr. Kathlen Brunswick and has been very weak over the past week. Reports that she has also been retaining fluid. Denies any pain at this time. Reports her weight has been fluctuating.

## 2013-10-23 DIAGNOSIS — R627 Adult failure to thrive: Secondary | ICD-10-CM

## 2013-10-23 LAB — MRSA PCR SCREENING: MRSA by PCR: POSITIVE — AB

## 2013-10-23 LAB — GLUCOSE, CAPILLARY
Glucose-Capillary: 220 mg/dL — ABNORMAL HIGH (ref 70–99)
Glucose-Capillary: 164 mg/dL — ABNORMAL HIGH (ref 70–99)

## 2013-10-23 LAB — PROTIME-INR
INR: 1.87 — ABNORMAL HIGH (ref 0.00–1.49)
Prothrombin Time: 21 seconds — ABNORMAL HIGH (ref 11.6–15.2)

## 2013-10-23 LAB — BASIC METABOLIC PANEL
BUN: 100 mg/dL — ABNORMAL HIGH (ref 6–23)
CO2: 39 mEq/L — ABNORMAL HIGH (ref 19–32)
Calcium: 9.4 mg/dL (ref 8.4–10.5)
Creatinine, Ser: 3.33 mg/dL — ABNORMAL HIGH (ref 0.50–1.10)
GFR calc non Af Amer: 12 mL/min — ABNORMAL LOW (ref >90)
Glucose, Bld: 180 mg/dL — ABNORMAL HIGH (ref 70–99)
Sodium: 131 mEq/L — ABNORMAL LOW (ref 135–145)

## 2013-10-23 LAB — HEMOGLOBIN A1C: Hgb A1c MFr Bld: 8 % — ABNORMAL HIGH (ref <5.7)

## 2013-10-23 MED ORDER — CHLORHEXIDINE GLUCONATE CLOTH 2 % EX PADS
6.0000 | MEDICATED_PAD | Freq: Every day | CUTANEOUS | Status: DC
  Administered 2013-10-23 – 2013-10-25 (×3): 6 via TOPICAL

## 2013-10-23 MED ORDER — POTASSIUM CHLORIDE CRYS ER 20 MEQ PO TBCR
40.0000 meq | EXTENDED_RELEASE_TABLET | Freq: Every day | ORAL | Status: DC
  Administered 2013-10-23 – 2013-10-25 (×3): 40 meq via ORAL
  Filled 2013-10-23 (×4): qty 2

## 2013-10-23 MED ORDER — WARFARIN SODIUM 5 MG PO TABS
5.0000 mg | ORAL_TABLET | Freq: Once | ORAL | Status: AC
  Administered 2013-10-23: 5 mg via ORAL
  Filled 2013-10-23: qty 1

## 2013-10-23 MED ORDER — POTASSIUM CHLORIDE CRYS ER 20 MEQ PO TBCR
40.0000 meq | EXTENDED_RELEASE_TABLET | Freq: Once | ORAL | Status: AC
  Administered 2013-10-23: 40 meq via ORAL
  Filled 2013-10-23: qty 2

## 2013-10-23 MED ORDER — MUPIROCIN 2 % EX OINT
1.0000 "application " | TOPICAL_OINTMENT | Freq: Two times a day (BID) | CUTANEOUS | Status: DC
  Administered 2013-10-23 – 2013-10-25 (×6): 1 via NASAL
  Filled 2013-10-23: qty 22

## 2013-10-23 MED ORDER — METOLAZONE 2.5 MG PO TABS
2.5000 mg | ORAL_TABLET | Freq: Every day | ORAL | Status: DC
  Administered 2013-10-23 – 2013-10-25 (×3): 2.5 mg via ORAL
  Filled 2013-10-23 (×3): qty 1

## 2013-10-23 NOTE — Progress Notes (Signed)
ANTICOAGULATION CONSULT NOTE  Pharmacy Consult for Coumadin Indication: atrial fibrillation  Allergies  Allergen Reactions  . Morphine Sulfate Other (See Comments)    REACTION: change in personality  . Remeron [Mirtazapine] Other (See Comments)    Altered mental status, lethargy  . Prescott Gum [Fish Allergy] Other (See Comments)    unknown  . Penicillins Rash    Tolerates Ancef.    Labs:  Recent Labs  10/22/13 1628 10/23/13 0540  HGB 11.3*  --   HCT 33.6*  --   PLT 127*  --   LABPROT 24.6* 21.0*  INR 2.31* 1.87*  CREATININE 3.71* 3.33*    Estimated Creatinine Clearance: 12.9 ml/min (by C-G formula based on Cr of 3.33).   Assessment: 79 year old female admitted with CHF exacerbation.  She is on chronic anticoagulation with Coumadin for atrial fibrillation and her INR is slightly sub-therapeutic.    Goal of Therapy:  INR 2-3   Plan:  Coumadin 5 mg today Daily PT/INR monitoring  Thank you. Okey Regal, PharmD [phone]  10/23/2013, 8:32 AM

## 2013-10-23 NOTE — Progress Notes (Addendum)
[facility] PROGRESS NOTE  Gina Ashley WUJ:[id number] DOB: 20-Jul-1927 DOA: 10/22/2013 PCP: Gina Found, MD  Assessment/Plan: 79 y.o. female with PMH of a fib on AC/coumaidn, CHF/diastolic heart failure, HTN, DM type 2, COPD, OSA on CPAP, and CKD failure with baseline Cr 2.2 is admitted with acute on chronic CHF   1. Acute on chronic CHF; CXR: pleural effusion, congestion; echo (95621): LVEF 55%, dilated LA, RV, RA;  -cont diuresis metolazone, lasix per cardiology; daily weight, I/O; replace lytes;    2. A fib on coumadin; cont home regimen   3. DM HA1C-6.8 (01/2013); cont insulin; check a1c; close monitor   4. COPD; cont inhalers   5. CKD IV; per nephrology not a candidate for HD; cont close monitor on diuresis   6. HTN cont home regimen   Code Status: full Family Communication: d/w patient; called no answer; Ashley,Gina Daughter [phone] Gina Ashley [phone] (indicate person spoken with, relationship, and if by phone, the number)tyr later Disposition Plan: home when clinically improves; PT/OT eval    Consultants:  Cardiology   Procedures:  Non e  Antibiotics:  None  (indicate start date, and stop date if known)  HPI/Subjective: alert  Objective: Filed Vitals:   10/23/13 0800  BP: 124/77  Pulse:   Temp: 97.8 F (36.6 C)  Resp: 13    Intake/Output Summary (Last 24 hours) at 10/23/13 1000 Last data filed at 10/23/13 0400  Gross per 24 hour  Intake    103 ml  Output   1750 ml  Net  -1647 ml   Filed Weights   10/22/13 1459 10/22/13 2017 10/23/13 0500  Weight: 86.183 kg (190 lb) 86 kg (189 lb 9.5 oz) 83.8 kg (184 lb 11.9 oz)    Exam:   General:  alert  Cardiovascular: s1,s2 rrr  Respiratory: LL crackles   Abdomen: soft, nt, nd   Musculoskeletal: mild edema    Data Reviewed: Basic Metabolic Panel:  Recent Labs Lab 10/22/13 1628 10/22/13 2114 10/23/13 0540  NA 125*  --  131*  K 2.9*  --  3.5  CL 80*  --  83*   CO2 36*  --  39*  GLUCOSE 167*  --  180*  BUN 101*  --  100*  CREATININE 3.71*  --  3.33*  CALCIUM 9.2  --  9.4  MG 2.3 2.3  --    Liver Function Tests: No results Ashley for this basename: AST, ALT, ALKPHOS, BILITOT, PROT, ALBUMIN,  in the last 168 hours No results Ashley for this basename: LIPASE, AMYLASE,  in the last 168 hours No results Ashley for this basename: AMMONIA,  in the last 168 hours CBC:  Recent Labs Lab 10/22/13 1628  WBC 5.2  HGB 11.3*  HCT 33.6*  MCV 85.1  PLT 127*   Cardiac Enzymes: No results Ashley for this basename: CKTOTAL, CKMB, CKMBINDEX, TROPONINI,  in the last 168 hours BNP (last 3 results)  Recent Labs  06/13/13 1714 07/05/13 1448 10/22/13 1628  PROBNP 3655.0* 3388.0* 4942.0*   CBG: No results Ashley for this basename: GLUCAP,  in the last 168 hours  Recent Results (from the past 240 hour(s))  MRSA PCR SCREENING     Status: Abnormal   Collection Time    10/22/13  8:13 PM      Result Value Range Status   MRSA by PCR POSITIVE (*) NEGATIVE Final   Comment:            The GeneXpert MRSA Assay (FDA  approved for NASAL specimens     only), is one component of a     comprehensive MRSA colonization     surveillance program. It is not     intended to diagnose MRSA     infection nor to guide or     monitor treatment for     MRSA infections.     RESULT CALLED TO, READ BACK BY AND VERIFIED WITHLouanne Ashley [phone] Gina Ashley     Studies: Dg Chest 2 View  10/22/2013   CLINICAL DATA:  Shortness of breath, weight gain  EXAM: CHEST - 2 VIEW  COMPARISON:  08/18/2013  FINDINGS: Stable moderate cardiomegaly. Progression of right pleural effusion, now moderate in size. There is a trace left pleural effusion. Mild central pulmonary vascular congestion. Degenerative changes in the mid and lower thoracic spine and bilateral shoulders. Atheromatous aorta.  IMPRESSION: 1. Progression of moderate right and small left pleural effusions. 2.  Cardiomegaly and pulmonary vascular congestion as before.   Electronically Signed   By: Gina Ashley M.D.   On: 10/22/2013 17:51    Scheduled Meds: . allopurinol  100 mg Oral Daily  . amiodarone  200 mg Oral Daily  . busPIRone  5 mg Oral Q12H  . carvedilol  12.5 mg Oral BID WC  . Chlorhexidine Gluconate Cloth  6 each Topical Q0600  . docusate sodium  100 mg Oral Daily  . fluticasone  1 spray Each Nare Daily  . furosemide  80 mg Intravenous TID  . guaiFENesin  600 mg Oral Daily  . insulin aspart  0-15 Units Subcutaneous TID WC  . insulin aspart  5 Units Subcutaneous TID WC  . metolazone  2.5 mg Oral Daily  . mupirocin ointment  1 application Nasal BID  . pantoprazole  40 mg Oral Daily  . polyethylene glycol  17 g Oral BID  . potassium chloride  40 mEq Oral Daily  . sodium chloride  3 mL Intravenous Q12H  . traZODone  50 mg Oral QHS  . warfarin  5 mg Oral ONCE-1800  . Warfarin - Pharmacist Dosing Inpatient   Does not apply q1800   Continuous Infusions:   Principal Problem:   CHF exacerbation Active Problems:   DM   HYPERTENSION   GERD   Anemia due to chronic illness   CKD (chronic kidney disease)   Chronic diastolic heart failure   Obesity (BMI 30-39.9)   Depression   Shortness of breath   Renal failure (ARF), acute on chronic   Chronic a-fib   CHF (congestive heart failure)    Time spent: >35 minutes     Gina Ashley  [facility] Pager [phone]. If 7PM-7AM, please contact night-coverage at [email or web address], password [facility] 10/23/2013, 10:00 AM  LOS: 1 day

## 2013-10-23 NOTE — Progress Notes (Addendum)
Patient Name: Gina Ashley Date of Encounter: 10/23/2013  Principal Problem:   CHF exacerbation Active Problems:   DM   HYPERTENSION   GERD   Anemia due to chronic illness   CKD (chronic kidney disease)   Chronic diastolic heart failure   Obesity (BMI 30-39.9)   Depression   Shortness of breath   Renal failure (ARF), acute on chronic   Chronic a-fib   CHF (congestive heart failure)   Length of Stay: 1  SUBJECTIVE  The patient feels significantly better today, she was able to sleep at 40 degrees. 24H I/O - 1.6 Liters, weight -6 pounds. BP stable.  CURRENT MEDS . allopurinol  100 mg Oral Daily  . amiodarone  200 mg Oral Daily  . busPIRone  5 mg Oral Q12H  . carvedilol  12.5 mg Oral BID WC  . Chlorhexidine Gluconate Cloth  6 each Topical Q0600  . docusate sodium  100 mg Oral Daily  . fluticasone  1 spray Each Nare Daily  . furosemide  80 mg Intravenous TID  . guaiFENesin  600 mg Oral Daily  . insulin aspart  0-15 Units Subcutaneous TID WC  . insulin aspart  5 Units Subcutaneous TID WC  . mupirocin ointment  1 application Nasal BID  . pantoprazole  40 mg Oral Daily  . polyethylene glycol  17 g Oral BID  . potassium chloride  40 mEq Oral Daily  . sodium chloride  3 mL Intravenous Q12H  . traZODone  50 mg Oral QHS  . warfarin  5 mg Oral ONCE-1800  . Warfarin - Pharmacist Dosing Inpatient   Does not apply q1800    OBJECTIVE  Filed Vitals:   10/23/13 0400 10/23/13 0500 10/23/13 0600 10/23/13 0800  BP: 100/60  119/64 124/77  Pulse:      Temp: 98 F (36.7 C)   97.8 F (36.6 C)  TempSrc: Oral   Oral  Resp: 16  14 13   Height:      Weight:  184 lb 11.9 oz (83.8 kg)    SpO2: 98%  97% 99%    Intake/Output Summary (Last 24 hours) at 10/23/13 0848 Last data filed at 10/23/13 0400  Gross per 24 hour  Intake    103 ml  Output   1750 ml  Net  -1647 ml   Filed Weights   10/22/13 1459 10/22/13 2017 10/23/13 0500  Weight: 190 lb (86.183 kg) 189 lb 9.5 oz (86  kg) 184 lb 11.9 oz (83.8 kg)    PHYSICAL EXAM  General: Pleasant, NAD. Neuro: Alert and oriented X 3. Moves all extremities spontaneously. Psych: Normal affect. HEENT:  Normal  Neck: Supple without bruits, JVD + 10 cm. Lungs:  Resp regular and unlabored, rales and crakles up to mid lungs, decreased BS at the right base. Heart: RRR no s3, s4, or murmurs. Abdomen: Soft, non-tender, non-distended, BS + x 4.  Extremities: No clubbing, cyanosis, mild edema B/L. DP/PT/Radials 2+ and equal bilaterally.  Accessory Clinical Findings  CBC  Recent Labs  10/22/13 1628  WBC 5.2  HGB 11.3*  HCT 33.6*  MCV 85.1  PLT 127*   Basic Metabolic Panel  Recent Labs  10/22/13 1628 10/22/13 2114 10/23/13 0540  NA 125*  --  131*  K 2.9*  --  3.5  CL 80*  --  83*  CO2 36*  --  39*  GLUCOSE 167*  --  180*  BUN 101*  --  100*  CREATININE 3.71*  --  3.33*  CALCIUM 9.2  --  9.4  MG 2.3 2.3  --     Dg Chest 2 View  10/22/2013   CLINICAL DATA:  Shortness of breath, weight gain  EXAM: CHEST - 2 VIEW  COMPARISON:  08/18/2013  FINDINGS: Stable moderate cardiomegaly. Progression of right pleural effusion, now moderate in size. There is a trace left pleural effusion. Mild central pulmonary vascular congestion. Degenerative changes in the mid and lower thoracic spine and bilateral shoulders. Atheromatous aorta.  IMPRESSION: 1. Progression of moderate right and small left pleural effusions. 2. Cardiomegaly and pulmonary vascular congestion as before.      TELE; A--fib rate controlled    ASSESSMENT AND PLAN  79 yo female admitted with acute on chronic diastolic heart failure, 6 admissions in the last 6 months, CKD stage IV not a candidate for dialysis , multiple electrolytes including hypnatermia and hypokalemia.  She has a reported 9 pound weight gain based on her home scale, increased orthopnea, + JVD, increased pro-BNP and worsening pleural effusions on chest xray. She reports compliance with her  home diuretics.  Good diuresis and symptoms response in the first 24 hours .  Will will continue aggressive diuresis with metolazone 2.5mg  and lasix 80mg  tid. Her Crea has increased from prior check but has slightly improved since yesterday, it is unclear if this is progression of her chronic kidney disease or related to increased venous pressures and decreased renal perfusion gradients related to her diastolic heart failure. We will continue replacing potassium.  Signed, Tobias Alexander, H MD, [facility] 10/23/2013

## 2013-10-24 DIAGNOSIS — I4892 Unspecified atrial flutter: Secondary | ICD-10-CM

## 2013-10-24 LAB — GLUCOSE, CAPILLARY
Glucose-Capillary: 164 mg/dL — ABNORMAL HIGH (ref 70–99)
Glucose-Capillary: 144 mg/dL — ABNORMAL HIGH (ref 70–99)

## 2013-10-24 LAB — BASIC METABOLIC PANEL
BUN: 97 mg/dL — ABNORMAL HIGH (ref 6–23)
BUN: 94 mg/dL — ABNORMAL HIGH (ref 6–23)
CO2: 41 mEq/L (ref 19–32)
CO2: 41 mEq/L (ref 19–32)
Calcium: 9.4 mg/dL (ref 8.4–10.5)
Calcium: 9.5 mg/dL (ref 8.4–10.5)
Chloride: 84 mEq/L — ABNORMAL LOW (ref 96–112)
Chloride: 83 mEq/L — ABNORMAL LOW (ref 96–112)
Creatinine, Ser: 2.92 mg/dL — ABNORMAL HIGH (ref 0.50–1.10)
GFR calc Af Amer: 16 mL/min — ABNORMAL LOW (ref >90)
GFR calc non Af Amer: 14 mL/min — ABNORMAL LOW (ref >90)
Glucose, Bld: 164 mg/dL — ABNORMAL HIGH (ref 70–99)
Glucose, Bld: 206 mg/dL — ABNORMAL HIGH (ref 70–99)
Potassium: 3.5 mEq/L (ref 3.5–5.1)
Sodium: 133 mEq/L — ABNORMAL LOW (ref 135–145)
Sodium: 133 mEq/L — ABNORMAL LOW (ref 135–145)

## 2013-10-24 LAB — PROTIME-INR: INR: 2.29 — ABNORMAL HIGH (ref 0.00–1.49)

## 2013-10-24 MED ORDER — INSULIN GLARGINE 100 UNIT/ML ~~LOC~~ SOLN
5.0000 [IU] | Freq: Every day | SUBCUTANEOUS | Status: DC
  Administered 2013-10-24 – 2013-10-25 (×2): 5 [IU] via SUBCUTANEOUS
  Filled 2013-10-24 (×2): qty 0.05

## 2013-10-24 MED ORDER — WARFARIN SODIUM 2.5 MG PO TABS
2.5000 mg | ORAL_TABLET | Freq: Once | ORAL | Status: AC
  Administered 2013-10-24: 2.5 mg via ORAL
  Filled 2013-10-24: qty 1

## 2013-10-24 MED ORDER — POTASSIUM CHLORIDE CRYS ER 20 MEQ PO TBCR
60.0000 meq | EXTENDED_RELEASE_TABLET | Freq: Once | ORAL | Status: AC
  Administered 2013-10-24: 60 meq via ORAL
  Filled 2013-10-24: qty 3

## 2013-10-24 MED ORDER — POTASSIUM CHLORIDE CRYS ER 20 MEQ PO TBCR
40.0000 meq | EXTENDED_RELEASE_TABLET | Freq: Once | ORAL | Status: AC
  Administered 2013-10-24: 40 meq via ORAL

## 2013-10-24 MED ORDER — SODIUM CHLORIDE 0.9 % IV BOLUS (SEPSIS): 250.0000 mL | INTRAVENOUS | Status: DC | PRN

## 2013-10-24 NOTE — Progress Notes (Signed)
Lab called critical CO2 41 up from 39 yesterday. Will continue to monitor. Karena Addison T

## 2013-10-24 NOTE — Progress Notes (Addendum)
[facility] PROGRESS NOTE  Gina Ashley ZOX:[id number] DOB: 11/23/1926 DOA: 10/22/2013 PCP: Allean Found, MD  Assessment/Plan: 79 y.o. female with PMH of a fib on AC/coumaidn, CHF/diastolic heart failure, HTN, DM type 2, COPD, OSA on CPAP, and CKD failure with baseline Cr 2.2 is admitted with acute on chronic CHF   1. Acute on chronic CHF; CXR: pleural effusion, congestion; echo (81191): LVEF 55%, dilated LA, RV, RA;  -improving; cont diuresis metolazone, lasix per cardiology; daily weight, I/O 2.5L; replace lytes;    2. A fib on coumadin; cont home regimen   3. DM HA1C-6.8 (01/2013); a1c-8.0; start lantus +ISS, hold meal time scheduled insulin due to hypoglycemia; titrate lantus; ; close monitor   4. COPD; cont inhalers   5. CKD IV; per nephrology not a candidate for HD; cont close monitor on diuresis   6. HTN cont home regimen    D/w patient, updated her daughter; patient is DNR Code Status: full Family Communication: d/w patient; called updated Hall,Debora Daughter [phone]  Disposition Plan: home when clinically improves; PT/OT eval    Consultants:  Cardiology   Procedures:  Non e  Antibiotics:  None  (indicate start date, and stop date if known)  HPI/Subjective: alert  Objective: Filed Vitals:   10/24/13 0316  BP: 126/71  Pulse:   Temp: 98.2 F (36.8 C)  Resp: 13    Intake/Output Summary (Last 24 hours) at 10/24/13 0801 Last data filed at 10/24/13 0600  Gross per 24 hour  Intake   1060 ml  Output   1950 ml  Net   -890 ml   Filed Weights   10/22/13 2017 10/23/13 0500 10/24/13 0315  Weight: 86 kg (189 lb 9.5 oz) 83.8 kg (184 lb 11.9 oz) 82.8 kg (182 lb 8.7 oz)    Exam:   General:  alert  Cardiovascular: s1,s2 rrr  Respiratory: LL crackles   Abdomen: soft, nt, nd   Musculoskeletal: mild edema    Data Reviewed: Basic Metabolic Panel:  Recent Labs Lab 10/22/13 1628 10/22/13 2114 10/23/13 0540 10/24/13 0515  NA  125*  --  131* 133*  K 2.9*  --  3.5 3.2*  CL 80*  --  83* 84*  CO2 36*  --  39* 41*  GLUCOSE 167*  --  180* 164*  BUN 101*  --  100* 97*  CREATININE 3.71*  --  3.33* 3.20*  CALCIUM 9.2  --  9.4 9.4  MG 2.3 2.3  --   --    Liver Function Tests: No results found for this basename: AST, ALT, ALKPHOS, BILITOT, PROT, ALBUMIN,  in the last 168 hours No results found for this basename: LIPASE, AMYLASE,  in the last 168 hours No results found for this basename: AMMONIA,  in the last 168 hours CBC:  Recent Labs Lab 10/22/13 1628  WBC 5.2  HGB 11.3*  HCT 33.6*  MCV 85.1  PLT 127*   Cardiac Enzymes: No results found for this basename: CKTOTAL, CKMB, CKMBINDEX, TROPONINI,  in the last 168 hours BNP (last 3 results)  Recent Labs  06/13/13 1714 07/05/13 1448 10/22/13 1628  PROBNP 3655.0* 3388.0* 4942.0*   CBG:  Recent Labs Lab 10/22/13 2127 10/23/13 0807 10/23/13 1137 10/23/13 1621  GLUCAP 220* 164* 237* 69*    Recent Results (from the past 240 hour(s))  MRSA PCR SCREENING     Status: Abnormal   Collection Time    10/22/13  8:13 PM      Result Value Range Status  MRSA by PCR POSITIVE (*) NEGATIVE Final   Comment:            The GeneXpert MRSA Assay (FDA     approved for NASAL specimens     only), is one component of a     comprehensive MRSA colonization     surveillance program. It is not     intended to diagnose MRSA     infection nor to guide or     monitor treatment for     MRSA infections.     RESULT CALLED TO, READ BACK BY AND VERIFIED WITHLouanne Skye [phone] WILDERK     Studies: Dg Chest 2 View  10/22/2013   CLINICAL DATA:  Shortness of breath, weight gain  EXAM: CHEST - 2 VIEW  COMPARISON:  08/18/2013  FINDINGS: Stable moderate cardiomegaly. Progression of right pleural effusion, now moderate in size. There is a trace left pleural effusion. Mild central pulmonary vascular congestion. Degenerative changes in the mid and lower thoracic spine  and bilateral shoulders. Atheromatous aorta.  IMPRESSION: 1. Progression of moderate right and small left pleural effusions. 2. Cardiomegaly and pulmonary vascular congestion as before.   Electronically Signed   By: Oley Balm M.D.   On: 10/22/2013 17:51    Scheduled Meds: . allopurinol  100 mg Oral Daily  . amiodarone  200 mg Oral Daily  . busPIRone  5 mg Oral Q12H  . carvedilol  12.5 mg Oral BID WC  . Chlorhexidine Gluconate Cloth  6 each Topical Q0600  . docusate sodium  100 mg Oral Daily  . fluticasone  1 spray Each Nare Daily  . furosemide  80 mg Intravenous TID  . guaiFENesin  600 mg Oral Daily  . insulin aspart  0-15 Units Subcutaneous TID WC  . insulin aspart  5 Units Subcutaneous TID WC  . metolazone  2.5 mg Oral Daily  . mupirocin ointment  1 application Nasal BID  . pantoprazole  40 mg Oral Daily  . polyethylene glycol  17 g Oral BID  . potassium chloride  40 mEq Oral Daily  . sodium chloride  3 mL Intravenous Q12H  . traZODone  50 mg Oral QHS  . Warfarin - Pharmacist Dosing Inpatient   Does not apply q1800   Continuous Infusions:   Principal Problem:   CHF exacerbation Active Problems:   DM   HYPERTENSION   GERD   Anemia due to chronic illness   CKD (chronic kidney disease)   Chronic diastolic heart failure   Obesity (BMI 30-39.9)   Depression   Shortness of breath   Renal failure (ARF), acute on chronic   Chronic a-fib   CHF (congestive heart failure)    Time spent: >35 minutes     Esperanza Sheets  [facility] Pager [phone]. If 7PM-7AM, please contact night-coverage at [email or web address], password [facility] 10/24/2013, 8:01 AM  LOS: 2 days

## 2013-10-24 NOTE — Evaluation (Signed)
Physical Therapy Evaluation Patient Details Name: Gina Ashley MRN: [id number] DOB: April 13, 1927 Today's Date: 10/24/2013 Time: 4098-1191 PT Time Calculation (min): 36 min  PT Assessment / Plan / Recommendation History of Present Illness  Pt adm with SOB and weight gain; acute on chronic CHF  Clinical Impression  Pt admitted with Above. Pt currently with functional limitations due to the deficits listed below (see PT Problem List).  Pt will benefit from skilled PT to increase their independence and safety with mobility to allow discharge to the venue listed below.       PT Assessment  Patient needs continued PT services    Follow Up Recommendations  Home health PT;Supervision/Assistance - 24 hour (due to decr cognition/safety)    Does the patient have the potential to tolerate intense rehabilitation      Barriers to Discharge        Equipment Recommendations  None recommended by PT    Recommendations for Other Services OT consult   Frequency Min 3X/week    Precautions / Restrictions Precautions Precautions: Fall Required Braces or Orthoses: Other Brace/Splint Other Brace/Splint: Lt double-upright AFO   Pertinent Vitals/Pain Sitting on arrival LUE BP 78/44 (54) Pt reporting overwhelming fatigue                            RUE      76/38 (50) Performed seated UE & LE exercise with attempt to incr BP RUE sitting 85/46 (58) after pivoting to bed 84/55 (63)      Mobility  Bed Mobility Bed Mobility: Sit to Supine;Scooting to HOB Sit to Supine: 4: Min guard;HOB flat Scooting to HOB: 5: Set up;With rail Transfers Transfers: Sit to Stand;Stand to Sit Sit to Stand: 4: Min assist;From bed Stand to Sit: 4: Min assist Details for Transfer Assistance: pt dizzy due to hypotension Ambulation/Gait Ambulation/Gait Assistance: 4: Min assist Ambulation Distance (Feet): 2 Feet Assistive device: Rolling walker Ambulation/Gait Assistance Details: steady assist due to dizziness     Exercises General Exercises - Upper Extremity Shoulder Flexion: AROM;Both;10 reps;Seated (x 2 sets) Elbow Flexion: AROM;Both;10 reps;Seated Digit Composite Flexion: AROM;Both;10 reps;Seated Composite Extension: AROM;Both;10 reps;Seated General Exercises - Lower Extremity Ankle Circles/Pumps: AROM;Right;10 reps;Seated Long Arc Quad: AROM;Both;10 reps;Seated;Strengthening (x 2 sets; with resistance) Hip Flexion/Marching: AROM;Both;10 reps;Seated Other Exercises Other Exercises: Seated exercises done to attempt to incr pt's BP prior to stand-pivot back to bed   PT Diagnosis: Difficulty walking  PT Problem List: Decreased strength;Decreased activity tolerance;Decreased balance;Decreased mobility;Decreased knowledge of use of DME;Cardiopulmonary status limiting activity PT Treatment Interventions: DME instruction;Gait training;Functional mobility training;Therapeutic activities;Therapeutic exercise;Patient/family education;Cognitive remediation     PT Goals(Current goals can be found in the care plan section) Acute Rehab PT Goals Patient Stated Goal: get ready to go home PT Goal Formulation: With patient Time For Goal Achievement: 10/31/13 Potential to Achieve Goals: Good  Visit Information  Last PT Received On: 10/24/13 Assistance Needed: +1 History of Present Illness: Pt adm with SOB and weight gain; acute on chronic CHF       Prior Functioning  Home Living Family/patient expects to be discharged to:: Private residence Living Arrangements: Children Available Help at Discharge: Family;Available 24 hours/day Type of Home: House Home Access: Level entry Home Layout: One level Home Equipment: Walker - 2 wheels;Bedside commode;Wheelchair - manual;Hospital bed Prior Function Level of Independence: Needs assistance Gait / Transfers Assistance Needed: independent to modified I ADL's / Homemaking Assistance Needed: assist Communication Communication: No difficulties Dominant  Hand:  Right    Cognition  Cognition Arousal/Alertness: Awake/alert Behavior During Therapy: WFL for tasks assessed/performed Overall Cognitive Status: Within Functional Limits for tasks assessed    Extremity/Trunk Assessment Upper Extremity Assessment Upper Extremity Assessment: Defer to OT evaluation Lower Extremity Assessment Lower Extremity Assessment: LLE deficits/detail LLE Deficits / Details: no AROM lt ankle (wears AFO); knee extension 4/5, flexion 4+/5 Cervical / Trunk Assessment Cervical / Trunk Assessment: Kyphotic   Balance    End of Session PT - End of Session Equipment Utilized During Treatment: Gait belt Activity Tolerance: Treatment limited secondary to medical complications (Comment) (hypotension) Patient left: in bed;with call bell/phone within reach Nurse Communication: Mobility status;Other (comment) (low BP)  GP     Cherish Runde 10/24/2013, 3:48 PM Pager [phone]

## 2013-10-24 NOTE — Progress Notes (Signed)
ANTICOAGULATION CONSULT NOTE  Pharmacy Consult for Coumadin Indication: atrial fibrillation  Allergies  Allergen Reactions  . Morphine Sulfate Other (See Comments)    REACTION: change in personality  . Remeron [Mirtazapine] Other (See Comments)    Altered mental status, lethargy  . [place] Gum [Fish Allergy] Other (See Comments)    unknown  . Penicillins Rash    Tolerates Ancef.    Labs:  Recent Labs  10/22/13 1628 10/23/13 0540 10/24/13 0515  HGB 11.3*  --   --   HCT 33.6*  --   --   PLT 127*  --   --   LABPROT 24.6* 21.0* 24.5*  INR 2.31* 1.87* 2.29*  CREATININE 3.71* 3.33* 3.20*    Estimated Creatinine Clearance: 13.3 ml/min (by C-G formula based on Cr of 3.2).   Assessment: 79 year old female admitted with a CHF exacerbation.  She is on chronic anticoagulation with Coumadin for atrial fibrillation and her INR is therapeutic today.    Goal of Therapy:  INR 2-3   Plan:  Coumadin 2.5 mg today Daily PT/INR monitoring  Thank you. Okey Regal, PharmD [phone]  10/24/2013, 8:57 AM

## 2013-10-24 NOTE — Progress Notes (Addendum)
Patient Name: Gina Ashley Date of Encounter: 10/24/2013  Principal Problem:   CHF exacerbation Active Problems:   DM   HYPERTENSION   GERD   Anemia due to chronic illness   CKD (chronic kidney disease)   Chronic diastolic heart failure   Obesity (BMI 30-39.9)   Depression   Shortness of breath   Renal failure (ARF), acute on chronic   Chronic a-fib   CHF (congestive heart failure)   Length of Stay: 2  SUBJECTIVE  The patient feels significantly better today, she was able to sleep.  24H I/O - 3.1 Liters, weight -7 pounds. BP stable.  CURRENT MEDS . allopurinol  100 mg Oral Daily  . amiodarone  200 mg Oral Daily  . busPIRone  5 mg Oral Q12H  . carvedilol  12.5 mg Oral BID WC  . Chlorhexidine Gluconate Cloth  6 each Topical Q0600  . docusate sodium  100 mg Oral Daily  . fluticasone  1 spray Each Nare Daily  . furosemide  80 mg Intravenous TID  . guaiFENesin  600 mg Oral Daily  . insulin aspart  0-15 Units Subcutaneous TID WC  . insulin glargine  5 Units Subcutaneous Daily  . metolazone  2.5 mg Oral Daily  . mupirocin ointment  1 application Nasal BID  . pantoprazole  40 mg Oral Daily  . polyethylene glycol  17 g Oral BID  . potassium chloride  40 mEq Oral Daily  . sodium chloride  3 mL Intravenous Q12H  . traZODone  50 mg Oral QHS  . warfarin  2.5 mg Oral ONCE-1800  . Warfarin - Pharmacist Dosing Inpatient   Does not apply q1800    OBJECTIVE  Filed Vitals:   10/23/13 2336 10/24/13 0315 10/24/13 0316 10/24/13 0810  BP: 131/76  126/71 110/75  Pulse:    87  Temp: 98 F (36.7 C)  98.2 F (36.8 C) 97.2 F (36.2 C)  TempSrc: Oral  Oral Axillary  Resp: 17  13   Height:      Weight:  182 lb 8.7 oz (82.8 kg)    SpO2: 96%  95% 97%    Intake/Output Summary (Last 24 hours) at 10/24/13 0950 Last data filed at 10/24/13 0900  Gross per 24 hour  Intake   1060 ml  Output   2400 ml  Net  -1340 ml   Filed Weights   10/22/13 2017 10/23/13 0500 10/24/13 0315   Weight: 189 lb 9.5 oz (86 kg) 184 lb 11.9 oz (83.8 kg) 182 lb 8.7 oz (82.8 kg)    PHYSICAL EXAM  General: Pleasant, NAD. Neuro: Alert and oriented X 3. Moves all extremities spontaneously. Psych: Normal affect. HEENT:  Normal  Neck: Supple without bruits, JVD + 10 cm. Lungs:  Resp regular and unlabored, rales and crakles up to mid lungs, decreased BS at the right base. Heart: RRR no s3, s4, or murmurs. Abdomen: Soft, non-tender, non-distended, BS + x 4.  Extremities: No clubbing, cyanosis, mild edema B/L. DP/PT/Radials 2+ and equal bilaterally.  Accessory Clinical Findings  CBC  Recent Labs  10/22/13 1628  WBC 5.2  HGB 11.3*  HCT 33.6*  MCV 85.1  PLT 127*   Basic Metabolic Panel  Recent Labs  10/22/13 1628 10/22/13 2114 10/23/13 0540 10/24/13 0515  NA 125*  --  131* 133*  K 2.9*  --  3.5 3.2*  CL 80*  --  83* 84*  CO2 36*  --  39* 41*  GLUCOSE 167*  --  180* 164*  BUN 101*  --  100* 97*  CREATININE 3.71*  --  3.33* 3.20*  CALCIUM 9.2  --  9.4 9.4  MG 2.3 2.3  --   --     Dg Chest 2 View  10/22/2013   CLINICAL DATA:  Shortness of breath, weight gain  EXAM: CHEST - 2 VIEW  COMPARISON:  08/18/2013  FINDINGS: Stable moderate cardiomegaly. Progression of right pleural effusion, now moderate in size. There is a trace left pleural effusion. Mild central pulmonary vascular congestion. Degenerative changes in the mid and lower thoracic spine and bilateral shoulders. Atheromatous aorta.  IMPRESSION: 1. Progression of moderate right and small left pleural effusions. 2. Cardiomegaly and pulmonary vascular congestion as before.     TELE; A flutter with variable block, HR 70-100    ASSESSMENT AND PLAN  79 yo female   1. Acute on chronic diastolic heart failure 6 admissions in the last 6 months, CKD stage IV not a candidate for dialysis , multiple electrolytes including hypnatermia and hypokalemia. She has a reported 9 pound weight gain based on her home scale,  increased orthopnea, + JVD, increased pro-BNP and worsening pleural effusions on chest xray. She reports compliance with her home diuretics.  Good diuresis and symptoms response in the first 48 hours . -3L an - 7 lbs. Crea is improving  - We will continue iv diuretics with metolazone 2.5mg  and lasix 80mg  tid - We will replace potassium  2. H/o chronic a-fib, now in a-flutter, on home amiodarone (?? Use if chronic a-fib), we will schedule her for DCCV for tomorrow. She has been therapeutic on Warfarin.   Signed, Tobias Alexander, H MD, [facility] 10/24/2013

## 2013-10-25 DIAGNOSIS — I4892 Unspecified atrial flutter: Secondary | ICD-10-CM

## 2013-10-25 LAB — BASIC METABOLIC PANEL
CO2: 39 mEq/L — ABNORMAL HIGH (ref 19–32)
Calcium: 9.7 mg/dL (ref 8.4–10.5)
Creatinine, Ser: 3.03 mg/dL — ABNORMAL HIGH (ref 0.50–1.10)
GFR calc non Af Amer: 13 mL/min — ABNORMAL LOW (ref >90)
Glucose, Bld: 129 mg/dL — ABNORMAL HIGH (ref 70–99)
Potassium: 4.6 mEq/L (ref 3.5–5.1)
Sodium: 132 mEq/L — ABNORMAL LOW (ref 135–145)

## 2013-10-25 LAB — PROTIME-INR
INR: 2.31 — ABNORMAL HIGH (ref 0.00–1.49)
Prothrombin Time: 24.6 seconds — ABNORMAL HIGH (ref 11.6–15.2)

## 2013-10-25 LAB — GLUCOSE, CAPILLARY: Glucose-Capillary: 129 mg/dL — ABNORMAL HIGH (ref 70–99)

## 2013-10-25 MED ORDER — OXYCODONE HCL 5 MG PO TABS: 5.0000 mg | ORAL_TABLET | Freq: Four times a day (QID) | ORAL | Status: DC | PRN

## 2013-10-25 MED ORDER — AMIODARONE HCL 200 MG PO TABS: 200.0000 mg | ORAL_TABLET | Freq: Every day | ORAL | Status: AC

## 2013-10-25 MED ORDER — INSULIN GLARGINE 100 UNIT/ML ~~LOC~~ SOLN: 5.0000 [IU] | Freq: Every day | SUBCUTANEOUS | Status: AC

## 2013-10-25 MED ORDER — ONDANSETRON HCL 4 MG PO TABS: 4.0000 mg | ORAL_TABLET | Freq: Four times a day (QID) | ORAL | Status: AC | PRN

## 2013-10-25 MED ORDER — MUPIROCIN 2 % EX OINT: 1.0000 "application " | TOPICAL_OINTMENT | Freq: Two times a day (BID) | CUTANEOUS | Status: DC

## 2013-10-25 MED ORDER — INSULIN ASPART 100 UNIT/ML ~~LOC~~ SOLN: 0.0000 [IU] | Freq: Three times a day (TID) | SUBCUTANEOUS | Status: DC

## 2013-10-25 MED ORDER — BUSPIRONE HCL 5 MG PO TABS: 5.0000 mg | ORAL_TABLET | Freq: Two times a day (BID) | ORAL | Status: AC

## 2013-10-25 MED ORDER — ALPRAZOLAM 0.25 MG PO TABS: 0.1250 mg | ORAL_TABLET | Freq: Three times a day (TID) | ORAL | Status: DC | PRN

## 2013-10-25 MED ORDER — TORSEMIDE 20 MG PO TABS: ORAL_TABLET | ORAL | Status: DC

## 2013-10-25 MED ORDER — CARVEDILOL 12.5 MG PO TABS: 12.5000 mg | ORAL_TABLET | Freq: Two times a day (BID) | ORAL | Status: AC

## 2013-10-25 MED ORDER — POTASSIUM CHLORIDE CRYS ER 20 MEQ PO TBCR: 20.0000 meq | EXTENDED_RELEASE_TABLET | Freq: Every day | ORAL | Status: DC

## 2013-10-25 MED ORDER — INSULIN ASPART 100 UNIT/ML ~~LOC~~ SOLN: 0.0000 [IU] | Freq: Three times a day (TID) | SUBCUTANEOUS | Status: AC

## 2013-10-25 NOTE — Discharge Summary (Addendum)
Physician Discharge Summary  Gina Ashley ZOX:[id number] DOB: 03-07-1927 DOA: 10/22/2013  PCP: Allean Found, MD  Admit date: 10/22/2013 Discharge date: 10/25/2013  Time spent: >35 minutes  Recommendations for Outpatient Follow-up:  HHC HF follow up  F/u with PCP in 1-2 weeks Discharge Diagnoses:  Principal Problem:   CHF exacerbation Active Problems:   DM   HYPERTENSION   GERD   Anemia due to chronic illness   CKD (chronic kidney disease)   Chronic diastolic heart failure   Obesity (BMI 30-39.9)   Depression   Shortness of breath   Renal failure (ARF), acute on chronic   Chronic a-fib   CHF (congestive heart failure)   Atrial flutter   Discharge Condition: stable   Diet recommendation: heart healthy   Filed Weights   10/23/13 0500 10/24/13 0315 10/25/13 0314  Weight: 83.8 kg (184 lb 11.9 oz) 82.8 kg (182 lb 8.7 oz) 82.5 kg (181 lb 14.1 oz)    History of present illness:  79 y.o. female with PMH of a fib on AC/coumaidn, CHF/diastolic heart failure, HTN, DM type 2, COPD, OSA on CPAP, and CKD failure with baseline Cr 2.2 is admitted with acute on chronic CHF   Hospital Course:  1. Acute on chronic CHF; CXR: pleural effusion, congestion; echo (81191): LVEF 55%, dilated LA, RV, RA;  -improved on IV diuresis metolazone, lasix; okay to d/c per cardiology HF clinci follow up   2. A fib on coumadin; cont home regimen  3. DM HA1C-6.8 (01/2013); a1c-8.0; start lantus +ISS, hold meal time scheduled insulin due to hypoglycemia; titrate lantus outpatient as needed; 4. COPD; cont inhalers  5. CKD IV; per nephrology not a candidate for HD; cont close monitor on diuresis  6. HTN cont home regimen    HHC/HF follow up   Procedures:  none (i.e. Studies not automatically included, echos, thoracentesis, etc; not x-rays)  Consultations:  cardiology  Discharge Exam: Filed Vitals:   10/25/13 0829  BP: 106/52  Pulse: 78  Temp: 97.7 F (36.5 C)  Resp: 18     General: alert Cardiovascular: s1,s2, rrr Respiratory: CTA BL  Discharge Instructions  Discharge Orders   Future Orders Complete By Expires   Diet - low sodium heart healthy  As directed    Discharge instructions  As directed    Comments:     Please follow up with primary care doctor in 1-2 week s   Increase activity slowly  As directed        Medication List         acetaminophen 325 MG tablet  Commonly known as:  TYLENOL  Take 650 mg by mouth every 6 (six) hours as needed for pain.     allopurinol 100 MG tablet  Commonly known as:  ZYLOPRIM  Take 100 mg by mouth daily.     ALPRAZolam 0.25 MG tablet  Commonly known as:  XANAX  Take 0.5 tablets (0.125 mg total) by mouth every 8 (eight) hours as needed for anxiety.     amiodarone 200 MG tablet  Commonly known as:  PACERONE  Take 1 tablet (200 mg total) by mouth daily.     busPIRone 5 MG tablet  Commonly known as:  BUSPAR  Take 1 tablet (5 mg total) by mouth every 12 (twelve) hours.     carvedilol 12.5 MG tablet  Commonly known as:  COREG  Take 1 tablet (12.5 mg total) by mouth 2 (two) times daily with a meal.     docusate  sodium 100 MG capsule  Commonly known as:  COLACE  Take 100 mg by mouth daily.     fluticasone 50 MCG/ACT nasal spray  Commonly known as:  FLONASE  Place 1 spray into the nose daily.     guaiFENesin 600 MG 12 hr tablet  Commonly known as:  MUCINEX  Take 600 mg by mouth daily.     insulin aspart 100 UNIT/ML injection  Commonly known as:  novoLOG  Inject 0-15 Units into the skin 3 (three) times daily with meals.     insulin glargine 100 UNIT/ML injection  Commonly known as:  LANTUS  Inject 0.05 mLs (5 Units total) into the skin daily.     metolazone 2.5 MG tablet  Commonly known as:  ZAROXOLYN  On Mondays, can take on Fri if needed     mupirocin ointment 2 %  Commonly known as:  BACTROBAN  Place 1 application into the nose 2 (two) times daily.     nitroGLYCERIN 0.4 MG SL  tablet  Commonly known as:  NITROSTAT  Place 0.4 mg under the tongue every 5 (five) minutes as needed for chest pain.     omeprazole 20 MG capsule  Commonly known as:  PRILOSEC  Take 40 mg by mouth daily.     ondansetron 4 MG tablet  Commonly known as:  ZOFRAN  Take 1 tablet (4 mg total) by mouth every 6 (six) hours as needed for nausea.     oxyCODONE 5 MG immediate release tablet  Commonly known as:  ROXICODONE  Take 1 tablet (5 mg total) by mouth every 6 (six) hours as needed.     polyethylene glycol packet  Commonly known as:  MIRALAX / GLYCOLAX  Take 17 g by mouth 2 (two) times daily.     potassium chloride SA 20 MEQ tablet  Commonly known as:  K-DUR,KLOR-CON  Take 1 tablet (20 mEq total) by mouth daily.     torsemide 20 MG tablet  Commonly known as:  DEMADEX  If weight is 180 or less take 60 mg Twice daily, if weight is over 180 take 80 mg Twice daily     traZODone 50 MG tablet  Commonly known as:  DESYREL  Take 50 mg by mouth at bedtime.     warfarin 5 MG tablet  Commonly known as:  COUMADIN  Take 0.5 tablets (2.5 mg total) by mouth daily. Or as directed       Allergies  Allergen Reactions  . Morphine Sulfate Other (See Comments)    REACTION: change in personality  . Remeron [Mirtazapine] Other (See Comments)    Altered mental status, lethargy  . Prescott Gum [Fish Allergy] Other (See Comments)    unknown  . Penicillins Rash    Tolerates Ancef.       Follow-up Information   Follow up with Allean Found, MD. Schedule an appointment as soon as possible for a visit in 1 week.   Specialty:  Family Medicine   Contact information:   [address], [address] [place] [address] [phone]        The results of significant diagnostics from this hospitalization (including imaging, microbiology, ancillary and laboratory) are listed below for reference.    Significant Diagnostic Studies: Dg Chest 2 View  10/22/2013   CLINICAL DATA:  Shortness of  breath, weight gain  EXAM: CHEST - 2 VIEW  COMPARISON:  08/18/2013  FINDINGS: Stable moderate cardiomegaly. Progression of right pleural effusion, now moderate in size. There  is a trace left pleural effusion. Mild central pulmonary vascular congestion. Degenerative changes in the mid and lower thoracic spine and bilateral shoulders. Atheromatous aorta.  IMPRESSION: 1. Progression of moderate right and small left pleural effusions. 2. Cardiomegaly and pulmonary vascular congestion as before.   Electronically Signed   By: Oley Balm M.D.   On: 10/22/2013 17:51    Microbiology: Recent Results (from the past 240 hour(s))  MRSA PCR SCREENING     Status: Abnormal   Collection Time    10/22/13  8:13 PM      Result Value Range Status   MRSA by PCR POSITIVE (*) NEGATIVE Final   Comment:            The GeneXpert MRSA Assay (FDA     approved for NASAL specimens     only), is one component of a     comprehensive MRSA colonization     surveillance program. It is not     intended to diagnose MRSA     infection nor to guide or     monitor treatment for     MRSA infections.     RESULT CALLED TO, READ BACK BY AND VERIFIED WITH:     T Tilman Neat [phone] [facility]     Labs: Basic Metabolic Panel:  Recent Labs Lab 10/22/13 1628 10/22/13 2114 10/23/13 0540 10/24/13 0515 10/24/13 1109 10/25/13 0500  NA 125*  --  131* 133* 133* 132*  K 2.9*  --  3.5 3.2* 3.5 4.6  CL 80*  --  83* 84* 83* 87*  CO2 36*  --  39* 41* 41* 39*  GLUCOSE 167*  --  180* 164* 206* 129*  BUN 101*  --  100* 97* 94* 95*  CREATININE 3.71*  --  3.33* 3.20* 2.92* 3.03*  CALCIUM 9.2  --  9.4 9.4 9.5 9.7  MG 2.3 2.3  --   --   --   --    Liver Function Tests: No results found for this basename: AST, ALT, ALKPHOS, BILITOT, PROT, ALBUMIN,  in the last 168 hours No results found for this basename: LIPASE, AMYLASE,  in the last 168 hours No results found for this basename: AMMONIA,  in the last 168 hours CBC:  Recent  Labs Lab 10/22/13 1628  WBC 5.2  HGB 11.3*  HCT 33.6*  MCV 85.1  PLT 127*   Cardiac Enzymes: No results found for this basename: CKTOTAL, CKMB, CKMBINDEX, TROPONINI,  in the last 168 hours BNP: BNP (last 3 results)  Recent Labs  06/13/13 1714 07/05/13 1448 10/22/13 1628  PROBNP 3655.0* 3388.0* 4942.0*   CBG:  Recent Labs Lab 10/23/13 2153 10/24/13 0808 10/24/13 1209 10/24/13 1648 10/24/13 2121  GLUCAP 164* 144* 190* 195* 151*       Signed:  Jonette Mate N  [facility] 10/25/2013, 8:47 AM

## 2013-10-25 NOTE — Progress Notes (Signed)
Patient Name: Gina Ashley Date of Encounter: 10/25/2013  Principal Problem:   CHF exacerbation Active Problems:   DM   HYPERTENSION   GERD   Anemia due to chronic illness   CKD (chronic kidney disease)   Chronic diastolic heart failure   Obesity (BMI 30-39.9)   Depression   Shortness of breath   Renal failure (ARF), acute on chronic   Chronic a-fib   CHF (congestive heart failure)   Atrial flutter   Length of Stay: 3  SUBJECTIVE  Diuresed will again last night. Weight down another pound. Now back to baseline weight of 181. BMET pending.   Has chronic AF. Rate controlled with amio and b-blocker.   CURRENT MEDS . allopurinol  100 mg Oral Daily  . amiodarone  200 mg Oral Daily  . busPIRone  5 mg Oral Q12H  . carvedilol  12.5 mg Oral BID WC  . Chlorhexidine Gluconate Cloth  6 each Topical Q0600  . docusate sodium  100 mg Oral Daily  . fluticasone  1 spray Each Nare Daily  . furosemide  80 mg Intravenous TID  . guaiFENesin  600 mg Oral Daily  . insulin aspart  0-15 Units Subcutaneous TID WC  . insulin glargine  5 Units Subcutaneous Daily  . metolazone  2.5 mg Oral Daily  . mupirocin ointment  1 application Nasal BID  . pantoprazole  40 mg Oral Daily  . polyethylene glycol  17 g Oral BID  . potassium chloride  40 mEq Oral Daily  . sodium chloride  3 mL Intravenous Q12H  . traZODone  50 mg Oral QHS  . Warfarin - Pharmacist Dosing Inpatient   Does not apply q1800    OBJECTIVE  Filed Vitals:   10/24/13 1939 10/24/13 2032 10/24/13 2300 10/25/13 0314  BP: 108/63 108/63 125/78 116/62  Pulse:  72    Temp: 97.9 F (36.6 C)  98.5 F (36.9 C) 97.7 F (36.5 C)  TempSrc: Oral  Oral Oral  Resp: 17 16 13 17   Height:      Weight:    82.5 kg (181 lb 14.1 oz)  SpO2: 92% 97% 98% 98%    Intake/Output Summary (Last 24 hours) at 10/25/13 0709 Last data filed at 10/25/13 0300  Gross per 24 hour  Intake    840 ml  Output   1850 ml  Net  -1010 ml   Filed Weights     10/23/13 0500 10/24/13 0315 10/25/13 0314  Weight: 83.8 kg (184 lb 11.9 oz) 82.8 kg (182 lb 8.7 oz) 82.5 kg (181 lb 14.1 oz)    PHYSICAL EXAM  General: Pleasant, NAD. Neuro: Alert and oriented X 3. Moves all extremities spontaneously. Psych: Normal affect. HEENT:  Normal  Neck: Supple without bruits, JVD + 10 cm. Lungs:  Resp regular and unlabored, rales and crakles up to mid lungs, decreased BS at the right base. Heart: RRR no s3, s4, or murmurs. Abdomen: Soft, non-tender, non-distended, BS + x 4.  Extremities: No clubbing, cyanosis, mild edema B/L. DP/PT/Radials 2+ and equal bilaterally.  Accessory Clinical Findings  CBC  Recent Labs  10/22/13 1628  WBC 5.2  HGB 11.3*  HCT 33.6*  MCV 85.1  PLT 127*   Basic Metabolic Panel  Recent Labs  10/22/13 1628 10/22/13 2114  10/24/13 0515 10/24/13 1109  NA 125*  --   < > 133* 133*  K 2.9*  --   < > 3.2* 3.5  CL 80*  --   < >  84* 83*  CO2 36*  --   < > 41* 41*  GLUCOSE 167*  --   < > 164* 206*  BUN 101*  --   < > 97* 94*  CREATININE 3.71*  --   < > 3.20* 2.92*  CALCIUM 9.2  --   < > 9.4 9.5  MG 2.3 2.3  --   --   --   < > = values in this interval not displayed.  Dg Chest 2 View  10/22/2013   CLINICAL DATA:  Shortness of breath, weight gain  EXAM: CHEST - 2 VIEW  COMPARISON:  08/18/2013  FINDINGS: Stable moderate cardiomegaly. Progression of right pleural effusion, now moderate in size. There is a trace left pleural effusion. Mild central pulmonary vascular congestion. Degenerative changes in the mid and lower thoracic spine and bilateral shoulders. Atheromatous aorta.  IMPRESSION: 1. Progression of moderate right and small left pleural effusions. 2. Cardiomegaly and pulmonary vascular congestion as before.     TELE; A flutter with variable block, HR 70-100    ASSESSMENT AND PLAN  79 yo female   1. Acute on chronic diastolic heart failure 2. H/o chronic a-fib, rate controlled on home amiodarone  3. A/C renal  failure - improving 4. DM2 5. Hyponatremia/hypokalemia.  She is much improved. Now back to baseline. Will await BMET but can likely d/c home today on previous regimen. Her AF is chronic and rate controlled on amio and b-block. Would not attempt DC-CV (has failed in past). Supp electrolytes as needed.   Arvilla Meres MD, 10/25/2013

## 2013-10-25 NOTE — Progress Notes (Signed)
Pt discharged home with all belongings, instructions given to daughter both verbalized understanding.

## 2013-10-25 NOTE — Care Management Note (Signed)
    Page 1 of 1   10/25/2013     10:46:45 AM   CARE MANAGEMENT NOTE 10/25/2013  Patient:  [facility]   Account Number:  [id number]  Date Initiated:  10/25/2013  Documentation initiated by:  Junius Creamer  Subjective/Objective Assessment:   adm w chf     Action/Plan:   lives w fam, pcp dr Sonny Masters [email or web address]   Anticipated DC Date:     Anticipated DC Plan:  HOME W HOME HEALTH SERVICES      DC Planning Services  CM consult      [facility] Choice  Resumption Of Svcs/PTA Provider   Choice offered to / List presented to:          [facility] arranged  HH-1 RN  HH-10 DISEASE MANAGEMENT  HH-2 PT      HH agency  [facility]   Status of service:   Medicare Important Message given?   (If response is "NO", the following Medicare IM given date fields will be blank) Date Medicare IM given:   Date Additional Medicare IM given:    Discharge Disposition:  HOME W HOME HEALTH SERVICES  Per UR Regulation:  Reviewed for med. necessity/level of care/duration of stay  If discussed at Long Length of Stay Meetings, dates discussed:    Comments:  12/22 1045 debbie Kaizley Aja rn,bsn debbie taylor w [facility] alerted of adm. they will resumr [facility]hc at disch.

## 2013-10-31 ENCOUNTER — Encounter: Payer: Self-pay | Admitting: Internal Medicine

## 2013-11-02 ENCOUNTER — Telehealth: Payer: Self-pay | Admitting: *Deleted

## 2013-11-02 NOTE — Telephone Encounter (Signed)
Christie nurse with [facility] states unable to obtain INR yesterday order given to obtain INR on 11/03/2013 and she states understanding

## 2013-11-03 ENCOUNTER — Ambulatory Visit (INDEPENDENT_AMBULATORY_CARE_PROVIDER_SITE_OTHER): Payer: Medicare Other | Admitting: Pharmacist

## 2013-11-03 DIAGNOSIS — Z7901 Long term (current) use of anticoagulants: Secondary | ICD-10-CM

## 2013-11-03 LAB — POCT INR: INR: 2.7

## 2013-11-04 ENCOUNTER — Ambulatory Visit: Payer: Self-pay | Admitting: Internal Medicine

## 2013-11-08 ENCOUNTER — Encounter: Payer: Self-pay | Admitting: Internal Medicine

## 2013-11-16 IMAGING — CR DG FEMUR 2+V*R*
5 series · 5 of 5 positions shown · non-contrast
Comparison: None.

CLINICAL DATA: Post fall, now with severe pain and tenderness
involving the distal aspect of the femur

RIGHT FEMUR - 2 VIEW

[x femur proximal ap right (1 of 2)]
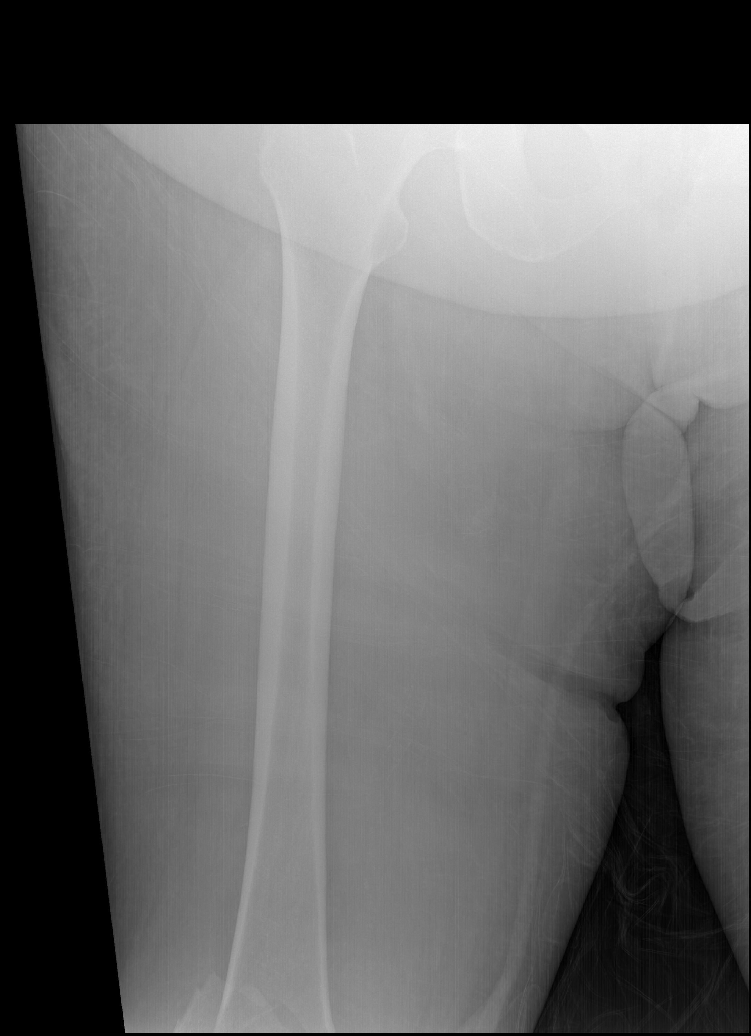

[x femur proximal ap right (2 of 2)]
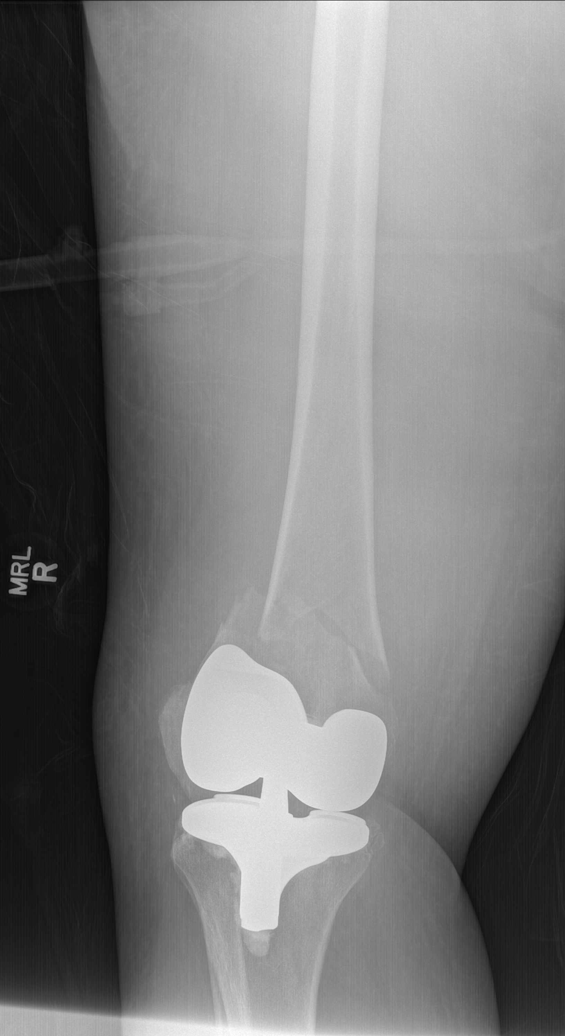

[w hip lat right (1 of 3)]
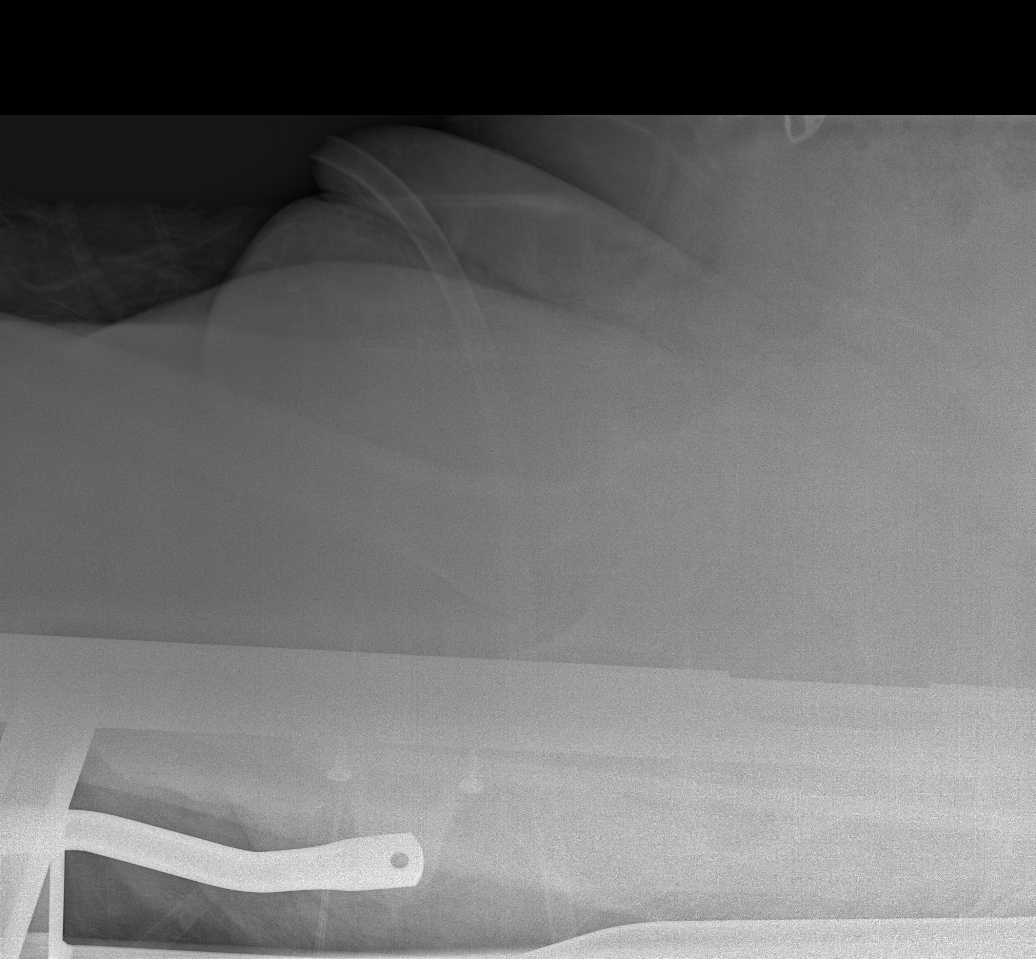

[w hip lat right (2 of 3)]
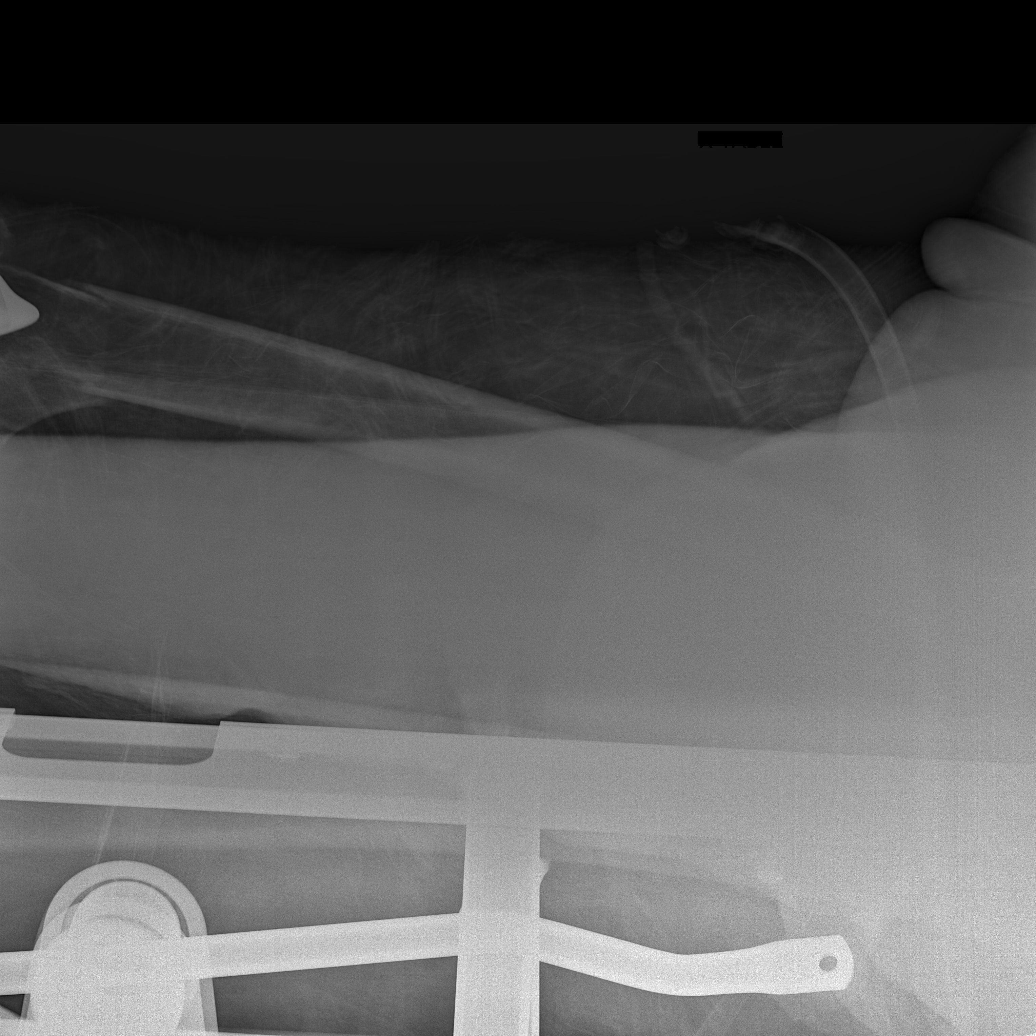

[w hip lat right (3 of 3)]
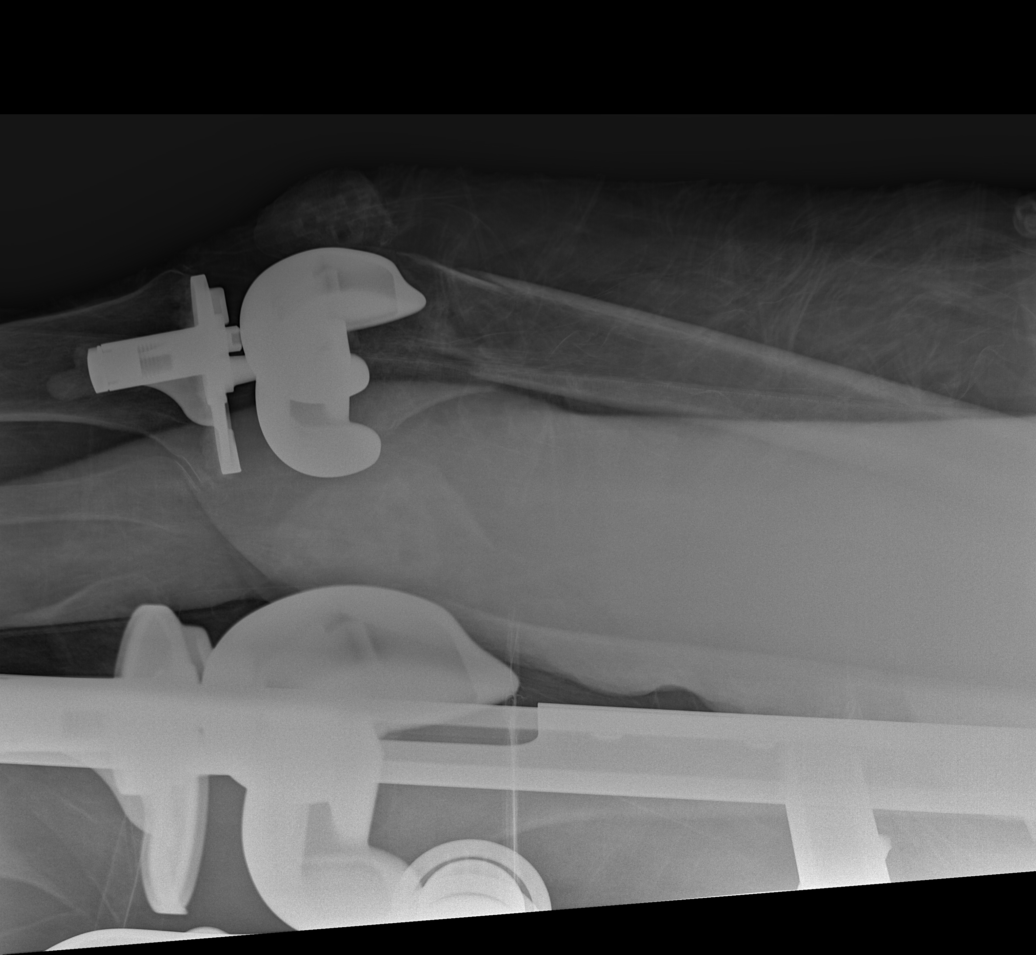

[5 of 5 positions shown; findings below may reference images not displayed]

FINDINGS: There is an oblique, minimally displaced, slightly impacted
fracture of the distal femoral metaphysis.  There is no definitive
extension of the fracture into the right total knee replacement
hardware.  No definite evidence of hardware failure or loosening.
No definite suprapatellar joint effusion, though evaluation
degraded due to technique.  No radiopaque foreign body.
IMPRESSION: Oblique, minimally displaced, slightly impacted fracture of the
distal femoral metaphysis without definite extension to involve the
femoral component of the right total knee replacement.

## 2013-11-19 DIAGNOSIS — I509 Heart failure, unspecified: Secondary | ICD-10-CM

## 2013-11-19 DIAGNOSIS — I129 Hypertensive chronic kidney disease with stage 1 through stage 4 chronic kidney disease, or unspecified chronic kidney disease: Secondary | ICD-10-CM

## 2013-11-19 DIAGNOSIS — N189 Chronic kidney disease, unspecified: Secondary | ICD-10-CM

## 2013-11-19 DIAGNOSIS — E119 Type 2 diabetes mellitus without complications: Secondary | ICD-10-CM

## 2013-11-21 IMAGING — CR DG CHEST 1V PORT
1 series · 1 of 1 positions shown · non-contrast
Comparison: Plain film chest 08/28/2012 and 08/30/2012.

CLINICAL DATA: Pleural effusions.

PORTABLE CHEST - 1 VIEW

[AP]
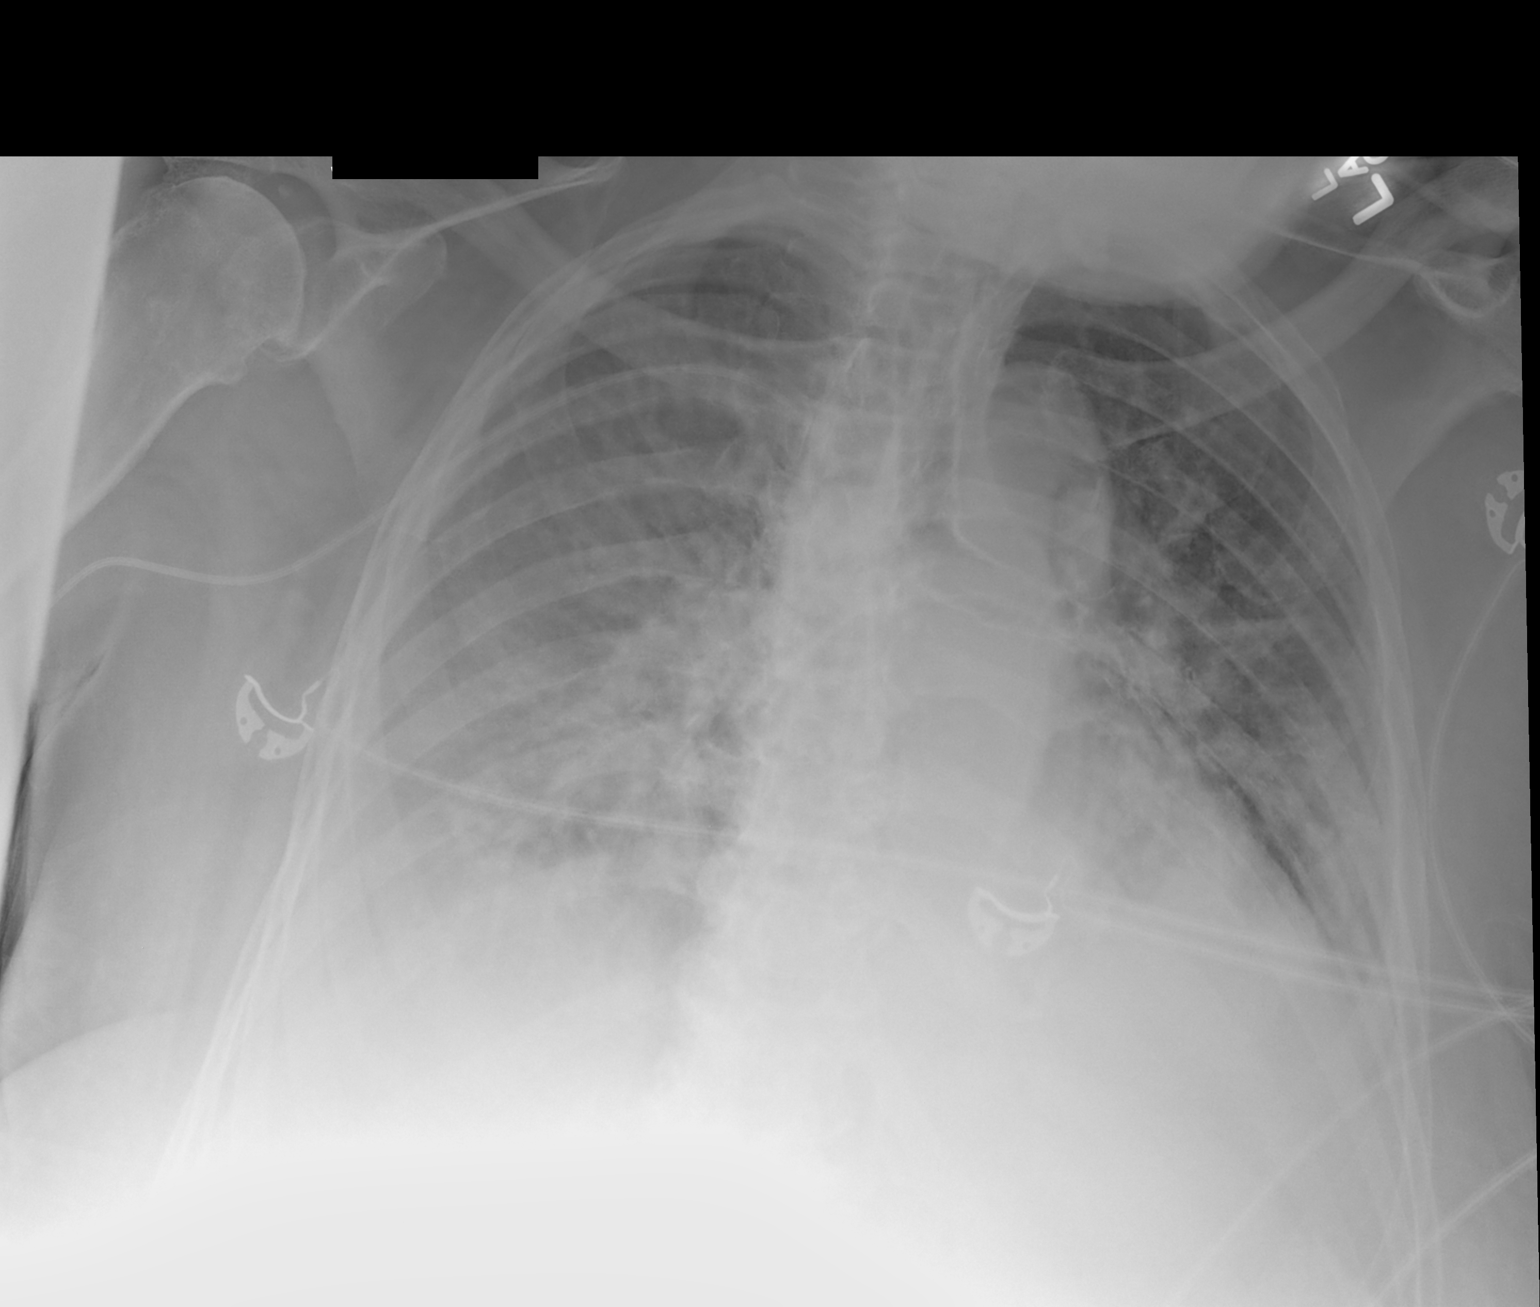

[1 of 1 positions shown; findings below may reference images not displayed]

FINDINGS: Right worse than left pleural effusions and airspace
disease do not appear changed compared to the 08/28/2012 study.
There is cardiomegaly.  Right PICC remains in place.
IMPRESSION: No marked change in right worse than left effusions and airspace
disease compared to the 08/28/2012 study.

## 2013-11-22 LAB — POCT INR: INR: 2.3

## 2013-11-23 ENCOUNTER — Encounter: Payer: Self-pay | Admitting: Internal Medicine

## 2013-11-29 ENCOUNTER — Telehealth (HOSPITAL_COMMUNITY): Payer: Self-pay | Admitting: *Deleted

## 2013-11-29 ENCOUNTER — Inpatient Hospital Stay: Payer: Self-pay | Admitting: Internal Medicine

## 2013-11-29 ENCOUNTER — Ambulatory Visit (INDEPENDENT_AMBULATORY_CARE_PROVIDER_SITE_OTHER): Payer: Medicare Other | Admitting: Pharmacist

## 2013-11-29 DIAGNOSIS — Z7901 Long term (current) use of anticoagulants: Secondary | ICD-10-CM

## 2013-11-29 LAB — BASIC METABOLIC PANEL
ANION GAP: 4 — AB (ref 7–16)
Anion Gap: 3 — ABNORMAL LOW (ref 7–16)
BUN: 93 mg/dL — ABNORMAL HIGH (ref 7–18)
BUN: 95 mg/dL — AB (ref 7–18)
CO2: 38 mmol/L — AB (ref 21–32)
Calcium, Total: 9.9 mg/dL (ref 8.5–10.1)
Calcium, Total: 9.4 mg/dL (ref 8.5–10.1)
Chloride: 75 mmol/L — ABNORMAL LOW (ref 98–107)
Chloride: 78 mmol/L — ABNORMAL LOW (ref 98–107)
Co2: 38 mmol/L — ABNORMAL HIGH (ref 21–32)
Creatinine: 3.22 mg/dL — ABNORMAL HIGH (ref 0.60–1.30)
Creatinine: 2.95 mg/dL — ABNORMAL HIGH (ref 0.60–1.30)
EGFR (African American): 16 — ABNORMAL LOW
EGFR (Non-African Amer.): 14 — ABNORMAL LOW
GFR CALC AF AMER: 15 — AB
GFR CALC NON AF AMER: 13 — AB
GLUCOSE: 177 mg/dL — AB (ref 65–99)
Glucose: 213 mg/dL — ABNORMAL HIGH (ref 65–99)
OSMOLALITY: 272 (ref 275–301)
Osmolality: 274 (ref 275–301)
POTASSIUM: 3.4 mmol/L — AB (ref 3.5–5.1)
Potassium: 3.9 mmol/L (ref 3.5–5.1)
SODIUM: 117 mmol/L — AB (ref 136–145)
Sodium: 119 mmol/L — CL (ref 136–145)

## 2013-11-29 LAB — URINALYSIS, COMPLETE
GLUCOSE, UR: NEGATIVE mg/dL (ref 0–75)
Ketone: NEGATIVE
NITRITE: NEGATIVE
PH: 6 (ref 4.5–8.0)
Protein: NEGATIVE
SPECIFIC GRAVITY: 1.008 (ref 1.003–1.030)
WBC UR: 123 /HPF (ref 0–5)

## 2013-11-29 LAB — CBC WITH DIFFERENTIAL/PLATELET
BASOS PCT: 0.3 %
Basophil #: 0 10*3/uL (ref 0.0–0.1)
Eosinophil #: 0 10*3/uL (ref 0.0–0.7)
Eosinophil %: 0.5 %
HCT: 38.3 % (ref 35.0–47.0)
HGB: 12.5 g/dL (ref 12.0–16.0)
Lymphocyte #: 0.6 10*3/uL — ABNORMAL LOW (ref 1.0–3.6)
Lymphocyte %: 7.9 %
MCH: 28.4 pg (ref 26.0–34.0)
MCHC: 32.8 g/dL (ref 32.0–36.0)
MCV: 87 fL (ref 80–100)
MONOS PCT: 6.9 %
Monocyte #: 0.5 x10 3/mm (ref 0.2–0.9)
Neutrophil #: 6 10*3/uL (ref 1.4–6.5)
Neutrophil %: 84.4 %
Platelet: 87 10*3/uL — ABNORMAL LOW (ref 150–440)
RBC: 4.41 10*6/uL (ref 3.80–5.20)
RDW: 15.5 % — ABNORMAL HIGH (ref 11.5–14.5)
WBC: 7.1 10*3/uL (ref 3.6–11.0)

## 2013-11-29 LAB — MAGNESIUM: Magnesium: 2 mg/dL

## 2013-11-29 LAB — CK TOTAL AND CKMB (NOT AT ARMC)
CK, Total: 52 U/L (ref 21–215)
CK-MB: <0.5 ng/mL — ABNORMAL LOW (ref 0.5–3.6)

## 2013-11-29 LAB — PROTIME-INR
INR: 1.8
PROTHROMBIN TIME: 20.6 s — AB (ref 11.5–14.7)

## 2013-11-29 LAB — TROPONIN I

## 2013-11-29 NOTE — Telephone Encounter (Signed)
Christy, RN with [facility] called, she states pt's daughter had called her to come out today b/c pt isn't feeling well.  She states pt is extremely weak today, barely able to stand or feed herself, VS are all stable BP 120s HR 57 lungs clear, wt 183 has been around 183-186 since 1/12 and pt has been taking Torsemide 80 mg BID, she did take her metolazone today, Alyse Low is very concerned about pt and has called EMS

## 2013-11-30 DIAGNOSIS — I4891 Unspecified atrial fibrillation: Secondary | ICD-10-CM

## 2013-11-30 DIAGNOSIS — I509 Heart failure, unspecified: Secondary | ICD-10-CM

## 2013-11-30 LAB — COMPREHENSIVE METABOLIC PANEL
ALK PHOS: 156 U/L — AB
ANION GAP: 5 — AB (ref 7–16)
Albumin: 2.9 g/dL — ABNORMAL LOW (ref 3.4–5.0)
BUN: 92 mg/dL — ABNORMAL HIGH (ref 7–18)
Bilirubin,Total: 1.1 mg/dL — ABNORMAL HIGH (ref 0.2–1.0)
CALCIUM: 8.8 mg/dL (ref 8.5–10.1)
Chloride: 84 mmol/L — ABNORMAL LOW (ref 98–107)
Co2: 34 mmol/L — ABNORMAL HIGH (ref 21–32)
Creatinine: 2.88 mg/dL — ABNORMAL HIGH (ref 0.60–1.30)
EGFR (Non-African Amer.): 14 — ABNORMAL LOW
GFR CALC AF AMER: 17 — AB
Glucose: 103 mg/dL — ABNORMAL HIGH (ref 65–99)
Osmolality: 276 (ref 275–301)
Potassium: 3.2 mmol/L — ABNORMAL LOW (ref 3.5–5.1)
SGOT(AST): 24 U/L (ref 15–37)
SGPT (ALT): 13 U/L (ref 12–78)
Sodium: 123 mmol/L — ABNORMAL LOW (ref 136–145)
TOTAL PROTEIN: 6.7 g/dL (ref 6.4–8.2)

## 2013-11-30 LAB — CBC WITH DIFFERENTIAL/PLATELET
BASOS ABS: 0 10*3/uL (ref 0.0–0.1)
BASOS PCT: 0.4 %
EOS ABS: 0.1 10*3/uL (ref 0.0–0.7)
Eosinophil %: 0.9 %
HCT: 34 % — AB (ref 35.0–47.0)
HGB: 11.2 g/dL — ABNORMAL LOW (ref 12.0–16.0)
Lymphocyte #: 0.8 10*3/uL — ABNORMAL LOW (ref 1.0–3.6)
Lymphocyte %: 13.7 %
MCH: 28.4 pg (ref 26.0–34.0)
MCHC: 32.9 g/dL (ref 32.0–36.0)
MCV: 86 fL (ref 80–100)
MONOS PCT: 10.1 %
Monocyte #: 0.6 x10 3/mm (ref 0.2–0.9)
Neutrophil #: 4.4 10*3/uL (ref 1.4–6.5)
Neutrophil %: 74.9 %
Platelet: 85 10*3/uL — ABNORMAL LOW (ref 150–440)
RBC: 3.93 10*6/uL (ref 3.80–5.20)
RDW: 15.5 % — AB (ref 11.5–14.5)
WBC: 5.9 10*3/uL (ref 3.6–11.0)

## 2013-11-30 LAB — TSH: Thyroid Stimulating Horm: 0.46 u[IU]/mL

## 2013-11-30 LAB — PRO B NATRIURETIC PEPTIDE: B-Type Natriuretic Peptide: 5230 pg/mL — ABNORMAL HIGH (ref 0–450)

## 2013-11-30 LAB — PROTIME-INR
INR: 2.1
PROTHROMBIN TIME: 23.1 s — AB (ref 11.5–14.7)

## 2013-12-01 DIAGNOSIS — I5032 Chronic diastolic (congestive) heart failure: Secondary | ICD-10-CM

## 2013-12-01 DIAGNOSIS — I4891 Unspecified atrial fibrillation: Secondary | ICD-10-CM

## 2013-12-01 LAB — BASIC METABOLIC PANEL
ANION GAP: 6 — AB (ref 7–16)
BUN: 83 mg/dL — ABNORMAL HIGH (ref 7–18)
CHLORIDE: 88 mmol/L — AB (ref 98–107)
Calcium, Total: 9 mg/dL (ref 8.5–10.1)
Co2: 33 mmol/L — ABNORMAL HIGH (ref 21–32)
Creatinine: 2.67 mg/dL — ABNORMAL HIGH (ref 0.60–1.30)
EGFR (African American): 18 — ABNORMAL LOW
GFR CALC NON AF AMER: 16 — AB
Glucose: 138 mg/dL — ABNORMAL HIGH (ref 65–99)
Osmolality: 283 (ref 275–301)
Potassium: 3.4 mmol/L — ABNORMAL LOW (ref 3.5–5.1)
SODIUM: 127 mmol/L — AB (ref 136–145)

## 2013-12-01 LAB — PROTIME-INR
INR: 2
PROTHROMBIN TIME: 22.3 s — AB (ref 11.5–14.7)

## 2013-12-02 LAB — BASIC METABOLIC PANEL
ANION GAP: 4 — AB (ref 7–16)
BUN: 90 mg/dL — AB (ref 7–18)
Calcium, Total: 9.5 mg/dL (ref 8.5–10.1)
Chloride: 86 mmol/L — ABNORMAL LOW (ref 98–107)
Co2: 36 mmol/L — ABNORMAL HIGH (ref 21–32)
Creatinine: 2.93 mg/dL — ABNORMAL HIGH (ref 0.60–1.30)
EGFR (Non-African Amer.): 14 — ABNORMAL LOW
GFR CALC AF AMER: 16 — AB
GLUCOSE: 141 mg/dL — AB (ref 65–99)
OSMOLALITY: 283 (ref 275–301)
POTASSIUM: 3.9 mmol/L (ref 3.5–5.1)
Sodium: 126 mmol/L — ABNORMAL LOW (ref 136–145)

## 2013-12-02 LAB — PROTIME-INR
INR: 2.4
Prothrombin Time: 25.2 secs — ABNORMAL HIGH (ref 11.5–14.7)

## 2013-12-05 ENCOUNTER — Emergency Department (HOSPITAL_COMMUNITY)

## 2013-12-05 ENCOUNTER — Telehealth (HOSPITAL_BASED_OUTPATIENT_CLINIC_OR_DEPARTMENT_OTHER): Payer: Self-pay | Admitting: Emergency Medicine

## 2013-12-05 ENCOUNTER — Ambulatory Visit: Payer: Self-pay | Admitting: Internal Medicine

## 2013-12-05 ENCOUNTER — Inpatient Hospital Stay (HOSPITAL_COMMUNITY)
Admission: EM | Admit: 2013-12-05 | Discharge: 2013-12-08 | DRG: 690 | Disposition: A | Discharge status: Home / Self Care | Attending: Internal Medicine | Admitting: Internal Medicine

## 2013-12-05 ENCOUNTER — Encounter (HOSPITAL_COMMUNITY): Payer: Self-pay | Admitting: Emergency Medicine

## 2013-12-05 DIAGNOSIS — M25559 Pain in unspecified hip: Secondary | ICD-10-CM

## 2013-12-05 DIAGNOSIS — N185 Chronic kidney disease, stage 5: Secondary | ICD-10-CM | POA: Diagnosis present

## 2013-12-05 DIAGNOSIS — M549 Dorsalgia, unspecified: Secondary | ICD-10-CM

## 2013-12-05 DIAGNOSIS — F3289 Other specified depressive episodes: Secondary | ICD-10-CM | POA: Diagnosis present

## 2013-12-05 DIAGNOSIS — D6832 Hemorrhagic disorder due to extrinsic circulating anticoagulants: Secondary | ICD-10-CM

## 2013-12-05 DIAGNOSIS — E119 Type 2 diabetes mellitus without complications: Secondary | ICD-10-CM

## 2013-12-05 DIAGNOSIS — G4700 Insomnia, unspecified: Secondary | ICD-10-CM

## 2013-12-05 DIAGNOSIS — Z7901 Long term (current) use of anticoagulants: Secondary | ICD-10-CM

## 2013-12-05 DIAGNOSIS — F329 Major depressive disorder, single episode, unspecified: Secondary | ICD-10-CM

## 2013-12-05 DIAGNOSIS — I4891 Unspecified atrial fibrillation: Secondary | ICD-10-CM | POA: Diagnosis present

## 2013-12-05 DIAGNOSIS — R0602 Shortness of breath: Secondary | ICD-10-CM

## 2013-12-05 DIAGNOSIS — F411 Generalized anxiety disorder: Secondary | ICD-10-CM

## 2013-12-05 DIAGNOSIS — Z9981 Dependence on supplemental oxygen: Secondary | ICD-10-CM

## 2013-12-05 DIAGNOSIS — E871 Hypo-osmolality and hyponatremia: Secondary | ICD-10-CM

## 2013-12-05 DIAGNOSIS — J309 Allergic rhinitis, unspecified: Secondary | ICD-10-CM

## 2013-12-05 DIAGNOSIS — N390 Urinary tract infection, site not specified: Principal | ICD-10-CM

## 2013-12-05 DIAGNOSIS — J90 Pleural effusion, not elsewhere classified: Secondary | ICD-10-CM

## 2013-12-05 DIAGNOSIS — T502X5A Adverse effect of carbonic-anhydrase inhibitors, benzothiadiazides and other diuretics, initial encounter: Secondary | ICD-10-CM | POA: Diagnosis present

## 2013-12-05 DIAGNOSIS — D638 Anemia in other chronic diseases classified elsewhere: Secondary | ICD-10-CM

## 2013-12-05 DIAGNOSIS — I4892 Unspecified atrial flutter: Secondary | ICD-10-CM

## 2013-12-05 DIAGNOSIS — I120 Hypertensive chronic kidney disease with stage 5 chronic kidney disease or end stage renal disease: Secondary | ICD-10-CM | POA: Diagnosis present

## 2013-12-05 DIAGNOSIS — G4733 Obstructive sleep apnea (adult) (pediatric): Secondary | ICD-10-CM

## 2013-12-05 DIAGNOSIS — E875 Hyperkalemia: Secondary | ICD-10-CM

## 2013-12-05 DIAGNOSIS — I509 Heart failure, unspecified: Secondary | ICD-10-CM

## 2013-12-05 DIAGNOSIS — F32A Depression, unspecified: Secondary | ICD-10-CM

## 2013-12-05 DIAGNOSIS — Z228 Carrier of other infectious diseases: Secondary | ICD-10-CM

## 2013-12-05 DIAGNOSIS — J4489 Other specified chronic obstructive pulmonary disease: Secondary | ICD-10-CM | POA: Diagnosis present

## 2013-12-05 DIAGNOSIS — J9610 Chronic respiratory failure, unspecified whether with hypoxia or hypercapnia: Secondary | ICD-10-CM

## 2013-12-05 DIAGNOSIS — R627 Adult failure to thrive: Secondary | ICD-10-CM

## 2013-12-05 DIAGNOSIS — Z96659 Presence of unspecified artificial knee joint: Secondary | ICD-10-CM

## 2013-12-05 DIAGNOSIS — Z66 Do not resuscitate: Secondary | ICD-10-CM | POA: Diagnosis present

## 2013-12-05 DIAGNOSIS — I5032 Chronic diastolic (congestive) heart failure: Secondary | ICD-10-CM

## 2013-12-05 DIAGNOSIS — R339 Retention of urine, unspecified: Secondary | ICD-10-CM

## 2013-12-05 DIAGNOSIS — Z515 Encounter for palliative care: Secondary | ICD-10-CM

## 2013-12-05 DIAGNOSIS — J449 Chronic obstructive pulmonary disease, unspecified: Secondary | ICD-10-CM | POA: Diagnosis present

## 2013-12-05 DIAGNOSIS — T45515A Adverse effect of anticoagulants, initial encounter: Secondary | ICD-10-CM

## 2013-12-05 DIAGNOSIS — I10 Essential (primary) hypertension: Secondary | ICD-10-CM

## 2013-12-05 DIAGNOSIS — I5033 Acute on chronic diastolic (congestive) heart failure: Secondary | ICD-10-CM

## 2013-12-05 DIAGNOSIS — N179 Acute kidney failure, unspecified: Secondary | ICD-10-CM

## 2013-12-05 DIAGNOSIS — R531 Weakness: Secondary | ICD-10-CM

## 2013-12-05 DIAGNOSIS — K219 Gastro-esophageal reflux disease without esophagitis: Secondary | ICD-10-CM

## 2013-12-05 DIAGNOSIS — E1169 Type 2 diabetes mellitus with other specified complication: Secondary | ICD-10-CM

## 2013-12-05 DIAGNOSIS — I482 Chronic atrial fibrillation, unspecified: Secondary | ICD-10-CM

## 2013-12-05 DIAGNOSIS — N189 Chronic kidney disease, unspecified: Secondary | ICD-10-CM

## 2013-12-05 DIAGNOSIS — E669 Obesity, unspecified: Secondary | ICD-10-CM

## 2013-12-05 LAB — GLUCOSE, CAPILLARY
GLUCOSE-CAPILLARY: 203 mg/dL — AB (ref 70–99)
Glucose-Capillary: 191 mg/dL — ABNORMAL HIGH (ref 70–99)

## 2013-12-05 LAB — URINE MICROSCOPIC-ADD ON

## 2013-12-05 LAB — URINALYSIS, ROUTINE W REFLEX MICROSCOPIC
Bilirubin Urine: NEGATIVE
Glucose, UA: NEGATIVE mg/dL
Ketones, ur: NEGATIVE mg/dL
Nitrite: NEGATIVE
PH: 5 (ref 5.0–8.0)
Protein, ur: NEGATIVE mg/dL
Specific Gravity, Urine: 1.017 (ref 1.005–1.030)
UROBILINOGEN UA: 0.2 mg/dL (ref 0.0–1.0)

## 2013-12-05 LAB — CBC WITH DIFFERENTIAL/PLATELET
Basophils Absolute: 0 10*3/uL (ref 0.0–0.1)
Basophils Relative: 0 % (ref 0–1)
Eosinophils Absolute: 0.3 10*3/uL (ref 0.0–0.7)
Eosinophils Relative: 3 % (ref 0–5)
HCT: 35.4 % — ABNORMAL LOW (ref 36.0–46.0)
HEMOGLOBIN: 12.1 g/dL (ref 12.0–15.0)
LYMPHS ABS: 0.8 10*3/uL (ref 0.7–4.0)
LYMPHS PCT: 9 % — AB (ref 12–46)
MCH: 29 pg (ref 26.0–34.0)
MCHC: 34.2 g/dL (ref 30.0–36.0)
MCV: 84.9 fL (ref 78.0–100.0)
MONOS PCT: 9 % (ref 3–12)
Monocytes Absolute: 0.9 10*3/uL (ref 0.1–1.0)
NEUTROS PCT: 79 % — AB (ref 43–77)
Neutro Abs: 7.4 10*3/uL (ref 1.7–7.7)
PLATELETS: 134 10*3/uL — AB (ref 150–400)
RBC: 4.17 MIL/uL (ref 3.87–5.11)
RDW: 14.6 % (ref 11.5–15.5)
WBC: 9.4 10*3/uL (ref 4.0–10.5)

## 2013-12-05 LAB — COMPREHENSIVE METABOLIC PANEL
ALT: 20 U/L (ref 0–35)
AST: 29 U/L (ref 0–37)
Albumin: 3.2 g/dL — ABNORMAL LOW (ref 3.5–5.2)
Alkaline Phosphatase: 162 U/L — ABNORMAL HIGH (ref 39–117)
BILIRUBIN TOTAL: 0.7 mg/dL (ref 0.3–1.2)
BUN: 113 mg/dL — ABNORMAL HIGH (ref 6–23)
CO2: 31 meq/L (ref 19–32)
Calcium: 9.3 mg/dL (ref 8.4–10.5)
Chloride: 80 mEq/L — ABNORMAL LOW (ref 96–112)
Creatinine, Ser: 3.58 mg/dL — ABNORMAL HIGH (ref 0.50–1.10)
GFR, EST AFRICAN AMERICAN: 12 mL/min — AB (ref >90)
GFR, EST NON AFRICAN AMERICAN: 11 mL/min — AB (ref >90)
GLUCOSE: 131 mg/dL — AB (ref 70–99)
POTASSIUM: 5.5 meq/L — AB (ref 3.7–5.3)
SODIUM: 122 meq/L — AB (ref 137–147)
Total Protein: 7.1 g/dL (ref 6.0–8.3)

## 2013-12-05 LAB — MRSA PCR SCREENING: MRSA by PCR: NEGATIVE

## 2013-12-05 LAB — CBC
HCT: 36.1 % (ref 36.0–46.0)
Hemoglobin: 11.9 g/dL — ABNORMAL LOW (ref 12.0–15.0)
MCH: 28.2 pg (ref 26.0–34.0)
MCHC: 33 g/dL (ref 30.0–36.0)
MCV: 85.5 fL (ref 78.0–100.0)
Platelets: 111 10*3/uL — ABNORMAL LOW (ref 150–400)
RBC: 4.22 MIL/uL (ref 3.87–5.11)
RDW: 14.7 % (ref 11.5–15.5)
WBC: 8.2 10*3/uL (ref 4.0–10.5)

## 2013-12-05 LAB — PHOSPHORUS: Phosphorus: 3.4 mg/dL (ref 2.3–4.6)

## 2013-12-05 LAB — PROTIME-INR
INR: 2.28 — AB (ref 0.00–1.49)
Prothrombin Time: 24.4 seconds — ABNORMAL HIGH (ref 11.6–15.2)

## 2013-12-05 LAB — CREATININE, SERUM
Creatinine, Ser: 3.57 mg/dL — ABNORMAL HIGH (ref 0.50–1.10)
GFR calc Af Amer: 13 mL/min — ABNORMAL LOW (ref >90)
GFR, EST NON AFRICAN AMERICAN: 11 mL/min — AB (ref >90)

## 2013-12-05 LAB — POCT I-STAT TROPONIN I: Troponin i, poc: 0.02 ng/mL (ref 0.00–0.08)

## 2013-12-05 LAB — PRO B NATRIURETIC PEPTIDE: Pro B Natriuretic peptide (BNP): 9189 pg/mL — ABNORMAL HIGH (ref 0–450)

## 2013-12-05 LAB — MAGNESIUM: Magnesium: 3.1 mg/dL — ABNORMAL HIGH (ref 1.5–2.5)

## 2013-12-05 MED ORDER — ONDANSETRON HCL 4 MG/2ML IJ SOLN: 4.0000 mg | Freq: Four times a day (QID) | INTRAMUSCULAR | Status: DC | PRN

## 2013-12-05 MED ORDER — DEXTROSE 5 % IV SOLN
1.0000 g | INTRAVENOUS | Status: DC
  Filled 2013-12-05: qty 10

## 2013-12-05 MED ORDER — HYDROMORPHONE HCL PF 1 MG/ML IJ SOLN
1.0000 mg | INTRAMUSCULAR | Status: DC | PRN
  Administered 2013-12-05 – 2013-12-06 (×2): 1 mg via INTRAVENOUS
  Filled 2013-12-05 (×3): qty 1

## 2013-12-05 MED ORDER — ACETAMINOPHEN 650 MG RE SUPP: 650.0000 mg | Freq: Four times a day (QID) | RECTAL | Status: DC | PRN

## 2013-12-05 MED ORDER — PANTOPRAZOLE SODIUM 40 MG PO TBEC
40.0000 mg | DELAYED_RELEASE_TABLET | Freq: Every day | ORAL | Status: DC
Start: 2013-12-05 — End: 2013-12-08
  Administered 2013-12-05 – 2013-12-08 (×4): 40 mg via ORAL
  Filled 2013-12-05 (×4): qty 1

## 2013-12-05 MED ORDER — AMIODARONE HCL 200 MG PO TABS
200.0000 mg | ORAL_TABLET | Freq: Every day | ORAL | Status: DC
  Administered 2013-12-05 – 2013-12-08 (×4): 200 mg via ORAL
  Filled 2013-12-05 (×4): qty 1

## 2013-12-05 MED ORDER — HEPARIN SODIUM (PORCINE) 5000 UNIT/ML IJ SOLN
5000.0000 [IU] | Freq: Three times a day (TID) | INTRAMUSCULAR | Status: DC
  Administered 2013-12-05 – 2013-12-07 (×7): 5000 [IU] via SUBCUTANEOUS
  Filled 2013-12-05 (×9): qty 1

## 2013-12-05 MED ORDER — INSULIN ASPART 100 UNIT/ML ~~LOC~~ SOLN
0.0000 [IU] | Freq: Every day | SUBCUTANEOUS | Status: DC
  Administered 2013-12-06 – 2013-12-07 (×2): 2 [IU] via SUBCUTANEOUS

## 2013-12-05 MED ORDER — ALPRAZOLAM 0.25 MG PO TABS
0.1250 mg | ORAL_TABLET | Freq: Three times a day (TID) | ORAL | Status: DC | PRN
  Administered 2013-12-06 – 2013-12-08 (×3): 0.125 mg via ORAL
  Filled 2013-12-05 (×5): qty 1

## 2013-12-05 MED ORDER — SODIUM CHLORIDE 0.9 % IV SOLN
INTRAVENOUS | Status: DC
  Administered 2013-12-05: 16:00:00 via INTRAVENOUS

## 2013-12-05 MED ORDER — FLUTICASONE PROPIONATE 50 MCG/ACT NA SUSP
1.0000 | Freq: Every day | NASAL | Status: DC
  Administered 2013-12-05 – 2013-12-08 (×4): 1 via NASAL
  Filled 2013-12-05: qty 16

## 2013-12-05 MED ORDER — TRAZODONE HCL 50 MG PO TABS
50.0000 mg | ORAL_TABLET | Freq: Every day | ORAL | Status: DC
  Administered 2013-12-05 – 2013-12-07 (×3): 50 mg via ORAL
  Filled 2013-12-05 (×4): qty 1

## 2013-12-05 MED ORDER — INSULIN ASPART 100 UNIT/ML ~~LOC~~ SOLN
0.0000 [IU] | Freq: Three times a day (TID) | SUBCUTANEOUS | Status: DC
  Administered 2013-12-05 – 2013-12-06 (×2): 3 [IU] via SUBCUTANEOUS
  Administered 2013-12-06: 2 [IU] via SUBCUTANEOUS
  Administered 2013-12-06: 1 [IU] via SUBCUTANEOUS
  Administered 2013-12-07 (×2): 2 [IU] via SUBCUTANEOUS
  Administered 2013-12-07: 3 [IU] via SUBCUTANEOUS
  Administered 2013-12-08 (×2): 2 [IU] via SUBCUTANEOUS

## 2013-12-05 MED ORDER — DEXTROSE 5 % IV SOLN
1.0000 g | Freq: Once | INTRAVENOUS | Status: AC
  Administered 2013-12-05: 1 g via INTRAVENOUS
  Filled 2013-12-05: qty 10

## 2013-12-05 MED ORDER — OXYCODONE HCL 5 MG PO TABS
5.0000 mg | ORAL_TABLET | Freq: Four times a day (QID) | ORAL | Status: DC | PRN
  Administered 2013-12-05 – 2013-12-08 (×9): 5 mg via ORAL
  Filled 2013-12-05 (×11): qty 1

## 2013-12-05 MED ORDER — POLYETHYLENE GLYCOL 3350 17 G PO PACK
17.0000 g | PACK | Freq: Two times a day (BID) | ORAL | Status: DC
  Administered 2013-12-05 – 2013-12-08 (×6): 17 g via ORAL
  Filled 2013-12-05 (×7): qty 1

## 2013-12-05 MED ORDER — BUSPIRONE HCL 5 MG PO TABS
5.0000 mg | ORAL_TABLET | Freq: Two times a day (BID) | ORAL | Status: DC
  Administered 2013-12-05 – 2013-12-08 (×6): 5 mg via ORAL
  Filled 2013-12-05 (×7): qty 1

## 2013-12-05 MED ORDER — ACETAMINOPHEN 325 MG PO TABS
650.0000 mg | ORAL_TABLET | Freq: Four times a day (QID) | ORAL | Status: DC | PRN
  Administered 2013-12-07: 650 mg via ORAL
  Filled 2013-12-05 (×2): qty 2

## 2013-12-05 MED ORDER — CARVEDILOL 12.5 MG PO TABS
12.5000 mg | ORAL_TABLET | Freq: Two times a day (BID) | ORAL | Status: DC
  Administered 2013-12-05 – 2013-12-08 (×6): 12.5 mg via ORAL
  Filled 2013-12-05 (×8): qty 1

## 2013-12-05 MED ORDER — ONDANSETRON HCL 4 MG PO TABS: 4.0000 mg | ORAL_TABLET | Freq: Four times a day (QID) | ORAL | Status: DC | PRN

## 2013-12-05 MED ORDER — ONDANSETRON HCL 4 MG/2ML IJ SOLN
4.0000 mg | Freq: Once | INTRAMUSCULAR | Status: AC
  Administered 2013-12-05: 4 mg via INTRAVENOUS
  Filled 2013-12-05: qty 2

## 2013-12-05 NOTE — ED Provider Notes (Addendum)
CSN: [id number]     Arrival date & time 12/05/13  0715 History   First MD Initiated Contact with Patient 12/05/13 [phone]     No chief complaint on file.  (Consider location/radiation/quality/duration/timing/severity/associated sxs/prior Treatment) HPI Comments: Patient presented with him plain of generalized weakness, intermittent right-sided pain and difficulty urinating.  Pt was recently at [facility] and d/c on Thursday after being treated for UTI.  She has been on levaquin since leaving the hospital but now for the last 2-3 days has had difficulty urinating where she only gets a few drops out and intermittent right sided pain.  She currently denies abd or back pain.  Also she states she feels generally weak however denies SOB, cough, fever, n/v/d.  Family states she has been eating and drinking well.  They deny AMS.  The history is provided by the patient.    Past Medical History  Diagnosis Date  . GERD (gastroesophageal reflux disease)   . Hiatal hernia   . HTN (hypertension)     all her life  . Atrial fibrillation     since 1996  . Depression   . Chronic diastolic heart failure 64/40/3474  . Tibia fracture     BILATERAL  . Respiratory failure     acute on chronic hx of  requiring BIPAP  . COPD (chronic obstructive pulmonary disease)   . Metabolic encephalopathy     hx of   . UTI (urinary tract infection)     hx of   . Pleural effusion     right sided now resolved   . CHF (congestive heart failure)     diastolic HF  . Complication of anesthesia     "hard to wake up" (03/11/2013)  . OSA on CPAP   . Type II diabetes mellitus 1980's?  . History of blood transfusion 08/2012    "related to leg OR" (03/11/2013)  . Arthritis     "left arm; lower back, hips, hands" (03/11/2013)  . Chronic lower back pain     "disc pops in and out" (03/11/2013)  . Gout   . Anxiety   . Fall 08/2012    "in nursing home" (03/11/2013)  . Anemia   . Chronic a-fib 03/12/2013  . CKD (chronic kidney  disease), stage III     GFR: 38  . Renal failure     acute on chronic   . Renal failure     acute on chronic with need for intermittent dialysis - hx of    Past Surgical History  Procedure Laterality Date  . Bunionectomy Bilateral     bilateral  . Cholecystectomy    . Shoulder arthroscopy w/ rotator cuff repair Left 2002  . Total knee arthroplasty Bilateral 2001  . Hammer toe surgery Bilateral 2004  . Colonoscopy  07/11/2011    Procedure: COLONOSCOPY;  Surgeon: Rogene Houston, MD;  Location: [facility];  Service: Endoscopy;  Laterality: N/A;  3:00  . Orif periprosthetic fracture  09/08/2012    Procedure: OPEN REDUCTION INTERNAL FIXATION (ORIF) PERIPROSTHETIC FRACTURE;  Surgeon: Mauri Pole, MD;  Location: [facility];  Service: Orthopedics;  Laterality: Right;  ORIF periprosthetic right proximal femur fracture   . External fixation leg  09/08/2012    Procedure: EXTERNAL FIXATION LEG;  Surgeon: Mauri Pole, MD;  Location: [facility];  Service: Orthopedics;  Laterality: Left;  . Joint replacement    . External fixation leg  11/30/2012    Procedure: EXTERNAL FIXATION LEG;  Surgeon:  Mauri Pole, MD;  Location: [facility];  Service: Orthopedics;  Laterality: Left;  Removal of External Fixation Left Knee with Evaluation with Floroscopy  . Tee without cardioversion N/A 01/19/2013    Procedure: TRANSESOPHAGEAL ECHOCARDIOGRAM (TEE);  Surgeon: Lelon Perla, MD;  Location: [facility];  Service: Cardiovascular;  Laterality: N/A;  . Shoulder open rotator cuff repair Left 2002    "maybe 3 months after scope" (03/11/2013)  . Femur fracture surgery Right 08/2012    "fell at nursing home" (03/11/2013)  . Tibia fracture surgery Left 08/2012    "fell at nursing home" (03/11/2013)   Family History  Problem Relation Age of Onset  . Colon cancer Brother    History  Substance Use Topics  . Smoking status: Never Smoker   . Smokeless tobacco: Never Used  . Alcohol Use: No   OB History   Grav Para Term  Preterm Abortions TAB SAB Ect Mult Living                 Review of Systems  Constitutional: Negative for fever.  Respiratory: Negative for cough and shortness of breath.   Cardiovascular: Positive for leg swelling. Negative for chest pain and palpitations.       Lower ext edema unchanged  Gastrointestinal: Negative for nausea and vomiting.  Genitourinary: Positive for urgency and frequency. Negative for flank pain.  All other systems reviewed and are negative.    Allergies  Morphine sulfate; Remeron; Tuna; and Penicillins  Home Medications   Current Outpatient Rx  Name  Route  Sig  Dispense  Refill  . acetaminophen (TYLENOL) 325 MG tablet   Oral   Take 650 mg by mouth every 6 (six) hours as needed for pain.         Marland Kitchen allopurinol (ZYLOPRIM) 100 MG tablet   Oral   Take 100 mg by mouth daily.         Marland Kitchen ALPRAZolam (XANAX) 0.25 MG tablet   Oral   Take 0.5 tablets (0.125 mg total) by mouth every 8 (eight) hours as needed for anxiety.   30 tablet   0   . amiodarone (PACERONE) 200 MG tablet   Oral   Take 1 tablet (200 mg total) by mouth daily.   30 tablet   3     Please file dosage change   . busPIRone (BUSPAR) 5 MG tablet   Oral   Take 1 tablet (5 mg total) by mouth every 12 (twelve) hours.   30 tablet   0   . carvedilol (COREG) 12.5 MG tablet   Oral   Take 1 tablet (12.5 mg total) by mouth 2 (two) times daily with a meal.   60 tablet   3   . docusate sodium (COLACE) 100 MG capsule   Oral   Take 100 mg by mouth daily.          . fluticasone (FLONASE) 50 MCG/ACT nasal spray   Nasal   Place 1 spray into the nose daily.         Marland Kitchen guaiFENesin (MUCINEX) 600 MG 12 hr tablet   Oral   Take 600 mg by mouth daily.          . insulin aspart (NOVOLOG) 100 UNIT/ML injection   Subcutaneous   Inject 0-15 Units into the skin 3 (three) times daily with meals.   1 vial   12     0-9 Units, Subcutaneous, 3 times daily with meals  ...   Marland Kitchen  insulin glargine  (LANTUS) 100 UNIT/ML injection   Subcutaneous   Inject 0.05 mLs (5 Units total) into the skin daily.   10 mL   12   . metolazone (ZAROXOLYN) 2.5 MG tablet      On Mondays, can take on Fri if needed         . mupirocin ointment (BACTROBAN) 2 %   Nasal   Place 1 application into the nose 2 (two) times daily.   22 g   0   . nitroGLYCERIN (NITROSTAT) 0.4 MG SL tablet   Sublingual   Place 0.4 mg under the tongue every 5 (five) minutes as needed for chest pain.         Marland Kitchen omeprazole (PRILOSEC) 20 MG capsule   Oral   Take 40 mg by mouth daily.          . ondansetron (ZOFRAN) 4 MG tablet   Oral   Take 1 tablet (4 mg total) by mouth every 6 (six) hours as needed for nausea.   20 tablet   0   . oxyCODONE (ROXICODONE) 5 MG immediate release tablet   Oral   Take 1 tablet (5 mg total) by mouth every 6 (six) hours as needed.   30 tablet   0   . polyethylene glycol (MIRALAX / GLYCOLAX) packet   Oral   Take 17 g by mouth 2 (two) times daily.          . potassium chloride SA (K-DUR,KLOR-CON) 20 MEQ tablet   Oral   Take 1 tablet (20 mEq total) by mouth daily.   30 tablet   0   . torsemide (DEMADEX) 20 MG tablet      If weight is 180 or less take 60 mg Twice daily, if weight is over 180 take 80 mg Twice daily   240 tablet   3     DOES NOT NEED FILLED AT THIS TIME, PLEASE FILE NEW ...   . traZODone (DESYREL) 50 MG tablet   Oral   Take 50 mg by mouth at bedtime.         Marland Kitchen warfarin (COUMADIN) 5 MG tablet   Oral   Take 0.5 tablets (2.5 mg total) by mouth daily. Or as directed   30 tablet   3    There were no vitals taken for this visit. Physical Exam  Nursing note and vitals reviewed. Constitutional: She is oriented to person, place, and time. She appears well-developed and well-nourished. No distress.  HENT:  Head: Normocephalic and atraumatic.  Mouth/Throat: Oropharynx is clear and moist.  Eyes: Conjunctivae and EOM are normal. Pupils are equal, round, and  reactive to light.  Neck: Normal range of motion. Neck supple.  Cardiovascular: Normal rate and intact distal pulses.  An irregularly irregular rhythm present.  Murmur heard. Pulmonary/Chest: Effort normal and breath sounds normal. No respiratory distress. She has no wheezes. She has no rales.  Abdominal: Soft. She exhibits no distension. There is tenderness in the right upper quadrant. There is no rebound, no guarding and no CVA tenderness.    Minimal RUQ pain  Musculoskeletal: Normal range of motion. She exhibits edema. She exhibits no tenderness.  1+ pitting edema bilateral lower extremities  Neurological: She is alert and oriented to person, place, and time.  Skin: Skin is warm and dry. No rash noted. No erythema.  Psychiatric: She has a normal mood and affect. Her behavior is normal.    ED Course  Procedures (including critical care  time) Labs Review Labs Reviewed  CBC WITH DIFFERENTIAL - Abnormal; Notable for the following:    HCT 35.4 (*)    Platelets 134 (*)    Neutrophils Relative % 79 (*)    Lymphocytes Relative 9 (*)    All other components within normal limits  COMPREHENSIVE METABOLIC PANEL - Abnormal; Notable for the following:    Sodium 122 (*)    Potassium 5.5 (*)    Chloride 80 (*)    Glucose, Bld 131 (*)    BUN 113 (*)    Creatinine, Ser 3.58 (*)    Albumin 3.2 (*)    Alkaline Phosphatase 162 (*)    GFR calc non Af Amer 11 (*)    GFR calc Af Amer 12 (*)    All other components within normal limits  URINALYSIS, ROUTINE W REFLEX MICROSCOPIC - Abnormal; Notable for the following:    APPearance CLOUDY (*)    Hgb urine dipstick TRACE (*)    Leukocytes, UA LARGE (*)    All other components within normal limits  PRO B NATRIURETIC PEPTIDE - Abnormal; Notable for the following:    Pro B Natriuretic peptide (BNP) 9189.0 (*)    All other components within normal limits  PROTIME-INR - Abnormal; Notable for the following:    Prothrombin Time 24.4 (*)    INR 2.28  (*)    All other components within normal limits  URINE MICROSCOPIC-ADD ON - Abnormal; Notable for the following:    Squamous Epithelial / LPF FEW (*)    Bacteria, UA MANY (*)    All other components within normal limits  URINE CULTURE  POCT I-STAT TROPONIN I   Imaging Review Dg Chest 2 View  12/05/2013   CLINICAL DATA:  Weakness, shortness of Breath.  EXAM: CHEST - 2 VIEW  COMPARISON:  10/22/2013  FINDINGS: Low lung volumes. New airspace opacities in the lung bases, right greater than left. Progressive pulmonary vascular congestion. Increase in right pleural effusion. Heart size difficult to assess due to adjacent opacities. . Minimal spurring in the lower thoracic spine.  IMPRESSION: 1. Worsening pulmonary vascular congestion and bibasilar airspace disease right greater than left. 2. Right pleural effusion, increased since previous exam   Electronically Signed   By: Arne Cleveland M.D.   On: 12/05/2013 09:41    EKG Interpretation    Date/Time:  Sunday December 05 2013 08:16:23 EST Ventricular Rate:  69 PR Interval:    QRS Duration: 122 QT Interval:  427 QTC Calculation: 457 R Axis:   -85 Text Interpretation:  Atrial fibrillation  Nonspecific IVCD with LAD Consider anterior infarct No significant change since last tracing Reconfirmed by Chayse Gracey  MD, Xoie Kreuser ([id number]) on 12/05/2013 10:14:23 AM            MDM   1. UTI (lower urinary tract infection)   2. Acute on chronic renal failure   3. CHF (congestive heart failure)     Patient with multiple medical problems including diabetes, chronic renal insufficiency, recent urinary tract infection, atrial fibrillation and CHF. Patient has recently been admitted and discharged on Thursday for her urinary tract infection however since that time she's had generalized weakness, difficulty urinating and intermittent right-sided pain. Nausea, vomiting, fever or dysuria. She is still taking Levaquin. Family states she is retaining some fluid but  only minimal swelling in her lower extremities which they state is her norm. Patient is answering questions appropriately and in no acute distress. Bedside ultrasound shows no sign of urinary  retention.  Concern for acute on chronic renal insufficiency versus persisting UTI versus exacerbation of CHF causing her weakness.  CBC, CMP, UA, BNP, INR, troponin, EKG pending. Currently patient states she is not in pain and does not need any medication at this time.  The fascia found to have worsening renal function with creatinine from 2.9 last week to 3.5 currently. Ongoing urinary tract infection with too numerous to count white blood cells. Also x-ray and BNP evidence of worsening congestive heart failure which is most likely a result of the acute on chronic renal failure. Patient currently is in no acute distress and satting normally. Give Rocephin as patient appears to be resistant to Levaquin and new cultures done. Patient will be admitted for further care.   Blanchie Dessert, MD 12/05/13 1018  Blanchie Dessert, MD 12/05/13 [place], MD 12/05/13 1046

## 2013-12-05 NOTE — Progress Notes (Signed)
NURSING PROGRESS NOTE  Gina Ashley [id number] Admission Data: 12/05/2013 7:57 PM Attending Provider: Barton Dubois, MD GUR:[id number] Weyman Croon, MD Code Status: Full   Gina Ashley is a 80 y.o. female patient admitted from ED:  -No acute distress noted.  -No complaints of shortness of breath.  -No complaints of chest pain.    Blood pressure 131/78, pulse 70, temperature 97.9 F (36.6 C), temperature source Oral, resp. rate 17, height 5\' 3"  (1.6 m), weight 85.1 kg (187 lb 9.8 oz), SpO2 96.00%.   IV Fluids:  IV in place, occlusive dsg intact without redness, IV cath forearm right, condition patent and no redness normal saline.   Allergies:  Morphine sulfate; Remeron; Tuna; and Penicillins  Past Medical History:   has a past medical history of GERD (gastroesophageal reflux disease); Hiatal hernia; HTN (hypertension); Atrial fibrillation; Depression; Chronic diastolic heart failure (15/17/6160); Tibia fracture; Respiratory failure; COPD (chronic obstructive pulmonary disease); Metabolic encephalopathy; UTI (urinary tract infection); Pleural effusion; CHF (congestive heart failure); Complication of anesthesia; OSA on CPAP; Type II diabetes mellitus (1980's?); History of blood transfusion (08/2012); Arthritis; Chronic lower back pain; Gout; Anxiety; Fall (08/2012); Anemia; Chronic a-fib (03/12/2013); CKD (chronic kidney disease), stage III; Renal failure; and Renal failure.  Past Surgical History:   has past surgical history that includes Bunionectomy (Bilateral); Cholecystectomy; Shoulder arthroscopy w/ rotator cuff repair (Left, 2002); Total knee arthroplasty (Bilateral, 2001); Hammer toe surgery (Bilateral, 2004); Colonoscopy (07/11/2011); ORIF periprosthetic fracture (09/08/2012); External fixation leg (09/08/2012); Joint replacement; External fixation leg (11/30/2012); TEE without cardioversion (N/A, 01/19/2013); Shoulder open rotator cuff repair (Left, 2002); Femur fracture surgery (Right, 08/2012);  and Tibia fracture surgery (Left, 08/2012).  Social History:   reports that she has never smoked. She has never used smokeless tobacco. She reports that she does not drink alcohol or use illicit drugs.  Skin: Intact; blanchable redness to sacrum.  Patient/Family orientated to room. Information packet given to patient/family. Admission inpatient armband information verified with patient/family to include name and date of birth and placed on patient arm. Side rails up x 2, fall assessment and education completed with patient/family. Patient/family able to verbalize understanding of risk associated with falls and verbalized understanding to call for assistance before getting out of bed. Call light within reach. Patient/family able to voice and demonstrate understanding of unit orientation instructions.    Will continue to evaluate and treat per MD orders.

## 2013-12-05 NOTE — H&P (Addendum)
[facility] History and Physical  Gina Ashley [id number] DOB: Aug 09, 1927 DOA: 12/05/2013  Referring physician: Dr. Theora Gianotti PCP: Reginia Naas, MD   Chief Complaint: dysuria, generalized weakness, decrease in urine output and anorexia  HPI: Andreah Grotte is a 80 y.o. female with pmh significant for CKD stage 4-5 (not candidate for HD); chronic diastolic heart failure, HTN, DM (on insulin); atrial fibrillation (on coumadin), depression and chronic resp failure on oxygen; came to ED complaining of anorexia, dysuria and generalized weakness. Patient treated at [facility] for UTI for approx 4 days and discharge on 1/29; continue experiencing dysuria and has been unable to eat or drink much after discussing with family. In ED found with hyponatremia, mild hyperkalemia, acute on chronic renal failure and UA suggesting UTI. CXR with vascular congestion and patient with elevated BNP. Of note, she denies CP, palpitations, fever, chills, hematuria, hematochezia, melena, SOB or any other complaints. Patient and family reports they are in the process of starting hospice care at home and would like only not invasive treatment.    Review of Systems:  Negative except as mentioned on HPI.  Past Medical History  Diagnosis Date  . GERD (gastroesophageal reflux disease)   . Hiatal hernia   . HTN (hypertension)     all her life  . Atrial fibrillation     since 1996  . Depression   . Chronic diastolic heart failure [id number]  . Tibia fracture     BILATERAL  . Respiratory failure     acute on chronic hx of  requiring BIPAP  . COPD (chronic obstructive pulmonary disease)   . Metabolic encephalopathy     hx of   . UTI (urinary tract infection)     hx of   . Pleural effusion     right sided now resolved   . CHF (congestive heart failure)     diastolic HF  . Complication of anesthesia     "hard to wake up" (03/11/2013)  . OSA on CPAP   . Type II diabetes mellitus 1980's?  . History of  blood transfusion 08/2012    "related to leg OR" (03/11/2013)  . Arthritis     "left arm; lower back, hips, hands" (03/11/2013)  . Chronic lower back pain     "disc pops in and out" (03/11/2013)  . Gout   . Anxiety   . Fall 08/2012    "in nursing home" (03/11/2013)  . Anemia   . Chronic a-fib 03/12/2013  . CKD (chronic kidney disease), stage III     GFR: 38  . Renal failure     acute on chronic   . Renal failure     acute on chronic with need for intermittent dialysis - hx of    Past Surgical History  Procedure Laterality Date  . Bunionectomy Bilateral     bilateral  . Cholecystectomy    . Shoulder arthroscopy w/ rotator cuff repair Left 2002  . Total knee arthroplasty Bilateral 2001  . Hammer toe surgery Bilateral 2004  . Colonoscopy  07/11/2011    Procedure: COLONOSCOPY;  Surgeon: Rogene Houston, MD;  Location: [facility];  Service: Endoscopy;  Laterality: N/A;  3:00  . Orif periprosthetic fracture  09/08/2012    Procedure: OPEN REDUCTION INTERNAL FIXATION (ORIF) PERIPROSTHETIC FRACTURE;  Surgeon: Mauri Pole, MD;  Location: [facility];  Service: Orthopedics;  Laterality: Right;  ORIF periprosthetic right proximal femur fracture   . External fixation leg  09/08/2012    Procedure: EXTERNAL  FIXATION LEG;  Surgeon: Mauri Pole, MD;  Location: [facility];  Service: Orthopedics;  Laterality: Left;  . Joint replacement    . External fixation leg  11/30/2012    Procedure: EXTERNAL FIXATION LEG;  Surgeon: Mauri Pole, MD;  Location: [facility];  Service: Orthopedics;  Laterality: Left;  Removal of External Fixation Left Knee with Evaluation with Floroscopy  . Tee without cardioversion N/A 01/19/2013    Procedure: TRANSESOPHAGEAL ECHOCARDIOGRAM (TEE);  Surgeon: Lelon Perla, MD;  Location: [place];  Service: Cardiovascular;  Laterality: N/A;  . Shoulder open rotator cuff repair Left 2002    "maybe 3 months after scope" (03/11/2013)  . Femur fracture surgery Right 08/2012    "fell at  nursing home" (03/11/2013)  . Tibia fracture surgery Left 08/2012    "fell at nursing home" (03/11/2013)   Social History:  reports that she has never smoked. She has never used smokeless tobacco. She reports that she does not drink alcohol or use illicit drugs.  Allergies  Allergen Reactions  . Morphine Sulfate Other (See Comments)    REACTION: change in personality  . Remeron [Mirtazapine] Other (See Comments)    Altered mental status, lethargy  . Geralyn Flash [Fish Allergy] Other (See Comments)    unknown  . Penicillins Rash    Tolerates Ancef.    Family History  Problem Relation Age of Onset  . Colon cancer Brother      Prior to Admission medications   Medication Sig Start Date End Date Taking? Authorizing Provider  acetaminophen (TYLENOL) 325 MG tablet Take 650 mg by mouth every 6 (six) hours as needed for pain.   Yes Historical Provider, MD  allopurinol (ZYLOPRIM) 100 MG tablet Take 100 mg by mouth daily.   Yes Historical Provider, MD  ALPRAZolam (XANAX) 0.25 MG tablet Take 0.5 tablets (0.125 mg total) by mouth every 8 (eight) hours as needed for anxiety. 10/25/13  Yes Kinnie Feil, MD  amiodarone (PACERONE) 200 MG tablet Take 1 tablet (200 mg total) by mouth daily. 10/25/13  Yes Kinnie Feil, MD  busPIRone (BUSPAR) 5 MG tablet Take 1 tablet (5 mg total) by mouth every 12 (twelve) hours. 10/25/13  Yes Kinnie Feil, MD  carvedilol (COREG) 12.5 MG tablet Take 1 tablet (12.5 mg total) by mouth 2 (two) times daily with a meal. 10/25/13  Yes Kinnie Feil, MD  docusate sodium (COLACE) 100 MG capsule Take 100 mg by mouth daily.    Yes Historical Provider, MD  fluticasone (FLONASE) 50 MCG/ACT nasal spray Place 1 spray into the nose daily.   Yes Historical Provider, MD  guaiFENesin (MUCINEX) 600 MG 12 hr tablet Take 600 mg by mouth daily.    Yes Historical Provider, MD  insulin aspart (NOVOLOG) 100 UNIT/ML injection Inject 0-15 Units into the skin 3 (three) times daily with meals.  10/25/13  Yes Kinnie Feil, MD  insulin glargine (LANTUS) 100 UNIT/ML injection Inject 0.05 mLs (5 Units total) into the skin daily. 10/25/13  Yes Kinnie Feil, MD  levofloxacin (LEVAQUIN) 250 MG tablet Take 250 mg by mouth every other day. For 3 doses. Took dose #2 12/04/13   Yes Historical Provider, MD  metolazone (ZAROXOLYN) 2.5 MG tablet Take 2.5 mg by mouth once a week. On Mondays, can take on Fri if needed 07/16/13  Yes Rande Brunt, NP  nitroGLYCERIN (NITROSTAT) 0.4 MG SL tablet Place 0.4 mg under the tongue every 5 (five) minutes as needed for chest pain.  Yes Historical Provider, MD  omeprazole (PRILOSEC) 40 MG capsule Take 40 mg by mouth daily.   Yes Historical Provider, MD  ondansetron (ZOFRAN) 4 MG tablet Take 1 tablet (4 mg total) by mouth every 6 (six) hours as needed for nausea. 10/25/13  Yes Kinnie Feil, MD  oxyCODONE (OXY IR/ROXICODONE) 5 MG immediate release tablet Take 5 mg by mouth every 6 (six) hours as needed for moderate pain or severe pain.   Yes Historical Provider, MD  polyethylene glycol (MIRALAX / GLYCOLAX) packet Take 17 g by mouth 2 (two) times daily.    Yes Historical Provider, MD  torsemide (DEMADEX) 20 MG tablet Take 60-80 mg by mouth 2 (two) times daily. Less than 180 pounds give 60mg  More than 180 pounds give 80mg    Yes Historical Provider, MD  traZODone (DESYREL) 50 MG tablet Take 50 mg by mouth at bedtime.   Yes Historical Provider, MD  warfarin (COUMADIN) 5 MG tablet Take 2.5 mg by mouth daily.   Yes Historical Provider, MD   Physical Exam: Filed Vitals:   12/05/13 1230  BP: 123/70  Pulse: 65  Resp: 14    BP 123/70  Pulse 65  Resp 14  Ht 5\' 3"  (1.6 m)  Wt 85.276 kg (188 lb)  BMI 33.31 kg/m2  SpO2 100%  General:  Appears calm and comfortable; dry MM, no erythema or exudates Eyes: PERRL, normal lids, irises & conjunctiva ENT: grossly normal hearing, no drainage out of ears or nostrils Neck: no LAD, masses or thyromegaly, no  JVD Cardiovascular: irregular, soft SEM, no rubs or gallops Respiratory: CTA bilaterally, no w/r/r. Normal respiratory effort. Abdomen: soft, nt, nd and positive BS Skin: no rash or induration seen on limited exam Musculoskeletal: grossly normal tone BUE/BLE; trace edema bilaterally Psychiatric: flat/depressed mood and affect, speech fluent and appropriate Neurologic: grossly non-focal.          Labs on Admission:  Basic Metabolic Panel:  Recent Labs Lab 12/05/13 0758  NA 122*  K 5.5*  CL 80*  CO2 31  GLUCOSE 131*  BUN 113*  CREATININE 3.58*  CALCIUM 9.3   Liver Function Tests:  Recent Labs Lab 12/05/13 0758  AST 29  ALT 20  ALKPHOS 162*  BILITOT 0.7  PROT 7.1  ALBUMIN 3.2*   CBC:  Recent Labs Lab 12/05/13 0758  WBC 9.4  NEUTROABS 7.4  HGB 12.1  HCT 35.4*  MCV 84.9  PLT 134*   BNP (last 3 results)  Recent Labs  07/05/13 1448 10/22/13 1628 12/05/13 0758  PROBNP 3388.0* 4942.0* 9189.0*   Radiological Exams on Admission: Dg Chest 2 View  12/05/2013   CLINICAL DATA:  Weakness, shortness of Breath.  EXAM: CHEST - 2 VIEW  COMPARISON:  10/22/2013  FINDINGS: Low lung volumes. New airspace opacities in the lung bases, right greater than left. Progressive pulmonary vascular congestion. Increase in right pleural effusion. Heart size difficult to assess due to adjacent opacities. . Minimal spurring in the lower thoracic spine.  IMPRESSION: 1. Worsening pulmonary vascular congestion and bibasilar airspace disease right greater than left. 2. Right pleural effusion, increased since previous exam   Electronically Signed   By: Arne Cleveland M.D.   On: 12/05/2013 09:41    EKG:  Ventricular Rate: 69  PR Interval:  QRS Duration: 122  QT Interval: 427  QTC Calculation: 457  R Axis: -85  Text Interpretation: Atrial fibrillation Nonspecific IVCD with LAD Consider anterior infarct No significant change since last tracing  Assessment/Plan 1-Partially  treated UTI  (lower urinary tract infection): patient recently admitted at [facility] with generalized weakness and UTI. Came complaining of ongoing dysuria, decrease urine output and anorexia. Records indicated Klebsiella microorganism (essentially pan-resistant) -case discussed with Dr, Linus Salmons ID and rec's given to hold on antibiotics treatments -will repeat urine culture  -no fever or elevated WBC's, no signs of sepsis -patient decrease urine output most likely from worsening renal function and beginning of anuria -she has not been eating and drinking properly and is dehydrated with intravascular depletion. -will provide gentle hydration and evaluate clinical response -discussed with family who understand patient health is actively declining and not much available to offer. They want not invasive treatment and comfort as main goal. -DNR, already applying for Hospice in outpatient setting to received equipment and services -will admit to med-surg, pain meds and antiemetics as needed. -if further comfort measures needed, will consult palliative care for assistance.  2-DM: patient last A1C 8.0; not eating much and kidney function is worse. -will use SSI  3-Anemia due to chronic illness: Hgb stable.   4-Chronic diastolic heart failure: CXR with worsening vascular congestion; but patient denies SOB and is dry on PE. -will monitor daily weight and strict I's and O's -gentle hydration -hold diuretics today  5-Depression and anxiety: stable. Continue home regimen  6-Chronic respiratory failure: continue home oxygen supplementation. Currently stable and at baseline.  7-Acute on chronic renal failure: due to continue use of diuretics, dehydration and UTI. Patient with stage 4-5 at baseline. -will check BMET in am  8-Hyperkalemia: no EKG changes for hyperkalemia; is 5.5 -will monitor for now (family will like to avoid kayexalate)  9-Hyponatremia: due to diuretics and dehydration. -will give gentle hydration  and will follow electrolytes  10-atrial fibrillation: currently rate controlled. After discussing with family will like to stop anticoagulation.  11-GERD: continue PPI   DVT: heparin.  Code Status: DNR Family Communication: daughter (POA) at bedside Disposition Plan: inpatient, LOS > 2 midnights, med-surg  Time spent: 76 minutes  [facility] Pager [phone]

## 2013-12-05 NOTE — ED Notes (Signed)
In and out cath completed x2 using sterile technique; assisted by Luellen Pucker, RN

## 2013-12-05 NOTE — ED Notes (Signed)
Pt reports decreased urination since being D/C from Hospital last week. Pt reports when she voids it is only a small amount of urine.

## 2013-12-05 NOTE — ED Notes (Signed)
MD at bedside. Admitting MD at bedside

## 2013-12-06 DIAGNOSIS — R627 Adult failure to thrive: Secondary | ICD-10-CM

## 2013-12-06 DIAGNOSIS — E871 Hypo-osmolality and hyponatremia: Secondary | ICD-10-CM

## 2013-12-06 LAB — BASIC METABOLIC PANEL
BUN: 117 mg/dL — ABNORMAL HIGH (ref 6–23)
CO2: 31 meq/L (ref 19–32)
Calcium: 9.1 mg/dL (ref 8.4–10.5)
Chloride: 84 mEq/L — ABNORMAL LOW (ref 96–112)
Creatinine, Ser: 3.28 mg/dL — ABNORMAL HIGH (ref 0.50–1.10)
GFR calc Af Amer: 14 mL/min — ABNORMAL LOW (ref >90)
GFR calc non Af Amer: 12 mL/min — ABNORMAL LOW (ref >90)
Glucose, Bld: 172 mg/dL — ABNORMAL HIGH (ref 70–99)
POTASSIUM: 4.8 meq/L (ref 3.7–5.3)
SODIUM: 129 meq/L — AB (ref 137–147)

## 2013-12-06 LAB — GLUCOSE, CAPILLARY
GLUCOSE-CAPILLARY: 166 mg/dL — AB (ref 70–99)
GLUCOSE-CAPILLARY: 172 mg/dL — AB (ref 70–99)
Glucose-Capillary: 226 mg/dL — ABNORMAL HIGH (ref 70–99)
Glucose-Capillary: 221 mg/dL — ABNORMAL HIGH (ref 70–99)

## 2013-12-06 LAB — PROTIME-INR
INR: 2.29 — ABNORMAL HIGH (ref 0.00–1.49)
Prothrombin Time: 24.5 seconds — ABNORMAL HIGH (ref 11.6–15.2)

## 2013-12-06 MED ORDER — WARFARIN SODIUM 2.5 MG PO TABS
2.5000 mg | ORAL_TABLET | Freq: Once | ORAL | Status: AC
  Administered 2013-12-06: 2.5 mg via ORAL
  Filled 2013-12-06 (×2): qty 1

## 2013-12-06 MED ORDER — WARFARIN - PHARMACIST DOSING INPATIENT: Freq: Every day | Status: DC

## 2013-12-06 MED ORDER — TORSEMIDE 20 MG PO TABS
80.0000 mg | ORAL_TABLET | Freq: Two times a day (BID) | ORAL | Status: DC
  Administered 2013-12-06 – 2013-12-08 (×4): 80 mg via ORAL
  Filled 2013-12-06 (×7): qty 4

## 2013-12-06 MED ORDER — METOLAZONE 2.5 MG PO TABS
2.5000 mg | ORAL_TABLET | Freq: Once | ORAL | Status: AC
  Administered 2013-12-06: 2.5 mg via ORAL
  Filled 2013-12-06 (×2): qty 1

## 2013-12-06 NOTE — Progress Notes (Signed)
UR completed. Andrena Margerum RN CCM Case Mgmt 

## 2013-12-06 NOTE — Progress Notes (Signed)
Explained COPD Gold card benefits to patient and patient accepted card [phone]

## 2013-12-06 NOTE — Progress Notes (Signed)
PATIENT DETAILS Name: Gina Ashley Age: 80 y.o. Sex: female Date of Birth: 1927/09/22 Admit Date: 12/05/2013 Admitting Physician No admitting provider for patient encounter. [id number] THIELE, MD  Subjective: No major complaints  Assessment/Plan: Principal Problem:   UTI (lower urinary tract infection) -await Urine culture -monitor off antibiotics -recently treated at [facility] for UTI  Hyponatremia -suspected 2/2 dehydration given poor oral intake. Suspect chronic hyponatremia at baseline from CHF. -good response to gentle hydration -stop IVF-has severe chronic Diastolic CHF-resume lasix and metolazone  Chronic Diastolic Heart Failure -resume lasix -daily weights -strict intake and output  Acute on CKD Stage 4 -creatinine close to usual baseline following hydration  -reviewed prior hospitalizations notes-per Renal not a candidate for HD  Chronic respiratory failure  continue home oxygen supplementation. Currently stable and at baseline.  Anemia due to chronic illness -Hgb stable-at baseline  Hyperkalemia -resolved -monitor lytes periodically  Atrial fibrillation - currently rate controlled. After discussing with daughter today, she would like Coumadin to be resumed.  -c/w amiodarone, coreg  DM -CBG's stable -SSI  GERD - continue PPI  Failure to Thrive -recurrent hospitalizations, poor functional status, worsening cardio-renal synd with deterioration of renal failure-not a HD candidate.  -PT eval -Nutriition eval -Palliative care eval-suspect good candidate for home hospice  Disposition: Remain inpatient  DVT Prophylaxis: Prophylactic Heparin  Code Status:  DNR  Family Communication Daughter at bedside  Procedures:  None  CONSULTS:  None  Time spent 40 minutes-which includes 50% of the time with face-to-face with patient/ family and coordinating care related to the above assessment and plan.  MEDICATIONS: Scheduled  Meds: . amiodarone  200 mg Oral Daily  . busPIRone  5 mg Oral Q12H  . carvedilol  12.5 mg Oral BID WC  . fluticasone  1 spray Each Nare Daily  . heparin  5,000 Units Subcutaneous Q8H  . insulin aspart  0-5 Units Subcutaneous QHS  . insulin aspart  0-9 Units Subcutaneous TID WC  . pantoprazole  40 mg Oral Daily  . polyethylene glycol  17 g Oral BID  . traZODone  50 mg Oral QHS   Continuous Infusions:  PRN Meds:.acetaminophen, acetaminophen, ALPRAZolam, HYDROmorphone (DILAUDID) injection, ondansetron (ZOFRAN) IV, ondansetron, oxyCODONE  Antibiotics: Anti-infectives   Start     Dose/Rate Route Frequency Ordered Stop   12/06/13 0000  cefTRIAXone (ROCEPHIN) 1 g in dextrose 5 % 50 mL IVPB  Status:  Discontinued     1 g 100 mL/hr over 30 Minutes Intravenous Every 24 hours 12/05/13 1442 12/05/13 1527   12/05/13 1045  cefTRIAXone (ROCEPHIN) 1 g in dextrose 5 % 50 mL IVPB     1 g 100 mL/hr over 30 Minutes Intravenous  Once 12/05/13 1039 12/05/13 1200       PHYSICAL EXAM: Vital signs in last 24 hours: Filed Vitals:   12/05/13 1451 12/05/13 2204 12/06/13 0518 12/06/13 0904  BP: 131/78 126/80 117/78 119/71  Pulse:  72 83 77  Temp:  97.8 F (36.6 C) 97.5 F (36.4 C)   TempSrc:  Oral Oral   Resp:  17 17   Height:      Weight:   85.3 kg (188 lb 0.8 oz)   SpO2:  96% 94%     Weight change:  Filed Weights   12/05/13 0815 12/05/13 1440 12/06/13 0518  Weight: 85.276 kg (188 lb) 85.1 kg (187 lb 9.8 oz) 85.3 kg (188 lb 0.8 oz)   Body mass index is 33.32 kg/(m^2).  Gen Exam: Awake and alert with clear speech.   Neck: Supple, No JVD.   Chest: bibasilar rales CVS: S1 S2 Regular, no murmurs.  Abdomen: soft, BS +, non tender, non distended. Extremities: +edema, lower extremities warm to touch. Neurologic: Non Focal-gen. weakness Skin: No Rash.   Wounds: N/A.    Intake/Output from previous day:  Intake/Output Summary (Last 24 hours) at 12/06/13 1337 Last data filed at 12/06/13  0657  Gross per 24 hour  Intake    240 ml  Output    300 ml  Net    -60 ml     LAB RESULTS: CBC  Recent Labs Lab 12/05/13 0758 12/05/13 1500  WBC 9.4 8.2  HGB 12.1 11.9*  HCT 35.4* 36.1  PLT 134* 111*  MCV 84.9 85.5  MCH 29.0 28.2  MCHC 34.2 33.0  RDW 14.6 14.7  LYMPHSABS 0.8  --   MONOABS 0.9  --   EOSABS 0.3  --   BASOSABS 0.0  --     Chemistries   Recent Labs Lab 12/05/13 0758 12/05/13 1500 12/06/13 0800  NA 122*  --  129*  K 5.5*  --  4.8  CL 80*  --  84*  CO2 31  --  31  GLUCOSE 131*  --  172*  BUN 113*  --  117*  CREATININE 3.58* 3.57* 3.28*  CALCIUM 9.3  --  9.1  MG  --  3.1*  --     CBG:  Recent Labs Lab 12/05/13 1803 12/05/13 2202 12/06/13 0801 12/06/13 1224  GLUCAP 203* 191* 166* 226*    GFR Estimated Creatinine Clearance: 13.2 ml/min (by C-G formula based on Cr of 3.28).  Coagulation profile  Recent Labs Lab 12/05/13 0758  INR 2.28*    Cardiac Enzymes No results found for this basename: CK, CKMB, TROPONINI, MYOGLOBIN,  in the last 168 hours  No components found with this basename: POCBNP,  No results found for this basename: DDIMER,  in the last 72 hours No results found for this basename: HGBA1C,  in the last 72 hours No results found for this basename: CHOL, HDL, LDLCALC, TRIG, CHOLHDL, LDLDIRECT,  in the last 72 hours No results found for this basename: TSH, T4TOTAL, FREET3, T3FREE, THYROIDAB,  in the last 72 hours No results found for this basename: VITAMINB12, FOLATE, FERRITIN, TIBC, IRON, RETICCTPCT,  in the last 72 hours No results found for this basename: LIPASE, AMYLASE,  in the last 72 hours  Urine Studies No results found for this basename: UACOL, UAPR, USPG, UPH, UTP, UGL, UKET, UBIL, UHGB, UNIT, UROB, ULEU, UEPI, UWBC, URBC, UBAC, CAST, CRYS, UCOM, BILUA,  in the last 72 hours  MICROBIOLOGY: Recent Results (from the past 240 hour(s))  URINE CULTURE     Status: None   Collection Time    12/05/13  8:37 AM       Result Value Range Status   Specimen Description URINE, RANDOM   Final   Special Requests NONE   Final   Culture  Setup Time     Final   Value: 12/05/2013 17:20     Performed at [facility]     Final   Value: >=100,000 COLONIES/ML     Performed at [facility]   Culture     Final   Value: [place]     Performed at [facility]   Report Status PENDING   Incomplete  MRSA PCR SCREENING  Status: None   Collection Time    12/05/13  2:47 PM      Result Value Range Status   MRSA by PCR NEGATIVE  NEGATIVE Final   Comment:            The GeneXpert MRSA Assay (FDA     approved for NASAL specimens     only), is one component of a     comprehensive MRSA colonization     surveillance program. It is not     intended to diagnose MRSA     infection nor to guide or     monitor treatment for     MRSA infections.    RADIOLOGY STUDIES/RESULTS: Dg Chest 2 View  12/05/2013   CLINICAL DATA:  Weakness, shortness of Breath.  EXAM: CHEST - 2 VIEW  COMPARISON:  10/22/2013  FINDINGS: Low lung volumes. New airspace opacities in the lung bases, right greater than left. Progressive pulmonary vascular congestion. Increase in right pleural effusion. Heart size difficult to assess due to adjacent opacities. . Minimal spurring in the lower thoracic spine.  IMPRESSION: 1. Worsening pulmonary vascular congestion and bibasilar airspace disease right greater than left. 2. Right pleural effusion, increased since previous exam   Electronically Signed   By: Arne Cleveland M.D.   On: 12/05/2013 09:41    Oren Binet, MD  [facility] Pager:[phone]  If 7PM-7AM, please contact night-coverage [email or web address] Password [email or web address] 12/06/2013, 1:37 PM   LOS: 1 day

## 2013-12-06 NOTE — Progress Notes (Signed)
Patient has been referred to [facility] for disease management due to repeat readmissions.  Noted the family is formally engaging hospice this admission and desires only noninvasive care.  Reached out to Caroline who confirmed Hospice plan.  [facility] will formally engage her care in the event that additional services are needed or the family decides not to elect Hospice care at this time.  RNCM agreed with plan and will refer patient back to [facility] accordingly.  Of note, [facility] Care Management services does not replace or interfere with any services that are arranged by inpatient case management or social work.  For additional questions or referrals please contact Corliss Blacker BSN RN [facility] Liaison at [phone].

## 2013-12-06 NOTE — Progress Notes (Signed)
ANTICOAGULATION CONSULT NOTE - Follow Up Consult  Pharmacy Consult for Coumadin Indication: atrial fibrillation  Allergies  Allergen Reactions  . Morphine Sulfate Other (See Comments)    REACTION: change in personality  . Remeron [Mirtazapine] Other (See Comments)    Altered mental status, lethargy  . Geralyn Flash [Fish Allergy] Other (See Comments)    unknown  . Penicillins Rash    Tolerates Ancef.    Patient Measurements: Height: 5\' 3"  (160 cm) Weight: 188 lb 0.8 oz (85.3 kg) IBW/kg (Calculated) : 52.4 Heparin Dosing Weight:   Vital Signs: Temp: 97.3 F (36.3 C) (02/02 1424) Temp src: Oral (02/02 1424) BP: 120/73 mmHg (02/02 1424) Pulse Rate: 74 (02/02 1424)  Labs:  Recent Labs  12/05/13 0758 12/05/13 1500 12/06/13 0800  HGB 12.1 11.9*  --   HCT 35.4* 36.1  --   PLT 134* 111*  --   LABPROT 24.4*  --   --   INR 2.28*  --   --   CREATININE 3.58* 3.57* 3.28*    Estimated Creatinine Clearance: 13.2 ml/min (by C-G formula based on Cr of 3.28).   Medications:  Scheduled:  . amiodarone  200 mg Oral Daily  . busPIRone  5 mg Oral Q12H  . carvedilol  12.5 mg Oral BID WC  . fluticasone  1 spray Each Nare Daily  . heparin  5,000 Units Subcutaneous Q8H  . insulin aspart  0-5 Units Subcutaneous QHS  . insulin aspart  0-9 Units Subcutaneous TID WC  . metolazone  2.5 mg Oral Once  . pantoprazole  40 mg Oral Daily  . polyethylene glycol  17 g Oral BID  . torsemide  80 mg Oral BID  . traZODone  50 mg Oral QHS    Assessment: 80yo female with AFib, to resume Coumadin.  INR 2.28 on 2/1, last dose was on 1/31.  No bleeding problems noted.  Hg 11.9 and pltc 111; no CBC today.  Goal of Therapy:  INR 2-3 Monitor platelets by anticoagulation protocol: Yes   Plan:  1-  Coumadin 2.5mg  today 2-  Daily INR  Gracy Bruins, PharmD Clinical Pharmacist [facility]

## 2013-12-06 NOTE — Progress Notes (Signed)
Patient is already under the care of [name]. Please contact them for further needs and goals. We will sign off per my discussion with Dr. Sloan Leiter. Thank you.  Vinie Sill, NP Palliative Medicine Team Pager # [phone] Team Phone # [phone]

## 2013-12-07 DIAGNOSIS — I509 Heart failure, unspecified: Secondary | ICD-10-CM

## 2013-12-07 LAB — GLUCOSE, CAPILLARY
Glucose-Capillary: 168 mg/dL — ABNORMAL HIGH (ref 70–99)
Glucose-Capillary: 194 mg/dL — ABNORMAL HIGH (ref 70–99)
Glucose-Capillary: 216 mg/dL — ABNORMAL HIGH (ref 70–99)
Glucose-Capillary: 224 mg/dL — ABNORMAL HIGH (ref 70–99)

## 2013-12-07 LAB — BASIC METABOLIC PANEL
BUN: 119 mg/dL — AB (ref 6–23)
CHLORIDE: 84 meq/L — AB (ref 96–112)
CO2: 31 mEq/L (ref 19–32)
Calcium: 8.8 mg/dL (ref 8.4–10.5)
Creatinine, Ser: 3.27 mg/dL — ABNORMAL HIGH (ref 0.50–1.10)
GFR calc non Af Amer: 12 mL/min — ABNORMAL LOW (ref >90)
GFR, EST AFRICAN AMERICAN: 14 mL/min — AB (ref >90)
Glucose, Bld: 172 mg/dL — ABNORMAL HIGH (ref 70–99)
Potassium: 3.9 mEq/L (ref 3.7–5.3)
Sodium: 128 mEq/L — ABNORMAL LOW (ref 137–147)

## 2013-12-07 LAB — PROTIME-INR
INR: 2.33 — ABNORMAL HIGH (ref 0.00–1.49)
PROTHROMBIN TIME: 24.8 s — AB (ref 11.6–15.2)

## 2013-12-07 LAB — URINE CULTURE

## 2013-12-07 MED ORDER — FLEET ENEMA 7-19 GM/118ML RE ENEM
1.0000 | ENEMA | Freq: Once | RECTAL | Status: AC
  Administered 2013-12-07: 1 via RECTAL
  Filled 2013-12-07: qty 1

## 2013-12-07 MED ORDER — ENSURE PUDDING PO PUDG
1.0000 | Freq: Two times a day (BID) | ORAL | Status: DC
  Administered 2013-12-08 (×2): 1 via ORAL

## 2013-12-07 MED ORDER — WARFARIN SODIUM 2.5 MG PO TABS
2.5000 mg | ORAL_TABLET | Freq: Once | ORAL | Status: AC
  Administered 2013-12-07: 2.5 mg via ORAL
  Filled 2013-12-07: qty 1

## 2013-12-07 MED ORDER — ALPRAZOLAM 0.25 MG PO TABS
0.1250 mg | ORAL_TABLET | Freq: Three times a day (TID) | ORAL | Status: AC | PRN
Start: 2013-12-07 — End: ?

## 2013-12-07 MED ORDER — SENNA 8.6 MG PO TABS: 2.0000 | ORAL_TABLET | Freq: Every day | ORAL | Status: AC

## 2013-12-07 MED ORDER — MORPHINE SULFATE (CONCENTRATE) 10 MG /0.5 ML PO SOLN: 5.0000 mg | ORAL | Status: AC | PRN

## 2013-12-07 NOTE — Progress Notes (Signed)
PATIENT DETAILS Name: Gina Ashley Age: 80 y.o. Sex: female Date of Birth: January 13, 1927 Admit Date: 12/05/2013 Admitting Physician No admitting provider for patient encounter. WUJ:WJXBJ,YNWGNFA THIELE, MD  Subjective: No major complaints.   Assessment/Plan: Principal Problem:   UTI (lower urinary tract infection) -await Urine culture -monitor off antibiotics -recently treated at [facility] for UTI -spoke with Lab/Microbiology-may be growing a ESBL organism-therefore will hold discharge and await further senisitivities.  Hyponatremia -suspected 2/2 dehydration given poor oral intake. Suspect chronic hyponatremia at baseline from CHF. -good response to gentle hydration -stop IVF-has severe chronic Diastolic CHF-has been resumed lasix and metolazone  Chronic Diastolic Heart Failure -c/w demadex -daily weights-weight decreased to 84 kg from 85.1 kg -strict intake and output  Acute on CKD Stage 4 -creatinine close to usual baseline following hydration  -reviewed prior hospitalizations notes-per Renal not a candidate for HD  Chronic respiratory failure  continue home oxygen supplementation. Currently stable and at baseline.  Anemia due to chronic illness -Hgb stable-at baseline  Hyperkalemia -resolved -monitor lytes periodically  Atrial fibrillation - currently rate controlled. After discussing with daughter,Coumadin resumed. Dosing per pharmacy -c/w amiodarone, coreg  DM -CBG's stable -SSI  GERD - continue PPI  Failure to Thrive -recurrent hospitalizations, poor functional status, worsening cardio-renal synd with deterioration of renal failure-not a HD candidate.  -PT eval -Nutriition eval -has already been hooked up to home hospice  Disposition: Remain inpatient  DVT Prophylaxis: Not needed as on warfarn  Code Status:  DNR  Family Communication Daughter at bedside  Procedures:  None  CONSULTS:  None  Time spent 40 minutes-which includes  50% of the time with face-to-face with patient/ family and coordinating care related to the above assessment and plan.  MEDICATIONS: Scheduled Meds: . amiodarone  200 mg Oral Daily  . busPIRone  5 mg Oral Q12H  . carvedilol  12.5 mg Oral BID WC  . fluticasone  1 spray Each Nare Daily  . heparin  5,000 Units Subcutaneous Q8H  . insulin aspart  0-5 Units Subcutaneous QHS  . insulin aspart  0-9 Units Subcutaneous TID WC  . pantoprazole  40 mg Oral Daily  . polyethylene glycol  17 g Oral BID  . torsemide  80 mg Oral BID  . traZODone  50 mg Oral QHS  . Warfarin - Pharmacist Dosing Inpatient   Does not apply q1800   Continuous Infusions:  PRN Meds:.acetaminophen, acetaminophen, ALPRAZolam, HYDROmorphone (DILAUDID) injection, ondansetron (ZOFRAN) IV, ondansetron, oxyCODONE  Antibiotics: Anti-infectives   Start     Dose/Rate Route Frequency Ordered Stop   12/06/13 0000  cefTRIAXone (ROCEPHIN) 1 g in dextrose 5 % 50 mL IVPB  Status:  Discontinued     1 g 100 mL/hr over 30 Minutes Intravenous Every 24 hours 12/05/13 1442 12/05/13 1527   12/05/13 1045  cefTRIAXone (ROCEPHIN) 1 g in dextrose 5 % 50 mL IVPB     1 g 100 mL/hr over 30 Minutes Intravenous  Once 12/05/13 1039 12/05/13 1200       PHYSICAL EXAM: Vital signs in last 24 hours: Filed Vitals:   12/06/13 2113 12/06/13 2302 12/07/13 0641 12/07/13 0756  BP: 101/62  113/75 132/92  Pulse: 70 81 72 68  Temp: 98.6 F (37 C)  97.8 F (36.6 C)   TempSrc: Oral  Oral   Resp: 16 16 18    Height:      Weight:   84 kg (185 lb 3 oz)   SpO2: 91% 95%  99%     Weight change: -1.276 kg (-2 lb 13 oz) Filed Weights   12/05/13 1440 12/06/13 0518 12/07/13 0641  Weight: 85.1 kg (187 lb 9.8 oz) 85.3 kg (188 lb 0.8 oz) 84 kg (185 lb 3 oz)   Body mass index is 32.81 kg/(m^2).   Gen Exam: Awake and alert with clear speech.   Neck: Supple, No JVD.   Chest: bibasilar rales CVS: S1 S2 Regular, no murmurs.  Abdomen: soft, BS +, non tender, non  distended. Extremities: +edema, lower extremities warm to touch. Neurologic: Non Focal-gen. weakness Skin: No Rash.   Wounds: N/A.    Intake/Output from previous day:  Intake/Output Summary (Last 24 hours) at 12/07/13 1213 Last data filed at 12/07/13 1010  Gross per 24 hour  Intake    240 ml  Output    350 ml  Net   -110 ml     LAB RESULTS: CBC  Recent Labs Lab 12/05/13 0758 12/05/13 1500  WBC 9.4 8.2  HGB 12.1 11.9*  HCT 35.4* 36.1  PLT 134* 111*  MCV 84.9 85.5  MCH 29.0 28.2  MCHC 34.2 33.0  RDW 14.6 14.7  LYMPHSABS 0.8  --   MONOABS 0.9  --   EOSABS 0.3  --   BASOSABS 0.0  --     Chemistries   Recent Labs Lab 12/05/13 0758 12/05/13 1500 12/06/13 0800 12/07/13 0549  NA 122*  --  129* 128*  K 5.5*  --  4.8 3.9  CL 80*  --  84* 84*  CO2 31  --  31 31  GLUCOSE 131*  --  172* 172*  BUN 113*  --  117* 119*  CREATININE 3.58* 3.57* 3.28* 3.27*  CALCIUM 9.3  --  9.1 8.8  MG  --  3.1*  --   --     CBG:  Recent Labs Lab 12/06/13 1224 12/06/13 1749 12/06/13 2207 12/07/13 0741 12/07/13 1136  GLUCAP 226* 172* 221* 168* 194*    GFR Estimated Creatinine Clearance: 13.1 ml/min (by C-G formula based on Cr of 3.27).  Coagulation profile  Recent Labs Lab 12/05/13 0758 12/06/13 1530 12/07/13 0549  INR 2.28* 2.29* 2.33*    Cardiac Enzymes No results found for this basename: CK, CKMB, TROPONINI, MYOGLOBIN,  in the last 168 hours  No components found with this basename: POCBNP,  No results found for this basename: DDIMER,  in the last 72 hours No results found for this basename: HGBA1C,  in the last 72 hours No results found for this basename: CHOL, HDL, LDLCALC, TRIG, CHOLHDL, LDLDIRECT,  in the last 72 hours No results found for this basename: TSH, T4TOTAL, FREET3, T3FREE, THYROIDAB,  in the last 72 hours No results found for this basename: VITAMINB12, FOLATE, FERRITIN, TIBC, IRON, RETICCTPCT,  in the last 72 hours No results found for this  basename: LIPASE, AMYLASE,  in the last 72 hours  Urine Studies No results found for this basename: UACOL, UAPR, USPG, UPH, UTP, UGL, UKET, UBIL, UHGB, UNIT, UROB, ULEU, UEPI, UWBC, URBC, UBAC, CAST, CRYS, UCOM, BILUA,  in the last 72 hours  MICROBIOLOGY: Recent Results (from the past 240 hour(s))  URINE CULTURE     Status: None   Collection Time    12/05/13  8:37 AM      Result Value Range Status   Specimen Description URINE, RANDOM   Final   Special Requests NONE   Final   Culture  Setup Time     Final  Value: 12/05/2013 17:20     Performed at [facility]     Final   Value: >=100,000 COLONIES/ML     Performed at [facility]   Culture     Final   Value: [place]     Performed at [facility]   Report Status PENDING   Incomplete  MRSA PCR SCREENING     Status: None   Collection Time    12/05/13  2:47 PM      Result Value Range Status   MRSA by PCR NEGATIVE  NEGATIVE Final   Comment:            The GeneXpert MRSA Assay (FDA     approved for NASAL specimens     only), is one component of a     comprehensive MRSA colonization     surveillance program. It is not     intended to diagnose MRSA     infection nor to guide or     monitor treatment for     MRSA infections.    RADIOLOGY STUDIES/RESULTS: Dg Chest 2 View  12/05/2013   CLINICAL DATA:  Weakness, shortness of Breath.  EXAM: CHEST - 2 VIEW  COMPARISON:  10/22/2013  FINDINGS: Low lung volumes. New airspace opacities in the lung bases, right greater than left. Progressive pulmonary vascular congestion. Increase in right pleural effusion. Heart size difficult to assess due to adjacent opacities. . Minimal spurring in the lower thoracic spine.  IMPRESSION: 1. Worsening pulmonary vascular congestion and bibasilar airspace disease right greater than left. 2. Right pleural effusion, increased since previous exam   Electronically Signed   By: Arne Cleveland M.D.   On: 12/05/2013  09:41    Oren Binet, MD  [facility] Pager:[phone]  If 7PM-7AM, please contact night-coverage [email or web address] Password [email or web address] 12/07/2013, 12:13 PM   LOS: 2 days

## 2013-12-07 NOTE — Progress Notes (Signed)
ANTICOAGULATION CONSULT NOTE - Follow Up Consult  Pharmacy Consult for Coumadin Indication: atrial fibrillation  Allergies  Allergen Reactions  . Morphine Sulfate Other (See Comments)    REACTION: change in personality  . Remeron [Mirtazapine] Other (See Comments)    Altered mental status, lethargy  . Geralyn Flash [Fish Allergy] Other (See Comments)    unknown  . Penicillins Rash    Tolerates Ancef.    Patient Measurements: Height: 5\' 3"  (160 cm) Weight: 185 lb 3 oz (84 kg) IBW/kg (Calculated) : 52.4  Vital Signs: Temp: 98.4 F (36.9 C) (02/03 1239) Temp src: Oral (02/03 1239) BP: 113/70 mmHg (02/03 1239) Pulse Rate: 78 (02/03 1239)  Labs:  Recent Labs  12/05/13 0758 12/05/13 1500 12/06/13 0800 12/06/13 1530 12/07/13 0549  HGB 12.1 11.9*  --   --   --   HCT 35.4* 36.1  --   --   --   PLT 134* 111*  --   --   --   LABPROT 24.4*  --   --  24.5* 24.8*  INR 2.28*  --   --  2.29* 2.33*  CREATININE 3.58* 3.57* 3.28*  --  3.27*    Estimated Creatinine Clearance: 13.1 ml/min (by C-G formula based on Cr of 3.27).   Medications:  Scheduled:  . amiodarone  200 mg Oral Daily  . busPIRone  5 mg Oral Q12H  . carvedilol  12.5 mg Oral BID WC  . fluticasone  1 spray Each Nare Daily  . insulin aspart  0-5 Units Subcutaneous QHS  . insulin aspart  0-9 Units Subcutaneous TID WC  . pantoprazole  40 mg Oral Daily  . polyethylene glycol  17 g Oral BID  . torsemide  80 mg Oral BID  . traZODone  50 mg Oral QHS  . warfarin  2.5 mg Oral ONCE-1800  . Warfarin - Pharmacist Dosing Inpatient   Does not apply q1800    Assessment: 80yo female with Afib on Coumadin.  INR is therapeutic today at 2.33. No bleeding noted, no CBC today.  Goal of Therapy:  INR 2-3 Monitor platelets by anticoagulation protocol: Yes   Plan:  -Coumadin 2.5 mg PO today -Daily INR -Monitor for s/sx of bleeding -Discontinue SQ heparin since INR >2  [facility], Pharm.D., BCPS Clinical Pharmacist Pager:  [phone] 12/07/2013 2:53 PM

## 2013-12-07 NOTE — Evaluation (Signed)
Physical Therapy Evaluation Patient Details Name: Gina Ashley MRN: [id number] DOB: February 19, 1927 Today's Date: 12/07/2013 Time: 5176-1607 PT Time Calculation (min): 21 min  PT Assessment / Plan / Recommendation History of Present Illness  Pt admit with UTI and COPD.    Clinical Impression  Pt admitted with above. Pt currently with functional limitations due to the deficits listed below (see PT Problem List). Feel pt would benefit from NH stay as pt has had multiple admissions in last 6 months and is much weaker on this admit.  This would decr the burden of care for daughters as well.  Pt in agreement.  Pt will benefit from skilled PT to increase their independence and safety with mobility to allow discharge to the venue listed below.     PT Assessment  Patient needs continued PT services    Follow Up Recommendations  SNF;Supervision/Assistance - 24 hour                Equipment Recommendations  Other (comment) (TBA)         Frequency Min 3X/week    Precautions / Restrictions Precautions Precautions: Fall Required Braces or Orthoses: Other Brace/Splint (metal upright left LE) Restrictions Weight Bearing Restrictions: No   Pertinent Vitals/Pain VSS, no pain      Mobility  Transfers Overall transfer level: Needs assistance Equipment used: Rolling walker (2 wheeled) Transfers: Sit to/from [facility] Sit to Stand: Mod assist;+2 physical assistance Stand pivot transfers: Mod assist;+2 physical assistance General transfer comment: Pt needs assist to rise.  Once up, pt with right knee > left knee buckling but both knees buckling.  Had a lot of difficulty with the stand and pivot transfer.  Pt transferred to the 3N1 and back.  Decided against ambulation secondary to knee buckling.  Asked pt if family had to assist this much at home and pt stated no that she is much weaker now.           PT Diagnosis: Generalized weakness  PT Problem List: Decreased activity  tolerance;Decreased balance;Decreased mobility;Decreased knowledge of use of DME;Decreased safety awareness;Decreased knowledge of precautions PT Treatment Interventions: DME instruction;Gait training;Functional mobility training;Therapeutic activities;Therapeutic exercise;Balance training;Patient/family education     PT Goals(Current goals can be found in the care plan section) Acute Rehab PT Goals Patient Stated Goal: to get therapy and then go home PT Goal Formulation: With patient Time For Goal Achievement: 12/21/13 Potential to Achieve Goals: Good  Visit Information  Last PT Received On: 12/07/13 Assistance Needed: +2 History of Present Illness: Pt admit with UTI and COPD.         Prior [place] expects to be discharged to:: Private residence Living Arrangements: Children Available Help at Discharge: Family;Available 24 hours/day Type of Home: House Home Access: Level entry Home Layout: One level Home Equipment: Walker - 2 wheels;Bedside commode;Wheelchair - manual;Hospital bed Additional Comments: Pt was at home receiving [facility] therapies([facility]), walked short distances with her walker.Admits that she uses her wheelchair more now than she used to. Prior Function Level of Independence: Needs assistance Gait / Transfers Assistance Needed: independent to modified I ADL's / Homemaking Assistance Needed: assist Communication / Swallowing Assistance Needed: Takes sponge baths Comments: daughters are in the medical field Communication Communication: No difficulties Dominant Hand: Right    Cognition  Cognition Arousal/Alertness: Awake/alert Behavior During Therapy: WFL for tasks assessed/performed Overall Cognitive Status: History of cognitive impairments - at baseline Memory: Decreased short-term memory    Extremity/Trunk Assessment Upper Extremity  Assessment Upper Extremity Assessment: Defer to OT evaluation Lower Extremity  Assessment Lower Extremity Assessment: Generalized weakness   Balance Balance Overall balance assessment: Needs assistance;History of Falls Postural control: Posterior lean Standing balance support: Bilateral upper extremity supported;During functional activity Standing balance-Leahy Scale: Poor Standing balance comment: Pt has difficulty with standing with RW as pt with bil knee buckling right LE worse than left LE.  Total asssit to clean pts bottom after she had BM on 3N1.    End of Session PT - End of Session Equipment Utilized During Treatment: Oxygen;Gait belt Activity Tolerance: Patient limited by fatigue Patient left: in chair;with call bell/phone within reach Nurse Communication: Mobility status;Need for lift equipment       INGOLD,Liylah Najarro 12/07/2013, 4:09 PM [facility] [phone] (pager)

## 2013-12-07 NOTE — Progress Notes (Signed)
INITIAL NUTRITION ASSESSMENT  DOCUMENTATION CODES Per approved criteria  -Obese   INTERVENTION: Recommend removal of renal restrictions, to regular, liberalized diet. Add Ensure Pudding po BID, each supplement provides 170 kcal and 4 grams of protein. RD to continue to follow nutrition care plan.  NUTRITION DIAGNOSIS: Inadequate oral intake related to variable appetite as evidenced by variable po intake.  Goal: Intake to meet >90% of estimated nutrition needs.  Monitor:  weight trends, lab trends, I/O's, PO intake, supplement tolerance  Reason for Assessment: Malnutrition Screening Tool  80 y.o. female  Admitting Dx: UTI (lower urinary tract infection)  ASSESSMENT: PMHx significant for CKD 4-5 (not HD candidate), chronic HF, DM, afib, depression and chronic respiratory failure. Admitted with anorexia, dysuria and generalized weaknes. Work-up reveals hyponatremia, mild hyperkalemia, acute on chronic renal failure and UTI.  Per chart, pt's family would not like invasive treatment and comfort is the main goal.  Pt with 7.5% wt loss x 5 months, this is significant for time frame. Pt is currently ordered for Renal diet and is eating 25% of meals. Pt is unable to tell me any nutrition hx at this time. Potassium and phos WNL.  Nutrition Focused Physical Exam:   Subcutaneous Fat:  Orbital Region: WNL  Upper Arm Region: WNL  Thoracic and Lumbar Region: WNL   Muscle:  Temple Region: WNL  Clavicle Bone Region: WNL  Clavicle and Acromion Bone Region: WNL  Scapular Bone Region: n/a Dorsal Hand: WNL  Patellar Region: WNL  Anterior Thigh Region: WNL  Posterior Calf Region: WNL   Edema: + edema  Pt is at nutrition risk 2/2 advanced age.  Height: Ht Readings from Last 1 Encounters:  12/05/13 5\' 3"  (1.6 m)    Weight: Wt Readings from Last 1 Encounters:  12/07/13 185 lb 3 oz (84 kg)    Ideal Body Weight: 115 lb  % Ideal Body Weight: 161%  Wt Readings from Last 10  Encounters:  12/07/13 185 lb 3 oz (84 kg)  10/25/13 181 lb 14.1 oz (82.5 kg)  10/11/13 190 lb 4 oz (86.297 kg)  09/22/13 186 lb 8 oz (84.596 kg)  08/31/13 182 lb 12 oz (82.895 kg)  08/18/13 181 lb (82.101 kg)  07/27/13 192 lb (87.091 kg)  07/14/13 207 lb 12.8 oz (94.257 kg)  07/08/13 198 lb 6.4 oz (89.994 kg)  07/07/13 197 lb 8.5 oz (89.6 kg)    Usual Body Weight: 200 lb  % Usual Body Weight: 92.5%  BMI:  Body mass index is 32.81 kg/(m^2). Obese Class I  Estimated Nutritional Needs: Kcal: 1500 - 1700 Protein: 50 - 60 g Fluid: 1.5 - 1.7 liters  Skin: intact  Diet Order: Renal 80-90; 1200 ml fluid restriction  EDUCATION NEEDS: -No education needs identified at this time   Intake/Output Summary (Last 24 hours) at 12/07/13 1625 Last data filed at 12/07/13 1500  Gross per 24 hour  Intake    900 ml  Output    350 ml  Net    550 ml    Last BM: 2/1 (diarrhea)  Labs:   Recent Labs Lab 12/05/13 0758 12/05/13 1500 12/06/13 0800 12/07/13 0549  NA 122*  --  129* 128*  K 5.5*  --  4.8 3.9  CL 80*  --  84* 84*  CO2 31  --  31 31  BUN 113*  --  117* 119*  CREATININE 3.58* 3.57* 3.28* 3.27*  CALCIUM 9.3  --  9.1 8.8  MG  --  3.1*  --   --  PHOS  --  3.4  --   --   GLUCOSE 131*  --  172* 172*    CBG (last 3)   Recent Labs  12/06/13 2207 12/07/13 0741 12/07/13 1136  GLUCAP 221* 168* 194*    Scheduled Meds: . amiodarone  200 mg Oral Daily  . busPIRone  5 mg Oral Q12H  . carvedilol  12.5 mg Oral BID WC  . fluticasone  1 spray Each Nare Daily  . insulin aspart  0-5 Units Subcutaneous QHS  . insulin aspart  0-9 Units Subcutaneous TID WC  . pantoprazole  40 mg Oral Daily  . polyethylene glycol  17 g Oral BID  . torsemide  80 mg Oral BID  . traZODone  50 mg Oral QHS  . warfarin  2.5 mg Oral ONCE-1800  . Warfarin - Pharmacist Dosing Inpatient   Does not apply q1800    Continuous Infusions:   Past Medical History  Diagnosis Date  . GERD  (gastroesophageal reflux disease)   . Hiatal hernia   . HTN (hypertension)     all her life  . Atrial fibrillation     since 1996  . Depression   . Chronic diastolic heart failure 73/71/0626  . Tibia fracture     BILATERAL  . Respiratory failure     acute on chronic hx of  requiring BIPAP  . COPD (chronic obstructive pulmonary disease)   . Metabolic encephalopathy     hx of   . UTI (urinary tract infection)     hx of   . Pleural effusion     right sided now resolved   . CHF (congestive heart failure)     diastolic HF  . Complication of anesthesia     "hard to wake up" (03/11/2013)  . OSA on CPAP   . Type II diabetes mellitus 1980's?  . History of blood transfusion 08/2012    "related to leg OR" (03/11/2013)  . Arthritis     "left arm; lower back, hips, hands" (03/11/2013)  . Chronic lower back pain     "disc pops in and out" (03/11/2013)  . Gout   . Anxiety   . Fall 08/2012    "in nursing home" (03/11/2013)  . Anemia   . Chronic a-fib 03/12/2013  . CKD (chronic kidney disease), stage III     GFR: 38  . Renal failure     acute on chronic   . Renal failure     acute on chronic with need for intermittent dialysis - hx of     Past Surgical History  Procedure Laterality Date  . Bunionectomy Bilateral     bilateral  . Cholecystectomy    . Shoulder arthroscopy w/ rotator cuff repair Left 2002  . Total knee arthroplasty Bilateral 2001  . Hammer toe surgery Bilateral 2004  . Colonoscopy  07/11/2011    Procedure: COLONOSCOPY;  Surgeon: Rogene Houston, MD;  Location: [facility];  Service: Endoscopy;  Laterality: N/A;  3:00  . Orif periprosthetic fracture  09/08/2012    Procedure: OPEN REDUCTION INTERNAL FIXATION (ORIF) PERIPROSTHETIC FRACTURE;  Surgeon: Mauri Pole, MD;  Location: [facility];  Service: Orthopedics;  Laterality: Right;  ORIF periprosthetic right proximal femur fracture   . External fixation leg  09/08/2012    Procedure: EXTERNAL FIXATION LEG;  Surgeon: Mauri Pole, MD;  Location: [facility];  Service: Orthopedics;  Laterality: Left;  . Joint replacement    . External fixation leg  11/30/2012    Procedure: EXTERNAL FIXATION LEG;  Surgeon: Mauri Pole, MD;  Location: [facility];  Service: Orthopedics;  Laterality: Left;  Removal of External Fixation Left Knee with Evaluation with Floroscopy  . Tee without cardioversion N/A 01/19/2013    Procedure: TRANSESOPHAGEAL ECHOCARDIOGRAM (TEE);  Surgeon: Lelon Perla, MD;  Location: [place];  Service: Cardiovascular;  Laterality: N/A;  . Shoulder open rotator cuff repair Left 2002    "maybe 3 months after scope" (03/11/2013)  . Femur fracture surgery Right 08/2012    "fell at nursing home" (03/11/2013)  . Tibia fracture surgery Left 08/2012    "fell at nursing home" (03/11/2013)    Inda Coke MS, RD, LDN Pager: [phone] After-hours pager: [phone]

## 2013-12-07 NOTE — Care Management Note (Addendum)
    Page 1 of 2   12/08/2013     2:58:17 PM   CARE MANAGEMENT NOTE 12/08/2013  Patient:  [facility]   Account Number:  [id number]  Date Initiated:  12/07/2013  Documentation initiated by:  Tomi Bamberger  Subjective/Objective Assessment:   dx uti  admit- active with [facility].     Action/Plan:   Anticipated DC Date:  12/08/2013   Anticipated DC Plan:  [place]  CM consult      PAC Choice  HOSPICE  Resumption Of Svcs/PTA Provider   Choice offered to / List presented to:          [facility] arranged  HH-1 RN      [facility] agency  [facility] [place]   Status of service:  Completed, signed off Medicare Important Message given?   (If response is "NO", the following Medicare IM given date fields will be blank) Date Medicare IM given:   Date Additional Medicare IM given:    Discharge Disposition:  [place]  Per UR Regulation:  Reviewed for med. necessity/level of care/duration of stay  If discussed at [place] of Stay Meetings, dates discussed:    Comments:  12/08/13 14:57 Tomi Bamberger RN ,BSN [phone] patient dc to home with daughter , Marcene Brawn with [facility] notified.  12/07/13 10:52 Tomi Bamberger RN, BSN [phone] Paitent is for dc today back home with [facility] [place].  NCM called Kara at [phone] at [facility] to notify of patient being dc today.  NCM will fax h/p and dc summary to [facility].  NCM spoke with daughter, Wilder Glade and she is here at the hospital and states that they will transport patient by car. NCM faxed dc summary and h/p.  Daughter has patient's oxygen tanks to transport with her to go home.

## 2013-12-08 ENCOUNTER — Encounter (HOSPITAL_COMMUNITY): Payer: Medicare Other

## 2013-12-08 DIAGNOSIS — D638 Anemia in other chronic diseases classified elsewhere: Secondary | ICD-10-CM

## 2013-12-08 DIAGNOSIS — I5033 Acute on chronic diastolic (congestive) heart failure: Secondary | ICD-10-CM

## 2013-12-08 LAB — GLUCOSE, CAPILLARY
GLUCOSE-CAPILLARY: 183 mg/dL — AB (ref 70–99)
Glucose-Capillary: 152 mg/dL — ABNORMAL HIGH (ref 70–99)
Glucose-Capillary: 159 mg/dL — ABNORMAL HIGH (ref 70–99)

## 2013-12-08 LAB — BASIC METABOLIC PANEL
BUN: 121 mg/dL — AB (ref 6–23)
CO2: 32 mEq/L (ref 19–32)
CREATININE: 3.26 mg/dL — AB (ref 0.50–1.10)
Calcium: 9.2 mg/dL (ref 8.4–10.5)
Chloride: 83 mEq/L — ABNORMAL LOW (ref 96–112)
GFR calc Af Amer: 14 mL/min — ABNORMAL LOW (ref >90)
GFR, EST NON AFRICAN AMERICAN: 12 mL/min — AB (ref >90)
Glucose, Bld: 145 mg/dL — ABNORMAL HIGH (ref 70–99)
Potassium: 3.3 mEq/L — ABNORMAL LOW (ref 3.7–5.3)
Sodium: 132 mEq/L — ABNORMAL LOW (ref 137–147)

## 2013-12-08 LAB — PROTIME-INR
INR: 2.22 — ABNORMAL HIGH (ref 0.00–1.49)
PROTHROMBIN TIME: 23.9 s — AB (ref 11.6–15.2)

## 2013-12-08 MED ORDER — FOSFOMYCIN TROMETHAMINE 3 G PO PACK
3.0000 g | PACK | Freq: Once | ORAL | Status: DC
  Filled 2013-12-08: qty 3

## 2013-12-08 MED ORDER — WARFARIN SODIUM 2.5 MG PO TABS
2.5000 mg | ORAL_TABLET | Freq: Every day | ORAL | Status: DC
  Filled 2013-12-08: qty 1

## 2013-12-08 MED ORDER — FOSFOMYCIN TROMETHAMINE 3 G PO PACK: 3.0000 g | PACK | Freq: Once | ORAL | Status: AC

## 2013-12-08 MED ORDER — POTASSIUM CHLORIDE CRYS ER 20 MEQ PO TBCR
60.0000 meq | EXTENDED_RELEASE_TABLET | Freq: Once | ORAL | Status: AC
  Administered 2013-12-08: 60 meq via ORAL
  Filled 2013-12-08: qty 3

## 2013-12-08 NOTE — Progress Notes (Signed)
NURSING PROGRESS NOTE  Gina Ashley [id number] Discharge Data: 12/08/2013 2:37 PM Attending Provider: Verlee Monte, MD [id number] Weyman Croon, MD     Andrez Grime to be D/C'd Home per MD order.  Discussed with the patient the After Visit Summary and all questions fully answered. All IV's discontinued with no bleeding noted. All belongings returned to patient for patient to take home.   Last Vital Signs:  Blood pressure 104/70, pulse 84, temperature 97.9 F (36.6 C), temperature source Oral, resp. rate 18, height 5\' 3"  (1.6 m), weight 82.7 kg (182 lb 5.1 oz), SpO2 93.00%.  Discharge Medication List   Medication List    STOP taking these medications       docusate sodium 100 MG capsule  Commonly known as:  COLACE     levofloxacin 250 MG tablet  Commonly known as:  LEVAQUIN     oxyCODONE 5 MG immediate release tablet  Commonly known as:  Oxy IR/ROXICODONE      TAKE these medications       acetaminophen 325 MG tablet  Commonly known as:  TYLENOL  Take 650 mg by mouth every 6 (six) hours as needed for pain.     allopurinol 100 MG tablet  Commonly known as:  ZYLOPRIM  Take 100 mg by mouth daily.     ALPRAZolam 0.25 MG tablet  Commonly known as:  XANAX  Take 0.5 tablets (0.125 mg total) by mouth every 8 (eight) hours as needed for anxiety.     amiodarone 200 MG tablet  Commonly known as:  PACERONE  Take 1 tablet (200 mg total) by mouth daily.     busPIRone 5 MG tablet  Commonly known as:  BUSPAR  Take 1 tablet (5 mg total) by mouth every 12 (twelve) hours.     carvedilol 12.5 MG tablet  Commonly known as:  COREG  Take 1 tablet (12.5 mg total) by mouth 2 (two) times daily with a meal.     fluticasone 50 MCG/ACT nasal spray  Commonly known as:  FLONASE  Place 1 spray into the nose daily.     fosfomycin 3 G Pack  Commonly known as:  MONUROL  Take 3 g by mouth once.  Start taking on:  12/11/2013     guaiFENesin 600 MG 12 hr tablet  Commonly known as:  MUCINEX   Take 600 mg by mouth daily.     insulin aspart 100 UNIT/ML injection  Commonly known as:  novoLOG  Inject 0-15 Units into the skin 3 (three) times daily with meals.     insulin glargine 100 UNIT/ML injection  Commonly known as:  LANTUS  Inject 0.05 mLs (5 Units total) into the skin daily.     metolazone 2.5 MG tablet  Commonly known as:  ZAROXOLYN  Take 2.5 mg by mouth once a week. On Mondays, can take on Fri if needed     morphine CONCENTRATE 10 mg / 0.5 ml concentrated solution  Take 0.25 mLs (5 mg total) by mouth every 2 (two) hours as needed for severe pain.     nitroGLYCERIN 0.4 MG SL tablet  Commonly known as:  NITROSTAT  Place 0.4 mg under the tongue every 5 (five) minutes as needed for chest pain.     omeprazole 40 MG capsule  Commonly known as:  PRILOSEC  Take 40 mg by mouth daily.     ondansetron 4 MG tablet  Commonly known as:  ZOFRAN  Take 1 tablet (4 mg total) by mouth every  6 (six) hours as needed for nausea.     polyethylene glycol packet  Commonly known as:  MIRALAX / GLYCOLAX  Take 17 g by mouth 2 (two) times daily.     senna 8.6 MG Tabs tablet  Commonly known as:  SENOKOT  Take 2 tablets (17.2 mg total) by mouth at bedtime.     torsemide 20 MG tablet  Commonly known as:  DEMADEX  - Take 60-80 mg by mouth 2 (two) times daily. Less than 180 pounds give 60mg   - More than 180 pounds give 80mg      traZODone 50 MG tablet  Commonly known as:  DESYREL  Take 50 mg by mouth at bedtime.     warfarin 5 MG tablet  Commonly known as:  COUMADIN  Take 2.5 mg by mouth daily.

## 2013-12-08 NOTE — Discharge Summary (Signed)
Physician Discharge Summary  Gina Ashley [id number] DOB: [birth year] DOA: 12/05/2013  PCP: Reginia Naas, MD  Admit date: 12/05/2013 Discharge date: 12/08/2013  Time spent: 40 minutes  Recommendations for Outpatient Follow-up:  1. Followup with primary care physician within one week.  Discharge Diagnoses:  Principal Problem:   UTI (lower urinary tract infection) Active Problems:   DM   Anemia due to chronic illness   Chronic diastolic heart failure   Depression   Chronic respiratory failure   Acute on chronic renal failure   Hyperkalemia   Hyponatremia   Discharge Condition: Stable  Diet recommendation: Heart Healthy diet  Filed Weights   12/06/13 0518 12/07/13 0641 12/08/13 0500  Weight: 85.3 kg (188 lb 0.8 oz) 84 kg (185 lb 3 oz) 82.7 kg (182 lb 5.1 oz)    History of present illness:  Gina Ashley is a 80 y.o. female with pmh significant for CKD stage 4-5 (not candidate for HD); chronic diastolic heart failure, HTN, DM (on insulin); atrial fibrillation (on coumadin), depression and chronic resp failure on oxygen; came to ED complaining of anorexia, dysuria and generalized weakness. Patient treated at [facility] for UTI for approx 4 days and discharge on 1/29; continue experiencing dysuria and has been unable to eat or drink much after discussing with family. In ED found with hyponatremia, mild hyperkalemia, acute on chronic renal failure and UA suggesting UTI. CXR with vascular congestion and patient with elevated BNP.  Of note, she denies CP, palpitations, fever, chills, hematuria, hematochezia, melena, SOB or any other complaints.  Patient and family reports they are in the process of starting hospice care at home and would like only not invasive treatment.  Hospital Course:   1. UTI: Patient was treated recently no much result hospital for UTI, she came in to the hospital with dysuria unable to eat or drink. Urinalysis done in the emergency department and was  consistent with UTI, but the antibiotic was not started secondary to MDR bacteria. The culture was still pending at the time of discharge, patient has previous 3 culture showing MDR Klebsiella which is susceptible to doxycycline. Discussion with pharmacy indicates that doxycycline/tetracycline has very low concentration in the urine and it might not be suitable for UTI treatment. Decision was made to treat patient was fosfomycin 3 g once today and repeat the dose in 3 days.  2. Hyponatremia: Suspected secondary to dehydration and poor oral intake, patient also likely to have chronic hyponatremia from CHF and chronic kidney disease. Her diuretics held for one day and started on IV fluids, and the sodium increased 131. Patient restarted back on her diuretics at the time of discharge.  3. Chronic diastolic CHF: Patient is on Demadex, daily weights and intake and output was strictly monitored. Her diuretics were held for one day and restarted prior to discharge.  4. CKD stage IV: Patient is on diuretics including Demadex and metolazone, came in to the hospital with creatinine of 3.58, first were held and patient was started on IV fluids for one day, the day of discharge diuretics restarted back again.  5. Chronic respiratory failure: Continue home supplementation, current be stable and baseline.  6. Atrial fibrillation currently her rate is controlled, after discussing with daughter, and Coumadin resumed. Continue home medications including amiodarone and Coreg.  7. Failure to thrive: Recurrent hospitalizations secondary to treated to thrive, cardiac and renal complications. Poor functional status. Not a candidate for hemodialysis. Patient already on home hospice, PT evaluated the patient and recommended skilled nursing  facility, patient family (her daughter) wanted to take her home with hospice.  Procedures:   none   Consultations:   none  Discharge Exam: Filed Vitals:   12/08/13 0431  BP:  104/70  Pulse: 84  Temp: 97.9 F (36.6 C)  Resp: 18   General: Alert and awake, oriented x3, not in any acute distress. HEENT: anicteric sclera, pupils reactive to light and accommodation, EOMI CVS: S1-S2 clear, no murmur rubs or gallops Chest: clear to auscultation bilaterally, no wheezing, rales or rhonchi Abdomen: soft nontender, nondistended, normal bowel sounds, no organomegaly Extremities: no cyanosis, clubbing or edema noted bilaterally Neuro: Cranial nerves II-XII intact, no focal neurological deficits  Discharge Instructions  Discharge Orders   Future Appointments Provider Department Dept Phone   12/10/2013 1:30 PM Wallene Huh, [place] [facility] [place]   12/14/2013 10:00 AM [facility] [phone]   Future Orders Complete By Expires   Diet - low sodium heart healthy  As directed    Increase activity slowly  As directed    Increase activity slowly  As directed        Medication List    STOP taking these medications       docusate sodium 100 MG capsule  Commonly known as:  COLACE     levofloxacin 250 MG tablet  Commonly known as:  LEVAQUIN     oxyCODONE 5 MG immediate release tablet  Commonly known as:  Oxy IR/ROXICODONE      TAKE these medications       acetaminophen 325 MG tablet  Commonly known as:  TYLENOL  Take 650 mg by mouth every 6 (six) hours as needed for pain.     allopurinol 100 MG tablet  Commonly known as:  ZYLOPRIM  Take 100 mg by mouth daily.     ALPRAZolam 0.25 MG tablet  Commonly known as:  XANAX  Take 0.5 tablets (0.125 mg total) by mouth every 8 (eight) hours as needed for anxiety.     amiodarone 200 MG tablet  Commonly known as:  PACERONE  Take 1 tablet (200 mg total) by mouth daily.     busPIRone 5 MG tablet  Commonly known as:  BUSPAR  Take 1 tablet (5 mg total) by mouth every 12 (twelve) hours.     carvedilol 12.5 MG tablet  Commonly known  as:  COREG  Take 1 tablet (12.5 mg total) by mouth 2 (two) times daily with a meal.     fluticasone 50 MCG/ACT nasal spray  Commonly known as:  FLONASE  Place 1 spray into the nose daily.     fosfomycin 3 G Pack  Commonly known as:  MONUROL  Take 3 g by mouth once.  Start taking on:  12/11/2013     guaiFENesin 600 MG 12 hr tablet  Commonly known as:  MUCINEX  Take 600 mg by mouth daily.     insulin aspart 100 UNIT/ML injection  Commonly known as:  novoLOG  Inject 0-15 Units into the skin 3 (three) times daily with meals.     insulin glargine 100 UNIT/ML injection  Commonly known as:  LANTUS  Inject 0.05 mLs (5 Units total) into the skin daily.     metolazone 2.5 MG tablet  Commonly known as:  ZAROXOLYN  Take 2.5 mg by mouth once a week. On Mondays, can take on Fri if needed     morphine CONCENTRATE 10 mg / 0.5  ml concentrated solution  Take 0.25 mLs (5 mg total) by mouth every 2 (two) hours as needed for severe pain.     nitroGLYCERIN 0.4 MG SL tablet  Commonly known as:  NITROSTAT  Place 0.4 mg under the tongue every 5 (five) minutes as needed for chest pain.     omeprazole 40 MG capsule  Commonly known as:  PRILOSEC  Take 40 mg by mouth daily.     ondansetron 4 MG tablet  Commonly known as:  ZOFRAN  Take 1 tablet (4 mg total) by mouth every 6 (six) hours as needed for nausea.     polyethylene glycol packet  Commonly known as:  MIRALAX / GLYCOLAX  Take 17 g by mouth 2 (two) times daily.     senna 8.6 MG Tabs tablet  Commonly known as:  SENOKOT  Take 2 tablets (17.2 mg total) by mouth at bedtime.     torsemide 20 MG tablet  Commonly known as:  DEMADEX  - Take 60-80 mg by mouth 2 (two) times daily. Less than 180 pounds give 60mg   - More than 180 pounds give 80mg      traZODone 50 MG tablet  Commonly known as:  DESYREL  Take 50 mg by mouth at bedtime.     warfarin 5 MG tablet  Commonly known as:  COUMADIN  Take 2.5 mg by mouth daily.       Allergies   Allergen Reactions  . Morphine Sulfate Other (See Comments)    REACTION: change in personality  . Remeron [Mirtazapine] Other (See Comments)    Altered mental status, lethargy  . Geralyn Flash [Fish Allergy] Other (See Comments)    unknown  . Penicillins Rash    Tolerates Ancef.       Follow-up Information   Follow up with Reginia Naas, MD. Schedule an appointment as soon as possible for a visit in 1 week.   Specialty:  Family Medicine   Contact information:   [address], [place] [address] [phone]       Follow up with [facility] On 12/14/2013. (appt at 10 am)    Contact information:   [address] [place] [address] [phone]      Follow up with Reginia Naas, MD In 1 week.   Specialty:  Family Medicine   Contact information:   [address], [place] [address] [phone]        The results of significant diagnostics from this hospitalization (including imaging, microbiology, ancillary and laboratory) are listed below for reference.    Significant Diagnostic Studies: Dg Chest 2 View  12/05/2013   CLINICAL DATA:  Weakness, shortness of Breath.  EXAM: CHEST - 2 VIEW  COMPARISON:  10/22/2013  FINDINGS: Low lung volumes. New airspace opacities in the lung bases, right greater than left. Progressive pulmonary vascular congestion. Increase in right pleural effusion. Heart size difficult to assess due to adjacent opacities. . Minimal spurring in the lower thoracic spine.  IMPRESSION: 1. Worsening pulmonary vascular congestion and bibasilar airspace disease right greater than left. 2. Right pleural effusion, increased since previous exam   Electronically Signed   By: Arne Cleveland M.D.   On: 12/05/2013 09:41    Microbiology: Recent Results (from the past 240 hour(s))  URINE CULTURE     Status: None   Collection Time    12/05/13  8:37 AM      Result Value Range Status   Specimen Description URINE,  RANDOM   Final   Special Requests NONE   Final   Culture  Setup Time     Final   Value: 12/05/2013 17:20     Performed at [employer]     Final   Value: >=100,000 COLONIES/ML     Performed at [facility]   Culture     Final   Value: [employer]     Performed at [facility]   Report Status PENDING   Incomplete  MRSA PCR SCREENING     Status: None   Collection Time    12/05/13  2:47 PM      Result Value Range Status   MRSA by PCR NEGATIVE  NEGATIVE Final   Comment:            The GeneXpert MRSA Assay (FDA     approved for NASAL specimens     only), is one component of a     comprehensive MRSA colonization     surveillance program. It is not     intended to diagnose MRSA     infection nor to guide or     monitor treatment for     MRSA infections.     Labs: Basic Metabolic Panel:  Recent Labs Lab 12/05/13 0758 12/05/13 1500 12/06/13 0800 12/07/13 0549 12/08/13 0500  NA 122*  --  129* 128* 132*  K 5.5*  --  4.8 3.9 3.3*  CL 80*  --  84* 84* 83*  CO2 31  --  31 31 32  GLUCOSE 131*  --  172* 172* 145*  BUN 113*  --  117* 119* 121*  CREATININE 3.58* 3.57* 3.28* 3.27* 3.26*  CALCIUM 9.3  --  9.1 8.8 9.2  MG  --  3.1*  --   --   --   PHOS  --  3.4  --   --   --    Liver Function Tests:  Recent Labs Lab 12/05/13 0758  AST 29  ALT 20  ALKPHOS 162*  BILITOT 0.7  PROT 7.1  ALBUMIN 3.2*   No results found for this basename: LIPASE, AMYLASE,  in the last 168 hours No results found for this basename: AMMONIA,  in the last 168 hours CBC:  Recent Labs Lab 12/05/13 0758 12/05/13 1500  WBC 9.4 8.2  NEUTROABS 7.4  --   HGB 12.1 11.9*  HCT 35.4* 36.1  MCV 84.9 85.5  PLT 134* 111*   Cardiac Enzymes: No results found for this basename: CKTOTAL, CKMB, CKMBINDEX, TROPONINI,  in the last 168 hours BNP: BNP (last 3 results)  Recent Labs  07/05/13 1448 10/22/13 1628 12/05/13 0758  PROBNP 3388.0* 4942.0* 9189.0*    CBG:  Recent Labs Lab 12/07/13 1136 12/07/13 1732 12/07/13 2057 12/08/13 0020 12/08/13 0756  GLUCAP 194* 216* 224* 159* 152*       Signed:  Manpreet Strey A  [facility] 12/08/2013, 11:42 AM

## 2013-12-08 NOTE — Progress Notes (Signed)
ANTICOAGULATION CONSULT NOTE - Follow Up Consult  Pharmacy Consult for Coumadin Indication: atrial fibrillation  Allergies  Allergen Reactions  . Morphine Sulfate Other (See Comments)    REACTION: change in personality  . Remeron [Mirtazapine] Other (See Comments)    Altered mental status, lethargy  . Geralyn Flash [Fish Allergy] Other (See Comments)    unknown  . Penicillins Rash    Tolerates Ancef.    Patient Measurements: Height: 5\' 3"  (160 cm) Weight: 182 lb 5.1 oz (82.7 kg) IBW/kg (Calculated) : 52.4  Vital Signs: Temp: 97.9 F (36.6 C) (02/04 0431) Temp src: Oral (02/04 0431) BP: 104/70 mmHg (02/04 0431) Pulse Rate: 84 (02/04 0431)  Labs:  Recent Labs  12/05/13 1500 12/06/13 0800 12/06/13 1530 12/07/13 0549 12/08/13 0500  HGB 11.9*  --   --   --   --   HCT 36.1  --   --   --   --   PLT 111*  --   --   --   --   LABPROT  --   --  24.5* 24.8* 23.9*  INR  --   --  2.29* 2.33* 2.22*  CREATININE 3.57* 3.28*  --  3.27* 3.26*    Estimated Creatinine Clearance: 13.1 ml/min (by C-G formula based on Cr of 3.26).   Medications:  Scheduled:  . amiodarone  200 mg Oral Daily  . busPIRone  5 mg Oral Q12H  . carvedilol  12.5 mg Oral BID WC  . feeding supplement (ENSURE)  1 Container Oral BID BM  . fluticasone  1 spray Each Nare Daily  . fosfomycin  3 g Oral Once  . insulin aspart  0-5 Units Subcutaneous QHS  . insulin aspart  0-9 Units Subcutaneous TID WC  . pantoprazole  40 mg Oral Daily  . polyethylene glycol  17 g Oral BID  . torsemide  80 mg Oral BID  . traZODone  50 mg Oral QHS  . warfarin  2.5 mg Oral q1800  . Warfarin - Pharmacist Dosing Inpatient   Does not apply q1800    Assessment: 80yo female with Afib on Coumadin.  INR is therapeutic today at 2.22. No bleeding noted, no CBC today.  Update: Called micro lab regarding urine culture. She has a MDR Klebsiella pneumoniae growing that is resistant to all antibiotics tested. She has had this organism before  going back to March 2014. Discussed with Minh, [place] pharmacist and fosfomycin is best option at this point. Currently she is afebrile and WBC are normal.   Goal of Therapy:  INR 2-3 Monitor platelets by anticoagulation protocol: Yes   Plan:  -Coumadin 2.5 mg PO daily -Daily INR -Monitor for s/sx of bleeding  [facility], Pharm.D., BCPS Clinical Pharmacist Pager: [phone] 12/08/2013 11:07 AM

## 2013-12-09 LAB — URINE CULTURE: Colony Count: >=100000

## 2013-12-10 ENCOUNTER — Ambulatory Visit: Payer: Medicare Other | Admitting: Podiatry

## 2013-12-14 ENCOUNTER — Inpatient Hospital Stay (HOSPITAL_COMMUNITY): Admit: 2013-12-14 | Payer: Medicare Other

## 2013-12-14 ENCOUNTER — Encounter (HOSPITAL_COMMUNITY): Payer: Self-pay

## 2013-12-16 ENCOUNTER — Ambulatory Visit: Payer: Self-pay | Admitting: Pharmacist

## 2013-12-16 DIAGNOSIS — Z7901 Long term (current) use of anticoagulants: Secondary | ICD-10-CM

## 2013-12-31 ENCOUNTER — Ambulatory Visit: Payer: Medicare Other | Admitting: Podiatry

## 2014-01-02 DEATH — deceased

## 2014-01-06 ENCOUNTER — Encounter: Payer: Self-pay | Admitting: Internal Medicine

## 2014-11-17 ENCOUNTER — Encounter (HOSPITAL_COMMUNITY): Payer: Self-pay | Admitting: Orthopedic Surgery

## 2015-02-25 NOTE — Consult Note (Signed)
General Aspect Gina Ashley is a 81yo female w/ PMHx s/f HFpEF, permanent atrial fibrillation (chronic Coumadin anticoagulation), CKD (stage IV, baseline Cr 2.6-3.0), OSA on CPAP, COPD and DM2 admitted to [facility] yesterday for hyponatremia. Cardiology consulted for CHF.   She is followed by Dr. Haroldine Laws at the HF clinic. Known diastolic CHF. Multiple admissions at [facility] last year (5x in 6 months) for volume overload, pleural effusion req thoracentesis, fall w/ tibia/fibula fracture, weakness from UTI. Felt not to be a candidate for HD by nephrology. Last seen in clinic 10/2013. 180 lbs is her dry weight. Torsemide adjusted based on this (to take 60 BID for weight </= 180 lbs, 80 BID for >180 lbs). She was 190 lbs at that time. Continued on metolazone 2.5 qweek. She was ultimately admitted mid December for A/C dias CHF, improved w/ IV diuresis. Discharged on same home meds.  2D echo 01/2013: EF 55-60%, restrict AoV mobility, mild MR, mod-severe biatrial enlargement, mod RV systolic dysfunction, ? LAA thrombus, moderate TR  She presents w/ progressive weakness. Typically able to ambulate around home with walker. Most recently w/ difficulty standing. She reports weight has been stable ~ 180 lbs. She watches her salt and fluid intake. Continues to take diuretics as prescribed. Notes chronic orthopnea. Wears CPAP at home. Endorses chronic DOE, unchanged from baseline. No LE edema or chest pain. Her weakness/fatigue worsened, thus prompting her ED presentation.   Present Illness In the ED, EKG revealed rate-controlled atrial fibrillation. Initial TnI WNL. No BNP. CXR- cariomegaly, mild atelectasis. Weight 180 lbs. Na 117. BUN 93/Cr 3.22. U/a- 3+ LEs. INR 1.8. She was admitted by the medicine service, started on IVF hydration. Na 123 today. BUN 92/Cr 2.88. K 3.2. TSH WNL. INR 2.1. CBC largely WNL. Symptoms improved.  PAST MEDICAL HISTORY: 1.  History of CHF.   2.  Hypertension.  3.  GERD. 4.  Depression.   5.  Atrial fibrillation/flutter.  6.  Anemia.  7.  Diabetes.  8.  History of CKD.    ALLERGIES: MORPHINE, PENICILLIN, REMERON AND TUNA.   MEDICATIONS AT HOME: 1.  Tylenol 650 mg p.o. every 6 hours as needed.   2.  Allopurinol 100 mg p.o. daily.  3.  Alprazolam 0.25 mg 1/2 tablet p.o. every 8 hours as needed.  4.  Amiodarone 200 mg p.o. daily.  5.  Buspirone 5 mg p.o. b.i.d.  6.  Coreg 12.5 mg p.o. b.i.d.  7.  Colace 100 mg p.o. daily.  8.  Flonase 1 spray intranasally daily.  9.  Guaifenesin 600 mg p.o. daily.  10.  Insulin Aspart sliding scale as needed.  11.  Insulin Lantus 5 units subcu daily.  12.  Metolazone 2.5 mg p.o. every Monday and Friday as needed.  13.  MiraLax twice a day.  14.  Mupirocin 2% topically to affected area twice a day.  15.  Nitroglycerin 0.4 mg sublingual every 5 minutes as needed.  16.  Omeprazole 40 mg p.o. daily.  17.  Oxycodone 5 mg p.o. every 6 hours as needed.  18.  Potassium chloride 20 mEq p.o. daily.  19.  Torsemide 20 mg if weight is above 180.  20.  Trazodone 50 mg p.o. at bedtime.  21.  Warfarin 5 mg 1/2 tablet p.o. daily.  22.  Zofran 4 mg p.o. q.6 hours needed.   SOCIAL HISTORY: No smoking, alcohol or IV drugs of abuse.   FAMILY HISTORY: Positive for coronary artery disease in the mother.   Physical  Exam:  GEN no acute distress, obese   HEENT pink conjunctivae, PERRL, hearing intact to voice   NECK supple  No masses  trachea midline  JVP to earlobes   RESP normal resp effort  clear BS  no use of accessory muscles  no wheezing, rales or rhonchi   CARD Irregular rate and rhythm  Normal, S1, S2  III/VI systolic crescendo-decrescendo murmur at RUSB   ABD denies tenderness  soft  hypoactive BS   EXTR negative cyanosis/clubbing, negative edema   SKIN normal to palpation   NEURO follows commands, motor/sensory function intact   PSYCH alert, A+O to time, place, person   Review of Systems:  Subjective/Chief Complaint  weakness, fatigue   General: Fatigue  Weakness   Cardiovascular: Orthopnea  DOE   Review of Systems: All other systems were reviewed and found to be negative   Lab Results:  Thyroid:  27-Jan-15 04:43   Thyroid Stimulating Hormone 0.460 (0.45-4.50 (International Unit)  ----------------------- Pregnant patients have  different reference  ranges for TSH:  - - - - - - - - - -  Pregnant, first trimetser:  0.36 - 2.50 uIU/mL)  Hepatic:  27-Jan-15 04:43   Albumin, Serum  2.9  Routine Chem:  26-Jan-15 13:41   BUN  93  Creatinine (comp)  3.22  Sodium, Serum  117  Potassium, Serum 3.9    17:18   BUN  95  Creatinine (comp)  2.95  Sodium, Serum  119  Potassium, Serum  3.4  27-Jan-15 04:43   BUN  92  Creatinine (comp)  2.88  Sodium, Serum  123  Potassium, Serum  3.2  Cardiac:  26-Jan-15 13:41   Troponin I < 0.02 (0.00-0.05 0.05 ng/mL or less: NEGATIVE  Repeat testing in 3-6 hrs  if clinically indicated. >0.05 ng/mL: POTENTIAL  MYOCARDIAL INJURY. Repeat  testing in 3-6 hrs if  clinically indicated. NOTE: An increase or decrease  of 30% or more on serial  testing suggests a  clinically important change)  CK, Total 52  CPK-MB, Serum  < 0.5 (Result(s) reported on 29 Nov 2013 at 04:03PM.)  Routine Coag:  26-Jan-15 13:41   INR 1.8 (INR reference interval applies to patients on anticoagulant therapy. A single INR therapeutic range for coumarins is not optimal for all indications; however, the suggested range for most indications is 2.0 - 3.0. Exceptions to the INR Reference Range may include: Prosthetic heart valves, acute myocardial infarction, prevention of myocardial infarction, and combinations of aspirin and anticoagulant. The need for a higher or lower target INR must be assessed individually. Reference: The Pharmacology and Management of the Vitamin K  antagonists: the seventh ACCP Conference on Antithrombotic and Thrombolytic Therapy. SAYTK.1601 Sept:126  (3suppl): N9146842. A HCT value >55% may artifactually increase the PT.  In one study,  the increase was an average of 25%. Reference:  "Effect on Routine and Special Coagulation Testing Values of Citrate Anticoagulant Adjustment in Patients with High HCT Values." American Journal of Clinical Pathology 2006;126:400-405.)  27-Jan-15 04:43   INR 2.1 (INR reference interval applies to patients on anticoagulant therapy. A single INR therapeutic range for coumarins is not optimal for all indications; however, the suggested range for most indications is 2.0 - 3.0. Exceptions to the INR Reference Range may include: Prosthetic heart valves, acute myocardial infarction, prevention of myocardial infarction, and combinations of aspirin and anticoagulant. The need for a higher or lower target INR must be assessed individually. Reference: The Pharmacology and Management of the Vitamin K  antagonists: the seventh ACCP Conference on Antithrombotic and Thrombolytic Therapy. FMMCR.7543 Sept:126 (3suppl): [id number]. A HCT value >55% may artifactually increase the PT.  In one study,  the increase was an average of 25%. Reference:  "Effect on Routine and Special Coagulation Testing Values of Citrate Anticoagulant Adjustment in Patients with High HCT Values." American Journal of Clinical Pathology 2006;126:400-405.)   EKG:  Interpretation atrial fibrillation, RBBB, LAD, poor R wave progression, no ST/T changes   Rate 77   EKG Comparision Not changed from  10/2013 tracing   Radiology Results: XRay:    26-Jan-15 16:08, Chest Portable Single View  Chest Portable Single View   REASON FOR EXAM:    weakness, hx of chf  COMMENTS:       PROCEDURE: DXR - DXR PORTABLE CHEST SINGLE VIEW  - Nov 29 2013  4:08PM     CLINICAL DATA:  Weakness.    EXAM:  PORTABLE CHEST - 1 VIEW    COMPARISON:  None.    FINDINGS:  The heart is moderately enlarged. There is elevation of the right  hemidiaphragm with right  basilar atelectasis identified. No overt  pulmonary edema or pleural effusions are identified.     IMPRESSION:  Cardiomegaly and right basilar atelectasis.      Electronically Signed    By: Aletta Edouard M.D.    On: 11/29/2013 16:23         Verified By: Azzie Roup, M.D.,    Morphine: Alt Ment Status  Remeron: Alt Ment Status  Penicillin: Rash  tuna: Unknown  Vital Signs/Nurse's Notes: **Vital Signs.:   27-Jan-15 08:57  Vital Signs Type Q 4hr  Temperature Temperature (F) 98.3  Celsius 36.8  Temperature Source oral  Pulse Pulse 71  Respirations Respirations 18  Systolic BP Systolic BP 606  Diastolic BP (mmHg) Diastolic BP (mmHg) 78  Mean BP 93  Pulse Ox % Pulse Ox % 98  Pulse Ox Activity Level  At rest  Oxygen Delivery 2L  *Intake and Output.:   Daily 27-Jan-15 07:00  Grand Totals Intake:  1052 Output:      Net:  61 24 Hr.:  1052  IV (Primary)      In:  1052  Length of Stay Totals Intake:  1052 Output:      Net:  9    Impression 81yo female w/ PMHx s/f HFpEF, permanent atrial fibrillation (chronic Coumadin anticoagulation), CKD (stage IV, baseline Cr 2.6-3.0), OSA on CPAP, COPD and DM2 admitted to [facility] yesterday for hyponatremia. Cardiology consulted for CHF.   1. Severe hyponatremia Improving w/ IVF hydration. Suspect dehydration from UTI, concomitant diuretic use, age contributing to weakness/fatigue. -- Management per primary team  2. UTI Evident on u/a. Accounting for weakness/fatigue.  -- Started on empiric abx per primary team  3. Acute on CKD, stage IV Secondary to dehydration, concomitant diuretic use. Improving w/ IVF hydration. Previous baseline Cr noted to be 2.2. Not a candidate for HD.  -- IVF hydration per primary team. Will need ongoing volume monitoring w/ this.   4. Heart failure w/ preserved EF (HFpEF) This appears to be fairly stable. DOE, orthopnea are chronic and unchanged. She was at her dry weight yesterday at 180lbs, w/  evidence of dehydration. Fairly euvolemic on exam. Lungs clear on exam. + JVD, however she does have moderate TR as well. No peripheral edema appreciated. Suspect UTI over diuretic therapy likely main contributer to dehydration, hyponatremia (although Na ranged from 125-133 last month as volume fluctuated). She  had been stable on her OP diuretic regimen for several months. This has been well-established and monitored by the HF team.  -- Continue IVF hydration for now -- Check BNP -- Continue to monitor strict I/Os, daily weights -- TED hose, CPAP, BP control -- Restart sliding-scale torsemide once Na, renal function back to baseline -- Hold on metolazone for now- would have patient follow-up w/ the HF clinic within 7 days of discharge for further recs.  5. Permanent atrial fibrillation Rate-controlled -- Continue amiodarone, carvedilol -- Continue Coumadin (see below)   Plan 6. Subtherapeutic INR 1.8 yesterday, therapeutic today at 2.1. -- Continue Coumadin, she will need Coumadin clinic follow-up post-discharge. She lives in [place], [place]. This can be done in our office if she wishes.   Electronic Signatures for Addendum Section:  Leonie Man (MD) (Signed Addendum 27-Jan-15 18:41)  Pt seen & examined this PM after MR. Gina Uballe, PA-C.  I agree with his history, findings, exam & recommendations. Frail elderly woman with 6 admissions in last few months, has chronic Diastolic HF on SS Torsemide (etc) - admitted with UTI & dehydration -- A on CRF.   I agree with conservative Rx of gentle hydration for HypoNatremia.  Will need diuretic basal regimen restarted once UTI & HypoNa+ resolved.  Agree with consideration for Hospice - reaching ESRD, chronic HF etc with multiple re-hospitalizatoins.   Otherwise -  no new recommendations beyond listed above.  ROV with CHF clinic upon d/c - to determine timing of re-instituting standing regimen.   Electronic Signatures: Meriel Pica (PA-C)   (Signed 27-Jan-15 11:22)  Authored: General Aspect/Present Illness, History and Physical Exam, Review of System, Home Medications, Labs, EKG , Radiology, Allergies, Vital Signs/Nurse's Notes, Impression/Plan Leonie Man (MD)  (Signed 27-Jan-15 18:41)  Co-Signer: General Aspect/Present Illness, History and Physical Exam, Review of System, Home Medications, Labs, EKG , Radiology, Allergies, Vital Signs/Nurse's Notes, Impression/Plan   Last Updated: 27-Jan-15 18:41 by Leonie Man (MD)

## 2015-02-25 NOTE — H&P (Signed)
PATIENT NAME:  Gina, Ashley MR#:  [id number] DATE OF BIRTH:  1927/08/18  DATE OF ADMISSION:  11/29/2013  PRIMARY CARE PHYSICIAN:  Lucas Mallow, MD at [facility] in [place].   CARDIOLOGIST:  Shaune Pascal. Bensimhon, MD  REQUESTING PHYSICIAN:  Ahmed Prima, MD  CHIEF COMPLAINT: Severe weakness and dehydration.   HISTORY OF PRESENT ILLNESS: The patient is an 81 year old female with a known history of CHF, chronic kidney disease. She is being admitted for severe hyponatremia with a sodium of 117 with weakness and feeling of dehydration. The patient's daughter reports the patient was at [facility] about mid December and she was discharged on 12/19 after CHF exacerbation when she retained about 9 to 10 pounds of fluid and she has been taking about 80 mg of Lasix twice a day and additional as needed to get her diuresed well but for the last few weeks the patient has been feeling really weak and tired. The patient was not taking Lasix.  She was taking actually torsemide as per her daughter and she was given metolazone in addition on Monday and Friday as needed.  Normally, she would eat and walk around in the home with a walker but for the last few days she has been having even difficulty standing up and her overall health has seemed to be getting worse for which she decided to come to the Emergency Department. While in the ED, her sodium was found to be 117. She normally goes to [facility] but for some reason she decided to come here as she feels this is close to her home. The patient denies any nausea, vomiting, diarrhea but does report poor p.o. intake. The patient just reports that she does not have any energy to do anything at this point.   PAST MEDICAL HISTORY: 1.  History of CHF.   2.  Hypertension.  3.  GERD. 4.  Depression.  5.  Atrial fibrillation/flutter.  6.  Anemia.  7.  Diabetes.  8.  History of CKD.    ALLERGIES: MORPHINE, PENICILLIN, REMERON AND  TUNA.   MEDICATIONS AT HOME: 1.  Tylenol 650 mg p.o. every 6 hours as needed.   2.  Allopurinol 100 mg p.o. daily.  3.  Alprazolam 0.25 mg 1/2 tablet p.o. every 8 hours as needed.  4.  Amiodarone 200 mg p.o. daily.  5.  Buspirone 5 mg p.o. b.i.d.  6.  Coreg 12.5 mg p.o. b.i.d.  7.  Colace 100 mg p.o. daily.  8.  Flonase 1 spray intranasally daily.  9.  Guaifenesin 600 mg p.o. daily.  10.  Insulin Aspart sliding scale as needed.  11.  Insulin Lantus 5 units subcu daily.  12.  Metolazone 2.5 mg p.o. every Monday and Friday as needed.  13.  MiraLax twice a day.  14.  Mupirocin 2% topically to affected area twice a day.  15.  Nitroglycerin 0.4 mg sublingual every 5 minutes as needed.  16.  Omeprazole 40 mg p.o. daily.  17.  Oxycodone 5 mg p.o. every 6 hours as needed.  18.  Potassium chloride 20 mEq p.o. daily.  19.  Torsemide 20 mg if weight is above 180.  20.  Trazodone 50 mg p.o. at bedtime.  21.  Warfarin 5 mg 1/2 tablet p.o. daily.  22.  Zofran 4 mg p.o. q.6 hours needed.   SOCIAL HISTORY: No smoking, alcohol or IV drugs of abuse.   FAMILY HISTORY: Positive for coronary artery disease in the  mother.   REVIEW OF SYSTEMS: CONSTITUTIONAL: No fever. Positive fatigue and extreme weakness. No weight gain or weight loss. Looks like she is about close to where she normally weighs, about 180 pounds.  EYES: No blurred or double vision.  ENT: No tinnitus or ear pain.  RESPIRATORY: No cough, wheezing, hemoptysis.  CARDIOVASCULAR: No chest pain, orthopnea, edema.  GASTROINTESTINAL: No vomiting, diarrhea. Positive for poor p.o. intake.  GENITOURINARY: No dysuria or hematuria.  ENDOCRINE: No polyuria or nocturia.  HEMATOLOGY: No anemia or easy bruising.  SKIN: No rash or lesion.  MUSCULOSKELETAL: No arthritis or muscle cramp.  NEUROLOGIC: No tingling or numbness. Positive for extreme weakness.  PSYCHIATRIC: No history of anxiety. Positive for depression.   PHYSICAL EXAMINATION: VITAL  SIGNS: Temperature 99, heart rate 78 per minute, respirations 18 per minute, blood pressure 126/60 mmHg, she is saturating 93% on room air. GENERAL: The patient is an 81 year old female laying in bed comfortably without any acute distress.  EYES: Pupils equal, round, reactive to light and accommodation. No scleral icterus. Extraocular muscles intact.  HEAD:  Normocephalic, atraumatic.  Oropharynx and nasopharynx clear.   NECK: Supple. No jugular venous distention. No thyromegaly or adenopathy noted.   LUNGS: Clear to auscultation bilaterally. No wheezing, rales, rhonchi or crepitation.  CARDIOVASCULAR: Irregularly irregular heart sounds, systolic ejection murmur present at the apical region.  ABDOMEN: Soft, nontender, nondistended. Bowel sounds present. No organomegaly or mass.  EXTREMITIES: No pedal edema, cyanosis or clubbing.  NEUROLOGIC: Nonfocal examination. Cranial nerves II through XII intact. Muscle strength 5 out of 5.  EXTREMITIES:  Sensation intact. Gait not checked.  PSYCHIATRIC: The patient seems alert and oriented x 3 but somewhat slow to respond. She knew that she is at [facility] and she knew her birth date correctly. She also knew that she was in [place] and she could tell me current president's name, Elyn Peers.  SKIN: No obvious rash, lesion or ulcer.   LABORATORY, DIAGNOSTIC AND RADIOLOGICAL DATA:   1.  Sodium 117, potassium 3.9, chloride 75, CO2 of 38, BUN 19.3, creatinine 3.22. Normal first set of cardiac enzymes. Normal CBC except platelet count of 87. UA showed 1+ bacteria, 123 WBCs, 3+ leuk esterase, WBCs in clumps.  2.  Chest x-ray in the ED showed cardiomegaly and right basilar atelectasis.  3.  EKG shows atrial fibrillation with a rate of 77 beats per minute, no ST-T changes, right bundle branch block.   IMPRESSION AND PLAN: 1.  Weakness, likely multifactorial with underlying worsening chronic kidney disease and congestive heart failure.  This can also be  due to severe hyponatremia.  Will consult nephrology and cardiology.  Will get physical and occupational therapy evaluation and management as she may need placement. Daughter is in agreement with the same.  2.  Severe hyponatremia could be due to overdiuresis from her last discharge as her daughter is very carefully monitoring this. She has advanced home health services following her at home. For now, we will start her on IV hydration, hold her diuretics and monitor her closely.  3.  Chronic kidney disease stage 4 to 5.  As per her daughter, she is not a hemodialysis candidate per her previous conversation with her physician while at [facility] and [place]. We will consult nephrology here.  4.  History of congestive heart failure. We will consult cardiology. She normally follows with Dr. Haroldine Laws. 5.  Possible urinary tract infection based on urinalysis. Will get a urine culture and sensitivity. Start Levaquin at this  time.  6.  CODE STATUS: Full code. Daughter is in agreement with consulting palliative care. Will consult palliative care although daughter is not willing to start her on hospice services yet.   TOTAL TIME TAKING CARE OF THIS PATIENT: 45 minutes.   ____________________________ Lucina Mellow. Manuella Ghazi, MD vss:cs D: 11/29/2013 17:48:32 ET T: 11/29/2013 18:43:05 ET JOB#: [id number]  cc: Paul Torpey S. Manuella Ghazi, MD, <Dictator> Lucas Mallow, MD Shaune Pascal. Bensimhon, MD Remer Macho MD ELECTRONICALLY SIGNED 11/30/2013 13:45

## 2015-02-25 NOTE — Consult Note (Signed)
   Comments   I had a lengthy meeting with pt and her daughter, Jackelyn Poling. Pt says that she wants daughter, Jackelyn Poling, to be her HCPOA. Will ask chaplain to come and complete documentation.  spoke with pt about code status and pt wants to be a DNR. Daughter agrees. Order entered and out-of-facility DNR completed and placed in chart.  has been hospitalized 6 times/6 months. She says that she wants to stay home with her daughter and does not like recurrent hospitalizations. She also expresses fear that her daughter is "wearing out" caring for her. Both pt and her daughter are adamant that pt will never go to a SNF. I discussed hospice with them and they are in agreement with this. Hospice screen ordered.  Dx: Cardiomyopathy         Secondary Dx: CKD IV  Electronic Signatures: Tayjah Lobdell, Izora Gala (MD)  (Signed 27-Jan-15 12:39)  Authored: Palliative Care   Last Updated: 27-Jan-15 12:39 by Davion Flannery, Izora Gala (MD)

## 2015-02-25 NOTE — Consult Note (Signed)
Brief Consult Note: Diagnosis: Hip bursitis R>L.   Patient was seen by consultant.   Recommend to proceed with surgery or procedure.   Orders entered.   Comments: 81 year old female admitted with multiple medical problems also complains of bilateral hip pain R>L for several weeks.  No history of  fall or injury.  history of right total knee replacement and open reduction and internal fixation for fracture above knee.    Exam:  Tender both hip bursae R>L.  range of motion good both hips.  Back non tender.  circulation/sensation/motor function good distally. skin intact.   X-rays: No osteoarthritis of right hip.  No calcifications noted.   Imp:  Bilateral hip bursitis.  Rx:  Injected right hip bursa with xylocaine, marcaine, Kenalog        Ice        Watch glucose for elevation following cortisone injection.  Electronic Signatures: Park Breed (MD)  (Signed 29-Jan-15 12:57)  Authored: Brief Consult Note   Last Updated: 29-Jan-15 12:57 by Park Breed (MD)

## 2015-02-25 NOTE — Discharge Summary (Signed)
PATIENT NAME:  Gina, Ashley MR#:  [id number] DATE OF BIRTH:  Mar 13, 1927  DATE OF ADMISSION:  11/29/2013 DATE OF DISCHARGE:  12/02/2013  DISCHARGE DIAGNOSES: 1.  Hyponatremia due to diuretic use.  2.  Acute systolic congestive heart failure.  3.  Hip pain bursitis, status post steroid injection in the bursa.  4.  Urinary tract infection. 5.  Acute on chronic kidney disease stage IV,  6.  Altered mental status on admission due to hyponatremia and urinary tract infection.   CONDITION ON DISCHARGE: Stable.   CODE STATUS: NO CODE, DO NOT RESUSCITATE.   MEDICATIONS ON DISCHARGE: 1.  Allopurinol 100 mg oral tablet once a day.  2.  Alprazolam 0.25 mg oral tablet, take 1/2 tablet orally every 8 hours as needed.  3.  Amlodipine 200 mg oral tablet once a day.  4.  Buspirone 5 mg oral tablet 2 times a day.  5.  Carvedilol 12.5 mg oral 2 times a day.  6.  Flonase 50 mcg nasal spray once a day. 7.  Guaifenesin 600 mg oral tablet extended release once a day. 8.  Metolazone 2.5 mg oral tablet once a day.  9.  Nitroglycerin 0.4 mg sublingual tablet every 5 minutes as needed.  10.  Omeprazole 40 mg delayed-release capsule once a day.  11.  Zofran 4 mg oral tablet every 6 hours as needed.  12.  MiraLax oral powder for reconstitution.  13.  Trazodone 50 mg oral tablet 1 tablet oral once a day.  14.  Warfarin 5 mg oral tablet, take 1/2 tablet once a day.  15.  Insulin 5 units subcutaneous once a day.  16.  Insulin Aspart subcutaneous sliding scale.  17.  Oxycodone 5 mg oral capsule every 6 hours as needed.  18.  Levofloxacin 250 mg every 48 hours for 3 days.   DIET ON DISCHARGE: Regular diet, consistency regular.   ACTIVITY: As tolerated.   TIMEFRAME TO FOLLOW-UP: Within 2 to 4 weeks.   Advised to discharge home with hospice services.   HISTORY OF PRESENT ILLNESS: An 81 year old female with known history of CHF, and chronic kidney disease, was admitted for severe hyponatremia with sodium  level of 117 with weakness and feeling dehydrated. The patient's doctor reported that she was at [facility] about mid December.  She was discharged on the 19th of December from there after CHF exacerbation when she gained about 9 to 10 pounds of fluid. She was taking 18 mg Lasix twice a day. For the last few days, she was feeling really weak and tired and was not taking Lasix. She was taking actually torsemide as per her daughter. She was given metolazone in addition on Monday and Friday as needed for extra diuresis.  The last few days she was having even difficult standing up on her own or even eating on her own. Sodium level found on presentation  was 117.   HOSPITAL COURSE AND STAY:  1.  Hyponatremia which was most likely due to over diuresis. We gave her IV fluid with normal saline. She responded nicely to that. Then she was noted to be having fluid overload and so we had to stop her IV fluids and we just kept her on fluid restriction. Her sodium level stayed around 125 to 126. We also tried one dose to help with this volume overload and hyponatremia which just helped a little bit, but overall she improve physically and mentally a lot as per family when sodium level improved from  117 to 125 and she started eating, following up with family properly on conversation and so family appreciated the improvement. Because of her overall sick condition and multiple medical issues and recurrent admissions in the hospitals, we called Palliative Care consult. After they had a discussion with the family, the family signed up for hospice services at home. I explained to the daughter in detail that even if sodium level goes to 130, it was very likely to fall again and as she accepted her to go home with hospice services, there was no meaning on keep chasing sodium level in the hospital, but the patient's overall status and mental level were significantly improved and satisfactory to the family, so advised to  discharge her home and daughter agreed with the plan.  2.  Weakness and metabolic encephalopathy.  These were both most likely due to a combination of UTI and hyponatremia. That improve satisfactorily with treatment for both.  3.  Acute renal failure worsening in the hospital with chronic kidney disease stage IV. She was not a hemodialysis candidate as per previous conversation and hospital admissions with physician at [facility] Nephrology consult was called in, and she had a little bit of improvement in her kidney function, but remained stable afterwards at stage IV.  4.  Congestive heart failure. She has a history of chronic CHF and with IV fluids in the hospital for hyponatremia, she had acute worsening of systolic heart failure, so we had to stop IV fluid. We tried once with somewhat improvement in the symptoms and then overall remained stable.  5.  Urinary tract infection. We started her on Levaquin. The patient had fairly good response to that and so we discharged her home on Levaquin.   CONSULTATIONS IN THE HOSPITAL: Nephrology consult with Dr. Candiss Norse. Palliative consult with Dr. Izora Gala Phifer. Orthopedic consult was called in because the patient had complained of pain in left hip. Dr. Earnestine Leys saw the patient and gave steroid injection, interarticular for possible bursitis, and the patient had significant improvement in pain after that.   IMPORTANT LABORATORY RESULTS IN THE HOSPITAL: White cell count 7.1, hemoglobin 12.5 and platelet count 87 on admission. Creatinine was 3.22 and sodium was 117. Troponin was negative, less than 0.02. Urinalysis was positive with 123 WBCs and 3+ leukocyte esterase. Urine culture was positive, which is reported later on after the patient is discharged with more than 100,000 CFU Klebsiella pneumoniae, which was resistant to all routine antibiotics. Chest x-ray, portable, on admission: Cardiomegaly, and right basilar atelectasis. INR level was 2.  With gradual  followup, on 28th of January her creatinine was 2.67 and sodium was 127. Hip x-rays did not show any fracture.  Sodium level was 126 on the 29th of January and creatinine was 2.93.   TOTAL TIME SPENT ON THIS DISCHARGE: 40 minutes   ____________________________ Ceasar Lund. Anselm Jungling, MD [id number] D: 12/06/2013 23:25:35 ET T: 12/07/2013 07:38:06 ET JOB#: [id number]  cc: Ceasar Lund. Anselm Jungling, MD, <Dictator> Vaughan Basta MD ELECTRONICALLY SIGNED 12/25/2013 8:30

## 2016-01-30 ENCOUNTER — Telehealth: Payer: Self-pay | Admitting: Cardiovascular Disease

## 2016-01-30 NOTE — Telephone Encounter (Signed)
Received records request [facility] [place], forwarded to [facility] for processing.

## 2016-07-22 ENCOUNTER — Encounter (INDEPENDENT_AMBULATORY_CARE_PROVIDER_SITE_OTHER): Payer: Self-pay | Admitting: *Deleted
